# Patient Record
Sex: Male | Born: 1960 | Race: White | Hispanic: No | Smoking: Current every day smoker
Health system: Southern US, Community
[De-identification: ages and names within clinical notes are randomized; demographics above are authoritative.]

## PROBLEM LIST (undated history)

## (undated) DIAGNOSIS — T7840XA Allergy, unspecified, initial encounter: Secondary | ICD-10-CM

## (undated) DIAGNOSIS — I739 Peripheral vascular disease, unspecified: Secondary | ICD-10-CM

## (undated) DIAGNOSIS — J449 Chronic obstructive pulmonary disease, unspecified: Secondary | ICD-10-CM

## (undated) DIAGNOSIS — E785 Hyperlipidemia, unspecified: Secondary | ICD-10-CM

## (undated) DIAGNOSIS — K219 Gastro-esophageal reflux disease without esophagitis: Secondary | ICD-10-CM

## (undated) DIAGNOSIS — K649 Unspecified hemorrhoids: Secondary | ICD-10-CM

## (undated) DIAGNOSIS — F1990 Other psychoactive substance use, unspecified, uncomplicated: Secondary | ICD-10-CM

## (undated) DIAGNOSIS — I639 Cerebral infarction, unspecified: Secondary | ICD-10-CM

## (undated) DIAGNOSIS — IMO0001 Reserved for inherently not codable concepts without codable children: Secondary | ICD-10-CM

## (undated) DIAGNOSIS — J189 Pneumonia, unspecified organism: Secondary | ICD-10-CM

## (undated) DIAGNOSIS — M1990 Unspecified osteoarthritis, unspecified site: Secondary | ICD-10-CM

## (undated) DIAGNOSIS — T8859XA Other complications of anesthesia, initial encounter: Secondary | ICD-10-CM

## (undated) DIAGNOSIS — Z9049 Acquired absence of other specified parts of digestive tract: Secondary | ICD-10-CM

## (undated) DIAGNOSIS — IMO0002 Reserved for concepts with insufficient information to code with codable children: Secondary | ICD-10-CM

## (undated) DIAGNOSIS — M47816 Spondylosis without myelopathy or radiculopathy, lumbar region: Secondary | ICD-10-CM

## (undated) DIAGNOSIS — F1910 Other psychoactive substance abuse, uncomplicated: Secondary | ICD-10-CM

## (undated) DIAGNOSIS — C210 Malignant neoplasm of anus, unspecified: Secondary | ICD-10-CM

## (undated) DIAGNOSIS — I10 Essential (primary) hypertension: Secondary | ICD-10-CM

## (undated) DIAGNOSIS — F419 Anxiety disorder, unspecified: Secondary | ICD-10-CM

## (undated) HISTORY — DX: Spondylosis without myelopathy or radiculopathy, lumbar region: M47.816

## (undated) HISTORY — DX: Unspecified hemorrhoids: K64.9

## (undated) HISTORY — DX: Allergy, unspecified, initial encounter: T78.40XA

## (undated) HISTORY — DX: Malignant neoplasm of anus, unspecified: C21.0

## (undated) HISTORY — PX: APPENDECTOMY: SHX54

---

## 2000-01-22 ENCOUNTER — Emergency Department (HOSPITAL_COMMUNITY): Admission: EM | Admit: 2000-01-22 | Discharge: 2000-01-22 | Payer: Self-pay | Admitting: Emergency Medicine

## 2000-07-10 ENCOUNTER — Emergency Department (HOSPITAL_COMMUNITY): Admission: EM | Admit: 2000-07-10 | Discharge: 2000-07-10 | Payer: Self-pay | Admitting: Emergency Medicine

## 2000-07-13 ENCOUNTER — Ambulatory Visit (HOSPITAL_COMMUNITY): Admission: RE | Admit: 2000-07-13 | Discharge: 2000-07-13 | Payer: Self-pay | Admitting: Gynecology

## 2001-09-29 ENCOUNTER — Emergency Department (HOSPITAL_COMMUNITY): Admission: EM | Admit: 2001-09-29 | Discharge: 2001-09-29 | Payer: Self-pay

## 2002-07-15 ENCOUNTER — Encounter: Payer: Self-pay | Admitting: Emergency Medicine

## 2002-07-15 ENCOUNTER — Emergency Department (HOSPITAL_COMMUNITY): Admission: EM | Admit: 2002-07-15 | Discharge: 2002-07-15 | Payer: Self-pay | Admitting: Emergency Medicine

## 2002-11-25 ENCOUNTER — Emergency Department (HOSPITAL_COMMUNITY): Admission: EM | Admit: 2002-11-25 | Discharge: 2002-11-25 | Payer: Self-pay | Admitting: Emergency Medicine

## 2004-08-08 ENCOUNTER — Emergency Department (HOSPITAL_COMMUNITY): Admission: EM | Admit: 2004-08-08 | Discharge: 2004-08-08 | Payer: Self-pay | Admitting: Emergency Medicine

## 2004-08-19 ENCOUNTER — Emergency Department (HOSPITAL_COMMUNITY): Admission: EM | Admit: 2004-08-19 | Discharge: 2004-08-19 | Payer: Self-pay | Admitting: Family Medicine

## 2005-04-17 ENCOUNTER — Emergency Department (HOSPITAL_COMMUNITY): Admission: EM | Admit: 2005-04-17 | Discharge: 2005-04-17 | Payer: Self-pay | Admitting: Family Medicine

## 2005-09-12 ENCOUNTER — Emergency Department (HOSPITAL_COMMUNITY): Admission: EM | Admit: 2005-09-12 | Discharge: 2005-09-12 | Payer: Self-pay | Admitting: Family Medicine

## 2007-02-10 ENCOUNTER — Emergency Department (HOSPITAL_COMMUNITY): Admission: EM | Admit: 2007-02-10 | Discharge: 2007-02-10 | Payer: Self-pay | Admitting: Family Medicine

## 2007-05-05 ENCOUNTER — Ambulatory Visit: Payer: Self-pay | Admitting: Internal Medicine

## 2007-06-24 ENCOUNTER — Emergency Department (HOSPITAL_COMMUNITY): Admission: EM | Admit: 2007-06-24 | Discharge: 2007-06-24 | Payer: Self-pay | Admitting: Emergency Medicine

## 2007-07-04 ENCOUNTER — Emergency Department (HOSPITAL_COMMUNITY): Admission: EM | Admit: 2007-07-04 | Discharge: 2007-07-04 | Payer: Self-pay | Admitting: Family Medicine

## 2007-07-04 ENCOUNTER — Ambulatory Visit (HOSPITAL_COMMUNITY): Admission: RE | Admit: 2007-07-04 | Discharge: 2007-07-04 | Payer: Self-pay | Admitting: Family Medicine

## 2009-06-07 ENCOUNTER — Emergency Department (HOSPITAL_COMMUNITY): Admission: EM | Admit: 2009-06-07 | Discharge: 2009-06-07 | Payer: Self-pay | Admitting: Emergency Medicine

## 2010-05-05 ENCOUNTER — Emergency Department (HOSPITAL_COMMUNITY): Admission: EM | Admit: 2010-05-05 | Discharge: 2010-05-05 | Payer: Self-pay | Admitting: Family Medicine

## 2010-05-06 ENCOUNTER — Emergency Department (HOSPITAL_COMMUNITY): Admission: EM | Admit: 2010-05-06 | Discharge: 2010-05-06 | Payer: Self-pay | Admitting: Emergency Medicine

## 2010-10-05 ENCOUNTER — Emergency Department (HOSPITAL_COMMUNITY)
Admission: EM | Admit: 2010-10-05 | Discharge: 2010-10-05 | Payer: Self-pay | Source: Home / Self Care | Admitting: Emergency Medicine

## 2010-10-09 DIAGNOSIS — C210 Malignant neoplasm of anus, unspecified: Secondary | ICD-10-CM | POA: Insufficient documentation

## 2011-04-30 ENCOUNTER — Inpatient Hospital Stay (INDEPENDENT_AMBULATORY_CARE_PROVIDER_SITE_OTHER)
Admission: RE | Admit: 2011-04-30 | Discharge: 2011-04-30 | Disposition: A | Discharge status: Home / Self Care | Payer: Self-pay | Source: Ambulatory Visit | Attending: Family Medicine | Admitting: Family Medicine

## 2011-04-30 DIAGNOSIS — K4090 Unilateral inguinal hernia, without obstruction or gangrene, not specified as recurrent: Secondary | ICD-10-CM

## 2011-05-13 ENCOUNTER — Ambulatory Visit (INDEPENDENT_AMBULATORY_CARE_PROVIDER_SITE_OTHER): Payer: Self-pay | Admitting: General Surgery

## 2011-07-20 ENCOUNTER — Emergency Department (HOSPITAL_COMMUNITY)
Admission: EM | Admit: 2011-07-20 | Discharge: 2011-07-20 | Discharge status: Against Medical Advice | Payer: Self-pay | Attending: Emergency Medicine | Admitting: Emergency Medicine

## 2011-07-20 DIAGNOSIS — R109 Unspecified abdominal pain: Secondary | ICD-10-CM | POA: Insufficient documentation

## 2011-08-28 ENCOUNTER — Emergency Department (HOSPITAL_COMMUNITY): Payer: Self-pay

## 2011-08-28 ENCOUNTER — Encounter (HOSPITAL_COMMUNITY): Payer: Self-pay

## 2011-08-28 ENCOUNTER — Other Ambulatory Visit: Payer: Self-pay

## 2011-08-28 ENCOUNTER — Emergency Department (HOSPITAL_COMMUNITY)
Admission: EM | Admit: 2011-08-28 | Discharge: 2011-08-28 | Disposition: A | Discharge status: Home / Self Care | Payer: Self-pay | Attending: Emergency Medicine | Admitting: Emergency Medicine

## 2011-08-28 DIAGNOSIS — K649 Unspecified hemorrhoids: Secondary | ICD-10-CM | POA: Insufficient documentation

## 2011-08-28 DIAGNOSIS — K4090 Unilateral inguinal hernia, without obstruction or gangrene, not specified as recurrent: Secondary | ICD-10-CM | POA: Insufficient documentation

## 2011-08-28 DIAGNOSIS — R1013 Epigastric pain: Secondary | ICD-10-CM | POA: Insufficient documentation

## 2011-08-28 DIAGNOSIS — K6289 Other specified diseases of anus and rectum: Secondary | ICD-10-CM | POA: Insufficient documentation

## 2011-08-28 DIAGNOSIS — R079 Chest pain, unspecified: Secondary | ICD-10-CM | POA: Insufficient documentation

## 2011-08-28 LAB — URINALYSIS, ROUTINE W REFLEX MICROSCOPIC
Bilirubin Urine: NEGATIVE
Leukocytes, UA: NEGATIVE
Nitrite: NEGATIVE
Protein, ur: NEGATIVE mg/dL
Specific Gravity, Urine: 1.009 (ref 1.005–1.030)
Urobilinogen, UA: 0.2 mg/dL (ref 0.0–1.0)

## 2011-08-28 LAB — COMPREHENSIVE METABOLIC PANEL
AST: 14 U/L (ref 0–37)
BUN: 10 mg/dL (ref 6–23)
Calcium: 9.6 mg/dL (ref 8.4–10.5)
Creatinine, Ser: 0.89 mg/dL (ref 0.50–1.35)
Glucose, Bld: 92 mg/dL (ref 70–99)
Total Bilirubin: 0.3 mg/dL (ref 0.3–1.2)

## 2011-08-28 LAB — DIFFERENTIAL
Basophils Absolute: 0.1 10*3/uL (ref 0.0–0.1)
Eosinophils Absolute: 0.3 10*3/uL (ref 0.0–0.7)
Lymphs Abs: 3.4 10*3/uL (ref 0.7–4.0)
Monocytes Absolute: 0.9 10*3/uL (ref 0.1–1.0)
Neutro Abs: 11.4 10*3/uL — ABNORMAL HIGH (ref 1.7–7.7)
Neutrophils Relative %: 71 % (ref 43–77)

## 2011-08-28 LAB — CBC
Hemoglobin: 16.8 g/dL (ref 13.0–17.0)
MCH: 32.8 pg (ref 26.0–34.0)
MCHC: 36 g/dL (ref 30.0–36.0)
MCV: 91.2 fL (ref 78.0–100.0)
Platelets: 204 10*3/uL (ref 150–400)
RDW: 12.9 % (ref 11.5–15.5)

## 2011-08-28 LAB — POCT I-STAT TROPONIN I: Troponin i, poc: 0 ng/mL (ref 0.00–0.08)

## 2011-08-28 LAB — LIPASE, BLOOD: Lipase: 54 U/L (ref 11–59)

## 2011-08-28 MED ORDER — SODIUM CHLORIDE 0.9 % IV SOLN
Freq: Once | INTRAVENOUS | Status: AC
  Administered 2011-08-28: 12:00:00 via INTRAVENOUS

## 2011-08-28 MED ORDER — ONDANSETRON HCL 4 MG/2ML IJ SOLN
INTRAMUSCULAR | Status: AC
  Administered 2011-08-28: 4 mg via INTRAMUSCULAR
  Filled 2011-08-28: qty 2

## 2011-08-28 MED ORDER — FAMOTIDINE IN NACL 20-0.9 MG/50ML-% IV SOLN
INTRAVENOUS | Status: AC
  Filled 2011-08-28: qty 50

## 2011-08-28 MED ORDER — HYDROMORPHONE HCL PF 1 MG/ML IJ SOLN
INTRAMUSCULAR | Status: AC
  Filled 2011-08-28: qty 1

## 2011-08-28 MED ORDER — DIBUCAINE 1 % RE OINT
TOPICAL_OINTMENT | Freq: Four times a day (QID) | RECTAL | Status: DC | PRN
  Administered 2011-08-28: 14:00:00 via RECTAL
  Filled 2011-08-28: qty 28

## 2011-08-28 MED ORDER — ONDANSETRON HCL 4 MG/2ML IJ SOLN
4.0000 mg | Freq: Once | INTRAMUSCULAR | Status: AC
  Administered 2011-08-28: 4 mg via INTRAMUSCULAR

## 2011-08-28 MED ORDER — HYDROMORPHONE HCL PF 1 MG/ML IJ SOLN
1.0000 mg | Freq: Once | INTRAMUSCULAR | Status: AC
  Administered 2011-08-28: 1 mg via INTRAVENOUS

## 2011-08-28 MED ORDER — FAMOTIDINE IN NACL 20-0.9 MG/50ML-% IV SOLN
20.0000 mg | Freq: Once | INTRAVENOUS | Status: AC
  Administered 2011-08-28: 20 mg via INTRAVENOUS
  Filled 2011-08-28: qty 50

## 2011-08-28 MED ORDER — HYDROCODONE-ACETAMINOPHEN 5-325 MG PO TABS: 1.0000 | ORAL_TABLET | Freq: Four times a day (QID) | ORAL | Status: AC | PRN

## 2011-08-28 MED ORDER — STARCH 51 % RE SUPP: 1.0000 | Freq: Two times a day (BID) | RECTAL | Status: DC | PRN

## 2011-08-28 MED ORDER — DOXYCYCLINE HYCLATE 100 MG PO CAPS: 100.0000 mg | ORAL_CAPSULE | Freq: Two times a day (BID) | ORAL | Status: AC

## 2011-08-28 MED ORDER — IOHEXOL 300 MG/ML  SOLN: 100.0000 mL | Freq: Once | INTRAMUSCULAR | Status: DC | PRN

## 2011-08-28 NOTE — ED Notes (Signed)
Pt has finished his ct prep. Ct has been called.

## 2011-08-28 NOTE — ED Notes (Signed)
Pt here with epigastric pain for the past 2 days states that he has been straining while using the restroom and thinks that the pain has been coming from this.  Pt ambulatory around unit.

## 2011-08-28 NOTE — ED Provider Notes (Signed)
The patient was initially sent to the CDU while awaiting a CT scan of the abdomen and pelvis for further evaluation of a possible perirectal abscess. He has had rectal pain for the last month it has been increasing over the last couple of days. A CT scan was reviewed and shows no evidence of a perirectal abscess.   Ct Abdomen Pelvis W Contrast  08/28/2011  *RADIOLOGY REPORT*  Clinical Data: Difficulty with bowel movements, evaluate for possible perirectal abscess  CT ABDOMEN AND PELVIS WITH CONTRAST  Technique:  Multidetector CT imaging of the abdomen and pelvis was performed following the standard protocol during bolus administration of intravenous contrast.  Contrast:  100 ml Omnipaque-300  Comparison: None  Findings: There are vague opacities at the right lung base which may represent scarring.  Small foci of pneumonia cannot be excluded.  The left lung base is clear.  The liver enhances with no focal abnormality and no ductal dilatation is seen.  No calcified gallstones are noted.  The pancreas is normal in size and the pancreatic duct is not dilated.  The adrenal glands and spleen are unremarkable.  The stomach is moderately distended with fluid and is unremarkable.  The kidneys enhance with no calculus or mass and no hydronephrosis is noted.  The abdominal aorta is normal in caliber.  No adenopathy is seen.  The urinary bladder is moderately urine distended with no abnormality noted.  The prostate is normal in size.  No perirectal abscess is noted.  The colon is largely decompressed.  No colonic abnormality is seen.  The terminal ileum appears normal.  The appendix is not visualized.  No bony abnormality is seen.  There is degenerative disc disease at the L5 S1 level.  IMPRESSION:  1.  No perirectal abscess.  No abnormality of the colon is seen although the colon is diffusely decompressed. 2. Small patchy opacities within the right lower lobe may represent scarring, but foci of pneumonia cannot be  excluded.  Original Report Authenticated By: Joretta Bachelor, M.D.   Although the CT scan mentioned small patchy opacities that cannot be excluded as pneumonia, the patient has no fever, shortness of breath, or cough and I do not clinically suspect a pneumonia so he'll not be treated for this. Although there is no definitive perirectal abscess on CT scan, the patient's elevated white blood cell count with significant pain on rectal examination suggests that there could be a small abscess. He'll be treated with a course of doxycycline. The patient notes that he also has pain from a previously diagnosed inguinal hernia that he was seen for in August and is now able to see a general surgeon for this problem. The hernia only presents itself with maneuvers that increase his intra-abdominal pressure and there is no sign of incarceration. I will give him pain medication but advised that the surgeon is the appropriate provider for definitive treatment. He will be given Anusol suppositories and has been given instructions to use this as well as to became for his hemorrhoidal pain.  [place], [place] 08/28/11 1441

## 2011-08-28 NOTE — ED Notes (Signed)
Patient presents with tight, sharp, constant, epigastric/chest pain x 2 days.  Patient reports he's been straining during bowel movements and thinks this may have caused his pain.

## 2011-08-28 NOTE — ED Provider Notes (Signed)
Medical screening examination/treatment/procedure(s) were conducted as a shared visit with non-physician practitioner(s) and myself.  I personally evaluated the patient during the encounter.  Patient may have minimal perirectal abscess.  Rx doxycycline. no clinical evidence of pneumonia  Nat Christen, MD 08/28/11 1446

## 2011-09-05 ENCOUNTER — Encounter (HOSPITAL_COMMUNITY): Payer: Self-pay

## 2011-09-05 ENCOUNTER — Emergency Department (HOSPITAL_COMMUNITY)
Admission: EM | Admit: 2011-09-05 | Discharge: 2011-09-05 | Disposition: A | Discharge status: Home / Self Care | Payer: Self-pay | Attending: Emergency Medicine | Admitting: Emergency Medicine

## 2011-09-05 DIAGNOSIS — F17200 Nicotine dependence, unspecified, uncomplicated: Secondary | ICD-10-CM | POA: Insufficient documentation

## 2011-09-05 DIAGNOSIS — K648 Other hemorrhoids: Secondary | ICD-10-CM | POA: Insufficient documentation

## 2011-09-05 DIAGNOSIS — M549 Dorsalgia, unspecified: Secondary | ICD-10-CM | POA: Insufficient documentation

## 2011-09-05 DIAGNOSIS — R21 Rash and other nonspecific skin eruption: Secondary | ICD-10-CM | POA: Insufficient documentation

## 2011-09-05 DIAGNOSIS — K6289 Other specified diseases of anus and rectum: Secondary | ICD-10-CM | POA: Insufficient documentation

## 2011-09-05 DIAGNOSIS — Z79899 Other long term (current) drug therapy: Secondary | ICD-10-CM | POA: Insufficient documentation

## 2011-09-05 MED ORDER — DIPHENHYDRAMINE HCL 50 MG/ML IJ SOLN
25.0000 mg | Freq: Once | INTRAMUSCULAR | Status: AC
  Administered 2011-09-05: 25 mg via INTRAMUSCULAR
  Filled 2011-09-05: qty 1

## 2011-09-05 MED ORDER — HYDROCODONE-ACETAMINOPHEN 5-325 MG PO TABS: 1.0000 | ORAL_TABLET | Freq: Four times a day (QID) | ORAL | Status: DC | PRN

## 2011-09-05 MED ORDER — ANUSOL-HC 2.5 % RE CREA: TOPICAL_CREAM | RECTAL | Status: AC

## 2011-09-05 NOTE — ED Notes (Signed)
Pt here with burning in bottom, back pain, hernia pain, seen here Friday for same.

## 2011-09-05 NOTE — ED Provider Notes (Signed)
History     CSN: [id number]  Arrival date & time 09/05/11  77   First MD Initiated Contact with Patient 09/05/11 1541      Chief Complaint  Patient presents with  . Back Pain    (Consider location/radiation/quality/duration/timing/severity/associated sxs/prior treatment) Patient is a 50 y.o. male presenting with back pain. The history is provided by the patient (Patient complains of continued rectal pain. Patient has been diagnosed with internal hemorrhoids and referred to Gen. surgery. Patient has not been seen in general surgery. He had a CT scan of the abdomen and pelvis which was negative last week. Patient al).  Back Pain  This is a new problem. The current episode started more than 2 days ago. The problem occurs constantly. The problem has not changed since onset.The pain is associated with no known injury. Pain location: rectal pain. The pain is at a severity of 5/10. The pain is moderate. The pain is the same all the time. Pertinent negatives include no chest pain, no fever, no headaches and no abdominal pain. He has tried heat for the symptoms. The treatment provided mild relief.    Past Medical History  Diagnosis Date  . Inguinal hernia     Past Surgical History  Procedure Date  . Appendectomy     History reviewed. No pertinent family history.  History  Substance Use Topics  . Smoking status: Current Everyday Smoker  . Smokeless tobacco: Not on file  . Alcohol Use: Yes     occ      Review of Systems  Constitutional: Negative for fever and fatigue.  HENT: Negative for congestion, sinus pressure and ear discharge.   Eyes: Negative for discharge.  Respiratory: Negative for cough.   Cardiovascular: Negative for chest pain.  Gastrointestinal: Positive for rectal pain. Negative for abdominal pain and diarrhea.  Genitourinary: Negative for frequency and hematuria.  Musculoskeletal: Positive for back pain.  Skin: Positive for rash.  Neurological: Negative for  seizures and headaches.  Hematological: Negative.   Psychiatric/Behavioral: Negative for hallucinations.    Allergies  Aspirin and Morphine and related  Home Medications   Current Outpatient Rx  Name Route Sig Dispense Refill  . BC HEADACHE POWDER PO Oral Take 1 packet by mouth daily as needed. For headache    . DOXYCYCLINE HYCLATE 100 MG PO CAPS Oral Take 1 capsule (100 mg total) by mouth 2 (two) times daily. 14 capsule 0  . HYDROCODONE-ACETAMINOPHEN 5-325 MG PO TABS Oral Take 1-2 tablets by mouth every 6 (six) hours as needed for pain. 15 tablet 0  . HYDROCORTISONE 2.5 % RE CREA Rectal Place rectally 2 (two) times daily as needed. For hemorrhoids     . HYDROCORTISONE ACETATE 25 MG RE SUPP Rectal Place 25 mg rectally 2 (two) times daily. For pain     . ANUSOL-HC 2.5 % RE CREA  Apply rectally 2 times daily 1 Tube 1    Dispense as written.  Marland Kitchen HYDROCODONE-ACETAMINOPHEN 5-325 MG PO TABS Oral Take 1 tablet by mouth every 6 (six) hours as needed for pain. 30 tablet 0    BP 175/98  Pulse 95  Temp(Src) 98.1 F (36.7 C) (Oral)  Resp 20  SpO2 97%  Physical Exam  Constitutional: He is oriented to person, place, and time. He appears well-developed.  HENT:  Head: Normocephalic and atraumatic.  Eyes: Conjunctivae and EOM are normal. No scleral icterus.  Neck: Neck supple. No thyromegaly present.  Cardiovascular: Normal rate and regular rhythm.  Exam reveals  no gallop and no friction rub.   No murmur heard. Pulmonary/Chest: No stridor. He has no wheezes. He has no rales. He exhibits no tenderness.  Abdominal: He exhibits no distension. There is no tenderness. There is no rebound.  Genitourinary:       Rectal exam,  Tender internal hemm.  Musculoskeletal: Normal range of motion. He exhibits no edema.  Lymphadenopathy:    He has no cervical adenopathy.  Neurological: He is oriented to person, place, and time. Coordination normal.  Skin: Rash noted. There is erythema.       Allergic  dermatitis to chest.  Psychiatric: He has a normal mood and affect. His behavior is normal.    ED Course  Procedures (including critical care time)  Labs Reviewed - No data to display No results found.   1. Rectal pain       MDM  Internal hemm.  And allergic reaction to doxycyclne        Maudry Diego, MD 09/05/11 [phone]

## 2011-09-05 NOTE — ED Notes (Signed)
Pt complains of apin in bottom and body aches, sts recently diagnosed with hemmorrhoids. Also has a hernia per pt report.

## 2011-09-11 ENCOUNTER — Ambulatory Visit (INDEPENDENT_AMBULATORY_CARE_PROVIDER_SITE_OTHER): Payer: Self-pay | Admitting: General Surgery

## 2011-09-11 ENCOUNTER — Encounter (INDEPENDENT_AMBULATORY_CARE_PROVIDER_SITE_OTHER): Payer: Self-pay | Admitting: General Surgery

## 2011-09-11 VITALS — BP 162/118 | HR 84 | Temp 97.2°F | Resp 20 | Ht 71.0 in | Wt 167.5 lb

## 2011-09-11 DIAGNOSIS — K648 Other hemorrhoids: Secondary | ICD-10-CM | POA: Insufficient documentation

## 2011-09-11 MED ORDER — OXYCODONE-ACETAMINOPHEN 5-325 MG PO TABS: 1.0000 | ORAL_TABLET | Freq: Four times a day (QID) | ORAL | Status: DC | PRN

## 2011-09-11 NOTE — Progress Notes (Signed)
Subjective:     Patient ID: ADOLPHUS HANF, male   DOB: 06-21-1961, 51 y.o.   MRN: [id number]  HPI Patient believes one month hemorrhoid complaints. He has pain with bowel movements. He has prolapse of some tissue and some mild spotting of blood. He has been having some constipation lately.  Review of Systems     Objective:   Physical Exam External anal exam reveals no external hemorrhoids. There is no evidence of infection or abscess. There is no fissure. Digital rectal exam reveals a internal hemorrhoid over on the left side. Anoscopy was then done. This is somewhat painful to the patient. Hemorrhoid appeared to close to the anal verge for banding. I was able to do injection of sclerosing solution up above where it was sensate. He tolerated this well. There was no bleeding.    Assessment:     Inflamed internal hemorrhoid    Plan:     This was injected with sclerosing solution as described above. Advised him to drink any water to avoid constipation. I gave him a small prescription of Percocet. He tolerates this well but cannot take vicodin. I will see him back. This may require further office procedures. I strongly advised him to see a primary care MD about his BP. It is likely elevated now due to pain.

## 2011-09-15 ENCOUNTER — Ambulatory Visit (INDEPENDENT_AMBULATORY_CARE_PROVIDER_SITE_OTHER): Payer: Self-pay | Admitting: Surgery

## 2011-09-15 ENCOUNTER — Encounter (INDEPENDENT_AMBULATORY_CARE_PROVIDER_SITE_OTHER): Payer: Self-pay | Admitting: Surgery

## 2011-09-15 DIAGNOSIS — K6289 Other specified diseases of anus and rectum: Secondary | ICD-10-CM

## 2011-09-15 DIAGNOSIS — K648 Other hemorrhoids: Secondary | ICD-10-CM

## 2011-09-15 MED ORDER — HYDROCORTISONE ACETATE 25 MG RE SUPP: 25.0000 mg | Freq: Two times a day (BID) | RECTAL | Status: AC

## 2011-09-15 NOTE — Patient Instructions (Signed)
Try suppositories for 10 days.  If no relief, call office at [phone]. tmg

## 2011-09-15 NOTE — Progress Notes (Signed)
Visit Diagnoses: 1. Internal hemorrhoid   2. Rectal pain     HISTORY: The patient is a 51 year old male seen one week ago by my partner for rectal pain. He was diagnosed with internal hemorrhoids. He was treated with injection sclerotherapy. Patient returns today with persistent complaints of pain. He denies rectal bleeding. He did have some relief with suppositories. He has had no relief with topical creams. He denies any significant bleeding. Patient denies any prior rectal surgery.  PERTINENT REVIEW OF SYSTEMS: No significant bleeding. Persistent pain.  EXAM: HEENT: normocephalic; pupils equal and reactive; sclerae clear; dentition good; mucous membranes moist NECK:  symmetric on extension; no palpable anterior or posterior cervical lymphadenopathy; no supraclavicular masses; no tenderness GU:  External exam normal; no fissure, no fistula, no thrombosis; markedly tender right anterior anal canal; no mass; no blood EXT:  non-tender without edema; no deformity NEURO: no gross focal deficits; no sign of tremor   IMPRESSION: Anorectal pain, possible thrombosed hemorrhoidal vein, no evidence fissure or neoplasm  PLAN: Patient will begin Anusol-HC suppositories twice daily for 10 days. He did achieve a level of symptomatic relief from this treatment previously. He is so uncomfortable with the examination here in the office, then I think if he needs another examination and needs to be performed under anesthesia. Patient will contact us in 10-12 days and let us know about his progress. I have encouraged him to remain on stool softeners and drink plenty of fluids. I did renew a prescription for Percocet and gave him 20 tablets. I would not give him any further narcotics.  We will await the report from the patient about his progress and then make a decision regarding further therapy.  Earnstine Regal, MD, [facility]

## 2011-09-18 ENCOUNTER — Telehealth (INDEPENDENT_AMBULATORY_CARE_PROVIDER_SITE_OTHER): Payer: Self-pay

## 2011-09-18 NOTE — Telephone Encounter (Signed)
C/o rectal burning. Patient using baby wipes, creams and soaking his rectal area. He has been advised no more narcotics and is willing to be examined under anesthesia.   Please place the orders ASAP.

## 2011-09-19 ENCOUNTER — Telehealth (INDEPENDENT_AMBULATORY_CARE_PROVIDER_SITE_OTHER): Payer: Self-pay

## 2011-09-19 NOTE — Telephone Encounter (Signed)
Patient call back with rectal burning- asked for more pain medicine- follow up directions given to him same as yesterday- He was advised to go to the ER if he can not wait for our office to place orders.

## 2011-09-22 ENCOUNTER — Telehealth (INDEPENDENT_AMBULATORY_CARE_PROVIDER_SITE_OTHER): Payer: Self-pay

## 2011-09-22 ENCOUNTER — Other Ambulatory Visit (INDEPENDENT_AMBULATORY_CARE_PROVIDER_SITE_OTHER): Payer: Self-pay | Admitting: Surgery

## 2011-09-22 DIAGNOSIS — K6289 Other specified diseases of anus and rectum: Secondary | ICD-10-CM

## 2011-09-22 DIAGNOSIS — K648 Other hemorrhoids: Secondary | ICD-10-CM

## 2011-09-22 MED ORDER — OXYCODONE-ACETAMINOPHEN 5-325 MG PO TABS: 1.0000 | ORAL_TABLET | Freq: Four times a day (QID) | ORAL | Status: DC | PRN

## 2011-09-22 NOTE — Telephone Encounter (Signed)
Message left on voicemail for patient to return call for orders to be placed for examination under anesthesia.    On second phone call patient stated yes, please place orders as soon as possible.

## 2011-09-22 NOTE — Telephone Encounter (Signed)
Patient called wanting more pain medication. I told them there was note saying no more pain medication. Insisted I page Dr to see if some oxycodone could be prescribed.

## 2011-09-22 NOTE — Progress Notes (Signed)
Patient with persistent symptoms.  Desires to proceed with exam under anesthesia as discussed at his last office visit. tmg

## 2011-09-22 NOTE — Telephone Encounter (Signed)
Dr Harlow Asa oked the refill. Patient told it is at front desk to pick up. Also told patient that this will not be refilled again before surgery so he needed to stretch the medicine out as long as possible.

## 2011-10-07 ENCOUNTER — Telehealth (INDEPENDENT_AMBULATORY_CARE_PROVIDER_SITE_OTHER): Payer: Self-pay

## 2011-10-07 NOTE — Telephone Encounter (Signed)
Pt called requesting refill of narcotic pain med.. Per 09-22-11 note in system per Dr Harlow Asa no more narcotic rx refills until after surgery. Pt advised to try otc pain meds as directed on bottle. Pt to keep surgery date as planned.

## 2011-10-08 ENCOUNTER — Encounter (HOSPITAL_COMMUNITY): Payer: Self-pay | Admitting: Pharmacy Technician

## 2011-10-10 DIAGNOSIS — T7840XA Allergy, unspecified, initial encounter: Secondary | ICD-10-CM | POA: Insufficient documentation

## 2011-10-10 HISTORY — DX: Allergy, unspecified, initial encounter: T78.40XA

## 2011-10-13 ENCOUNTER — Ambulatory Visit (HOSPITAL_COMMUNITY)
Admission: RE | Admit: 2011-10-13 | Discharge: 2011-10-13 | Disposition: A | Discharge status: Home / Self Care | Payer: Self-pay | Source: Ambulatory Visit | Attending: Surgery | Admitting: Surgery

## 2011-10-13 ENCOUNTER — Encounter (HOSPITAL_COMMUNITY): Payer: Self-pay

## 2011-10-13 ENCOUNTER — Encounter (HOSPITAL_COMMUNITY)
Admission: RE | Admit: 2011-10-13 | Discharge: 2011-10-13 | Disposition: A | Discharge status: Home / Self Care | Payer: Self-pay | Source: Ambulatory Visit | Attending: Surgery | Admitting: Surgery

## 2011-10-13 DIAGNOSIS — F17200 Nicotine dependence, unspecified, uncomplicated: Secondary | ICD-10-CM | POA: Insufficient documentation

## 2011-10-13 DIAGNOSIS — R05 Cough: Secondary | ICD-10-CM | POA: Insufficient documentation

## 2011-10-13 DIAGNOSIS — Z01818 Encounter for other preprocedural examination: Secondary | ICD-10-CM | POA: Insufficient documentation

## 2011-10-13 DIAGNOSIS — Z01812 Encounter for preprocedural laboratory examination: Secondary | ICD-10-CM | POA: Insufficient documentation

## 2011-10-13 DIAGNOSIS — R059 Cough, unspecified: Secondary | ICD-10-CM | POA: Insufficient documentation

## 2011-10-13 DIAGNOSIS — I10 Essential (primary) hypertension: Secondary | ICD-10-CM | POA: Insufficient documentation

## 2011-10-13 HISTORY — DX: Pneumonia, unspecified organism: J18.9

## 2011-10-13 HISTORY — DX: Unspecified osteoarthritis, unspecified site: M19.90

## 2011-10-13 HISTORY — DX: Essential (primary) hypertension: I10

## 2011-10-13 HISTORY — DX: Gastro-esophageal reflux disease without esophagitis: K21.9

## 2011-10-13 LAB — BASIC METABOLIC PANEL
Chloride: 100 mEq/L (ref 96–112)
GFR calc Af Amer: >90 mL/min (ref >90)
Potassium: 4.2 mEq/L (ref 3.5–5.1)

## 2011-10-13 LAB — CBC
Platelets: 186 10*3/uL (ref 150–400)
RDW: 12.8 % (ref 11.5–15.5)
WBC: 11.7 10*3/uL — ABNORMAL HIGH (ref 4.0–10.5)

## 2011-10-13 LAB — SURGICAL PCR SCREEN: Staphylococcus aureus: NEGATIVE

## 2011-10-13 NOTE — Patient Instructions (Addendum)
[place]  10/13/2011   Your procedure is scheduled on: 10/14/11   Surgery [id number] Report to [place] at [phone] AM.  Call this number if you have problems the morning of surgery: [phone]        Remember:   Do not eat food:After Midnight.tonight  May have clear liquids:until Midnight .tonight  Clear liquids include soda, tea, black coffee, apple or grape juice, broth.  Take these medicines the morning of surgery with A SIP OF WATER:Percocet if needed with sip water  Do not wear jewelry, make-up or nail polish.  Do not wear lotions, powders, or perfumes. You may wear deodorant.  Do not shave 48 hours prior to surgery.  Do not bring valuables to the hospital.  Contacts, dentures or bridgework may not be worn into surgery.  Leave suitcase in the car. After surgery it may be brought to your room.  For patients admitted to the hospital, checkout time is 11:00 AM the day of discharge.              FLEETS ENEMA TONIGHT AND IN AM PER RECTUM  Patients discharged the day of surgery will not be allowed to drive home.  Name and phone number of your driver:Frank   friend Special Instructions: CHG Shower Use Special Wash: 1/2 bottle night before surgery and 1/2 bottle morning of surgery. Regular soap face and privates   Please read over the following fact sheets that you were given: MRSA Information

## 2011-10-13 NOTE — Pre-Procedure Instructions (Signed)
Instructed no more aspirin, antiinflammatories before surgery

## 2011-10-14 ENCOUNTER — Encounter (HOSPITAL_COMMUNITY): Payer: Self-pay | Admitting: *Deleted

## 2011-10-14 ENCOUNTER — Encounter (HOSPITAL_COMMUNITY)
Admission: RE | Disposition: A | Discharge status: Home / Self Care | Payer: Self-pay | Source: Ambulatory Visit | Attending: Surgery

## 2011-10-14 ENCOUNTER — Other Ambulatory Visit (INDEPENDENT_AMBULATORY_CARE_PROVIDER_SITE_OTHER): Payer: Self-pay | Admitting: Surgery

## 2011-10-14 ENCOUNTER — Ambulatory Visit (HOSPITAL_COMMUNITY): Payer: Self-pay | Admitting: *Deleted

## 2011-10-14 ENCOUNTER — Ambulatory Visit (HOSPITAL_BASED_OUTPATIENT_CLINIC_OR_DEPARTMENT_OTHER): Admission: RE | Admit: 2011-10-14 | Payer: Self-pay | Source: Ambulatory Visit | Admitting: Surgery

## 2011-10-14 ENCOUNTER — Encounter (HOSPITAL_COMMUNITY): Payer: Self-pay | Admitting: Surgery

## 2011-10-14 ENCOUNTER — Ambulatory Visit (HOSPITAL_COMMUNITY)
Admission: RE | Admit: 2011-10-14 | Discharge: 2011-10-14 | Disposition: A | Discharge status: Home / Self Care | Payer: Self-pay | Source: Ambulatory Visit | Attending: Surgery | Admitting: Surgery

## 2011-10-14 ENCOUNTER — Encounter (HOSPITAL_BASED_OUTPATIENT_CLINIC_OR_DEPARTMENT_OTHER): Admission: RE | Payer: Self-pay | Source: Ambulatory Visit

## 2011-10-14 DIAGNOSIS — K648 Other hemorrhoids: Secondary | ICD-10-CM | POA: Insufficient documentation

## 2011-10-14 DIAGNOSIS — C211 Malignant neoplasm of anal canal: Secondary | ICD-10-CM | POA: Insufficient documentation

## 2011-10-14 DIAGNOSIS — K6289 Other specified diseases of anus and rectum: Secondary | ICD-10-CM

## 2011-10-14 DIAGNOSIS — C210 Malignant neoplasm of anus, unspecified: Secondary | ICD-10-CM

## 2011-10-14 DIAGNOSIS — C801 Malignant (primary) neoplasm, unspecified: Secondary | ICD-10-CM | POA: Insufficient documentation

## 2011-10-14 HISTORY — PX: OTHER SURGICAL HISTORY: SHX169

## 2011-10-14 HISTORY — DX: Malignant neoplasm of anus, unspecified: C21.0

## 2011-10-14 HISTORY — PX: EXAMINATION UNDER ANESTHESIA: SHX1540

## 2011-10-14 SURGERY — EXAM UNDER ANESTHESIA
Anesthesia: General | Wound class: Clean Contaminated

## 2011-10-14 SURGERY — EXAM UNDER ANESTHESIA
Anesthesia: General

## 2011-10-14 MED ORDER — ONDANSETRON HCL 4 MG/2ML IJ SOLN
INTRAMUSCULAR | Status: DC | PRN
  Administered 2011-10-14: 4 mg via INTRAVENOUS

## 2011-10-14 MED ORDER — PROMETHAZINE HCL 25 MG/ML IJ SOLN: 6.2500 mg | INTRAMUSCULAR | Status: DC | PRN

## 2011-10-14 MED ORDER — FENTANYL CITRATE 0.05 MG/ML IJ SOLN
50.0000 ug | INTRAMUSCULAR | Status: DC | PRN
  Administered 2011-10-14 (×2): 50 ug via INTRAVENOUS

## 2011-10-14 MED ORDER — OXYCODONE-ACETAMINOPHEN 5-325 MG PO TABS: 1.0000 | ORAL_TABLET | ORAL | Status: DC | PRN

## 2011-10-14 MED ORDER — PROPOFOL 10 MG/ML IV EMUL
INTRAVENOUS | Status: DC | PRN
  Administered 2011-10-14: 150 mg via INTRAVENOUS

## 2011-10-14 MED ORDER — LACTATED RINGERS IV SOLN
INTRAVENOUS | Status: DC
  Administered 2011-10-14: 1000 mL via INTRAVENOUS

## 2011-10-14 MED ORDER — 0.9 % SODIUM CHLORIDE (POUR BTL) OPTIME
TOPICAL | Status: DC | PRN
  Administered 2011-10-14: 1000 mL

## 2011-10-14 MED ORDER — FENTANYL CITRATE 0.05 MG/ML IJ SOLN: 25.0000 ug | INTRAMUSCULAR | Status: DC | PRN

## 2011-10-14 MED ORDER — FENTANYL CITRATE 0.05 MG/ML IJ SOLN
INTRAMUSCULAR | Status: DC | PRN
  Administered 2011-10-14: 50 ug via INTRAVENOUS

## 2011-10-14 MED ORDER — BUPIVACAINE LIPOSOME 1.3 % IJ SUSP: 20.0000 mL | INTRAMUSCULAR | Status: DC

## 2011-10-14 MED ORDER — DEXAMETHASONE SODIUM PHOSPHATE 4 MG/ML IJ SOLN
INTRAMUSCULAR | Status: DC | PRN
  Administered 2011-10-14: 10 mg via INTRAVENOUS

## 2011-10-14 MED ORDER — OXYCODONE-ACETAMINOPHEN 5-325 MG PO TABS
ORAL_TABLET | ORAL | Status: AC
  Administered 2011-10-14: 1
  Filled 2011-10-14: qty 1

## 2011-10-14 MED ORDER — MIDAZOLAM HCL 5 MG/5ML IJ SOLN
INTRAMUSCULAR | Status: DC | PRN
  Administered 2011-10-14: 2 mg via INTRAVENOUS

## 2011-10-14 MED ORDER — LIDOCAINE HCL (CARDIAC) 20 MG/ML IV SOLN
INTRAVENOUS | Status: DC | PRN
  Administered 2011-10-14: 100 mg via INTRAVENOUS

## 2011-10-14 MED ORDER — ERTAPENEM SODIUM 1 G IJ SOLR
1.0000 g | INTRAMUSCULAR | Status: AC
  Administered 2011-10-14: 1 g via INTRAVENOUS
  Filled 2011-10-14: qty 1

## 2011-10-14 MED ORDER — BUPIVACAINE LIPOSOME 1.3 % IJ SUSP
20.0000 mL | Freq: Once | INTRAMUSCULAR | Status: AC
  Administered 2011-10-14: 20 mL
  Filled 2011-10-14: qty 20

## 2011-10-14 MED ORDER — FENTANYL CITRATE 0.05 MG/ML IJ SOLN
INTRAMUSCULAR | Status: AC
  Filled 2011-10-14: qty 2

## 2011-10-14 MED ORDER — ACETAMINOPHEN 10 MG/ML IV SOLN
INTRAVENOUS | Status: DC | PRN
  Administered 2011-10-14: 1000 mg via INTRAVENOUS

## 2011-10-14 MED ORDER — ACETAMINOPHEN 10 MG/ML IV SOLN
INTRAVENOUS | Status: AC
  Filled 2011-10-14: qty 100

## 2011-10-14 SURGICAL SUPPLY — 30 items
BLADE HEX COATED 2.75 (ELECTRODE) ×2 IMPLANT
BLADE SURG 15 STRL LF DISP TIS (BLADE) ×1 IMPLANT
BLADE SURG 15 STRL SS (BLADE) ×1
CLOTH BEACON ORANGE TIMEOUT ST (SAFETY) ×2 IMPLANT
DECANTER SPIKE VIAL GLASS SM (MISCELLANEOUS) ×2 IMPLANT
DRAIN PENROSE 18X1/2 LTX STRL (DRAIN) IMPLANT
DRSG PAD ABDOMINAL 8X10 ST (GAUZE/BANDAGES/DRESSINGS) ×2 IMPLANT
ELECT REM PT RETURN 9FT ADLT (ELECTROSURGICAL) ×2
ELECTRODE REM PT RTRN 9FT ADLT (ELECTROSURGICAL) ×1 IMPLANT
GAUZE SPONGE 4X4 16PLY XRAY LF (GAUZE/BANDAGES/DRESSINGS) ×2 IMPLANT
GLOVE BIOGEL PI IND STRL 7.0 (GLOVE) ×1 IMPLANT
GLOVE BIOGEL PI INDICATOR 7.0 (GLOVE) ×1
GOWN STRL NON-REIN LRG LVL3 (GOWN DISPOSABLE) ×2 IMPLANT
GOWN STRL REIN XL XLG (GOWN DISPOSABLE) ×4 IMPLANT
KIT BASIN OR (CUSTOM PROCEDURE TRAY) ×2 IMPLANT
LUBRICANT JELLY K Y 4OZ (MISCELLANEOUS) ×2 IMPLANT
NDL SAFETY ECLIPSE 18X1.5 (NEEDLE) ×1 IMPLANT
NEEDLE HYPO 18GX1.5 SHARP (NEEDLE) ×1
NEEDLE HYPO 25X1 1.5 SAFETY (NEEDLE) ×2 IMPLANT
NS IRRIG 1000ML POUR BTL (IV SOLUTION) ×2 IMPLANT
PACK LITHOTOMY IV (CUSTOM PROCEDURE TRAY) IMPLANT
PENCIL BUTTON HOLSTER BLD 10FT (ELECTRODE) ×2 IMPLANT
SPONGE GAUZE 4X4 12PLY (GAUZE/BANDAGES/DRESSINGS) ×2 IMPLANT
SPONGE SURGIFOAM ABS GEL 12-7 (HEMOSTASIS) IMPLANT
SUT CHROMIC 2 0 SH (SUTURE) IMPLANT
SUT CHROMIC 3 0 SH 27 (SUTURE) ×2 IMPLANT
SYR CONTROL 10ML LL (SYRINGE) ×2 IMPLANT
TOWEL OR 17X26 10 PK STRL BLUE (TOWEL DISPOSABLE) ×2 IMPLANT
UNDERPAD 30X30 INCONTINENT (UNDERPADS AND DIAPERS) ×2 IMPLANT
YANKAUER SUCT BULB TIP 10FT TU (MISCELLANEOUS) ×2 IMPLANT

## 2011-10-14 NOTE — Anesthesia Postprocedure Evaluation (Signed)
  Anesthesia Post-op Note  Patient: Martin [name]  Procedure(s) Performed:  EXAM UNDER ANESTHESIA - exam under anethesia, Excision of mass anal canal, 1.5cm  Patient Location: PACU  Anesthesia Type: General  Level of Consciousness: oriented and sedated  Airway and Oxygen Therapy: Patient Spontanous Breathing  Post-op Pain: none  Post-op Assessment: Post-op Vital signs reviewed, Patient's Cardiovascular Status Stable, Respiratory Function Stable and Patent Airway  Post-op Vital Signs: stable  Complications: No apparent anesthesia complications

## 2011-10-14 NOTE — Preoperative (Signed)
Beta Blockers   Reason not to administer Beta Blockers:Not Applicable 

## 2011-10-14 NOTE — Transfer of Care (Signed)
Immediate Anesthesia Transfer of Care Note  Patient: Martin [name]  Procedure(s) Performed:  EXAM UNDER ANESTHESIA - exam under anethesia, Excision of mass anal canal, 1.5cm  Patient Location: PACU  Anesthesia Type: General  Level of Consciousness: awake, alert  and oriented  Airway & Oxygen Therapy: Patient Spontanous Breathing and Patient connected to face mask oxygen  Post-op Assessment: Report given to PACU RN and Post -op Vital signs reviewed and stable  Post vital signs: Reviewed and stable  Complications: No apparent anesthesia complications

## 2011-10-14 NOTE — Interval H&P Note (Signed)
History and Physical Interval Note:  10/14/2011 8:58 AM  Martin [name]  has presented today for surgery, with the diagnosis of anal/rectal pain.  The various methods of treatment have been discussed with the patient and family. After consideration of risks, benefits and other options for treatment, the patient has consented to    Procedure(s): EXAM UNDER ANESTHESIA as a surgical intervention .    The patients' history has been reviewed, patient examined, no change in status, stable for surgery.  I have reviewed the patients' chart and labs.  Questions were answered to the patient's satisfaction.    Earnstine Regal, MD, [facility]   Martin [name]

## 2011-10-14 NOTE — Brief Op Note (Signed)
10/14/2011  12:13 PM  PATIENT:  Martin [name]  51 y.o. male  PRE-OPERATIVE DIAGNOSIS:  anal/rectal pain  POST-OPERATIVE DIAGNOSIS:  anal/rectal pain, 1.5 cm mass at right dentate line  PROCEDURE:  Procedure(s): 1. EXAM UNDER ANESTHESIA 2. TRANSANAL EXCISION OF ANORECTAL MASS, 1.5 cm  SURGEON:  Surgeon(s): Earnstine Regal, MD, FACS  ASSISTANTS: none   ANESTHESIA:   general  EBL:  Total I/O In: 800 [I.V.:800] Out: 25 [Blood:25]  BLOOD ADMINISTERED:none  DRAINS: none   LOCAL MEDICATIONS USED:  OTHER - Exparel 20 cc  SPECIMEN:  Excision  DISPOSITION OF SPECIMEN:  PATHOLOGY  COUNTS:  YES  DICTATION: .Other Dictation: Dictation Number [id number]  PLAN OF CARE: Discharge to home after PACU  PATIENT DISPOSITION:  PACU - hemodynamically stable.   Earnstine Regal, MD, [facility]

## 2011-10-14 NOTE — Op Note (Signed)
Martin [name], Martin [name]               ACCOUNT NO.:  [id number]  MEDICAL RECORD NO.:  [id number]  LOCATION:  [facility]                         FACILITY:  [facility]  PHYSICIAN:  Earnstine Regal, MD      DATE OF BIRTH:  1961-09-03  DATE OF PROCEDURE:  10/14/2011                               OPERATIVE REPORT   PREOPERATIVE DIAGNOSES:  Anorectal pain.  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  PROCEDURES: 1. Examination under anesthesia. 2. 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  SURGEON:  Earnstine Regal, MD, FACS  ANESTHESIA:  General.  ANESTHESIOLOGIST:  Karsten Ro, M.D.  ESTIMATED BLOOD LOSS:  Minimal.  PREPARATION:  Betadine.  COMPLICATIONS:  None.  INDICATIONS:  The patient is a 51 year old white male, seen at our surgical practice for rectal pain.  He initially underwent injection sclerotherapy.  The patient returned to the practice with persistent complaints of pain and denied bleeding.  Attempts at anoscopy in the office were unsuccessful due to the patient's discomfort.  The patient was treated empirically with steroid suppositories without symptomatic improvement.  The patient now comes to the operating room for examination under anesthesia.  BODY OF REPORT:  Procedure was done in OR #11 at the [facility].  The patient was brought to the operating room, placed in a supine position on the operating room table.  Following administration of general anesthesia, the patient was positioned in lithotomy with Yellofin stirrups, and prepped and draped in the usual strict aseptic fashion.  After ascertaining that an adequate level of anesthesia had been achieved, digital rectal examination was performed. Anus was inspected circumferentially.  There was no sign of fistula or fissure.  There was minimal prolapse.  However, there was a palpable mass at the dentate line on the right.  This is mobile.  It measured 1.5 cm  in greatest dimension.  Allis clamp was used to retract the anoderm. Using Exparel local anesthetic, a bleb was raised beneath the mass, separating it from the underlying sphincter musculature.  Using the electrocautery for hemostasis, the mass was excised.  It appeared to go into the anoderm and mucosa, but does not extend into the underlying muscular wall.  It was excised in its entirety and submitted to Pathology for review.  Defect was closed with interrupted 3-0 chromic gut, interrupted simple sutures.  Good hemostasis was noted.  Further local anesthetic was infiltrated around the operative site for a total of 20 cc.  Digital rectal exam was again performed.  No other masses are noted.  No other abnormalities were identified.  There were grade 1 internal hemorrhoids, but no sign of active bleeding.  Dry dressings were applied.  The patient was taken out lithotomy and awakened from anesthesia.  The patient was brought to the recovery room.  The patient tolerated the procedure well.   Earnstine Regal, MD, FACS   TMG/MEDQ  D:  10/14/2011  T:  10/14/2011  Job:  [id number]

## 2011-10-14 NOTE — H&P (View-Only) (Signed)
Visit Diagnoses: 1. Internal hemorrhoid   2. Rectal pain     HISTORY: The patient is a 51 year old male seen one week ago by my partner for rectal pain. He was diagnosed with internal hemorrhoids. He was treated with injection sclerotherapy. Patient returns today with persistent complaints of pain. He denies rectal bleeding. He did have some relief with suppositories. He has had no relief with topical creams. He denies any significant bleeding. Patient denies any prior rectal surgery.  PERTINENT REVIEW OF SYSTEMS: No significant bleeding. Persistent pain.  EXAM: HEENT: normocephalic; pupils equal and reactive; sclerae clear; dentition good; mucous membranes moist NECK:  symmetric on extension; no palpable anterior or posterior cervical lymphadenopathy; no supraclavicular masses; no tenderness GU:  External exam normal; no fissure, no fistula, no thrombosis; markedly tender right anterior anal canal; no mass; no blood EXT:  non-tender without edema; no deformity NEURO: no gross focal deficits; no sign of tremor   IMPRESSION: Anorectal pain, possible thrombosed hemorrhoidal vein, no evidence fissure or neoplasm  PLAN: Patient will begin Anusol-HC suppositories twice daily for 10 days. He did achieve a level of symptomatic relief from this treatment previously. He is so uncomfortable with the examination here in the office, then I think if he needs another examination and needs to be performed under anesthesia. Patient will contact us in 10-12 days and let us know about his progress. I have encouraged him to remain on stool softeners and drink plenty of fluids. I did renew a prescription for Percocet and gave him 20 tablets. I would not give him any further narcotics.  We will await the report from the patient about his progress and then make a decision regarding further therapy.  Earnstine Regal, MD, [facility]

## 2011-10-14 NOTE — Anesthesia Preprocedure Evaluation (Addendum)
Anesthesia Evaluation  Patient identified by MRN, date of birth, ID band Patient awake    Reviewed: Allergy & Precautions, H&P , NPO status , Patient's Chart, lab work & pertinent test results, reviewed documented beta blocker date and time   Airway Mallampati: II TM Distance: >3 FB Neck ROM: Full    Dental  (+) Dental Advisory Given   Pulmonary Current Smoker,  clear to auscultation        Cardiovascular hypertension, neg cardio ROS Regular Normal No Rx for BP   Neuro/Psych Negative Neurological ROS  Negative Psych ROS   GI/Hepatic negative GI ROS, Neg liver ROS,   Endo/Other  Negative Endocrine ROS  Renal/GU negative Renal ROS  Genitourinary negative   Musculoskeletal negative musculoskeletal ROS (+)   Abdominal   Peds negative pediatric ROS (+)  Hematology negative hematology ROS (+)   Anesthesia Other Findings Missing teeth  Reproductive/Obstetrics negative OB ROS                           Anesthesia Physical Anesthesia Plan  ASA: II  Anesthesia Plan: General   Post-op Pain Management:    Induction: Intravenous  Airway Management Planned: LMA  Additional Equipment:   Intra-op Plan:   Post-operative Plan: Extubation in OR  Informed Consent: I have reviewed the patients History and Physical, chart, labs and discussed the procedure including the risks, benefits and alternatives for the proposed anesthesia with the patient or authorized representative who has indicated his/her understanding and acceptance.     Plan Discussed with: CRNA and Surgeon  Anesthesia Plan Comments:         Anesthesia Quick Evaluation

## 2011-10-14 NOTE — Progress Notes (Signed)
Patient states compliant with bowel prep as directed by doctor

## 2011-10-20 ENCOUNTER — Other Ambulatory Visit (INDEPENDENT_AMBULATORY_CARE_PROVIDER_SITE_OTHER): Payer: Self-pay | Admitting: Surgery

## 2011-10-20 DIAGNOSIS — C20 Malignant neoplasm of rectum: Secondary | ICD-10-CM

## 2011-10-21 ENCOUNTER — Encounter: Payer: Self-pay | Admitting: *Deleted

## 2011-10-21 NOTE — Progress Notes (Signed)
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 Armandina Gemma  Widowed,,3 daughters,unemployed, no family hx cancer    Allergies:Asa=swelling,Codeine=nausea only, Morphine=itching

## 2011-10-22 ENCOUNTER — Ambulatory Visit: Payer: Self-pay

## 2011-10-22 ENCOUNTER — Ambulatory Visit: Payer: Self-pay | Admitting: Radiation Oncology

## 2011-10-22 ENCOUNTER — Encounter: Payer: Self-pay | Admitting: *Deleted

## 2011-10-22 ENCOUNTER — Ambulatory Visit
Admission: RE | Admit: 2011-10-22 | Discharge: 2011-10-22 | Disposition: A | Discharge status: Home / Self Care | Payer: Self-pay | Source: Ambulatory Visit | Attending: Radiation Oncology | Admitting: Radiation Oncology

## 2011-10-22 ENCOUNTER — Telehealth: Payer: Self-pay | Admitting: Radiation Oncology

## 2011-10-22 ENCOUNTER — Telehealth: Payer: Self-pay | Admitting: Oncology

## 2011-10-22 ENCOUNTER — Encounter: Payer: Self-pay | Admitting: Radiation Oncology

## 2011-10-22 VITALS — BP 155/97 | HR 92 | Temp 97.4°F | Resp 20 | Ht 71.0 in | Wt 165.1 lb

## 2011-10-22 DIAGNOSIS — C210 Malignant neoplasm of anus, unspecified: Secondary | ICD-10-CM | POA: Insufficient documentation

## 2011-10-22 DIAGNOSIS — Z510 Encounter for antineoplastic radiation therapy: Secondary | ICD-10-CM | POA: Insufficient documentation

## 2011-10-22 DIAGNOSIS — K219 Gastro-esophageal reflux disease without esophagitis: Secondary | ICD-10-CM | POA: Insufficient documentation

## 2011-10-22 DIAGNOSIS — C801 Malignant (primary) neoplasm, unspecified: Secondary | ICD-10-CM

## 2011-10-22 DIAGNOSIS — I10 Essential (primary) hypertension: Secondary | ICD-10-CM | POA: Insufficient documentation

## 2011-10-22 HISTORY — DX: Anxiety disorder, unspecified: F41.9

## 2011-10-22 MED ORDER — OXYCODONE-ACETAMINOPHEN 5-325 MG PO TABS: 1.0000 | ORAL_TABLET | ORAL | Status: DC | PRN

## 2011-10-22 MED ORDER — OXYCODONE-ACETAMINOPHEN 5-325 MG PO TABS: 1.0000 | ORAL_TABLET | Freq: Four times a day (QID) | ORAL | Status: DC | PRN

## 2011-10-22 NOTE — Progress Notes (Signed)
Please see the Nurse Progress Note in the MD Initial Consult Encounter for this patient. 

## 2011-10-22 NOTE — Progress Notes (Signed)
CC:   Earnstine Regal, MD Ladell Pier, M.D.  REFERRING PHYSICIAN:  Earnstine Regal, MD  DIAGNOSIS:  Squamous cell carcinoma of the anus.  HISTORY OF PRESENT ILLNESS:  Martin [name] is very pleasant 51 year old gentleman who is seen out the courtesy of Dr. Harlow Asa for an opinion concerning radiation therapy as part of the management of the patient's recently diagnosed squamous cell carcinoma of the anus.  Martin [name] presented with a 2 month history of anorectal discomfort, particularly with bowel movements.  He also noticed some problems with rectal bleeding.  The patient initially had some symptomatic relief with topical agents.  However, his pain persisted and the patient was subsequently seen by Dr. Harlow Asa.  In light of the patient's discomfort, a good exam in the clinic was not possible.  The patient was subsequently taken to the operating room on October 14, 2011 at which time the patient underwent exam under anesthesia and transanal excision of an anorectal mass which measured 1.5 cm.  This mass was palpable intraoperatively and was at the dentate line on the right.  It did appear to be mobile and as above, measured 1.5 cm in greatest dimension. The mass was removed intraoperatively.  Upon pathologic review, the patient was found have an invasive moderate to poorly differentiated squamous cell carcinoma.  The pathologic surgical margins were involved. Prior to the patient's surgery as above, he did have a chest x-ray which showed increase in perihilar markings with some peribronchial thickening, but no evidence of metastasis.  In addition, the patient did undergo a CT scan of the abdomen and pelvis on August 28, 2011, which showed no obvious mass in the pelvis or metastatic spread.  The patient is now seen in Radiation Oncology for consultation and consideration for treatment.  ALLERGIES:  The patient has an allergy to aspirin, codeine and morphine. The patient is able to take  oxycodone without difficulty.  PAST MEDICAL HISTORY:  Significant for history of gastroesophageal reflux disease, a history of hypertension.  PAST SURGICAL HISTORY:  Appendectomy at age 33 or 85.  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  SOCIAL HISTORY:  The patient is currently living with his mother.  He is unemployed at this time.  The patient smokes 1-1/2 pack cigarettes per day and has done so for the past 40 years.  The patient has alcohol intake, primarily on the weekends, approximately a 6-pack of beer per weekend.  The patient has occasional use of marijuana.  The patient is widowed.  His wife died of metastatic breast cancer several years ago. The patient has 3 daughters.  FAMILY HISTORY:  No immediate family history of malignancy.  REVIEW OF SYSTEMS:  As above, the patient presented with significant pain in the anorectal area.  Since the patient's surgery, this pain has improved somewhat.  The patient does have some mild rectal bleeding since his surgery.  The patient denies any new bony pain, headaches, dizziness or blurred vision.  The patient denies any urination difficulties.  He complains of a poor "sex drive" at this time. Complete review of systems is undertaken with the patient and other than the above mentioned issues is unremarkable.  PHYSICAL EXAMINATION:  General:  This is a pleasant middle-aged gentleman in no acute distress.  Vital signs:  Temperature 97.4, pulse 92, blood pressure is elevated at 155/97.  I have recommended that the patient obtain a primary care physician for  management of this issue. Patient's height is 5 feet 11 inches.  Weight is 165 pounds. Examination of the pupils reveals them to be equal, round and reactive to light.  The extraocular eye movements are intact.  The tongue is midline.  There is no secondary infection noted in the oral cavity or posterior pharynx.   Examination of the neck and supraclavicular region reveals no evidence of adenopathy.  The axillary areas are free of adenopathy.  Examination of the lungs reveals them to be clear.  The heart has a regular rhythm and rate.  The abdomen is soft and nontender with normal bowel sounds.  There is no obvious hepatosplenomegaly.  The inguinal areas are free of adenopathy.  The patient does admit to a possible hernia along the left side, but I am unable to identify this issue sitting or standing at this time.  The patient is a normal circumcised male.  The testicles are soft without masses.  On visual inspection of the anus, there are no external hemorrhoids noted.  A digital exam is not performed in light of the patient's recent surgery. Examination of the extremities reveals no appreciable edema.  Peripheral pulses are good.  On neurological examination,  motor strength is 5/5 in the proximal and distal muscle groups of the upper and lower extremities.  LABORATORY DATA:  From February 4th white count 11.7, hemoglobin 15.2, hematocrit 43.6, platelet count is 186,000.  Potassium 4.2, BUN 12, creatinine 0.88.  PATHOLOGY:  Anal resection, canal:  Invasive moderate to poorly- differentiated squamous cell carcinoma.  The cauterized deep and lateral margins were positive.  The depth of invasion was noted to be 0.7 cm. The tumor on pathologic evaluation measures 0.8 cm from the glass slide. There was no evidence of angiolymphatic invasion or perineural invasion.  X-RAY STUDIES:  As summarized in the HPI.  IMPRESSION/PLAN:  Squamous cell carcinoma of the anus.  I discussed the pathologic findings with Martin [name].  We discussed general recommendation for management of this tumor.  Given the usually good results with radiation and radiosensitizing chemotherapy, I would not recommend abdominoperineal resection at this time.  Prior to proceeding with treatment, I would like to complete the patient's  staging workup with a PET scan.  The patient will be set up to see Dr. Benay Spice in Medical Oncology in the near future for further evaluation.    ______________________________ Blair Promise, Ph.D., M.D. JDK/MEDQ  D:  10/22/2011  T:  10/22/2011  Job:  [id number]

## 2011-10-22 NOTE — Telephone Encounter (Signed)
Pt has no insurance and was sent a financial assistance application via mail to complete and returrn on next appt.  10/30/2011: Pt has a EPP and MCD pending with [facility], Med Onc

## 2011-10-22 NOTE — Progress Notes (Signed)
[place] Psychosocial Distress Screening Clinical Social Work  Clinical Social Work was referred by distress screening protocol.  The patient scored a 10 on the Psychosocial Distress Thermometer which indicates severe distress. Clinical Social Worker met with patient in radiation oncology exam room to assess for distress and other psychosocial needs. The patient states he is currently feeling overwhelmed.  The patient reports that his first wife passed away of breast cancer in 1997 and she underwent aggressive chemo and radiation treatment.  The patient fears his experience will be similar and discussing cancer brings back sad memories of his spouse.  The patient identifies his three daughters and his parents as his support system.  CSW and patient discussed radiation and chemotherapy process.  CSW encouraged patient to contact her after he has begun treatment if he continues to experience heightened anxiety and hopelessness.   Patient currently unemployed and uninsured.  CSW requested patient be seen by financial advocates at next appointment to apply for [facility] assistance.  At this time, it is unclear if patient would be eligible for SSDI, CSW requested patient contact her at a later time if he would be interested in applying for SSDI once treatment has began.   Clinical Social Worker follow up needed: no  If yes, follow up plan: Patient plans to follow up.  Polo Riley, MSW, [place] Worker [facility] [phone]

## 2011-10-22 NOTE — Telephone Encounter (Signed)
S/w pt today re appt for 2/21 @ 1:30 pm w/BS. Per 2/12 pof next wk by 2/20 w/LO or BS. LO's next availability is now the wk of 2/26 and BS has the 1st available @ 2/21. Message sent to renee/tiffany/amy re change in provider.

## 2011-10-24 ENCOUNTER — Telehealth: Payer: Self-pay | Admitting: *Deleted

## 2011-10-28 ENCOUNTER — Ambulatory Visit (INDEPENDENT_AMBULATORY_CARE_PROVIDER_SITE_OTHER): Payer: Self-pay | Admitting: Surgery

## 2011-10-28 ENCOUNTER — Encounter (INDEPENDENT_AMBULATORY_CARE_PROVIDER_SITE_OTHER): Payer: Self-pay | Admitting: Surgery

## 2011-10-28 DIAGNOSIS — C210 Malignant neoplasm of anus, unspecified: Secondary | ICD-10-CM

## 2011-10-28 DIAGNOSIS — C801 Malignant (primary) neoplasm, unspecified: Secondary | ICD-10-CM

## 2011-10-28 NOTE — Patient Instructions (Signed)
Continue on stool softeners.  May use Analpram 2.5% HC cream topically for burning or itching.  tmg

## 2011-10-28 NOTE — Progress Notes (Signed)
Visit Diagnoses: 1. Anal cancer   2. Cancer     HISTORY: Patient returns for her first postoperative visit having undergone excision of squamous cell carcinoma of the anus. Patient has been seen by radiation oncology and will see medical oncology later this week. He has a PET scan scheduled later this week.  EXAM: External examination shows a small sentinel tag on the posterior right anoderm. Eversion shows healing surgical wound. Digital exam shows normal anal without palpable mass. No sign of bleeding.  IMPRESSION: Squamous cell carcinoma of the anus  PLAN: Patient will see medical oncology later this week. He will have his PET scan performed. He will likely begin treatment in the coming weeks. He will return to see me for followup in 4 weeks.  Earnstine Regal, MD, [facility]

## 2011-10-29 ENCOUNTER — Encounter (INDEPENDENT_AMBULATORY_CARE_PROVIDER_SITE_OTHER): Payer: Self-pay | Admitting: General Surgery

## 2011-10-30 ENCOUNTER — Ambulatory Visit (HOSPITAL_BASED_OUTPATIENT_CLINIC_OR_DEPARTMENT_OTHER): Payer: Self-pay | Admitting: Oncology

## 2011-10-30 ENCOUNTER — Telehealth: Payer: Self-pay | Admitting: Oncology

## 2011-10-30 ENCOUNTER — Ambulatory Visit: Payer: Self-pay

## 2011-10-30 ENCOUNTER — Other Ambulatory Visit: Payer: Self-pay | Admitting: Lab

## 2011-10-30 ENCOUNTER — Telehealth: Payer: Self-pay | Admitting: *Deleted

## 2011-10-30 ENCOUNTER — Encounter: Payer: Self-pay | Admitting: Oncology

## 2011-10-30 VITALS — BP 148/93 | HR 77 | Temp 99.0°F | Ht 71.0 in | Wt 168.0 lb

## 2011-10-30 DIAGNOSIS — C210 Malignant neoplasm of anus, unspecified: Secondary | ICD-10-CM

## 2011-10-30 NOTE — Progress Notes (Signed)
Patient came in today as a new patient, and he stated that he has no insurance,so i gave him a medicaid application and an Epp application to fill out and return to me.

## 2011-10-30 NOTE — Progress Notes (Signed)
Referring MD: Georgianne Fick 51 y.o.  11-04-1960    Reason for Referral: New diagnosis of anal cancer     HPI: He reports a 2 month history of "straining "with bowel movements and burning at the anus. He was seen in the emergency room on several occasions. After the last emergency room visit he was referred to Dr. Harlow Asa. There was tenderness at the right anterior anal canal. No mass was palpated. The pain persisted and he was taken to examination under anesthesia 06/12/2012. A mass was palpated at the dentate line on the right. The mass was mobile. It measured 1.5 cm. The mass was excised. No other masses were seen.  The pathology ([id number]) confirmed an invasive moderate to poorly differentiated squamous cell carcinoma. The margins of resection were involved with tumor. He saw Dr. Harlow Asa for a followup visit on 10/28/2011. A digital exam showed no palpable anal mass.  He saw Dr. Sondra Come on 10/22/2011 and a staging PET scan was scheduled. This has not been completed.   Past Medical History  Diagnosis Date  . Inguinal hernia   . GERD (gastroesophageal reflux disease)   . Hypertension     EKG 12/12 EPIC   no  PCP- states increased lately but hasnt been diagnosed formally  . Pneumonia     as child, cough at present with no fever  . Hemorrhoid     internal  . Cancer 10/14/11    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   . Allergy   . Anxiety   . Arthritis     DDD lumbar, arthritis knees  . DJD (degenerative joint disease) of lumbar spine   . Anal cancer 10/2011    Past Surgical History  Procedure Date  . Transanal excision 10/14/2011    Dr.Todd Gerkin  . Surgical pathology  10/14/2011    squamous cell ca of anus  . Appendectomy     age 36 or 4    Family history: He has 2 brothers. No family history of cancer.  Current outpatient prescriptions:docusate sodium (COLACE) 100 MG capsule, Take 100 mg by mouth 2 (two) times daily as needed. For constipation, Disp:  , Rfl: ;  oxyCODONE-acetaminophen (ROXICET) 5-325 MG per tablet, Take 1-2 tablets by mouth every 6 (six) hours as needed for pain., Disp: 30 tablet, Rfl: 0;  psyllium (METAMUCIL) 0.52 G capsule, Take 0.52 g by mouth 2 (two) times daily., Disp: , Rfl:   Allergies:  Allergies  Allergen Reactions  . Aspirin Swelling  . Codeine Nausea Only  . Morphine And Related Itching    Social History: He lives with his girlfriend in [place]. His first wife died of breast cancer. He has 3 daughters. He works in [employer]. He smokes cigarettes. He drinks alcohol on weekends. He uses marijuana in the past. He does not use IV drugs. He denies risk factors for HIV and hepatitis. 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    History  Alcohol Use  . Yes    weekend    6pk weekend, beer     History  Smoking status  . Current Everyday Smoker -- 1.5 packs/day for 41 years  . Types: Cigarettes  Smokeless tobacco  . Never Used     ROS:   Positives include: Intermittent cough, urinary hesitancy, decreased visual acuity, pain at the anus prior to surgery  A complete ROS was otherwise negative.  Physical Exam:  Blood pressure 148/93, pulse  77, temperature 99 F (37.2 C), temperature source Oral, height 5' 11"  (1.803 m), weight 168 lb (76.204 kg).  HEENT: Multiple missing teeth. Oropharynx without mass. Neck without mass. Lungs: Clear bilaterally Cardiac: Regular rate and rhythm Abdomen: No hepatomegaly, no mass, no splenomegaly, 3-4 mm nodular area at the right superior anal verge. No discrete mass. Rectal examination was deferred due to recent examinations and surgery. GU: Circumcised male, testes without mass,? Left inguinal hernia palpated via the scrotum  Vascular: No leg edema Lymph nodes: No cervical, supraclavicular, axillary, or inguinal lymph nodes Neurologic: Alert and oriented. The motor examination appears grossly intact Skin: Multiple  tattoos   LAB:  CBC  Lab Results  Component Value Date   WBC 11.7* 10/13/2011   HGB 15.2 10/13/2011   HCT 43.6 10/13/2011   MCV 91.4 10/13/2011   PLT 186 10/13/2011     CMP      Component Value Date/Time   NA 137 10/13/2011 1410   K 4.2 10/13/2011 1410   CL 100 10/13/2011 1410   CO2 26 10/13/2011 1410   GLUCOSE 90 10/13/2011 1410   BUN 12 10/13/2011 1410   CREATININE 0.88 10/13/2011 1410   CALCIUM 9.8 10/13/2011 1410   PROT 7.6 08/28/2011 1045   ALBUMIN 4.1 08/28/2011 1045   AST 14 08/28/2011 1045   ALT 13 08/28/2011 1045   ALKPHOS 101 08/28/2011 1045   BILITOT 0.3 08/28/2011 1045   GFRNONAA >90 10/13/2011 1410   GFRAA >90 10/13/2011 1410     Radiology:  Chest x-ray febrile fourth 2013- minimal hyperinflation CT of the abdomen and pelvis on 08/28/2011 no focal abnormality of the liver no adenopathy, degenerative disc disease at L5-S1  Assessment/Plan:   1. Squamous cell carcinoma of the anal canal  2. Tobacco use  3. Degenerative disc disease   Disposition:   He has been diagnosed with squamous cell carcinoma of the anus. He underwent a transanal excision of the anal canal mass in the surgical margins are positive.  I discussed the treatment of anal cancer with Mr.Bryant. He has seen Dr.Kinard. He will complete a staging PET scan within the next one week.  I recommend treatment with concurrent chemotherapy and radiation. We discussed standard therapy with 5-fluorouracil/mitomycin-C and concurrent radiation.  I reviewed the potential toxicities associated with the chemotherapy regimen including the chance for nausea/vomiting, mucositis, diarrhea, alopecia, and hematologic toxicity. We discussed the hemorrhagic cystitis and hemolytic uremic syndrome associated with mitomycin-C. We discussed the skin rash, skin hyperpigmentation, and hand/foot syndrome associated with 5-fluorouracil.  He will attend chemotherapy teaching class.  The plan is to begin concurrent chemotherapy and  radiation on 11/17/2011. He will be referred for a PICC on 11/17/2011.  He will return for an office visit and nadir CBC on 11/28/2011.  We obtained a baseline CBC, chemistry panel, and HIV serology today.  [place] 10/30/2011, 3:51 PM

## 2011-10-30 NOTE — Telephone Encounter (Signed)
appts made and printed for  all appts in pof   aom

## 2011-10-30 NOTE — Telephone Encounter (Signed)
xxx

## 2011-10-30 NOTE — Progress Notes (Signed)
Met with pt to assess needs and offer assistance.  Pt given contact information and encouraged to call if needed.  Reviewed pt's schedule as pt is still waiting to have PET to complete staging workup.  Referrals made to dietician and SW. Will cont. to follow.

## 2011-10-31 ENCOUNTER — Encounter: Payer: Self-pay | Admitting: *Deleted

## 2011-10-31 NOTE — Telephone Encounter (Signed)
XXXX

## 2011-10-31 NOTE — Progress Notes (Signed)
[place] Brief Psychosocial Assessment Clinical Social Work  Clinical Social Work was referred by patient navigator for assessment of psychosocial needs.  Clinical Social Worker contacted patient at home to offer support and asses for needs.  Pt stated he was experiencing increased stress due to his diagnosis, financial situation, lack of insurance, and his girlfriend having back surgery next week.  CSW validated the pt's concerns and offered additional support.  Pt is currently uninsured, but has met with the financial counselor and is in the process of completing the Medicaid and EPP application.  CSW and pt discussed the importance of emotional support during treatment.  CSW informed pt of the supportive services at [facility], and encouraged him to call if he continued to feel overwhelmed or anxious.  Pt stated that he would feel less stress once the insurance and financial situation was addressed.  CSW provided pt with contact information and encouraged him to call if he or his family had and needs or concerns.            Patient identified barriers to care as : Insurance family concerns financial concerns   Johnnye Lana, MSW, [place] Worker [facility] [phone]

## 2011-11-03 ENCOUNTER — Other Ambulatory Visit: Payer: Self-pay | Admitting: *Deleted

## 2011-11-03 ENCOUNTER — Other Ambulatory Visit: Payer: Self-pay

## 2011-11-03 ENCOUNTER — Ambulatory Visit (HOSPITAL_BASED_OUTPATIENT_CLINIC_OR_DEPARTMENT_OTHER): Payer: Self-pay | Admitting: Lab

## 2011-11-03 DIAGNOSIS — C210 Malignant neoplasm of anus, unspecified: Secondary | ICD-10-CM

## 2011-11-03 LAB — HIV ANTIBODY (ROUTINE TESTING W REFLEX): HIV: NONREACTIVE

## 2011-11-03 LAB — CBC WITH DIFFERENTIAL/PLATELET
Eosinophils Absolute: 0.4 10*3/uL (ref 0.0–0.5)
LYMPH%: 30.8 % (ref 14.0–49.0)
MCH: 32.2 pg (ref 27.2–33.4)
MCHC: 34.4 g/dL (ref 32.0–36.0)
MCV: 93.5 fL (ref 79.3–98.0)
MONO#: 0.5 10*3/uL (ref 0.1–0.9)
NEUT%: 59.5 % (ref 39.0–75.0)

## 2011-11-03 LAB — COMPREHENSIVE METABOLIC PANEL
CO2: 26 mEq/L (ref 19–32)
Calcium: 9.1 mg/dL (ref 8.4–10.5)
Creatinine, Ser: 0.99 mg/dL (ref 0.50–1.35)
Glucose, Bld: 109 mg/dL — ABNORMAL HIGH (ref 70–99)
Total Bilirubin: 0.3 mg/dL (ref 0.3–1.2)
Total Protein: 6.3 g/dL (ref 6.0–8.3)

## 2011-11-03 MED ORDER — PROCHLORPERAZINE MALEATE 10 MG PO TABS: 10.0000 mg | ORAL_TABLET | Freq: Four times a day (QID) | ORAL | Status: DC | PRN

## 2011-11-03 MED ORDER — HYDROCODONE-ACETAMINOPHEN 5-500 MG PO CAPS: 1.0000 | ORAL_CAPSULE | ORAL | Status: DC | PRN

## 2011-11-03 NOTE — Telephone Encounter (Signed)
Patient requesting pain medication.

## 2011-11-04 ENCOUNTER — Other Ambulatory Visit: Payer: Self-pay | Admitting: *Deleted

## 2011-11-04 DIAGNOSIS — C210 Malignant neoplasm of anus, unspecified: Secondary | ICD-10-CM

## 2011-11-04 MED ORDER — OXYCODONE-ACETAMINOPHEN 5-325 MG PO TABS: 1.0000 | ORAL_TABLET | ORAL | Status: DC | PRN

## 2011-11-04 NOTE — Telephone Encounter (Signed)
Patient notified that Percocet script is ready for pick up. Confirmed he has met with financial counselor and has his paperwork completed and will bring with him to next appointment.

## 2011-11-04 NOTE — Telephone Encounter (Signed)
Reports the hydrocodone/apap generic cost him $32.00 and he can't afford this--has no insurance. He had been on Percocet 5/325 and this was much less expensive. Requests MD refill this script. Takes 4-6/day for pain control. OK per Dr. Benay Spice. Will follow up with financial counselor to see if he can get assistance with his meds and/or file for what he may qualify for.

## 2011-11-05 ENCOUNTER — Other Ambulatory Visit: Payer: Self-pay

## 2011-11-06 ENCOUNTER — Encounter (HOSPITAL_COMMUNITY)
Admission: RE | Admit: 2011-11-06 | Discharge: 2011-11-06 | Disposition: A | Discharge status: Home / Self Care | Payer: Self-pay | Source: Ambulatory Visit | Attending: Radiation Oncology | Admitting: Radiation Oncology

## 2011-11-06 ENCOUNTER — Encounter: Payer: Self-pay | Admitting: Oncology

## 2011-11-06 ENCOUNTER — Ambulatory Visit: Payer: Self-pay | Admitting: Nutrition

## 2011-11-06 ENCOUNTER — Encounter (HOSPITAL_COMMUNITY): Payer: Self-pay | Admitting: Surgery

## 2011-11-06 DIAGNOSIS — I2510 Atherosclerotic heart disease of native coronary artery without angina pectoris: Secondary | ICD-10-CM | POA: Insufficient documentation

## 2011-11-06 DIAGNOSIS — C801 Malignant (primary) neoplasm, unspecified: Secondary | ICD-10-CM

## 2011-11-06 DIAGNOSIS — C210 Malignant neoplasm of anus, unspecified: Secondary | ICD-10-CM | POA: Insufficient documentation

## 2011-11-06 DIAGNOSIS — K314 Gastric diverticulum: Secondary | ICD-10-CM | POA: Insufficient documentation

## 2011-11-06 LAB — GLUCOSE, CAPILLARY: Glucose-Capillary: 85 mg/dL (ref 70–99)

## 2011-11-06 MED ORDER — FLUDEOXYGLUCOSE F - 18 (FDG) INJECTION
18.8000 | Freq: Once | INTRAVENOUS | Status: AC | PRN
  Administered 2011-11-06: 18.8 via INTRAVENOUS

## 2011-11-06 NOTE — Assessment & Plan Note (Signed)
REASON FOR ASSESSMENT:  Martin [name] is a 51 year old patient of Dr. Benay Spice diagnosed with anal cancer to receive concurrent chemoradiation therapy.  MEDICAL HISTORY INCLUDES:  Hernia, GERD, hypertension, anxiety, arthritis, degenerative joint disease and tobacco.  MEDICATIONS INCLUDE:  Colace, roxicet and metamucil.  Patient will be receiving mitomycin C and 5-FU.  LABS:  Reviewed.  HEIGHT:  5 feet 11 inches. WEIGHT:  168 pounds. USUAL BODY WEIGHT:  about 162 pounds. BMI:  23.44.  PATIENT STATES:  He now lives with his mother.  He reports a poor appetite and recent constipation which he attributes to not taking his fiber and stool softener.  He also reports that his girlfriend is in the hospital and he has been on the go, trying to travel back and forth between his doctor appointments and her inpatient stay and so he has not been eating or drinking or taking medications as he needs to.  Patient reports intolerance to greasy, fried food.  NUTRITION DIAGNOSIS:  Food and nutrition related knowledge deficit related to new diagnosis of anal cancer and associated treatments as evidenced by no prior need for nutrition related information.  INTERVENTION:  I educated Martin [name] on the importance of consuming small frequent meals with high-protein foods at each meal or snack.  I have encouraged adequate calories to promote weight maintenance.  I have educated him on strategies for eating with constipation versus diarrhea. I have recommended more fruits, vegetables, and whole grains as tolerated.  I have encouraged him to begin nutritional supplements such as Ensure or Boost as needed between meals, especially if he is not eating a balanced diet.  I provided samples and fact sheets for the patient today along with my contact information.  MONITORING/EVALUATION (GOALS):  The patient will tolerate oral diet for minimal side effects and for weight maintenance.  NEXT VISIT:  Monday March  11th.    ______________________________ Ernestene Kiel, RD, LDN Clinical Nutrition Specialist BN/MEDQ  D:  11/06/2011  T:  11/06/2011  Job:  [id number]

## 2011-11-06 NOTE — Progress Notes (Signed)
Patient approve for 100% Discount  11/06/11 - 05/05/12.

## 2011-11-07 ENCOUNTER — Other Ambulatory Visit: Payer: Self-pay | Admitting: Radiation Oncology

## 2011-11-07 ENCOUNTER — Other Ambulatory Visit (HOSPITAL_COMMUNITY): Payer: Self-pay

## 2011-11-10 ENCOUNTER — Ambulatory Visit
Admission: RE | Admit: 2011-11-10 | Discharge: 2011-11-10 | Disposition: A | Discharge status: Home / Self Care | Payer: Self-pay | Source: Ambulatory Visit | Attending: Radiation Oncology | Admitting: Radiation Oncology

## 2011-11-10 ENCOUNTER — Encounter: Payer: Self-pay | Admitting: Radiation Oncology

## 2011-11-10 VITALS — BP 152/91 | HR 88 | Temp 99.1°F | Resp 20

## 2011-11-10 DIAGNOSIS — C210 Malignant neoplasm of anus, unspecified: Secondary | ICD-10-CM

## 2011-11-10 MED ORDER — SODIUM CHLORIDE 0.9 % IJ SOLN
10.0000 mL | Freq: Once | INTRAMUSCULAR | Status: AC
  Administered 2011-11-10: 10 mL via INTRAVENOUS

## 2011-11-10 NOTE — Progress Notes (Signed)
Removed patients Iv site, 4x4 Gause and taped over RAC , instructed patient to keep dressing on for 2-3 hours, if when removing  Blood starts,put pressure back on and redressing ,patient gave verbal understanding 8:45 AM

## 2011-11-10 NOTE — Progress Notes (Signed)
Met with patient to discuss RO billing. Pt has been apprvd for EPP 100% and has completed a MCD appl, that will be sent to [place] via mail.  Dx: 154.3 Anus, NOS (excludes skin of anus and perianal skin T-  Attending Rad: Dr. Donnamae Jude Tx:  [phone] x [phone] IMRT  11/11/2011:  Pt has concerns for transportation needs. He has No Insurance, but has been approved for EPP. I have referred pt to  Polo Riley, MSW, P-LCSW Clinical Social Worker, at this time.

## 2011-11-10 NOTE — Progress Notes (Signed)
Pt arrived ambulatory ,steady gait,alert and oriented x 3, identification name and dob, here for IV start for ct sim, not diabeteic stated pt, Iv site RAC, #22g 1 inch catheter needle started x 1,excellent blood return, op site over site and taped, vitals taken,  7:42 AM

## 2011-11-11 ENCOUNTER — Encounter: Payer: Self-pay | Admitting: *Deleted

## 2011-11-11 NOTE — Progress Notes (Signed)
DIAGNOSIS:  Anal carcinoma.  NARRATIVE:  Earlier today Mr. Cozby underwent treatment planning to begin radiation therapy directed at the pelvis and inguinal area.  The patient was placed on the CT simulator table.  A customized Alpha cradle was developed for positioning of the pelvis for treatment.  The patient then proceeded to have an IV placed with IV contrast administered to aid in nodal identification and primary tumor site.  The patient then proceeded to undergo CT scan in the treatment position.  Under virtual simulation, the anal area was outlined as well as the selective nodal areas.  The patient had development of a PTV 5040 cGy, which will encompass the primary site.  The patient then had development of a PTVn 4200 cGy, which will encompass the elective nodal areas.  The patient will be treated with helical IMRT on the tomotherapy unit.  6 MV photons will be used to deliver the treatment.  PLAN:  The patient is to receive daily radiation treatments at 180 cGy per day for 28 treatments for a cumulative dose to the target area of 5040 cGy.  The patient will undergo daily megavoltage CT scans for accurate setup and treatment.  In addition, the patient will receive radiosensitizing chemotherapy throughout his course of treatment.    ______________________________ Blair Promise, Ph.D., M.D. JDK/MEDQ  D:  11/10/2011  T:  11/11/2011  Job:  [id number]

## 2011-11-11 NOTE — Progress Notes (Signed)
Clinical Social Worker contacted pt to follow up regarding his questions about applying for disability and Medicaid.  Based on the previous note from [place], Estate manager/land agent the pt has completed his application, and has been submitted to DSS.  CSW discussed the disability application process with pt, and pt agreed that once his Medicaid application was processed he would move forward with applying for disability.  Pt stated he was doing well and experiencing less stress now that his financial matters were being addressed.  CSW encouraged pt to call when he receives information from [employer], and with any other questions or concerns.  Johnnye Lana, MSW, [place] Worker [facility] [phone]

## 2011-11-12 ENCOUNTER — Encounter: Payer: Self-pay | Admitting: *Deleted

## 2011-11-12 ENCOUNTER — Telehealth: Payer: Self-pay | Admitting: Oncology

## 2011-11-12 NOTE — Progress Notes (Signed)
Clinical Social Work received referral from Development worker, community for transportation assistance.  CSW contacted pt to assess for needs.  Pt stated he currently has transportation, but wanted to explore his options "incase he needed to used them".  CSW informed pt of the ACS- Road to Recovery program and one time gas card.  Pt agreed to [place] making a referral to ACS.  CSW and pt also discussed transportation benefits he could receive if he is approved for Medicaid.  CSW encouraged pt to call with any questions or concerns.    Johnnye Lana, MSW, [place] Worker [facility] [phone]

## 2011-11-12 NOTE — Telephone Encounter (Signed)
Patient called to report he is very "stressed out" and anxious and can't sleep at night. Requests MD call in something for him and wishes to use the "[facility] instead of [facility]". Called social worker, Johnnye Lana and requested she contact patient to talk about stress management.

## 2011-11-13 ENCOUNTER — Telehealth: Payer: Self-pay | Admitting: *Deleted

## 2011-11-13 NOTE — Telephone Encounter (Signed)
Patient called reporting he is stressed with anxiety and can't sleep.  Called Social Worker who received referral today and will try to call patient but it will be more prudent to talk with him in person.  Informed of appts on Monday, 11-17-11.   Per Johann Capers patient has been approved for [employer] with a $400.00 total at this time.   Called patient back (call was lost) and informed him of referral and approval of the fund.  This nurse informed him we have most patient's try benadryl OTC.  Instructed him to start with one pill but not to take more than two nightly.  Says he is snapping at people and is tired because he can't rest.  Suggested he try meditation, walking or running if able, what ever hobby or skill he enjoys to do this to get away from people to find some peace ... Even if going in a closet to get away from his stress is all he can do to try this.  Informed him he can get this OTC med using the fund per the financial counselor.

## 2011-11-14 ENCOUNTER — Other Ambulatory Visit: Payer: Self-pay | Admitting: *Deleted

## 2011-11-14 ENCOUNTER — Encounter: Payer: Self-pay | Admitting: *Deleted

## 2011-11-14 ENCOUNTER — Telehealth (HOSPITAL_COMMUNITY): Payer: Self-pay | Admitting: Oncology

## 2011-11-14 MED ORDER — ZOLPIDEM TARTRATE 5 MG PO TABS: 5.0000 mg | ORAL_TABLET | Freq: Every evening | ORAL | Status: DC | PRN

## 2011-11-14 NOTE — Progress Notes (Signed)
Clinical Social Worker attempted to contact pt to follow up regarding increased stress and anxiety.  CSW attempted to call pt multiple times, but was unable to reach pt.  CSW will continue attempting to contact pt.    Johnnye Lana, MSW, [place] Worker [facility] [phone]

## 2011-11-14 NOTE — Progress Notes (Signed)
Encounter addended by: Deirdre Evener, RN on: 11/14/2011  7:54 PM<BR>     Documentation filed: Visit Diagnoses

## 2011-11-14 NOTE — Telephone Encounter (Signed)
Dr. Benay Spice reviewed earlier note. OK to try Ambien 54m QHS PRN. Rx sent to pharmacy. Left message on voicemail for pt to call office.

## 2011-11-16 ENCOUNTER — Other Ambulatory Visit: Payer: Self-pay | Admitting: Oncology

## 2011-11-17 ENCOUNTER — Other Ambulatory Visit: Payer: Self-pay | Admitting: Oncology

## 2011-11-17 ENCOUNTER — Encounter: Payer: Self-pay | Admitting: *Deleted

## 2011-11-17 ENCOUNTER — Ambulatory Visit: Payer: Self-pay

## 2011-11-17 ENCOUNTER — Ambulatory Visit (HOSPITAL_COMMUNITY)
Admission: RE | Admit: 2011-11-17 | Discharge: 2011-11-17 | Disposition: A | Discharge status: Home / Self Care | Payer: Self-pay | Source: Ambulatory Visit | Attending: Oncology | Admitting: Oncology

## 2011-11-17 ENCOUNTER — Ambulatory Visit: Payer: Self-pay | Admitting: Nutrition

## 2011-11-17 ENCOUNTER — Ambulatory Visit (HOSPITAL_BASED_OUTPATIENT_CLINIC_OR_DEPARTMENT_OTHER): Payer: Self-pay

## 2011-11-17 VITALS — BP 139/86 | HR 74 | Temp 98.8°F

## 2011-11-17 DIAGNOSIS — C210 Malignant neoplasm of anus, unspecified: Secondary | ICD-10-CM

## 2011-11-17 DIAGNOSIS — Z5111 Encounter for antineoplastic chemotherapy: Secondary | ICD-10-CM

## 2011-11-17 MED ORDER — LIDOCAINE HCL 1 % IJ SOLN
INTRAMUSCULAR | Status: AC
  Filled 2011-11-17: qty 40

## 2011-11-17 MED ORDER — MITOMYCIN CHEMO IV INJECTION 20 MG
10.2500 mg/m2 | Freq: Once | INTRAVENOUS | Status: AC
  Administered 2011-11-17: 20 mg via INTRAVENOUS
  Filled 2011-11-17: qty 40

## 2011-11-17 MED ORDER — HEPARIN SOD (PORK) LOCK FLUSH 100 UNIT/ML IV SOLN
250.0000 [IU] | Freq: Once | INTRAVENOUS | Status: AC
  Administered 2011-11-17: 250 [IU] via INTRAVENOUS

## 2011-11-17 MED ORDER — SODIUM CHLORIDE 0.9 % IV SOLN
Freq: Once | INTRAVENOUS | Status: AC
  Administered 2011-11-17: 14:00:00 via INTRAVENOUS

## 2011-11-17 MED ORDER — HEPARIN SOD (PORK) LOCK FLUSH 100 UNIT/ML IV SOLN
250.0000 [IU] | Freq: Once | INTRAVENOUS | Status: DC | PRN
  Filled 2011-11-17: qty 5

## 2011-11-17 MED ORDER — SODIUM CHLORIDE 0.9 % IJ SOLN
10.0000 mL | INTRAMUSCULAR | Status: DC | PRN
  Filled 2011-11-17: qty 10

## 2011-11-17 MED ORDER — ONDANSETRON 8 MG/50ML IVPB (CHCC)
8.0000 mg | Freq: Once | INTRAVENOUS | Status: AC
  Administered 2011-11-17: 8 mg via INTRAVENOUS

## 2011-11-17 MED ORDER — DEXAMETHASONE SODIUM PHOSPHATE 10 MG/ML IJ SOLN
10.0000 mg | Freq: Once | INTRAMUSCULAR | Status: AC
  Administered 2011-11-17: 10 mg via INTRAVENOUS

## 2011-11-17 MED ORDER — SODIUM CHLORIDE 0.9 % IV SOLN
1000.0000 mg/m2/d | INTRAVENOUS | Status: DC
  Administered 2011-11-17: 7800 mg via INTRAVENOUS
  Filled 2011-11-17: qty 156

## 2011-11-17 NOTE — Procedures (Signed)
US/fluoroscopic guided right SL PICC placed. Length 45 cm. Tip SVC/RA junction. No immediate complications.

## 2011-11-17 NOTE — Progress Notes (Signed)
Clinical Social Work met with pt in the infusion room to follow up regarding increased stress and anxiety.  Pt stated on top of his diagnosis and treatment he had other family issues that were causing increased stress.  Pt reported having trouble sleeping and becoming easily agitated.  Pt stated he had been prescribed mediation to help him sleep; which he feels will help.  CSW discussed other support options; including mindfulness, deep breathing, and other stress reducing activities.  Pt stated he was open to other support options, but would like to try the medication 1st.  CSW provided the pt with a patient an family support calendar and [facility] support information, and encouraged him to call with any needs or concerns.  Pt was appreciative of contact and stated he would call with any needs.  Martin [name], MSW, [place] Worker [facility] [phone]

## 2011-11-17 NOTE — Progress Notes (Signed)
I spoke to Martin [name] in the chemotherapy area.  He denies any nutritional issues at this time.  His weight is stable at approximately 168 pounds.  He denies any issues with eating at this time. Constipation tends to be an ongoing issue with the patient, but mostly because he forgets to take his stool softener.  NUTRITION DIAGNOSIS:  Food and nutrition-related knowledge deficit has resolved.  INTERVENTION:  I have provided reinforcement and support for Martin [name] to continue small amounts of food more often.  I have encouraged him to continue to promote weight maintenance.  I have encouraged him call me with questions or concerns.  NEXT VISIT:  There is no followup scheduled with Martin [name] at this time.  However, he agrees to call me if he has any nutritional needs.    ______________________________ Ernestene Kiel, RD, LDN Clinical Nutrition Specialist BN/MEDQ  D:  11/17/2011  T:  11/17/2011  Job:  [id number]

## 2011-11-18 ENCOUNTER — Telehealth: Payer: Self-pay | Admitting: *Deleted

## 2011-11-18 ENCOUNTER — Ambulatory Visit: Payer: Self-pay

## 2011-11-18 ENCOUNTER — Encounter: Payer: Self-pay | Admitting: Oncology

## 2011-11-18 NOTE — Telephone Encounter (Signed)
Message copied by Cherylynn Ridges on Tue Nov 18, 2011  1:23 PM ------      Message from: Sharlynn Oliphant A      Created: Mon Nov 17, 2011  3:17 PM      Regarding: Chemo follow up call      Contact: [phone]       1st time mitomycin, 96 hour 5FU pump connected, had PICC placed prior to chemo on Monday.

## 2011-11-18 NOTE — Progress Notes (Signed)
Patient received one prescription from [place] on 11/17/11 $3.00,his remaning balance [facility] $397.00.

## 2011-11-18 NOTE — Telephone Encounter (Signed)
Patient is doing fairly well. Good appetite and using Boost supplements.  Denies n/v at this time.  Drinking fluids well.  Reports he has a burning sensation from his rectum already.  Reportedly surgeon recommended he use 2% Hydrocortisone cream but this burns.  This nurse instructed to try petroleum jelly, A& D ointment or Tucks and to ask Radiation dept. what they recommend.  No further questions.

## 2011-11-19 ENCOUNTER — Ambulatory Visit
Admission: RE | Admit: 2011-11-19 | Discharge: 2011-11-19 | Disposition: A | Discharge status: Home / Self Care | Payer: Self-pay | Source: Ambulatory Visit | Attending: Radiation Oncology | Admitting: Radiation Oncology

## 2011-11-19 ENCOUNTER — Ambulatory Visit: Payer: Self-pay

## 2011-11-19 ENCOUNTER — Other Ambulatory Visit: Payer: Self-pay | Admitting: *Deleted

## 2011-11-19 ENCOUNTER — Telehealth: Payer: Self-pay | Admitting: Oncology

## 2011-11-19 VITALS — Wt 167.1 lb

## 2011-11-19 DIAGNOSIS — C210 Malignant neoplasm of anus, unspecified: Secondary | ICD-10-CM

## 2011-11-19 MED ORDER — OXYCODONE-ACETAMINOPHEN 5-325 MG PO TABS: 1.0000 | ORAL_TABLET | ORAL | Status: DC | PRN

## 2011-11-19 NOTE — Progress Notes (Signed)
Received patient in the clinic today following treatment for post sim education with Sam, RN. Patient is alert and oriented to person, place, and time. No distress noted. Steady gait noted. Pleasant affect noted. Patient denies pain at this time. Oriented patient to staff and routine of the clinic. Provided patient with RADIATION THERAPY AND YOU handbook then, reviewed pertinent information. Reviewed potential side effects and management. Provided patient with this writers business card and encouraged to call with any needs. Provided patient with a sitz bath and instructed upon use. Patient verbalized understanding of all things reviewed.

## 2011-11-19 NOTE — Telephone Encounter (Signed)
Call from pt requesting refill on Oxycodone. He states he takes 6-8 tabs daily for pain. Reports adequate pain control with this regimen. Informed him he can pick up rx on 11/20/11.

## 2011-11-19 NOTE — Telephone Encounter (Signed)
Patient call this morning asking about the status of his medicaid,i told him is going to take up to 54month,because tara just mail the application two weeks ago,and also he needed the number to the billing dept.

## 2011-11-20 ENCOUNTER — Telehealth: Payer: Self-pay | Admitting: *Deleted

## 2011-11-20 ENCOUNTER — Ambulatory Visit
Admission: RE | Admit: 2011-11-20 | Discharge: 2011-11-20 | Disposition: A | Discharge status: Home / Self Care | Payer: Self-pay | Source: Ambulatory Visit | Attending: Radiation Oncology | Admitting: Radiation Oncology

## 2011-11-20 ENCOUNTER — Ambulatory Visit: Payer: Self-pay

## 2011-11-20 NOTE — Telephone Encounter (Signed)
Call from pt requesting refill on pain medications. Asking if chemotherapy will make teeth sensitive. Reinforced teaching re: oral care while on chemo. Stressed to pt that he should see a Pharmacist, community, he reports he is waiting for Medicaid to be approved as he has no insurance.

## 2011-11-21 ENCOUNTER — Ambulatory Visit: Payer: Self-pay

## 2011-11-21 ENCOUNTER — Ambulatory Visit
Admission: RE | Admit: 2011-11-21 | Discharge: 2011-11-21 | Disposition: A | Discharge status: Home / Self Care | Payer: Self-pay | Source: Ambulatory Visit | Attending: Radiation Oncology | Admitting: Radiation Oncology

## 2011-11-21 ENCOUNTER — Encounter: Payer: Self-pay | Admitting: Oncology

## 2011-11-21 ENCOUNTER — Ambulatory Visit (HOSPITAL_BASED_OUTPATIENT_CLINIC_OR_DEPARTMENT_OTHER): Payer: Self-pay

## 2011-11-21 VITALS — BP 128/79 | HR 100 | Temp 99.0°F

## 2011-11-21 DIAGNOSIS — Z452 Encounter for adjustment and management of vascular access device: Secondary | ICD-10-CM

## 2011-11-21 DIAGNOSIS — C210 Malignant neoplasm of anus, unspecified: Secondary | ICD-10-CM

## 2011-11-21 MED ORDER — SODIUM CHLORIDE 0.9 % IJ SOLN
10.0000 mL | INTRAMUSCULAR | Status: DC | PRN
  Administered 2011-11-21: 10 mL
  Filled 2011-11-21: qty 10

## 2011-11-21 MED ORDER — HEPARIN SOD (PORK) LOCK FLUSH 100 UNIT/ML IV SOLN
250.0000 [IU] | Freq: Once | INTRAVENOUS | Status: AC | PRN
  Administered 2011-11-21: 250 [IU]
  Filled 2011-11-21: qty 5

## 2011-11-21 NOTE — Progress Notes (Signed)
Patient received one prescription from [place] on 11/20/11 $5.52,his remaninig balance [facility] $391.48.

## 2011-11-23 ENCOUNTER — Encounter (HOSPITAL_COMMUNITY): Payer: Self-pay

## 2011-11-23 ENCOUNTER — Emergency Department (HOSPITAL_COMMUNITY)
Admission: EM | Admit: 2011-11-23 | Discharge: 2011-11-23 | Discharge status: Against Medical Advice | Payer: Self-pay | Attending: Emergency Medicine | Admitting: Emergency Medicine

## 2011-11-23 DIAGNOSIS — J3489 Other specified disorders of nose and nasal sinuses: Secondary | ICD-10-CM | POA: Insufficient documentation

## 2011-11-23 DIAGNOSIS — J029 Acute pharyngitis, unspecified: Secondary | ICD-10-CM | POA: Insufficient documentation

## 2011-11-23 DIAGNOSIS — C210 Malignant neoplasm of anus, unspecified: Secondary | ICD-10-CM | POA: Insufficient documentation

## 2011-11-23 NOTE — ED Notes (Signed)
Patient reports that he is currently receiving chemo and radiation for anal cancer. Finished week long infusion this past Friday. Has become increasingly congested and bloody nasal drainage with sorethroat

## 2011-11-24 ENCOUNTER — Ambulatory Visit
Admission: RE | Admit: 2011-11-24 | Discharge: 2011-11-24 | Disposition: A | Discharge status: Home / Self Care | Payer: Self-pay | Source: Ambulatory Visit | Attending: Radiation Oncology | Admitting: Radiation Oncology

## 2011-11-24 ENCOUNTER — Ambulatory Visit: Payer: Self-pay

## 2011-11-24 ENCOUNTER — Ambulatory Visit (INDEPENDENT_AMBULATORY_CARE_PROVIDER_SITE_OTHER): Payer: Self-pay | Admitting: Surgery

## 2011-11-24 ENCOUNTER — Telehealth: Payer: Self-pay | Admitting: *Deleted

## 2011-11-24 ENCOUNTER — Encounter (INDEPENDENT_AMBULATORY_CARE_PROVIDER_SITE_OTHER): Payer: Self-pay | Admitting: Surgery

## 2011-11-24 VITALS — BP 158/100 | HR 98 | Temp 98.5°F | Resp 18 | Ht 71.0 in | Wt 161.4 lb

## 2011-11-24 DIAGNOSIS — K4090 Unilateral inguinal hernia, without obstruction or gangrene, not specified as recurrent: Secondary | ICD-10-CM | POA: Insufficient documentation

## 2011-11-24 DIAGNOSIS — C210 Malignant neoplasm of anus, unspecified: Secondary | ICD-10-CM

## 2011-11-24 DIAGNOSIS — C801 Malignant (primary) neoplasm, unspecified: Secondary | ICD-10-CM

## 2011-11-24 MED ORDER — LOPERAMIDE HCL 2 MG PO CAPS: 2.0000 mg | ORAL_CAPSULE | Freq: Four times a day (QID) | ORAL | Status: DC | PRN

## 2011-11-24 NOTE — Telephone Encounter (Signed)
Patient presents to clinic reporting his throat is sore/raw and has had some bleeding from nose when he blows. Occasional chills with no fever. No energy and some stomach cramps as if diarrhea may start. Noted that he saw surgeon today and was afebrile. His girlfriend has had recent viral illness. Checked his oral mucosa and noted no redness or sores in mouth or back of throat (as was noted per surgeon office visit). Suggested he rinse mouth with salt and warm water rinses and take Tylenol prn. Will call in Imodium to [place] for him to take for diarrhea. Told him that some abdominal cramping and fatigue is expected with his treatment. Follow up with radiation oncologist this week as scheduled.

## 2011-11-24 NOTE — Progress Notes (Signed)
Visit Diagnoses: 1. Anal cancer   2. Cancer   3. Inguinal hernia unilateral, non-recurrent, left     HISTORY: The patient is a 51 year old white male. 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 He is now receiving adjuvant chemotherapy and radiation therapy at the [facility]. Patient continues to note some minor symptoms from his left inguinal hernia. He wishes to have it evaluated today.  PERTINENT REVIEW OF SYSTEMS: Irregular bowel movements, on laxatives; occasional pain left groin; hernia is always reducible  EXAM: HEENT: normocephalic; pupils equal and reactive; sclerae clear; dentition good; mucous membranes moist NECK:  symmetric on extension; no palpable anterior or posterior cervical lymphadenopathy; no supraclavicular masses; no tenderness CHEST: clear to auscultation bilaterally without rales, rhonchi, or wheezes CARDIAC: regular rate and rhythm without significant murmur; peripheral pulses are full GU:  Examination of the perineum shows normal anoderm. Digital rectal exam shows scarring at the site of tumor excision but no sign of recurrence. There is moderate discomfort. Penis and testicles are normal without mass or lesion. Palpation in the right inguinal canal with cough and Valsalva showed no sign of hernia. In the left groin there is an obvious branch. On palpation there is clearly an inguinal hernia. This is reducible. It augments with cough and Valsalva. 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 He will return to see me in 8 weeks. At that point we  will consider scheduling an elective repair of his left inguinal hernia.  Earnstine Regal, MD, [facility]

## 2011-11-24 NOTE — Patient Instructions (Signed)

## 2011-11-25 ENCOUNTER — Ambulatory Visit: Payer: Self-pay

## 2011-11-25 ENCOUNTER — Encounter: Payer: Self-pay | Admitting: Oncology

## 2011-11-25 NOTE — Progress Notes (Signed)
Patient received one prescription from [place] on 11/24/11 $3.44,his remaninig balance [facility] $388.04.

## 2011-11-26 ENCOUNTER — Ambulatory Visit
Admission: RE | Admit: 2011-11-26 | Discharge: 2011-11-26 | Disposition: A | Discharge status: Home / Self Care | Payer: Self-pay | Source: Ambulatory Visit | Attending: Radiation Oncology | Admitting: Radiation Oncology

## 2011-11-26 ENCOUNTER — Encounter: Payer: Self-pay | Admitting: Radiation Oncology

## 2011-11-26 ENCOUNTER — Ambulatory Visit: Payer: Self-pay

## 2011-11-26 VITALS — BP 149/81 | HR 86 | Resp 18 | Wt 157.9 lb

## 2011-11-26 DIAGNOSIS — C210 Malignant neoplasm of anus, unspecified: Secondary | ICD-10-CM

## 2011-11-26 NOTE — Progress Notes (Signed)
Patient presents to the clinic today for under treat visit with Dr. Sondra Come. Patient is alert and oriented to person, place, and time. No distress noted. Steady gait noted. Pleasant affect noted. Patient reports that he missed treatment yesterday because his truck broke down. Patient reports that his truck is fixed. Patient reports a sore throat and congestion in his nose for the last several days. Steady decline of weight noted. Patient reports a decreased appetite and deminished taste. Patient reports he is taking in two carnation instant breakfast shakes daily in addition to meals. Patient reports that he will increase his intake and try to put on more weight. Patient reports that his girlfriend has just returned to their home after back surgery and rehab. Patient is trying to care for himself and his girlfriend. Patient reports diarrhea has resolved. Patient denies nausea, vomiting, or dizziness. Patient reports a headache related to "bad tooth." Patient reports that he was going to get all his teeth pulled but 'ran out of funds." Reported all findings to Dr. Sondra Come.

## 2011-11-26 NOTE — Progress Notes (Signed)
CC:   Martin Regal, MD Ladell Pier, M.D.  DIAGNOSIS:  Squamous cell carcinoma of the anus.  NARRATIVE:  Martin [name] is seen today for weekly assessment.  He has completed 5/28 planned treatments directed at the pelvis and inguinal area (900 cGy of a planned 5040 cGy).  The patient is tolerating his treatments well at this time.  He denies any significant problems with nausea or diarrhea.  The patient does have Imodium and Compazine for these issues.  The patient denies any discomfort in the scrotum, penis area, or difficulty with urination.  The patient denies any rectal bleeding.  Martin [name] did see Dr. Harlow Asa, and the patient does have an inguinal hernia which will likely require surgery after the patient completes his radiation treatment.  EXAMINATION:  Vital signs: The patient's weight is 157.9 pounds.  The patient is afebrile with temperature 98.2, pulse is 86, and blood pressure is 149/81.  Examination of the lungs reveals them to be clear. The heart has a regular rhythm and rate.  The abdomen is soft and nontender with normal bowel sounds.  IMPRESSION AND PLAN:  The patient is tolerating his radiation and radiosensitizing chemotherapy reasonably well at this time.  The patient's radiation fields are setting up accurately. The patient's radiation chart was checked today.  Plan is to continue with radiation to a cumulative dose of 5040 cGy.    ______________________________ Blair Promise, Ph.D., M.D. JDK/MEDQ  D:  11/26/2011  T:  11/26/2011  Job:  [id number]

## 2011-11-27 ENCOUNTER — Ambulatory Visit: Payer: Self-pay

## 2011-11-27 ENCOUNTER — Ambulatory Visit
Admission: RE | Admit: 2011-11-27 | Discharge: 2011-11-27 | Disposition: A | Discharge status: Home / Self Care | Payer: Self-pay | Source: Ambulatory Visit | Attending: Radiation Oncology | Admitting: Radiation Oncology

## 2011-11-28 ENCOUNTER — Ambulatory Visit (HOSPITAL_BASED_OUTPATIENT_CLINIC_OR_DEPARTMENT_OTHER): Payer: Self-pay | Admitting: Lab

## 2011-11-28 ENCOUNTER — Telehealth: Payer: Self-pay | Admitting: Oncology

## 2011-11-28 ENCOUNTER — Ambulatory Visit: Payer: Self-pay

## 2011-11-28 ENCOUNTER — Ambulatory Visit (HOSPITAL_BASED_OUTPATIENT_CLINIC_OR_DEPARTMENT_OTHER): Payer: Self-pay | Admitting: Nurse Practitioner

## 2011-11-28 ENCOUNTER — Other Ambulatory Visit: Payer: Self-pay | Admitting: *Deleted

## 2011-11-28 ENCOUNTER — Ambulatory Visit
Admission: RE | Admit: 2011-11-28 | Discharge: 2011-11-28 | Disposition: A | Discharge status: Home / Self Care | Payer: Self-pay | Source: Ambulatory Visit | Attending: Radiation Oncology | Admitting: Radiation Oncology

## 2011-11-28 VITALS — BP 143/92 | HR 73 | Temp 97.0°F | Ht 71.0 in | Wt 164.0 lb

## 2011-11-28 DIAGNOSIS — C210 Malignant neoplasm of anus, unspecified: Secondary | ICD-10-CM

## 2011-11-28 DIAGNOSIS — G4700 Insomnia, unspecified: Secondary | ICD-10-CM

## 2011-11-28 DIAGNOSIS — C211 Malignant neoplasm of anal canal: Secondary | ICD-10-CM

## 2011-11-28 DIAGNOSIS — Z09 Encounter for follow-up examination after completed treatment for conditions other than malignant neoplasm: Secondary | ICD-10-CM

## 2011-11-28 LAB — CBC WITH DIFFERENTIAL/PLATELET
Basophils Absolute: 0 10*3/uL (ref 0.0–0.1)
Eosinophils Absolute: 0.1 10*3/uL (ref 0.0–0.5)
HCT: 39.6 % (ref 38.4–49.9)
HGB: 13.6 g/dL (ref 13.0–17.1)
MCV: 94.3 fL (ref 79.3–98.0)
NEUT#: 3.6 10*3/uL (ref 1.5–6.5)
RDW: 13.2 % (ref 11.0–14.6)
lymph#: 1.3 10*3/uL (ref 0.9–3.3)

## 2011-11-28 MED ORDER — DIAZEPAM 5 MG PO TABS: 5.0000 mg | ORAL_TABLET | Freq: Every evening | ORAL | Status: AC | PRN

## 2011-11-28 NOTE — Progress Notes (Signed)
OFFICE PROGRESS NOTE  Interval history:  Martin [name] returns as scheduled. He continues daily radiation. He completed cycle 1 5-fluorouracil/mitomycin beginning 11/17/2011. He had one episode of nausea and vomiting and one episode of diarrhea. He denies mouth sores. No fevers or sweats. He intermittently notes mild burning with urination. Rectal bleeding has resolved. His main complaint is insomnia. Ambien has not been effective. He would like to try Valium.   Objective: Blood pressure 143/92, pulse 73, temperature 97 F (36.1 C), temperature source Oral, height 5' 11"  (1.803 m), weight 164 lb (74.39 kg).  Oropharynx is without thrush or ulceration. Lungs are clear. Regular cardiac rhythm. Abdomen is soft and nontender. No organomegaly. Extremities are without edema. Perianal region/perineum hyperpigmented. No skin breakdown.  Lab Results: Lab Results  Component Value Date   WBC 9.5 11/03/2011   HGB 15.3 11/03/2011   HCT 44.5 11/03/2011   MCV 93.5 11/03/2011   PLT 159 11/03/2011    Chemistry:    Chemistry      Component Value Date/Time   NA 135 11/03/2011 0924   K 4.2 11/03/2011 0924   CL 102 11/03/2011 0924   CO2 26 11/03/2011 0924   BUN 11 11/03/2011 0924   CREATININE 0.99 11/03/2011 0924      Component Value Date/Time   CALCIUM 9.1 11/03/2011 0924   ALKPHOS 75 11/03/2011 0924   AST 19 11/03/2011 0924   ALT 16 11/03/2011 0924   BILITOT 0.3 11/03/2011 0924       Studies/Results: Nm Pet Image Initial (pi) Skull Base To Thigh  11/06/2011  *RADIOLOGY REPORT*  Clinical Data: Initial treatment strategy for new diagnosis of anal cancer.  Staging. Status post transanal excision on 10/24/2011.  NUCLEAR MEDICINE PET CT SKULL BASE TO THIGH  Technique:  18.8 mCi F-18 FDG was injected intravenously via the right AC.  Full-ring PET imaging was performed from the skull base through the mid-thighs 75  minutes after injection.  CT data was obtained and used for attenuation correction and anatomic  localization only.  (This was not acquired as a diagnostic CT examination.)  Fasting Blood Glucose:  85  Patient Weight:  166 pounds.  Comparison: Plain film chest of 10/13/2011.  Abdominal pelvic CTs of 08/28/2011.  Findings: PET images demonstrate no abnormal activity within the neck, chest, or abdomen.  2 foci of right pelvic hypermetabolism are due to ureteric excretion and are without CT correlate.  Within the 7 o'clock position of the rectum, an area of mild hypermetabolism corresponds to  probable retained stool.  This measures a S.U.V. max of 2.5, including on image 242.  Low anal hypermetabolism is symmetric and favored to be physiologic.  This measures a S.U.V. max of 3.5, including on image 268. No CT correlate.  CT images performed for attenuation correction demonstrate no significant findings within the neck.  LAD coronary artery atherosclerosis, including on images 101 - 102. Biapical pleural thickening.  A 3 mm posterior lingular nodule on image 101.  A posterior gastric diverticulum.  No perirectal or perianal adenopathy.  The ischio-rectal and ischio-anal fossae are within normal limits.  IMPRESSION:  1.  No hypermetabolic primary or metastatic disease identified. Likely physiologic anal and posterior rectal hypermetabolism, as detailed above. 2.  A tiny lingular nodule is nonspecific and warrants followup attention. 3. Age advanced coronary artery atherosclerosis.  Recommend assessment of coronary risk factors and consideration of medical therapy.  Original Report Authenticated By: Areta Haber, M.D.   Ir Fluoro Guide Cv Line Right  11/17/2011  *RADIOLOGY REPORT*  Clinical Data: Anal carcinoma; central venous access is requested for chemotherapy.  PICC LINE PLACEMENT WITH ULTRASOUND AND FLUOROSCOPIC  GUIDANCE  Fluoroscopy Time: 0.7 minutes.  The right arm was prepped with chlorhexidine, draped in the usual sterile fashion using maximum barrier technique (cap and mask, sterile gown, sterile  gloves, large sterile sheet, hand hygiene and cutaneous antisepsis) and infiltrated locally with 1% Lidocaine.  Ultrasound demonstrated patency of the right basilic vein, and this was documented with an image.  Under real-time ultrasound guidance, this vein was accessed with a 21 gauge micropuncture needle and image documentation was performed.  The needle was exchanged over a guidewire for a peel-away sheath through which a five [place] single lumen PICC trimmed to 45 cm was advanced, positioned with its tip at the lower SVC/right atrial junction.  Fluoroscopy during the procedure and fluoro spot radiograph confirms appropriate catheter position.  The catheter was flushed, secured to the skin with Prolene sutures, and covered with a sterile dressing.  Complications:  None  IMPRESSION: Successful right arm PICC line placement with ultrasound and fluoroscopic guidance.  The catheter is ready for use.  Read by: Rowe Robert, P.A.-C  Original Report Authenticated By: Rachel Moulds, M.D.   Ir US Guide Vasc Access Right  11/19/2011  *RADIOLOGY REPORT*  Clinical Data: Anal carcinoma; central venous access is requested for chemotherapy.  PICC LINE PLACEMENT WITH ULTRASOUND AND FLUOROSCOPIC  GUIDANCE  Fluoroscopy Time: 0.7 minutes.  The right arm was prepped with chlorhexidine, draped in the usual sterile fashion using maximum barrier technique (cap and mask, sterile gown, sterile gloves, large sterile sheet, hand hygiene and cutaneous antisepsis) and infiltrated locally with 1% Lidocaine.  Ultrasound demonstrated patency of the right basilic vein, and this was documented with an image.  Under real-time ultrasound guidance, this vein was accessed with a 21 gauge micropuncture needle and image documentation was performed.  The needle was exchanged over a guidewire for a peel-away sheath through which a five [place] single lumen PICC trimmed to 45 cm was advanced, positioned with its tip at the lower SVC/right atrial  junction.  Fluoroscopy during the procedure and fluoro spot radiograph confirms appropriate catheter position.  The catheter was flushed, secured to the skin with Prolene sutures, and covered with a sterile dressing.  Complications:  None  IMPRESSION: Successful right arm PICC line placement with ultrasound and fluoroscopic guidance.  The catheter is ready for use.  Read by: Rowe Robert, P.A.-C  Original Report Authenticated By: Rachel Moulds, M.D.    Medications: I have reviewed the patient's current medications.  Assessment/Plan:  1. 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 He began radiation 11/19/2011. He completed cycle #1 5-fluorouracil/mitomycin C beginning 11/17/2011. 2. Insomnia. Ambien has not been effective. He will try Valium 5 mg at bedtime as needed. 3. Tobacco use. 4. Degenerative disc disease.  Disposition-Mr. Peden appears stable. He continues daily radiation. We will obtain a nadir CBC today. We are scheduling him for PICC line placement 12/12/2011 with plans to proceed with cycle 2 5-FU/mitomycin beginning 12/15/2011. We will see him in followup prior to treatment on 12/15/2011. He will contact the office in the interim with any problems.  Plan reviewed with Dr. Benay Spice.  Ned Card ANP/GNP-BC

## 2011-11-28 NOTE — Telephone Encounter (Signed)
Appts made and printed for pt and pt to have picc line 4/5 and 7:30

## 2011-12-01 ENCOUNTER — Encounter: Payer: Self-pay | Admitting: Oncology

## 2011-12-01 ENCOUNTER — Ambulatory Visit: Payer: Self-pay

## 2011-12-01 ENCOUNTER — Ambulatory Visit
Admission: RE | Admit: 2011-12-01 | Discharge: 2011-12-01 | Disposition: A | Discharge status: Home / Self Care | Payer: Self-pay | Source: Ambulatory Visit | Attending: Radiation Oncology | Admitting: Radiation Oncology

## 2011-12-01 NOTE — Progress Notes (Signed)
Patient received one prescription from [place] on 12/01/11 $3.00,his remaninig balance [facility] $385.04.

## 2011-12-02 ENCOUNTER — Encounter: Payer: Self-pay | Admitting: Radiation Oncology

## 2011-12-02 ENCOUNTER — Ambulatory Visit: Payer: Self-pay

## 2011-12-02 ENCOUNTER — Ambulatory Visit
Admission: RE | Admit: 2011-12-02 | Discharge: 2011-12-02 | Disposition: A | Discharge status: Home / Self Care | Payer: Self-pay | Source: Ambulatory Visit | Attending: Radiation Oncology | Admitting: Radiation Oncology

## 2011-12-02 VITALS — BP 125/76 | HR 82 | Temp 97.4°F | Wt 161.4 lb

## 2011-12-02 DIAGNOSIS — C210 Malignant neoplasm of anus, unspecified: Secondary | ICD-10-CM

## 2011-12-02 NOTE — Progress Notes (Signed)
[place]     Rexene Edison, M.D. [place], [place] [address]               Blair Promise, M.D., Ph.D. Phone: [phone]      [place] A. Tammi Klippel, M.D. Fax: [phone]      Jodelle Gross, M.D., Ph.D.         Thea Silversmith, M.D.         Wyvonnia Lora, M.D Weekly Treatment Management Note  Name: Martin [name]     MRN: [id number]        CSN: [id number] Date: 12/02/2011      DOB: [birth year]  CC: Provider Not In System         [place]    Status: Outpatient  Diagnosis: The encounter diagnosis was Anal cancer.  Current Dose: 1620  Current Fraction: 9  Planned Dose: 5040  Narrative: Martin [name] was seen today for weekly treatment management. The chart was checked and MVCT  were reviewed. Martin [name] is having some fatigue at this time. He attributes some of this to poor sleeping at night. Patient is also noticed some itching in the scrotal region. This does have some mild nausea. The patient is having no more problems with diarrhea since he started Imodium. Patient denies any rectal bleeding  Aspirin; Codeine; and Morphine and related  Allergies   Current Outpatient Prescriptions  Medication Sig Dispense Refill  . diazepam (VALIUM) 5 MG tablet Take 1 tablet (5 mg total) by mouth at bedtime as needed for sleep.  30 tablet  0  . docusate sodium (COLACE) 100 MG capsule Take 100 mg by mouth 2 (two) times daily as needed. For constipation      . loperamide (IMODIUM) 2 MG capsule Take 1 capsule (2 mg total) by mouth 4 (four) times daily as needed for diarrhea or loose stools.  30 capsule  1  . oxyCODONE-acetaminophen (PERCOCET) 5-325 MG per tablet Take 1-2 tablets by mouth every 4 (four) hours as needed for pain.  90 tablet  0  . prochlorperazine (COMPAZINE) 10 MG tablet Take 1 tablet (10 mg total) by mouth every 6 (six) hours as needed.  60 tablet  1  . psyllium (METAMUCIL) 0.52 G capsule Take 0.52 g by mouth 2 (two) times  daily.      Marland Kitchen zolpidem (AMBIEN) 5 MG tablet Take 1 tablet (5 mg total) by mouth at bedtime as needed for sleep.  30 tablet  1   Labs:  Lab Results  Component Value Date   WBC 5.5 11/28/2011   HGB 13.6 11/28/2011   HCT 39.6 11/28/2011   MCV 94.3 11/28/2011   PLT 108* 11/28/2011   Lab Results  Component Value Date   CREATININE 0.99 11/03/2011   BUN 11 11/03/2011   NA 135 11/03/2011   K 4.2 11/03/2011   CL 102 11/03/2011   CO2 26 11/03/2011   Lab Results  Component Value Date   ALT 16 11/03/2011   AST 19 11/03/2011   BILITOT 0.3 11/03/2011    Physical Examination:  weight is 161 lb 6.4 oz (73.211 kg). His temperature is 97.4 F (36.3 C). His blood pressure is 125/76 and his pulse is 82.    Wt Readings from Last 3 Encounters:  12/02/11 161 lb 6.4 oz (73.211 kg)  11/28/11 164 lb (74.39 kg)  11/26/11 157 lb 14.4 oz (71.623 kg)     Lungs -  Normal respiratory effort, chest expands symmetrically. Lungs are clear to auscultation, no crackles or wheezes.  Heart has regular rhythm and rate  Abdomen is soft and non tender with normal bowel sounds Examination of the skin of the radiation portals reveals some mild erythema but no significant reaction.   Assessment:  Patient tolerating treatments well except for issues as above.  Plan: Continue treatment per original radiation prescription

## 2011-12-02 NOTE — Progress Notes (Signed)
Here for routine weekly assessment of radiation treatment to pelvis. Had 2 episodes of n/v in last 48 hours upon arising in am but was able to tolerate breakfast and keep down. Has compazine if needed. Feels very tired and weak. Vitals stable.

## 2011-12-03 ENCOUNTER — Ambulatory Visit: Payer: Self-pay

## 2011-12-04 ENCOUNTER — Encounter: Payer: Self-pay | Admitting: Radiation Oncology

## 2011-12-04 ENCOUNTER — Ambulatory Visit: Payer: Self-pay

## 2011-12-04 ENCOUNTER — Telehealth: Payer: Self-pay | Admitting: Radiation Oncology

## 2011-12-04 NOTE — Telephone Encounter (Signed)
Rad nurse (Val)  called and said that this patient has not shown up for the past two days for treatment and she thinks there may be transportation issues.  She tried calling him, but did not leave a message.  Levada Dy also called Abigail and let her know, (because she had notes in [facility] where she has talked with him before).  She said there are some other family issues going on as well.  I left vm with patient today to see if he needs addl asst or help with transportation.  He has been approved for EPP.

## 2011-12-04 NOTE — Progress Notes (Signed)
Val, RN, phoned stating patient has not shown for yesterday and today's radiation treatment and she is thinking there is a transportation issue.  Saw previous notes from [place], and phoned her to see if she could also follow up with patient.  Will get with FC, Baxter Flattery, to get her to also follow up with patient with a phone call and issuing a gas card to the patient, if necessary.

## 2011-12-05 ENCOUNTER — Ambulatory Visit
Admission: RE | Admit: 2011-12-05 | Discharge: 2011-12-05 | Disposition: A | Discharge status: Home / Self Care | Payer: Self-pay | Source: Ambulatory Visit | Attending: Radiation Oncology | Admitting: Radiation Oncology

## 2011-12-08 ENCOUNTER — Other Ambulatory Visit: Payer: Self-pay | Admitting: *Deleted

## 2011-12-08 ENCOUNTER — Ambulatory Visit
Admission: RE | Admit: 2011-12-08 | Discharge: 2011-12-08 | Disposition: A | Discharge status: Home / Self Care | Payer: Self-pay | Source: Ambulatory Visit | Attending: Radiation Oncology | Admitting: Radiation Oncology

## 2011-12-08 DIAGNOSIS — C210 Malignant neoplasm of anus, unspecified: Secondary | ICD-10-CM

## 2011-12-08 MED ORDER — OXYCODONE-ACETAMINOPHEN 5-325 MG PO TABS: 1.0000 | ORAL_TABLET | ORAL | Status: DC | PRN

## 2011-12-08 NOTE — Telephone Encounter (Signed)
Patient left VM requesting to pick up his pain med script at 1:30pm today.

## 2011-12-08 NOTE — Telephone Encounter (Signed)
Patient picked up his script for Percocet. Reminded him of [facility] policy of 24 hour notice for refill requests.

## 2011-12-09 ENCOUNTER — Ambulatory Visit
Admission: RE | Admit: 2011-12-09 | Discharge: 2011-12-09 | Disposition: A | Discharge status: Home / Self Care | Payer: Self-pay | Source: Ambulatory Visit | Attending: Radiation Oncology | Admitting: Radiation Oncology

## 2011-12-09 ENCOUNTER — Encounter: Payer: Self-pay | Admitting: Oncology

## 2011-12-09 ENCOUNTER — Encounter: Payer: Self-pay | Admitting: Radiation Oncology

## 2011-12-09 VITALS — BP 147/98 | HR 73 | Temp 98.3°F | Wt 158.2 lb

## 2011-12-09 DIAGNOSIS — C801 Malignant (primary) neoplasm, unspecified: Secondary | ICD-10-CM

## 2011-12-09 DIAGNOSIS — C210 Malignant neoplasm of anus, unspecified: Secondary | ICD-10-CM

## 2011-12-09 MED ORDER — BIAFINE EX EMUL
CUTANEOUS | Status: DC | PRN
  Administered 2011-12-09: 1 via TOPICAL

## 2011-12-09 NOTE — Progress Notes (Signed)
Encounter addended by: Arlyss Repress, RN on: 12/09/2011  2:45 PM<BR>     Documentation filed: Inpatient MAR

## 2011-12-09 NOTE — Progress Notes (Signed)
Here for routine weekly radiation treatment of pelvis for anal ca. Completed 12 of 28 treatments. Has some perineum discomfort.Has sitz bath to start when needed.Biafine  Given today to apply 2 to 3 times day prn.Buttock are hyperpigmented but no breaks in skin.

## 2011-12-09 NOTE — Progress Notes (Signed)
DIAGNOSIS:  Anal carcinoma.  NARRATIVE:  [name] is seen today for weekly assessment.  He has completed 2160 cGy of a planned 5040 cGy.  The patient is starting to notice some sensitivity in the perineum and buttocks region.  The patient was given Biafine for his skin today.  The patient denies any significant nausea at this time.  He denies any diarrhea since using Imodium.  The patient, however, has noticed some urinary urgency and frequency, particularly at night.  He has also noticed some mild dysuria.  I discussed with patient that we could consider medication for these issues if it becomes more of a problem.  The patient denies any hematuria or rectal bleeding.  PHYSICAL EXAMINATION:  The patient's weight is 158.2 pounds. Temperature 98.3, pulse 73, blood pressure is 147/98.  Examination of the lungs reveals them to be clear.  The heart has a regular rhythm and rate.  The abdomen is soft and nontender with normal bowel sounds.  IMPRESSION/PLAN:  The patient is tolerating his treatments reasonably well except for issues as above.  The patient's radiation fields are setting up accurately.  The patient's radiation chart was checked today. Plan is to continue to a cumulative dose of 5040 cGy along with radiosensitizing chemotherapy.    ______________________________ Blair Promise, Ph.D., M.D. JDK/MEDQ  D:  12/09/2011  T:  12/09/2011  Job:  [id number]

## 2011-12-09 NOTE — Progress Notes (Signed)
Patient received one prescription from [place] on 12/08/11 $5.52,his remaninig balance [facility] $394.48.

## 2011-12-10 ENCOUNTER — Ambulatory Visit
Admission: RE | Admit: 2011-12-10 | Discharge: 2011-12-10 | Disposition: A | Discharge status: Home / Self Care | Payer: Self-pay | Source: Ambulatory Visit | Attending: Radiation Oncology | Admitting: Radiation Oncology

## 2011-12-11 ENCOUNTER — Telehealth: Payer: Self-pay | Admitting: *Deleted

## 2011-12-11 ENCOUNTER — Ambulatory Visit
Admission: RE | Admit: 2011-12-11 | Discharge: 2011-12-11 | Disposition: A | Discharge status: Home / Self Care | Payer: Self-pay | Source: Ambulatory Visit | Attending: Radiation Oncology | Admitting: Radiation Oncology

## 2011-12-11 NOTE — Telephone Encounter (Signed)
Confirmed with Dr. Benay Spice that chemo is 1st week of radiation and then 28 days later. Wants him to get PICC on 4/5 and chemo on 4/8 as scheduled. Patient notified and he will report to radiology dept. At 0730 as scheduled.

## 2011-12-12 ENCOUNTER — Ambulatory Visit (HOSPITAL_COMMUNITY)
Admission: RE | Admit: 2011-12-12 | Discharge: 2011-12-12 | Disposition: A | Discharge status: Home / Self Care | Payer: Self-pay | Source: Ambulatory Visit | Attending: Nurse Practitioner | Admitting: Nurse Practitioner

## 2011-12-12 ENCOUNTER — Ambulatory Visit
Admission: RE | Admit: 2011-12-12 | Discharge: 2011-12-12 | Disposition: A | Discharge status: Home / Self Care | Payer: Self-pay | Source: Ambulatory Visit | Attending: Radiation Oncology | Admitting: Radiation Oncology

## 2011-12-12 ENCOUNTER — Other Ambulatory Visit: Payer: Self-pay | Admitting: Nurse Practitioner

## 2011-12-12 DIAGNOSIS — C210 Malignant neoplasm of anus, unspecified: Secondary | ICD-10-CM

## 2011-12-12 MED ORDER — LIDOCAINE HCL 1 % IJ SOLN
INTRAMUSCULAR | Status: AC
  Filled 2011-12-12: qty 20

## 2011-12-12 NOTE — Procedures (Signed)
Successful RUE POWER PICC TIP SVC/RA READY FOR USE NO COMP FULL REPORT IN PACS

## 2011-12-13 ENCOUNTER — Other Ambulatory Visit: Payer: Self-pay | Admitting: Oncology

## 2011-12-15 ENCOUNTER — Ambulatory Visit
Admission: RE | Admit: 2011-12-15 | Discharge: 2011-12-15 | Disposition: A | Discharge status: Home / Self Care | Payer: Self-pay | Source: Ambulatory Visit | Attending: Radiation Oncology | Admitting: Radiation Oncology

## 2011-12-15 ENCOUNTER — Ambulatory Visit (HOSPITAL_BASED_OUTPATIENT_CLINIC_OR_DEPARTMENT_OTHER): Payer: Self-pay

## 2011-12-15 ENCOUNTER — Other Ambulatory Visit (HOSPITAL_BASED_OUTPATIENT_CLINIC_OR_DEPARTMENT_OTHER): Payer: Self-pay | Admitting: Lab

## 2011-12-15 ENCOUNTER — Ambulatory Visit (HOSPITAL_BASED_OUTPATIENT_CLINIC_OR_DEPARTMENT_OTHER): Payer: Self-pay | Admitting: Nurse Practitioner

## 2011-12-15 VITALS — BP 157/95 | HR 83 | Temp 98.7°F | Ht 71.0 in | Wt 156.1 lb

## 2011-12-15 DIAGNOSIS — Z5111 Encounter for antineoplastic chemotherapy: Secondary | ICD-10-CM

## 2011-12-15 DIAGNOSIS — C210 Malignant neoplasm of anus, unspecified: Secondary | ICD-10-CM

## 2011-12-15 DIAGNOSIS — D696 Thrombocytopenia, unspecified: Secondary | ICD-10-CM

## 2011-12-15 LAB — CBC WITH DIFFERENTIAL/PLATELET
BASO%: 0.4 % (ref 0.0–2.0)
LYMPH%: 11.7 % — ABNORMAL LOW (ref 14.0–49.0)
MCHC: 35.3 g/dL (ref 32.0–36.0)
MCV: 91.6 fL (ref 79.3–98.0)
MONO#: 0.8 10*3/uL (ref 0.1–0.9)
MONO%: 6.9 % (ref 0.0–14.0)
Platelets: 114 10*3/uL — ABNORMAL LOW (ref 140–400)
RBC: 4.67 10*6/uL (ref 4.20–5.82)
WBC: 11.4 10*3/uL — ABNORMAL HIGH (ref 4.0–10.3)
nRBC: 0 % (ref 0–0)

## 2011-12-15 LAB — COMPREHENSIVE METABOLIC PANEL
ALT: 19 U/L (ref 0–53)
AST: 21 U/L (ref 0–37)
Alkaline Phosphatase: 80 U/L (ref 39–117)
Creatinine, Ser: 0.81 mg/dL (ref 0.50–1.35)
Total Bilirubin: 0.6 mg/dL (ref 0.3–1.2)

## 2011-12-15 MED ORDER — ONDANSETRON 8 MG/50ML IVPB (CHCC)
8.0000 mg | Freq: Once | INTRAVENOUS | Status: AC
  Administered 2011-12-15: 8 mg via INTRAVENOUS

## 2011-12-15 MED ORDER — HEPARIN SOD (PORK) LOCK FLUSH 100 UNIT/ML IV SOLN
500.0000 [IU] | Freq: Once | INTRAVENOUS | Status: DC | PRN
  Filled 2011-12-15: qty 5

## 2011-12-15 MED ORDER — DEXAMETHASONE SODIUM PHOSPHATE 10 MG/ML IJ SOLN
10.0000 mg | Freq: Once | INTRAMUSCULAR | Status: AC
  Administered 2011-12-15: 10 mg via INTRAVENOUS

## 2011-12-15 MED ORDER — SODIUM CHLORIDE 0.9 % IV SOLN
Freq: Once | INTRAVENOUS | Status: AC
  Administered 2011-12-15: 12:00:00 via INTRAVENOUS

## 2011-12-15 MED ORDER — SODIUM CHLORIDE 0.9 % IJ SOLN
10.0000 mL | INTRAMUSCULAR | Status: DC | PRN
  Filled 2011-12-15: qty 10

## 2011-12-15 MED ORDER — SODIUM CHLORIDE 0.9 % IV SOLN
1000.0000 mg/m2/d | INTRAVENOUS | Status: DC
  Administered 2011-12-15: 7800 mg via INTRAVENOUS
  Filled 2011-12-15: qty 156

## 2011-12-15 MED ORDER — MITOMYCIN CHEMO IV INJECTION 20 MG
10.2000 mg/m2 | Freq: Once | INTRAVENOUS | Status: AC
  Administered 2011-12-15: 20 mg via INTRAVENOUS
  Filled 2011-12-15: qty 40

## 2011-12-15 NOTE — Progress Notes (Signed)
OFFICE PROGRESS NOTE  Interval history:  [name] returns as scheduled. He overall feels well. He notes burning with bowel movements. He occasionally notes blood on the toilet tissue after a bowel movement. Stools are intermittently loose. He takes antidiarrheal medication as needed. He denies mouth sores. No nausea or vomiting   Objective: Blood pressure 157/95, pulse 83, temperature 98.7 F (37.1 C), temperature source Oral, height 5' 11"  (1.803 m), weight 156 lb 1.6 oz (70.806 kg).  Oropharynx is without thrush or ulceration. Lungs are clear. Regular cardiac rhythm. Abdomen soft and nontender. No hepatomegaly. Extremities are without edema. Right upper the PICC site is without erythema. Erythema at the perineum/perianal region/buttocks. No skin breakdown.  Lab Results: Lab Results  Component Value Date   WBC 11.4* 12/15/2011   HGB 15.1 12/15/2011   HCT 42.8 12/15/2011   MCV 91.6 12/15/2011   PLT 114* 12/15/2011    Chemistry:    Chemistry      Component Value Date/Time   NA 135 11/03/2011 0924   K 4.2 11/03/2011 0924   CL 102 11/03/2011 0924   CO2 26 11/03/2011 0924   BUN 11 11/03/2011 0924   CREATININE 0.99 11/03/2011 0924      Component Value Date/Time   CALCIUM 9.1 11/03/2011 0924   ALKPHOS 75 11/03/2011 0924   AST 19 11/03/2011 0924   ALT 16 11/03/2011 0924   BILITOT 0.3 11/03/2011 0924       Studies/Results: Ir Fluoro Guide Cv Line Right  12/12/2011  *RADIOLOGY REPORT*  Clinical Data: Anal cancer, access for chemotherapy  PICC LINE PLACEMENT WITH ULTRASOUND AND FLUOROSCOPIC  GUIDANCE  Fluoroscopy Time: 0.1 minutes.  The right arm was prepped with chlorhexidine, draped in the usual sterile fashion using maximum barrier technique (cap and mask, sterile gown, sterile gloves, large sterile sheet, hand hygiene and cutaneous antisepsis) and infiltrated locally with 1% Lidocaine.  Ultrasound demonstrated patency of the right basilic vein, and this was documented with an image.  Under  real-time ultrasound guidance, this vein was accessed with a 21 gauge micropuncture needle and image documentation was performed.  The needle was exchanged over a guidewire for a peel-away sheath through which a 5 [place] double lumen PICC trimmed to 42 cm was advanced, positioned with its tip at the lower SVC/right atrial junction.  Fluoroscopy during the procedure and fluoro spot radiograph confirms appropriate catheter position.  The catheter was flushed, secured to the skin with Prolene sutures, and covered with a sterile dressing.  Complications:  No immediate  IMPRESSION: Successful right arm PICC line placement with ultrasound and fluoroscopic guidance.  The catheter is ready for use.  Original Report Authenticated By: Jerilynn Mages. Daryll Brod, M.D.   Ir Fluoro Guide Cv Line Right  11/17/2011  *RADIOLOGY REPORT*  Clinical Data: Anal carcinoma; central venous access is requested for chemotherapy.  PICC LINE PLACEMENT WITH ULTRASOUND AND FLUOROSCOPIC  GUIDANCE  Fluoroscopy Time: 0.7 minutes.  The right arm was prepped with chlorhexidine, draped in the usual sterile fashion using maximum barrier technique (cap and mask, sterile gown, sterile gloves, large sterile sheet, hand hygiene and cutaneous antisepsis) and infiltrated locally with 1% Lidocaine.  Ultrasound demonstrated patency of the right basilic vein, and this was documented with an image.  Under real-time ultrasound guidance, this vein was accessed with a 21 gauge micropuncture needle and image documentation was performed.  The needle was exchanged over a guidewire for a peel-away sheath through which a five [place] single lumen PICC trimmed to 45 cm  was advanced, positioned with its tip at the lower SVC/right atrial junction.  Fluoroscopy during the procedure and fluoro spot radiograph confirms appropriate catheter position.  The catheter was flushed, secured to the skin with Prolene sutures, and covered with a sterile dressing.  Complications:  None   IMPRESSION: Successful right arm PICC line placement with ultrasound and fluoroscopic guidance.  The catheter is ready for use.  Read by: Rowe Robert, P.A.-C  Original Report Authenticated By: Rachel Moulds, M.D.   Ir US Guide Vasc Access Right  12/12/2011  *RADIOLOGY REPORT*  Clinical Data: Anal cancer, access for chemotherapy  PICC LINE PLACEMENT WITH ULTRASOUND AND FLUOROSCOPIC  GUIDANCE  Fluoroscopy Time: 0.1 minutes.  The right arm was prepped with chlorhexidine, draped in the usual sterile fashion using maximum barrier technique (cap and mask, sterile gown, sterile gloves, large sterile sheet, hand hygiene and cutaneous antisepsis) and infiltrated locally with 1% Lidocaine.  Ultrasound demonstrated patency of the right basilic vein, and this was documented with an image.  Under real-time ultrasound guidance, this vein was accessed with a 21 gauge micropuncture needle and image documentation was performed.  The needle was exchanged over a guidewire for a peel-away sheath through which a 5 [place] double lumen PICC trimmed to 42 cm was advanced, positioned with its tip at the lower SVC/right atrial junction.  Fluoroscopy during the procedure and fluoro spot radiograph confirms appropriate catheter position.  The catheter was flushed, secured to the skin with Prolene sutures, and covered with a sterile dressing.  Complications:  No immediate  IMPRESSION: Successful right arm PICC line placement with ultrasound and fluoroscopic guidance.  The catheter is ready for use.  Original Report Authenticated By: Jerilynn Mages. Daryll Brod, M.D.   Ir US Guide Vasc Access Right  11/19/2011  *RADIOLOGY REPORT*  Clinical Data: Anal carcinoma; central venous access is requested for chemotherapy.  PICC LINE PLACEMENT WITH ULTRASOUND AND FLUOROSCOPIC  GUIDANCE  Fluoroscopy Time: 0.7 minutes.  The right arm was prepped with chlorhexidine, draped in the usual sterile fashion using maximum barrier technique (cap and mask, sterile gown,  sterile gloves, large sterile sheet, hand hygiene and cutaneous antisepsis) and infiltrated locally with 1% Lidocaine.  Ultrasound demonstrated patency of the right basilic vein, and this was documented with an image.  Under real-time ultrasound guidance, this vein was accessed with a 21 gauge micropuncture needle and image documentation was performed.  The needle was exchanged over a guidewire for a peel-away sheath through which a five [place] single lumen PICC trimmed to 45 cm was advanced, positioned with its tip at the lower SVC/right atrial junction.  Fluoroscopy during the procedure and fluoro spot radiograph confirms appropriate catheter position.  The catheter was flushed, secured to the skin with Prolene sutures, and covered with a sterile dressing.  Complications:  None  IMPRESSION: Successful right arm PICC line placement with ultrasound and fluoroscopic guidance.  The catheter is ready for use.  Read by: Rowe Robert, P.A.-C  Original Report Authenticated By: Rachel Moulds, M.D.    Medications: I have reviewed the patient's current medications.  Assessment/Plan:  1. 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 He began radiation 11/19/2011. He completed cycle #1 5-fluorouracil/mitomycin C beginning 11/17/2011. 2. Insomnia. He takes Valium as needed. 3. Tobacco use. 4. Degenerative disc disease. 5. Mild thrombocytopenia.  Disposition-[name] appears stable. Plan to proceed with cycle 2 5-FU/mitomycin today  as scheduled. We will obtain a followup CBC on 12/24/2011. He will return for a followup visit with Dr. Benay Spice on 01/01/2012. He will contact the office in the interim with any problems. The PICC line will be removed at the completion of cycle 2.  Plan reviewed with Dr. Benay Spice.  Ned Card ANP/GNP-BC

## 2011-12-16 ENCOUNTER — Encounter: Payer: Self-pay | Admitting: Oncology

## 2011-12-16 ENCOUNTER — Ambulatory Visit: Payer: Self-pay

## 2011-12-16 NOTE — Progress Notes (Signed)
Patient received one prescription from [place] on 12/15/11 $3.00,his remaninig balance [facility] $376.52.

## 2011-12-17 ENCOUNTER — Ambulatory Visit
Admission: RE | Admit: 2011-12-17 | Discharge: 2011-12-17 | Disposition: A | Discharge status: Home / Self Care | Payer: Self-pay | Source: Ambulatory Visit | Attending: Radiation Oncology | Admitting: Radiation Oncology

## 2011-12-18 ENCOUNTER — Other Ambulatory Visit: Payer: Self-pay | Admitting: *Deleted

## 2011-12-18 ENCOUNTER — Ambulatory Visit
Admission: RE | Admit: 2011-12-18 | Discharge: 2011-12-18 | Disposition: A | Discharge status: Home / Self Care | Payer: Self-pay | Source: Ambulatory Visit | Attending: Radiation Oncology | Admitting: Radiation Oncology

## 2011-12-18 DIAGNOSIS — C210 Malignant neoplasm of anus, unspecified: Secondary | ICD-10-CM

## 2011-12-18 MED ORDER — OXYCODONE-ACETAMINOPHEN 5-325 MG PO TABS: 1.0000 | ORAL_TABLET | ORAL | Status: DC | PRN

## 2011-12-18 NOTE — Telephone Encounter (Signed)
Call from pt requesting refill on Oxycodone to pick up on 12/19/11.

## 2011-12-19 ENCOUNTER — Encounter: Payer: Self-pay | Admitting: Radiation Oncology

## 2011-12-19 ENCOUNTER — Encounter: Payer: Self-pay | Admitting: Oncology

## 2011-12-19 ENCOUNTER — Ambulatory Visit
Admission: RE | Admit: 2011-12-19 | Discharge: 2011-12-19 | Disposition: A | Discharge status: Home / Self Care | Payer: Self-pay | Source: Ambulatory Visit | Attending: Radiation Oncology | Admitting: Radiation Oncology

## 2011-12-19 ENCOUNTER — Ambulatory Visit (HOSPITAL_BASED_OUTPATIENT_CLINIC_OR_DEPARTMENT_OTHER): Payer: Self-pay

## 2011-12-19 VITALS — BP 122/75 | HR 83 | Temp 98.8°F | Resp 20 | Wt 159.2 lb

## 2011-12-19 VITALS — BP 132/76 | HR 90 | Temp 97.8°F

## 2011-12-19 DIAGNOSIS — C210 Malignant neoplasm of anus, unspecified: Secondary | ICD-10-CM

## 2011-12-19 MED ORDER — BIAFINE EX EMUL
CUTANEOUS | Status: DC | PRN
  Administered 2011-12-19: 12:00:00 via TOPICAL

## 2011-12-19 MED ORDER — SODIUM CHLORIDE 0.9 % IJ SOLN
10.0000 mL | INTRAMUSCULAR | Status: DC | PRN
  Administered 2011-12-19: 10 mL
  Filled 2011-12-19: qty 10

## 2011-12-19 MED ORDER — HEPARIN SOD (PORK) LOCK FLUSH 100 UNIT/ML IV SOLN
500.0000 [IU] | Freq: Once | INTRAVENOUS | Status: DC | PRN
  Filled 2011-12-19: qty 5

## 2011-12-19 MED ORDER — HEPARIN SOD (PORK) LOCK FLUSH 100 UNIT/ML IV SOLN
250.0000 [IU] | Freq: Once | INTRAVENOUS | Status: AC | PRN
  Administered 2011-12-19: 250 [IU]
  Filled 2011-12-19: qty 5

## 2011-12-19 NOTE — Progress Notes (Signed)
12/19/2011 Per Dr. Benay Spice PICC line right arm pulled with tip intact at 42cm (inserted @ 42 cm).  Pressure dressing with vaseline gauze applied and patient instructed to keep dressing on x 24 hours.

## 2011-12-19 NOTE — Progress Notes (Signed)
Pt alert and oriented x 3, in a lot of pain, stated"i'm burnt up", checked patients right groin/thigh area, excorioated and peeling, anso on buttocks erythema also, using baifine cream 2-3 x daily, takes percocet not helping, and valium at night for sleep not helping either much, not taking imodium, not using sitz bath that was given,encouraged to start using 3x day 11:28 AM

## 2011-12-19 NOTE — Progress Notes (Signed)
PICC discontinued

## 2011-12-19 NOTE — Progress Notes (Signed)
Patient received two prescription from [place] on 12/18/11 $11.36,his remaninig balance [facility] $365.16.

## 2011-12-19 NOTE — Progress Notes (Signed)
   Weekly Management Note, anal cancer Current Dose:  3420 cGy  Projected Dose:  5040 cGy   Narrative:  The patient presents for routine under treatment assessment.  CBCT/MVCT images/Port film x-rays were reviewed.  The chart was checked. Patient asked to be seen today. He is developing desquamation in the perineal region. It is quite painful. He is using Biafine. He wonders if there is anything else he can do. He takes baths but does not use the sitz bath.  Physical Findings: Weight: 159 lb 3.2 oz (72.213 kg). Skin exam was deferred. He appears uncomfortable while sitting. He is nontoxic appearing  CBC    Component Value Date/Time   WBC 11.4* 12/15/2011 0953   WBC 11.7* 10/13/2011 1410   RBC 4.67 12/15/2011 0953   RBC 4.77 10/13/2011 1410   HGB 15.1 12/15/2011 0953   HGB 15.2 10/13/2011 1410   HCT 42.8 12/15/2011 0953   HCT 43.6 10/13/2011 1410   PLT 114* 12/15/2011 0953   PLT 186 10/13/2011 1410   MCV 91.6 12/15/2011 0953   MCV 91.4 10/13/2011 1410   MCH 32.3 12/15/2011 0953   MCH 31.9 10/13/2011 1410   MCHC 35.3 12/15/2011 0953   MCHC 34.9 10/13/2011 1410   RDW 14.4 12/15/2011 0953   RDW 12.8 10/13/2011 1410   LYMPHSABS 1.3 12/15/2011 0953   LYMPHSABS 3.4 08/28/2011 1045   MONOABS 0.8 12/15/2011 0953   MONOABS 0.9 08/28/2011 1045   EOSABS 0.5 12/15/2011 0953   EOSABS 0.3 08/28/2011 1045   BASOSABS 0.0 12/15/2011 0953   BASOSABS 0.1 08/28/2011 1045    BMET    Component Value Date/Time   NA 137 12/15/2011 0953   K 4.1 12/15/2011 0953   CL 103 12/15/2011 0953   CO2 26 12/15/2011 0953   GLUCOSE 92 12/15/2011 0953   BUN 8 12/15/2011 0953   CREATININE 0.81 12/15/2011 0953   CALCIUM 9.3 12/15/2011 0953   GFRNONAA >90 10/13/2011 1410   GFRAA >90 10/13/2011 1410     Impression:  The patient is tolerating radiotherapy.  Plan:  Continue radiotherapy as planned. I recommended that he take a bath or sitz bath at least 3 times a day at minimum. I recommended using mild soap.  I recommended continuing Biafine. I told him that if he  uses an electric fan, he can spread his legs while laying in bed and allowed  cool air to blow onto his perineum.  Patient would like to see Dr. Sondra Come after the weekend to followup on how he is doing. This is a good idea. I will request a therapist to send the patient back for this after treatment.

## 2011-12-22 ENCOUNTER — Ambulatory Visit
Admission: RE | Admit: 2011-12-22 | Discharge: 2011-12-22 | Disposition: A | Discharge status: Home / Self Care | Payer: Self-pay | Source: Ambulatory Visit | Attending: Radiation Oncology | Admitting: Radiation Oncology

## 2011-12-22 ENCOUNTER — Encounter: Payer: Self-pay | Admitting: Oncology

## 2011-12-22 ENCOUNTER — Other Ambulatory Visit: Payer: Self-pay | Admitting: Certified Registered Nurse Anesthetist

## 2011-12-22 ENCOUNTER — Encounter: Payer: Self-pay | Admitting: Radiation Oncology

## 2011-12-22 VITALS — BP 139/84 | HR 87 | Temp 97.4°F | Resp 20 | Wt 157.1 lb

## 2011-12-22 DIAGNOSIS — C210 Malignant neoplasm of anus, unspecified: Secondary | ICD-10-CM

## 2011-12-22 MED ORDER — OXYCODONE HCL 20 MG PO TB12: 20.0000 mg | ORAL_TABLET | Freq: Two times a day (BID) | ORAL | Status: DC

## 2011-12-22 MED ORDER — SILVER SULFADIAZINE 1 % EX CREA
TOPICAL_CREAM | Freq: Two times a day (BID) | CUTANEOUS | Status: DC
  Administered 2011-12-22: 1 via TOPICAL

## 2011-12-22 NOTE — Progress Notes (Signed)
Patient received one prescription from [place] on 12/19/11 $5.52,his remaninig balance [facility] $359.64.

## 2011-12-22 NOTE — Progress Notes (Signed)
CC:   Nobie Putnam, M.D.  DIAGNOSIS:  Anal carcinoma.  NARRATIVE:  Mr. Creps is seen today for weekly assessment.  He has completed 20 of 28 planned treatments directed at the pelvis and inguinal area (3600 cGy of a planned 5040 cGy).  The patient is having a lot of pain in the skin of the treatment areas.  The patient has difficulty sitting down.  The patient denies any obvious bleeding from the treatment area.  The patient is able to have bowel movements and to urinate.  The patient does take Percocet 5/325 two every 4 hours with minimal relief.  EXAMINATION:  The patient is quite uncomfortable and is unable to sit in the chair for examination.  Temperature 97.4, pulse 87, respirations 20, blood pressure is 134/84.  Examination of the skin of the treatment area reveals hyperpigmentation and dry desquamation as well as some erythema. In the perineum area, the patient does have some moist desquamation. There is some swelling noted in the scrotal region as well as the penile area.  The patient does have a hernia, which seems to be reducible, in the left inguinal region.  IMPRESSION/PLAN:  The patient is having significant pain with his side effects related to his treatments  in light of this, the patient will have OxyContin added to his pain regimen.  He is to continue using sitz baths 2-3 times a day.  In addition, for the patient's skin reaction, he will be placed on Silvadene.  I did discuss with Mr. Dehoyos a short break in his treatment, but at this point he would like to keep pressing on with his therapy.  I should mention the patient finished his last planned cycle of chemotherapy last week.  Plan is to continue with treatments to 5040 cGy with image-guided IMRT.    ______________________________ Blair Promise, Ph.D., M.D. JDK/MEDQ  D:  12/22/2011  T:  12/22/2011  Job:  [id number]

## 2011-12-22 NOTE — Progress Notes (Signed)
Pt in nursing after radiation tx today to see dr re: skin in tx area. Pt taking Percocet 5/325 mg q 4hr regularly w/very little relief. Applying Biafine, bathing in warm water, sitz bath, baby wipes. He states skin very painful and not getting much relief w/any tx. Dr Sondra Come to see pt today.  Denies diarrhea, nausea.

## 2011-12-23 ENCOUNTER — Ambulatory Visit: Payer: Self-pay

## 2011-12-23 ENCOUNTER — Encounter: Payer: Self-pay | Admitting: Oncology

## 2011-12-23 NOTE — Progress Notes (Signed)
Patient received one prescription from [place] on 12/22/11 $81.30,his remaninig balance [facility] $278.34.

## 2011-12-24 ENCOUNTER — Ambulatory Visit
Admission: RE | Admit: 2011-12-24 | Discharge: 2011-12-24 | Disposition: A | Discharge status: Home / Self Care | Payer: Self-pay | Source: Ambulatory Visit | Attending: Radiation Oncology | Admitting: Radiation Oncology

## 2011-12-24 ENCOUNTER — Ambulatory Visit: Payer: Self-pay

## 2011-12-24 VITALS — BP 129/78 | HR 95 | Temp 97.1°F | Wt 157.9 lb

## 2011-12-24 DIAGNOSIS — C210 Malignant neoplasm of anus, unspecified: Secondary | ICD-10-CM

## 2011-12-24 NOTE — Progress Notes (Signed)
Patient has been on treatment break for 2 days.Has wet desquamation of perineum. Using sitz bath and application of silvadene.Pain has become unbearable. Oxycontin 20 mg ineffective.

## 2011-12-24 NOTE — Progress Notes (Signed)
DIAGNOSIS:  Anal carcinoma.  NARRATIVE:  Martin [name] presented today for further evaluation.  He was placed on OxyContin 20 mg earlier in the week for his severe pain.  This has been ineffective in terms of his pain management.  The patient is continuing to take Percocet for breakthrough pain in addition.  The patient is unable to lie on the treatment table in light of his pain. The patient continues to use Silvadene and uses sitz baths on a frequent basis.  PHYSICAL EXAMINATION:  Vital signs:  On evaluation, the patient's temperature is 97.1, pulse 95, blood pressure is 129/78.  Weight is 157.9 pounds.  The patient is unable to sit in a chair in light of his perineal and perianal pain.  In light of the patient's pain, I have given him a prescription for Dilaudid 4 mg to use every 3-4 hours.  The patient will stop using his Percocet in light of his Dilaudid.  Given the patient's pain severity, he will be on break from his treatments until April 22nd.    ______________________________ Blair Promise, Ph.D., M.D. JDK/MEDQ  D:  12/24/2011  T:  12/24/2011  Job:  [id number]

## 2011-12-25 ENCOUNTER — Ambulatory Visit: Payer: Self-pay

## 2011-12-26 ENCOUNTER — Ambulatory Visit (HOSPITAL_BASED_OUTPATIENT_CLINIC_OR_DEPARTMENT_OTHER): Payer: Self-pay | Admitting: Oncology

## 2011-12-26 ENCOUNTER — Ambulatory Visit (HOSPITAL_BASED_OUTPATIENT_CLINIC_OR_DEPARTMENT_OTHER): Payer: Self-pay

## 2011-12-26 ENCOUNTER — Other Ambulatory Visit: Payer: Self-pay | Admitting: *Deleted

## 2011-12-26 ENCOUNTER — Encounter: Payer: Self-pay | Admitting: Oncology

## 2011-12-26 ENCOUNTER — Ambulatory Visit: Payer: Self-pay

## 2011-12-26 VITALS — BP 129/87 | HR 97 | Temp 97.2°F | Ht 71.0 in | Wt 155.5 lb

## 2011-12-26 DIAGNOSIS — C210 Malignant neoplasm of anus, unspecified: Secondary | ICD-10-CM

## 2011-12-26 DIAGNOSIS — D6959 Other secondary thrombocytopenia: Secondary | ICD-10-CM

## 2011-12-26 DIAGNOSIS — L589 Radiodermatitis, unspecified: Secondary | ICD-10-CM

## 2011-12-26 LAB — CBC WITH DIFFERENTIAL/PLATELET
BASO%: 0.1 % (ref 0.0–2.0)
EOS%: 4.5 % (ref 0.0–7.0)
HCT: 36.7 % — ABNORMAL LOW (ref 38.4–49.9)
LYMPH%: 7.4 % — ABNORMAL LOW (ref 14.0–49.0)
MCH: 33.2 pg (ref 27.2–33.4)
MCHC: 34.4 g/dL (ref 32.0–36.0)
MONO#: 0.7 10*3/uL (ref 0.1–0.9)
NEUT%: 74.1 % (ref 39.0–75.0)
Platelets: 92 10*3/uL — ABNORMAL LOW (ref 140–400)

## 2011-12-26 MED ORDER — DIAZEPAM 5 MG PO TABS: 5.0000 mg | ORAL_TABLET | Freq: Every evening | ORAL | Status: DC | PRN

## 2011-12-26 NOTE — Progress Notes (Signed)
OFFICE PROGRESS NOTE   INTERVAL HISTORY:   He returns as scheduled. He completed a second cycle of 5-FU/mitomycin-C beginning on 12/15/2011. He reports tolerating the chemotherapy well. He denies nausea, mouth sores, and diarrhea following chemotherapy. He reports only a few loose stools. His chief complaint is pain related to skin breakdown at the growing and perineum. He also has difficulty with urinary hesitancy. Radiation was placed on hold this week.  Objective:  Vital signs in last 24 hours:  Blood pressure 129/87, pulse 97, temperature 97.2 F (36.2 C), temperature source Oral, height 5' 11"  (9.407 m), weight 155 lb 8 oz (70.534 kg).    HEENT: No thrush or ulcers Resp: Lungs with bilateral end inspiratory wheezes. No respiratory distress. Cardio: Regular rate and rhythm GI: The abdomen is Soft and Nontender, no hepatomegaly Vascular: No leg edema  Skin: There is moist desquamation at the inferior scrotum and upper medial thigh. Hyperpigmentation and dry desquamation at the groin and perineum.     Lab Results:  Lab Results  Component Value Date   WBC 4.7 12/26/2011   HGB 12.6* 12/26/2011   HCT 36.7* 12/26/2011   MCV 96.7 12/26/2011   PLT 92* 12/26/2011   ANC 3.5    Medications: I have reviewed the patient's current medications.  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 He began radiation 11/19/2011. He completed cycle #1 5-fluorouracil/mitomycin C beginning 11/17/2011.  2.Insomnia. He takes Valium as needed.  3.Tobacco use.   4.Degenerative disc disease.   5.Mild thrombocytopenia secondary to chemotherapy.  6. Skin breakdown at the groin and perineum secondary to radiation/chemotherapy    Disposition:  He completed a second cycle of 5-fluorouracil and mitomycin C. beginning on 12/15/2011. He appears to have tolerated the chemotherapy well. He has  developed significant skin breakdown at the growing and perineum. He will continue radiation and clinical followup with Dr. Sondra Come next week.  He will be scheduled for a CBC next week. He is scheduled for an office visit in medical oncology on 01/09/2012.  He continues narcotic analgesics for treatment of pain related to skin breakdown. The narcotics can be tapered per Dr.Kinard.     Betsy Coder, MD  12/26/2011  5:20 PM

## 2011-12-26 NOTE — Progress Notes (Signed)
Patient received one prescription from [place] on 12/25/11 $4.73,his remaninig balance [facility] $273.61.

## 2011-12-26 NOTE — Telephone Encounter (Signed)
Appts made and printed for pt  aom

## 2011-12-29 ENCOUNTER — Ambulatory Visit: Payer: Self-pay

## 2011-12-29 ENCOUNTER — Other Ambulatory Visit: Payer: Self-pay | Admitting: *Deleted

## 2011-12-29 DIAGNOSIS — C210 Malignant neoplasm of anus, unspecified: Secondary | ICD-10-CM

## 2011-12-30 ENCOUNTER — Ambulatory Visit
Admission: RE | Admit: 2011-12-30 | Discharge: 2011-12-30 | Disposition: A | Discharge status: Home / Self Care | Payer: Self-pay | Source: Ambulatory Visit | Attending: Radiation Oncology | Admitting: Radiation Oncology

## 2011-12-30 VITALS — BP 156/89 | HR 87 | Temp 98.9°F | Wt 156.8 lb

## 2011-12-30 DIAGNOSIS — C210 Malignant neoplasm of anus, unspecified: Secondary | ICD-10-CM

## 2011-12-30 MED ORDER — HYDROMORPHONE HCL 4 MG PO TABS: 4.0000 mg | ORAL_TABLET | ORAL | Status: DC | PRN

## 2011-12-30 NOTE — Progress Notes (Signed)
Here for routine weekly md visit for radiation treatment for anal cancer. Has completed 21 of 28 radiation treatments. Oxycontin causes nausea so no longer taking.Dilaudid effective .Patient able to sit and ambulate much better. Bowel normal and soft.wet desquamation now dry and peeling.decreased redness and drainage.

## 2011-12-30 NOTE — Progress Notes (Signed)
   Department of Radiation Oncology  Phone:  [phone] Fax:        [phone]   Weekly Management Note Current Dose:  3780 cGy  Projected Dose: 5040 cGy   Narrative:  The patient presents for routine under treatment assessment.  MVCT images were reviewed.  The chart was checked.  Physical Findings: Weight: 156 lb 12.8 oz (71.124 kg). Skin is better after break in  treatment and using Silvadene. There is no active areas of moist desquamation.  Impression:  The patient is tolerating radiation better  Plan:  Continue treatment as planned to 5040 cGy. Dilaudid rx refilled.

## 2011-12-31 ENCOUNTER — Telehealth: Payer: Self-pay | Admitting: Oncology

## 2011-12-31 ENCOUNTER — Ambulatory Visit
Admission: RE | Admit: 2011-12-31 | Discharge: 2011-12-31 | Disposition: A | Discharge status: Home / Self Care | Payer: Self-pay | Source: Ambulatory Visit | Attending: Radiation Oncology | Admitting: Radiation Oncology

## 2011-12-31 ENCOUNTER — Encounter: Payer: Self-pay | Admitting: Oncology

## 2011-12-31 ENCOUNTER — Other Ambulatory Visit: Payer: Self-pay | Admitting: *Deleted

## 2011-12-31 DIAGNOSIS — C210 Malignant neoplasm of anus, unspecified: Secondary | ICD-10-CM

## 2011-12-31 MED ORDER — OXYCODONE-ACETAMINOPHEN 5-325 MG PO TABS: 1.0000 | ORAL_TABLET | ORAL | Status: DC | PRN

## 2011-12-31 NOTE — Progress Notes (Signed)
Patient received one prescription from [place] on 12/26/11 $3.00,his remaninig balance [facility] $270.61.

## 2011-12-31 NOTE — Telephone Encounter (Addendum)
Voice mail request for Percocet refill. Called patient and he reports he stopped taking the Oxycontin due to nausea. Taking Dilaudid 4 mg tabs #2 every 4 hours per radiation oncology MD and likes to take #1 Percocet in between these doses to help his pain control. States that his pain is "under control" when he takes meds this way. Reports having #4 Percocet left-wants to pick up script tomorrow. Patient has # 6 more radiation treatments to complete. Per Dr. Lendon Ka skin breakdown from radiation is significant. OK to alternate Dilaudid and Percocet.

## 2011-12-31 NOTE — Telephone Encounter (Signed)
called pts home lmovm for alb appt on 4-25 and to rtn call to confirm appt

## 2012-01-01 ENCOUNTER — Ambulatory Visit: Payer: Self-pay | Admitting: Oncology

## 2012-01-01 ENCOUNTER — Other Ambulatory Visit: Payer: Self-pay | Admitting: Lab

## 2012-01-01 ENCOUNTER — Ambulatory Visit
Admission: RE | Admit: 2012-01-01 | Discharge: 2012-01-01 | Disposition: A | Discharge status: Home / Self Care | Payer: Self-pay | Source: Ambulatory Visit | Attending: Radiation Oncology | Admitting: Radiation Oncology

## 2012-01-01 ENCOUNTER — Other Ambulatory Visit (HOSPITAL_BASED_OUTPATIENT_CLINIC_OR_DEPARTMENT_OTHER): Payer: Self-pay | Admitting: Lab

## 2012-01-01 DIAGNOSIS — C210 Malignant neoplasm of anus, unspecified: Secondary | ICD-10-CM

## 2012-01-01 LAB — CBC WITH DIFFERENTIAL/PLATELET
BASO%: 0.3 % (ref 0.0–2.0)
Basophils Absolute: 0 10*3/uL (ref 0.0–0.1)
EOS%: 5.1 % (ref 0.0–7.0)
HGB: 13.6 g/dL (ref 13.0–17.1)
LYMPH%: 11.5 % — ABNORMAL LOW (ref 14.0–49.0)
MCH: 33.7 pg — ABNORMAL HIGH (ref 27.2–33.4)
MCV: 96.4 fL (ref 79.3–98.0)
MONO%: 13.1 % (ref 0.0–14.0)
RDW: 16.4 % — ABNORMAL HIGH (ref 11.0–14.6)
nRBC: 0 % (ref 0–0)

## 2012-01-02 ENCOUNTER — Ambulatory Visit
Admission: RE | Admit: 2012-01-02 | Discharge: 2012-01-02 | Disposition: A | Discharge status: Home / Self Care | Payer: Self-pay | Source: Ambulatory Visit | Attending: Radiation Oncology | Admitting: Radiation Oncology

## 2012-01-05 ENCOUNTER — Ambulatory Visit
Admission: RE | Admit: 2012-01-05 | Discharge: 2012-01-05 | Disposition: A | Discharge status: Home / Self Care | Payer: Self-pay | Source: Ambulatory Visit | Attending: Radiation Oncology | Admitting: Radiation Oncology

## 2012-01-06 ENCOUNTER — Ambulatory Visit
Admission: RE | Admit: 2012-01-06 | Discharge: 2012-01-06 | Disposition: A | Discharge status: Home / Self Care | Payer: Self-pay | Source: Ambulatory Visit | Attending: Radiation Oncology | Admitting: Radiation Oncology

## 2012-01-06 ENCOUNTER — Encounter: Payer: Self-pay | Admitting: Radiation Oncology

## 2012-01-06 VITALS — Wt 155.4 lb

## 2012-01-06 DIAGNOSIS — C210 Malignant neoplasm of anus, unspecified: Secondary | ICD-10-CM

## 2012-01-06 MED ORDER — HYDROMORPHONE HCL 4 MG PO TABS: 4.0000 mg | ORAL_TABLET | ORAL | Status: DC | PRN

## 2012-01-06 NOTE — Progress Notes (Signed)
   Department of Radiation Oncology  Phone:  [phone] Fax:        [phone]   Weekly Management Note Current Dose:  46.8 Gy  Projected Dose: 50.4 Gy   Narrative:  The patient presents for routine under treatment assessment.  MVCT images were reviewed.  The chart was checked.  He continues to do better after his break in treatment. The  patient is about to run out of his Dilaudid prescription refilled this today. Patient has noticed that his left inguinal hernia as become larger with some more discomfort in this area  Physical Findings: Weight: 155 lb 6.4 oz (70.489 kg). Dry desquamation throughout the treatment area but no moist desquamation at this time. This is left inguinal hernia does appear to be larger but is a easily reducible at this time.  Impression:  The patient is tolerating radiation.  Plan:  Continue treatment as planned. Patient will schedule an appointment with Dr. Harlow Asa  concerning his inguinal hernia hernia repair after he completes his radiation therapy.

## 2012-01-06 NOTE — Progress Notes (Signed)
Pt reports skin in rectal area improving, denies skin is peeling but states "there are little bumps" ; Silvadene 2 x daily. Sitz baths prn. Takes Dilaudid for pain, occass takes Percocet; does get 100% pain relief. Diarrhea 3 days ago; Imodium x 3 tabs w/full relief. Completes Thurs; gave FU card.

## 2012-01-07 ENCOUNTER — Ambulatory Visit
Admission: RE | Admit: 2012-01-07 | Discharge: 2012-01-07 | Disposition: A | Discharge status: Home / Self Care | Payer: Self-pay | Source: Ambulatory Visit | Attending: Radiation Oncology | Admitting: Radiation Oncology

## 2012-01-07 DIAGNOSIS — C210 Malignant neoplasm of anus, unspecified: Secondary | ICD-10-CM

## 2012-01-08 ENCOUNTER — Ambulatory Visit
Admission: RE | Admit: 2012-01-08 | Discharge: 2012-01-08 | Disposition: A | Discharge status: Home / Self Care | Payer: Self-pay | Source: Ambulatory Visit | Attending: Radiation Oncology | Admitting: Radiation Oncology

## 2012-01-08 ENCOUNTER — Encounter: Payer: Self-pay | Admitting: Oncology

## 2012-01-08 NOTE — Progress Notes (Signed)
   Department of Radiation Oncology  Phone:  [phone] Fax:        [phone]   IMRT PLANNING NOTE:   On 11/14/2011 [name] had completion of his IMR T. plaN directed at the pelvis and inguinal area. Im RT was chosen of OVER conventional or conformal radiation therapy to more accurately target area concerned to limit dose to normal surrounding critical structures i.e. the small bowel, femoral head and neck areas. DVH's of the target areas as well as clinical structures are reviewed accepted and placed in the patient's chart. Plan is for the patient to receive 5040 cGy in 28 fractions.  -----------------------------------  Blair Promise, PhD, MD

## 2012-01-08 NOTE — Progress Notes (Signed)
   Department of Radiation Oncology  Phone:  [phone] Fax:        [phone]  IMRT DEVICE NOTE  On 11/19/2011 Martin [name] began his  I MRT directed at the pelvis region. Patient had construction of his  I MRT device on that day and will be treated with 11.4 sinogram segments. This constitutes 1 I MRT device.

## 2012-01-08 NOTE — Progress Notes (Signed)
Patient received one prescription from [place] on 01/06/12 $4.73,his remaninig balance [facility] $265.88.

## 2012-01-08 NOTE — Progress Notes (Signed)
  Radiation Oncology         [phone] ________________________________  Name: Martin [name] MRN: [id number]  Date: 01/07/2012  DOB: 12-23-1960  End of Treatment Note  Diagnosis:   Squamous cell carcinoma of the anus     Indication for treatment:  Definitive treatment along with radiosensitizing chemotherapy       Radiation treatment dates:   11/19/2011 through 01/08/2012  Site/dose:  Pelvis and inguinal area 5040 cGy in 28 fractions  Beams/energy:   Helical IMRT with 6 MV photons  Narrative: The patient tolerated radiation treatment relatively well.  As expected towards the end of his therapy the patient did develop moist desquamation in the perineum and inguinal area. The patient did require a short break in treatment in light of this issue. Patient did have improvement with Silvadene.  Plan: The patient has completed radiation treatment. The patient will return to radiation oncology clinic for routine followup in one month. I advised them to call or return sooner if they have any questions or concerns related to their recovery or treatment.  -----------------------------------  Blair Promise, PhD, MD

## 2012-01-09 ENCOUNTER — Telehealth: Payer: Self-pay | Admitting: Oncology

## 2012-01-09 ENCOUNTER — Other Ambulatory Visit: Payer: Self-pay | Admitting: *Deleted

## 2012-01-09 ENCOUNTER — Ambulatory Visit: Payer: Self-pay

## 2012-01-09 ENCOUNTER — Ambulatory Visit (HOSPITAL_BASED_OUTPATIENT_CLINIC_OR_DEPARTMENT_OTHER): Payer: Self-pay | Admitting: Oncology

## 2012-01-09 ENCOUNTER — Ambulatory Visit: Payer: Self-pay | Admitting: Lab

## 2012-01-09 VITALS — BP 144/83 | HR 87 | Temp 98.0°F | Ht 71.0 in | Wt 152.8 lb

## 2012-01-09 DIAGNOSIS — C210 Malignant neoplasm of anus, unspecified: Secondary | ICD-10-CM

## 2012-01-09 LAB — CBC WITH DIFFERENTIAL/PLATELET
Basophils Absolute: 0 10*3/uL (ref 0.0–0.1)
Eosinophils Absolute: 0.1 10*3/uL (ref 0.0–0.5)
HCT: 38.4 % (ref 38.4–49.9)
HGB: 13.4 g/dL (ref 13.0–17.1)
NEUT#: 6.8 10*3/uL — ABNORMAL HIGH (ref 1.5–6.5)
NEUT%: 83.6 % — ABNORMAL HIGH (ref 39.0–75.0)
RDW: 18.6 % — ABNORMAL HIGH (ref 11.0–14.6)
lymph#: 0.5 10*3/uL — ABNORMAL LOW (ref 0.9–3.3)

## 2012-01-09 MED ORDER — OXYCODONE-ACETAMINOPHEN 5-325 MG PO TABS: 1.0000 | ORAL_TABLET | ORAL | Status: DC | PRN

## 2012-01-09 NOTE — Progress Notes (Signed)
OFFICE PROGRESS NOTE   INTERVAL HISTORY:   He returns as scheduled. He completed radiation yesterday. The pain and skin breakdown have improved. He continues to take oxycodone approximately 4 times per day. He has intermittent "sweats "at night. No fever or shortness of breath. The left inguinal hernia is more prominent. He plans to have the hernia repaired by Dr. Harlow Asa.  Objective:  Vital signs in last 24 hours:  Blood pressure 144/83, pulse 87, temperature 98 F (36.7 C), temperature source Oral, height 5' 11"  (1.803 m), weight 152 lb 12.8 oz (69.31 kg).    HEENT: No thrush or ulcers Resp: Scattered end inspiratory wheeze, good air movement bilaterally, no respiratory distress Cardio: Regular rate and rhythm GI: No hepatosplenomegaly, nontender, reducible left inguinal hernia Vascular: No leg edema  Skin: Hyperpigmentation at the groin and perineum. Minimal skin breakdown at the upper gluteal fold. The erythema and desquamation are much improved.    Lab Results:  Lab Results  Component Value Date   WBC 8.1 01/09/2012   HGB 13.4 01/09/2012   HCT 38.4 01/09/2012   MCV 97.4 01/09/2012   PLT 127* 01/09/2012   ANC 6.8    Medications: I have reviewed the patient's current medications.  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 He began radiation 11/19/2011. He completed cycle #1 5-fluorouracil/mitomycin C beginning 11/17/2011 and cycle #2 12/15/2011. 2.Insomnia. He takes Valium as needed.  3.Tobacco use. I recommended he discontinue tobacco use for 4.Degenerative disc disease.  5.Mild thrombocytopenia secondary to chemotherapy. Improved. 6. Skin breakdown at the groin and perineum secondary to radiation/chemotherapy -improved 7. Left inguinal hernia   Disposition:  He has completed treatment with chemotherapy and radiation for anal cancer. The skin breakdown  appears much improved. He will decrease the use of oxycodone as tolerated. He is scheduled for an office visit in medical oncology and radiation oncology on 02/04/2012.  He plans to schedule an appointment with Dr. Harlow Asa to discuss repair of the left inguinal hernia.   Betsy Coder, MD  01/09/2012  10:24 AM

## 2012-01-09 NOTE — Telephone Encounter (Signed)
Gv pt appt for [id number] sent pt to lab

## 2012-01-13 ENCOUNTER — Other Ambulatory Visit: Payer: Self-pay | Admitting: Radiation Oncology

## 2012-01-13 ENCOUNTER — Encounter: Payer: Self-pay | Admitting: Oncology

## 2012-01-13 MED ORDER — HYDROMORPHONE HCL 4 MG PO TABS: 4.0000 mg | ORAL_TABLET | ORAL | Status: DC | PRN

## 2012-01-13 NOTE — Progress Notes (Signed)
Patient received one prescription from [place] on 01/09/12 $5.52,his remaninig balance [facility] $260.36.

## 2012-01-14 ENCOUNTER — Ambulatory Visit (INDEPENDENT_AMBULATORY_CARE_PROVIDER_SITE_OTHER): Payer: Self-pay | Admitting: Surgery

## 2012-01-14 DIAGNOSIS — K4090 Unilateral inguinal hernia, without obstruction or gangrene, not specified as recurrent: Secondary | ICD-10-CM

## 2012-01-15 ENCOUNTER — Encounter: Payer: Self-pay | Admitting: Oncology

## 2012-01-15 NOTE — Progress Notes (Signed)
Patient received one prescription from [place] on 01/14/12 $4.54,his remaninig balance [facility] $255.82.

## 2012-01-19 ENCOUNTER — Other Ambulatory Visit: Payer: Self-pay | Admitting: *Deleted

## 2012-01-19 ENCOUNTER — Other Ambulatory Visit: Payer: Self-pay | Admitting: Radiation Oncology

## 2012-01-19 DIAGNOSIS — C210 Malignant neoplasm of anus, unspecified: Secondary | ICD-10-CM

## 2012-01-19 MED ORDER — OXYCODONE-ACETAMINOPHEN 5-325 MG PO TABS: 1.0000 | ORAL_TABLET | ORAL | Status: DC | PRN

## 2012-01-19 MED ORDER — HYDROMORPHONE HCL 4 MG PO TABS: 4.0000 mg | ORAL_TABLET | ORAL | Status: AC | PRN

## 2012-01-19 MED ORDER — HYDROMORPHONE HCL 4 MG PO TABS: 4.0000 mg | ORAL_TABLET | ORAL | Status: DC | PRN

## 2012-01-20 ENCOUNTER — Encounter: Payer: Self-pay | Admitting: Oncology

## 2012-01-20 ENCOUNTER — Encounter (INDEPENDENT_AMBULATORY_CARE_PROVIDER_SITE_OTHER): Payer: Self-pay | Admitting: Surgery

## 2012-01-20 ENCOUNTER — Ambulatory Visit (INDEPENDENT_AMBULATORY_CARE_PROVIDER_SITE_OTHER): Payer: Self-pay | Admitting: Surgery

## 2012-01-20 VITALS — BP 114/72 | HR 98 | Temp 98.4°F | Resp 16 | Ht 71.0 in | Wt 152.8 lb

## 2012-01-20 DIAGNOSIS — C210 Malignant neoplasm of anus, unspecified: Secondary | ICD-10-CM

## 2012-01-20 DIAGNOSIS — K4090 Unilateral inguinal hernia, without obstruction or gangrene, not specified as recurrent: Secondary | ICD-10-CM

## 2012-01-20 NOTE — Patient Instructions (Signed)
[facility]  UMBILICAL OR INGUINAL HERNIA REPAIR POST OP INSTRUCTIONS  Always review your discharge instruction sheet given to you by the facility where your surgery was performed.  1. A  prescription for pain medication may be given to you upon discharge.  Take your pain medication as prescribed, if needed.  If narcotic pain medicine is not needed, then you may take acetaminophen (Tylenol) or ibuprofen (Advil) as needed. 2. Take your usually prescribed medications unless otherwise directed. 3. If you need a refill on your pain medication, please contact your pharmacy.  They will contact our office to request authorization. Prescriptions will not be filled after 5 pm daily or on weekends. 4. You should follow a light diet the first 24 hours after arrival home, such as soup and crackers, etc.  Be sure to include lots of fluids daily.  Resume your normal diet the day after surgery. 5. Most patients will experience some swelling and bruising around the umbilicus or in the groin and scrotum.  Ice packs and reclining will help.  Swelling and bruising can take several days to resolve.  6. It is common to experience some constipation if taking pain medication after surgery.  Increasing fluid intake and taking a stool softener (such as Colace) will usually help or prevent this problem from occurring.  A mild laxative (Milk of Magnesia or Miralax) should be taken according to package directions if there are no bowel movements after 48 hours. 7. Unless discharge instructions indicate otherwise, you may remove your bandages 24-48 hours after surgery, and you may shower at that time.  You may have steri-strips (small skin tapes) in place directly over the incision.  These strips should be left on the skin for 7-10 days.  If your surgeon used skin glue on the incision, you may shower in 24 hours.  The glue will flake off over the next 2-3 weeks.  Any sutures or staples will be removed at the office  during your follow-up visit. 8. ACTIVITIES:  You may resume regular (light) daily activities beginning the next day--such as daily self-care, walking, climbing stairs--gradually increasing activities as tolerated.  You may have sexual intercourse when it is comfortable.  Refrain from any heavy lifting or straining until approved by your doctor.  You may drive when you are no longer taking prescription pain medication, you can comfortably wear a seatbelt, and you can safely maneuver your car and apply brakes. 9. You should see your doctor in the office for a follow-up appointment approximately 2-3 weeks after your surgery.  Make sure that you call for this appointment within a day or two after you arrive home to insure a convenient appointment time.  WHEN TO CALL YOUR DOCTOR: 1. Fever over 101.0 2. Inability to urinate 3. Nausea and/or vomiting 4. Extreme swelling or bruising 5. Continued bleeding from incision. 6. Increased pain, redness, or drainage from the incision The clinic staff is available to answer your questions during regular business hours.  Please don't hesitate to call and ask to speak to one of the nurses for clinical concerns.  If you have a medical emergency, go to the nearest emergency room or call 911.  A surgeon from [facility] is always on call at the hospital.   [address], [place], [place], [place]  [address]  [address], [place], [place]   [address] [phone] ? [phone] ? FAX [phone] Website: [email or web address]

## 2012-01-20 NOTE — Progress Notes (Signed)
Visit Diagnoses: 1. Anal cancer   2. Inguinal hernia unilateral, non-recurrent, left     HISTORY: The patient is a 51 year old white male who has been undergoing treatment for squamous cell carcinoma of the anus. He has completed radiation therapy and chemotherapy. Patient has a symptomatic left inguinal hernia. He now desires repair.  PERTINENT REVIEW OF SYSTEMS: No obstructive symptoms, mild urinary symptoms  EXAM: HEENT: normocephalic; pupils equal and reactive; sclerae clear; dentition good; mucous membranes moist NECK:  symmetric on extension; no palpable anterior or posterior cervical lymphadenopathy; no supraclavicular masses; no tenderness CHEST: clear to auscultation bilaterally without rales, rhonchi, or wheezes CARDIAC: regular rate and rhythm without significant murmur; peripheral pulses are full GU:  Perineum shows hyperpigmentation consistent with recent radiation therapy. Right inguinal canal shows slight laxity but no defect. Obvious bulge in the left inguinal canal. On palpation there is a moderate hernia which is reducible and mildly tender. EXT:  non-tender without edema; no deformity NEURO: no gross focal deficits; no sign of tremor   IMPRESSION: #1 history of squamous cell carcinoma of the anus, status post chemotherapy and radiation therapy and surgical resection #2 left inguinal hernia, reducible, symptomatic  PLAN: The patient and I discussed the indications for repair of his left inguinal hernia. We discussed the use of prosthetic mesh. We discussed the possibility of recurrence. We discussed restrictions on his activities following the procedure. He understands and wishes to proceed in the near future. We will make arrangements for surgery as an outpatient at a time convenient for him.  The risks and benefits of the procedure have been discussed at length with the patient.  The patient understands the proposed procedure, potential alternative treatments, and the  course of recovery to be expected.  All of the patient's questions have been answered at this time.  The patient wishes to proceed with surgery.   Earnstine Regal, MD, [facility]

## 2012-01-20 NOTE — Progress Notes (Signed)
Patient received one prescription from [place] on 01/20/12 $4.54,his remaninig balance [facility] $251.28.

## 2012-01-21 ENCOUNTER — Encounter: Payer: Self-pay | Admitting: Oncology

## 2012-01-21 NOTE — Progress Notes (Signed)
Patient received one prescription from [place] on 01/20/12 $5.52,his remaninig balance [facility] $245.76.

## 2012-01-26 ENCOUNTER — Other Ambulatory Visit: Payer: Self-pay | Admitting: Radiation Oncology

## 2012-01-26 MED ORDER — HYDROMORPHONE HCL 4 MG PO TABS: 4.0000 mg | ORAL_TABLET | ORAL | Status: DC | PRN

## 2012-01-26 NOTE — Progress Notes (Signed)
PATIENT CALLED WANTING A REFILL ON HIS DILAUDID 4MG , 4 TABLETS Q4HRS PRN PAIN.  DR. Sondra Come PRINTED RX FOR #30.  PATIENT TO COME TODAY TO PICK UP

## 2012-01-28 ENCOUNTER — Other Ambulatory Visit: Payer: Self-pay | Admitting: *Deleted

## 2012-01-28 NOTE — Telephone Encounter (Signed)
Pt called for Oxycodone refill. He reports he is not out of medication, just calling in advance. Informed him he can pick up rx on 5/24. He verbalized understanding.

## 2012-01-30 ENCOUNTER — Other Ambulatory Visit: Payer: Self-pay | Admitting: Oncology

## 2012-01-30 ENCOUNTER — Other Ambulatory Visit: Payer: Self-pay | Admitting: *Deleted

## 2012-01-30 DIAGNOSIS — C210 Malignant neoplasm of anus, unspecified: Secondary | ICD-10-CM

## 2012-01-30 MED ORDER — OXYCODONE-ACETAMINOPHEN 5-325 MG PO TABS: 1.0000 | ORAL_TABLET | ORAL | Status: DC | PRN

## 2012-01-30 NOTE — Telephone Encounter (Signed)
Pt requesting refill of Percocet. Reviewed with Dr. Benay Spice: Will decrease number of tabs. Pain should be improving. Pt has completed treatment.

## 2012-02-03 ENCOUNTER — Other Ambulatory Visit: Payer: Self-pay | Admitting: Oncology

## 2012-02-03 ENCOUNTER — Encounter: Payer: Self-pay | Admitting: Oncology

## 2012-02-03 NOTE — Progress Notes (Signed)
Patient received one prescription from [facility] on 01/30/12,his remaninig balance [facility] $242.42.

## 2012-02-04 ENCOUNTER — Encounter: Payer: Self-pay | Admitting: Radiation Oncology

## 2012-02-04 ENCOUNTER — Ambulatory Visit (HOSPITAL_BASED_OUTPATIENT_CLINIC_OR_DEPARTMENT_OTHER): Payer: Self-pay | Admitting: Oncology

## 2012-02-04 ENCOUNTER — Telehealth: Payer: Self-pay | Admitting: Oncology

## 2012-02-04 ENCOUNTER — Ambulatory Visit
Admission: RE | Admit: 2012-02-04 | Discharge: 2012-02-04 | Disposition: A | Discharge status: Home / Self Care | Payer: Self-pay | Source: Ambulatory Visit | Attending: Radiation Oncology | Admitting: Radiation Oncology

## 2012-02-04 VITALS — BP 140/83 | HR 90 | Resp 18 | Wt 148.4 lb

## 2012-02-04 VITALS — BP 148/92 | HR 91 | Temp 97.0°F | Ht 71.0 in | Wt 148.3 lb

## 2012-02-04 DIAGNOSIS — T451X5A Adverse effect of antineoplastic and immunosuppressive drugs, initial encounter: Secondary | ICD-10-CM

## 2012-02-04 DIAGNOSIS — C210 Malignant neoplasm of anus, unspecified: Secondary | ICD-10-CM

## 2012-02-04 DIAGNOSIS — D6959 Other secondary thrombocytopenia: Secondary | ICD-10-CM

## 2012-02-04 DIAGNOSIS — G4700 Insomnia, unspecified: Secondary | ICD-10-CM

## 2012-02-04 MED ORDER — HYDROMORPHONE HCL 4 MG PO TABS: 4.0000 mg | ORAL_TABLET | ORAL | Status: AC | PRN

## 2012-02-04 NOTE — Progress Notes (Signed)
   [facility]    OFFICE PROGRESS NOTE   INTERVAL HISTORY:   He returns as scheduled. There has been significant improvement in the skin breakdown at the perineum and groin. No constipation or difficulty with bowel movements. He continues to have pain related to the inguinal hernia. He is taking Dilaudid and oxycodone for the hernia pain and general abdominal pain.  Objective:  Vital signs in last 24 hours:  Blood pressure 148/92, pulse 91, temperature 97 F (36.1 C), temperature source Oral, height 5' 11"  (1.803 m), weight 148 lb 4.8 oz (67.268 kg).    HEENT: No thrush or ulcers Resp: Scattered and inspiratory rhonchi at the posterior chest bilaterally, good air movement bilaterally, no respiratory distress Cardio: Regular rate and rhythm GI: Nontender, no hepatomegaly, no mass, no apparent ascites, I cannot appreciate an inguinal hernia today Vascular: No leg edema  Skin: Radiation hyperpigmentation at the groin and perineum. No skin breakdown. 2 less than 1 cm pain slightly nodular areas at the left perineum. No fluctuance     Medications: I have reviewed the patient's current medications.  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 He began radiation 11/19/2011. He completed cycle #1 5-fluorouracil/mitomycin C beginning 11/17/2011 and cycle #2 12/15/2011.  2.Insomnia. He takes Valium as needed.  3.Tobacco use. I recommended he discontinue tobacco use for  4.Degenerative disc disease.  5.Mild thrombocytopenia secondary to chemotherapy. Improved.  6. Skin breakdown at the groin and perineum secondary to radiation/chemotherapy -resolved. There is residual hyperpigmentation and 2 or 3 "pink "nodular areas at the left side of the parametrium 7. Left inguinal hernia -he reports pain related to the hernia. He would like to have the hernia repaired, but  he cannot afford this procedure at present  Disposition:  He has recovered from the chemotherapy and radiation. I heard him to decrease the use of narcotics. He will return for an office visit in one month. Mr. Deyton will contact us in the interim for new symptoms. The slightly nodular areas at the left side of the perineum are likely related to healing ulcerations.   Betsy Coder, MD  02/04/2012  11:46 AM

## 2012-02-04 NOTE — Telephone Encounter (Signed)
Gv pt appt for [id number]

## 2012-02-04 NOTE — Progress Notes (Signed)
Patient presents to the clinic today for a follow up appointment with Dr. Sondra Come. Patient is alert and oriented to person, place, and time. No distress noted. Steady gait noted. Pleasant affect noted. Patient reports constant aching low abdominal pain and anal pain 8 on a scale of 0-10. Patient reports increased burning anal pain with bowel movement. Patient relates the majority of his pain to effects of abdominal hernia repair. Patient reports he has been unable to get his hernia repaired because he doesn't have $900 to pay the surgeon. Therefore, he has applied for Medicaid but, awaiting his approval. Also, patient is working on paperwork for disability. Patient states,"I don't want to be on disability I want to work but, I need this surgery." Intermittent nausea reported. Patient reports frequent diarrhea. Patient reports extreme fatigue. Approximately 5 pound weight loss noted since 01/20/2012. Patient reports he has not been supplementing with Ensure or Boost because he can't afford to buy it. Patient reports he needs another Valium and Dilaudid prescription. Reported all findings to Dr. Sondra Come.

## 2012-02-04 NOTE — Progress Notes (Signed)
  Radiation Oncology         [phone]) [phone] ________________________________  Name: Martin [name] MRN: [id number]  Date: 02/04/2012  DOB: 09/07/1961  Follow-Up Visit Note  CC: Provider Not In System  Gerkin, Merlinda Frederick, MD  Diagnosis:   Anal cancer  Interval Since Last Radiation:  4 weeks  Narrative:  The patient returns today for routine follow-up.  He continues to have pain in the perirectal area or he also is having discomfort from his left inguinal hernia. This denies any nausea or constipation. He did have some diarrhea over the weekend. He denies any rectal bleeding or urination difficulties.                              ALLERGIES:  is allergic to aspirin; codeine; and morphine and related.  Meds: Current Outpatient Prescriptions  Medication Sig Dispense Refill  . HYDROmorphone (DILAUDID) 4 MG tablet Take 1 tablet (4 mg total) by mouth every 4 (four) hours as needed for pain.  40 tablet  0  . loperamide (IMODIUM) 2 MG capsule TAKE 1 CAPSULE BY MOUTH FOUR TIMES DAILY AS NEEDED FOR DIARRHEA OR LOOSE STOOLS  30 capsule  1  . oxyCODONE-acetaminophen (PERCOCET) 5-325 MG per tablet Take 1 tablet by mouth every 4 (four) hours as needed for pain.  30 tablet  0  . prochlorperazine (COMPAZINE) 10 MG tablet TAKE 1 TABLET BY MOUTH EVERY 6 HOURS AS NEEDED  60 tablet  0  . diazepam (VALIUM) 5 MG tablet TAKE 1 TABLET BY MOUTH AT BEDTIME AS NEEDED  30 tablet  0  . docusate sodium (COLACE) 100 MG capsule Take 100 mg by mouth 2 (two) times daily as needed. For constipation      . emollient (BIAFINE) cream Apply 170 g topically 2 (two) times daily as needed.      . psyllium (METAMUCIL) 0.52 G capsule Take 0.52 g by mouth 2 (two) times daily.        Physical Findings: The patient is in no acute distress. Patient is alert and oriented.  weight is 148 lb 6.4 oz (67.314 kg). His blood pressure is 140/83 and his pulse is 90. His respiration is 18. .  The lungs are clear. The heart has regular rhythm and  rate. The abdomen is soft and nontender with normal bowel sounds. The inguinal areas are free of adenopathy. The patient's skin is healed well at this time except for some mild skin breakdown  superior to the perirectal region. There's no signs of infection noted. Rectal exam is not performed in light of  The patient's recent completion of treatment. The patient's left inguinal hernia is easily reducible.  Lab Findings: Lab Results  Component Value Date   WBC 8.1 01/09/2012   HGB 13.4 01/09/2012   HCT 38.4 01/09/2012   MCV 97.4 01/09/2012   PLT 127* 01/09/2012    @LASTCHEM @  Radiographic Findings: No results found.  Impression:  The patient is recovering from the effects of radiation.  I have refilled the patient's Dilaudid. I've also recommended he start slowly tapering on this medication. Patient is applying for disability/medicaid. Once this is approved he will hopefully proceed with his inguinal surgery.  Plan:  Followup in 3 months.  _____________________________________  -----------------------------------  Blair Promise, PhD, MD

## 2012-02-06 ENCOUNTER — Encounter: Payer: Self-pay | Admitting: Oncology

## 2012-02-06 NOTE — Progress Notes (Signed)
Patient received four prescriptions from [facility] on 02/04/12 $16.85,his remaninig balance [facility] $225.57.

## 2012-02-09 ENCOUNTER — Telehealth: Payer: Self-pay | Admitting: *Deleted

## 2012-02-09 ENCOUNTER — Other Ambulatory Visit: Payer: Self-pay | Admitting: *Deleted

## 2012-02-09 DIAGNOSIS — C210 Malignant neoplasm of anus, unspecified: Secondary | ICD-10-CM

## 2012-02-09 MED ORDER — OXYCODONE-ACETAMINOPHEN 5-325 MG PO TABS: 1.0000 | ORAL_TABLET | Freq: Two times a day (BID) | ORAL | Status: DC | PRN

## 2012-02-09 MED ORDER — OXYCODONE-ACETAMINOPHEN 5-325 MG PO TABS: 1.0000 | ORAL_TABLET | ORAL | Status: DC | PRN

## 2012-02-09 NOTE — Telephone Encounter (Signed)
Spoke with pt, he reports he is taking pain medicine for rectal pain. Oxycodone seems to help more than Dilaudid. Informed pt that he should only get pain meds from one MD. Dr. Benay Spice refilled Percocet to be taken up to twice daily, will not refill Dilaudid.

## 2012-02-09 NOTE — Telephone Encounter (Signed)
Message from pt requesting refill of Oxycodone. Stated he is taking Dilaudid daily with little relief of pain. Left message on voicemail for pt to call office.

## 2012-02-12 ENCOUNTER — Encounter: Payer: Self-pay | Admitting: Oncology

## 2012-02-12 NOTE — Progress Notes (Signed)
Patient received one prescription from [facility] on 02/09/12,his remaninig balance [facility] $222.23.

## 2012-02-20 ENCOUNTER — Other Ambulatory Visit: Payer: Self-pay | Admitting: *Deleted

## 2012-02-20 DIAGNOSIS — C210 Malignant neoplasm of anus, unspecified: Secondary | ICD-10-CM

## 2012-02-20 MED ORDER — OXYCODONE-ACETAMINOPHEN 5-325 MG PO TABS: 1.0000 | ORAL_TABLET | Freq: Two times a day (BID) | ORAL | Status: DC | PRN

## 2012-02-20 NOTE — Telephone Encounter (Signed)
Patient reports he is out of his Percocet and says "my stomach and my butt hurt real bad". Rates his pain "10". Insists he only takes his pain med twice daily, but admits after nurse questioned the quantity ordered at last fill that he may have taken 3/day on one or two occasions. Dr. Benay Spice agrees to order enough for #2/day to last until his visit on 6/21. Will then discuss further need for pain medication/management. Patient understands and agrees.

## 2012-02-27 ENCOUNTER — Ambulatory Visit: Payer: Self-pay | Admitting: Oncology

## 2012-02-27 ENCOUNTER — Other Ambulatory Visit: Payer: Self-pay | Admitting: *Deleted

## 2012-02-27 ENCOUNTER — Telehealth: Payer: Self-pay | Admitting: *Deleted

## 2012-02-27 ENCOUNTER — Telehealth: Payer: Self-pay | Admitting: Oncology

## 2012-02-27 NOTE — Telephone Encounter (Signed)
called pts and scheduled appt for 06/25

## 2012-02-27 NOTE — Telephone Encounter (Signed)
Phone call to patient regarding this morning's missed appointment.  Patient stated he forgot, but really wants to see the doctor.  Dr. Benay Spice informed and said to re-schedule patient with him or NP for next available.  Patient was informed and he understands he will be called with appointment from the scheduler.

## 2012-02-28 ENCOUNTER — Telehealth: Payer: Self-pay | Admitting: *Deleted

## 2012-02-28 NOTE — Telephone Encounter (Signed)
Pt called 02/27/12 stating he needed to see Dr. Benay Spice b/c he is having lots of problems & some stomach pain.  He reports that when he turns in bed it feels like his intestines are moving.  He can be reached at [phone] or [phone].  He did know about his appt for 02/27/12 but not clear why he didn't come.  Encouraged to keep scheduled appts.  He was told nothing available before 03/15/12 by scheduler.  Noted today 02/28/12 that he does have an appt for 03/02/12 with Ned Card NP.  Left message today to that effect.

## 2012-03-01 ENCOUNTER — Telehealth: Payer: Self-pay

## 2012-03-01 NOTE — Telephone Encounter (Signed)
Patient called requesting refill on xeloda.Informed patient this medication no longer on medication list but does have percocet on med list.Dr.Kinard out of office.Patient scheduled to see NP in med/onc tomorrow and will address pain needs at that time.

## 2012-03-02 ENCOUNTER — Other Ambulatory Visit: Payer: Self-pay | Admitting: Oncology

## 2012-03-02 ENCOUNTER — Ambulatory Visit (HOSPITAL_BASED_OUTPATIENT_CLINIC_OR_DEPARTMENT_OTHER): Payer: Self-pay | Admitting: Nurse Practitioner

## 2012-03-02 ENCOUNTER — Telehealth: Payer: Self-pay | Admitting: Oncology

## 2012-03-02 ENCOUNTER — Ambulatory Visit (HOSPITAL_BASED_OUTPATIENT_CLINIC_OR_DEPARTMENT_OTHER): Payer: Self-pay

## 2012-03-02 VITALS — BP 133/83 | HR 98 | Temp 98.0°F | Ht 71.0 in | Wt 145.0 lb

## 2012-03-02 DIAGNOSIS — C211 Malignant neoplasm of anal canal: Secondary | ICD-10-CM

## 2012-03-02 DIAGNOSIS — R350 Frequency of micturition: Secondary | ICD-10-CM

## 2012-03-02 DIAGNOSIS — G4700 Insomnia, unspecified: Secondary | ICD-10-CM

## 2012-03-02 DIAGNOSIS — R109 Unspecified abdominal pain: Secondary | ICD-10-CM

## 2012-03-02 DIAGNOSIS — C210 Malignant neoplasm of anus, unspecified: Secondary | ICD-10-CM

## 2012-03-02 DIAGNOSIS — R197 Diarrhea, unspecified: Secondary | ICD-10-CM

## 2012-03-02 DIAGNOSIS — D696 Thrombocytopenia, unspecified: Secondary | ICD-10-CM

## 2012-03-02 LAB — URINALYSIS, MICROSCOPIC - CHCC
Blood: NEGATIVE
Ketones: NEGATIVE mg/dL
Nitrite: NEGATIVE
Protein: NEGATIVE mg/dL
Specific Gravity, Urine: 1.015 (ref 1.003–1.035)
pH: 6 (ref 4.6–8.0)

## 2012-03-02 MED ORDER — OXYCODONE-ACETAMINOPHEN 5-325 MG PO TABS: 1.0000 | ORAL_TABLET | Freq: Two times a day (BID) | ORAL | Status: DC | PRN

## 2012-03-02 NOTE — Telephone Encounter (Signed)
Gv pt appt [id number]

## 2012-03-02 NOTE — Progress Notes (Signed)
OFFICE PROGRESS NOTE  Interval history:  Mr. Chirico returns as scheduled. He reports a one-month history of abdominal pain and diarrhea. The pain is mainly at the left lower abdomen. He intermittently notes pain at the right lower abdomen as well. He is taking Percocet as needed. He denies hematochezia. He intermittently notes black stools. He denies fever. No nausea or vomiting. He reports urinary frequency.   Objective: Blood pressure 133/83, pulse 98, temperature 98 F (36.7 C), temperature source Oral, height 5' 11"  (1.803 m), weight 145 lb (65.772 kg).  Oropharynx is without thrush or ulceration. Lungs are clear. Regular cardiac rhythm. Abdomen is soft with mild diffuse tenderness. Bowel sounds hyperactive. No hepatomegaly. Radiation hyperpigmentation at the groin and perineum. 2 small scabbed lesions at the left perianal region. Extremities without edema.  Lab Results: Lab Results  Component Value Date   WBC 8.1 01/09/2012   HGB 13.4 01/09/2012   HCT 38.4 01/09/2012   MCV 97.4 01/09/2012   PLT 127* 01/09/2012    Chemistry:    Chemistry      Component Value Date/Time   NA 137 12/15/2011 0953   K 4.1 12/15/2011 0953   CL 103 12/15/2011 0953   CO2 26 12/15/2011 0953   BUN 8 12/15/2011 0953   CREATININE 0.81 12/15/2011 0953      Component Value Date/Time   CALCIUM 9.3 12/15/2011 0953   ALKPHOS 80 12/15/2011 0953   AST 21 12/15/2011 0953   ALT 19 12/15/2011 0953   BILITOT 0.6 12/15/2011 0953       Studies/Results: No results found.  Medications: I have reviewed the patient's current medications.  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 He began radiation 11/19/2011. He completed cycle #1 5-fluorouracil/mitomycin C beginning 11/17/2011 and cycle #2 12/15/2011.  2.Insomnia. He takes Valium as needed.  3.Tobacco use. 4.Degenerative disc disease.  5.History of mild  thrombocytopenia secondary to chemotherapy.  6. Skin breakdown at the groin and perineum secondary to radiation/chemotherapy. Resolved.  7. Left inguinal hernia.  8. One month history of abdominal pain and diarrhea.  Disposition-the etiology of the abdominal pain and diarrhea is unclear. He will submit a stool sample for C. difficile testing. We are also obtaining a urinalysis. He was given a new Percocet prescription today. He will return for a followup visit in 3 weeks. He will contact the office prior to that visit with increased pain.  Plan reviewed with Dr. Benay Spice.   Ned Card ANP/GNP-BC

## 2012-03-15 ENCOUNTER — Other Ambulatory Visit: Payer: Self-pay | Admitting: Oncology

## 2012-03-15 ENCOUNTER — Telehealth: Payer: Self-pay | Admitting: *Deleted

## 2012-03-15 DIAGNOSIS — R109 Unspecified abdominal pain: Secondary | ICD-10-CM

## 2012-03-15 DIAGNOSIS — C210 Malignant neoplasm of anus, unspecified: Secondary | ICD-10-CM

## 2012-03-15 MED ORDER — OXYCODONE-ACETAMINOPHEN 5-325 MG PO TABS: 1.0000 | ORAL_TABLET | Freq: Four times a day (QID) | ORAL | Status: DC | PRN

## 2012-03-15 NOTE — Telephone Encounter (Signed)
Attached appointment to script with directions to increase Imodium to #2 capsules qid and to push po fluids and to eat bland diet-avoid spicy, greasy foods.

## 2012-03-15 NOTE — Telephone Encounter (Signed)
Patient reports abdominal cramping pain "all the time" with baseline at "8" and increases to "10+" after he eats anything. Takes Percocet 3-4 times/day to get pain down to level "3". Bowels sound hyperactive to him. Liquid stools after all po intake. Taking his Metamucil daily as instructed and Lomotil not effective. Has started taking Imodium, but only taking 2-4/day. Is losing weight according to how his clothes fit. Needs refill on pain med now and needs to be seen this week. Made him aware that his stool test last week was negative for c diff. Per Dr. Benay Spice: Will refill Percocet for #15 and have him seen on 7/10 by [facility] with labs.

## 2012-03-17 ENCOUNTER — Encounter: Payer: Self-pay | Admitting: Internal Medicine

## 2012-03-17 ENCOUNTER — Ambulatory Visit: Payer: Self-pay | Admitting: Nurse Practitioner

## 2012-03-17 ENCOUNTER — Other Ambulatory Visit (HOSPITAL_BASED_OUTPATIENT_CLINIC_OR_DEPARTMENT_OTHER): Payer: Self-pay | Admitting: Lab

## 2012-03-17 VITALS — BP 129/81 | HR 77 | Temp 98.2°F | Ht 71.0 in | Wt 145.6 lb

## 2012-03-17 DIAGNOSIS — R109 Unspecified abdominal pain: Secondary | ICD-10-CM

## 2012-03-17 DIAGNOSIS — C210 Malignant neoplasm of anus, unspecified: Secondary | ICD-10-CM

## 2012-03-17 DIAGNOSIS — R197 Diarrhea, unspecified: Secondary | ICD-10-CM

## 2012-03-17 LAB — CBC WITH DIFFERENTIAL/PLATELET
Basophils Absolute: 0 10*3/uL (ref 0.0–0.1)
EOS%: 4.3 % (ref 0.0–7.0)
Eosinophils Absolute: 0.3 10*3/uL (ref 0.0–0.5)
HCT: 40.7 % (ref 38.4–49.9)
HGB: 13.9 g/dL (ref 13.0–17.1)
MCH: 35.7 pg — ABNORMAL HIGH (ref 27.2–33.4)
MCV: 104.3 fL — ABNORMAL HIGH (ref 79.3–98.0)
MONO%: 6.9 % (ref 0.0–14.0)
NEUT#: 5.7 10*3/uL (ref 1.5–6.5)
NEUT%: 78.8 % — ABNORMAL HIGH (ref 39.0–75.0)
Platelets: 139 10*3/uL — ABNORMAL LOW (ref 140–400)

## 2012-03-17 LAB — BASIC METABOLIC PANEL
BUN: 8 mg/dL (ref 6–23)
Creatinine, Ser: 0.83 mg/dL (ref 0.50–1.35)
Glucose, Bld: 92 mg/dL (ref 70–99)
Potassium: 3.7 mEq/L (ref 3.5–5.3)

## 2012-03-17 MED ORDER — OXYCODONE-ACETAMINOPHEN 5-325 MG PO TABS: 2.0000 | ORAL_TABLET | Freq: Four times a day (QID) | ORAL | Status: DC | PRN

## 2012-03-17 MED ORDER — DIPHENOXYLATE-ATROPINE 2.5-0.025 MG PO TABS: 2.0000 | ORAL_TABLET | Freq: Four times a day (QID) | ORAL | Status: AC | PRN

## 2012-03-17 NOTE — Progress Notes (Signed)
OFFICE PROGRESS NOTE  Interval history:  Mr. Carrico returns as scheduled. He continues to have abdominal pain and diarrhea. He describes the pain as "sharp and crampy". He notes hyperactive bowel sounds. He is having 10-15 loose stools per day. He notes increased abdominal pain after eating. He denies bleeding. He is taking Percocet 4-5 times per day. He is taking Lomotil and Imodium for the diarrhea.   Objective: Blood pressure 129/81, pulse 77, temperature 98.2 F (36.8 C), temperature source Oral, height 5' 11"  (1.803 m), weight 145 lb 9.6 oz (66.044 kg).  Oropharynx is without thrush or ulceration. No palpable cervical or supraclavicular lymph nodes. Lungs are clear. Regular cardiac rhythm. Abdomen is soft. Bowel sounds hyperactive. Abdomen with diffuse mild tenderness. Soft reducible left inguinal hernia. Extremities without edema.  Lab Results: Lab Results  Component Value Date   WBC 7.3 03/17/2012   HGB 13.9 03/17/2012   HCT 40.7 03/17/2012   MCV 104.3* 03/17/2012   PLT 139* 03/17/2012    Chemistry:    Chemistry      Component Value Date/Time   NA 136 03/17/2012 0843   K 3.7 03/17/2012 0843   CL 102 03/17/2012 0843   CO2 26 03/17/2012 0843   BUN 8 03/17/2012 0843   CREATININE 0.83 03/17/2012 0843      Component Value Date/Time   CALCIUM 9.0 03/17/2012 0843   ALKPHOS 80 12/15/2011 0953   AST 21 12/15/2011 0953   ALT 19 12/15/2011 0953   BILITOT 0.6 12/15/2011 0953     Stool negative for C. difficile on 03/05/2012.  Studies/Results: No results found.  Medications: I have reviewed the patient's current medications.  Assessment/Plan:  1. 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 He began radiation 11/19/2011. He completed cycle 1 5-fluorouracil/mitomycin C. beginning 11/17/2011 and cycle 2 12/15/2011. He completed radiation on 01/08/2012. 2. Insomnia. He continues Valium at  bedtime. 3. Tobacco use. 4. Degenerative disc disease. 5. History of mild thrombocytopenia secondary to chemotherapy. 6. Skin breakdown at the groin and perineum secondary to radiation/chemotherapy. Resolved. 7. Left inguinal hernia. 8. Abdominal pain and diarrhea of unclear etiology. Stool negative for C. difficile 03/05/2012.  Disposition-Martin [name] continues to experience abdominal pain and diarrhea. He is requiring narcotic analgesics for control of the pain. Dr. Benay Spice recommends a referral to [facility] gastroenterology. He was given a new Percocet prescription today. He is scheduled to return for a followup visit on 03/26/2012. He will contact the office in the interim with any problems.  Patient seen with Dr. Benay Spice.  Ned Card ANP/GNP-BC

## 2012-03-18 ENCOUNTER — Encounter: Payer: Self-pay | Admitting: Internal Medicine

## 2012-03-19 ENCOUNTER — Telehealth: Payer: Self-pay | Admitting: *Deleted

## 2012-03-19 ENCOUNTER — Encounter: Payer: Self-pay | Admitting: Internal Medicine

## 2012-03-19 ENCOUNTER — Ambulatory Visit (INDEPENDENT_AMBULATORY_CARE_PROVIDER_SITE_OTHER): Payer: Self-pay | Admitting: Internal Medicine

## 2012-03-19 ENCOUNTER — Other Ambulatory Visit (INDEPENDENT_AMBULATORY_CARE_PROVIDER_SITE_OTHER): Payer: Self-pay

## 2012-03-19 VITALS — BP 100/58 | HR 76 | Ht 71.0 in | Wt 148.0 lb

## 2012-03-19 DIAGNOSIS — R1110 Vomiting, unspecified: Secondary | ICD-10-CM

## 2012-03-19 DIAGNOSIS — Z85048 Personal history of other malignant neoplasm of rectum, rectosigmoid junction, and anus: Secondary | ICD-10-CM

## 2012-03-19 DIAGNOSIS — R110 Nausea: Secondary | ICD-10-CM

## 2012-03-19 DIAGNOSIS — E538 Deficiency of other specified B group vitamins: Secondary | ICD-10-CM | POA: Insufficient documentation

## 2012-03-19 DIAGNOSIS — R197 Diarrhea, unspecified: Secondary | ICD-10-CM

## 2012-03-19 DIAGNOSIS — R1013 Epigastric pain: Secondary | ICD-10-CM

## 2012-03-19 DIAGNOSIS — R109 Unspecified abdominal pain: Secondary | ICD-10-CM

## 2012-03-19 LAB — URINALYSIS WITH CULTURE, IF INDICATED
Bilirubin Urine: NEGATIVE
Hgb urine dipstick: NEGATIVE
Nitrite: NEGATIVE
Total Protein, Urine: NEGATIVE
Urine Glucose: NEGATIVE
Urobilinogen, UA: 0.2 (ref 0.0–1.0)

## 2012-03-19 MED ORDER — PANTOPRAZOLE SODIUM 40 MG PO TBEC
40.0000 mg | DELAYED_RELEASE_TABLET | Freq: Every day | ORAL | Status: DC
Start: 2012-03-19 — End: 2013-01-03

## 2012-03-19 MED ORDER — PROMETHAZINE HCL 12.5 MG PO TABS: 12.5000 mg | ORAL_TABLET | Freq: Four times a day (QID) | ORAL | Status: DC | PRN

## 2012-03-19 MED ORDER — HYOSCYAMINE SULFATE 0.125 MG SL SUBL: 0.1250 mg | SUBLINGUAL_TABLET | SUBLINGUAL | Status: DC | PRN

## 2012-03-19 MED ORDER — PEG-KCL-NACL-NASULF-NA ASC-C 100 G PO SOLR: 1.0000 | Freq: Once | ORAL | Status: DC

## 2012-03-19 NOTE — Progress Notes (Signed)
Patient ID: Martin [name], male   DOB: Jun 30, 1961, 51 y.o.   MRN: [id number]  SUBJECTIVE: HPI Martin [name] is a 51 yo male with PMH of anal cancer status post transanal excision, chemotherapy with radiation, hypertension, GERD, degenerative joint disease who seen in consultation at the request of Dr. Benay Spice for evaluation of abdominal pain and diarrhea. The patient is alone today. He was diagnosed with anal cancer in January 2013 after developing severe anal pain and bleeding. He underwent transanal excision in February 2013 and began radiation March 2013. He completed chemotherapy in April 2013 and radiation may 2013. As of his last PET scan he has no active disease. He reports ongoing trouble with upper and mid abdominal pain. His abdominal pain is a daily occurrence, but is worse after eating. He describes this as a sharp and at times cramping type pain. He feels that this pain started after chemotherapy and radiation. It has seemed to worsen. He has an appetite he reports he "wants to eat", but he is hesitant given the pain that he knows he might experience. He does report nausea sometimes worse after eating, but other times occurring at random. This is occasionally associated with vomiting. He has tried Compazine for this nausea with little relief. He also tried Pepto-Bismol without much relief. He does report some heartburn but this is not severe. No dysphagia. He reports ongoing diarrhea over the last 6 weeks. This is occurring 10-20 times daily, and frequently wakes him from sleep. He reports his stools are often very urgent and voluminous. He denies melena. He has not seen rectal bleeding recently.  No fevers that he is aware of. He has used Lomotil and loperamide with some success, though nothing he is found completely eliminates his diarrhea.overall he has lost about 25 pounds since his surgery. He does report some urinary hesitancy and trouble starting his urinary stream. When he is able to start he  is often troubled with hesitancy.  Review of Systems  As per history of present illness, otherwise negative   Past Medical History  Diagnosis Date  . Inguinal hernia   . GERD (gastroesophageal reflux disease)   . Hypertension     EKG 12/12 EPIC   no  PCP- states increased lately but hasnt been diagnosed formally  . Pneumonia     as child, cough at present with no fever  . Hemorrhoid     internal  . 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 Allergy 2 2013  . Anxiety   . Arthritis     DDD lumbar, arthritis knees  . DJD (degenerative joint disease) of lumbar spine     Current Outpatient Prescriptions  Medication Sig Dispense Refill  . diazepam (VALIUM) 5 MG tablet TAKE 1 TABLET BY MOUTH AT BEDTIME AS NEEDED  30 tablet  0  . diphenoxylate-atropine (LOMOTIL) 2.5-0.025 MG per tablet Take 2 tablets by mouth 4 (four) times daily as needed for diarrhea or loose stools.  30 tablet  1  . emollient (BIAFINE) cream Apply 170 g topically 2 (two) times daily as needed.      . loperamide (IMODIUM) 2 MG capsule TAKE TWO CAPSULES FOUR TIMES DAILY AS NEEDED FOR DIARRHEA OR LOOSE STOOLS.  60 capsule  0  . oxyCODONE-acetaminophen (PERCOCET) 5-325 MG per tablet Take 2 tablets by mouth every 6 (six) hours as needed for pain.  60 tablet  0  . prochlorperazine (COMPAZINE) 10 MG tablet  TAKE 1 TABLET BY MOUTH EVERY 6 HOURS AS NEEDED  60 tablet  0  . psyllium (METAMUCIL) 0.52 G capsule Take 0.52 g by mouth 2 (two) times daily.        Allergies  Allergen Reactions  . Aspirin Swelling  . Codeine Nausea Only  . Morphine And Related Itching    Family History  Problem Relation Age of Onset  . Colon cancer Father     History  Substance Use Topics  . Smoking status: Current Everyday Smoker -- 0.5 packs/day for 41 years    Types: Cigarettes  . Smokeless tobacco: Never Used  . Alcohol Use: Yes     weekend    6pk weekend, beer , less at present time 3/17    OBJECTIVE: BP  100/58  Pulse 76  Ht 5' 11"  (1.803 m)  Wt 148 lb (67.132 kg)  BMI 20.64 kg/m2 Constitutional: Well-developed and well-nourished. No distress. HEENT: Normocephalic and atraumatic. Oropharynx is clear and moist. No oropharyngeal exudate. Conjunctivae are normal. No scleral icterus. Neck: Neck supple. Trachea midline. Cardiovascular: Normal rate, regular rhythm and intact distal pulses. No M/R/G Pulmonary/chest: Effort normal and breath sounds normal. No wheezing, rales or rhonchi. Abdominal: Soft,  mild epigastric tenderness without rebound or guarding, nondistended. Bowel sounds active throughout. No hepatosplenomegaly. Extremities: no clubbing, cyanosis, or edema Lymphadenopathy: No cervical adenopathy noted. Neurological: Alert and oriented to person place and time. Skin: Skin is warm and dry. No rashes noted.Multiple tattoos noted  Psychiatric: Normal mood and affect. Behavior is normal.  Labs and Imaging -- CBC    Component Value Date/Time   WBC 7.3 03/17/2012 0843   WBC 11.7* 10/13/2011 1410   RBC 3.90* 03/17/2012 0843   RBC 4.77 10/13/2011 1410   HGB 13.9 03/17/2012 0843   HGB 15.2 10/13/2011 1410   HCT 40.7 03/17/2012 0843   HCT 43.6 10/13/2011 1410   PLT 139* 03/17/2012 0843   PLT 186 10/13/2011 1410   MCV 104.3* 03/17/2012 0843   MCV 91.4 10/13/2011 1410   MCH 35.7* 03/17/2012 0843   MCH 31.9 10/13/2011 1410   MCHC 34.3 03/17/2012 0843   MCHC 34.9 10/13/2011 1410   RDW 13.4 03/17/2012 0843   RDW 12.8 10/13/2011 1410   LYMPHSABS 0.7* 03/17/2012 0843   LYMPHSABS 3.4 08/28/2011 1045   MONOABS 0.5 03/17/2012 0843   MONOABS 0.9 08/28/2011 1045   EOSABS 0.3 03/17/2012 0843   EOSABS 0.3 08/28/2011 1045   BASOSABS 0.0 03/17/2012 0843   BASOSABS 0.1 08/28/2011 1045    CMP     Component Value Date/Time   NA 136 03/17/2012 0843   K 3.7 03/17/2012 0843   CL 102 03/17/2012 0843   CO2 26 03/17/2012 0843   GLUCOSE 92 03/17/2012 0843   BUN 8 03/17/2012 0843   CREATININE 0.83 03/17/2012 0843   CALCIUM  9.0 03/17/2012 0843   PROT 7.0 12/15/2011 0953   ALBUMIN 4.0 12/15/2011 0953   AST 21 12/15/2011 0953   ALT 19 12/15/2011 0953   ALKPHOS 80 12/15/2011 0953   BILITOT 0.6 12/15/2011 0953   GFRNONAA >90 10/13/2011 1410   GFRAA >90 10/13/2011 1410   PET scan 10/22/2011 NUCLEAR MEDICINE PET CT SKULL BASE TO THIGH   Technique:  18.8 mCi F-18 FDG was injected intravenously via the right AC.  Full-ring PET imaging was performed from the skull base through the mid-thighs 75  minutes after injection.  CT data was obtained and used for attenuation correction and anatomic localization only.  (  This was not acquired as a diagnostic CT examination.)   Fasting Blood Glucose:  85   Patient Weight:  166 pounds.   Comparison: Plain film chest of 10/13/2011.  Abdominal pelvic CTs of 08/28/2011.   Findings: PET images demonstrate no abnormal activity within the neck, chest, or abdomen.  2 foci of right pelvic hypermetabolism are due to ureteric excretion and are without CT correlate.   Within the 7 o'clock position of the rectum, an area of mild hypermetabolism corresponds to  probable retained stool.  This measures a S.U.V. max of 2.5, including on image 242.   Low anal hypermetabolism is symmetric and favored to be physiologic.  This measures a S.U.V. max of 3.5, including on image 268. No CT correlate.   CT images performed for attenuation correction demonstrate no significant findings within the neck.   LAD coronary artery atherosclerosis, including on images 101 - 102. Biapical pleural thickening.  A 3 mm posterior lingular nodule on image 101.   A posterior gastric diverticulum.  No perirectal or perianal adenopathy.  The ischio-rectal and ischio-anal fossae are within normal limits.   IMPRESSION:   1.  No hypermetabolic primary or metastatic disease identified. Likely physiologic anal and posterior rectal hypermetabolism, as detailed above. 2.  A tiny lingular nodule is nonspecific and  warrants followup attention. 3. Age advanced coronary artery atherosclerosis.  Recommend assessment of coronary risk factors and consideration of medical therapy.   C. difficile negative 03/05/2012   ASSESSMENT AND PLAN: 51 yo male with PMH of anal cancer status post transanal excision, chemotherapy with radiation, hypertension, GERD, degenerative joint disease who seen in consultation at the request of Dr. Benay Spice for evaluation of abdominal pain and diarrhea.  1. Abd pain/diarrhea/nausea/vomiting -- the differential diagnosis of the patient's constellation is a GI symptoms is broad. I would like to perform further stool studies to include bacterial stool culture, ova and parasite exam, fecal leukocytes. He has already tested negative for C. difficile. His abdominal pain tends to be worse postprandially raising the possibility of gastritis or peptic ulcer disease. I will start him on pantoprazole 40 mg daily for acid suppression. I also recommended an upper endoscopy to visualize his upper GI tract. I will discontinue Compazine as it is not working, and start him on Phenergan 25 mg every 6 hours as needed for nausea. I also would like to repeat a CT scan of his abdomen and pelvis to better evaluate his pain. Radiation enteritis is in the differential given his recent radiation treatment, and could explain symptoms of abdominal pain and diarrhea. I also feel that if the workup is negative and imaging is unrevealing, that should proceed with colonoscopy to evaluate his colon. He has never had colonoscopy, and I'll plan random biopsies to rule out microscopic colitis. He can continue the loperamide and Lomotil as directed and as needed for diarrhea. I will also prescribe Levsin to be used as directed and as needed for abdominal pain/spasm. He's also using Percocet for abdominal pain.   2. Urinary complaints  -- I'll start by performing urinalysis with urine culture. This is unrevealing he may need  referral to urology for evaluation of his prostate.

## 2012-03-19 NOTE — Telephone Encounter (Signed)
Left VM this am requesting another antiemetic-current Compazine not effective. Reports "I've been throwing up pretty much". Noted that he has an appointment today at 1:30pm with Dr. Hilarie Fredrickson at [facility].

## 2012-03-19 NOTE — Patient Instructions (Addendum)
You have been given a separate informational sheet regarding your tobacco use, the importance of quitting and local resources to help you quit.  You have been scheduled for a colonoscopy with propofol. Please follow written instructions given to you at your visit today.  Please pick up your prep kit at the pharmacy within the next 1-3 days. If you use inhalers (even only as needed), please bring them with you on the day of your procedure.  Your physician has requested that you go to the basement for lab work before leaving today.  You have been scheduled for a CT scan of the abdomen and pelvis at [facility] ([address]---this is in the same building as Press [facility]).   You are scheduled on 03/23/2012 at 2:00pm. You should arrive 15 minutes prior to your appointment time for registration. Please follow the written instructions below on the day of your exam:  WARNING: IF YOU ARE ALLERGIC TO IODINE/X-RAY DYE, PLEASE NOTIFY RADIOLOGY IMMEDIATELY AT [phone]! YOU WILL BE GIVEN A 13 HOUR PREMEDICATION PREP.  1) Do not eat or drink anything after 10:00am (4 hours prior to your test) 2) You have been given 2 bottles of oral contrast to drink. The solution may taste better if refrigerated, but do NOT add ice or any other liquid to this solution. Shake well before drinking.    Drink 1 bottle of contrast @ 12:00 (2 hours prior to your exam)  Drink 1 bottle of contrast @ 1:00pm (1 hour prior to your exam)  You may take any medications as prescribed with a small amount of water except for the following: Metformin, Glucophage, Glucovance, Avandamet, Riomet, Fortamet, Actoplus Met, Janumet, Glumetza or Metaglip. The above medications must be held the day of the exam AND 48 hours after the exam.  The purpose of you drinking the oral contrast is to aid in the visualization of your intestinal tract. The contrast solution may cause some diarrhea. Before your exam is started, you will  be given a small amount of fluid to drink. Depending on your individual set of symptoms, you may also receive an intravenous injection of x-ray contrast/dye. Plan on being at [facility] for 30 minutes or long, depending on the type of exam you are having performed.  If you have any questions regarding your exam or if you need to reschedule, you may call the CT department at [phone] between the hours of 8:00 am and 5:00 pm, Monday-Friday.  ________________________________________________________________________

## 2012-03-23 ENCOUNTER — Inpatient Hospital Stay: Admission: RE | Admit: 2012-03-23 | Payer: Self-pay | Source: Ambulatory Visit

## 2012-03-23 ENCOUNTER — Other Ambulatory Visit (INDEPENDENT_AMBULATORY_CARE_PROVIDER_SITE_OTHER): Payer: Self-pay

## 2012-03-23 ENCOUNTER — Other Ambulatory Visit: Payer: Self-pay | Admitting: *Deleted

## 2012-03-23 ENCOUNTER — Telehealth: Payer: Self-pay | Admitting: *Deleted

## 2012-03-23 ENCOUNTER — Ambulatory Visit (INDEPENDENT_AMBULATORY_CARE_PROVIDER_SITE_OTHER)
Admission: RE | Admit: 2012-03-23 | Discharge: 2012-03-23 | Disposition: A | Discharge status: Home / Self Care | Payer: Self-pay | Source: Ambulatory Visit | Attending: Internal Medicine | Admitting: Internal Medicine

## 2012-03-23 DIAGNOSIS — R109 Unspecified abdominal pain: Secondary | ICD-10-CM

## 2012-03-23 DIAGNOSIS — R197 Diarrhea, unspecified: Secondary | ICD-10-CM

## 2012-03-23 DIAGNOSIS — R1110 Vomiting, unspecified: Secondary | ICD-10-CM

## 2012-03-23 DIAGNOSIS — Z85048 Personal history of other malignant neoplasm of rectum, rectosigmoid junction, and anus: Secondary | ICD-10-CM

## 2012-03-23 DIAGNOSIS — R110 Nausea: Secondary | ICD-10-CM

## 2012-03-23 LAB — AMYLASE: Amylase: 59 U/L (ref 27–131)

## 2012-03-23 LAB — LIPASE: Lipase: 29 U/L (ref 11.0–59.0)

## 2012-03-23 MED ORDER — OXYCODONE-ACETAMINOPHEN 5-325 MG PO TABS: ORAL_TABLET | ORAL | Status: DC

## 2012-03-23 MED ORDER — VITAMIN B-12 1000 MCG PO TABS: 1000.0000 ug | ORAL_TABLET | Freq: Every day | ORAL | Status: DC

## 2012-03-23 MED ORDER — FOLIC ACID 1 MG PO TABS: 1.0000 mg | ORAL_TABLET | Freq: Every day | ORAL | Status: DC

## 2012-03-23 MED ORDER — IOHEXOL 300 MG/ML  SOLN
100.0000 mL | Freq: Once | INTRAMUSCULAR | Status: AC | PRN
  Administered 2012-03-23: 100 mL via INTRAVENOUS

## 2012-03-23 NOTE — Telephone Encounter (Signed)
Dr Hilarie Fredrickson called back and Amylase and Lipase were not ordered- reordered. Also, he ok'd 60 tabs of Percocet; Tye Savoy, NP signed d/t Dr Hilarie Fredrickson at the hospital. lmom for pt to call back for script and lab appt.

## 2012-03-23 NOTE — Telephone Encounter (Signed)
Informed pt of need for more labs and he will have to pick up his script for percocet here; pt stated understanding.

## 2012-03-23 NOTE — Telephone Encounter (Signed)
Message copied by Lance Morin on Tue Mar 23, 2012  3:05 PM ------      Message from: Jerene Bears      Created: Fri Mar 19, 2012  3:52 PM       Urinalysis looks okay no indication of infection. He 12 and folate are both borderline low. I would like for him to take folate 1 g daily and B12 1000 mcg daily. He can be given prescriptions for both of these.      We will proceed with workup as discussed today, await stool studies, and proceed with procedures

## 2012-03-24 ENCOUNTER — Other Ambulatory Visit: Payer: Self-pay

## 2012-03-24 DIAGNOSIS — Z85048 Personal history of other malignant neoplasm of rectum, rectosigmoid junction, and anus: Secondary | ICD-10-CM

## 2012-03-24 DIAGNOSIS — R110 Nausea: Secondary | ICD-10-CM

## 2012-03-24 DIAGNOSIS — C210 Malignant neoplasm of anus, unspecified: Secondary | ICD-10-CM

## 2012-03-24 DIAGNOSIS — R1110 Vomiting, unspecified: Secondary | ICD-10-CM

## 2012-03-24 DIAGNOSIS — R197 Diarrhea, unspecified: Secondary | ICD-10-CM

## 2012-03-25 ENCOUNTER — Encounter: Payer: Self-pay | Admitting: Family Medicine

## 2012-03-25 ENCOUNTER — Ambulatory Visit (INDEPENDENT_AMBULATORY_CARE_PROVIDER_SITE_OTHER): Payer: Self-pay | Admitting: Family Medicine

## 2012-03-25 VITALS — BP 123/81 | HR 76 | Ht 71.0 in | Wt 145.4 lb

## 2012-03-25 DIAGNOSIS — K4090 Unilateral inguinal hernia, without obstruction or gangrene, not specified as recurrent: Secondary | ICD-10-CM

## 2012-03-25 NOTE — Patient Instructions (Addendum)
It was nice to meet you today.  I think you need to see the surgeon to discuss a surgery for your hernia. When you get the [place], call the surgeons to schedule your surgery. In the mean time, continue to see your oncologist to follow up on your cancer.  If you have any concerns or other medical problems, you can always make an appointment to be seen here.

## 2012-03-25 NOTE — Progress Notes (Signed)
Patient ID: Martin [name], male   DOB: Mar 29, 1961, 51 y.o.   MRN: [id number]  HPI: 51 yo male with hx of inguinal hernia, anal cancer, and hypertension who is here to establish care in an effort to get an [employer] for financial assistance with his hernia repair surgery. He previously saw general surgery for the hernia, and was going to schedule surgery for it, but financial problems kept this from happening.   The hernia is painful and more prominent when he coughs. He is able to reduce the hernia with some effort. It goes away when he lays down. He has had recent diarrhea, n/v, and weight loss and has just completed chemotherapy and radiation for anal cancer. Is being followed by oncology and GI for these symptoms. Has a colonoscopy scheduled for August 20, and a f/u oncology appt tomorrow.  Has reported hx of hypertension, never on meds.  PMH reviewed with pt and documented in chart.  Exam: Gen: NAD, pleasant, cooperative Heart: RRR, no murmurs Lungs: CTAB Abd: mildly tender to palpation in mid-abdomen, no masses or guarding GU: inguinal bulge that is nontender to palpation, no signs of incarceration    Assessment & Plan:  Hypertension - pt reports hx of this, but has never been on meds. BP today 123/81. Will continue to follow. No indication for treatment at this time.

## 2012-03-25 NOTE — Assessment & Plan Note (Signed)
Pt seen in family medicine clinic today as part of effort to obtain [employer] [place] card. Hernia is present on exam, but shows no signs of incarceration or strangulation. Pt previously saw Dr. Harlow Asa of general surgery who was prepared to schedule surgery, but pt had financial concerns that prevented it from being scheduled. Hopefully he will be able to obtain his [employer] and will be able to get financial assistance with the surgery. I have instructed him to call and schedule the surgery as soon as he obtains financial assistance. In the meantime, I will be happy to see him back in the family medicine clinic for any other concerns and for his general primary care.

## 2012-03-26 ENCOUNTER — Ambulatory Visit: Payer: Self-pay | Admitting: Oncology

## 2012-03-26 ENCOUNTER — Telehealth: Payer: Self-pay | Admitting: Oncology

## 2012-03-26 LAB — OVA AND PARASITE SCREEN

## 2012-03-26 NOTE — Telephone Encounter (Signed)
s/w pts wife and moved his appt from 8/8 to 8/13   aom

## 2012-03-28 LAB — STOOL CULTURE

## 2012-03-30 ENCOUNTER — Other Ambulatory Visit: Payer: Self-pay | Admitting: Internal Medicine

## 2012-03-30 ENCOUNTER — Other Ambulatory Visit: Payer: Self-pay | Admitting: *Deleted

## 2012-03-30 ENCOUNTER — Telehealth: Payer: Self-pay | Admitting: Internal Medicine

## 2012-03-30 DIAGNOSIS — R109 Unspecified abdominal pain: Secondary | ICD-10-CM

## 2012-03-30 MED ORDER — OXYCODONE-ACETAMINOPHEN 5-325 MG PO TABS: ORAL_TABLET | ORAL | Status: DC

## 2012-03-30 NOTE — Telephone Encounter (Signed)
Informed pt of normal labs so far and that I left him a script for percocet at the front desk.

## 2012-03-30 NOTE — Telephone Encounter (Signed)
Message from pt requesting refill on Percocet. Returned call to pt, he reports Dr. Hilarie Fredrickson refilled this already.

## 2012-04-01 ENCOUNTER — Ambulatory Visit: Payer: Self-pay | Admitting: Oncology

## 2012-04-01 ENCOUNTER — Other Ambulatory Visit: Payer: Self-pay | Admitting: Oncology

## 2012-04-01 DIAGNOSIS — C210 Malignant neoplasm of anus, unspecified: Secondary | ICD-10-CM

## 2012-04-02 ENCOUNTER — Ambulatory Visit: Payer: Self-pay | Admitting: Oncology

## 2012-04-02 ENCOUNTER — Telehealth: Payer: Self-pay | Admitting: Oncology

## 2012-04-02 NOTE — Telephone Encounter (Signed)
PT came today , [facility] this morning appt was r/s to 8/5, pt aware of appt

## 2012-04-12 ENCOUNTER — Telehealth: Payer: Self-pay | Admitting: Internal Medicine

## 2012-04-12 ENCOUNTER — Other Ambulatory Visit: Payer: Self-pay | Admitting: Gastroenterology

## 2012-04-12 ENCOUNTER — Ambulatory Visit: Payer: Self-pay | Admitting: Oncology

## 2012-04-12 DIAGNOSIS — C210 Malignant neoplasm of anus, unspecified: Secondary | ICD-10-CM

## 2012-04-13 MED ORDER — LOPERAMIDE HCL 2 MG PO CAPS: ORAL_CAPSULE | ORAL | Status: DC

## 2012-04-13 MED ORDER — HYOSCYAMINE SULFATE 0.125 MG SL SUBL: 0.1250 mg | SUBLINGUAL_TABLET | SUBLINGUAL | Status: DC | PRN

## 2012-04-13 NOTE — Telephone Encounter (Signed)
Sent refills for imodium and Levsin to pt's pharmacy. Pt is asking for his pain med. Dr. Hilarie Fredrickson is out of the office all week, I told him I will see if the doc of the day will refill it for him, but it wont be today. I will call him in the morning to let him know. Pt stated understanding.

## 2012-04-14 ENCOUNTER — Other Ambulatory Visit: Payer: Self-pay | Admitting: Gastroenterology

## 2012-04-14 ENCOUNTER — Telehealth: Payer: Self-pay | Admitting: Internal Medicine

## 2012-04-14 DIAGNOSIS — R109 Unspecified abdominal pain: Secondary | ICD-10-CM

## 2012-04-14 MED ORDER — OXYCODONE-ACETAMINOPHEN 5-325 MG PO TABS: ORAL_TABLET | ORAL | Status: DC

## 2012-04-14 NOTE — Telephone Encounter (Signed)
Waiting to see if Dr. Deatra Ina will authorize a refill on pt's Percocet 5-325, since Dr. Hilarie Fredrickson is out of the office this week.

## 2012-04-14 NOTE — Telephone Encounter (Signed)
Patient is following up to see if you got an ok from another doctor for his pain medication

## 2012-04-15 ENCOUNTER — Telehealth: Payer: Self-pay | Admitting: Gastroenterology

## 2012-04-15 ENCOUNTER — Other Ambulatory Visit: Payer: Self-pay | Admitting: Gastroenterology

## 2012-04-15 NOTE — Telephone Encounter (Signed)
Walked Rx for [employer] over to the pharmacy for pt. Pharmacy to call pt to let him know when ready.

## 2012-04-15 NOTE — Telephone Encounter (Signed)
Called to let him know Dr. Deatra Ina signed his Rx for Percocet. He asked what time do we close and I told him now, he said he wont be able to come pick up his Rx until tomorrow.

## 2012-04-16 ENCOUNTER — Other Ambulatory Visit: Payer: Self-pay | Admitting: *Deleted

## 2012-04-19 ENCOUNTER — Telehealth: Payer: Self-pay | Admitting: Oncology

## 2012-04-19 NOTE — Telephone Encounter (Signed)
lmonvm adviisng the pt of his sept appt. Pt missed his last appt with [place]

## 2012-04-21 ENCOUNTER — Emergency Department (HOSPITAL_COMMUNITY)
Admission: EM | Admit: 2012-04-21 | Discharge: 2012-04-21 | Disposition: A | Discharge status: Home / Self Care | Payer: Self-pay | Attending: Emergency Medicine | Admitting: Emergency Medicine

## 2012-04-21 ENCOUNTER — Encounter (HOSPITAL_COMMUNITY): Payer: Self-pay

## 2012-04-21 ENCOUNTER — Emergency Department (HOSPITAL_COMMUNITY): Payer: Self-pay

## 2012-04-21 DIAGNOSIS — K4090 Unilateral inguinal hernia, without obstruction or gangrene, not specified as recurrent: Secondary | ICD-10-CM

## 2012-04-21 DIAGNOSIS — R109 Unspecified abdominal pain: Secondary | ICD-10-CM

## 2012-04-21 DIAGNOSIS — R197 Diarrhea, unspecified: Secondary | ICD-10-CM

## 2012-04-21 LAB — CBC WITH DIFFERENTIAL/PLATELET
Basophils Relative: 1 % (ref 0–1)
HCT: 40.1 % (ref 39.0–52.0)
Hemoglobin: 14.1 g/dL (ref 13.0–17.0)
Lymphs Abs: 1.2 10*3/uL (ref 0.7–4.0)
MCHC: 35.2 g/dL (ref 30.0–36.0)
Monocytes Absolute: 0.5 10*3/uL (ref 0.1–1.0)
Monocytes Relative: 6 % (ref 3–12)
Neutro Abs: 6.4 10*3/uL (ref 1.7–7.7)
RBC: 4.1 MIL/uL — ABNORMAL LOW (ref 4.22–5.81)

## 2012-04-21 LAB — URINALYSIS, ROUTINE W REFLEX MICROSCOPIC
Glucose, UA: NEGATIVE mg/dL
Ketones, ur: NEGATIVE mg/dL
Leukocytes, UA: NEGATIVE
Nitrite: NEGATIVE
Specific Gravity, Urine: 1.013 (ref 1.005–1.030)
pH: 6 (ref 5.0–8.0)

## 2012-04-21 LAB — COMPREHENSIVE METABOLIC PANEL
Albumin: 3.6 g/dL (ref 3.5–5.2)
Alkaline Phosphatase: 67 U/L (ref 39–117)
BUN: 12 mg/dL (ref 6–23)
CO2: 23 mEq/L (ref 19–32)
Chloride: 106 mEq/L (ref 96–112)
Creatinine, Ser: 0.69 mg/dL (ref 0.50–1.35)
GFR calc Af Amer: >90 mL/min (ref >90)
GFR calc non Af Amer: >90 mL/min (ref >90)
Glucose, Bld: 91 mg/dL (ref 70–99)
Potassium: 4.4 mEq/L (ref 3.5–5.1)
Total Bilirubin: 0.2 mg/dL — ABNORMAL LOW (ref 0.3–1.2)

## 2012-04-21 MED ORDER — IOHEXOL 300 MG/ML  SOLN
20.0000 mL | INTRAMUSCULAR | Status: AC
  Administered 2012-04-21: 20 mL via ORAL

## 2012-04-21 MED ORDER — OXYCODONE-ACETAMINOPHEN 5-325 MG PO TABS: 1.0000 | ORAL_TABLET | ORAL | Status: DC | PRN

## 2012-04-21 MED ORDER — ONDANSETRON HCL 4 MG/2ML IJ SOLN
4.0000 mg | Freq: Once | INTRAMUSCULAR | Status: AC
  Administered 2012-04-21: 4 mg via INTRAVENOUS
  Filled 2012-04-21: qty 2

## 2012-04-21 MED ORDER — IOHEXOL 300 MG/ML  SOLN
100.0000 mL | Freq: Once | INTRAMUSCULAR | Status: AC | PRN
  Administered 2012-04-21: 100 mL via INTRAVENOUS

## 2012-04-21 MED ORDER — HYOSCYAMINE SULFATE 0.125 MG SL SUBL: 0.1250 mg | SUBLINGUAL_TABLET | SUBLINGUAL | Status: DC | PRN

## 2012-04-21 MED ORDER — HYDROMORPHONE HCL PF 1 MG/ML IJ SOLN
1.0000 mg | Freq: Once | INTRAMUSCULAR | Status: AC
  Administered 2012-04-21: 1 mg via INTRAVENOUS
  Filled 2012-04-21: qty 1

## 2012-04-21 MED ORDER — HYDROMORPHONE HCL PF 1 MG/ML IJ SOLN
INTRAMUSCULAR | Status: AC
  Administered 2012-04-21: 1 mg
  Filled 2012-04-21: qty 1

## 2012-04-21 NOTE — ED Notes (Signed)
Iv team paged.  Two failed attempts by RN Kandis Henry and on [place].

## 2012-04-21 NOTE — ED Provider Notes (Signed)
History     CSN: [id number]  Arrival date & time 04/21/12  1113   First MD Initiated Contact with Patient 04/21/12 1351      Chief Complaint  Patient presents with  . Inguinal Hernia    (Consider location/radiation/quality/duration/timing/severity/associated sxs/prior treatment) HPI Patient presenting with complaint of swollen inguinal hernia on the left. He has a history of inguinal hernia that has been present for some time but he states that over the past 3 days the swelling has been more persistent and painful than usual. He does report that with lying flat the swelling goes completely away. He states that with standing up or walking he does have swelling in his inguinal region. He's had no vomiting and no fevers. Bowel movements are normal but he does have to strain at times which causes increased swelling of his hernia.  He states he has seen surgery for this in the past and surgical repair has been recommended but not scheduled yet due to financial issues.  There are no other associated systemic symptoms, there are no other alleviating or modifying factors.   Past Medical History  Diagnosis Date  . Inguinal hernia   . GERD (gastroesophageal reflux disease)   . Hypertension     EKG 12/12 EPIC   no  PCP- states increased lately but hasnt been diagnosed formally  . Pneumonia     as child, cough at present with no fever  . Hemorrhoid     internal  . 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 Allergy 2 2013  . Anxiety   . Arthritis     DDD lumbar, arthritis knees  . DJD (degenerative joint disease) of lumbar spine     Past Surgical History  Procedure Date  . Transanal excision 10/14/2011    Dr.Todd Gerkin  . Surgical pathology  10/14/2011    squamous cell ca of anus  . Appendectomy     age 47 or 31  . Examination under anesthesia 10/14/2011    Procedure: EXAM UNDER ANESTHESIA;  Surgeon: Earnstine Regal, MD;  Location: [facility];  Service: General;   Laterality: N/A;  exam under anethesia, Excision of mass anal canal, 1.5cm    Family History  Problem Relation Age of Onset  . Colon cancer Father   . Asthma Father   . Cancer Father     prostate  . Hypertension Father   . Asthma Mother   . Hypertension Mother     History  Substance Use Topics  . Smoking status: Current Everyday Smoker -- 0.5 packs/day for 41 years    Types: Cigarettes  . Smokeless tobacco: Never Used  . Alcohol Use: Yes     weekend    6pk weekend, beer , less at present time 3/17      Review of Systems ROS reviewed and all otherwise negative except for mentioned in HPI  Allergies  Aspirin; Codeine; and Morphine and related  Home Medications   Current Outpatient Rx  Name Route Sig Dispense Refill  . DIAZEPAM 5 MG PO TABS  TAKE 1 TABLET BY MOUTH EVERY NIGHT AT BEDTIME AS NEEDED 30 tablet 0    CALLED TO PHARMACY  . EMOLLIENT BASE EX CREA Topical Apply 170 g topically 2 (two) times daily as needed. Radiation burns    . FOLIC ACID 1 MG PO TABS Oral Take 1 tablet (1 mg total) by mouth daily. 30 tablet 6  . HYOSCYAMINE  SULFATE 0.125 MG SL SUBL Oral Take 1 tablet (0.125 mg total) by mouth every 4 (four) hours as needed for cramping. 30 tablet 0  . LOPERAMIDE HCL 2 MG PO CAPS Oral Take 4 mg by mouth 4 (four) times daily as needed. For diarrhea or loose stools    . OXYCODONE-ACETAMINOPHEN 5-325 MG PO TABS  Take 1-2 tablets by mouth every 4-6 hours as needed for pain 60 tablet 0  . PANTOPRAZOLE SODIUM 40 MG PO TBEC Oral Take 1 tablet (40 mg total) by mouth daily. 90 tablet 3  . PSYLLIUM 0.52 G PO CAPS Oral Take 0.52 g by mouth 2 (two) times daily.    Marland Kitchen VITAMIN B-12 1000 MCG PO TABS Oral Take 1 tablet (1,000 mcg total) by mouth daily. 30 tablet 6  . HYOSCYAMINE SULFATE 0.125 MG SL SUBL Sublingual Place 1 tablet (0.125 mg total) under the tongue every 4 (four) hours as needed for cramping. 30 tablet 0  . OXYCODONE-ACETAMINOPHEN 5-325 MG PO TABS Oral Take 1 tablet by  mouth every 4 (four) hours as needed for pain. 20 tablet 0  . PROMETHAZINE HCL 12.5 MG PO TABS Oral Take 1 tablet (12.5 mg total) by mouth every 6 (six) hours as needed for nausea. 30 tablet 0    BP 130/75  Pulse 66  Temp 97.8 F (36.6 C) (Oral)  Resp 20  SpO2 99% Vitals reviewed Physical Exam Physical Examination: General appearance - alert, well appearing, and in no distress Mental status - alert, oriented to person, place, and time Eyes - no scleral icterus, no conjunctival injection Mouth - mucous membranes moist, pharynx normal without lesions Chest - clear to auscultation, no wheezes, rales or rhonchi, symmetric air entry Heart - normal rate, regular rhythm, normal S1, S2, no murmurs, rubs, clicks or gallops Abdomen - soft, nontender, nondistended, no masses or organomegaly, nabs GU Male - no penile lesions or discharge, no testicular masses or tenderness, no hernias palpable, no scrotal swelling, some ttp in left inguinal region Musculoskeletal - no joint tenderness, deformity or swelling Extremities - peripheral pulses normal, no pedal edema, no clubbing or cyanosis Skin - normal coloration and turgor, no rashes, brisk cap refill ED Course  Procedures (including critical care time)  3:16 PM  D/w Lahoma Rocker PA about patient moving to the CDU- he is awaiting lab results and CT scan.  Pt knows to stay NPO.  Labs Reviewed  CBC WITH DIFFERENTIAL - Abnormal; Notable for the following:    RBC 4.10 (*)     MCH 34.4 (*)     All other components within normal limits  COMPREHENSIVE METABOLIC PANEL - Abnormal; Notable for the following:    Total Bilirubin 0.2 (*)     All other components within normal limits  URINALYSIS, ROUTINE W REFLEX MICROSCOPIC  LAB REPORT - SCANNED   Ct Abdomen Pelvis W Contrast  04/21/2012  *RADIOLOGY REPORT*  Clinical Data: 51 year old male with lower abdominal pain. Inguinal hernia. Anal cancer status post chemotherapy and radiation.  CT ABDOMEN AND PELVIS  WITH CONTRAST  Technique:  Multidetector CT imaging of the abdomen and pelvis was performed following the standard protocol during bolus administration of intravenous contrast.  Contrast: 152m OMNIPAQUE IOHEXOL 300 MG/ML  SOLN  Comparison: 03/23/2012 and earlier.  Findings: Areas of gas trapping, emphysema, and scarring in the lower lobes are stable.  No pericardial or pleural effusion. Degenerative changes in the spine.  No suspicious osseous lesion identified.  Stable or mildly decreased presacral stranding.  No pelvic free fluid.  Stable appearance of the distal colon.  Stable stranding in the perineum.  Negative bladder.  No inguinal hernia identified.  Descending and more proximal colon segments appear stable and within normal limits.  Oral contrast has not yet reached the distal small bowel.  No appendix identified.  No pericecal inflammation. No dilated small bowel loops.  Stomach and duodenum are within normal limits.  Liver enhancement is within normal limits. Gallbladder, spleen, pancreas, adrenal glands, kidneys, portal venous system, and major arterial structures are within normal limits.  There is bilateral femoral artery atherosclerosis re- identified.  No abdominal free fluid.  No lymphadenopathy identified.  IMPRESSION: No acute or metastatic findings in the abdomen or pelvis. Stable probable therapy related to mild pelvic and perineal stranding. Chronic scarring and gas trapping at the lung bases.  Original Report Authenticated By: Joni Fears III, M.D.     1. Abdominal  pain, other specified site   2. Diarrhea   3. Inguinal hernia       MDM  Patient presenting with known left inguinal hernia with increased swelling and pain over the past several days. Labs and CT scan will be obtained today. Patient was moved to CDU for holding while awaiting these results. I have discussed with patient and family at the bedside that we are checking for incarcerated hernia today. Depending on CT scan  results he may or may not need surgical consultation in the ED today. Patient was treated with IV pain and nausea medications and he was instructed to remain n.p.o.        Threasa Beards, MD 04/23/12 [phone]

## 2012-04-21 NOTE — ED Notes (Signed)
Pt brought to room from triage; pt undressed and in gown; 3 blankets given to pt and 2 visitors

## 2012-04-21 NOTE — ED Provider Notes (Signed)
5:57 PM Pt seen and examined by me. Pt with left sided inguinal hernia for "months." has seen an surgeon and was told that he needs surgery but unable to pay. Pt reports increased pain. Pt also reports diarrhea on and off for a year. Pt in CDU pending CT abdomen pelvis  CT back negative for acute abnormality. Pt in NAD. VS normal. Abdomen soft, non tender. No hernia on exam in laying position. Pt stable for d/c with outpatient follow up for his hernia which appears to be currently reduced. Pt not vomiting, afebrile, no acute abdomen at present   Filed Vitals:   04/21/12 1536  BP: 130/75  Pulse: 66  Temp:   Resp: [place], PA 04/21/12 1759

## 2012-04-21 NOTE — ED Notes (Signed)
Pt reports (L) inguinal hernia pain x3 days, pt reports a hx of the same

## 2012-04-23 NOTE — ED Provider Notes (Signed)
Medical screening examination/treatment/procedure(s) were conducted as a shared visit with non-physician practitioner(s) and myself.  I personally evaluated the patient during the encounter  Pt seen primarily by me  Threasa Beards, MD 04/23/12 [phone]

## 2012-04-26 ENCOUNTER — Telehealth: Payer: Self-pay | Admitting: Gastroenterology

## 2012-04-26 ENCOUNTER — Encounter: Payer: Self-pay | Admitting: Family Medicine

## 2012-04-26 ENCOUNTER — Other Ambulatory Visit: Payer: Self-pay | Admitting: Internal Medicine

## 2012-04-26 ENCOUNTER — Other Ambulatory Visit: Payer: Self-pay | Admitting: Gastroenterology

## 2012-04-26 ENCOUNTER — Ambulatory Visit (INDEPENDENT_AMBULATORY_CARE_PROVIDER_SITE_OTHER): Payer: Self-pay | Admitting: Family Medicine

## 2012-04-26 VITALS — BP 138/81 | HR 88 | Temp 98.1°F | Ht 71.0 in | Wt 143.0 lb

## 2012-04-26 DIAGNOSIS — R109 Unspecified abdominal pain: Secondary | ICD-10-CM

## 2012-04-26 DIAGNOSIS — K4090 Unilateral inguinal hernia, without obstruction or gangrene, not specified as recurrent: Secondary | ICD-10-CM

## 2012-04-26 MED ORDER — OXYCODONE-ACETAMINOPHEN 5-325 MG PO TABS: ORAL_TABLET | ORAL | Status: DC

## 2012-04-26 MED ORDER — FOLIC ACID 1 MG PO TABS: 1.0000 mg | ORAL_TABLET | Freq: Every day | ORAL | Status: DC

## 2012-04-26 NOTE — Telephone Encounter (Signed)
Talked to pt. Told him folic acid was sent to his pharmacy. And he can come and pick up his Rx for Percocet. He said he will be in later today to get it.

## 2012-04-26 NOTE — Patient Instructions (Signed)
You have a hernia.  Currently it can be reduced, or pushed back in.  If you cannot push the hernia back in and it becomes extremely painful or you are unable to have bowel movements than you need emergency surgery Please go see the general surgery team on August 29th at 1:30 Have a great day.   Inguinal Hernia, Adult Muscles help keep everything in the body in its proper place. But if a weak spot in the muscles develops, something can poke through. That is called a hernia. When this happens in the lower part of the belly (abdomen), it is called an inguinal hernia. (It takes its name from a part of the body in this region called the inguinal canal.) A weak spot in the wall of muscles lets some fat or part of the small intestine bulge through. An inguinal hernia can develop at any age. Men get them more often than women. CAUSES  In adults, an inguinal hernia develops over time.  It can be triggered by:   Suddenly straining the muscles of the lower abdomen.   Lifting heavy objects.   Straining to have a bowel movement. Difficult bowel movements (constipation) can lead to this.   Constant coughing. This may be caused by smoking or lung disease.   Being overweight.   Being pregnant.   Working at a job that requires long periods of standing or heavy lifting.   Having had an inguinal hernia before.  One type can be an emergency situation. It is called a strangulated inguinal hernia. It develops if part of the small intestine slips through the weak spot and cannot get back into the abdomen. The blood supply can be cut off. If that happens, part of the intestine may die. This situation requires emergency surgery. SYMPTOMS  Often, a small inguinal hernia has no symptoms. It is found when a healthcare provider does a physical exam. Larger hernias usually have symptoms.   In adults, symptoms may include:   A lump in the groin. This is easier to see when the person is standing. It might  disappear when lying down.   In men, a lump in the scrotum.   Pain or burning in the groin. This occurs especially when lifting, straining or coughing.   A dull ache or feeling of pressure in the groin.   Signs of a strangulated hernia can include:   A bulge in the groin that becomes very painful and tender to the touch.   A bulge that turns red or purple.   Fever, nausea and vomiting.   Inability to have a bowel movement or to pass gas.  DIAGNOSIS  To decide if you have an inguinal hernia, a healthcare provider will probably do a physical examination.  This will include asking questions about any symptoms you have noticed.   The healthcare provider might feel the groin area and ask you to cough. If an inguinal hernia is felt, the healthcare provider may try to slide it back into the abdomen.   Usually no other tests are needed.  TREATMENT  Treatments can vary. The size of the hernia makes a difference. Options include:  Watchful waiting. This is often suggested if the hernia is small and you have had no symptoms.   No medical procedure will be done unless symptoms develop.   You will need to watch closely for symptoms. If any occur, contact your healthcare provider right away.   Surgery. This is used if the hernia is larger  or you have symptoms.   Open surgery. This is usually an outpatient procedure (you will not stay overnight in a hospital). An cut (incision) is made through the skin in the groin. The hernia is put back inside the abdomen. The weak area in the muscles is then repaired by herniorrhaphy or hernioplasty. Herniorrhaphy: in this type of surgery, the weak muscles are sewn back together. Hernioplasty: a patch or mesh is used to close the weak area in the abdominal wall.   Laparoscopy. In this procedure, a surgeon makes small incisions. A thin tube with a tiny video camera (called a laparoscope) is put into the abdomen. The surgeon repairs the hernia with mesh by  looking with the video camera and using two long instruments.  HOME CARE INSTRUCTIONS   After surgery to repair an inguinal hernia:   You will need to take pain medicine prescribed by your healthcare provider. Follow all directions carefully.   You will need to take care of the wound from the incision.   Your activity will be restricted for awhile. This will probably include no heavy lifting for several weeks. You also should not do anything too active for a few weeks. When you can return to work will depend on the type of job that you have.   During "watchful waiting" periods, you should:   Maintain a healthy weight.   Eat a diet high in fiber (fruits, vegetables and whole grains).   Drink plenty of fluids to avoid constipation. This means drinking enough water and other liquids to keep your urine clear or pale yellow.   Do not lift heavy objects.   Do not stand for long periods of time.   Quit smoking. This should keep you from developing a frequent cough.  SEEK MEDICAL CARE IF:   A bulge develops in your groin area.   You feel pain, a burning sensation or pressure in the groin. This might be worse if you are lifting or straining.   You develop a fever of more than 100.5 F (38.1 C).  SEEK IMMEDIATE MEDICAL CARE IF:   Pain in the groin increases suddenly.   A bulge in the groin gets bigger suddenly and does not go down.   For men, there is sudden pain in the scrotum. Or, the size of the scrotum increases.   A bulge in the groin area becomes red or purple and is painful to touch.   You have nausea or vomiting that does not go away.   You feel your heart beating much faster than normal.   You cannot have a bowel movement or pass gas.   You develop a fever of more than 102.0 F (38.9 C).  Document Released: 01/11/2009 Document Revised: 08/14/2011 Document Reviewed: 01/11/2009 [facility] Patient Information 2012 [place].

## 2012-04-26 NOTE — Progress Notes (Signed)
  Subjective:    Patient ID: Martin [name], male    DOB: 08-15-1961, 51 y.o.   MRN: [id number]  HPI CC: Hernia  Hernia: Started 1 yr ago. Evaluated previously by surgery byt unable to pay so was not repaired. Pain has increased over the past 2 wks. Ripping sensation  It is fairly constant but comes and goes. Alleviated with rest and pressure. Aggravated with bowel movements coughing standing urinating. Patient with daily soft bowel movements. Seen in the emergency department last Thursday for the hernia but nothing was done per patient as hernia is reducible. Denies hematochezia, constipation, emesis, severe abdominal pain.   Review of Systems  per history of present illness     Objective:   Physical Exam  general: No distress, thin Abdominal: Normal active bowel sounds, small mass in left lower quadrant medially towards the inguinal canal. Reducible with mild pain but immediately returns upon release of pressure. No masses scrotum or in inguinal canal.       Assessment & Plan:

## 2012-04-26 NOTE — Telephone Encounter (Signed)
Sent in refill for pt's folic acid to his pharmacy. Printed out percocet for Dr. Hilarie Fredrickson to sign and will call pt. To let him know when he can come and pick that up.

## 2012-04-26 NOTE — Assessment & Plan Note (Signed)
Direct versus indirect. Likely directed do to position. Refer to general surgery. Appointment made and patient referred prior to leaving clinic. Warning signs of incarcerated bowel discussed with patient and patient aware of reasons to obtain immediate surgical consultation. Patient to continue to focus on having loose daily bowel movements and to apply pressure as needed to keep hernia reduced.

## 2012-04-27 ENCOUNTER — Encounter: Payer: Self-pay | Admitting: Internal Medicine

## 2012-04-27 ENCOUNTER — Ambulatory Visit (AMBULATORY_SURGERY_CENTER): Payer: Self-pay | Admitting: Internal Medicine

## 2012-04-27 VITALS — BP 131/74 | HR 80 | Temp 97.8°F | Resp 18 | Ht 71.0 in | Wt 148.0 lb

## 2012-04-27 DIAGNOSIS — Z85048 Personal history of other malignant neoplasm of rectum, rectosigmoid junction, and anus: Secondary | ICD-10-CM

## 2012-04-27 DIAGNOSIS — R112 Nausea with vomiting, unspecified: Secondary | ICD-10-CM

## 2012-04-27 DIAGNOSIS — R197 Diarrhea, unspecified: Secondary | ICD-10-CM

## 2012-04-27 DIAGNOSIS — Z800 Family history of malignant neoplasm of digestive organs: Secondary | ICD-10-CM

## 2012-04-27 DIAGNOSIS — R110 Nausea: Secondary | ICD-10-CM

## 2012-04-27 DIAGNOSIS — R1110 Vomiting, unspecified: Secondary | ICD-10-CM

## 2012-04-27 DIAGNOSIS — K635 Polyp of colon: Secondary | ICD-10-CM

## 2012-04-27 DIAGNOSIS — K2990 Gastroduodenitis, unspecified, without bleeding: Secondary | ICD-10-CM

## 2012-04-27 DIAGNOSIS — D126 Benign neoplasm of colon, unspecified: Secondary | ICD-10-CM

## 2012-04-27 DIAGNOSIS — K2970 Gastritis, unspecified, without bleeding: Secondary | ICD-10-CM

## 2012-04-27 MED ORDER — SODIUM CHLORIDE 0.9 % IV SOLN: 500.0000 mL | INTRAVENOUS | Status: DC

## 2012-04-27 NOTE — Patient Instructions (Addendum)
Discharge instructions given with verbal understanding. Handouts on polyps,diverticulosis,hemorrhoids,esophagitis and gastritis given. Resume previous medications. Avoid NSAIDS. YOU HAD AN ENDOSCOPIC PROCEDURE TODAY AT [facility]: Refer to the procedure report that was given to you for any specific questions about what was found during the examination.  If the procedure report does not answer your questions, please call your gastroenterologist to clarify.  If you requested that your care partner not be given the details of your procedure findings, then the procedure report has been included in a sealed envelope for you to review at your convenience later.  YOU SHOULD EXPECT: Some feelings of bloating in the abdomen. Passage of more gas than usual.  Walking can help get rid of the air that was put into your GI tract during the procedure and reduce the bloating. If you had a lower endoscopy (such as a colonoscopy or flexible sigmoidoscopy) you may notice spotting of blood in your stool or on the toilet paper. If you underwent a bowel prep for your procedure, then you may not have a normal bowel movement for a few days.  DIET: Your first meal following the procedure should be a light meal and then it is ok to progress to your normal diet.  A half-sandwich or bowl of soup is an example of a good first meal.  Heavy or fried foods are harder to digest and may make you feel nauseous or bloated.  Likewise meals heavy in dairy and vegetables can cause extra gas to form and this can also increase the bloating.  Drink plenty of fluids but you should avoid alcoholic beverages for 24 hours.  ACTIVITY: Your care partner should take you home directly after the procedure.  You should plan to take it easy, moving slowly for the rest of the day.  You can resume normal activity the day after the procedure however you should NOT DRIVE or use heavy machinery for 24 hours (because of the sedation medicines  used during the test).    SYMPTOMS TO REPORT IMMEDIATELY: A gastroenterologist can be reached at any hour.  During normal business hours, 8:30 AM to 5:00 PM Monday through Friday, call [phone].  After hours and on weekends, please call the GI answering service at [phone] who will take a message and have the physician on call contact you.   Following lower endoscopy (colonoscopy or flexible sigmoidoscopy):  Excessive amounts of blood in the stool  Significant tenderness or worsening of abdominal pains  Swelling of the abdomen that is new, acute  Fever of 100F or higher  Following upper endoscopy (EGD)  Vomiting of blood or coffee ground material  New chest pain or pain under the shoulder blades  Painful or persistently difficult swallowing  New shortness of breath  Fever of 100F or higher  Black, tarry-looking stools  FOLLOW UP: If any biopsies were taken you will be contacted by phone or by letter within the next 1-3 weeks.  Call your gastroenterologist if you have not heard about the biopsies in 3 weeks.  Our staff will call the home number listed on your records the next business day following your procedure to check on you and address any questions or concerns that you may have at that time regarding the information given to you following your procedure. This is a courtesy call and so if there is no answer at the home number and we have not heard from you through the emergency physician on call, we will assume  that you have returned to your regular daily activities without incident.  SIGNATURES/CONFIDENTIALITY: You and/or your care partner have signed paperwork which will be entered into your electronic medical record.  These signatures attest to the fact that that the information above on your After Visit Summary has been reviewed and is understood.  Full responsibility of the confidentiality of this discharge information lies with you and/or your care-partner.

## 2012-04-27 NOTE — Op Note (Signed)
[employer]  [address]. [place], [address]   COLONOSCOPY PROCEDURE REPORT  PATIENT: Martin, [name].  MR#: [id number] BIRTHDATE: 08/16/1961 , 51  yrs. old GENDER: Male ENDOSCOPIST: Jerene Bears, MD REFERRED UY:QIHK Edrick Kins, M.D. PROCEDURE DATE:  04/27/2012 PROCEDURE:   Colonoscopy with biopsy and Colonoscopy with snare polypectomy ASA CLASS:   Class III INDICATIONS:chronic diarrhea. MEDICATIONS: propofol (Diprivan) 284m IV  DESCRIPTION OF PROCEDURE:   After the risks benefits and alternatives of the procedure were thoroughly explained, informed consent was obtained.  A digital rectal exam revealed no rectal mass.   The LB CF-H180AL [id number] endoscope was introduced through the anus and advanced to the cecum, which was identified by both the appendix and ileocecal valve. No adverse events experienced. The quality of the prep was good, using MoviPrep  The instrument was then slowly withdrawn as the colon was fully examined.   COLON FINDINGS: A sessile polyp measuring 3 mm in size was found in the rectosigmoid colon.  A polypectomy was performed with a cold snare.  The resection was complete and the polyp tissue was completely retrieved.   Mild diverticulosis was noted in the sigmoid colon.   Mild erythematous  mucosa was found in the left colon.  Multiple biopsies were performed using cold forceps. Random biopsies performed from right and left colon given history of diarrhea.          The scope was withdrawn and the procedure completed.  COMPLICATIONS: There were no complications.  ENDOSCOPIC IMPRESSION: 1.   Sessile polyp measuring 3 mm in size was found in the rectosigmoid colon; polypectomy was performed with a cold snare 2.   Mild diverticulosis was noted in the sigmoid colon 3.   Mild erythema was found in the left colon; multiple biopsies were performed using cold forceps.  Biopsies obtained from right and left colon given  diarrhea.  RECOMMENDATIONS: 1.  Await pathology results 2.  High fiber diet. 3.  Avoid NSAIDS 4.  Timing of repeat colonoscopy will be determined by pathology findings. 5.  You will receive a letter within 1-2 weeks with the results of your biopsy as well as final recommendations.  Please call my office if you have not received a letter after 3 weeks. 6. Office follow-up in 2-4 weeks.  eSigned:  JJerene Bears MD 04/27/2012 2:44 PM   cc: BJanne Napoleon MD BKavin Leech MD The Patient   PATIENT NAME:  [name], [name] MR#: [id number]

## 2012-04-27 NOTE — Op Note (Signed)
[place]  [address]. [place], [address]   ENDOSCOPY PROCEDURE REPORT  PATIENT: [name], Martin.  MR#: [id number] BIRTHDATE: 07/03/1961 , 51  yrs. old GENDER: Male ENDOSCOPIST: Jerene Bears, MD REFERRED BY:  Arturo Morton, M.D. PROCEDURE DATE:  04/27/2012 PROCEDURE:  EGD w/ biopsy ASA CLASS:     Class III INDICATIONS:  nausea.   vomiting.   abdominal pain.   unexplained diarrhea. MEDICATIONS: propofol (Diprivan) 31m IV TOPICAL ANESTHETIC: none  DESCRIPTION OF PROCEDURE: After the risks benefits and alternatives of the procedure were thoroughly explained, informed consent was obtained.  The [id number] was introduced through the mouth and advanced to the second portion of the duodenum. limited by Without limitations. The instrument was slowly withdrawn as the mucosa was fully examined.   ESOPHAGUS: Reflux esophagitis was found at the gastroesophageal junction.  Esophagitis was LA Grade A: breaks across 1-2 folds, <5 mm.   The esophagus was otherwise normal.  STOMACH: Moderate gastritis (inflammation) was found in the cardia and gastric fundus characterized by erythema.  Multiple biopsies were performed.   A small diverticulum was found in the gastric fundus.   Otherwise normal esophagus.  DUODENUM: The duodenal mucosa showed no abnormalities in the bulb and second portion of the duodenum.  Retroflexed views revealed See above.     The scope was then withdrawn from the patient and the procedure completed.  COMPLICATIONS: There were no complications.  ENDOSCOPIC IMPRESSION: 1.   Esophagitis consistent with reflux esophagitis at the gastroesophageal junction 2.   The esophagus was otherwise normal. 3.   Gastritis (inflammation) was found in the cardia and gastric fundus; multiple biopsies 4.   Diverticulum in the gastric fundus 5.   The duodenal mucosa showed no abnormalities in the bulb and second portion of the  duodenum  RECOMMENDATIONS: 1.  Await pathology results 2.  avoid NSAIDS 3.  follow-up of helicobacter pylori status, treat if indicated 4.  continue current meds, including pantoprazole  REPEAT EXAM:  eSigned:  JJerene Bears MD 04/27/2012 2:30 PM   CKJ:IZXYOFVSBenay Spice MD, BChrisandra Netters The Patient  PATIENT NAME:  [name], [name] MR#: [id number]

## 2012-04-28 ENCOUNTER — Telehealth: Payer: Self-pay | Admitting: *Deleted

## 2012-04-28 ENCOUNTER — Telehealth: Payer: Self-pay | Admitting: Internal Medicine

## 2012-04-28 NOTE — Telephone Encounter (Signed)
No answer at # given.  Left message on voicemail.

## 2012-04-28 NOTE — Telephone Encounter (Signed)
Called pt told him Work note will be at our front desk.

## 2012-05-03 ENCOUNTER — Encounter: Payer: Self-pay | Admitting: Internal Medicine

## 2012-05-03 ENCOUNTER — Ambulatory Visit: Admission: RE | Admit: 2012-05-03 | Payer: Self-pay | Source: Ambulatory Visit | Admitting: Radiation Oncology

## 2012-05-03 DIAGNOSIS — K52832 Lymphocytic colitis: Secondary | ICD-10-CM | POA: Insufficient documentation

## 2012-05-04 ENCOUNTER — Telehealth: Payer: Self-pay | Admitting: Internal Medicine

## 2012-05-04 MED ORDER — BUDESONIDE 3 MG PO CP24: ORAL_CAPSULE | ORAL | Status: DC

## 2012-05-04 NOTE — Telephone Encounter (Signed)
Informed pt that he has Lymphocytic colitis and need budesonide for 8 week and that I changed his appt with Dr Hilarie Fredrickson to October; pt stated understanding and asked about his pain med refill. Pt states he has enough to last until tomorrow. He states his pain doubles him over w/o the meds. The pain is located above his waist or at his navel . Informed  pt I will ask Dr Hilarie Fredrickson.  Please advise. Thanks.

## 2012-05-04 NOTE — Telephone Encounter (Signed)
Comments: repeat colon 10 yrs   Caren Griffins, please start budesonide 9 mg x8 weeks and have him followup in the office in 6. Diagnosis is lymphocytic colitis      Left message for pt to call back. I have ordered entocort for the pt at [facility]. Mailed path letter to pt.

## 2012-05-05 ENCOUNTER — Other Ambulatory Visit: Payer: Self-pay | Admitting: Gastroenterology

## 2012-05-05 ENCOUNTER — Telehealth: Payer: Self-pay | Admitting: Gastroenterology

## 2012-05-05 DIAGNOSIS — R109 Unspecified abdominal pain: Secondary | ICD-10-CM

## 2012-05-05 MED ORDER — OXYCODONE-ACETAMINOPHEN 5-325 MG PO TABS: ORAL_TABLET | ORAL | Status: DC

## 2012-05-05 NOTE — Telephone Encounter (Signed)
Yes we can provide a gap prescription for pain indication. He also has an upcoming hernia evaluation, which might be the cause of his pain

## 2012-05-05 NOTE — Telephone Encounter (Signed)
Pt came up to the office, he was given Rx for his pain to get him through until his upcoming appointment with Dr. Benay Spice. He was also given samples of Entocort, advised to take 9 mg daily for 8 weeks. He is to call us after 2 weeks to let us know how it is helping his Diarrhea. Pt stated understanding.

## 2012-05-05 NOTE — Telephone Encounter (Signed)
Spoke with pt and let him know he needed to get his pain medication from Dr. Benay Spice. Pt states he has not seen him in a while. Checked Epic and pt had an appt in August and did not go. Pt has appt scheduled with Dr. Benay Spice for 05/11/12. Pt asked if Dr. Hilarie Fredrickson would give him a script for pain medication to get him through until his appt with Dr. Benay Spice on 05/11/12. Dr. Hilarie Fredrickson please advise.

## 2012-05-05 NOTE — Telephone Encounter (Signed)
Microscopic colitis is usually not a painful process. Spasm associated with diarrhea, can cause abdominal pain though. I would like him to seek narcotic pain medication renewals from his oncologist. Thank you

## 2012-05-05 NOTE — Telephone Encounter (Signed)
Spoke to pt. About Entocort that Dr. Hilarie Fredrickson prescribed him. Let him know the pharmacy has been calling me about the cost ($2000+) I told him I will ask Dr. Hilarie Fredrickson of another kind of medication he can possibly take and call him back today. Pt. Thanked me and asked about his pain meds. I told him nothing has been brought to my attention, but I will ask Dr. Hilarie Fredrickson about that as well.

## 2012-05-06 ENCOUNTER — Encounter (INDEPENDENT_AMBULATORY_CARE_PROVIDER_SITE_OTHER): Payer: Self-pay | Admitting: General Surgery

## 2012-05-06 ENCOUNTER — Ambulatory Visit (INDEPENDENT_AMBULATORY_CARE_PROVIDER_SITE_OTHER): Payer: PRIVATE HEALTH INSURANCE | Admitting: Surgery

## 2012-05-06 VITALS — BP 134/78 | HR 78 | Temp 97.6°F | Resp 18 | Ht 71.0 in | Wt 142.0 lb

## 2012-05-06 DIAGNOSIS — K4090 Unilateral inguinal hernia, without obstruction or gangrene, not specified as recurrent: Secondary | ICD-10-CM

## 2012-05-06 NOTE — Progress Notes (Signed)
General Surgery [facility]  Chief Complaint  Patient presents with  . Inguinal Hernia    HISTORY: Patient is a 51 year old white male well known to my practice from his history of squamous cell carcinoma of the anus. Patient has had a long-standing left inguinal hernia. We have waited until he completed adjuvant chemotherapy and radiation therapy before proceeding with hernia repair. Patient has recently undergone a gastrointestinal workup at [place].  He is having problems with chronic abdominal pain and chronic diarrhea. Patient presents today to discuss left inguinal hernia repair.  Past Medical History  Diagnosis Date  . Inguinal hernia   . GERD (gastroesophageal reflux disease)   . Hypertension     EKG 12/12 EPIC   no  PCP- states increased lately but hasnt been diagnosed formally  . Pneumonia     as child, cough at present with no fever  . Hemorrhoid     internal  . 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 Allergy 2 2013  . Anxiety   . Arthritis     DDD lumbar, arthritis knees  . DJD (degenerative joint disease) of lumbar spine      Current Outpatient Prescriptions  Medication Sig Dispense Refill  . budesonide (ENTOCORT EC) 3 MG 24 hr capsule Take 3 capsules by mouth each morning x 8 weeks.  168 capsule  0  . diazepam (VALIUM) 5 MG tablet TAKE 1 TABLET BY MOUTH EVERY NIGHT AT BEDTIME AS NEEDED  30 tablet  0  . emollient (BIAFINE) cream Apply 170 g topically 2 (two) times daily as needed. Radiation burns      . folic acid (FOLVITE) 1 MG tablet Take 1 tablet (1 mg total) by mouth daily.  30 tablet  6  . loperamide (IMODIUM) 2 MG capsule Take 4 mg by mouth 4 (four) times daily as needed. For diarrhea or loose stools      . oxyCODONE-acetaminophen (PERCOCET/ROXICET) 5-325 MG per tablet Take 1-2 tablets by mouth every 4-6 hours as needed for pain  20 tablet  0  . pantoprazole (PROTONIX) 40 MG tablet Take 1 tablet (40 mg  total) by mouth daily.  90 tablet  3  . psyllium (METAMUCIL) 0.52 G capsule Take 0.52 g by mouth 2 (two) times daily.      . vitamin B-12 (CYANOCOBALAMIN) 1000 MCG tablet Take 1 tablet (1,000 mcg total) by mouth daily.  30 tablet  6  . hyoscyamine (LEVSIN/SL) 0.125 MG SL tablet Place 1 tablet (0.125 mg total) under the tongue every 4 (four) hours as needed for cramping.  30 tablet  0  . promethazine (PHENERGAN) 12.5 MG tablet Take 1 tablet (12.5 mg total) by mouth every 6 (six) hours as needed for nausea.  30 tablet  0     Allergies  Allergen Reactions  . Aspirin Swelling  . Codeine Nausea Only  . Morphine And Related Itching     Family History  Problem Relation Age of Onset  . Colon cancer Father   . Asthma Father   . Cancer Father     prostate  . Hypertension Father   . Asthma Mother   . Hypertension Mother   . Stomach cancer Neg Hx   . Esophageal cancer Neg Hx      History   Social History  . Marital Status: Single    Spouse Name: N/A    Number of Children: 3  .  Years of Education: N/A   Occupational History  . unemployed    Social History Main Topics  . Smoking status: Current Everyday Smoker -- 1.0 packs/day for 41 years    Types: Cigarettes  . Smokeless tobacco: Never Used  . Alcohol Use: 3.6 oz/week    6 Cans of beer per week     weekend    6pk weekend, beer , less at present time 3/17  . Drug Use: Yes    Special: Marijuana     marijuana in past, occasionally now-last use 3-4 weeks ago  . Sexually Active: None   Other Topics Concern  . None   Social History Narrative  . None     REVIEW OF SYSTEMS - PERTINENT POSITIVES ONLY: Persistent pain left groin, pain with lifting. Denies signs or symptoms of intestinal obstruction. No prior hernia repair.  EXAM: Filed Vitals:   05/06/12 1334  BP: 134/78  Pulse: 78  Temp: 97.6 F (36.4 C)  Resp: 18    HEENT: normocephalic; pupils equal and reactive; sclerae clear; dentition good; mucous membranes  moist NECK:  symmetric on extension; no palpable anterior or posterior cervical lymphadenopathy; no supraclavicular masses; no tenderness CHEST: clear to auscultation bilaterally without rales, rhonchi, or wheezes CARDIAC: regular rate and rhythm without significant murmur; peripheral pulses are full ABDOMEN: soft without distension; bowel sounds present; no mass; no hepatosplenomegaly; obvious left inguinal hernia, soft, reducible, minimally tender; no evidence of right inguinal hernia. EXT:  non-tender without edema; no deformity NEURO: no gross focal deficits; no sign of tremor   LABORATORY RESULTS: See [facility] ([facility]Epic) for most recent results   RADIOLOGY RESULTS: See [facility] (CHL-Epic) for most recent results   IMPRESSION: Left inguinal hernia, reducible, symptomatic  PLAN: The patient and I and his wife again discussed the indications for repair. We discussed the use of prosthetic mesh. We discussed performing this as an outpatient surgery. We discussed restrictions on his activities after the procedure and his return to work. They understand and wish to proceed.  Patient continues to request narcotic analgesics for inguinal hernia pain. I have given him a prescription for Percocet #22 use until the time of his upcoming surgical procedure.  The risks and benefits of the procedure have been discussed at length with the patient.  The patient understands the proposed procedure, potential alternative treatments, and the course of recovery to be expected.  All of the patient's questions have been answered at this time.  The patient wishes to proceed with surgery.  Earnstine Regal, MD, [facility]   Visit Diagnoses: 1. Inguinal hernia     Primary Care Physician: Chrisandra Netters, MD

## 2012-05-06 NOTE — Patient Instructions (Signed)
[facility]  HERNIA REPAIR POST OP INSTRUCTIONS  Always review your discharge instruction sheet given to you by the facility where your surgery was performed.  1. A  prescription for pain medication may be given to you upon discharge.  Take your pain medication as prescribed.  If narcotic pain medicine is not needed, then you may take acetaminophen (Tylenol) or ibuprofen (Advil) as needed. 2. Take your usually prescribed medications unless otherwise directed. 3. If you need a refill on your pain medication, please contact your pharmacy.  They will contact our office to request authorization. Prescriptions will not be filled after 5 pm daily or on weekends. 4. You should follow a light diet the first 24 hours after arrival home, such as soup and crackers or toast.  Be sure to include plenty of fluids daily.  Resume your normal diet the day after surgery. 5. Most patients will experience some swelling and bruising around the surgical site.  Ice packs and reclining will help.  Swelling and bruising can take several days to resolve.  6. It is common to experience some constipation if taking pain medication after surgery.  Increasing fluid intake and taking a stool softener (such as Colace) will usually help or prevent this problem from occurring.  A mild laxative (Milk of Magnesia or Miralax) should be taken according to package directions if there are no bowel movements after 48 hours. 7. Unless discharge instructions indicate otherwise, you may remove your bandages 24-48 hours after surgery, and you may shower at that time.  You may have steri-strips (small skin tapes) in place directly over the incision.  These strips should be left on the skin for 7-10 days.  If your surgeon used skin glue on the incision, you may shower in 24 hours.  The glue will flake off over the next 2-3 weeks.  Any sutures or staples will be removed at the office during your follow-up visit. 8. ACTIVITIES:  You  may resume regular (light) daily activities beginning the next day-such as daily self-care, walking, climbing stairs-gradually increasing activities as tolerated.  You may have sexual intercourse when it is comfortable.  Refrain from any heavy lifting or straining until approved by your doctor.  You may drive when you are no longer taking prescription pain medication, you can comfortably wear a seatbelt, and you can safely maneuver your car and apply brakes. 9. You should see your doctor in the office for a follow-up appointment approximately 2-3 weeks after your surgery.  Make sure that you call for this appointment within a day or two after you arrive home to insure a convenient appointment time.  WHEN TO CALL YOUR DOCTOR: 1. Fever greater than 101.0 2. Inability to urinate 3. Persistent nausea and/or vomiting 4. Extreme swelling or bruising 5. Continued bleeding from incision 6. Increased pain, redness, or drainage from the incision  The clinic staff is available to answer your questions during regular business hours.  Please don't hesitate to call and ask to speak to one of the nurses for clinical concerns.  If you have a medical emergency, go to the nearest emergency room or call 911.  A surgeon from [facility] is always on call for the hospital.   [facility] [address], [address], [place], [place]  [address]  [phone] ? [phone] ? FAX [phone] [email or web address]   

## 2012-05-07 NOTE — Progress Notes (Signed)
No labs needed-had some 04/21/12

## 2012-05-11 ENCOUNTER — Encounter (HOSPITAL_BASED_OUTPATIENT_CLINIC_OR_DEPARTMENT_OTHER): Payer: Self-pay | Admitting: Anesthesiology

## 2012-05-11 ENCOUNTER — Ambulatory Visit: Payer: Self-pay | Admitting: Oncology

## 2012-05-11 ENCOUNTER — Encounter (HOSPITAL_BASED_OUTPATIENT_CLINIC_OR_DEPARTMENT_OTHER)
Admission: RE | Disposition: A | Discharge status: Home / Self Care | Payer: Self-pay | Source: Ambulatory Visit | Attending: Surgery

## 2012-05-11 ENCOUNTER — Telehealth (INDEPENDENT_AMBULATORY_CARE_PROVIDER_SITE_OTHER): Payer: Self-pay

## 2012-05-11 ENCOUNTER — Encounter (HOSPITAL_BASED_OUTPATIENT_CLINIC_OR_DEPARTMENT_OTHER): Payer: Self-pay | Admitting: *Deleted

## 2012-05-11 ENCOUNTER — Ambulatory Visit (HOSPITAL_BASED_OUTPATIENT_CLINIC_OR_DEPARTMENT_OTHER): Payer: Self-pay | Admitting: Anesthesiology

## 2012-05-11 ENCOUNTER — Ambulatory Visit (HOSPITAL_BASED_OUTPATIENT_CLINIC_OR_DEPARTMENT_OTHER)
Admission: RE | Admit: 2012-05-11 | Discharge: 2012-05-11 | Disposition: A | Discharge status: Home / Self Care | Payer: Self-pay | Source: Ambulatory Visit | Attending: Surgery | Admitting: Surgery

## 2012-05-11 DIAGNOSIS — M47817 Spondylosis without myelopathy or radiculopathy, lumbosacral region: Secondary | ICD-10-CM | POA: Insufficient documentation

## 2012-05-11 DIAGNOSIS — F411 Generalized anxiety disorder: Secondary | ICD-10-CM | POA: Insufficient documentation

## 2012-05-11 DIAGNOSIS — K4090 Unilateral inguinal hernia, without obstruction or gangrene, not specified as recurrent: Secondary | ICD-10-CM

## 2012-05-11 DIAGNOSIS — R110 Nausea: Secondary | ICD-10-CM

## 2012-05-11 DIAGNOSIS — R1110 Vomiting, unspecified: Secondary | ICD-10-CM

## 2012-05-11 DIAGNOSIS — K219 Gastro-esophageal reflux disease without esophagitis: Secondary | ICD-10-CM | POA: Insufficient documentation

## 2012-05-11 DIAGNOSIS — R197 Diarrhea, unspecified: Secondary | ICD-10-CM

## 2012-05-11 DIAGNOSIS — I10 Essential (primary) hypertension: Secondary | ICD-10-CM | POA: Insufficient documentation

## 2012-05-11 DIAGNOSIS — Z85048 Personal history of other malignant neoplasm of rectum, rectosigmoid junction, and anus: Secondary | ICD-10-CM

## 2012-05-11 DIAGNOSIS — M1710 Unilateral primary osteoarthritis, unspecified knee: Secondary | ICD-10-CM | POA: Insufficient documentation

## 2012-05-11 HISTORY — PX: INGUINAL HERNIA REPAIR: SHX194

## 2012-05-11 SURGERY — REPAIR, HERNIA, INGUINAL, ADULT
Anesthesia: General | Site: Groin | Laterality: Left | Wound class: Clean

## 2012-05-11 MED ORDER — KETOROLAC TROMETHAMINE 30 MG/ML IJ SOLN
INTRAMUSCULAR | Status: DC | PRN
  Administered 2012-05-11: 30 mg via INTRAVENOUS

## 2012-05-11 MED ORDER — BUPIVACAINE HCL (PF) 0.5 % IJ SOLN
INTRAMUSCULAR | Status: DC | PRN
  Administered 2012-05-11: 20 mL

## 2012-05-11 MED ORDER — CEFAZOLIN SODIUM-DEXTROSE 2-3 GM-% IV SOLR
2.0000 g | INTRAVENOUS | Status: AC
  Administered 2012-05-11: 2 g via INTRAVENOUS

## 2012-05-11 MED ORDER — ONDANSETRON HCL 4 MG/2ML IJ SOLN
INTRAMUSCULAR | Status: DC | PRN
  Administered 2012-05-11: 4 mg via INTRAVENOUS

## 2012-05-11 MED ORDER — HYDROMORPHONE HCL PF 1 MG/ML IJ SOLN
0.2500 mg | INTRAMUSCULAR | Status: DC | PRN
  Administered 2012-05-11 (×2): 0.5 mg via INTRAVENOUS

## 2012-05-11 MED ORDER — ACETAMINOPHEN 10 MG/ML IV SOLN
1000.0000 mg | Freq: Once | INTRAVENOUS | Status: AC
  Administered 2012-05-11: 1000 mg via INTRAVENOUS

## 2012-05-11 MED ORDER — MIDAZOLAM HCL 2 MG/2ML IJ SOLN
1.0000 mg | INTRAMUSCULAR | Status: DC | PRN
  Administered 2012-05-11: 2 mg via INTRAVENOUS

## 2012-05-11 MED ORDER — OXYCODONE-ACETAMINOPHEN 5-325 MG PO TABS: 1.0000 | ORAL_TABLET | ORAL | Status: DC | PRN

## 2012-05-11 MED ORDER — LACTATED RINGERS IV SOLN
INTRAVENOUS | Status: DC
  Administered 2012-05-11 (×2): via INTRAVENOUS

## 2012-05-11 MED ORDER — OXYCODONE HCL 5 MG/5ML PO SOLN: 5.0000 mg | Freq: Once | ORAL | Status: AC | PRN

## 2012-05-11 MED ORDER — PROPOFOL 10 MG/ML IV BOLUS
INTRAVENOUS | Status: DC | PRN
  Administered 2012-05-11: 200 mg via INTRAVENOUS

## 2012-05-11 MED ORDER — FENTANYL CITRATE 0.05 MG/ML IJ SOLN
50.0000 ug | INTRAMUSCULAR | Status: DC | PRN
  Administered 2012-05-11: 100 ug via INTRAVENOUS

## 2012-05-11 MED ORDER — OXYCODONE HCL 5 MG PO TABS
5.0000 mg | ORAL_TABLET | Freq: Once | ORAL | Status: AC | PRN
  Administered 2012-05-11: 5 mg via ORAL

## 2012-05-11 MED ORDER — SCOPOLAMINE 1 MG/3DAYS TD PT72
1.0000 | MEDICATED_PATCH | Freq: Once | TRANSDERMAL | Status: DC
  Administered 2012-05-11: 1.5 mg via TRANSDERMAL

## 2012-05-11 MED ORDER — DEXAMETHASONE SODIUM PHOSPHATE 4 MG/ML IJ SOLN
INTRAMUSCULAR | Status: DC | PRN
  Administered 2012-05-11: 10 mg via INTRAVENOUS

## 2012-05-11 MED ORDER — PROMETHAZINE HCL 12.5 MG PO TABS: 12.5000 mg | ORAL_TABLET | Freq: Four times a day (QID) | ORAL | Status: DC | PRN

## 2012-05-11 MED ORDER — METOCLOPRAMIDE HCL 5 MG/ML IJ SOLN: 10.0000 mg | Freq: Once | INTRAMUSCULAR | Status: DC | PRN

## 2012-05-11 MED ORDER — LIDOCAINE HCL (CARDIAC) 20 MG/ML IV SOLN
INTRAVENOUS | Status: DC | PRN
  Administered 2012-05-11: 40 mg via INTRAVENOUS

## 2012-05-11 SURGICAL SUPPLY — 41 items
BENZOIN TINCTURE PRP APPL 2/3 (GAUZE/BANDAGES/DRESSINGS) ×2 IMPLANT
BLADE SURG 15 STRL LF DISP TIS (BLADE) ×1 IMPLANT
BLADE SURG 15 STRL SS (BLADE) ×1
BLADE SURG ROTATE 9660 (MISCELLANEOUS) ×2 IMPLANT
CANISTER SUCTION 1200CC (MISCELLANEOUS) IMPLANT
CHLORAPREP W/TINT 26ML (MISCELLANEOUS) ×2 IMPLANT
CLEANER CAUTERY TIP 5X5 PAD (MISCELLANEOUS) ×1 IMPLANT
CLOTH BEACON ORANGE TIMEOUT ST (SAFETY) ×2 IMPLANT
COVER MAYO STAND STRL (DRAPES) ×2 IMPLANT
COVER TABLE BACK 60X90 (DRAPES) ×2 IMPLANT
DECANTER SPIKE VIAL GLASS SM (MISCELLANEOUS) IMPLANT
DRAIN PENROSE 1/2X12 LTX STRL (WOUND CARE) ×2 IMPLANT
DRAPE PED LAPAROTOMY (DRAPES) ×2 IMPLANT
DRAPE UTILITY XL STRL (DRAPES) ×2 IMPLANT
ELECT REM PT RETURN 9FT ADLT (ELECTROSURGICAL) ×2
ELECTRODE REM PT RTRN 9FT ADLT (ELECTROSURGICAL) ×1 IMPLANT
GAUZE SPONGE 4X4 12PLY STRL LF (GAUZE/BANDAGES/DRESSINGS) IMPLANT
GLOVE ECLIPSE 6.5 STRL STRAW (GLOVE) ×2 IMPLANT
GLOVE SURG ORTHO 8.0 STRL STRW (GLOVE) ×2 IMPLANT
GOWN PREVENTION PLUS XLARGE (GOWN DISPOSABLE) ×2 IMPLANT
GOWN PREVENTION PLUS XXLARGE (GOWN DISPOSABLE) ×2 IMPLANT
MESH ULTRAPRO 3X6 7.6X15CM (Mesh General) ×2 IMPLANT
NEEDLE HYPO 25X1 1.5 SAFETY (NEEDLE) ×2 IMPLANT
NS IRRIG 1000ML POUR BTL (IV SOLUTION) ×2 IMPLANT
PACK BASIN DAY SURGERY FS (CUSTOM PROCEDURE TRAY) ×2 IMPLANT
PAD CLEANER CAUTERY TIP 5X5 (MISCELLANEOUS) ×1
PENCIL BUTTON HOLSTER BLD 10FT (ELECTRODE) ×2 IMPLANT
SLEEVE SCD COMPRESS KNEE MED (MISCELLANEOUS) ×2 IMPLANT
STRIP CLOSURE SKIN 1/2X4 (GAUZE/BANDAGES/DRESSINGS) ×2 IMPLANT
SUT MNCRL AB 4-0 PS2 18 (SUTURE) ×2 IMPLANT
SUT NOVA NAB GS-22 2 0 T19 (SUTURE) ×4 IMPLANT
SUT SILK 2 0 SH (SUTURE) ×2 IMPLANT
SUT SILK 2 0 TIES 17X18 (SUTURE)
SUT SILK 2-0 18XBRD TIE BLK (SUTURE) IMPLANT
SUT VICRYL 3-0 CR8 SH (SUTURE) ×2 IMPLANT
SYR CONTROL 10ML LL (SYRINGE) ×2 IMPLANT
TOWEL OR 17X24 6PK STRL BLUE (TOWEL DISPOSABLE) ×2 IMPLANT
TOWEL OR NON WOVEN STRL DISP B (DISPOSABLE) ×2 IMPLANT
TUBE CONNECTING 20X1/4 (TUBING) IMPLANT
WATER STERILE IRR 1000ML POUR (IV SOLUTION) IMPLANT
YANKAUER SUCT BULB TIP NO VENT (SUCTIONS) IMPLANT

## 2012-05-11 NOTE — Interval H&P Note (Signed)
History and Physical Interval Note:  05/11/2012 7:22 AM  Martin [name]  has presented today for surgery, with the diagnosis of left inguinal hernia.  The various methods of treatment have been discussed with the patient and family. After consideration of risks, benefits and other options for treatment, the patient has consented to    Procedure(s) (LRB): HERNIA REPAIR INGUINAL ADULT (Left) as a surgical intervention .    The patient's history has been reviewed, patient examined, no change in status, stable for surgery.  I have reviewed the patient's chart and labs.  Questions were answered to the patient's satisfaction.    Earnstine Regal, MD, [facility] Office: [place]

## 2012-05-11 NOTE — Op Note (Signed)
Inguinal Hernia, Open, Procedure Note  Pre-operative Diagnosis:  Left inguinal hernia, reducible  Post-operative Diagnosis: same  Surgeon:  Earnstine Regal, MD, FACS  Anesthesia:  General  Indications: The patient presented with a left, reducible hernia.    Procedure Details  The patient was seen again in the Holding Room. The risks, benefits, complications, treatment options, and expected outcomes were discussed with the patient.  There was concurrence with the proposed plan, and informed consent was obtained. The site of surgery was properly noted/marked. The patient was taken to the Operating Room, identified by name, and the procedure verified as hernia repair. A Time Out was held and the above information confirmed.  The patient was placed in the supine position and underwent induction of anesthesia.  The lower abdomen and groin was prepped and draped in the usual strict aseptic fashion.  After ascertaining that an adequate level of anesthesia had been obtained, and incision is made in the groin with a #10 blade.  Dissection is carried through the subcutaneous tissues and hemostasis obtained with the electrocautery.  A Gelpi retractor is placed for exposure.  The external oblique fascia is incised in line with it's fibers and extended through the external inguinal ring.  The cord structures are dissected out of the inguinal canal and encircled with a Penrose drain.  The floor of the inguinal canal is dissected out.  The cord is explored and a moderate hernia sac is dissected out up to the level of the internal inguinal ring.  A high ligation of the sac is performed with a 2-0 silk suture, and the sac is excised and discarded.  The floor of the inguinal canal is reconstructed with a sheet of Ethicon Ultrapro mesh cut to the appropriate dimensions.  It is secured to the pubic tubercle with a 2-0 Novafil suture and along the inguinal ligament with a running 2-0 Novafil suture.  Mesh is split to  accommodate the cord structures.  The superior edge of the mesh is secured to the transversalis and internal oblique muscles with interrupted 2-0 Novafil sutures.  The tails of the mesh are overlapped lateral to the cord structures and secured to the inguinal ligament with interrupted 2-0 Novafil sutures to recreate the internal inguinal ring.  Cord structures are returned to the inguinal canal.  Local anesthetic is infiltrated throughout the field.  External oblique fascia is closed with interrupted 3-0 Vicryl sutures.  Subcutaneous tissues are closed with interrupted 3-0 Vicryl sutures.  Skin is anesthetized with local anesthetic, and the skin edges re-approximated with a running 4-0 Monocryl suture.  Wound is washed and dried and benzoin and steristrips are applied.  A gauze dressing is then applied.  Instrument, sponge, and needle counts were correct prior to closure and at the conclusion of the case.  Earnstine Regal, MD, [facility]   Findings: Hernia as above  Estimated Blood Loss: Minimal         Specimens: None  Complications: None; patient tolerated the procedure well.         Disposition: PACU - hemodynamically stable.         Condition: stable

## 2012-05-11 NOTE — Telephone Encounter (Signed)
Unable to reach pt via phone. LMOM for pt to call to confirm appt in system.

## 2012-05-11 NOTE — Anesthesia Procedure Notes (Addendum)
Anesthesia Regional Block:  TAP block  Pre-Anesthetic Checklist: ,, timeout performed, Correct Patient, Correct Site, Correct Laterality, Correct Procedure, Correct Position, site marked, Risks and benefits discussed,  Surgical consent,  Pre-op evaluation,  At surgeon's request and post-op pain management  Laterality: Left  Prep: chloraprep       Needles:  Injection technique: Single-shot  Needle Type: Echogenic Needle     Needle Length: 9cm  Needle Gauge: 21    Additional Needles:  Procedures: ultrasound guided TAP block Narrative:  Start time: 05/11/2012 7:07 AM End time: 05/11/2012 7:14 AM Injection made incrementally with aspirations every 5 mL.  Performed by: Personally  Anesthesiologist: Nada Libman, MD  Additional Notes: Ultrasound guidance used to: id relevant anatomy, confirm needle position, local anesthetic spread, avoidance of vascular puncture. Picture saved. No complications. Block performed personally by Jessy Oto. Albertina Parr, MD    TAP block Procedure Name: LMA Insertion Date/Time: 05/11/2012 7:39 AM Performed by: Lyndee Leo Pre-anesthesia Checklist: Patient identified, Emergency Drugs available, Suction available and Patient being monitored Patient Re-evaluated:Patient Re-evaluated prior to inductionOxygen Delivery Method: Circle System Utilized Preoxygenation: Pre-oxygenation with 100% oxygen Intubation Type: IV induction Ventilation: Mask ventilation without difficulty LMA: LMA inserted LMA Size: 4.0 Number of attempts: 1 Airway Equipment and Method: bite block Placement Confirmation: positive ETCO2 Tube secured with: Tape Dental Injury: Teeth and Oropharynx as per pre-operative assessment  Comments: Severe gum disease noted with several teeth with exposed roots.  No change in patient's usual state post placement of LMA.

## 2012-05-11 NOTE — Anesthesia Postprocedure Evaluation (Signed)
Anesthesia Post Note  Patient: Martin [name]  Procedure(s) Performed: Procedure(s) (LRB): HERNIA REPAIR INGUINAL ADULT (Left)  Anesthesia type: General  Patient location: PACU  Post pain: Pain level controlled  Post assessment: Patient's Cardiovascular Status Stable  Last Vitals:  Filed Vitals:   05/11/12 0945  BP: 152/87  Pulse: 71  Temp:   Resp: 16    Post vital signs: Reviewed and stable  Level of consciousness: alert  Complications: No apparent anesthesia complications

## 2012-05-11 NOTE — H&P (View-Only) (Signed)
General Surgery [facility]  Chief Complaint  Patient presents with  . Inguinal Hernia    HISTORY: Patient is a 51 year old white male well known to my practice from his history of squamous cell carcinoma of the anus. Patient has had a long-standing left inguinal hernia. We have waited until he completed adjuvant chemotherapy and radiation therapy before proceeding with hernia repair. Patient has recently undergone a gastrointestinal workup at [facility].  He is having problems with chronic abdominal pain and chronic diarrhea. Patient presents today to discuss left inguinal hernia repair.  Past Medical History  Diagnosis Date  . Inguinal hernia   . GERD (gastroesophageal reflux disease)   . Hypertension     EKG 12/12 EPIC   no  PCP- states increased lately but hasnt been diagnosed formally  . Pneumonia     as child, cough at present with no fever  . Hemorrhoid     internal  . 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 Allergy 2 2013  . Anxiety   . Arthritis     DDD lumbar, arthritis knees  . DJD (degenerative joint disease) of lumbar spine      Current Outpatient Prescriptions  Medication Sig Dispense Refill  . budesonide (ENTOCORT EC) 3 MG 24 hr capsule Take 3 capsules by mouth each morning x 8 weeks.  168 capsule  0  . diazepam (VALIUM) 5 MG tablet TAKE 1 TABLET BY MOUTH EVERY NIGHT AT BEDTIME AS NEEDED  30 tablet  0  . emollient (BIAFINE) cream Apply 170 g topically 2 (two) times daily as needed. Radiation burns      . folic acid (FOLVITE) 1 MG tablet Take 1 tablet (1 mg total) by mouth daily.  30 tablet  6  . loperamide (IMODIUM) 2 MG capsule Take 4 mg by mouth 4 (four) times daily as needed. For diarrhea or loose stools      . oxyCODONE-acetaminophen (PERCOCET/ROXICET) 5-325 MG per tablet Take 1-2 tablets by mouth every 4-6 hours as needed for pain  20 tablet  0  . pantoprazole (PROTONIX) 40 MG tablet Take 1 tablet (40 mg  total) by mouth daily.  90 tablet  3  . psyllium (METAMUCIL) 0.52 G capsule Take 0.52 g by mouth 2 (two) times daily.      . vitamin B-12 (CYANOCOBALAMIN) 1000 MCG tablet Take 1 tablet (1,000 mcg total) by mouth daily.  30 tablet  6  . hyoscyamine (LEVSIN/SL) 0.125 MG SL tablet Place 1 tablet (0.125 mg total) under the tongue every 4 (four) hours as needed for cramping.  30 tablet  0  . promethazine (PHENERGAN) 12.5 MG tablet Take 1 tablet (12.5 mg total) by mouth every 6 (six) hours as needed for nausea.  30 tablet  0     Allergies  Allergen Reactions  . Aspirin Swelling  . Codeine Nausea Only  . Morphine And Related Itching     Family History  Problem Relation Age of Onset  . Colon cancer Father   . Asthma Father   . Cancer Father     prostate  . Hypertension Father   . Asthma Mother   . Hypertension Mother   . Stomach cancer Neg Hx   . Esophageal cancer Neg Hx      History   Social History  . Marital Status: Single    Spouse Name: N/A    Number of Children: 3  .  Years of Education: N/A   Occupational History  . unemployed    Social History Main Topics  . Smoking status: Current Everyday Smoker -- 1.0 packs/day for 41 years    Types: Cigarettes  . Smokeless tobacco: Never Used  . Alcohol Use: 3.6 oz/week    6 Cans of beer per week     weekend    6pk weekend, beer , less at present time 3/17  . Drug Use: Yes    Special: Marijuana     marijuana in past, occasionally now-last use 3-4 weeks ago  . Sexually Active: None   Other Topics Concern  . None   Social History Narrative  . None     REVIEW OF SYSTEMS - PERTINENT POSITIVES ONLY: Persistent pain left groin, pain with lifting. Denies signs or symptoms of intestinal obstruction. No prior hernia repair.  EXAM: Filed Vitals:   05/06/12 1334  BP: 134/78  Pulse: 78  Temp: 97.6 F (36.4 C)  Resp: 18    HEENT: normocephalic; pupils equal and reactive; sclerae clear; dentition good; mucous membranes  moist NECK:  symmetric on extension; no palpable anterior or posterior cervical lymphadenopathy; no supraclavicular masses; no tenderness CHEST: clear to auscultation bilaterally without rales, rhonchi, or wheezes CARDIAC: regular rate and rhythm without significant murmur; peripheral pulses are full ABDOMEN: soft without distension; bowel sounds present; no mass; no hepatosplenomegaly; obvious left inguinal hernia, soft, reducible, minimally tender; no evidence of right inguinal hernia. EXT:  non-tender without edema; no deformity NEURO: no gross focal deficits; no sign of tremor   LABORATORY RESULTS: See [facility] (CHL-Epic) for most recent results   RADIOLOGY RESULTS: See [facility] (CHL-Epic) for most recent results   IMPRESSION: Left inguinal hernia, reducible, symptomatic  PLAN: The patient and I and his wife again discussed the indications for repair. We discussed the use of prosthetic mesh. We discussed performing this as an outpatient surgery. We discussed restrictions on his activities after the procedure and his return to work. They understand and wish to proceed.  Patient continues to request narcotic analgesics for inguinal hernia pain. I have given him a prescription for Percocet #22 use until the time of his upcoming surgical procedure.  The risks and benefits of the procedure have been discussed at length with the patient.  The patient understands the proposed procedure, potential alternative treatments, and the course of recovery to be expected.  All of the patient's questions have been answered at this time.  The patient wishes to proceed with surgery.  Earnstine Regal, MD, [facility]   Visit Diagnoses: 1. Inguinal hernia     Primary Care Physician: Chrisandra Netters, MD

## 2012-05-11 NOTE — Transfer of Care (Signed)
Immediate Anesthesia Transfer of Care Note  Patient: Martin [name]  Procedure(s) Performed: Procedure(s) (LRB): HERNIA REPAIR INGUINAL ADULT (Left)  Patient Location: PACU  Anesthesia Type: GA combined with regional for post-op pain  Level of Consciousness: sedated and patient cooperative  Airway & Oxygen Therapy: Patient Spontanous Breathing and Patient connected to face mask oxygen  Post-op Assessment: Report given to PACU RN  Post vital signs: Reviewed and stable  Complications: No apparent anesthesia complications

## 2012-05-11 NOTE — Anesthesia Preprocedure Evaluation (Addendum)
Anesthesia Evaluation  Patient identified by MRN, date of birth, ID band Patient awake    Reviewed: Allergy & Precautions, H&P , NPO status , Patient's Chart, lab work & pertinent test results, reviewed documented beta blocker date and time   Airway Mallampati: II TM Distance: >3 FB Neck ROM: full    Dental   Pulmonary pneumonia -, resolved,  breath sounds clear to auscultation        Cardiovascular hypertension, On Medications negative cardio ROS  Rhythm:regular     Neuro/Psych PSYCHIATRIC DISORDERS Anxiety negative neurological ROS     GI/Hepatic Neg liver ROS, GERD-  Medicated and Controlled,  Endo/Other  negative endocrine ROS  Renal/GU negative Renal ROS  negative genitourinary   Musculoskeletal   Abdominal   Peds  Hematology negative hematology ROS (+)   Anesthesia Other Findings See surgeon's H&P   Reproductive/Obstetrics negative OB ROS                           Anesthesia Physical Anesthesia Plan  ASA: II  Anesthesia Plan: General   Post-op Pain Management:    Induction: Intravenous  Airway Management Planned: LMA  Additional Equipment:   Intra-op Plan:   Post-operative Plan: Extubation in OR  Informed Consent: I have reviewed the patients History and Physical, chart, labs and discussed the procedure including the risks, benefits and alternatives for the proposed anesthesia with the patient or authorized representative who has indicated his/her understanding and acceptance.   Dental Advisory Given  Plan Discussed with: CRNA and Surgeon  Anesthesia Plan Comments:         Anesthesia Quick Evaluation

## 2012-05-11 NOTE — Progress Notes (Signed)
Assisted Dr. Albertina Parr with left, ultrasound guided, transabdominal plane block. Side rails up, monitors on throughout procedure. See vital signs in flow sheet. Tolerated Procedure well.

## 2012-05-12 ENCOUNTER — Other Ambulatory Visit: Payer: Self-pay | Admitting: Oncology

## 2012-05-12 ENCOUNTER — Encounter (HOSPITAL_BASED_OUTPATIENT_CLINIC_OR_DEPARTMENT_OTHER): Payer: Self-pay | Admitting: Surgery

## 2012-05-12 DIAGNOSIS — C180 Malignant neoplasm of cecum: Secondary | ICD-10-CM

## 2012-05-12 DIAGNOSIS — C801 Malignant (primary) neoplasm, unspecified: Secondary | ICD-10-CM

## 2012-05-17 ENCOUNTER — Telehealth (INDEPENDENT_AMBULATORY_CARE_PROVIDER_SITE_OTHER): Payer: Self-pay | Admitting: General Surgery

## 2012-05-17 ENCOUNTER — Ambulatory Visit: Payer: Self-pay | Admitting: Radiation Oncology

## 2012-05-17 NOTE — Telephone Encounter (Signed)
Pt calling for refill of pain meds; he is allergic to codeine and cannot take Vicodin.  Dr. Barry Dienes signed for additional Percocet 5/325 mg, # 30, 1-2 Q 4-6 H prn pain, no refill.  Pt instructed this is his only refill.  Also gave him his postop appt.  Pt will pick up script at front desk.  He understands all.

## 2012-05-21 ENCOUNTER — Telehealth: Payer: Self-pay | Admitting: Oncology

## 2012-05-21 ENCOUNTER — Ambulatory Visit (HOSPITAL_BASED_OUTPATIENT_CLINIC_OR_DEPARTMENT_OTHER): Payer: Self-pay | Admitting: Oncology

## 2012-05-21 ENCOUNTER — Other Ambulatory Visit: Payer: Self-pay | Admitting: *Deleted

## 2012-05-21 VITALS — BP 143/81 | HR 76 | Temp 97.4°F | Resp 20 | Ht 71.0 in | Wt 143.1 lb

## 2012-05-21 DIAGNOSIS — N529 Male erectile dysfunction, unspecified: Secondary | ICD-10-CM

## 2012-05-21 DIAGNOSIS — R197 Diarrhea, unspecified: Secondary | ICD-10-CM

## 2012-05-21 DIAGNOSIS — C210 Malignant neoplasm of anus, unspecified: Secondary | ICD-10-CM

## 2012-05-21 DIAGNOSIS — R109 Unspecified abdominal pain: Secondary | ICD-10-CM

## 2012-05-21 MED ORDER — OXYCODONE-ACETAMINOPHEN 5-325 MG PO TABS: 1.0000 | ORAL_TABLET | ORAL | Status: DC | PRN

## 2012-05-21 NOTE — Telephone Encounter (Signed)
gve the pt his dec 2013 appt calendar. Was unable to schedule the pt's appt with the urologist due to his insurance which was the orange card which their practice did not accept. Pt understood and he will try his primary care office.

## 2012-05-21 NOTE — Progress Notes (Signed)
   [facility]    OFFICE PROGRESS NOTE   INTERVAL HISTORY:   He underwent an evaluation by Dr. Hilarie Fredrickson for abdominal pain and diarrhea. He was diagnosed with lymphocytic colitis and is taking budesonide. He reports the symptoms are much improved. He underwent a left inguinal hernia repair by Dr. Harlow Asa on 05/11/2012.  Mr. Hazeline Junker continues to have pain at the surgical site. He also reports intermittent pain with bowel movements. He complains of inability to get an erection. Objective:  Vital signs in last 24 hours:  Blood pressure 143/81, pulse 76, temperature 97.4 F (36.3 C), resp. rate 20, height 5' 11"  (1.803 m), weight 143 lb 1.6 oz (64.91 kg).    HEENT: Neck without mass Resp: Lungs clear bilaterally Cardio: Regular rate and rhythm GI: The abdomen is nontender, no hepatomegaly, healing incision with Steri-Strips in place at the left inguinal region. Mild surrounding swelling. No evidence of infection Rectal: Radiation hyperpigmentation at the groin and perineum with almost complete healing of the areas of skin breakdown. No evidence of tumor at the perineum or anal verge     Medications: I have reviewed the patient's current medications.  Assessment/Plan: 1. 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 He began radiation 11/19/2011. He completed cycle 1 5-fluorouracil/mitomycin C. beginning 11/17/2011 and cycle 2 12/15/2011. He completed radiation on 01/08/2012. 2. Insomnia. He continues Valium at bedtime. 3. Tobacco use. 4. Degenerative disc disease. 5. History of mild thrombocytopenia secondary to chemotherapy. 6. Skin breakdown at the groin and perineum secondary to radiation/chemotherapy. Resolved. 7. Left inguinal hernia,, status post surgical repair 05/11/2012 8. Abdominal pain and diarrhea of unclear etiology. He was referred to Dr. Hilarie Fredrickson and it diagnosed  with lymphocytic colitis. He is completing a course of budesonide and reports significant clinical improvement. 9. Erectile dysfunction-we made a urology referral today   Disposition:  He remains in clinical remission from anal cancer. He will return for an office visit in 3 months. He was given a refill for 30 Percocet tablets. I encouraged him to wean off of the Percocet. We do not plan to refill this again.   Betsy Coder, MD  05/21/2012  9:58 AM

## 2012-05-21 NOTE — Patient Instructions (Signed)
[facility] Discharge Instructions  Your exam findings, labs and results were discussed with your MD today.   Please visit scheduling to obtain calendar for future appointments.  Please call the [facility] at [phone] during business hours should you have any further questions or need assistance in obtaining follow-up care. If you have a medical emergency, please dial 911.  Special Instructions:

## 2012-05-25 ENCOUNTER — Encounter: Payer: Self-pay | Admitting: Family Medicine

## 2012-05-25 ENCOUNTER — Ambulatory Visit (INDEPENDENT_AMBULATORY_CARE_PROVIDER_SITE_OTHER): Payer: Self-pay | Admitting: Family Medicine

## 2012-05-25 VITALS — BP 153/90 | HR 91 | Temp 97.8°F | Ht 71.0 in | Wt 139.0 lb

## 2012-05-25 DIAGNOSIS — F17200 Nicotine dependence, unspecified, uncomplicated: Secondary | ICD-10-CM

## 2012-05-25 DIAGNOSIS — J069 Acute upper respiratory infection, unspecified: Secondary | ICD-10-CM | POA: Insufficient documentation

## 2012-05-25 DIAGNOSIS — Z720 Tobacco use: Secondary | ICD-10-CM

## 2012-05-25 MED ORDER — BENZONATATE 200 MG PO CAPS: 200.0000 mg | ORAL_CAPSULE | Freq: Two times a day (BID) | ORAL | Status: DC | PRN

## 2012-05-25 NOTE — Patient Instructions (Signed)
I am sorry you are not feeling well today. I believe you have an upper respiratory infection/viral illness. The Percocet you are taking has tylenol in it and that may help with body aches. I would avoid medicines like ibuprofen since you have been instructed not to use these by your other doctors. I would come back in on Friday if you are not continuing to feel better. Get a thermometer so you can check your temperature at home and let us know if you have any fevers above 101. I have sent in some Tessalon pearls which can help with your cough. Id pick up some cough drops at the store as well.

## 2012-05-26 ENCOUNTER — Ambulatory Visit: Payer: Self-pay | Admitting: Internal Medicine

## 2012-05-26 DIAGNOSIS — Z716 Tobacco abuse counseling: Secondary | ICD-10-CM | POA: Insufficient documentation

## 2012-05-26 NOTE — Assessment & Plan Note (Signed)
Symptomatic treatment per discussion and AVS. Patient to return if not improving. FOr body aches-cannot offer much as patient already on percocet and has been told not to take NSAIDs. DId prescribe tessalon pearls as cough is agitating his surgical site due to pain.

## 2012-05-26 NOTE — Progress Notes (Signed)
  Subjective:    Patient ID: Martin [name], male    DOB: January 02, 1961, 51 y.o.   MRN: [id number]  HPI Same day visit for cold symptoms  URI symptoms-Cough and congestion and runny nose for 4 days now. Patient reports overall he has been somewhat weak and fatigued since his hernia surgery but that his fatigue became more severe over the weekend. On Saturday and Sunday he had friends over and he could barely sit up to spend time with them. Night sweats 2 nights ago but patient does not know if he had a fever. On ROS patient also complained of having loose stools 2 days ago as well but not recurrent. Main complaints are his body aches and cough.   Tobacco abuse-not ready to quit. Has cut down to 5-6 cigarettes from 2 PPD.  Review of Systems -See HPI  Past Medical History-smoking status noted: current smoker 5-6 cigarettes per day. Recent inguinal hernia repair on left side on 05/11/12. History of anal cancer treated with surgery, radiation and chemo. Patient not immunocompromised based off of [facility].   Reviewed problem list.  Medications- reviewed and updated Chief complaint-noted    Objective:   Physical Exam  Constitutional: He is oriented to person, place, and time. No distress.       Thin male appears older than stated age.   HENT:  Mouth/Throat: Oropharynx is clear and moist.       Erythematous turbinates. Normal tympanic membranes.No lymphadenopathy. Erythematous pharynx without exudate on tonsils.    Eyes: Conjunctivae normal and EOM are normal. Right eye exhibits no discharge. Left eye exhibits no discharge.  Neck: Normal range of motion. Neck supple.  Cardiovascular: Normal rate and regular rhythm.  Exam reveals no gallop.   No murmur heard. Pulmonary/Chest: Effort normal and breath sounds normal. He has no wheezes. He has no rales.  Abdominal: Soft. Bowel sounds are normal. There is no tenderness. There is no rebound.       Inguinal hernia site healing well without signs of infection.     Musculoskeletal: Normal range of motion. He exhibits no edema.  Lymphadenopathy:    He has no cervical adenopathy.  Neurological: He is alert and oriented to person, place, and time.  Skin: He is not diaphoretic.        Assessment & Plan:

## 2012-05-26 NOTE — Assessment & Plan Note (Signed)
Encouraged cessation, patient not ready to quit. Praised efforts to cut down as patient now at 5 cigarettes per day down from 2 PPD>

## 2012-06-02 ENCOUNTER — Encounter (INDEPENDENT_AMBULATORY_CARE_PROVIDER_SITE_OTHER): Payer: Self-pay | Admitting: Surgery

## 2012-06-02 ENCOUNTER — Ambulatory Visit (INDEPENDENT_AMBULATORY_CARE_PROVIDER_SITE_OTHER): Payer: PRIVATE HEALTH INSURANCE | Admitting: Surgery

## 2012-06-02 VITALS — BP 118/94 | HR 68 | Temp 98.3°F | Resp 16 | Ht 71.0 in | Wt 143.0 lb

## 2012-06-02 DIAGNOSIS — K4090 Unilateral inguinal hernia, without obstruction or gangrene, not specified as recurrent: Secondary | ICD-10-CM

## 2012-06-02 NOTE — Progress Notes (Signed)
General Surgery [facility]  Visit Diagnoses: 1. Inguinal hernia     HISTORY: The patient returns for his first postoperative visit 3 weeks following left inguinal hernia repair with mesh. Postoperative course has been largely uneventful. He continues to complain of pain in the left groin.  EXAM: Examination shows a healing surgical incision with mild soft tissue swelling. No sign of infection. With Valsalva and cough there is no sign of recurrence.  IMPRESSION: Status post left inguinal hernia repair with mesh, no complications  PLAN: The patient's prescription for oxycodone is renewed with 30 tablets only. Patient will begin applying topical creams to his incisions. He will followup with gastroenterology and with medical oncology.  Patient will return for followup in this office as needed.  Earnstine Regal, MD, [facility]

## 2012-06-02 NOTE — Patient Instructions (Signed)
  COCOA BUTTER & VITAMIN E CREAM  ([employer] or other brand)  Apply cocoa butter/vitamin E cream to your incision 2 - 3 times daily.  Massage cream into incision for one minute with each application.  Use sunscreen (50 SPF or higher) for first 6 months after surgery if area is exposed to sun.  You may substitute Mederma or other scar reducing creams as desired.   

## 2012-06-03 ENCOUNTER — Other Ambulatory Visit: Payer: Self-pay | Admitting: *Deleted

## 2012-06-03 ENCOUNTER — Other Ambulatory Visit: Payer: Self-pay | Admitting: Internal Medicine

## 2012-06-03 ENCOUNTER — Other Ambulatory Visit (INDEPENDENT_AMBULATORY_CARE_PROVIDER_SITE_OTHER): Payer: Self-pay | Admitting: Surgery

## 2012-06-03 DIAGNOSIS — R110 Nausea: Secondary | ICD-10-CM

## 2012-06-03 DIAGNOSIS — Z85048 Personal history of other malignant neoplasm of rectum, rectosigmoid junction, and anus: Secondary | ICD-10-CM

## 2012-06-03 DIAGNOSIS — R197 Diarrhea, unspecified: Secondary | ICD-10-CM

## 2012-06-03 DIAGNOSIS — R1110 Vomiting, unspecified: Secondary | ICD-10-CM

## 2012-06-03 MED ORDER — PROMETHAZINE HCL 12.5 MG PO TABS: 12.5000 mg | ORAL_TABLET | Freq: Four times a day (QID) | ORAL | Status: DC | PRN

## 2012-06-03 NOTE — Telephone Encounter (Signed)
Spoke to pt told him that I will leave some samples of Entecort for him at our front desk, he said he will be by today to pick them up.

## 2012-06-07 ENCOUNTER — Encounter: Payer: Self-pay | Admitting: Radiation Oncology

## 2012-06-07 ENCOUNTER — Ambulatory Visit
Admission: RE | Admit: 2012-06-07 | Discharge: 2012-06-07 | Disposition: A | Discharge status: Home / Self Care | Payer: No Typology Code available for payment source | Source: Ambulatory Visit | Attending: Radiation Oncology | Admitting: Radiation Oncology

## 2012-06-07 VITALS — BP 141/90 | HR 82 | Temp 98.2°F | Wt 146.7 lb

## 2012-06-07 DIAGNOSIS — C210 Malignant neoplasm of anus, unspecified: Secondary | ICD-10-CM

## 2012-06-07 HISTORY — DX: Reserved for inherently not codable concepts without codable children: IMO0001

## 2012-06-07 HISTORY — DX: Reserved for concepts with insufficient information to code with codable children: IMO0002

## 2012-06-07 MED ORDER — OXYCODONE-ACETAMINOPHEN 5-325 MG PO TABS: 1.0000 | ORAL_TABLET | ORAL | Status: DC | PRN

## 2012-06-07 NOTE — Progress Notes (Signed)
Radiation Oncology         [phone] ________________________________  Name: Martin [name] MRN: [id number]  Date: 06/07/2012  DOB: [birth year]  Follow-Up Visit Note  CC: Martin Netters, MD  Martin Regal, MD  Diagnosis:   Anal carcinoma  Interval Since Last Radiation:  5 months  Narrative:  The patient returns today for routine follow-up.  He continues to have pain in the pelvis region. he did undergo a left inguinal hernia repair recently.  Patient was also diagnosed with lymphocytic colitis. He was having diarrhea related to this issue.  With Entocort this issue is much better. He denies any rectal bleeding.                              ALLERGIES:  is allergic to aspirin; codeine; and morphine and related.  Meds: Current Outpatient Prescriptions  Medication Sig Dispense Refill  . benzonatate (TESSALON) 200 MG capsule Take 1 capsule (200 mg total) by mouth 2 (two) times daily as needed for cough.  20 capsule  0  . budesonide (ENTOCORT EC) 3 MG 24 hr capsule Take 3 capsules by mouth each morning x 8 weeks.  168 capsule  0  . diazepam (VALIUM) 5 MG tablet TAKE 1 TABLET BY MOUTH AT BEDTIME AS NEEDED  30 tablet  0  . emollient (BIAFINE) cream Apply 170 g topically 2 (two) times daily as needed. Radiation burns      . folic acid (FOLVITE) 1 MG tablet Take 1 tablet (1 mg total) by mouth daily.  30 tablet  6  . loperamide (IMODIUM) 2 MG capsule Take 4 mg by mouth 4 (four) times daily as needed. For diarrhea or loose stools      . oxyCODONE-acetaminophen (PERCOCET/ROXICET) 5-325 MG per tablet Take 1 tablet by mouth every 4 (four) hours as needed.  30 tablet  0  . pantoprazole (PROTONIX) 40 MG tablet Take 1 tablet (40 mg total) by mouth daily.  90 tablet  3  . promethazine (PHENERGAN) 12.5 MG tablet Take 1 tablet (12.5 mg total) by mouth every 6 (six) hours as needed for nausea.  20 tablet  0  . vitamin B-12 (CYANOCOBALAMIN) 1000 MCG tablet Take 1 tablet (1,000 mcg total) by mouth  daily.  30 tablet  6  . DISCONTD: oxyCODONE-acetaminophen (PERCOCET/ROXICET) 5-325 MG per tablet Take 1 tablet by mouth every 4 (four) hours as needed.      . hyoscyamine (LEVSIN/SL) 0.125 MG SL tablet Place 1 tablet (0.125 mg total) under the tongue every 4 (four) hours as needed for cramping.  30 tablet  0  . oxyCODONE-acetaminophen (PERCOCET) 5-325 MG per tablet Take 1 tablet by mouth every 4 (four) hours as needed for pain.  20 tablet  0  . oxyCODONE-acetaminophen (ROXICET) 5-325 MG per tablet Take 1 tablet by mouth every 4 (four) hours as needed for pain.  30 tablet  0  . promethazine (PHENERGAN) 12.5 MG tablet Take 1 tablet (12.5 mg total) by mouth every 6 (six) hours as needed for nausea.  30 tablet  0  . psyllium (METAMUCIL) 0.52 G capsule Take 0.52 g by mouth 2 (two) times daily.      Marland Kitchen DISCONTD: oxyCODONE-acetaminophen (ROXICET) 5-325 MG per tablet Take 1 tablet by mouth every 4 (four) hours as needed for pain.  30 tablet  0    Physical Findings: The patient is in no acute distress. Patient is alert and oriented.  weight is 146 lb 11.2 oz (66.543 kg). His temperature is 98.2 F (36.8 C). His blood pressure is 141/90 and his pulse is 82. Marland Kitchen  No palpable cervical supraclavicular or axillary adenopathy. The lungs are clear to auscultation. The heart has regular rhythm and rate. The abdomen is soft and nontender with normal bowel sounds.  The inguinal areas are free of adenopathy. patient has hyperpigmentation changes in the pelvis region.  On rectal examination there is no signs of residual or recurrent tumor.  The exam continues to be quite uncomfortable for the patient.  Lab Findings: Lab Results  Component Value Date   WBC 8.6 04/21/2012   HGB 15.3 05/11/2012   HCT 40.1 04/21/2012   MCV 97.8 04/21/2012   PLT 163 04/21/2012    @LASTCHEM @  Radiographic Findings: No results found.  Impression:  The patient is recovering from the effects of radiation.  No evidence of tumor or recurrence  on exam today  Plan:  Routine followup in 3 months. Patient was given a limited prescription for Percocet and understands that he will need to start weaning off this medication.  _____________________________________    Blair Promise, PhD, MD

## 2012-06-07 NOTE — Progress Notes (Signed)
Patient here for routine follow up completion of anal cancer radiation on 01/08/2012.Continues to have abdominal pain ,intestinal inflammation and right pelvic pain.Takes entocort and percocet.Bouts of intermittent diarrhea.Weight improving from 139 to 146 lbs in last 2 weeks.

## 2012-06-14 ENCOUNTER — Other Ambulatory Visit: Payer: Self-pay | Admitting: Oncology

## 2012-06-14 ENCOUNTER — Telehealth (INDEPENDENT_AMBULATORY_CARE_PROVIDER_SITE_OTHER): Payer: Self-pay | Admitting: General Surgery

## 2012-06-14 DIAGNOSIS — C210 Malignant neoplasm of anus, unspecified: Secondary | ICD-10-CM

## 2012-06-14 NOTE — Telephone Encounter (Signed)
Dr. Benay Spice agrees to refill Valium for 2 mg, but will no longer refill this after today.

## 2012-06-14 NOTE — Telephone Encounter (Signed)
Patient called to request refill on Percocet. Per [facility] he received #30 on 9/25 by Dr. Harlow Asa, then #30 on 9/30 by Dr. Sondra Come. Dr. Benay Spice declines to refill pain medication any further. No need from oncology standpoint to continue pain med. Needs to follow up with his surgeon if pain continues. Called and left this message on patient's voice mail.

## 2012-06-14 NOTE — Telephone Encounter (Signed)
Martin [name] called wanting to call to inform us that patient called them for a refill for percocet after getting #30 from Dr Harlow Asa on 06/02/12 and #30 from Dr Sondra Come on 06/07/12. Wanted to make [place] aware.

## 2012-06-15 ENCOUNTER — Encounter (INDEPENDENT_AMBULATORY_CARE_PROVIDER_SITE_OTHER): Payer: Self-pay

## 2012-06-15 ENCOUNTER — Telehealth (INDEPENDENT_AMBULATORY_CARE_PROVIDER_SITE_OTHER): Payer: Self-pay | Admitting: General Surgery

## 2012-06-15 NOTE — Telephone Encounter (Addendum)
Per Dr Harlow Asa he can return to work 06/21/12 with no restrictions. No more pain medicine will be prescribed. I called patient back but he was not home. Left message with family member to have him call back and we would take care of the note then.

## 2012-06-15 NOTE — Telephone Encounter (Signed)
Patient called re: refill of his pain medicine. He is requesting a refill of his percocet 5/325 he states he is still having pain at hernia site, but it is getting better. He is also requesting to go back to work on 06/21/2012. He lifts up to 30-40 lbs at work as needed. He will be 6 weeks out from hernia repair on 06/22/2012. Please advise if okay to return to work at that time and patient will pick up return to work note in office. You can reach patient back at [phone].

## 2012-06-21 ENCOUNTER — Encounter: Payer: Self-pay | Admitting: Internal Medicine

## 2012-06-22 ENCOUNTER — Encounter: Payer: Self-pay | Admitting: Internal Medicine

## 2012-06-22 ENCOUNTER — Ambulatory Visit (INDEPENDENT_AMBULATORY_CARE_PROVIDER_SITE_OTHER): Payer: Self-pay | Admitting: Internal Medicine

## 2012-06-22 VITALS — BP 120/70 | HR 72 | Ht 68.5 in | Wt 148.0 lb

## 2012-06-22 DIAGNOSIS — K5289 Other specified noninfective gastroenteritis and colitis: Secondary | ICD-10-CM

## 2012-06-22 DIAGNOSIS — K52832 Lymphocytic colitis: Secondary | ICD-10-CM

## 2012-06-22 DIAGNOSIS — K219 Gastro-esophageal reflux disease without esophagitis: Secondary | ICD-10-CM

## 2012-06-22 MED ORDER — BISMUTH SUBSALICYLATE 262 MG PO CHEW: 524.0000 mg | CHEWABLE_TABLET | Freq: Three times a day (TID) | ORAL | Status: DC

## 2012-06-22 MED ORDER — OXYCODONE-ACETAMINOPHEN 5-325 MG PO TABS: 1.0000 | ORAL_TABLET | ORAL | Status: DC | PRN

## 2012-06-22 MED ORDER — BUDESONIDE 3 MG PO CP24: 6.0000 mg | ORAL_CAPSULE | ORAL | Status: DC

## 2012-06-22 NOTE — Progress Notes (Signed)
Subjective:    Patient ID: Martin [name], male    DOB: Nov 05, 1960, 51 y.o.   MRN: [id number]  HPI Martin [name] is a 51 year old male with PMH of anal cancer status post transanal excision, chemotherapy with radiation, hypertension, GERD, degenerative joint disease, and new diagnosis of lymphocytic colitis who seen in followup. He is alone today. The patient underwent upper and lower endoscopy on 04/27/2012. The EGD revealed mild reflux esophagitis, mild gastritis, small gastric fundus diverticulum, and a normal duodenum. Biopsies were negative for H. pylori, dysplasia or metaplasia. Colonoscopy revealed a 3 mm hyperplastic polyp in the rectosigmoid colon, mild sigmoid diverticulosis and mild erythema with random biopsies diagnostic for lymphocytic colitis. He was initially treated with budesonide and was given samples at 9 mg a day. He took this medication but when it came time to have it filled at the pharmacy it was prohibitively expensive. He reports a good response both from his diarrhea and lower abdominal cramping to the budesonide. Once it ran out, he continues to report diarrhea and lower abdominal cramping. His lower abdominal cramping is relieved by bowel movement, but is one of his most troublesome complaints. He is now having random bouts of diarrhea which do awaken him from sleep. Some days he has less diarrhea than others. He is using the loperamide 4 mg up to 4 times daily which seems to help some but does not completely relieve his symptoms or his lower abdominal cramping. He's had no benefit from the Levsin. He has continued on Protonix and is without dysphagia, heartburn, nausea or vomiting.  He did undergo an inguinal hernia repair recently and has recovered well from this.  Review of Systems As per history of present illness, otherwise negative  Current Medications, Allergies, Past Medical History, Past Surgical History, Family History and Social History were reviewed in [employer] record.     Objective:   Physical Exam BP 120/70  Pulse 72  Ht 5' 8.5" (1.74 m)  Wt 148 lb (67.132 kg)  BMI 22.18 kg/m2 Constitutional: Well-developed and well-nourished. No distress. HEENT: Normocephalic and atraumatic. Oropharynx is clear and moist. No oropharyngeal exudate. Poor dentition. Conjunctivae are normal.  No scleral icterus.. Cardiovascular: Normal rate, regular rhythm and intact distal pulses. No M/R/G Pulmonary/chest: Effort normal and breath sounds normal. No wheezing, rales or rhonchi. Abdominal: Soft, nontender, nondistended. Bowel sounds active throughout. There are no masses palpable.  Extremities: no clubbing, cyanosis, or edema Neurological: Alert and oriented to person place and time. Skin: Skin is warm and dry. No rashes noted. Psychiatric: Normal mood and affect. Behavior is normal.     Assessment & Plan:  51 year old male with PMH of anal cancer status post transanal excision, chemotherapy with radiation, hypertension, GERD, degenerative joint disease, and new diagnosis of lymphocytic colitis who seen in followup.   1.  Lymphocytic colitis -- his diarrhea and lower abdominal cramping are felt all secondary to lymphocytic colitis. Unfortunately, due to the cost of budesonide, this has been inadequately treated to this point. He has been using oxycodone for his lower, cramping and we have discussed this at length today. I do not want him to use oxycodone on ongoing or chronic basis for GI related complaints including lymphocytic colitis and diarrhea.  He is aware that we'll not continue to refill this medication for this purpose. Unfortunately, there are no assistance programs to help him with budesonide and he is unable to afford this medication. He is given 18 days of samples today,  and I would like for him to take budesonide 6 mg daily for 18 days and then switched to bismuth subsalicylate.  Bismuth subsalicylate has been proven to be  effective in microscopic colitis, though likely not as effective as budesonide.  I would like him to start bismuth subsalicylate 825 mg 2 tablets 3 times a day for 8 weeks. I will give him a very small supply of oxycodone to help with his lower abdominal pain for the next several days until the budesonide can start to be effective. He is aware that I will not provide additional oxycodone for this issue in the future. I'll see him back in 8 weeks to judge his response to this therapy. He can continue loperamide as needed.  2.  GERD/mild esophagitis -- symptomatically he is improved on pantoprazole 40 mg a day. He will continue this dose for now.

## 2012-06-22 NOTE — Patient Instructions (Addendum)
Take your entocort until gone.  Take two tabs daily

## 2012-07-02 ENCOUNTER — Other Ambulatory Visit: Payer: Self-pay | Admitting: Oncology

## 2012-07-08 ENCOUNTER — Other Ambulatory Visit: Payer: Self-pay | Admitting: Oncology

## 2012-07-08 DIAGNOSIS — C210 Malignant neoplasm of anus, unspecified: Secondary | ICD-10-CM

## 2012-07-09 ENCOUNTER — Other Ambulatory Visit: Payer: Self-pay | Admitting: Oncology

## 2012-07-12 ENCOUNTER — Other Ambulatory Visit: Payer: Self-pay | Admitting: Internal Medicine

## 2012-08-10 ENCOUNTER — Encounter: Payer: Self-pay | Admitting: Family Medicine

## 2012-08-10 ENCOUNTER — Ambulatory Visit (INDEPENDENT_AMBULATORY_CARE_PROVIDER_SITE_OTHER): Payer: Self-pay | Admitting: Family Medicine

## 2012-08-10 ENCOUNTER — Encounter: Payer: Self-pay | Admitting: Home Health Services

## 2012-08-10 VITALS — BP 150/84 | HR 94 | Temp 98.2°F | Ht 68.5 in | Wt 145.0 lb

## 2012-08-10 DIAGNOSIS — J069 Acute upper respiratory infection, unspecified: Secondary | ICD-10-CM

## 2012-08-10 DIAGNOSIS — Z7189 Other specified counseling: Secondary | ICD-10-CM

## 2012-08-10 DIAGNOSIS — Z716 Tobacco abuse counseling: Secondary | ICD-10-CM

## 2012-08-10 DIAGNOSIS — F17200 Nicotine dependence, unspecified, uncomplicated: Secondary | ICD-10-CM

## 2012-08-10 NOTE — Assessment & Plan Note (Addendum)
Viral illness/URI/flu like illness. Symptomatic treatment per AVS. Patient should take tylenol only (possibly advised no NSAIDs given lymphocytic colitis but unclear). Patient requested Percocet for body aches but expressed to patient this is not appropriate and would need him to follow up with PCP for chronic pain (we have never prescribed chronic narcotics this has always been through GI or oncology but it is unclear if patient has continued need for medication-his reasoning is low back pain). Patient should get flu shot after symptoms resolve. Given no fever, unlikely the flu. No flu POCT available in clinic. Patient to follow up if symptoms persist, suspect they will improve as they did at last visit in September. If night sweats continue, consider TB skin test.

## 2012-08-10 NOTE — Patient Instructions (Addendum)
Please get plenty of rest to get over your flu-like symptoms.  -Take tylenol for any pain and fever.  -Stay well hydrated with water.   -Follow up in 1 week to receive the flu vaccine. -follow up on Thursday or Friday of this week if your symptoms worsen or persist  Please make an appointment with Dr. Ardelia Mems to evaluate your low back and leg pain.

## 2012-08-10 NOTE — Assessment & Plan Note (Signed)
Encouraged cessation. Patient not ready to quit but remains at reduced # of cigarettes. Plans to f/u with PCP.

## 2012-08-10 NOTE — Progress Notes (Signed)
Subjective:     Patient ID: Martin [name], male   DOB: 02-Apr-1961, 51 y.o.   MRN: [id number]  HPI  1. Body aches and fatigue: He has a 2 day history of body aches and fatigue. He was around his daughter that had similar symptoms. He wasn't able to sleep last night due to discomfort.  He kept having alternating bouts of sweating and then feeling chilly. He did not take his temperature due to not having a thermometer. He does not have an appetite. He's been taking Aleve to help with the pain. He has not had any nausea or vomiting. He has had diarrhea but this seems to be a chronic condition.   2. Refill of percocet for pain relief: He has chronic pain that stems from his cancer and cancer treatment.  Review of Systems Within normal limits other than in HPI      Objective:   Physical Exam  General: no acute distress,  Neuro: oriented x3, aware HEENT: PERRLA, EOMI, NCAT, Tympanic membranes translucent and intact. No lymphadenopathy, no sore throat, mucus membranes moist  Cardio: S1S2, RRR, no murmurs,  Respiratory: CTA, no rhonchi, rales, wheezes;  Ab: +BS, no rebound, guarding or rigidity, no organomegaly  Extremites: +2 pulses in upper and lower extremities.     Assessment:    Viral infection     Plan:     1. Patient was advised to stay well hydrated with water. Pain relief with tylenol since he has been previously advised to avoid NSAIDS and was taking Aleve for pain relief.  A f/u should be scheduled if symptoms don't get better by Friday or worsen.  2. Patient was told to schedule an appointment with his PCP, Dr. Ardelia Mems, to discuss a refill of his chronic pain medication, Percocet.      PGY2 addendum Subjective:   1. Chills, sweats, body aches, fatigue for 2 days. Associated with cough and runny nose. No recorded fevers but has felt warm. No thermometer at home. Fatigue and body aches are worst symptoms for patient. Reported previously could not take NSAIDs but has been taking  aleve. States the Percocet would be more helpful for his body pain (had been on previously when treated for anal cancer). Has not gotten flu shot this year  2. Smoker-smoking about 5-6 cigarettes per day but impressed that he isnt at 2 ppd anymore. Patient not interested in quitting.   ROS--See HPI  Past Medical History-smoking status noted: active smoker.  Reviewed problem list.  Medications- reviewed and updated Chief complaint-noted  Objective: BP 150/84  Pulse 94  Temp 98.2 F (36.8 C) (Oral)  Ht 5' 8.5" (1.74 m)  Wt 145 lb (65.772 kg)  BMI 21.73 kg/m2 Gen: NAD HEENT: PERRLA, normal TM, no lymphadenopathy, no erythema of pharynx. MMM CV: RRR no mrg Lungs: CTAB, no increased respiratory effort  Assessment/Plan: See problem oriented charted

## 2012-08-12 ENCOUNTER — Ambulatory Visit (INDEPENDENT_AMBULATORY_CARE_PROVIDER_SITE_OTHER): Payer: No Typology Code available for payment source | Admitting: Family Medicine

## 2012-08-12 ENCOUNTER — Encounter: Payer: Self-pay | Admitting: Family Medicine

## 2012-08-12 VITALS — BP 143/85 | HR 81 | Temp 98.0°F | Ht 68.5 in | Wt 147.8 lb

## 2012-08-12 DIAGNOSIS — R61 Generalized hyperhidrosis: Secondary | ICD-10-CM | POA: Insufficient documentation

## 2012-08-12 DIAGNOSIS — J069 Acute upper respiratory infection, unspecified: Secondary | ICD-10-CM

## 2012-08-12 DIAGNOSIS — M549 Dorsalgia, unspecified: Secondary | ICD-10-CM

## 2012-08-12 LAB — CBC WITH DIFFERENTIAL/PLATELET
HCT: 40.5 % (ref 39.0–52.0)
Hemoglobin: 14.2 g/dL (ref 13.0–17.0)
Lymphs Abs: 1.5 10*3/uL (ref 0.7–4.0)
MCH: 33.7 pg (ref 26.0–34.0)
Monocytes Relative: 6 % (ref 3–12)
Neutro Abs: 5.8 10*3/uL (ref 1.7–7.7)
Neutrophils Relative %: 73 % (ref 43–77)
RBC: 4.21 MIL/uL — ABNORMAL LOW (ref 4.22–5.81)

## 2012-08-12 LAB — POCT URINALYSIS DIPSTICK
Bilirubin, UA: NEGATIVE
Ketones, UA: NEGATIVE
Leukocytes, UA: NEGATIVE

## 2012-08-12 NOTE — Patient Instructions (Addendum)
It was great to see you again today. I'm sorry you are not feeling well.  For your sweats and chills, I recommend that you get a thermometer to measure your temperature.  If you have fevers >100.4, let us know. You can use tylenol for fevers and pain, but be sure not to take more than 3557m total from all sources in 24 hours, as more than this can damage your liver. (Your percocet pills have acetaminophen/tylenol in them).  For your back pain, I am referring you to physical therapy. Let's see if this helps with your pain.  If you begin to feel worse, are unable to eat and drink, or have any other concerns, come back for another clinic appointment.

## 2012-08-15 ENCOUNTER — Encounter: Payer: Self-pay | Admitting: Family Medicine

## 2012-08-16 NOTE — Progress Notes (Signed)
Patient ID: Martin [name], male   DOB: 1960/11/15, 51 y.o.   MRN: [id number]  HPI: Martin [name] is a 51 y.o. male here with two chief complaints: sweats/chills and back pain.  Pt reports sweats and chills for 4 days. He has been coughing and sneezing. The sweats/chills occur at night. He has blown green mucous out of his nose. The cough has not produced blood. He reports that he is able to take PO and has not vomited. No rashes that he's noticed before today. Daughter has similar symptoms, and he visited her the day this all began. He has diarrhea, but this is chronically an issue for him. He has had a strange color in his urine (notes it to be green). No dysuria. Reports weight loss of 7 pounds. Has not checked his temp because he does not have a thermometer. He was seen in clinic on 12/3 for similar symptoms and told to f/u today or tomorrow if his symptoms were still present.  Pt was seen the other day in clinic by Dr. Yong Channel, who advised him to see me for management of pain. He has back and knee pain. The back pain shoots down his legs. He feels like the muscles on his side tense up. The pain hs been present for a few years but has worsened with the coughing he's had recently. He just started a new job that involves a lot of lifting. His legs began to hurt a few days ago. No problems with bowel control. For the pain he's tried tylenol, aleve, advil, BC powder, heating pads, ice, and putting a pillow between his legs to sleep. The pillow has helped.  He requests percocet today. I told him that I have reviewed his medical records and that there are multiple recent notes from different providers saying that he has called requesting percocet. He has a hx of anal cancer, but his cancer doctors do not feel that he should need percocet. He is accepting of me saying this, and says that each provider instructed him to get in touch with another provider, which he did, and that the trail eventually led him to  me. He does not get defensive during this discussion.  ROS: See HPI  PHYSICAL EXAM: BP 143/85  Pulse 81  Temp 98 F (36.7 C) (Oral)  Ht 5' 8.5" (1.74 m)  Wt 147 lb 12.8 oz (67.042 kg)  BMI 22.15 kg/m2 Gen: NAD HEENT: NCAT, R TM with some scarring, L TM clear, neither show erythema or bulging. No oral exudates. Heart: RRR Lungs: CTAB, normal respiratory effort Abd: nontender to palpation Back: mildly tender to palpation over spinous processes of lumbar spine. Ext: negative straight leg raise bilaterally. Normal strength in bilateral lower extremities. Neuro: nonfocal, speech intact. Equal sensation to light touch over bilateral lower extremities

## 2012-08-16 NOTE — Assessment & Plan Note (Signed)
Most likely musculoskeletal. No signs on exam to suggest need for urgent intervention (negative straight leg raise, able to control bowel/bladder, normal strength in lower extremities). Will refer to physical therapy, which he is willing to try. Recommended tylenol prn for pain. F/u prn.  I reviewed the  controlled substance database, and pt received a 30 day supply of percocet which he filled less than 2 weeks ago, and is now requesting more. Has received percocet from multiple providers in past and had these prescriptions filled at multiple pharmacies. Explained to him that I will not be giving him percocet. He seems accepting of this decision, and did not give much push back.

## 2012-08-16 NOTE — Assessment & Plan Note (Addendum)
Unclear etiology - possibly viral illness. Reports sweats/chills. No documented objective fevers here or at home. Instructed pt to get a thermometer. No signs of exam to suggest need for antibiotic therapy at this time. From reviewing our records, his weight has actually been stable. No hemoptysis to suggest need for TB testing, and this has gone on for less than 1 week. Would consider PPD if sweats persist.  Will check CBC and UA to r/o UTI as he reports "green urine".

## 2012-08-16 NOTE — Assessment & Plan Note (Signed)
No signs on exam to suggest need for antibiotics at this time. Likely viral. Advised tylenol for pain. See assessment & plan under "night sweats" for further details.

## 2012-08-18 ENCOUNTER — Other Ambulatory Visit: Payer: Self-pay | Admitting: Oncology

## 2012-08-20 ENCOUNTER — Ambulatory Visit: Payer: Self-pay | Admitting: Family Medicine

## 2012-08-23 ENCOUNTER — Ambulatory Visit (HOSPITAL_BASED_OUTPATIENT_CLINIC_OR_DEPARTMENT_OTHER): Payer: No Typology Code available for payment source | Admitting: Nurse Practitioner

## 2012-08-23 ENCOUNTER — Telehealth: Payer: Self-pay | Admitting: Internal Medicine

## 2012-08-23 VITALS — BP 148/98 | HR 100 | Temp 97.5°F | Resp 20 | Ht 68.5 in | Wt 151.9 lb

## 2012-08-23 DIAGNOSIS — R197 Diarrhea, unspecified: Secondary | ICD-10-CM

## 2012-08-23 DIAGNOSIS — K52832 Lymphocytic colitis: Secondary | ICD-10-CM

## 2012-08-23 DIAGNOSIS — C210 Malignant neoplasm of anus, unspecified: Secondary | ICD-10-CM

## 2012-08-23 DIAGNOSIS — R109 Unspecified abdominal pain: Secondary | ICD-10-CM

## 2012-08-23 NOTE — Progress Notes (Signed)
OFFICE PROGRESS NOTE  Interval history:  [name] returns as scheduled. He reports recurrent lower abdominal pain and is intermittently noting blood with bowel movements. He is taking BC powder and Aleve. He is having intermittent diarrhea. He was prescribed bismuth subsalicylate by Dr. Hilarie [name] for lymphocytic colitis. He is unable to tolerate this due to nausea. He continues to have pain at the left inguinal hernia repair site. He mainly notes the pain when he urinates.   Objective: Blood pressure 148/98, pulse 100, temperature 97.5 F (36.4 C), temperature source Oral, resp. rate 20, height 5' 8.5" (1.74 m), weight 151 lb 14.4 oz (68.901 kg).  Oropharynx is without thrush or ulceration. No palpable cervical, supraclavicular, axillary or inguinal lymph nodes. Lungs are clear. Regular cardiac rhythm. Abdomen is soft with generalized mild tenderness. Bowel sounds active. No hepatomegaly. Extremities are without edema. Radiation hyperpigmentation at the perineum. No nodularity/evidence of tumor at the perineum or anal verge.  Lab Results: Lab Results  Component Value Date   WBC 7.9 08/12/2012   HGB 14.2 08/12/2012   HCT 40.5 08/12/2012   MCV 96.2 08/12/2012   PLT 154 08/12/2012    Chemistry:    Chemistry      Component Value Date/Time   NA 140 04/21/2012 1139   K 4.4 04/21/2012 1139   CL 106 04/21/2012 1139   CO2 23 04/21/2012 1139   BUN 12 04/21/2012 1139   CREATININE 0.69 04/21/2012 1139      Component Value Date/Time   CALCIUM 9.5 04/21/2012 1139   ALKPHOS 67 04/21/2012 1139   AST 18 04/21/2012 1139   ALT 18 04/21/2012 1139   BILITOT 0.2* 04/21/2012 1139       Studies/Results: No results found.  Medications: I have reviewed the patient's current medications.  Assessment/Plan:  1. 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 He began radiation 11/19/2011. He completed cycle 1  5-fluorouracil/mitomycin C. beginning 11/17/2011 and cycle 2 12/15/2011. He completed radiation on 01/08/2012. 2. Insomnia. He has previously taken Valium. 3. Tobacco use. 4. Degenerative disc disease. 5. History of mild thrombocytopenia secondary to chemotherapy. 6. Skin breakdown at the groin and perineum secondary to radiation/chemotherapy. Resolved. 7. Left inguinal hernia. Status post surgical repair 05/11/2012. 8. Abdominal pain and diarrhea of unclear etiology. He was referred to Dr. Hilarie [name] and diagnosed with lymphocytic colitis. He initially took budesonide with clinical improvement. He was unable to afford the budesonide and was subsequently prescribed bismuth subsalicylate which he has been unable to tolerate. 9. Erectile dysfunction. A referral was made to urology following office visit 05/21/2012.  Disposition-[name] remains in clinical remission from anal cancer. He will return for a followup visit in 4 months.  We instructed him to contact Dr. Hilarie [name] regarding the abdominal pain and bleeding. He requested a prescription for pain medication until he can be seen by Dr. Hilarie [name]. We declined to give him a prescription citing the non-cancer etiology of his pain.  Plan reviewed with Dr. Benay Spice.  Ned Card ANP/GNP-BC

## 2012-08-23 NOTE — Telephone Encounter (Signed)
Gave pt appt calendar for Dr. Hilarie Fredrickson GI on 09/03/12, pt aware of appt then see Dr. Benay Spice on April 2014 , MD visit only

## 2012-08-30 ENCOUNTER — Ambulatory Visit
Admission: RE | Admit: 2012-08-30 | Payer: No Typology Code available for payment source | Source: Ambulatory Visit | Admitting: Radiation Oncology

## 2012-08-31 DIAGNOSIS — J189 Pneumonia, unspecified organism: Secondary | ICD-10-CM | POA: Insufficient documentation

## 2012-08-31 DIAGNOSIS — M1990 Unspecified osteoarthritis, unspecified site: Secondary | ICD-10-CM | POA: Insufficient documentation

## 2012-08-31 DIAGNOSIS — M47816 Spondylosis without myelopathy or radiculopathy, lumbar region: Secondary | ICD-10-CM | POA: Insufficient documentation

## 2012-08-31 DIAGNOSIS — F419 Anxiety disorder, unspecified: Secondary | ICD-10-CM | POA: Insufficient documentation

## 2012-08-31 DIAGNOSIS — K219 Gastro-esophageal reflux disease without esophagitis: Secondary | ICD-10-CM | POA: Insufficient documentation

## 2012-08-31 DIAGNOSIS — I10 Essential (primary) hypertension: Secondary | ICD-10-CM | POA: Insufficient documentation

## 2012-08-31 DIAGNOSIS — IMO0002 Reserved for concepts with insufficient information to code with codable children: Secondary | ICD-10-CM

## 2012-09-02 ENCOUNTER — Ambulatory Visit
Admission: RE | Admit: 2012-09-02 | Payer: No Typology Code available for payment source | Source: Ambulatory Visit | Admitting: Radiation Oncology

## 2012-09-02 ENCOUNTER — Encounter: Payer: Self-pay | Admitting: Internal Medicine

## 2012-09-03 ENCOUNTER — Ambulatory Visit: Payer: No Typology Code available for payment source | Admitting: Internal Medicine

## 2012-09-06 ENCOUNTER — Ambulatory Visit: Payer: No Typology Code available for payment source | Attending: Family Medicine

## 2012-09-30 ENCOUNTER — Ambulatory Visit
Admission: RE | Admit: 2012-09-30 | Discharge: 2012-09-30 | Disposition: A | Discharge status: Home / Self Care | Payer: No Typology Code available for payment source | Source: Ambulatory Visit | Attending: Radiation Oncology | Admitting: Radiation Oncology

## 2012-09-30 ENCOUNTER — Encounter: Payer: Self-pay | Admitting: Radiation Oncology

## 2012-09-30 VITALS — BP 151/82 | HR 87 | Temp 98.9°F | Resp 16 | Wt 152.7 lb

## 2012-09-30 DIAGNOSIS — C210 Malignant neoplasm of anus, unspecified: Secondary | ICD-10-CM

## 2012-09-30 NOTE — Progress Notes (Signed)
Radiation Oncology         [phone] ________________________________  Name: Martin [name] MRN: [id number]  Date: 09/30/2012  DOB: [birth year]  Follow-Up Visit Note  CC: Chrisandra Netters, MD  Earnstine Regal, MD  Diagnosis:   Anal carcinoma  Interval Since Last Radiation:  9 months  Narrative:  The patient returns today for routine follow-up.  He continues to have pain in the perirectal area particularly with bowel movements. The patient continues to have occasional episodes of diarrhea which seem to be less significant. Patient takes Imodium for this issue.  The patient was unable to complete a course of Entocort due to financial issues.  He occasionally will have episodes of bright red blood per rectum associated with diarrhea but this seems to be less frequent.  Patient is questioning narcotics for his pain and help with controlling his bowels. Per Dr. Vena Rua note the patient should not be on long term  narcotics to control his issues with lymphocytic colitis.  Patient continues to have erectile dysfunction but was unable to afford appointment with urology.                        ALLERGIES:  is allergic to aspirin; codeine; and morphine and related.  Meds: Current Outpatient Prescriptions  Medication Sig Dispense Refill  . loperamide (IMODIUM) 2 MG capsule Take 4 mg by mouth 4 (four) times daily as needed. For diarrhea or loose stools      . loperamide (IMODIUM) 2 MG capsule TAKE 2 CAPSULES BY MOUTH FOUR TIMES DAILY AS NEEDED FOR DIARRHEA OR LOOSE STOOLS.  60 capsule  0  . vitamin B-12 (CYANOCOBALAMIN) 1000 MCG tablet Take 1 tablet (1,000 mcg total) by mouth daily.  30 tablet  6  . bismuth subsalicylate (PEPTO BISMOL) 262 MG chewable tablet Chew 2 tablets (524 mg total) by mouth 3 (three) times daily.  30 tablet  0  . budesonide (ENTOCORT EC) 3 MG 24 hr capsule Take 2 capsules (6 mg total) by mouth every morning.  90 capsule  0  . diazepam (VALIUM) 2 MG tablet Take 1 tablet (2 mg  total) by mouth at bedtime as needed for sleep. NO FURTHER REFILLS BY DR. SHERRILL  30 tablet  0  . emollient (BIAFINE) cream Apply 170 g topically 2 (two) times daily as needed. Radiation burns      . folic acid (FOLVITE) 1 MG tablet Take 1 tablet (1 mg total) by mouth daily.  30 tablet  6  . hyoscyamine (LEVSIN/SL) 0.125 MG SL tablet Place 1 tablet (0.125 mg total) under the tongue every 4 (four) hours as needed for cramping.  30 tablet  0  . OVER THE COUNTER MEDICATION Vit E one every day , mg's unknown      . pantoprazole (PROTONIX) 40 MG tablet Take 1 tablet (40 mg total) by mouth daily.  90 tablet  3  . promethazine (PHENERGAN) 12.5 MG tablet Take 12.5 mg by mouth every 6 (six) hours as needed.        Physical Findings: The patient is in no acute distress. Patient is alert and oriented.  weight is 152 lb 11.2 oz (69.264 kg). His oral temperature is 98.9 F (37.2 C). His blood pressure is 151/82 and his pulse is 87. His respiration is 16. Marland Kitchen  No palpable supraclavicular or axillary adenopathy. The lungs are clear to auscultation. The heart has regular rhythm and rate. The abdomen is soft and nontender  with normal bowel sounds. There is no inguinal adenopathy. Patient has hyperpigmentation changes in the pelvis region. Patient is a normal uncircumcised male. There are no testicular masses noted. Examination of the perineum reveals hyperpigmentation changes. There are no visible lesions noted in the perirectal area. Digital exam is performed which is quite uncomfortable for the patient. There are no masses noted in the anus or lower rectum. Sphincter tone is good.  Lab Findings: Lab Results  Component Value Date   WBC 7.9 08/12/2012   HGB 14.2 08/12/2012   HCT 40.5 08/12/2012   MCV 96.2 08/12/2012   PLT 154 08/12/2012      Radiographic Findings: No results found.  Impression:  The patient is recovering from the effects of radiation.  No evidence for recurrence on clinical exam  today.  Plan:  Routine followup in 3 months.  If The patient is seen by Dr. Benay Spice this appointment can be pushed out to 6 months.  I've encouraged patient to continue close followup with Dr. Hilarie Fredrickson.  _____________________________________  -----------------------------------  Blair Promise, PhD, MD

## 2012-09-30 NOTE — Progress Notes (Signed)
Patient presents to the clinic today unaccompanied for follow up with Dr. Sondra Come. Patient alert and oriented to person, place, and time. No distress noted. Steady gait noted. Pleasant affect noted. Patient denies pain at this time. Patient reports low abdomen and pelvic pain associated with bowel movements.  However, patient reports intermittent episodes of diarrhea for which he take imodium. Patient reports the only other medication he is taking at this time is vitamin b12. Patient reports he can't afford the entocort. Also, patient reports that he is not taking protonix because he is out. Patient reports intermittent episodes of bright red blood in the stool. Patient denies nausea, vomiting, headache or dizziness. Patient has gained seven pounds since last being seen on 06/07/12. Reported all findings to Dr. Sondra Come.

## 2012-10-17 ENCOUNTER — Encounter (HOSPITAL_COMMUNITY): Payer: Self-pay | Admitting: Emergency Medicine

## 2012-10-17 ENCOUNTER — Emergency Department (INDEPENDENT_AMBULATORY_CARE_PROVIDER_SITE_OTHER)
Admission: EM | Admit: 2012-10-17 | Discharge: 2012-10-17 | Disposition: A | Discharge status: Home / Self Care | Payer: No Typology Code available for payment source | Source: Home / Self Care

## 2012-10-17 DIAGNOSIS — K029 Dental caries, unspecified: Secondary | ICD-10-CM

## 2012-10-17 DIAGNOSIS — K047 Periapical abscess without sinus: Secondary | ICD-10-CM

## 2012-10-17 MED ORDER — HYDROCODONE-ACETAMINOPHEN 7.5-325 MG PO TABS: 1.0000 | ORAL_TABLET | Freq: Four times a day (QID) | ORAL | Status: DC | PRN

## 2012-10-17 MED ORDER — AMOXICILLIN 500 MG PO CAPS: 500.0000 mg | ORAL_CAPSULE | Freq: Three times a day (TID) | ORAL | Status: DC

## 2012-10-17 NOTE — ED Notes (Signed)
Pt c/o tooth pain upper left with facial swelling. Pain and swelling started late Friday evening. Pt is having some nausea from the pain. Pt has taken tylenol and ibuprofen with no relief of pain.

## 2012-10-17 NOTE — ED Provider Notes (Signed)
History     CSN: [id number]  Arrival date & time 10/17/12  1140   First MD Initiated Contact with Patient 10/17/12 1143      Chief Complaint  Patient presents with  . Dental Pain    left upper tooth pain. and facial swelling.     (Consider location/radiation/quality/duration/timing/severity/associated sxs/prior treatment) HPI Comments: 52 year old man presents with left upper toothache. It is associated with left facial swelling for 2 days. He has very poor dentition several missing teeth and the remaining teeth are filled with multiple caries and denuded. Denies fever, chills or sore throat or earache.   Past Medical History  Diagnosis Date  . Inguinal hernia   . GERD (gastroesophageal reflux disease)   . Hypertension     EKG 12/12 EPIC   no  PCP- states increased lately but hasnt been diagnosed formally  . Pneumonia     as child, cough at present with no fever  . Hemorrhoid     internal  . 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 Allergy 2 2013  . Anxiety   . Arthritis     DDD lumbar, arthritis knees  . DJD (degenerative joint disease) of lumbar spine   . Radiation 11/19/11-01/08/12    5040 cGy 28 fx Pelvis and inguinal area    Past Surgical History  Procedure Laterality Date  . Transanal excision  10/14/2011    Dr.Todd Gerkin  . Surgical pathology   10/14/2011    squamous cell ca of anus  . Appendectomy      age 12 or 60  . Examination under anesthesia  10/14/2011    Procedure: EXAM UNDER ANESTHESIA;  Surgeon: Earnstine Regal, MD;  Location: [facility];  Service: General;  Laterality: N/A;  exam under anethesia, Excision of mass anal canal, 1.5cm  . Inguinal hernia repair  05/11/2012    Procedure: HERNIA REPAIR INGUINAL ADULT;  Surgeon: Earnstine Regal, MD;  Location: [place];  Service: General;  Laterality: Left;  left inguinal hernia repair with mesh    Family History  Problem Relation Age of Onset  . Colon cancer Father   .  Asthma Father   . Cancer Father     prostate  . Hypertension Father   . Asthma Mother   . Hypertension Mother   . Stomach cancer Neg Hx   . Esophageal cancer Neg Hx     History  Substance Use Topics  . Smoking status: Current Every Day Smoker -- 0.30 packs/day for 41 years    Types: Cigarettes  . Smokeless tobacco: Never Used     Comment: Cutting back  . Alcohol Use: 3.6 oz/week    6 Cans of beer per week     Comment: weekend    6pk weekend, beer       Review of Systems  All other systems reviewed and are negative.    Allergies  Aspirin; Codeine; and Morphine and related  Home Medications   Current Outpatient Rx  Name  Route  Sig  Dispense  Refill  . amoxicillin (AMOXIL) 500 MG capsule   Oral   Take 1 capsule (500 mg total) by mouth 3 (three) times daily. 1 capsule qid for 7 days   28 capsule   0   . bismuth subsalicylate (PEPTO BISMOL) 262 MG chewable tablet   Oral   Chew 2 tablets (524 mg total) by mouth 3 (three) times daily.  30 tablet   0   . budesonide (ENTOCORT EC) 3 MG 24 hr capsule   Oral   Take 2 capsules (6 mg total) by mouth every morning.   90 capsule   0     Take 2 tabs daily until samples are gone  Sample ...   . diazepam (VALIUM) 2 MG tablet   Oral   Take 1 tablet (2 mg total) by mouth at bedtime as needed for sleep. NO FURTHER REFILLS BY DR. SHERRILL   30 tablet   0   . emollient (BIAFINE) cream   Topical   Apply 170 g topically 2 (two) times daily as needed. Radiation burns         . folic acid (FOLVITE) 1 MG tablet   Oral   Take 1 tablet (1 mg total) by mouth daily.   30 tablet   6   . HYDROcodone-acetaminophen (NORCO) 7.5-325 MG per tablet   Oral   Take 1 tablet by mouth every 6 (six) hours as needed for pain.   20 tablet   0   . EXPIRED: hyoscyamine (LEVSIN/SL) 0.125 MG SL tablet   Sublingual   Place 1 tablet (0.125 mg total) under the tongue every 4 (four) hours as needed for cramping.   30 tablet   0   .  loperamide (IMODIUM) 2 MG capsule   Oral   Take 4 mg by mouth 4 (four) times daily as needed. For diarrhea or loose stools         . loperamide (IMODIUM) 2 MG capsule      TAKE 2 CAPSULES BY MOUTH FOUR TIMES DAILY AS NEEDED FOR DIARRHEA OR LOOSE STOOLS.   60 capsule   0   . OVER THE COUNTER MEDICATION      Vit E one every day , mg's unknown         . pantoprazole (PROTONIX) 40 MG tablet   Oral   Take 1 tablet (40 mg total) by mouth daily.   90 tablet   3   . promethazine (PHENERGAN) 12.5 MG tablet   Oral   Take 12.5 mg by mouth every 6 (six) hours as needed.         . vitamin B-12 (CYANOCOBALAMIN) 1000 MCG tablet   Oral   Take 1 tablet (1,000 mcg total) by mouth daily.   30 tablet   6     BP 145/85  Pulse 94  Temp(Src) 98.5 F (36.9 C) (Oral)  Resp 20  SpO2 100%  Physical Exam  Nursing note and vitals reviewed. Constitutional: He is oriented to person, place, and time. He appears well-developed and well-nourished. No distress.  HENT:  Bilateral TMs are normal Oropharynx without erythema, PND or exudates. Poor dentition with multiple caries and in animal erosions. Mild swelling of the upper left gingiva. Is associated with left facial swelling without erythema or tension.  Eyes: EOM are normal.  Neck: Normal range of motion. Neck supple.  Cardiovascular: Normal rate.   Pulmonary/Chest: Effort normal.  Lymphadenopathy:    He has no cervical adenopathy.  Neurological: He is alert and oriented to person, place, and time. He exhibits normal muscle tone.  Skin: Skin is warm.  Psychiatric: He has a normal mood and affect.    ED Course  Procedures (including critical care time)  Labs Reviewed - No data to display No results found.   1. Pain due to dental caries   2. Dental abscess  MDM   patient is discharged in stable condition no evidence of facial cellulitis or systemic infection. Amoxicillin 500 mg 4 times a day for 7 days Norco 7.5 mg  one by mouth every 6 hours when necessary #20 Strongly recommended that he call a dentist as soon as possible for additional management. Stop smoking        Janne Napoleon, NP 10/17/12 1254

## 2012-10-19 NOTE — ED Provider Notes (Signed)
Medical screening examination/treatment/procedure(s) were performed by resident physician or non-physician practitioner and as supervising physician I was immediately available for consultation/collaboration.   Pauline Good MD.   Billy Fischer, MD 10/19/12 [phone]

## 2012-11-25 ENCOUNTER — Other Ambulatory Visit (HOSPITAL_COMMUNITY): Payer: Self-pay | Admitting: Internal Medicine

## 2012-11-25 ENCOUNTER — Emergency Department (INDEPENDENT_AMBULATORY_CARE_PROVIDER_SITE_OTHER): Payer: Self-pay

## 2012-11-25 ENCOUNTER — Ambulatory Visit (HOSPITAL_COMMUNITY)
Admit: 2012-11-25 | Discharge: 2012-11-25 | Disposition: A | Discharge status: Home / Self Care | Payer: Self-pay | Attending: Internal Medicine | Admitting: Internal Medicine

## 2012-11-25 ENCOUNTER — Emergency Department (HOSPITAL_COMMUNITY)
Admission: EM | Admit: 2012-11-25 | Discharge: 2012-11-25 | Disposition: A | Discharge status: Nursing Home (Includes ICF) | Payer: Self-pay | Source: Home / Self Care | Attending: Emergency Medicine | Admitting: Emergency Medicine

## 2012-11-25 ENCOUNTER — Encounter (HOSPITAL_COMMUNITY): Payer: Self-pay

## 2012-11-25 ENCOUNTER — Emergency Department (HOSPITAL_COMMUNITY)
Admission: EM | Admit: 2012-11-25 | Discharge: 2012-11-25 | Disposition: A | Discharge status: Home / Self Care | Payer: Self-pay

## 2012-11-25 ENCOUNTER — Emergency Department (HOSPITAL_COMMUNITY): Payer: Self-pay

## 2012-11-25 DIAGNOSIS — M25569 Pain in unspecified knee: Secondary | ICD-10-CM

## 2012-11-25 DIAGNOSIS — M25561 Pain in right knee: Secondary | ICD-10-CM

## 2012-11-25 DIAGNOSIS — M79609 Pain in unspecified limb: Secondary | ICD-10-CM

## 2012-11-25 MED ORDER — KETOROLAC TROMETHAMINE 60 MG/2ML IM SOLN
30.0000 mg | Freq: Once | INTRAMUSCULAR | Status: AC
  Administered 2012-11-25: 30 mg via INTRAMUSCULAR

## 2012-11-25 MED ORDER — IBUPROFEN 600 MG PO TABS: 600.0000 mg | ORAL_TABLET | Freq: Three times a day (TID) | ORAL | Status: DC | PRN

## 2012-11-25 MED ORDER — KETOROLAC TROMETHAMINE 30 MG/ML IJ SOLN
INTRAMUSCULAR | Status: AC
  Filled 2012-11-25: qty 1

## 2012-11-25 NOTE — ED Notes (Signed)
Patient Demographics  Martin [name], is a 52 y.o. male  LFY:[id number]  ENI:[id number]  DOB - 12/04/1960  Chief Complaint  Patient presents with  . Knee Pain        Subjective:   Martin [name] who fell a few days ago and twisted his right knee, since then he's been having pain and discomfort in the right knee, hardly able to flex it or bear weight on it. Denies any fever chills denies any puncture wounds.  Objective:    Filed Vitals:   11/25/12 1225  BP: 124/91  Pulse: 89  Temp: 98.5 F (36.9 C)  TempSrc: Oral  Resp: 18  SpO2: 98%     Exam  Awake Alert, Oriented X 3, No new F.N deficits, Normal affect [place].AT,PERRAL Supple Neck,No JVD, No cervical lymphadenopathy appriciated.  Symmetrical Chest wall movement, Good air movement bilaterally, CTAB RRR,No Gallops,Rubs or new Murmurs, No Parasternal Heave +ve B.Sounds, Abd Soft, Non tender, No organomegaly appriciated, No rebound - guarding or rigidity. No Cyanosis, Clubbing or edema, No new Rash or bruise  Right knee has effusion as compared to left, tender active and passive range of motion    Data Review   CBC No results found for this basename: WBC, HGB, HCT, PLT, MCV, MCH, MCHC, RDW, NEUTRABS, LYMPHSABS, MONOABS, EOSABS, BASOSABS, BANDABS, BANDSABD,  in the last 168 hours  Chemistries   No results found for this basename: NA, K, CL, CO2, GLUCOSE, BUN, CREATININE, GFRCGP, CALCIUM, MG, AST, ALT, ALKPHOS, BILITOT,  in the last 168 hours ------------------------------------------------------------------------------------------------------------------ No results found for this basename: HGBA1C,  in the last 72 hours ------------------------------------------------------------------------------------------------------------------ No results found for this basename: CHOL, HDL, LDLCALC, TRIG, CHOLHDL, LDLDIRECT,  in the last 72  hours ------------------------------------------------------------------------------------------------------------------ No results found for this basename: TSH, T4TOTAL, FREET3, T3FREE, THYROIDAB,  in the last 72 hours ------------------------------------------------------------------------------------------------------------------ No results found for this basename: VITAMINB12, FOLATE, FERRITIN, TIBC, IRON, RETICCTPCT,  in the last 72 hours  Coagulation profile  No results found for this basename: INR, PROTIME,  in the last 168 hours     Prior to Admission medications   Medication Sig Start Date End Date Taking? Authorizing Provider  amoxicillin (AMOXIL) 500 MG capsule Take 1 capsule (500 mg total) by mouth 3 (three) times daily. 1 capsule qid for 7 days 10/17/12   Janne Napoleon, NP  bismuth subsalicylate (PEPTO BISMOL) 262 MG chewable tablet Chew 2 tablets (524 mg total) by mouth 3 (three) times daily. 06/22/12   Jerene Bears, MD  budesonide (ENTOCORT EC) 3 MG 24 hr capsule Take 2 capsules (6 mg total) by mouth every morning. 06/22/12   Jerene Bears, MD  diazepam (VALIUM) 2 MG tablet Take 1 tablet (2 mg total) by mouth at bedtime as needed for sleep. NO FURTHER REFILLS BY DR. SHERRILL 06/14/12   Ladell Pier, MD  emollient (BIAFINE) cream Apply 170 g topically 2 (two) times daily as needed. Radiation burns    Historical Provider, MD  folic acid (FOLVITE) 1 MG tablet Take 1 tablet (1 mg total) by mouth daily. 04/26/12 04/26/13  Jerene Bears, MD  HYDROcodone-acetaminophen ([place]) 7.5-325 MG per tablet Take 1 tablet by mouth every 6 (six) hours as needed for pain. 10/17/12   Janne Napoleon, NP  hyoscyamine (LEVSIN/SL) 0.125 MG SL tablet Place 1 tablet (0.125 mg total) under the tongue every 4 (four) hours as needed for cramping. 04/21/12 05/01/12  Tatyana A Kirichenko, PA-C  ibuprofen (ADVIL,MOTRIN) 600 MG tablet Take 1 tablet (600  mg total) by mouth every 8 (eight) hours as needed for pain. 11/25/12    Thurnell Lose, MD  loperamide (IMODIUM) 2 MG capsule TAKE 2 CAPSULES BY MOUTH FOUR TIMES DAILY AS NEEDED FOR DIARRHEA OR LOOSE STOOLS. 07/12/12   Jerene Bears, MD  OVER THE COUNTER MEDICATION Vit E one every day , mg's unknown    Historical Provider, MD  pantoprazole (PROTONIX) 40 MG tablet Take 1 tablet (40 mg total) by mouth daily. 03/19/12 03/19/13  Jerene Bears, MD  promethazine (PHENERGAN) 12.5 MG tablet Take 12.5 mg by mouth every 6 (six) hours as needed.    Historical Provider, MD  vitamin B-12 (CYANOCOBALAMIN) 1000 MCG tablet Take 1 tablet (1,000 mcg total) by mouth daily. 03/23/12 03/23/13  Jerene Bears, MD     Assessment & Plan   Right knee injury with large effusion, tenderness, pain full passive and active range of motion.- Ultrasound of the right leg is stable no Baker's cyst no DVT SVT, right knee x-ray shows a large knee effusion, I doubt this is septic arthritis this could be soft tissue meniscal injury with resultant inflammatory response, whoever his right knee needs to be tapped and he would need to see her orthopedic physician. Discussed the case with on-call orthopedic physician Dr. Marcelino Scot who recommends the patient be immediately sent to [facility] [place]. Patient has been given Toradol IM shot here.    Follow-up Information   Follow up with HANDY,MICHAEL H, MD. Schedule an appointment as soon as possible for a visit in 1 week.   Contact information:   [address] [place] [address] [place] [address] [phone]        Thurnell Lose M.D on 11/25/2012 at 2:58 PM   Thurnell Lose, MD 11/25/12 1500

## 2012-11-25 NOTE — ED Notes (Signed)
Patient states fell on the concrete about 4 days ago. Has pain In both legs-right knee and leg is the worse

## 2012-11-25 NOTE — ED Notes (Signed)
Called x's 3 without response

## 2012-11-25 NOTE — Progress Notes (Signed)
VASCULAR LAB PRELIMINARY  PRELIMINARY  PRELIMINARY  PRELIMINARY  Right lower extremity venous Doppler completed.    Preliminary report:  There is no DVT, SVT or Baker's Cyst noted in the right lower extremity.  Martin [name], RVT 11/25/2012, 1:56 PM

## 2012-12-21 ENCOUNTER — Ambulatory Visit: Payer: Self-pay | Admitting: Oncology

## 2013-01-03 ENCOUNTER — Encounter (HOSPITAL_COMMUNITY): Payer: Self-pay | Admitting: Emergency Medicine

## 2013-01-03 ENCOUNTER — Emergency Department (HOSPITAL_COMMUNITY): Payer: Self-pay

## 2013-01-03 ENCOUNTER — Emergency Department (HOSPITAL_COMMUNITY)
Admission: EM | Admit: 2013-01-03 | Discharge: 2013-01-03 | Disposition: A | Discharge status: Home / Self Care | Payer: Self-pay | Attending: Emergency Medicine | Admitting: Emergency Medicine

## 2013-01-03 DIAGNOSIS — R296 Repeated falls: Secondary | ICD-10-CM | POA: Insufficient documentation

## 2013-01-03 DIAGNOSIS — R52 Pain, unspecified: Secondary | ICD-10-CM | POA: Insufficient documentation

## 2013-01-03 DIAGNOSIS — Z8739 Personal history of other diseases of the musculoskeletal system and connective tissue: Secondary | ICD-10-CM | POA: Insufficient documentation

## 2013-01-03 DIAGNOSIS — F17200 Nicotine dependence, unspecified, uncomplicated: Secondary | ICD-10-CM | POA: Insufficient documentation

## 2013-01-03 DIAGNOSIS — S8990XA Unspecified injury of unspecified lower leg, initial encounter: Secondary | ICD-10-CM | POA: Insufficient documentation

## 2013-01-03 DIAGNOSIS — Z923 Personal history of irradiation: Secondary | ICD-10-CM | POA: Insufficient documentation

## 2013-01-03 DIAGNOSIS — M129 Arthropathy, unspecified: Secondary | ICD-10-CM | POA: Insufficient documentation

## 2013-01-03 DIAGNOSIS — Z8701 Personal history of pneumonia (recurrent): Secondary | ICD-10-CM | POA: Insufficient documentation

## 2013-01-03 DIAGNOSIS — Z79899 Other long term (current) drug therapy: Secondary | ICD-10-CM | POA: Insufficient documentation

## 2013-01-03 DIAGNOSIS — Y9289 Other specified places as the place of occurrence of the external cause: Secondary | ICD-10-CM | POA: Insufficient documentation

## 2013-01-03 DIAGNOSIS — Z85048 Personal history of other malignant neoplasm of rectum, rectosigmoid junction, and anus: Secondary | ICD-10-CM | POA: Insufficient documentation

## 2013-01-03 DIAGNOSIS — Z8719 Personal history of other diseases of the digestive system: Secondary | ICD-10-CM | POA: Insufficient documentation

## 2013-01-03 DIAGNOSIS — I10 Essential (primary) hypertension: Secondary | ICD-10-CM | POA: Insufficient documentation

## 2013-01-03 DIAGNOSIS — M1990 Unspecified osteoarthritis, unspecified site: Secondary | ICD-10-CM

## 2013-01-03 DIAGNOSIS — Z8679 Personal history of other diseases of the circulatory system: Secondary | ICD-10-CM | POA: Insufficient documentation

## 2013-01-03 DIAGNOSIS — Y9389 Activity, other specified: Secondary | ICD-10-CM | POA: Insufficient documentation

## 2013-01-03 DIAGNOSIS — M25561 Pain in right knee: Secondary | ICD-10-CM

## 2013-01-03 MED ORDER — HYDROCODONE-ACETAMINOPHEN 5-325 MG PO TABS: 1.0000 | ORAL_TABLET | Freq: Four times a day (QID) | ORAL | Status: DC | PRN

## 2013-01-03 MED ORDER — NAPROXEN 500 MG PO TABS: 500.0000 mg | ORAL_TABLET | Freq: Two times a day (BID) | ORAL | Status: DC

## 2013-01-03 MED ORDER — TRAMADOL HCL 50 MG PO TABS
50.0000 mg | ORAL_TABLET | Freq: Once | ORAL | Status: AC
  Administered 2013-01-03: 50 mg via ORAL
  Filled 2013-01-03: qty 1

## 2013-01-03 NOTE — ED Notes (Signed)
Pt complains of right leg pain after "falling 3 weeks ago"

## 2013-01-03 NOTE — ED Provider Notes (Signed)
History     CSN: [id number]  Arrival date & time 01/03/13  48   First MD Initiated Contact with Patient 01/03/13 1320      Chief Complaint  Patient presents with  . Leg Pain    (Consider location/radiation/quality/duration/timing/severity/associated sxs/prior treatment) HPI Comments: H. is a 52 year old male with a history of arthritis who presents for right knee pain for 3 weeks. Patient states the pain worsened last night which is what brought him into the emergency department today. Patient describes the pain as an intermittent sharp pain sensation behind his knee that is nonradiating. Pain is worse with movement and ambulation and alleviated with rest. Patient states that he fell 3 weeks ago, twisting his knee, but denies any new or recent injury in the last 48-72 hours. Patient states he has taken Aleve, Advil, and Tylenol without relief. Patient denies fever, inability to ambulate, pallor, weakness, and numbness or tingling in his lower extremities. Patient denies a hx of IVDU.  Patient is a 52 y.o. male presenting with leg pain. The history is provided by the patient. No language interpreter was used.  Leg Pain Associated symptoms: no back pain and no fever     Past Medical History  Diagnosis Date  . Inguinal hernia   . GERD (gastroesophageal reflux disease)   . Hypertension     EKG 12/12 EPIC   no  PCP- states increased lately but hasnt been diagnosed formally  . Pneumonia     as child, cough at present with no fever  . Hemorrhoid     internal  . 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 Allergy 2 2013  . Anxiety   . Arthritis     DDD lumbar, arthritis knees  . DJD (degenerative joint disease) of lumbar spine   . Radiation 11/19/11-01/08/12    5040 cGy 28 fx Pelvis and inguinal area    Past Surgical History  Procedure Laterality Date  . Transanal excision  10/14/2011    Dr.Todd Gerkin  . Surgical pathology   10/14/2011    squamous cell ca  of anus  . Appendectomy      age 64 or 54  . Examination under anesthesia  10/14/2011    Procedure: EXAM UNDER ANESTHESIA;  Surgeon: Earnstine Regal, MD;  Location: [facility];  Service: General;  Laterality: N/A;  exam under anethesia, Excision of mass anal canal, 1.5cm  . Inguinal hernia repair  05/11/2012    Procedure: HERNIA REPAIR INGUINAL ADULT;  Surgeon: Earnstine Regal, MD;  Location: [facility];  Service: General;  Laterality: Left;  left inguinal hernia repair with mesh    Family History  Problem Relation Age of Onset  . Colon cancer Father   . Asthma Father   . Cancer Father     prostate  . Hypertension Father   . Asthma Mother   . Hypertension Mother   . Stomach cancer Neg Hx   . Esophageal cancer Neg Hx     History  Substance Use Topics  . Smoking status: Current Every Day Smoker -- 0.30 packs/day for 41 years    Types: Cigarettes  . Smokeless tobacco: Never Used     Comment: Cutting back  . Alcohol Use: 3.6 oz/week    6 Cans of beer per week     Comment: weekend    6pk weekend, beer       Review of Systems  Constitutional: Negative  for fever.  Musculoskeletal: Positive for joint swelling and arthralgias. Negative for back pain and gait problem.  Skin: Negative for pallor.  Neurological: Negative for numbness.  All other systems reviewed and are negative.    Allergies  Aspirin; Codeine; and Morphine and related  Home Medications   Current Outpatient Rx  Name  Route  Sig  Dispense  Refill  . acetaminophen (TYLENOL) 500 MG tablet   Oral   Take 2,000 mg by mouth every 6 (six) hours as needed for pain.         . Aspirin-Salicylamide-Caffeine (BC FAST PAIN RELIEF) 650-195-33.3 MG PACK   Oral   Take 1 packet by mouth every 6 (six) hours as needed (for pain).         Marland Kitchen docusate sodium (COLACE) 100 MG capsule   Oral   Take 100-200 mg by mouth 2 (two) times daily as needed for constipation.         Marland Kitchen emollient (BIAFINE) cream   Topical    Apply 170 g topically 2 (two) times daily as needed. Radiation burns         . ibuprofen (ADVIL,MOTRIN) 200 MG tablet   Oral   Take 200 mg by mouth every 6 (six) hours as needed for pain.         . naproxen sodium (ANAPROX) 220 MG tablet   Oral   Take 440 mg by mouth 2 (two) times daily as needed. For pain.         . traMADol (ULTRAM) 50 MG tablet   Oral   Take 50 mg by mouth every 6 (six) hours as needed for pain.         Marland Kitchen HYDROcodone-acetaminophen (NORCO/VICODIN) 5-325 MG per tablet   Oral   Take 1-2 tablets by mouth every 6 (six) hours as needed for pain.   15 tablet   0   . naproxen (NAPROSYN) 500 MG tablet   Oral   Take 1 tablet (500 mg total) by mouth 2 (two) times daily with a meal.   30 tablet   0     BP 146/100  Pulse 77  Temp(Src) 98.1 F (36.7 C) (Oral)  Resp 16  SpO2 99%  Physical Exam  Nursing note and vitals reviewed. Constitutional: He is oriented to person, place, and time. He appears well-developed and well-nourished. No distress.  HENT:  Head: Normocephalic and atraumatic.  Eyes: Conjunctivae are normal. No scleral icterus.  Neck: Normal range of motion.  Cardiovascular: Normal rate, regular rhythm and intact distal pulses.   DP and PT pulses 2+ bilaterally  Pulmonary/Chest: Effort normal. No respiratory distress.  Abdominal: Soft. He exhibits no distension. There is no tenderness.  Musculoskeletal:       Right hip: Normal.       Right knee: He exhibits normal range of motion, no swelling, no effusion, no ecchymosis, no deformity, no erythema, normal alignment, no LCL laxity, no bony tenderness and no MCL laxity. Tenderness found.       Right ankle: Normal.  TTP of popliteal fossa  Neurological: He is alert and oriented to person, place, and time.  No sensory or motor deficits appreciated. DTRs normal and symmetric. Patient is ambulatory with normal gait.  Skin: Skin is warm and dry. No rash noted. He is not diaphoretic. No erythema. No  pallor.  Psychiatric: He has a normal mood and affect. His behavior is normal.    ED Course  Procedures (including critical care time)  Labs  Reviewed - No data to display Dg Knee Complete 4 Views Right  01/03/2013  *RADIOLOGY REPORT*  Clinical Data: Pain in the right knee.  Twisting today.  RIGHT KNEE - COMPLETE 4+ VIEW  Comparison: 11/25/2012.  Findings: No fractures or bone lesions.  No soft tissue abnormalities.  Small effusion.  Mild, tricompartmental degenerative changes.  IMPRESSION: No acute findings.  Degenerative changes and joint effusion.   Original Report Authenticated By: Vallery Ridge, M.D.      1. Knee pain, acute, right   2. Arthritis      MDM  Patient is a 52 year old male who presents for right knee pain x3 weeks, worsening over the last 48 hours. Right knee x-ray negative for fracture or dislocation, though degenerative changes appreciated. On physical exam patient is neurovascularly intact with TTP of the popliteal fossa. No bony tenderness, effusion, or laxity appreciated. No pain with valgus or varus stressing. Patient is ambulatory with normal gait. No erythema or heat to touch to suspect infection. Patient is stable for discharge with orthopedic followup for further evaluation of symptoms. Will apply a knee sleeve and discharged with Naprosyn for inflammation and Norco for pain. RICE instruction provided and indications for ED return discussed. Patient states comfort and understanding with this discharge plan with no unaddressed concerns.        Antonietta Breach, PA-C 01/03/13 1451

## 2013-01-04 NOTE — ED Provider Notes (Signed)
Medical screening examination/treatment/procedure(s) were performed by non-physician practitioner and as supervising physician I was immediately available for consultation/collaboration.    Johnna Acosta, MD 01/04/13 [phone]

## 2013-02-25 NOTE — Telephone Encounter (Signed)
error 

## 2013-03-17 ENCOUNTER — Ambulatory Visit
Admission: RE | Admit: 2013-03-17 | Discharge: 2013-03-17 | Disposition: A | Discharge status: Home / Self Care | Payer: Self-pay | Source: Ambulatory Visit | Attending: Radiation Oncology | Admitting: Radiation Oncology

## 2013-03-17 ENCOUNTER — Encounter: Payer: Self-pay | Admitting: Radiation Oncology

## 2013-03-17 VITALS — BP 162/88 | HR 78 | Temp 97.7°F | Resp 20 | Wt 159.0 lb

## 2013-03-17 DIAGNOSIS — C210 Malignant neoplasm of anus, unspecified: Secondary | ICD-10-CM

## 2013-03-17 MED ORDER — OXYCODONE-ACETAMINOPHEN 5-325 MG PO TABS: 1.0000 | ORAL_TABLET | ORAL | Status: DC | PRN

## 2013-03-17 NOTE — Progress Notes (Signed)
Pt c/o moderate intermittent rectal and groin pain "ever since radiation". He states he is out of all pain meds, takes BC powder prn. He states he has daily BMs that range from normal to black to "pure blood". He states this has been occuring "off and on" ever since he completed radiation. Pt states he "still has cream in a jar he was given" and applies it to his rectal area for pain. This cream is most likely Silvadene although pt does not recall. Pt states he has "lots of gas". Pt states he has urinary urgency, frequency q 2-3 hrs depending on his fluid intake. He denies dysuria. Pt states he saw a dr at [facility] during radiation treatment and was told he had intestine infection. Pt was given samples of medication and script but states he could not afford the medication.  Pt denies fatigue, loss of appetite. He did not see Dr Benay Spice in April.

## 2013-03-17 NOTE — Progress Notes (Signed)
Radiation Oncology         [phone] ________________________________  Name: Martin [name] MRN: [id number]  Date: 03/17/2013  DOB: Sep 27, 1960  Follow-Up Visit Note  CC: No primary provider on file.  Earnstine Regal, MD  Diagnosis:   Anal Cancer  Interval Since Last Radiation:  2 years and 2 months   Narrative:  The patient returns today for routine follow-up.  The patient continues to complain of pain in the rectum and anal area. He is on out of his prescription pain medication. He does take Advil, Aleve Goodie powders, with limited success.  He denies any fecal incontinence. He does occasionally notice bright red blood with bowel movements.  He does have well-formed movements at this time. He denies any cough or breathing problems.  Patient says he did not followup with gastroenterology concerning his rectal bleeding in light of insurance issues                              ALLERGIES:  is allergic to aspirin; codeine; and morphine and related.  Meds: Current Outpatient Prescriptions  Medication Sig Dispense Refill  . acetaminophen (TYLENOL) 500 MG tablet Take 2,000 mg by mouth every 6 (six) hours as needed for pain.      . Aspirin-Salicylamide-Caffeine (BC FAST PAIN RELIEF) 650-195-33.3 MG PACK Take 1 packet by mouth every 6 (six) hours as needed (for pain).      Marland Kitchen docusate sodium (COLACE) 100 MG capsule Take 100-200 mg by mouth 2 (two) times daily as needed for constipation.      Marland Kitchen emollient (BIAFINE) cream Apply 170 g topically 2 (two) times daily as needed. Radiation burns      . HYDROcodone-acetaminophen (NORCO/VICODIN) 5-325 MG per tablet Take 1-2 tablets by mouth every 6 (six) hours as needed for pain.  15 tablet  0  . ibuprofen (ADVIL,MOTRIN) 200 MG tablet Take 200 mg by mouth every 6 (six) hours as needed for pain.      . naproxen (NAPROSYN) 500 MG tablet Take 1 tablet (500 mg total) by mouth 2 (two) times daily with a meal.  30 tablet  0  . naproxen sodium (ANAPROX) 220 MG  tablet Take 440 mg by mouth 2 (two) times daily as needed. For pain.      Marland Kitchen oxyCODONE-acetaminophen (PERCOCET/ROXICET) 5-325 MG per tablet Take 1 tablet by mouth every 4 (four) hours as needed for pain.  100 tablet  0  . traMADol (ULTRAM) 50 MG tablet Take 50 mg by mouth every 6 (six) hours as needed for pain.       No current facility-administered medications for this encounter.    Physical Findings: The patient is in no acute distress. Patient is alert and oriented.  weight is 159 lb (72.122 kg). His oral temperature is 97.7 F (36.5 C). His blood pressure is 162/88 and his pulse is 78. His respiration is 20. Marland Kitchen  No palpable supraclavicular or axillary adenopathy. The lungs are clear to auscultation. The heart has a regular rhythm and rate. The abdomen is soft and nontender with normal bowel sounds. There is no inguinal adenopathy appreciated. The patient's inguinal hernia scar is well-healed. The testicles are soft without masses. Patient is a normal uncircumcised male. Examination of the peri Rectal skin reveals no suspicious lesions. A digital exam is performed. Sphincter tone is good. There no palpable masses within the anus or lower rectum. The exam is quite uncomfortable for  the patient.  Lab Findings: Lab Results  Component Value Date   WBC 7.9 08/12/2012   HGB 14.2 08/12/2012   HCT 40.5 08/12/2012   MCV 96.2 08/12/2012   PLT 154 08/12/2012      Radiographic Findings: No results found.  Impression:  The patient is recovering from the effects of radiation. No evidence of recurrence on clinical exam today.  He continues to have chronic pain in the rectum region. Patient is been given a limited prescription for Percocet which has been helpful in the past. I advised them to avoid significant nonsteroidal anti-inflammatory agents in light of potential for bleeding. Also advised him not to use significant amounts of aspirin as this may be contributing to his bleeding. The patient is  intolerant of codeine and Vicodin with nausea  Plan:  Routine followup in 4 months. I have requested social work consult concerning the patient's health insurance issues  _____________________________________  -----------------------------------  Blair Promise, PhD, MD

## 2013-03-25 ENCOUNTER — Encounter: Payer: Self-pay | Admitting: Radiation Oncology

## 2013-03-25 NOTE — Progress Notes (Signed)
I spoke with patient today, he stated he has applied for Medicaid before and has a denial letter from when he was denied for disability.  He had previously received financial assistance and it expired.  He received a new application and I directed him to go ahead and fill it out and take it or mail it in.

## 2013-04-18 ENCOUNTER — Telehealth: Payer: Self-pay | Admitting: *Deleted

## 2013-04-18 ENCOUNTER — Other Ambulatory Visit: Payer: Self-pay | Admitting: Radiation Oncology

## 2013-04-18 MED ORDER — OXYCODONE-ACETAMINOPHEN 5-325 MG PO TABS: 1.0000 | ORAL_TABLET | ORAL | Status: DC | PRN

## 2013-04-18 NOTE — Telephone Encounter (Signed)
Received vm from pt requesting refill on Oxycodone 5-325 mg. Discussed w/Dr Sondra Come. Per Dr Sondra Come, script will be refilled. Called pt and informed him script will be ready for pick up today at 5 pm. Advised pt to bring photo ID. Pt verbalized understanding.

## 2013-05-07 ENCOUNTER — Encounter (HOSPITAL_COMMUNITY): Payer: Self-pay | Admitting: Emergency Medicine

## 2013-05-07 ENCOUNTER — Emergency Department (INDEPENDENT_AMBULATORY_CARE_PROVIDER_SITE_OTHER)
Admission: EM | Admit: 2013-05-07 | Discharge: 2013-05-07 | Disposition: A | Discharge status: Home / Self Care | Payer: Self-pay | Source: Home / Self Care

## 2013-05-07 DIAGNOSIS — L738 Other specified follicular disorders: Secondary | ICD-10-CM

## 2013-05-07 MED ORDER — DOXYCYCLINE HYCLATE 100 MG PO CAPS: 100.0000 mg | ORAL_CAPSULE | Freq: Two times a day (BID) | ORAL | Status: DC

## 2013-05-07 MED ORDER — CEPHALEXIN 500 MG PO CAPS: 500.0000 mg | ORAL_CAPSULE | Freq: Four times a day (QID) | ORAL | Status: DC

## 2013-05-07 NOTE — ED Notes (Signed)
Pt c/o rash on bilateral arms and axilla onset 3 days Reports his co-worker has similar sxs... Works as a [employer] conservation officer Sxs include: itchiness and pain Denies: fevers, drainage.... Alert w/no signs of acute distress.

## 2013-05-07 NOTE — ED Provider Notes (Signed)
CSN: [id number]     Arrival date & time 05/07/13  0901 History   None    Chief Complaint  Patient presents with  . Rash   (Consider location/radiation/quality/duration/timing/severity/associated sxs/prior Treatment) Patient is a 52 y.o. male presenting with rash. The history is provided by the patient.  Rash Pain severity:  Mild Onset quality:  Gradual Timing:  Constant Progression:  Worsening Chronicity:  New   Past Medical History  Diagnosis Date  . Inguinal hernia   . GERD (gastroesophageal reflux disease)   . Hypertension     EKG 12/12 EPIC   no  PCP- states increased lately but hasnt been diagnosed formally  . Pneumonia     as child, cough at present with no fever  . Hemorrhoid     internal  . 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 Allergy 2 2013  . Anxiety   . Arthritis     DDD lumbar, arthritis knees  . DJD (degenerative joint disease) of lumbar spine   . Radiation 11/19/11-01/08/12    5040 cGy 28 fx Pelvis and inguinal area   Past Surgical History  Procedure Laterality Date  . Transanal excision  10/14/2011    Dr.Todd Gerkin  . Surgical pathology   10/14/2011    squamous cell ca of anus  . Appendectomy      age 43 or 32  . Examination under anesthesia  10/14/2011    Procedure: EXAM UNDER ANESTHESIA;  Surgeon: Earnstine Regal, MD;  Location: [facility];  Service: General;  Laterality: N/A;  exam under anethesia, Excision of mass anal canal, 1.5cm  . Inguinal hernia repair  05/11/2012    Procedure: HERNIA REPAIR INGUINAL ADULT;  Surgeon: Earnstine Regal, MD;  Location: [facility];  Service: General;  Laterality: Left;  left inguinal hernia repair with mesh   Family History  Problem Relation Age of Onset  . Colon cancer Father   . Asthma Father   . Cancer Father     prostate  . Hypertension Father   . Asthma Mother   . Hypertension Mother   . Stomach cancer Neg Hx   . Esophageal cancer Neg Hx    History  Substance Use  Topics  . Smoking status: Current Every Day Smoker -- 0.30 packs/day for 41 years    Types: Cigarettes  . Smokeless tobacco: Never Used     Comment: Cutting back  . Alcohol Use: 3.6 oz/week    6 Cans of beer per week     Comment: weekend    6pk weekend, beer     Review of Systems  Constitutional: Negative.   Skin: Positive for rash.    Allergies  Aspirin; Codeine; and Morphine and related  Home Medications   Current Outpatient Rx  Name  Route  Sig  Dispense  Refill  . acetaminophen (TYLENOL) 500 MG tablet   Oral   Take 2,000 mg by mouth every 6 (six) hours as needed for pain.         . Aspirin-Salicylamide-Caffeine (BC FAST PAIN RELIEF) 650-195-33.3 MG PACK   Oral   Take 1 packet by mouth every 6 (six) hours as needed (for pain).         . cephALEXin (KEFLEX) 500 MG capsule   Oral   Take 1 capsule (500 mg total) by mouth 4 (four) times daily. Take all of medicine and drink lots of fluids   20 capsule  0   . docusate sodium (COLACE) 100 MG capsule   Oral   Take 100-200 mg by mouth 2 (two) times daily as needed for constipation.         Marland Kitchen doxycycline (VIBRAMYCIN) 100 MG capsule   Oral   Take 1 capsule (100 mg total) by mouth 2 (two) times daily.   20 capsule   0   . emollient (BIAFINE) cream   Topical   Apply 170 g topically 2 (two) times daily as needed. Radiation burns         . HYDROcodone-acetaminophen (NORCO/VICODIN) 5-325 MG per tablet   Oral   Take 1-2 tablets by mouth every 6 (six) hours as needed for pain.   15 tablet   0   . ibuprofen (ADVIL,MOTRIN) 200 MG tablet   Oral   Take 200 mg by mouth every 6 (six) hours as needed for pain.         . naproxen (NAPROSYN) 500 MG tablet   Oral   Take 1 tablet (500 mg total) by mouth 2 (two) times daily with a meal.   30 tablet   0   . naproxen sodium (ANAPROX) 220 MG tablet   Oral   Take 440 mg by mouth 2 (two) times daily as needed. For pain.         Marland Kitchen oxyCODONE-acetaminophen  (PERCOCET/ROXICET) 5-325 MG per tablet   Oral   Take 1 tablet by mouth every 4 (four) hours as needed for pain.   100 tablet   0   . traMADol (ULTRAM) 50 MG tablet   Oral   Take 50 mg by mouth every 6 (six) hours as needed for pain.          BP 151/78  Pulse 79  Temp(Src) 99 F (37.2 C) (Oral)  Resp 16  SpO2 96% Physical Exam  Nursing note and vitals reviewed. Constitutional: He is oriented to person, place, and time. He appears well-developed and well-nourished.  Neurological: He is alert and oriented to person, place, and time.  Skin: Skin is warm and dry. Rash noted.  Tender papulopustular lesions on bilat upper ext and axilla      ED Course  Procedures (including critical care time) Labs Review Labs Reviewed - No data to display Imaging Review No results found.  MDM   1. Bacterial folliculitis       Billy Fischer, MD 05/07/13 [phone]

## 2013-05-23 ENCOUNTER — Other Ambulatory Visit: Payer: Self-pay | Admitting: Radiation Oncology

## 2013-05-23 MED ORDER — OXYCODONE-ACETAMINOPHEN 5-325 MG PO TABS: 1.0000 | ORAL_TABLET | ORAL | Status: DC | PRN

## 2013-05-31 ENCOUNTER — Other Ambulatory Visit: Payer: Self-pay | Admitting: Radiation Oncology

## 2013-05-31 ENCOUNTER — Telehealth: Payer: Self-pay | Admitting: Oncology

## 2013-05-31 MED ORDER — OXYCODONE-ACETAMINOPHEN 5-325 MG PO TABS: 1.0000 | ORAL_TABLET | ORAL | Status: DC | PRN

## 2013-05-31 NOTE — Telephone Encounter (Addendum)
Martin [name] called and said he would like a refill on his oxyCODONE-acetaminophen (PERCOCET/ROXICET) 5-325 MG. He says he has been taking a tablet every 4 hours for pain in his stomach. He also said he has been seeing blood in his stools.  Notified Dr. Sondra Come.  Per Dr. Sondra Come he should be seen in the next 2-3 weeks.  Called patient back and a follow up appointment has been made for 06/16/2013.  Called Martin [name] back to let him know his refill for percocet is ready for him to pick up.

## 2013-06-16 ENCOUNTER — Ambulatory Visit
Admission: RE | Admit: 2013-06-16 | Discharge: 2013-06-16 | Disposition: A | Discharge status: Home / Self Care | Payer: No Typology Code available for payment source | Source: Ambulatory Visit | Attending: Radiation Oncology | Admitting: Radiation Oncology

## 2013-06-16 ENCOUNTER — Encounter: Payer: Self-pay | Admitting: Radiation Oncology

## 2013-06-16 ENCOUNTER — Ambulatory Visit
Admission: RE | Admit: 2013-06-16 | Discharge: 2013-06-16 | Disposition: A | Discharge status: Home / Self Care | Payer: Self-pay | Source: Ambulatory Visit | Attending: Radiation Oncology | Admitting: Radiation Oncology

## 2013-06-16 ENCOUNTER — Telehealth: Payer: Self-pay | Admitting: Nutrition

## 2013-06-16 VITALS — BP 134/76 | HR 71 | Temp 97.8°F | Ht 68.5 in | Wt 147.8 lb

## 2013-06-16 DIAGNOSIS — C210 Malignant neoplasm of anus, unspecified: Secondary | ICD-10-CM

## 2013-06-16 DIAGNOSIS — Z79899 Other long term (current) drug therapy: Secondary | ICD-10-CM | POA: Insufficient documentation

## 2013-06-16 DIAGNOSIS — Z7982 Long term (current) use of aspirin: Secondary | ICD-10-CM | POA: Insufficient documentation

## 2013-06-16 LAB — COMPREHENSIVE METABOLIC PANEL (CC13)
Anion Gap: 7 mEq/L (ref 3–11)
BUN: 11.5 mg/dL (ref 7.0–26.0)
CO2: 26 mEq/L (ref 22–29)
Calcium: 9.1 mg/dL (ref 8.4–10.4)
Chloride: 103 mEq/L (ref 98–109)
Creatinine: 0.8 mg/dL (ref 0.7–1.3)

## 2013-06-16 LAB — CBC WITH DIFFERENTIAL/PLATELET
Basophils Absolute: 0 10*3/uL (ref 0.0–0.1)
EOS%: 1.4 % (ref 0.0–7.0)
HCT: 39.3 % (ref 38.4–49.9)
HGB: 13.5 g/dL (ref 13.0–17.1)
LYMPH%: 20.1 % (ref 14.0–49.0)
MCH: 32.7 pg (ref 27.2–33.4)
NEUT%: 72.6 % (ref 39.0–75.0)
Platelets: 146 10*3/uL (ref 140–400)
lymph#: 1.6 10*3/uL (ref 0.9–3.3)

## 2013-06-16 MED ORDER — OXYCODONE-ACETAMINOPHEN 5-325 MG PO TABS: 1.0000 | ORAL_TABLET | ORAL | Status: DC | PRN

## 2013-06-16 NOTE — Progress Notes (Signed)
Radiation Oncology         [phone] ________________________________  Name: Martin [name] MRN: [id number]  Date: 06/16/2013  DOB: [birth year]  Follow-Up Visit Note  CC: No PCP Per Patient  Gerkin, Merlinda Frederick, MD  Diagnosis:   Anal carcinoma  Interval Since Last Radiation:  2 years and 5 months   Narrative:  The patient returns today for routine follow-up.  He continues to have pain in the low rectum and anus area. Controlled well with taking approximately 3-4 Percocet per day. Patient stopped taking aspirin products for this pain. He seems to be having less rectal bleeding. He occasionally will have bright red blood or rectum which she says happens approximately every 2 weeks. He is unsure what causes this to happen.  Patient has lost some weight but has started back working part-time and has stopped taking his nutritional supplements. Patient admits that he cannot afford the nutritional supplements.                              ALLERGIES:  is allergic to aspirin; codeine; and morphine and related.  Meds: Current Outpatient Prescriptions  Medication Sig Dispense Refill  . emollient (BIAFINE) cream Apply 170 g topically 2 (two) times daily as needed. Radiation burns      . oxyCODONE-acetaminophen (PERCOCET/ROXICET) 5-325 MG per tablet Take 1 tablet by mouth every 4 (four) hours as needed for pain.  100 tablet  0  . acetaminophen (TYLENOL) 500 MG tablet Take 2,000 mg by mouth every 6 (six) hours as needed for pain.      . Aspirin-Salicylamide-Caffeine (BC FAST PAIN RELIEF) 650-195-33.3 MG PACK Take 1 packet by mouth every 6 (six) hours as needed (for pain).      . cephALEXin (KEFLEX) 500 MG capsule Take 1 capsule (500 mg total) by mouth 4 (four) times daily. Take all of medicine and drink lots of fluids  20 capsule  0  . docusate sodium (COLACE) 100 MG capsule Take 100-200 mg by mouth 2 (two) times daily as needed for constipation.      Marland Kitchen doxycycline (VIBRAMYCIN) 100 MG capsule Take 1  capsule (100 mg total) by mouth 2 (two) times daily.  20 capsule  0  . HYDROcodone-acetaminophen (NORCO/VICODIN) 5-325 MG per tablet Take 1-2 tablets by mouth every 6 (six) hours as needed for pain.  15 tablet  0  . ibuprofen (ADVIL,MOTRIN) 200 MG tablet Take 200 mg by mouth every 6 (six) hours as needed for pain.      . naproxen (NAPROSYN) 500 MG tablet Take 1 tablet (500 mg total) by mouth 2 (two) times daily with a meal.  30 tablet  0  . naproxen sodium (ANAPROX) 220 MG tablet Take 440 mg by mouth 2 (two) times daily as needed. For pain.      . traMADol (ULTRAM) 50 MG tablet Take 50 mg by mouth every 6 (six) hours as needed for pain.       No current facility-administered medications for this encounter.    Physical Findings: The patient is in no acute distress. Patient is alert and oriented.  height is 5' 8.5" (1.74 m) and weight is 147 lb 12.8 oz (67.042 kg). His temperature is 97.8 F (36.6 C). His blood pressure is 134/76 and his pulse is 71. His oxygen saturation is 100%. Marland Kitchen  He lungs are clear. The heart has a regular rhythm and rate. Rectal exam is not performed  at the patient's request  Lab Findings: Lab Results  Component Value Date   WBC 7.9 08/12/2012   HGB 14.2 08/12/2012   HCT 40.5 08/12/2012   MCV 96.2 08/12/2012   PLT 154 08/12/2012      Radiographic Findings: No results found.  Impression:  Clinically stable.   Plan:  The patient will be sent to the lab for routine blood work and to rule out anemia in light of his rectal bleeding.  I did refill his Percocet today. Patient will return in approximately 6 months for followup or sooner if his rectal bleeding worsens  _____________________________________  -----------------------------------  Blair Promise, PhD, MD

## 2013-06-16 NOTE — Telephone Encounter (Signed)
Called patient who reported weight loss and difficulty affording Ensure.  He requested coupons.  I am able to provide one complimentary case of Ensure Plus along with coupons.  I will leave them at the front desk for him to pick up on Friday., Oct 10.  Patient appreciative.

## 2013-06-16 NOTE — Progress Notes (Signed)
Martin [name] here for follow up after treatment for anal cancer.  He denies pain today and says his percocet is controlling the pain he has in his rectal area.  He does have rectal bleeding occasionally, usually once every 1-2 weeks.  He says the blood fills the toilet bowl and is bright red.  He did have diarrhea yesterday but thinks it is due to what he ate.  He usually does not have diarrhea.  He has lost 11 lbs since 03/17/13.  He says he has stopped drinking ensure due to cost and has been working.  Will contact the dietician to see about getting samples and coupons for ensure.  He denies nausea and dizziness.  He has a recent staph skin infection and was on antibiotics.  He does have a red pimple on his right arm that he said had gone away and has come back.

## 2013-07-10 ENCOUNTER — Emergency Department (HOSPITAL_COMMUNITY)
Admission: EM | Admit: 2013-07-10 | Discharge: 2013-07-10 | Disposition: A | Discharge status: Home / Self Care | Payer: No Typology Code available for payment source | Attending: Emergency Medicine | Admitting: Emergency Medicine

## 2013-07-10 ENCOUNTER — Encounter (HOSPITAL_COMMUNITY): Payer: Self-pay | Admitting: Emergency Medicine

## 2013-07-10 DIAGNOSIS — Z791 Long term (current) use of non-steroidal anti-inflammatories (NSAID): Secondary | ICD-10-CM | POA: Insufficient documentation

## 2013-07-10 DIAGNOSIS — Z8701 Personal history of pneumonia (recurrent): Secondary | ICD-10-CM | POA: Insufficient documentation

## 2013-07-10 DIAGNOSIS — Z792 Long term (current) use of antibiotics: Secondary | ICD-10-CM | POA: Insufficient documentation

## 2013-07-10 DIAGNOSIS — F411 Generalized anxiety disorder: Secondary | ICD-10-CM | POA: Insufficient documentation

## 2013-07-10 DIAGNOSIS — Z85048 Personal history of other malignant neoplasm of rectum, rectosigmoid junction, and anus: Secondary | ICD-10-CM | POA: Insufficient documentation

## 2013-07-10 DIAGNOSIS — L0291 Cutaneous abscess, unspecified: Secondary | ICD-10-CM

## 2013-07-10 DIAGNOSIS — R110 Nausea: Secondary | ICD-10-CM | POA: Insufficient documentation

## 2013-07-10 DIAGNOSIS — M47817 Spondylosis without myelopathy or radiculopathy, lumbosacral region: Secondary | ICD-10-CM | POA: Insufficient documentation

## 2013-07-10 DIAGNOSIS — IMO0002 Reserved for concepts with insufficient information to code with codable children: Secondary | ICD-10-CM | POA: Insufficient documentation

## 2013-07-10 DIAGNOSIS — I10 Essential (primary) hypertension: Secondary | ICD-10-CM | POA: Insufficient documentation

## 2013-07-10 DIAGNOSIS — F17200 Nicotine dependence, unspecified, uncomplicated: Secondary | ICD-10-CM | POA: Insufficient documentation

## 2013-07-10 DIAGNOSIS — Z923 Personal history of irradiation: Secondary | ICD-10-CM | POA: Insufficient documentation

## 2013-07-10 DIAGNOSIS — M1710 Unilateral primary osteoarthritis, unspecified knee: Secondary | ICD-10-CM | POA: Insufficient documentation

## 2013-07-10 DIAGNOSIS — K219 Gastro-esophageal reflux disease without esophagitis: Secondary | ICD-10-CM | POA: Insufficient documentation

## 2013-07-10 MED ORDER — SULFAMETHOXAZOLE-TRIMETHOPRIM 800-160 MG PO TABS: 1.0000 | ORAL_TABLET | Freq: Two times a day (BID) | ORAL | Status: DC

## 2013-07-10 MED ORDER — CEPHALEXIN 500 MG PO CAPS: 500.0000 mg | ORAL_CAPSULE | Freq: Four times a day (QID) | ORAL | Status: DC

## 2013-07-10 NOTE — ED Provider Notes (Signed)
CSN: [id number]     Arrival date & time 07/10/13  1418 History  This chart was scribed for non-physician practitioner, Glendell Docker, NP working with Orlie Dakin, MD by Frederich Balding, ED scribe. This patient was seen in room WTR8/WTR8 and the patient's care was started at 2:57 PM.   Chief Complaint  Patient presents with  . Abscess   The history is provided by the patient. No language interpreter was used.   HPI Comments: Martin [name] is a 52 y.o. male who presents to the Emergency Department complaining of a recurrent abscess under his right arm that he first noticed one month ago. He states it is starting to come back again. Pt states there has been yellow pus draining. He was seen at [facility] a few weeks ago when it started. Pt states he has been nauseous due to the pain.   Past Medical History  Diagnosis Date  . Inguinal hernia   . GERD (gastroesophageal reflux disease)   . Hypertension     EKG 12/12 EPIC   no  PCP- states increased lately but hasnt been diagnosed formally  . Pneumonia     as child, cough at present with no fever  . Hemorrhoid     internal  . 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 Allergy 2 2013  . Anxiety   . Arthritis     DDD lumbar, arthritis knees  . DJD (degenerative joint disease) of lumbar spine   . Radiation 11/19/11-01/08/12    5040 cGy 28 fx Pelvis and inguinal area   Past Surgical History  Procedure Laterality Date  . Transanal excision  10/14/2011    Dr.Todd Gerkin  . Surgical pathology   10/14/2011    squamous cell ca of anus  . Appendectomy      age 25 or 77  . Examination under anesthesia  10/14/2011    Procedure: EXAM UNDER ANESTHESIA;  Surgeon: Earnstine Regal, MD;  Location: [facility];  Service: General;  Laterality: N/A;  exam under anethesia, Excision of mass anal canal, 1.5cm  . Inguinal hernia repair  05/11/2012    Procedure: HERNIA REPAIR INGUINAL ADULT;  Surgeon: Earnstine Regal, MD;  Location: [facility];  Service: General;  Laterality: Left;  left inguinal hernia repair with mesh   Family History  Problem Relation Age of Onset  . Colon cancer Father   . Asthma Father   . Cancer Father     prostate  . Hypertension Father   . Asthma Mother   . Hypertension Mother   . Stomach cancer Neg Hx   . Esophageal cancer Neg Hx    History  Substance Use Topics  . Smoking status: Current Every Day Smoker -- 0.30 packs/day for 41 years    Types: Cigarettes  . Smokeless tobacco: Never Used     Comment: Cutting back  . Alcohol Use: 3.6 oz/week    6 Cans of beer per week     Comment: weekend    6pk weekend, beer     Review of Systems  Gastrointestinal: Positive for nausea.  Skin:       Positive for abscess.   All other systems reviewed and are negative.    Allergies  Aspirin; Codeine; and Morphine and related  Home Medications   Current Outpatient Rx  Name  Route  Sig  Dispense  Refill  . acetaminophen (TYLENOL) 500 MG tablet  Oral   Take 2,000 mg by mouth every 6 (six) hours as needed for pain.         . Aspirin-Salicylamide-Caffeine (BC FAST PAIN RELIEF) 650-195-33.3 MG PACK   Oral   Take 1 packet by mouth every 6 (six) hours as needed (for pain).         . cephALEXin (KEFLEX) 500 MG capsule   Oral   Take 1 capsule (500 mg total) by mouth 4 (four) times daily. Take all of medicine and drink lots of fluids   20 capsule   0   . docusate sodium (COLACE) 100 MG capsule   Oral   Take 100-200 mg by mouth 2 (two) times daily as needed for constipation.         Marland Kitchen doxycycline (VIBRAMYCIN) 100 MG capsule   Oral   Take 1 capsule (100 mg total) by mouth 2 (two) times daily.   20 capsule   0   . emollient (BIAFINE) cream   Topical   Apply 170 g topically 2 (two) times daily as needed. Radiation burns         . HYDROcodone-acetaminophen (NORCO/VICODIN) 5-325 MG per tablet   Oral   Take 1-2 tablets by mouth every 6 (six) hours as needed for pain.    15 tablet   0   . ibuprofen (ADVIL,MOTRIN) 200 MG tablet   Oral   Take 200 mg by mouth every 6 (six) hours as needed for pain.         . naproxen (NAPROSYN) 500 MG tablet   Oral   Take 1 tablet (500 mg total) by mouth 2 (two) times daily with a meal.   30 tablet   0   . naproxen sodium (ANAPROX) 220 MG tablet   Oral   Take 440 mg by mouth 2 (two) times daily as needed. For pain.         Marland Kitchen oxyCODONE-acetaminophen (PERCOCET/ROXICET) 5-325 MG per tablet   Oral   Take 1 tablet by mouth every 4 (four) hours as needed for pain.   100 tablet   0   . traMADol (ULTRAM) 50 MG tablet   Oral   Take 50 mg by mouth every 6 (six) hours as needed for pain.          BP 143/86  Pulse 74  Temp(Src) 98.3 F (36.8 C) (Oral)  Resp 16  SpO2 100%  Physical Exam  Nursing note and vitals reviewed. Constitutional: He is oriented to person, place, and time. He appears well-developed and well-nourished. No distress.  HENT:  Head: Normocephalic and atraumatic.  Eyes: EOM are normal.  Neck: Neck supple. No tracheal deviation present.  Cardiovascular: Normal rate, regular rhythm and normal heart sounds.   Pulmonary/Chest: Effort normal and breath sounds normal. No respiratory distress. He has no wheezes. He has no rales.  Musculoskeletal: Normal range of motion.  Neurological: He is alert and oriented to person, place, and time.  Skin:  Pt has 3 small red raised fluctuant areas to the right axilla:no drainage noted at this time  Psychiatric: He has a normal mood and affect. His behavior is normal.    ED Course  Procedures (including critical care time)  DIAGNOSTIC STUDIES: Oxygen Saturation is 100% on RA, normal by my interpretation.    COORDINATION OF CARE: 2:59 PM-Discussed treatment plan which includes an antibiotic with pt at bedside and pt agreed to plan.   Labs Review Labs Reviewed - No data to display Imaging Review  No results found.  EKG Interpretation   None        MDM   1. Abscess    Will treat with antibiotics;area not large enough to I&D    I personally performed the services described in this documentation, which was scribed in my presence. The recorded information has been reviewed and is accurate.   Glendell Docker, NP 07/10/13 1526

## 2013-07-10 NOTE — ED Notes (Signed)
Pt states he had an abscess under his arm about a month ago but states that it is coming back now.  Under rt arm.

## 2013-07-10 NOTE — ED Provider Notes (Signed)
Medical screening examination/treatment/procedure(s) were performed by non-physician practitioner and as supervising physician I was immediately available for consultation/collaboration.  EKG Interpretation   None        Orlie Dakin, MD 07/10/13 1600

## 2013-07-14 ENCOUNTER — Other Ambulatory Visit: Payer: Self-pay | Admitting: Radiation Oncology

## 2013-07-14 MED ORDER — OXYCODONE-ACETAMINOPHEN 5-325 MG PO TABS: 1.0000 | ORAL_TABLET | ORAL | Status: DC | PRN

## 2013-07-15 ENCOUNTER — Telehealth: Payer: Self-pay | Admitting: *Deleted

## 2013-07-15 NOTE — Telephone Encounter (Signed)
Called pt and informed him Oxycodone refill prescription is ready to be picked up in rad onc nursing area. Advised he or whoever picks up script bring a photo ID. Pt verbalized understanding.

## 2013-07-18 ENCOUNTER — Ambulatory Visit: Payer: Self-pay | Admitting: Radiation Oncology

## 2013-08-11 ENCOUNTER — Other Ambulatory Visit: Payer: Self-pay | Admitting: Radiation Oncology

## 2013-08-11 ENCOUNTER — Telehealth: Payer: Self-pay

## 2013-08-11 MED ORDER — OXYCODONE-ACETAMINOPHEN 5-325 MG PO TABS: 1.0000 | ORAL_TABLET | ORAL | Status: DC | PRN

## 2013-08-11 NOTE — Telephone Encounter (Signed)
Patient called for refill prescription (percocet 5/325)in-basket sent to Dr.Kinard.Will call patient when ready for pick up.

## 2013-09-02 IMAGING — PT NM PET TUM IMG INITIAL (PI) SKULL BASE T - THIGH
1 of 6 series · 1 of 25 positions shown · non-contrast
Comparison: Plain film chest of 10/13/2011.  Abdominal pelvic CTs
of 08/28/2011.

CLINICAL DATA: Initial treatment strategy for new diagnosis of anal
cancer.  Staging. Status post transanal excision on 10/24/2011.

NUCLEAR MEDICINE PET CT SKULL BASE TO THIGH
TECHNIQUE: 18.8 mCi F-18 FDG was injected intravenously via the
right AC.  Full-ring PET imaging was performed from the skull base
through the mid-thighs 75  minutes after injection.  CT data was
obtained and used for attenuation correction and anatomic
localization only.  (This was not acquired as a diagnostic CT
examination.)
Fasting Blood Glucose:  85

[Series 2: ct images · axial · 3.8mm · 0.98mm/px · 1 of 267 slices shown]
[im 267/267  brain]
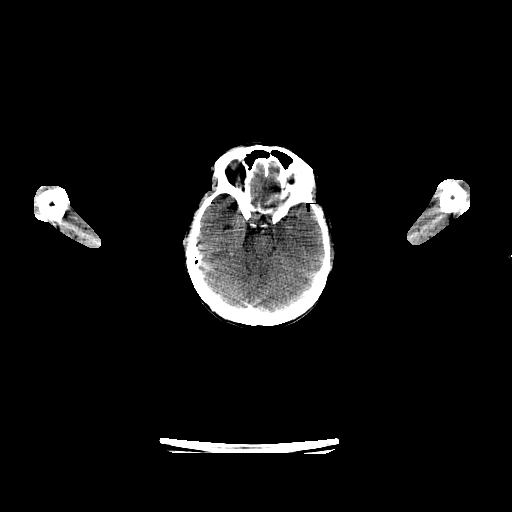

[1 of 25 positions shown; findings below may reference images not displayed]

FINDINGS: PET images demonstrate no abnormal activity within the
neck, chest, or abdomen.  2 foci of right pelvic hypermetabolism
are due to ureteric excretion and are without CT correlate.

Within the 7 o'clock position of the rectum, an area of mild
hypermetabolism corresponds to  probable retained stool.  This
measures a S.U.V. max of 2.5, including on image 242.

Low anal hypermetabolism is symmetric and favored to be
physiologic.  This measures a S.U.V. max of 3.5, including on image
268. No CT correlate.

CT images performed for attenuation correction demonstrate no
significant findings within the neck.

LAD coronary artery atherosclerosis, including on images 101 - 102.
Biapical pleural thickening.  A 3 mm posterior lingular nodule on
image 101.

A posterior gastric diverticulum.  No perirectal or perianal
adenopathy.  The ischio-rectal and ischio-anal fossae are within
normal limits.
IMPRESSION: 1.  No hypermetabolic primary or metastatic disease identified.
Likely physiologic anal and posterior rectal hypermetabolism, as
detailed above.
2.  A tiny lingular nodule is nonspecific and warrants followup
attention.
3. Age advanced coronary artery atherosclerosis.  Recommend
assessment of coronary risk factors and consideration of medical
therapy.

## 2013-09-14 ENCOUNTER — Telehealth: Payer: Self-pay

## 2013-09-14 NOTE — Telephone Encounter (Signed)
Patient called for refill.Has enough percocet to last until 09/15/2013.I informed I would call when script ready.

## 2013-09-15 ENCOUNTER — Other Ambulatory Visit: Payer: Self-pay | Admitting: Radiation Oncology

## 2013-09-15 ENCOUNTER — Telehealth: Payer: Self-pay

## 2013-09-15 MED ORDER — OXYCODONE-ACETAMINOPHEN 5-325 MG PO TABS: 1.0000 | ORAL_TABLET | ORAL | Status: DC | PRN

## 2013-09-15 NOTE — Telephone Encounter (Signed)
Electronic prescription for percocet ready for patient to pick up.He will pickup in the morning 09/16/2013.

## 2013-10-06 ENCOUNTER — Ambulatory Visit
Admission: RE | Admit: 2013-10-06 | Discharge: 2013-10-06 | Disposition: A | Discharge status: Home / Self Care | Payer: No Typology Code available for payment source | Source: Ambulatory Visit | Attending: Radiation Oncology | Admitting: Radiation Oncology

## 2013-10-06 ENCOUNTER — Encounter: Payer: Self-pay | Admitting: Radiation Oncology

## 2013-10-06 VITALS — BP 134/76 | HR 76 | Temp 98.0°F | Ht 68.5 in | Wt 146.9 lb

## 2013-10-06 DIAGNOSIS — C210 Malignant neoplasm of anus, unspecified: Secondary | ICD-10-CM

## 2013-10-06 DIAGNOSIS — R109 Unspecified abdominal pain: Secondary | ICD-10-CM | POA: Insufficient documentation

## 2013-10-06 DIAGNOSIS — G8929 Other chronic pain: Secondary | ICD-10-CM | POA: Insufficient documentation

## 2013-10-06 DIAGNOSIS — Z886 Allergy status to analgesic agent status: Secondary | ICD-10-CM | POA: Insufficient documentation

## 2013-10-06 DIAGNOSIS — F17200 Nicotine dependence, unspecified, uncomplicated: Secondary | ICD-10-CM | POA: Insufficient documentation

## 2013-10-06 DIAGNOSIS — R5381 Other malaise: Secondary | ICD-10-CM | POA: Insufficient documentation

## 2013-10-06 DIAGNOSIS — R5383 Other fatigue: Secondary | ICD-10-CM

## 2013-10-06 DIAGNOSIS — K625 Hemorrhage of anus and rectum: Secondary | ICD-10-CM | POA: Insufficient documentation

## 2013-10-06 DIAGNOSIS — Z79899 Other long term (current) drug therapy: Secondary | ICD-10-CM | POA: Insufficient documentation

## 2013-10-06 LAB — CBC WITH DIFFERENTIAL/PLATELET
BASO%: 0.6 % (ref 0.0–2.0)
Basophils Absolute: 0.1 10*3/uL (ref 0.0–0.1)
EOS%: 1.2 % (ref 0.0–7.0)
Eosinophils Absolute: 0.1 10*3/uL (ref 0.0–0.5)
HEMATOCRIT: 42.1 % (ref 38.4–49.9)
HGB: 14.5 g/dL (ref 13.0–17.1)
LYMPH%: 15.2 % (ref 14.0–49.0)
MCH: 33.4 pg (ref 27.2–33.4)
MCHC: 34.5 g/dL (ref 32.0–36.0)
MCV: 96.8 fL (ref 79.3–98.0)
MONO#: 0.6 10*3/uL (ref 0.1–0.9)
MONO%: 5.3 % (ref 0.0–14.0)
NEUT#: 8.6 10*3/uL — ABNORMAL HIGH (ref 1.5–6.5)
NEUT%: 77.7 % — AB (ref 39.0–75.0)
PLATELETS: 157 10*3/uL (ref 140–400)
RBC: 4.35 10*6/uL (ref 4.20–5.82)
RDW: 12.9 % (ref 11.0–14.6)
WBC: 11.1 10*3/uL — AB (ref 4.0–10.3)
lymph#: 1.7 10*3/uL (ref 0.9–3.3)

## 2013-10-06 MED ORDER — TEMAZEPAM 30 MG PO CAPS: 30.0000 mg | ORAL_CAPSULE | Freq: Every evening | ORAL | Status: DC | PRN

## 2013-10-06 NOTE — Progress Notes (Signed)
Martin [name] here for follow up after treatment to his pelvis.  Martin [name] denies pain today but reports occasional pain in his lower pelvis.  Martin [name] is also having severe pain in both of his legs at night.  Martin [name] is currently taking Percocet 3 times per day.  Martin [name] is fatigued.  Martin [name] had rectal bleeding yesterday.  Martin [name] denies diarrhea and nausea.  Martin [name] has lost 1 lbs since his last visit.  Martin [name] reports drinking ensure and trying to eat more.  Martin [name] reports having urinary frequency.

## 2013-10-06 NOTE — Progress Notes (Signed)
Radiation Oncology         [phone] ________________________________  Name: Martin [name] MRN: [id number]  Date: 10/06/2013  DOB: [birth year]  Follow-Up Visit Note  CC: No PCP Per Patient  Gerkin, Merlinda Frederick, MD  Diagnosis:   Anal carcinoma  Interval Since Last Radiation:  2 years and 8 months  Narrative:  The patient returns today for routine follow-up.  Patient requested be seen earlier than his usual followup appointment. He did have some rectal bleeding but this has stopped at this time. He continues to have chronic pain in the pelvis region.  He has noticed more discomfort in the region of his inguinal scar and inferior to this area.  He is also fatigued.  He is working part-time.  He has not obtained medical insurance and I stressed the importance of this issue.                              ALLERGIES:  is allergic to aspirin; codeine; and morphine and related.  Meds: Current Outpatient Prescriptions  Medication Sig Dispense Refill  . oxyCODONE-acetaminophen (PERCOCET/ROXICET) 5-325 MG per tablet Take 1 tablet by mouth every 4 (four) hours as needed.  100 tablet  0  . acetaminophen (TYLENOL) 500 MG tablet Take 2,000 mg by mouth every 6 (six) hours as needed for pain.      . Aspirin-Salicylamide-Caffeine (BC FAST PAIN RELIEF) 650-195-33.3 MG PACK Take 1 packet by mouth every 6 (six) hours as needed (for pain).      Marland Kitchen docusate sodium (COLACE) 100 MG capsule Take 100-200 mg by mouth 2 (two) times daily as needed for constipation.      Marland Kitchen emollient (BIAFINE) cream Apply 170 g topically 2 (two) times daily as needed. Radiation burns      . HYDROcodone-acetaminophen (NORCO/VICODIN) 5-325 MG per tablet Take 1-2 tablets by mouth every 6 (six) hours as needed for pain.  15 tablet  0  . ibuprofen (ADVIL,MOTRIN) 200 MG tablet Take 200 mg by mouth every 6 (six) hours as needed for pain.      . naproxen (NAPROSYN) 500 MG tablet Take 1 tablet (500 mg total) by mouth 2 (two) times daily with a  meal.  30 tablet  0  . naproxen sodium (ANAPROX) 220 MG tablet Take 440 mg by mouth 2 (two) times daily as needed. For pain.      Marland Kitchen sulfamethoxazole-trimethoprim (SEPTRA DS) 800-160 MG per tablet Take 1 tablet by mouth every 12 (twelve) hours.  14 tablet  0  . temazepam (RESTORIL) 30 MG capsule Take 1 capsule (30 mg total) by mouth at bedtime as needed for sleep.  30 capsule  0  . traMADol (ULTRAM) 50 MG tablet Take 50 mg by mouth every 6 (six) hours as needed for pain.       No current facility-administered medications for this encounter.    Physical Findings: The patient is in no acute distress. Patient is alert and oriented.  height is 5' 8.5" (1.74 m) and weight is 146 lb 14.4 oz (66.633 kg). His temperature is 98 F (36.7 C). His blood pressure is 134/76 and his pulse is 76. His oxygen saturation is 100%. .  No palpable supraclavicular or axillary adenopathy. The lungs are clear to auscultation. The heart has regular rhythm and rate. The abdomen is soft and nontender with normal bowel sounds. The inguinal areas are free of adenopathy. The patient's inguinal scar is well-healed.  I do not detect any hernias on exam today. The patient is a normal circumcised male. The testicles are soft without masses. On rectal examination sphincter tone is noted to be normal. I do not palpate any masses within the anus or lower rectal region.  Lab Findings: Lab Results  Component Value Date   WBC 11.1* 10/06/2013   HGB 14.5 10/06/2013   HCT 42.1 10/06/2013   MCV 96.8 10/06/2013   PLT 157 10/06/2013      Radiographic Findings: No results found.  Impression:  No evidence of recurrence on clinical exam today. I have again stressed the importance of him stopping smoking and offered  smoking cessation classes.  I also stressed the importance of obtaining a primary care physician and recommending proceeding with colonoscopy.  We have placed referral to the family practice teaching program for primary care  issues.  Plan:  Routine followup in 6 months.  CBC today to rule out anemia in light of his recent bleeding.  ____________________________________ Blair Promise, MD

## 2013-10-11 ENCOUNTER — Telehealth: Payer: Self-pay | Admitting: Oncology

## 2013-10-11 ENCOUNTER — Other Ambulatory Visit: Payer: Self-pay | Admitting: Radiation Oncology

## 2013-10-11 ENCOUNTER — Telehealth: Payer: Self-pay | Admitting: Dietician

## 2013-10-11 MED ORDER — OXYCODONE-ACETAMINOPHEN 5-325 MG PO TABS: 1.0000 | ORAL_TABLET | ORAL | Status: DC | PRN

## 2013-10-11 NOTE — Telephone Encounter (Signed)
Called and let Quadir know that his prescription for Percocet is available for him to pick up at the [facility].

## 2013-10-11 NOTE — Telephone Encounter (Signed)
Brief Outpatient Oncology Nutrition Note  Patient has been identified to be at risk on malnutrition screen.  Wt Readings from Last 10 Encounters:  10/06/13 146 lb 14.4 oz (66.633 kg)  06/16/13 147 lb 12.8 oz (67.042 kg)  03/17/13 159 lb (72.122 kg)  09/30/12 152 lb 11.2 oz (69.264 kg)  08/23/12 151 lb 14.4 oz (68.901 kg)  08/12/12 147 lb 12.8 oz (67.042 kg)  08/10/12 145 lb (65.772 kg)  06/22/12 148 lb (67.132 kg)  06/07/12 146 lb 11.2 oz (66.543 kg)  06/02/12 143 lb (64.864 kg)   Dx:  Anal Carcinoma  Called patient due to weight loss.  Patient states that he is not eating as much as he should.  Is drinking Ensure once daily.  Encouraged intake and gave tips to increase calories and protein.  Patient has seen the [facility] RD in the past.  States he tried an AMP drink to get more energy and it "doubled him over".  Encouraged use of nutritional drinks instead.  Will mail patient tips on increasing calories and protein along with coupons for Ensure and [employer].  Instructed patient to increase supplement use to 2-3 daily as needed to maintain weight. Patient to call the [place] [facility] for any needs and provided her card.  Antonieta Iba, RD, LDN

## 2013-10-11 NOTE — Telephone Encounter (Signed)
Martin [name] called and requested a refill of oxyCODONE-acetaminophen (PERCOCET/ROXICET) 5-325 MG.  He said he has "a few" tablets left.  It was last filled on 09/15/13.

## 2013-11-08 ENCOUNTER — Other Ambulatory Visit: Payer: Self-pay | Admitting: Radiation Oncology

## 2013-11-08 ENCOUNTER — Telehealth: Payer: Self-pay | Admitting: Oncology

## 2013-11-08 MED ORDER — OXYCODONE-ACETAMINOPHEN 5-325 MG PO TABS: 1.0000 | ORAL_TABLET | ORAL | Status: DC | PRN

## 2013-11-08 NOTE — Telephone Encounter (Signed)
Martin [name] called and requested a refill on oxyCODONE-acetaminophen (PERCOCET/ROXICET) 5-325 MG per tablet Route: Take 1 tablet by mouth every 4 (four) hours as needed.  He said has a few left.  He last had it filled 10/11/13.

## 2013-11-09 ENCOUNTER — Telehealth: Payer: Self-pay | Admitting: Oncology

## 2013-11-09 NOTE — Telephone Encounter (Signed)
Called to let Martin [name] know that his pain medication prescription is ready to be picked up at the nursing station.  He verbalized agreement.

## 2013-11-30 ENCOUNTER — Ambulatory Visit (INDEPENDENT_AMBULATORY_CARE_PROVIDER_SITE_OTHER): Payer: Self-pay | Admitting: Family Medicine

## 2013-11-30 ENCOUNTER — Encounter: Payer: Self-pay | Admitting: Family Medicine

## 2013-11-30 VITALS — BP 130/79 | HR 75 | Temp 98.0°F | Ht 69.0 in | Wt 147.0 lb

## 2013-11-30 DIAGNOSIS — N529 Male erectile dysfunction, unspecified: Secondary | ICD-10-CM | POA: Insufficient documentation

## 2013-11-30 DIAGNOSIS — F3289 Other specified depressive episodes: Secondary | ICD-10-CM

## 2013-11-30 DIAGNOSIS — F329 Major depressive disorder, single episode, unspecified: Secondary | ICD-10-CM

## 2013-11-30 DIAGNOSIS — F32A Depression, unspecified: Secondary | ICD-10-CM | POA: Insufficient documentation

## 2013-11-30 DIAGNOSIS — G894 Chronic pain syndrome: Secondary | ICD-10-CM

## 2013-11-30 MED ORDER — SILDENAFIL CITRATE 100 MG PO TABS: 50.0000 mg | ORAL_TABLET | Freq: Every day | ORAL | Status: DC | PRN

## 2013-11-30 MED ORDER — CITALOPRAM HYDROBROMIDE 20 MG PO TABS: 20.0000 mg | ORAL_TABLET | Freq: Every day | ORAL | Status: DC

## 2013-11-30 NOTE — Assessment & Plan Note (Signed)
Possibly radiation related, necessary for cancer treatment Trial of viagra but this will be very expensive.

## 2013-11-30 NOTE — Patient Instructions (Addendum)
It was great to meet you today!  Lets get you to see Martin [name] Friendly to see if you qualify for the orange card  Follow up in 2 weeks for depression

## 2013-11-30 NOTE — Assessment & Plan Note (Signed)
Mild to mod Start celexa, f/u 2 weeks Would prefer SNRI as this would help pain as well, but financially prohibitive

## 2013-11-30 NOTE — Assessment & Plan Note (Signed)
Seems to have chronic pain associated with cancer now s/p treatment Currently recieveing oxycodone from rad onc Has tried dilaudid which didn't work. Consider SNRI, gabapentin Refer to pain management which will only be possible if he gets orange card, asked to follow up with barabara in our office to arrange and start the process.

## 2013-11-30 NOTE — Progress Notes (Signed)
Patient ID: Martin [name], male   DOB: [birth year], 53 y.o.   MRN: [id number]  Kenn File, MD Phone: [phone]  Subjective:  Chief complaint-noted  # Itching here to establish care  He states that he was found to have rectal cancer about 2 years ago. Since then he's undergone a resection of the tumor, chemotherapy, and radiation. He states that the chemotherapy and radiation lasting effects on him turning his buttock and groin into "hamburger". He states that from these changes he's had chronic nonspecific pelvic pain and nominal pain that worsens with greasy food.  Currently his cancer doctor is refilling his oxycodone, he gets 100 per month.  Depression He states that he feels very depressed, he is having trouble sleeping, and he has anhedonia His pH to none score is 9  He has problems getting an erection and would like try Viagra. States that since this ordeal started he has not been able to get an erection and have sex. He feels this is contributing to his depressed mood.  He denies suicidal ideation  ROS-  Past Medical History Patient Active Problem List   Diagnosis Date Noted  . Erectile dysfunction 11/30/2013  . Chronic pain syndrome 11/30/2013  . Depression 11/30/2013  . GERD (gastroesophageal reflux disease)   . Hypertension   . Pneumonia   . Anxiety   . Arthritis   . DJD (degenerative joint disease) of lumbar spine   . Radiation   . Back pain 08/12/2012  . Night sweats 08/12/2012  . Tobacco abuse counseling 05/26/2012  . Upper respiratory infection 05/25/2012  . 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 B12 nutritional deficiency 03/19/2012  . Folate deficiency 03/19/2012  . Inguinal hernia 11/24/2011  . Cancer 10/14/2011  . Allergy 10/10/2011  . Internal hemorrhoid 09/11/2011  . Anal cancer 10/09/2010    Medications- reviewed and updated Current Outpatient Prescriptions  Medication Sig Dispense Refill  . acetaminophen (TYLENOL) 500 MG tablet Take 2,000  mg by mouth every 6 (six) hours as needed for pain.      . Aspirin-Salicylamide-Caffeine (BC FAST PAIN RELIEF) 650-195-33.3 MG PACK Take 1 packet by mouth every 6 (six) hours as needed (for pain).      . citalopram (CELEXA) 20 MG tablet Take 1 tablet (20 mg total) by mouth daily.  30 tablet  0  . docusate sodium (COLACE) 100 MG capsule Take 100-200 mg by mouth 2 (two) times daily as needed for constipation.      Marland Kitchen emollient (BIAFINE) cream Apply 170 g topically 2 (two) times daily as needed. Radiation burns      . HYDROcodone-acetaminophen (NORCO/VICODIN) 5-325 MG per tablet Take 1-2 tablets by mouth every 6 (six) hours as needed for pain.  15 tablet  0  . ibuprofen (ADVIL,MOTRIN) 200 MG tablet Take 200 mg by mouth every 6 (six) hours as needed for pain.      . naproxen (NAPROSYN) 500 MG tablet Take 1 tablet (500 mg total) by mouth 2 (two) times daily with a meal.  30 tablet  0  . naproxen sodium (ANAPROX) 220 MG tablet Take 440 mg by mouth 2 (two) times daily as needed. For pain.      Marland Kitchen oxyCODONE-acetaminophen (PERCOCET/ROXICET) 5-325 MG per tablet Take 1 tablet by mouth every 4 (four) hours as needed.  100 tablet  0  . sildenafil (VIAGRA) 100 MG tablet Take 0.5-1 tablets (50-100 mg total) by mouth daily as needed for erectile dysfunction.  5 tablet  11  . sulfamethoxazole-trimethoprim (SEPTRA  DS) 800-160 MG per tablet Take 1 tablet by mouth every 12 (twelve) hours.  14 tablet  0  . temazepam (RESTORIL) 30 MG capsule Take 1 capsule (30 mg total) by mouth at bedtime as needed for sleep.  30 capsule  0  . traMADol (ULTRAM) 50 MG tablet Take 50 mg by mouth every 6 (six) hours as needed for pain.       No current facility-administered medications for this visit.    Objective: BP 130/79  Pulse 75  Temp(Src) 98 F (36.7 C) (Oral)  Ht 5' 9"  (1.753 m)  Wt 147 lb (66.679 kg)  BMI 21.70 kg/m2 Gen: NAD, alert, cooperative with exam, thin HEENT: NCAT, EOMI, PERRL CV: RRR, good S1/S2, no  murmur Resp: CTABL, no wheezes, non-labored Abd: SNTND, BS present, no guarding or organomegaly Ext: No edema, warm Neuro: Alert and oriented, No gross deficits, normal gait   Assessment/Plan:  Chronic pain syndrome Seems to have chronic pain associated with cancer now s/p treatment Currently recieveing oxycodone from rad onc Has tried dilaudid which didn't work. Consider SNRI, gabapentin Refer to pain management which will only be possible if he gets orange card, asked to follow up with barabara in our office to arrange and start the process.   Depression Mild to mod Start celexa, f/u 2 weeks Would prefer SNRI as this would help pain as well, but financially prohibitive   Erectile dysfunction Possibly radiation related, necessary for cancer treatment Trial of viagra but this will be very expensive.     Orders Placed This Encounter  Procedures  . Pain Management Screening Profile (10S)    Meds ordered this encounter  Medications  . DISCONTD: sildenafil (VIAGRA) 100 MG tablet    Sig: Take 0.5-1 tablets (50-100 mg total) by mouth daily as needed for erectile dysfunction.    Dispense:  5 tablet    Refill:  11  . citalopram (CELEXA) 20 MG tablet    Sig: Take 1 tablet (20 mg total) by mouth daily.    Dispense:  30 tablet    Refill:  0  . sildenafil (VIAGRA) 100 MG tablet    Sig: Take 0.5-1 tablets (50-100 mg total) by mouth daily as needed for erectile dysfunction.    Dispense:  5 tablet    Refill:  11

## 2013-12-08 ENCOUNTER — Telehealth: Payer: Self-pay | Admitting: Oncology

## 2013-12-08 ENCOUNTER — Other Ambulatory Visit: Payer: Self-pay | Admitting: Radiation Oncology

## 2013-12-08 MED ORDER — OXYCODONE-ACETAMINOPHEN 5-325 MG PO TABS: 1.0000 | ORAL_TABLET | ORAL | Status: DC | PRN

## 2013-12-08 MED ORDER — TEMAZEPAM 30 MG PO CAPS: 30.0000 mg | ORAL_CAPSULE | Freq: Every evening | ORAL | Status: DC | PRN

## 2013-12-08 NOTE — Telephone Encounter (Addendum)
Patient requested refill on temazepam (RESTORIL) 30 MG capsule and oxyCODONE-acetaminophen (PERCOCET/ROXICET) 5-325 MG.  He last had his pain medication filled on 11/08/13.  He has started seeing Dr. Wendi Snipes as his pcp.  He has an apt with Dr. Wendi Snipes on 12/21/13.  Called his office to make sure percocet was not filled.  It was not filled due to him being a new patient.  Notified Dr. Sondra Come and medication was refilled.  Called Mr. Hopfensperger back to let him know that the scripts are available for pick up at the nursing station.

## 2013-12-21 ENCOUNTER — Ambulatory Visit: Payer: Self-pay | Admitting: Family Medicine

## 2014-01-09 ENCOUNTER — Other Ambulatory Visit: Payer: Self-pay | Admitting: Radiation Oncology

## 2014-01-09 ENCOUNTER — Telehealth: Payer: Self-pay | Admitting: Oncology

## 2014-01-09 MED ORDER — OXYCODONE-ACETAMINOPHEN 5-325 MG PO TABS: 1.0000 | ORAL_TABLET | ORAL | Status: DC | PRN

## 2014-01-09 NOTE — Telephone Encounter (Signed)
Martin [name] called and requested a refill on oxyCODONE-acetaminophen (PERCOCET/ROXICET) 5-325 MG per tablets.  He said he ran out on Saturday.  He said that he has not been approved for the orange card through his primary care doctor and can't afford the $140 charge for an office visit.  He reports that he is still having pain in his lower abdomen.  He also is having bleeding with bowel movements that "turns the toilet water red."  Informed him that I would call him back after discussing with Dr. Sondra Come after 3:00 today.

## 2014-01-12 ENCOUNTER — Ambulatory Visit
Admission: RE | Admit: 2014-01-12 | Discharge: 2014-01-12 | Disposition: A | Discharge status: Home / Self Care | Payer: No Typology Code available for payment source | Source: Ambulatory Visit | Attending: Radiation Oncology | Admitting: Radiation Oncology

## 2014-01-12 ENCOUNTER — Telehealth: Payer: Self-pay | Admitting: Oncology

## 2014-01-12 ENCOUNTER — Encounter: Payer: Self-pay | Admitting: Radiation Oncology

## 2014-01-12 VITALS — BP 127/75 | HR 81 | Temp 97.5°F | Resp 20 | Wt 147.4 lb

## 2014-01-12 DIAGNOSIS — C210 Malignant neoplasm of anus, unspecified: Secondary | ICD-10-CM

## 2014-01-12 NOTE — Progress Notes (Signed)
Follow up s/p rad tx anal over 3 years, only meds taking is percocet and restoril  , pain mild at present, had blood in stools last week has resolved, bowel movements, sofr formed regular now, increased frequency/urgency voiding, no dysuria, Viagra mad him drunk, stopped tqking also too expensive, drinks ensure between meals, 11:36 AM

## 2014-01-12 NOTE — Telephone Encounter (Signed)
Called Martin [name] to see if he is coming to his follow up appointment.  He said his mother in law had to have emergency surgery and he will not be able to come today.  He said he would call to reschedule.

## 2014-01-12 NOTE — Progress Notes (Signed)
Radiation Oncology         [phone] ________________________________  Name: Martin [name] MRN: [id number]  Date: 01/12/2014  DOB: [birth year]  Follow-Up Visit Note  CC: Kenn File, MD  Earnstine Regal, MD  Diagnosis:   Anal carcinoma  Interval Since Last Radiation:  2 years   Narrative:  The patient returns today for routine follow-up.  He continues to have pain deep within the pelvis area. He takes between 2 and 3 Percocet per day for this issue. His only other medication at this time is Restoril. Several days ago he had an episode of rectal bleeding but none since.  He denies any pain with bowel movements or decrease in stool caliber.  He continues to have problems with medical insurance. Patient was given Viagra for his erectile dysfunction by primary care. This made the patient feel  "woozy"  And he does not wish to continue using this medication.                           ALLERGIES:  is allergic to aspirin; codeine; and morphine and related.  Meds: Current Outpatient Prescriptions  Medication Sig Dispense Refill  . oxyCODONE-acetaminophen (PERCOCET/ROXICET) 5-325 MG per tablet Take 1 tablet by mouth every 4 (four) hours as needed.  100 tablet  0  . temazepam (RESTORIL) 30 MG capsule Take 1 capsule (30 mg total) by mouth at bedtime as needed for sleep.  30 capsule  0  . acetaminophen (TYLENOL) 500 MG tablet Take 2,000 mg by mouth every 6 (six) hours as needed for pain.      . Aspirin-Salicylamide-Caffeine (BC FAST PAIN RELIEF) 650-195-33.3 MG PACK Take 1 packet by mouth every 6 (six) hours as needed (for pain).      . citalopram (CELEXA) 20 MG tablet Take 1 tablet (20 mg total) by mouth daily.  30 tablet  0  . docusate sodium (COLACE) 100 MG capsule Take 100-200 mg by mouth 2 (two) times daily as needed for constipation.      Marland Kitchen emollient (BIAFINE) cream Apply 170 g topically 2 (two) times daily as needed. Radiation burns      . HYDROcodone-acetaminophen (NORCO/VICODIN)  5-325 MG per tablet Take 1-2 tablets by mouth every 6 (six) hours as needed for pain.  15 tablet  0  . ibuprofen (ADVIL,MOTRIN) 200 MG tablet Take 200 mg by mouth every 6 (six) hours as needed for pain.      . naproxen (NAPROSYN) 500 MG tablet Take 1 tablet (500 mg total) by mouth 2 (two) times daily with a meal.  30 tablet  0  . naproxen sodium (ANAPROX) 220 MG tablet Take 440 mg by mouth 2 (two) times daily as needed. For pain.      . sildenafil (VIAGRA) 100 MG tablet Take 0.5-1 tablets (50-100 mg total) by mouth daily as needed for erectile dysfunction.  5 tablet  11  . sulfamethoxazole-trimethoprim (SEPTRA DS) 800-160 MG per tablet Take 1 tablet by mouth every 12 (twelve) hours.  14 tablet  0  . traMADol (ULTRAM) 50 MG tablet Take 50 mg by mouth every 6 (six) hours as needed for pain.       No current facility-administered medications for this encounter.    Physical Findings: The patient is in no acute distress. Patient is alert and oriented.  weight is 147 lb 6.4 oz (66.86 kg). His oral temperature is 97.5 F (36.4 C). His blood pressure is 127/75  and his pulse is 81. His respiration is 20. .  The palpable supraclavicular or axillary adenopathy. The lungs are clear to auscultation. The heart has a regular rhythm and rate. The abdomen is soft and nontender with normal bowel sounds. There is no inguinal adenopathy. No signs of hernia. The testicles are soft without masses. On rectal examination sphincter tone is noted to be normal. There no masses palpated in the anal canal or lower rectal region. No skin lesions noted in the perianal area  Lab Findings: Lab Results  Component Value Date   WBC 11.1* 10/06/2013   HGB 14.5 10/06/2013   HCT 42.1 10/06/2013   MCV 96.8 10/06/2013   PLT 157 10/06/2013     Radiographic Findings: No results found.  Impression:  No evidence of recurrence on clinical exam today. Chronic pain deep within the pelvis requiring narcotics.  Plan:  Routine followup in  6 months. Financial counseling and social work to have been requested for followup today if possible.  ____________________________________ Blair Promise, MD

## 2014-01-12 NOTE — Telephone Encounter (Signed)
Called Eberardo to tell him that he can call Pamala Hurry with family practice at [phone] to make an appointment to set up his orange discount card.  [place] verbalized agreement.

## 2014-02-06 ENCOUNTER — Telehealth: Payer: Self-pay | Admitting: Oncology

## 2014-02-06 NOTE — Telephone Encounter (Signed)
Martin [name] left a message requesting a refill on percocet.  Called him back and he said he has 4 tablets left.  He is taking 3-4 tablets a day for lower abdominal pain.  He said he is still noticing blood in his stools occasionally.  He said he has tried to get approved for the "orange" card from family practice.  He said he has called multiple times and has not heard from them.

## 2014-02-07 ENCOUNTER — Other Ambulatory Visit: Payer: Self-pay | Admitting: Radiation Oncology

## 2014-02-07 MED ORDER — OXYCODONE-ACETAMINOPHEN 5-325 MG PO TABS: 1.0000 | ORAL_TABLET | ORAL | Status: DC | PRN

## 2014-03-06 ENCOUNTER — Telehealth: Payer: Self-pay | Admitting: Oncology

## 2014-03-06 ENCOUNTER — Other Ambulatory Visit: Payer: Self-pay | Admitting: Radiation Oncology

## 2014-03-06 MED ORDER — OXYCODONE-ACETAMINOPHEN 5-325 MG PO TABS: 1.0000 | ORAL_TABLET | ORAL | Status: DC | PRN

## 2014-03-06 NOTE — Telephone Encounter (Signed)
Martin [name] called and requested a refill on Percocet.  He said he has a few left and is going out of town Wednesday.

## 2014-03-07 ENCOUNTER — Telehealth: Payer: Self-pay | Admitting: Oncology

## 2014-03-07 NOTE — Telephone Encounter (Signed)
Called Martin [name] to let him know his percocet script is ready for pick up in nursing.  He verbalized agreement.

## 2014-04-03 ENCOUNTER — Telehealth: Payer: Self-pay | Admitting: Oncology

## 2014-04-03 NOTE — Telephone Encounter (Signed)
Meade called and requested a refill on oxyCODONE-acetaminophen (PERCOCET/ROXICET) 5-325 MG tablets.  He last had them filled on 03/06/14.

## 2014-04-04 ENCOUNTER — Other Ambulatory Visit: Payer: Self-pay | Admitting: Radiation Oncology

## 2014-04-04 ENCOUNTER — Telehealth: Payer: Self-pay | Admitting: Oncology

## 2014-04-04 DIAGNOSIS — C210 Malignant neoplasm of anus, unspecified: Secondary | ICD-10-CM

## 2014-04-04 MED ORDER — OXYCODONE-ACETAMINOPHEN 5-325 MG PO TABS: 1.0000 | ORAL_TABLET | ORAL | Status: DC | PRN

## 2014-04-04 NOTE — Telephone Encounter (Signed)
Called Martin [name] to let him know that his percocet refill is available for pick up.  Martin [name] verbalized agreement.

## 2014-04-04 NOTE — Telephone Encounter (Signed)
Dempsy called about the refill for percocet.  He said he has been having more pain in his groin.  He is taking 3-4 tablets of percocet per day.  He has 6 tablets left.

## 2014-05-02 ENCOUNTER — Telehealth: Payer: Self-pay | Admitting: Oncology

## 2014-05-02 NOTE — Telephone Encounter (Signed)
Martin [name] left a message asking for a refill on oxyCODONE-acetaminophen (PERCOCET/ROXICET) 5-325 MG per tablet 100 tablet 0 04/04/2014 Sig - Route: Take 1 tablet by mouth every 4 (four) hours as needed.  He last had it filled on 04/04/14.

## 2014-05-03 ENCOUNTER — Other Ambulatory Visit: Payer: Self-pay | Admitting: Radiation Oncology

## 2014-05-03 MED ORDER — OXYCODONE-ACETAMINOPHEN 5-325 MG PO TABS: 1.0000 | ORAL_TABLET | ORAL | Status: DC | PRN

## 2014-05-08 ENCOUNTER — Encounter (HOSPITAL_COMMUNITY): Payer: Self-pay | Admitting: Emergency Medicine

## 2014-05-08 ENCOUNTER — Emergency Department (HOSPITAL_COMMUNITY)
Admission: EM | Admit: 2014-05-08 | Discharge: 2014-05-08 | Disposition: A | Discharge status: Home / Self Care | Payer: No Typology Code available for payment source | Attending: Emergency Medicine | Admitting: Emergency Medicine

## 2014-05-08 DIAGNOSIS — Z8673 Personal history of transient ischemic attack (TIA), and cerebral infarction without residual deficits: Secondary | ICD-10-CM | POA: Insufficient documentation

## 2014-05-08 DIAGNOSIS — K089 Disorder of teeth and supporting structures, unspecified: Secondary | ICD-10-CM | POA: Insufficient documentation

## 2014-05-08 DIAGNOSIS — I10 Essential (primary) hypertension: Secondary | ICD-10-CM | POA: Insufficient documentation

## 2014-05-08 DIAGNOSIS — Z7982 Long term (current) use of aspirin: Secondary | ICD-10-CM | POA: Insufficient documentation

## 2014-05-08 DIAGNOSIS — Z8739 Personal history of other diseases of the musculoskeletal system and connective tissue: Secondary | ICD-10-CM | POA: Insufficient documentation

## 2014-05-08 DIAGNOSIS — Z792 Long term (current) use of antibiotics: Secondary | ICD-10-CM | POA: Insufficient documentation

## 2014-05-08 DIAGNOSIS — Z85048 Personal history of other malignant neoplasm of rectum, rectosigmoid junction, and anus: Secondary | ICD-10-CM | POA: Insufficient documentation

## 2014-05-08 DIAGNOSIS — M129 Arthropathy, unspecified: Secondary | ICD-10-CM | POA: Insufficient documentation

## 2014-05-08 DIAGNOSIS — K0889 Other specified disorders of teeth and supporting structures: Secondary | ICD-10-CM

## 2014-05-08 DIAGNOSIS — F17200 Nicotine dependence, unspecified, uncomplicated: Secondary | ICD-10-CM | POA: Insufficient documentation

## 2014-05-08 DIAGNOSIS — K029 Dental caries, unspecified: Secondary | ICD-10-CM | POA: Insufficient documentation

## 2014-05-08 DIAGNOSIS — Z8719 Personal history of other diseases of the digestive system: Secondary | ICD-10-CM | POA: Insufficient documentation

## 2014-05-08 DIAGNOSIS — F411 Generalized anxiety disorder: Secondary | ICD-10-CM | POA: Insufficient documentation

## 2014-05-08 MED ORDER — PENICILLIN V POTASSIUM 500 MG PO TABS: 500.0000 mg | ORAL_TABLET | Freq: Four times a day (QID) | ORAL | Status: AC

## 2014-05-08 NOTE — ED Notes (Signed)
Pt states he is having tooth pain on the right side but it goes around to the other side as well. Has bad teeth and no insurance. Has been taking some amoxicillin his daughter had.

## 2014-05-08 NOTE — ED Notes (Signed)
Care management in to speak with pt.

## 2014-05-08 NOTE — Discharge Instructions (Signed)
Read the information below.  Use the prescribed medication as directed.  Please discuss all new medications with your pharmacist.  You may return to the Emergency Department at any time for worsening condition or any new symptoms that concern you.  Please call the dentist listed above within 48 hours to schedule a close follow up appointment.  If you develop fevers, swelling in your face, difficulty swallowing or breathing, return to the ER immediately for a recheck.    Dental Pain Toothache is pain in or around a tooth. It may get worse with chewing or with cold or heat.  HOME CARE  Your dentist may use a numbing medicine during treatment. If so, you may need to avoid eating until the medicine wears off. Ask your dentist about this.  Only take medicine as told by your dentist or doctor.  Avoid chewing food near the painful tooth until after all treatment is done. Ask your dentist about this. GET HELP RIGHT AWAY IF:   The problem gets worse or new problems appear.  You have a fever.  There is redness and puffiness (swelling) of the face, jaw, or neck.  You cannot open your mouth.  There is pain in the jaw.  There is very bad pain that is not helped by medicine. MAKE SURE YOU:   Understand these instructions.  Will watch your condition.  Will get help right away if you are not doing well or get worse. Document Released: 02/11/2008 Document Revised: 11/17/2011 Document Reviewed: 02/11/2008 [facility] Patient Information 2015 [place], [place]. This information is not intended to replace advice given to you by your health care provider. Make sure you discuss any questions you have with your health care provider.   Emergency Department Resource Guide 1) Find a Doctor and Pay Out of Pocket Although you won't have to find out who is covered by your insurance plan, it is a good idea to ask around and get recommendations. You will then need to call the office and see if the doctor you have  chosen will accept you as a new patient and what types of options they offer for patients who are self-pay. Some doctors offer discounts or will set up payment plans for their patients who do not have insurance, but you will need to ask so you aren't surprised when you get to your appointment.  2) Contact Your Local Health Department Not all health departments have doctors that can see patients for sick visits, but many do, so it is worth a call to see if yours does. If you don't know where your local health department is, you can check in your phone book. The CDC also has a tool to help you locate your state's health department, and many state websites also have listings of all of their local health departments.  3) Find a [facility] If your illness is not likely to be very severe or complicated, you may want to try a walk in clinic. These are popping up all over the country in pharmacies, drugstores, and shopping centers. They're usually staffed by nurse practitioners or physician assistants that have been trained to treat common illnesses and complaints. They're usually fairly quick and inexpensive. However, if you have serious medical issues or chronic medical problems, these are probably not your best option.  No Primary Care Doctor: - Call Health Connect at  [phone] - they can help you locate a primary care doctor that  accepts your insurance, provides certain services, etc. - Physician  Referral Service- [phone]  Chronic Pain Problems: Organization         Address  Phone   Notes  [facility]  [phone] Patients need to be referred by their primary care doctor.   Medication Assistance: Organization         Address  Phone   Notes  [facility] Medication [facility] [address] [place], [place] [address] [phone] --Must be a resident of [facility] -- Must have NO insurance coverage whatsoever (no Medicaid/ Medicare,  etc.) -- The pt. MUST have a primary care doctor that directs their care regularly and follows them in the community   [facility]ist  [phone]   [employer]  [phone]    Agencies that provide inexpensive medical care: Organization         Address  Phone   Notes  [place]  [phone]   [facility]    [phone]   [facility] [place], [place] [address] [phone]   [place] [address], [place] [phone]   Planned Parenthood    [phone]   [facility]    [phone]   [place] and [place] [address], [place] Phone:  [phone], Fax:  [phone] Hours of Operation:  9 am - 6 pm, M-F.  Also accepts Medicaid/Medicare and self-pay.  [facility] for [place], [address], [place] Phone: [phone], Fax: [phone]. Hours of Operation:  8:30 am - 5:30 pm, M-F.  Also accepts Medicaid and self-pay.  [facility] [place] [address] [place] Phone: [phone]   [place], [place], [place] [phone], Ext. [phone] Mondays & Thursdays: 7-9 AM.  First 15 patients are seen on a first come, first serve basis.    [place] Providers:  Organization         Address  Phone   Notes  [facility] [address], [place] [phone] Also accepts self-pay patients.  [facility] [address], [place]  [phone]   [place], [address], [place] [phone]   [facility] [address], [place] [phone]   Lucianne Lei [address], [place]   [phone] Only accepts [place] Access [place] patients after they have their name applied to their card.   Self-Pay (no  insurance) in [facility]:  Organization         Address  Phone   Notes  Sickle Cell Patients, [facility] [place] [phone]   [facility] Urgent Care [place] [phone]   [facility] [place]  [place], [place], [place] [phone]   [facility]/Dr. Osei-Bonsu  [address], [place] or [address], [place] [phone] Phone number for both [place] and [place] locations is the same.  [facility]gent [facility] and [facility] [address] [place] [phone]   [facility] [address], [place] or [address] [phone]   [facility] [place], [place] [phone], phone; [phone], fax Sees patients 1st and 3rd Saturday of every month.  Must not qualify for public or private insurance (i.e. Medicaid, Medicare, [place] Health Choice, Veterans' Benefits)  Household income should be no more than 200% of the poverty level The clinic cannot treat you if you are pregnant or think you are pregnant  Sexually transmitted diseases are not treated at the clinic.    Dental Care: Organization         Address  Phone  Notes  [facility] Department of [facility] [phone] Accepts children up to age 50 who are enrolled in [place] or [place]; pregnant women with a Medicaid card; and children who have applied for Medicaid or [place] Health Choice, but were declined, whose parents can pay a reduced fee at time of service.  [facility] Department of [facility]  [address], [place] [phone] Accepts children up to age 98 who are enrolled in [place] or [place]; pregnant women with a Medicaid card; and children who have applied for Medicaid or [place] Health Choice, but were  declined, whose parents can pay a reduced fee at time of service.  [place] Adult Dental Access PROGRAM  [place] [phone] Patients are seen by appointment only. Walk-ins are not accepted. [place] will see patients 65 years of age and older. Monday - Tuesday (8am-5pm) Most Wednesdays (8:30-5pm) $30 per visit, cash only  [facility] Adult Dental Access PROGRAM  [address], [facility] [phone] Patients are seen by appointment only. Walk-ins are not accepted. [place] will see patients 53 years of age and older. One Wednesday Evening (Monthly: Volunteer Based).  $30 per visit, cash only  [place]  [phone] for adults; Children under age 51, call Graduate Pediatric Dentistry at [phone]. Children aged 7-14, please call [phone] to request a pediatric application.  Dental services are provided in all areas of dental care including fillings, crowns and bridges, complete and partial dentures, implants, gum treatment, root canals, and extractions. Preventive care is also provided. Treatment is provided to both adults and children. Patients are selected via a lottery and there is often a waiting list.   [facility] [address], [place]  [phone] [email or web address]   [facility] [address] [place], [place] [phone], Ext. [phone] Second and Fourth Thursday of each month, opens at 6:30 AM; Clinic ends at 9 AM.  Patients are seen on a first-come first-served basis, and a limited number are seen during each clinic.   [facility]  [address] Hillard Danker [place], [place] [phone]   Eligibility Requirements You must have lived in [place], [place], or [place] counties for at least the last three months.   You cannot be eligible for state or federal sponsored [employer], including [employer], [place], or [employer].   You generally cannot be  eligible for healthcare insurance through your employer.    How to apply: Eligibility screenings are held every Tuesday and Wednesday afternoon from 1:00 pm until 4:00 pm. You do not need an appointment for the interview!  [facility] [address] [place], [place]   [place]  [place] Department  [place]  [phone]    Behavioral Health Resources in the  Community: Intensive Outpatient Dispensing optician         Address  Phone  Notes  [place]. [address] [place], [place] [phone]   [facility] Outpatient [address] [place], [place]   ADS: Alcohol & Drug Svcs [address] [place], [place]   [place] [address]  [place], [place] or [phone]   Substance Abuse Resources Organization         Address  Phone  Notes  Alcohol and Drug Services  [phone]   [place]  [phone]   The [place]   Chinita Pester  [phone]   Residential & Outpatient Substance Abuse Program  [phone]   Psychological Services Organization         Address  Phone  Notes  [facility]  [place]  [phone]   [place] [address], [place] or [phone]    Mobile Crisis Teams Organization         Address  Phone  Notes  [facility], Mobile Crisis Care Unit  [phone]   [facility]  [address] [place],  [place]   [place] [address] [place] [phone]    Self-Help/Support Groups Organization         Address  Phone             Notes  [place]. of [place] - variety of support groups  [place] Call for more information    Narcotics Anonymous (NA), [facility] [address], [employer] [place]  2 meetings at this location   Special educational needs teacher         Address  Phone  Notes  ASAP Residential Treatment [place],    [place]  [phone]   [facility]  [address], [place] [address], [place], [place]   [place], [place] [phone] Admissions: 8am-3pm M-F  Incentives Substance [facility] [address],    [place], [place] [phone]   [facility] [address] [place], [place], [place]   The  [facility] [address],  [place], [place]   [facility] Programs - Intensive Outpatient [address] Kristeen [address], [place], [place]   [facility] [address]  [place], [place] [phone] or [phone]   [facility] Services (RTS) [address] [place], [place] Accepts Medicaid  [facility] [place] [address],  [place] [phone] Substance Abuse/Addiction Treatment   [facility] Organization         Address  Phone  Notes  [facility]  [phone]   Domenic Schwab, PhD [address] [place]lis [place], [place]   [phone] or [phone]   [facility] [place], [place] [phone]   [place] [address] [place], [place] [phone] Insurance/Medicaid/sponsorship through [employer] and Families [address] [place]                                    [place], [place] [phone] [place] [address], [place] [phone]  Dr. Adele Schilder  [phone]   [facility] Dept. 1) [address] [place] 2) [place] 3)  [place] [address], [place] [phone]  [phone]   [place] [phone] or [phone] (After Hours)

## 2014-05-08 NOTE — ED Provider Notes (Signed)
  Medical screening examination/treatment/procedure(s) were performed by non-physician practitioner and as supervising physician I was immediately available for consultation/collaboration.   EKG Interpretation None       Varney Biles, MD 05/08/14 1100

## 2014-05-08 NOTE — ED Provider Notes (Signed)
CSN: [id number]     Arrival date & time 05/08/14  0909 History  This chart was scribed for non-physician practitioner, Clayton Bibles, PA-C,working with Varney Biles, MD, by Marlowe Kays, ED Scribe. This patient was seen in room TR06C/TR06C and the patient's care was started at 9:40 AM.  Chief Complaint  Patient presents with  . Dental Pain   Patient is a 53 y.o. male presenting with tooth pain. The history is provided by the patient. No language interpreter was used.  Dental Pain Associated symptoms: no facial swelling and no fever    HPI Comments:  Martin [name] is a 53 y.o. male who presents to the Emergency Department complaining of severe soreness of his right dentition and jaw that started 3-4 days ago. He states he feels as if he may be getting an abscess. Pt reports associated swelling of the face and gums of the lower left side. He does not specify one tooth in particular stating he has dental decay throughout. He reports he was part of a dental program but was kicked out because he missed an appt. He denies SOB, sore throat, fever or chills, CP, abdominal pain, nausea or vomiting.   Past Medical History  Diagnosis Date  . Inguinal hernia   . GERD (gastroesophageal reflux disease)   . Hypertension     EKG 12/12 EPIC   no  PCP- states increased lately but hasnt been diagnosed formally  . Pneumonia     as child, cough at present with no fever  . Hemorrhoid     internal  . 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 Allergy 2 2013  . Anxiety   . Arthritis     DDD lumbar, arthritis knees  . DJD (degenerative joint disease) of lumbar spine   . Radiation 11/19/11-01/08/12    5040 cGy 28 fx Pelvis and inguinal area   Past Surgical History  Procedure Laterality Date  . Transanal excision  10/14/2011    Dr.Todd Gerkin  . Surgical pathology   10/14/2011    squamous cell ca of anus  . Appendectomy      age 60 or 79  . Examination under anesthesia  10/14/2011     Procedure: EXAM UNDER ANESTHESIA;  Surgeon: Earnstine Regal, MD;  Location: [facility];  Service: General;  Laterality: N/A;  exam under anethesia, Excision of mass anal canal, 1.5cm  . Inguinal hernia repair  05/11/2012    Procedure: HERNIA REPAIR INGUINAL ADULT;  Surgeon: Earnstine Regal, MD;  Location: [place];  Service: General;  Laterality: Left;  left inguinal hernia repair with mesh   Family History  Problem Relation Age of Onset  . Colon cancer Father   . Asthma Father   . Cancer Father     prostate  . Hypertension Father   . Asthma Mother   . Hypertension Mother   . Stomach cancer Neg Hx   . Esophageal cancer Neg Hx    History  Substance Use Topics  . Smoking status: Current Every Day Smoker -- 0.30 packs/day for 41 years    Types: Cigarettes  . Smokeless tobacco: Never Used     Comment: Cutting back  . Alcohol Use: 3.6 oz/week    6 Cans of beer per week     Comment: weekend    6pk weekend, beer     Review of Systems  Constitutional: Negative for fever and chills.  HENT:  Positive for dental problem. Negative for facial swelling and sore throat.   Respiratory: Negative for shortness of breath.   Cardiovascular: Negative for chest pain.  Gastrointestinal: Negative for nausea, vomiting and abdominal pain.    Allergies  Aspirin; Codeine; and Morphine and related  Home Medications   Prior to Admission medications   Medication Sig Start Date End Date Taking? Authorizing Provider  acetaminophen (TYLENOL) 500 MG tablet Take 2,000 mg by mouth every 6 (six) hours as needed for pain.    Historical Provider, MD  Aspirin-Salicylamide-Caffeine (BC FAST PAIN RELIEF) 650-195-33.3 MG PACK Take 1 packet by mouth every 6 (six) hours as needed (for pain).    Historical Provider, MD  citalopram (CELEXA) 20 MG tablet Take 1 tablet (20 mg total) by mouth daily. 11/30/13   Timmothy Euler, MD  docusate sodium (COLACE) 100 MG capsule Take 100-200 mg by mouth 2 (two) times  daily as needed for constipation.    Historical Provider, MD  emollient (BIAFINE) cream Apply 170 g topically 2 (two) times daily as needed. Radiation burns    Historical Provider, MD  HYDROcodone-acetaminophen (NORCO/VICODIN) 5-325 MG per tablet Take 1-2 tablets by mouth every 6 (six) hours as needed for pain. 01/03/13   Antonietta Breach, PA-C  ibuprofen (ADVIL,MOTRIN) 200 MG tablet Take 200 mg by mouth every 6 (six) hours as needed for pain.    Historical Provider, MD  naproxen (NAPROSYN) 500 MG tablet Take 1 tablet (500 mg total) by mouth 2 (two) times daily with a meal. 01/03/13   Antonietta Breach, PA-C  naproxen sodium (ANAPROX) 220 MG tablet Take 440 mg by mouth 2 (two) times daily as needed. For pain.    Historical Provider, MD  oxyCODONE-acetaminophen (PERCOCET/ROXICET) 5-325 MG per tablet Take 1 tablet by mouth every 4 (four) hours as needed. 05/03/14   Blair Promise, MD  sildenafil (VIAGRA) 100 MG tablet Take 0.5-1 tablets (50-100 mg total) by mouth daily as needed for erectile dysfunction. 11/30/13   Timmothy Euler, MD  sulfamethoxazole-trimethoprim (SEPTRA DS) 800-160 MG per tablet Take 1 tablet by mouth every 12 (twelve) hours. 07/10/13   Glendell Docker, NP  temazepam (RESTORIL) 30 MG capsule Take 1 capsule (30 mg total) by mouth at bedtime as needed for sleep. 12/08/13   Blair Promise, MD  traMADol (ULTRAM) 50 MG tablet Take 50 mg by mouth every 6 (six) hours as needed for pain.    Historical Provider, MD   Triage Vitals: BP 173/85  Pulse 74  Temp(Src) 97.7 F (36.5 C) (Oral)  Resp 20  Ht 5' 10"  (1.778 m)  Wt 142 lb (64.411 kg)  BMI 20.37 kg/m2  SpO2 100% Physical Exam  Nursing note and vitals reviewed. Constitutional: He appears well-developed and well-nourished. No distress.  HENT:  Head: Normocephalic and atraumatic.  Mouth/Throat: Oropharynx is clear and moist and mucous membranes are normal.  Many molars have been pulled. Severe dental decay of remaining teeth.  No paratracheal  tenderness. No facial swelling.  Neck: Neck supple.  Pulmonary/Chest: Effort normal.  Lymphadenopathy:    He has no cervical adenopathy.  Neurological: He is alert.  Skin: He is not diaphoretic.    ED Course  Procedures (including critical care time) DIAGNOSTIC STUDIES: Oxygen Saturation is 100% on RA, normal by my interpretation.   COORDINATION OF CARE: 9:44 AM- Will prescribe Amoxicillin. Advised pt to take Percocet he is already prescribed as directed. Informed him to take OTC Ibuprofen in addition to the Percocet for breakthrough  pain. Will have case worker speak with him about setting up care with a dentist and PCP. Pt verbalizes understanding and agrees to plan.  Medications - No data to display  Labs Review Labs Reviewed - No data to display  Imaging Review No results found.   EKG Interpretation None      Patient gets #100 Percocet 5-325 monthly from Dr Gery Pray, last filled on 05/03/14.  MDM   Final diagnoses:  Pain, dental    Afebrile, nontoxic patient with new dental pain.  No obvious abscess.  No concerning findings on exam.  Doubt deep space head or neck infection.  Doubt Ludwig's angina.  D/C home with antibiotic and dental follow up.  Pt gets #100 percocet monthly.  Have advised he may add NSAID if he is able to take these. Discussed findings, treatment, and follow up  with patient.  Pt given return precautions.  Pt verbalizes understanding and agrees with plan.       I personally performed the services described in this documentation, which was scribed in my presence. The recorded information has been reviewed and is accurate.    Clayton Bibles, PA-C 05/08/14 1030

## 2014-05-08 NOTE — ED Notes (Signed)
Pt here for dental pain. States was in a program to have his teeth pulled but missed an appointment "so was kicked out".

## 2014-06-01 ENCOUNTER — Other Ambulatory Visit: Payer: Self-pay | Admitting: Radiation Oncology

## 2014-06-01 MED ORDER — OXYCODONE-ACETAMINOPHEN 5-325 MG PO TABS: 1.0000 | ORAL_TABLET | ORAL | Status: DC | PRN

## 2014-06-08 ENCOUNTER — Ambulatory Visit: Payer: Self-pay

## 2014-06-12 ENCOUNTER — Ambulatory Visit: Payer: No Typology Code available for payment source

## 2014-06-13 ENCOUNTER — Ambulatory Visit
Admission: RE | Admit: 2014-06-13 | Discharge: 2014-06-13 | Disposition: A | Discharge status: Home / Self Care | Payer: No Typology Code available for payment source | Source: Ambulatory Visit | Attending: Family Medicine | Admitting: Family Medicine

## 2014-06-13 ENCOUNTER — Encounter: Payer: Self-pay | Admitting: Family Medicine

## 2014-06-13 ENCOUNTER — Ambulatory Visit (INDEPENDENT_AMBULATORY_CARE_PROVIDER_SITE_OTHER): Payer: No Typology Code available for payment source | Admitting: Family Medicine

## 2014-06-13 ENCOUNTER — Ambulatory Visit: Payer: Self-pay | Admitting: Family Medicine

## 2014-06-13 VITALS — BP 124/70 | HR 74 | Temp 98.3°F | Wt 143.0 lb

## 2014-06-13 DIAGNOSIS — M25561 Pain in right knee: Secondary | ICD-10-CM

## 2014-06-13 DIAGNOSIS — M545 Low back pain: Secondary | ICD-10-CM

## 2014-06-13 DIAGNOSIS — M25562 Pain in left knee: Secondary | ICD-10-CM

## 2014-06-13 DIAGNOSIS — G894 Chronic pain syndrome: Secondary | ICD-10-CM

## 2014-06-13 LAB — COMPREHENSIVE METABOLIC PANEL
ALBUMIN: 4.2 g/dL (ref 3.5–5.2)
ALT: 12 U/L (ref 0–53)
AST: 14 U/L (ref 0–37)
Alkaline Phosphatase: 75 U/L (ref 39–117)
BUN: 20 mg/dL (ref 6–23)
CALCIUM: 9.1 mg/dL (ref 8.4–10.5)
CHLORIDE: 104 meq/L (ref 96–112)
CO2: 23 meq/L (ref 19–32)
Creat: 1.06 mg/dL (ref 0.50–1.35)
Glucose, Bld: 94 mg/dL (ref 70–99)
POTASSIUM: 4.6 meq/L (ref 3.5–5.3)
Sodium: 137 mEq/L (ref 135–145)
Total Bilirubin: 0.3 mg/dL (ref 0.2–1.2)
Total Protein: 6.8 g/dL (ref 6.0–8.3)

## 2014-06-13 NOTE — Patient Instructions (Signed)
Nice to meet you. Please go get the x-rays of your back and knees. Once you get these we can discuss treatment.   Back Pain, Adult Back pain is very common. The pain often gets better over time. The cause of back pain is usually not dangerous. Most people can learn to manage their back pain on their own.  HOME CARE   Stay active. Start with short walks on flat ground if you can. Try to walk farther each day.  Do not sit, drive, or stand in one place for more than 30 minutes. Do not stay in bed.  Do not avoid exercise or work. Activity can help your back heal faster.  Be careful when you bend or lift an object. Bend at your knees, keep the object close to you, and do not twist.  Sleep on a firm mattress. Lie on your side, and bend your knees. If you lie on your back, put a pillow under your knees.  Only take medicines as told by your doctor.  Put ice on the injured area.  Put ice in a plastic bag.  Place a towel between your skin and the bag.  Leave the ice on for 15-20 minutes, 03-04 times a day for the first 2 to 3 days. After that, you can switch between ice and heat packs.  Ask your doctor about back exercises or massage.  Avoid feeling anxious or stressed. Find good ways to deal with stress, such as exercise. GET HELP RIGHT AWAY IF:   Your pain does not go away with rest or medicine.  Your pain does not go away in 1 week.  You have new problems.  You do not feel well.  The pain spreads into your legs.  You cannot control when you poop (bowel movement) or pee (urinate).  Your arms or legs feel weak or lose feeling (numbness).  You feel sick to your stomach (nauseous) or throw up (vomit).  You have belly (abdominal) pain.  You feel like you may pass out (faint). MAKE SURE YOU:   Understand these instructions.  Will watch your condition.  Will get help right away if you are not doing well or get worse. Document Released: 02/11/2008 Document Revised:  11/17/2011 Document Reviewed: 12/27/2013 [facility] Patient Information 2015 [place], [place]. This information is not intended to replace advice given to you by your health care provider. Make sure you discuss any questions you have with your health care provider.

## 2014-06-15 ENCOUNTER — Encounter: Payer: Self-pay | Admitting: Family Medicine

## 2014-06-15 NOTE — Progress Notes (Signed)
Patient ID: Martin [name], male   DOB: 05-15-1961, 53 y.o.   MRN: [id number]  Martin Rumps, MD Phone: [phone]  Martin [name] is a 53 y.o. male who presents today for same day appointment.  Joint pain: patient states he has joint pain all over his body. States he feels as though they lock up. Specifically his back and knees bother him. Has been going on for 3-4 months. He has tried tylenol and BC powders with no benefit. His oncologist has him on percocet which helps some. He denies joint swelling. Notes it hurts the more he moves and is worse in the am and does not improve throughout the day. Denies fevers, urine or bowel issues, weakness, and saddle anesthesia. He has a history of rectal cancer.   Patient is a smoker.   ROS: Per HPI   Physical Exam Filed Vitals:   06/13/14 0928  BP: 124/70  Pulse: 74  Temp: 98.3 F (36.8 C)    Gen: Well NAD HEENT: PERRL,  MMM Lungs: CTABL Nl WOB Heart: RRR no MRG MSK: no mid line back tenderness, mild left low back paraspinous muscle tenderness, no swelling or erythema of bilateral knees, no joint line tenderness bilateral knees, no ligamentous laxity, negative mcmurray bilateral knees, no apparent joint swelling Skin: no finger nail changes Exts: Non edematous BL  LE, warm and well perfused.    Assessment/Plan: Please see individual problem list.   Martin Rumps, MD [place] PGY-3

## 2014-06-15 NOTE — Assessment & Plan Note (Addendum)
Appears to have chronic joint pain at this time. Given history of cancer and now with back pain a lumbar spine film was obtained that revealed degenerative disc disease L5-S1 and no other noted abnormalities. Knee films revealed mild DJD. Will need to start on SNRI and refer to pain management as patient now has the orange card. F/u in one month with PCP.

## 2014-06-16 ENCOUNTER — Telehealth: Payer: Self-pay | Admitting: Family Medicine

## 2014-06-16 MED ORDER — VENLAFAXINE HCL ER 37.5 MG PO CP24: 37.5000 mg | ORAL_CAPSULE | Freq: Every day | ORAL | Status: DC

## 2014-06-16 NOTE — Addendum Note (Signed)
Addended by: Caryl Bis ERIC G on: 06/16/2014 05:57 PM   Modules accepted: Orders, Medications

## 2014-06-16 NOTE — Telephone Encounter (Signed)
Pt requesting xray results from 06/13/14, says he is still in a lot of pain.

## 2014-06-16 NOTE — Telephone Encounter (Signed)
Will forward this to provider who saw the patient

## 2014-06-16 NOTE — Telephone Encounter (Signed)
Called patient to discuss treatment options. After discussion patient decided on trying effexor. Will send in prescription for this medication. If this is too expensive will try gabapentin. Patient call back if this is not beneficial to schedule an appointment for f/u.

## 2014-06-16 NOTE — Telephone Encounter (Signed)
Please inform the patient that his x-rays revealed a mild arthritis in his back and in his knees. There are a couple of options. We could try him on an antiinflammatory medication called diclofenac to see if that is beneficial or we could start him on a medication for nerve pain. I would favor the medication for nerve pain given his history of reflux. I will send be happy to send in the nerve pain medication for the patient if he desires. Please advise the patient of this. Thanks.

## 2014-06-16 NOTE — Telephone Encounter (Signed)
Pt states that he is fine with starting a medication.  He would like it sent to [facility] on [address].  If it is too expensive than he might need a print out to shop around since he has the orange card. Charly Holcomb,CMA

## 2014-06-28 ENCOUNTER — Telehealth: Payer: Self-pay | Admitting: Oncology

## 2014-06-28 NOTE — Telephone Encounter (Signed)
Aryeh left a message requesting a refill on oxycodone/acetaminophen.

## 2014-06-29 ENCOUNTER — Other Ambulatory Visit: Payer: Self-pay | Admitting: Radiation Oncology

## 2014-06-29 ENCOUNTER — Telehealth: Payer: Self-pay | Admitting: Oncology

## 2014-06-29 MED ORDER — OXYCODONE-ACETAMINOPHEN 5-325 MG PO TABS: 1.0000 | ORAL_TABLET | ORAL | Status: DC | PRN

## 2014-06-29 NOTE — Telephone Encounter (Signed)
Martin [name] called about his pain medication refill.  He said he is using 3-4 tablets per day for the lower abdominal pain.  Advised him that Dr. Sondra Come would be notified and that he will be called back.  Martin [name] verbalized agreement.

## 2014-06-30 ENCOUNTER — Telehealth: Payer: Self-pay | Admitting: Oncology

## 2014-06-30 NOTE — Telephone Encounter (Signed)
Called Martin [name] and left him a message letting him know that his pain medication script is ready for pick up in the nursing area.

## 2014-07-13 ENCOUNTER — Encounter: Payer: Self-pay | Admitting: Radiation Oncology

## 2014-07-13 ENCOUNTER — Ambulatory Visit: Payer: No Typology Code available for payment source | Admitting: Nutrition

## 2014-07-13 ENCOUNTER — Ambulatory Visit
Admission: RE | Admit: 2014-07-13 | Discharge: 2014-07-13 | Disposition: A | Discharge status: Home / Self Care | Payer: No Typology Code available for payment source | Source: Ambulatory Visit | Attending: Radiation Oncology | Admitting: Radiation Oncology

## 2014-07-13 ENCOUNTER — Ambulatory Visit: Payer: Self-pay | Admitting: Radiation Oncology

## 2014-07-13 VITALS — BP 127/83 | HR 80 | Temp 98.0°F | Resp 16 | Ht 70.0 in | Wt 144.5 lb

## 2014-07-13 DIAGNOSIS — C210 Malignant neoplasm of anus, unspecified: Secondary | ICD-10-CM

## 2014-07-13 NOTE — Progress Notes (Signed)
Radiation Oncology         [phone] ________________________________  Name: Martin [name] MRN: [id number]  Date: 07/13/2014  DOB: 1961-05-25  Follow-Up Visit Note  CC: Kenn File, MD  Armandina Gemma, MD    ICD-9-CM ICD-10-CM   1. Anal cancer 154.3 C21.0     Diagnosis:   Anal carcinoma  Interval Since Last Radiation:  2-1/2 years   Narrative:  The patient returns today for routine follow-up.  He continues to have pain deep within the pelvis area for which he takes Percocet He had one episode of bleeding straining but no consistent rectal bleeding. Patient is also developed stiffness and pain in his joints. He is having management of this issue  through his primary care physician..                              ALLERGIES:  is allergic to aspirin; codeine; and morphine and related.  Meds: Current Outpatient Prescriptions  Medication Sig Dispense Refill  . acetaminophen (TYLENOL) 500 MG tablet Take 2,000 mg by mouth every 6 (six) hours as needed for pain.    Marland Kitchen docusate sodium (COLACE) 100 MG capsule Take 100-200 mg by mouth 2 (two) times daily as needed for constipation.    Marland Kitchen emollient (BIAFINE) cream Apply 170 g topically 2 (two) times daily as needed. Radiation burns    . oxyCODONE-acetaminophen (PERCOCET/ROXICET) 5-325 MG per tablet Take 1 tablet by mouth every 4 (four) hours as needed. 100 tablet 0  . Aspirin-Salicylamide-Caffeine (BC FAST PAIN RELIEF) 650-195-33.3 MG PACK Take 1 packet by mouth every 6 (six) hours as needed (for pain).    Marland Kitchen venlafaxine XR (EFFEXOR-XR) 37.5 MG 24 hr capsule Take 1 capsule (37.5 mg total) by mouth daily with breakfast. 30 capsule 3   No current facility-administered medications for this encounter.    Physical Findings: The patient is in no acute distress. Patient is alert and oriented.  height is 5' 10"  (1.778 m) and weight is 144 lb 8 oz (65.545 kg). His oral temperature is 98 F (36.7 C). His blood pressure is 127/83 and his pulse is  80. His respiration is 16. Marland Kitchen  No palpable supraclavicular or axillary adenopathy. Lungs are clear to auscultation. The heart has a regular rhythm and rate. The abdomen is soft and nontender with normal bowel sounds. No inguinal adenopathy. Patient has a scar in the left inguinal area from prior herniorrhaphy. The testicles are soft without masses. On rectal examination sphincter 10 is noted to be normal. The prostate is flat without nodularity. No palpable and no rectal masses are appreciated. Sphincter tone is good.  Some radiation changes noted in the perianal skin  Lab Findings: Lab Results  Component Value Date   WBC 11.1* 10/06/2013   HGB 14.5 10/06/2013   HCT 42.1 10/06/2013   MCV 96.8 10/06/2013   PLT 157 10/06/2013    Radiographic Findings: Dg Lumbar Spine Complete  06/13/2014   CLINICAL DATA:  Left-sided low back and left hip pain, no injury  EXAM: LUMBAR SPINE - COMPLETE 4+ VIEW  COMPARISON:  CT abdomen pelvis of 04/21/2012  FINDINGS: There is degenerative disc disease at L5-S1 where there is loss of disc space and sclerosis with mild spurring. The remainder of the intervertebral disc spaces appear normal. No compression deformity is seen. The SI joints are corticated.  IMPRESSION: Degenerative disc disease at L5-S1.  Normal alignment.   Electronically Signed  By: Ivar Drape M.D.   On: 06/13/2014 15:10   Dg Knee Bilateral Standing Ap  06/13/2014   CLINICAL DATA:  Knee pain and stiffness  EXAM: BILATERAL KNEES STANDING - 1 VIEW  COMPARISON:  None.  FINDINGS: The knee joint spaces appear relatively well preserved in the standing position with only slight loss of medial joint space. No fracture is seen. A knee joint effusion cannot be assessed without a lateral view.  IMPRESSION: Very minimal degenerative change with only slight loss of medial joint space in the standing position.   Electronically Signed   By: Ivar Drape M.D.   On: 06/13/2014 15:09    Impression:  No evidence recurrence  on clinical exam today  Plan:  Routine followup in 3 months.  ____________________________________ Blair Promise, MD

## 2014-07-13 NOTE — Progress Notes (Signed)
Martin [name] here for follow up after treatment for anal caner.  He reports pain at a 3/10 in his lower abdominal area.  He is taking 4-5 percocet per day.  He reports trying to get in to a pain clinic but they will not accept his insurance until May of next year.  He denies having diarrhea.  He has occasional constipation and takes colace as needed.  He reports having a small amount of rectal bleeding a week ago after straining.  He has lost 3 lbs since May and says food does not taste good to him.  He reports his energy level is good when he is not in pain.  He continues to smoke 1/2 a pack of cigarettes per day.  He reports irritation (small sores) on his rectal area that come and go.  He continues to use biafine on them.

## 2014-07-13 NOTE — Progress Notes (Signed)
Patient requesting samples of oral nutrition supplements.  Patient has received Ensure Plus in the past.  Briefly reviewed patient's chart and noted.  Patient has had continuous weight loss.  Provided patient with his second complementary case of Ensure Plus.   Also provided patient with coupons encouraged him to contact me for further questions or concerns.

## 2014-07-28 ENCOUNTER — Telehealth: Payer: Self-pay | Admitting: Oncology

## 2014-07-28 ENCOUNTER — Other Ambulatory Visit: Payer: Self-pay | Admitting: Radiation Oncology

## 2014-07-28 MED ORDER — OXYCODONE-ACETAMINOPHEN 5-325 MG PO TABS: 1.0000 | ORAL_TABLET | ORAL | Status: DC | PRN

## 2014-07-28 NOTE — Telephone Encounter (Signed)
Called [name] and let him know that his prescription is ready for pick up in the radiation nursing area.  Martin [name] verbalized agreement.

## 2014-07-28 NOTE — Telephone Encounter (Signed)
Martin [name] left a message requesting a refill on Percocet.  He last had it filled on 06/29/14.

## 2014-08-10 ENCOUNTER — Ambulatory Visit: Payer: No Typology Code available for payment source | Admitting: Family Medicine

## 2014-08-17 ENCOUNTER — Ambulatory Visit: Payer: No Typology Code available for payment source | Admitting: Family Medicine

## 2014-08-28 ENCOUNTER — Other Ambulatory Visit: Payer: Self-pay | Admitting: Radiation Oncology

## 2014-08-28 ENCOUNTER — Telehealth: Payer: Self-pay | Admitting: Oncology

## 2014-08-28 ENCOUNTER — Ambulatory Visit: Payer: No Typology Code available for payment source | Admitting: Family Medicine

## 2014-08-28 ENCOUNTER — Encounter: Payer: Self-pay | Admitting: Family Medicine

## 2014-08-28 MED ORDER — OXYCODONE-ACETAMINOPHEN 5-325 MG PO TABS: 1.0000 | ORAL_TABLET | ORAL | Status: DC | PRN

## 2014-08-28 NOTE — Telephone Encounter (Signed)
Martin [name] called and requested a refill on oxycodone/acetaminphen.  He said he is out of medication and he is in pain.  He reports taking 3-4 tablets daily for pain in his lower abdominal area.  Advised him that Dr. Sondra Come is out of the office today and that I will try to have another doctor fill the prescription.  Martin [name] verbalized agreement.

## 2014-08-28 NOTE — Telephone Encounter (Signed)
Called Martin [name] to let him know that his prescription for oxycodone/acetaminophen is ready for pick up in the Radiation nursing area.  Martin [name] verbalized agreement.

## 2014-09-13 ENCOUNTER — Ambulatory Visit (INDEPENDENT_AMBULATORY_CARE_PROVIDER_SITE_OTHER): Payer: No Typology Code available for payment source | Admitting: Family Medicine

## 2014-09-13 ENCOUNTER — Encounter: Payer: Self-pay | Admitting: Family Medicine

## 2014-09-13 VITALS — BP 144/89 | HR 83 | Temp 98.4°F | Ht 70.0 in | Wt 148.5 lb

## 2014-09-13 DIAGNOSIS — G894 Chronic pain syndrome: Secondary | ICD-10-CM

## 2014-09-13 DIAGNOSIS — G4700 Insomnia, unspecified: Secondary | ICD-10-CM

## 2014-09-13 DIAGNOSIS — M79606 Pain in leg, unspecified: Secondary | ICD-10-CM

## 2014-09-13 MED ORDER — TRAZODONE HCL 50 MG PO TABS: 50.0000 mg | ORAL_TABLET | Freq: Every evening | ORAL | Status: DC | PRN

## 2014-09-13 MED ORDER — GABAPENTIN 100 MG PO CAPS: 100.0000 mg | ORAL_CAPSULE | Freq: Three times a day (TID) | ORAL | Status: DC

## 2014-09-13 NOTE — Progress Notes (Signed)
Patient ID: Martin [name], male   DOB: Mar 05, 1961, 54 y.o.   MRN: [id number]   HPI  Patient presents today for  Leg pain and difficulty sleeping  Difficulty sleeping Patient states that he's had issues with this over the last year or so. Last 3 days has become very intense and he hasn't slept more than 2 hours in the last 3 days.  He notes acute life stressors with his girlfriend having a broken hip and her mother being in the hospital.  He also notes that his girlfriend uses antidepressants and he does not like how they affect her  So he is unwilling to try these at this time.  previously felt that he did not have any problems with his mood, now he feels that he may be anxious and easily irritable.  has tried Benadryl without help  Ascribes his sleep pattern is going to sleep around 9 2:10 PM, falling asleep in one hour but only going to sleep for 1 hour at a time within our back to bed each time, awakes about 5:30 in the morning with a total of 4-5 hours asleep intermittently throughout the night.    leg pain Notes intermittent leg pain worse after he's been walking. States that he feels that is exacerbated after a broken leg when he removed the cast too early. He feels that the bone never healed well.  He describes the pain as bilateral throbbing/aching type pain starting around his bilateral groin and  Radiating all the way down to his feet.  He also notes a burning type pain on his bilateral dorsal surfaces of his feet   he has intermittent low back pain which is not regularly associated with this leg pain.   Chronic narcotics-  These are filled by his oncologist and he feels that he does not have access to a pain clinic. I explained that we do not take on new chronic pain patients that he would have to  Pursue treatment at the pain clinic  Smoking status noted ROS: Per HPI  Objective: BP 144/89 mmHg  Pulse 83  Temp(Src) 98.4 F (36.9 C) (Oral)  Ht 5' 10"  (1.778 m)  Wt 148 lb 8  oz (67.359 kg)  BMI 21.31 kg/m2 Gen: NAD, alert, cooperative with exam HEENT: NCAT Ext: No edema, warm Neuro: Alert and oriented,   Strength 5/5 in bilateral lower extremities, 2+ patellar reflex,  Full strength with dorsi and plantar flexion of the great toe,  sensation intact bilateral lower extremities  MSK: No bony or paraspinal tenderness of his thoracic or lumbar spine  Assessment and plan:  Insomnia Chronic with an acute exacerbation due to life stressors.  Difficult to get a good Hx from him but it seems he has issues with multiople awakenings and staying asleep.  Trial of trazadone, f/u 3- 4 weeks, and investigate mood more deeply then as possible etiology.   Leg pain Unclear etiology but with burning feet and association with old injury likely neuropathic pain Trial of gabapentin 100-300 mg up to TID F/u 3-4 weeks.   Chronic pain syndrome  Several pain complaints including cancer related pain, intermittent low back pain, and bilateral leg pain  currently on narcotics filled by his oncologist Has an orange card and he is unsure of when he will be eligible to get coverage for pain clinic Will refer to pain clinic and ask my team to investigate     Meds ordered this encounter  Medications  . gabapentin (NEURONTIN)  100 MG capsule    Sig: Take 1 capsule (100 mg total) by mouth 3 (three) times daily.    Dispense:  90 capsule    Refill:  0  . traZODone (DESYREL) 50 MG tablet    Sig: Take 1-2 tablets (50-100 mg total) by mouth at bedtime as needed for sleep.    Dispense:  60 tablet    Refill:  1

## 2014-09-13 NOTE — Assessment & Plan Note (Signed)
Unclear etiology but with burning feet and association with old injury likely neuropathic pain Trial of gabapentin 100-300 mg up to TID F/u 3-4 weeks.

## 2014-09-13 NOTE — Assessment & Plan Note (Signed)
Chronic with an acute exacerbation due to life stressors.  Difficult to get a good Hx from him but it seems he has issues with multiople awakenings and staying asleep.  Trial of trazadone, f/u 3- 4 weeks, and investigate mood more deeply then as possible etiology.

## 2014-09-13 NOTE — Assessment & Plan Note (Signed)
Several pain complaints including cancer related pain, intermittent low back pain, and bilateral leg pain  currently on narcotics filled by his oncologist Has an orange card and he is unsure of when he will be eligible to get coverage for pain clinic Will refer to pain clinic and ask my team to investigate

## 2014-09-13 NOTE — Patient Instructions (Signed)
Great to see you!  Please come back in 3-4 weeks  Go to the [facility] program at [address] [place], [place], [place]

## 2014-09-25 ENCOUNTER — Telehealth: Payer: Self-pay | Admitting: Oncology

## 2014-09-25 NOTE — Telephone Encounter (Signed)
Martin [name] left a message requesting a refill on oxycodone/acetaminophen 5/325 mg.

## 2014-09-26 ENCOUNTER — Telehealth: Payer: Self-pay | Admitting: Oncology

## 2014-09-26 ENCOUNTER — Other Ambulatory Visit: Payer: Self-pay | Admitting: Radiation Oncology

## 2014-09-26 MED ORDER — OXYCODONE-ACETAMINOPHEN 5-325 MG PO TABS: 1.0000 | ORAL_TABLET | ORAL | Status: DC | PRN

## 2014-09-26 NOTE — Telephone Encounter (Signed)
Called Martin [name] and advising him his refill will be ready for pick up tomorrow morning in the nursing area.   [name] verbalized agreement.

## 2014-10-05 ENCOUNTER — Ambulatory Visit (INDEPENDENT_AMBULATORY_CARE_PROVIDER_SITE_OTHER): Payer: No Typology Code available for payment source | Admitting: Family Medicine

## 2014-10-05 ENCOUNTER — Encounter: Payer: Self-pay | Admitting: Family Medicine

## 2014-10-05 VITALS — BP 141/75 | HR 82 | Temp 98.3°F | Ht 70.0 in | Wt 152.0 lb

## 2014-10-05 DIAGNOSIS — M79606 Pain in leg, unspecified: Secondary | ICD-10-CM

## 2014-10-05 DIAGNOSIS — R059 Cough, unspecified: Secondary | ICD-10-CM

## 2014-10-05 DIAGNOSIS — R05 Cough: Secondary | ICD-10-CM

## 2014-10-05 DIAGNOSIS — N529 Male erectile dysfunction, unspecified: Secondary | ICD-10-CM

## 2014-10-05 DIAGNOSIS — K219 Gastro-esophageal reflux disease without esophagitis: Secondary | ICD-10-CM

## 2014-10-05 DIAGNOSIS — Z716 Tobacco abuse counseling: Secondary | ICD-10-CM

## 2014-10-05 MED ORDER — GABAPENTIN 300 MG PO CAPS: 300.0000 mg | ORAL_CAPSULE | Freq: Three times a day (TID) | ORAL | Status: DC

## 2014-10-05 MED ORDER — SILDENAFIL CITRATE 100 MG PO TABS: 50.0000 mg | ORAL_TABLET | Freq: Every day | ORAL | Status: DC | PRN

## 2014-10-05 MED ORDER — PANTOPRAZOLE SODIUM 20 MG PO TBEC: 20.0000 mg | DELAYED_RELEASE_TABLET | Freq: Every day | ORAL | Status: DC

## 2014-10-05 NOTE — Assessment & Plan Note (Signed)
Continued, not helped by gabapentin After discussion he would like to go up on the dosage of gabapentin Increased to 600 mg, discussion up to 900 mg 3 times daily

## 2014-10-05 NOTE — Progress Notes (Signed)
Patient ID: Martin [name], male   DOB: [birth year], 54 y.o.   MRN: [id number]   HPI  Patient presents today for follow-up  Leg pain Continued, not really helped by 300 mg of gabapentin Denies any somnolence or gabapentin After long discussion would like to go up on dose of gabapentin.  Insomnia Helped very much by trazodone, also these began working again which may be helping him sleep as well.  Tobacco, cough, GERD He smoked approximately 1 pack per day for the last 40 years or so Has had a cough productive of green sputum now for the last 3-4 weeks. He denies fever, malaise, decreased appetite, dyspnea, or feeling sick   States that he has GERD symptoms 3-4 times per week and previously did well on Pepcid or similar medicine. States that cough, throat clearing is worse in the morning.  Smoking status noted ROS: Per HPI  Objective: BP 141/75 mmHg  Pulse 82  Temp(Src) 98.3 F (36.8 C) (Oral)  Ht 5' 10"  (1.778 m)  Wt 152 lb (68.947 kg)  BMI 21.81 kg/m2 Gen: NAD, alert, cooperative with exam HEENT: NCAT, TMs WNL bilaterally, nares patent bilaterally with turbinates swollen slightly on the right, oropharynx clear MMM CV: RRR, good S1/S2, no murmur Resp: CTABL, no wheezes, non-labored  Assessment and plan:  Tobacco abuse counseling Continued to encourage  1 ppd for 40 + years   Leg pain Continued, not helped by gabapentin After discussion he would like to go up on the dosage of gabapentin Increased to 600 mg, discussion up to 900 mg 3 times daily   GERD (gastroesophageal reflux disease) Start PPI, also with cough Discussed 2+ months to resolve cough Start protonix, flexible with brand due to MAP program   Erectile dysfunction Helped by viagra, cost is prohibitive Refill   Cough Likely multifactorial, after exam and history today no discrete etiology Contributing factors likely GERD, nasal drip, possibly developing COPD  No dyspnea or wheezing to indicate  COPD exacerbation Oropharynx and nose not very impressive for postnasal drip With history of heartburn start treatment for GERD and possible GERD related cough Protonix, follow-up 4-6 weeks      Meds ordered this encounter  Medications  . gabapentin (NEURONTIN) 300 MG capsule    Sig: Take 1-3 capsules (300-900 mg total) by mouth 3 (three) times daily.    Dispense:  90 capsule    Refill:  3  . pantoprazole (PROTONIX) 20 MG tablet    Sig: Take 1 tablet (20 mg total) by mouth daily.    Dispense:  30 tablet    Refill:  5  . sildenafil (VIAGRA) 100 MG tablet    Sig: Take 0.5-1 tablets (50-100 mg total) by mouth daily as needed for erectile dysfunction.    Dispense:  5 tablet    Refill:  11

## 2014-10-05 NOTE — Assessment & Plan Note (Signed)
Start PPI, also with cough Discussed 2+ months to resolve cough Start protonix, flexible with brand due to MAP program

## 2014-10-05 NOTE — Assessment & Plan Note (Signed)
Helped by viagra, cost is prohibitive Refill

## 2014-10-05 NOTE — Patient Instructions (Signed)
Great to see you!  Lets follow up in 4-6 weeks  Your Rx for protonix (acid medicine) and gabapentin are at the [facility] program at the [facility] dept

## 2014-10-05 NOTE — Assessment & Plan Note (Signed)
Continued to encourage  1 ppd for 40 + years

## 2014-10-05 NOTE — Assessment & Plan Note (Signed)
Likely multifactorial, after exam and history today no discrete etiology Contributing factors likely GERD, nasal drip, possibly developing COPD  No dyspnea or wheezing to indicate COPD exacerbation Oropharynx and nose not very impressive for postnasal drip With history of heartburn start treatment for GERD and possible GERD related cough Protonix, follow-up 4-6 weeks

## 2014-10-06 ENCOUNTER — Telehealth: Payer: Self-pay | Admitting: *Deleted

## 2014-10-06 DIAGNOSIS — M79606 Pain in leg, unspecified: Secondary | ICD-10-CM

## 2014-10-06 MED ORDER — GABAPENTIN 300 MG PO CAPS: 300.0000 mg | ORAL_CAPSULE | Freq: Three times a day (TID) | ORAL | Status: DC

## 2014-10-06 MED ORDER — OMEPRAZOLE 20 MG PO CPDR: 20.0000 mg | DELAYED_RELEASE_CAPSULE | Freq: Every day | ORAL | Status: DC

## 2014-10-06 NOTE — Telephone Encounter (Signed)
Received a fax from [facility] stating that pt is not in the MAP.  They do not carry Protonix only omeprazole 20 mg.  Please advise?  If changing Rx to omeprazole please send to [facility], not [facility].  Derl Barrow, RN

## 2014-10-06 NOTE — Telephone Encounter (Signed)
[facility] also questioning the quantity of gabapentin 300 mg.  Pt taking 1-3 capsules TID, #90.  Please give them a call at [phone]. Derl Barrow, RN

## 2014-10-06 NOTE — Telephone Encounter (Signed)
Called and discussed the problems with the pharmacy Will use omeprazole instead of Protonix  Also change quantity of gabapentin to 1 pill 3 times a day, I discussed with the patient how to titrate the dose and he can get a refill if he increases the dose and has benefit.  Laroy Apple, MD [place] Resident, PGY-3 10/06/2014, 9:37 AM

## 2014-10-13 ENCOUNTER — Encounter (HOSPITAL_COMMUNITY): Payer: Self-pay

## 2014-10-13 ENCOUNTER — Emergency Department (HOSPITAL_COMMUNITY)
Admission: EM | Admit: 2014-10-13 | Discharge: 2014-10-13 | Disposition: A | Discharge status: Home / Self Care | Payer: No Typology Code available for payment source | Attending: Emergency Medicine | Admitting: Emergency Medicine

## 2014-10-13 DIAGNOSIS — Z720 Tobacco use: Secondary | ICD-10-CM | POA: Insufficient documentation

## 2014-10-13 DIAGNOSIS — K219 Gastro-esophageal reflux disease without esophagitis: Secondary | ICD-10-CM | POA: Insufficient documentation

## 2014-10-13 DIAGNOSIS — F419 Anxiety disorder, unspecified: Secondary | ICD-10-CM | POA: Insufficient documentation

## 2014-10-13 DIAGNOSIS — Z79899 Other long term (current) drug therapy: Secondary | ICD-10-CM | POA: Insufficient documentation

## 2014-10-13 DIAGNOSIS — Z85048 Personal history of other malignant neoplasm of rectum, rectosigmoid junction, and anus: Secondary | ICD-10-CM | POA: Insufficient documentation

## 2014-10-13 DIAGNOSIS — I10 Essential (primary) hypertension: Secondary | ICD-10-CM | POA: Insufficient documentation

## 2014-10-13 DIAGNOSIS — Z8701 Personal history of pneumonia (recurrent): Secondary | ICD-10-CM | POA: Insufficient documentation

## 2014-10-13 DIAGNOSIS — M1389 Other specified arthritis, multiple sites: Secondary | ICD-10-CM | POA: Insufficient documentation

## 2014-10-13 DIAGNOSIS — Z923 Personal history of irradiation: Secondary | ICD-10-CM | POA: Insufficient documentation

## 2014-10-13 DIAGNOSIS — Y9289 Other specified places as the place of occurrence of the external cause: Secondary | ICD-10-CM | POA: Insufficient documentation

## 2014-10-13 DIAGNOSIS — Y998 Other external cause status: Secondary | ICD-10-CM | POA: Insufficient documentation

## 2014-10-13 DIAGNOSIS — Y9389 Activity, other specified: Secondary | ICD-10-CM | POA: Insufficient documentation

## 2014-10-13 DIAGNOSIS — L02511 Cutaneous abscess of right hand: Secondary | ICD-10-CM | POA: Insufficient documentation

## 2014-10-13 DIAGNOSIS — W458XXA Other foreign body or object entering through skin, initial encounter: Secondary | ICD-10-CM | POA: Insufficient documentation

## 2014-10-13 MED ORDER — CEFAZOLIN SODIUM 1 G IJ SOLR: 1.0000 g | Freq: Once | INTRAMUSCULAR | Status: DC

## 2014-10-13 MED ORDER — BUPIVACAINE HCL (PF) 0.5 % IJ SOLN
20.0000 mL | Freq: Once | INTRAMUSCULAR | Status: AC
  Administered 2014-10-13: 20 mL
  Filled 2014-10-13: qty 30

## 2014-10-13 MED ORDER — DOXYCYCLINE HYCLATE 100 MG PO CAPS: 100.0000 mg | ORAL_CAPSULE | Freq: Two times a day (BID) | ORAL | Status: DC

## 2014-10-13 MED ORDER — HYDROMORPHONE HCL 1 MG/ML IJ SOLN
0.5000 mg | Freq: Once | INTRAMUSCULAR | Status: AC
  Administered 2014-10-13: 0.5 mg via INTRAVENOUS
  Filled 2014-10-13: qty 1

## 2014-10-13 MED ORDER — OXYCODONE HCL 5 MG PO TABS: 5.0000 mg | ORAL_TABLET | ORAL | Status: DC | PRN

## 2014-10-13 MED ORDER — CEFAZOLIN SODIUM 1-5 GM-% IV SOLN
1.0000 g | Freq: Once | INTRAVENOUS | Status: AC
  Administered 2014-10-13: 1 g via INTRAVENOUS
  Filled 2014-10-13: qty 50

## 2014-10-13 NOTE — ED Notes (Signed)
Pt had a splinter in his right middle finger for two days, he's tried to get the splinter out with a razor blade and his finger is now throbbing and swollen.

## 2014-10-13 NOTE — ED Provider Notes (Signed)
CSN: [id number]     Arrival date & time 10/13/14  4097 History   First MD Initiated Contact with Patient 10/13/14 0602     Chief Complaint  Patient presents with  . Finger Injury     (Consider location/radiation/quality/duration/timing/severity/associated sxs/prior Treatment) HPI   Right handed patient presents with painful, swollen right middle finger.  States he got a wooden splinter in his finger while laying floor boards, dug it out with his carpet knife, and then began having swelling and pain throughout the finger.  Decreased movement secondary to swelling.  Pain is throbbing, improved with elevation worse with general use.  He came to the ED because the pain became severe overnight.  Denies numbness/tingling, fevers, chills, myalgias.  Tetanus vx is up to date.   Past Medical History  Diagnosis Date  . Inguinal hernia   . GERD (gastroesophageal reflux disease)   . Hypertension     EKG 12/12 EPIC   no  PCP- states increased lately but hasnt been diagnosed formally  . Pneumonia     as child, cough at present with no fever  . Hemorrhoid     internal  . 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 Allergy 2 2013  . Anxiety   . Arthritis     DDD lumbar, arthritis knees  . DJD (degenerative joint disease) of lumbar spine   . Radiation 11/19/11-01/08/12    5040 cGy 28 fx Pelvis and inguinal area  . Pneumonia     as child, cough at present with no fever    Past Surgical History  Procedure Laterality Date  . Transanal excision  10/14/2011    Dr.Todd Gerkin  . Surgical pathology   10/14/2011    squamous cell ca of anus  . Appendectomy      age 1 or 42  . Examination under anesthesia  10/14/2011    Procedure: EXAM UNDER ANESTHESIA;  Surgeon: Earnstine Regal, MD;  Location: [facility];  Service: General;  Laterality: N/A;  exam under anethesia, Excision of mass anal canal, 1.5cm  . Inguinal hernia repair  05/11/2012    Procedure: HERNIA REPAIR INGUINAL ADULT;   Surgeon: Earnstine Regal, MD;  Location: [place];  Service: General;  Laterality: Left;  left inguinal hernia repair with mesh   Family History  Problem Relation Age of Onset  . Colon cancer Father   . Asthma Father   . Cancer Father     prostate  . Hypertension Father   . Asthma Mother   . Hypertension Mother   . Stomach cancer Neg Hx   . Esophageal cancer Neg Hx    History  Substance Use Topics  . Smoking status: Current Every Day Smoker -- 0.30 packs/day for 41 years    Types: Cigarettes  . Smokeless tobacco: Never Used     Comment: Cutting back  . Alcohol Use: 3.6 oz/week    6 Cans of beer per week     Comment: weekend    6pk weekend, beer     Review of Systems  Constitutional: Negative for fever and chills.  Gastrointestinal: Negative for nausea and vomiting.  Musculoskeletal: Negative for myalgias.  Skin: Positive for color change and wound.  Allergic/Immunologic: Negative for immunocompromised state.  Neurological: Negative for weakness and numbness.  Hematological: Does not bruise/bleed easily.  Psychiatric/Behavioral: Positive for self-injury.      Allergies  Aspirin; Codeine; and Morphine and related  Home Medications   Prior to Admission medications   Medication Sig Start Date End Date Taking? Authorizing Provider  acetaminophen (TYLENOL) 500 MG tablet Take 2,000 mg by mouth every 6 (six) hours as needed for pain.   Yes Historical Provider, MD  Aspirin-Salicylamide-Caffeine (BC FAST PAIN RELIEF) 650-195-33.3 MG PACK Take 1 packet by mouth every 6 (six) hours as needed (for pain).   Yes Historical Provider, MD  docusate sodium (COLACE) 100 MG capsule Take 100 mg by mouth 2 (two) times daily as needed for mild constipation.   Yes Historical Provider, MD  gabapentin (NEURONTIN) 300 MG capsule Take 1 capsule (300 mg total) by mouth 3 (three) times daily. 10/06/14  Yes Timmothy Euler, MD  naphazoline-pheniramine (NAPHCON-A) 0.025-0.3 %  ophthalmic solution Place 2 drops into both eyes 4 (four) times daily as needed for irritation.   Yes Historical Provider, MD  omeprazole (PRILOSEC) 20 MG capsule Take 1 capsule (20 mg total) by mouth daily. 10/06/14  Yes Timmothy Euler, MD  oxyCODONE-acetaminophen (PERCOCET/ROXICET) 5-325 MG per tablet Take 1 tablet by mouth every 4 (four) hours as needed. 09/26/14  Yes Blair Promise, MD  traZODone (DESYREL) 50 MG tablet Take 1-2 tablets (50-100 mg total) by mouth at bedtime as needed for sleep. 09/13/14  Yes Timmothy Euler, MD  sildenafil (VIAGRA) 100 MG tablet Take 0.5-1 tablets (50-100 mg total) by mouth daily as needed for erectile dysfunction. 10/05/14   Timmothy Euler, MD  venlafaxine XR (EFFEXOR-XR) 37.5 MG 24 hr capsule Take 1 capsule (37.5 mg total) by mouth daily with breakfast. Patient not taking: Reported on 10/13/2014 06/16/14   Leone Haven, MD   BP 150/82 mmHg  Pulse 94  Temp(Src) 97.9 F (36.6 C) (Oral)  Resp 20  Ht 5' 10"  (1.778 m)  Wt 150 lb (68.04 kg)  BMI 21.52 kg/m2  SpO2 99% Physical Exam  Constitutional: He appears well-developed and well-nourished. No distress.  HENT:  Head: Normocephalic and atraumatic.  Neck: Neck supple.  Pulmonary/Chest: Effort normal.  Musculoskeletal:       Hands: Neurological: He is alert.  Skin: He is not diaphoretic.  Nursing note and vitals reviewed.   ED Course  Procedures (including critical care time) Labs Review Labs Reviewed - No data to display  Imaging Review No results found.   EKG Interpretation None       Patient also seen and examined by Dr Sharol Given who agrees with hand surgery consultation for suspected abscess.    6:52 AM Discussed patient with Dr Lenon Curt who will see the patient in the ED.    MDM   Final diagnoses:  Abscess of finger, right    Afebrile nontoxic patient with infection to right middle finger after attempting to remove splinter on his own at home.  Tetanus up to date.  Seen by Dr  Lenon Curt in ED.  Please see his note for further detail.  D/C pt home per Dr Brennan Bailey instructions to nurse.  Discussed findings, treatment, and follow up  with patient.  Pt given return precautions.  Pt verbalizes understanding and agrees with plan.          Clayton Bibles, PA-C 10/13/14 1615  Kalman Drape, MD 10/14/14 [phone]

## 2014-10-13 NOTE — Discharge Instructions (Signed)
Read the information below.  Use the prescribed medication as directed.  Please discuss all new medications with your pharmacist.  You may return to the Emergency Department at any time for worsening condition or any new symptoms that concern you.  If you develop increased redness, swelling, are unable to bend your finger, uncontrolled pain, or fevers greater than 100.4, return to the ER immediately for a recheck.  Please follow up with Dr Lenon Curt as he directed.  Call his office immediately if you have questions or encounter any problems.     Abscess An abscess (boil or furuncle) is an infected area on or under the skin. This area is filled with yellowish-white fluid (pus) and other material (debris). HOME CARE   Only take medicines as told by your doctor.  If you were given antibiotic medicine, take it as directed. Finish the medicine even if you start to feel better.  If gauze is used, follow your doctor's directions for changing the gauze.  To avoid spreading the infection:  Keep your abscess covered with a bandage.  Wash your hands well.  Do not share personal care items, towels, or whirlpools with others.  Avoid skin contact with others.  Keep your skin and clothes clean around the abscess.  Keep all doctor visits as told. GET HELP RIGHT AWAY IF:   You have more pain, puffiness (swelling), or redness in the wound site.  You have more fluid or blood coming from the wound site.  You have muscle aches, chills, or you feel sick.  You have a fever. MAKE SURE YOU:   Understand these instructions.  Will watch your condition.  Will get help right away if you are not doing well or get worse. Document Released: 02/11/2008 Document Revised: 02/24/2012 Document Reviewed: 11/07/2011 [facility] Patient Information 2015 [place], [place]. This information is not intended to replace advice given to you by your health care provider. Make sure you discuss any questions you have with your  health care provider.  Abscess Care After An abscess (also called a boil or furuncle) is an infected area that contains a collection of pus. Signs and symptoms of an abscess include pain, tenderness, redness, or hardness, or you may feel a moveable soft area under your skin. An abscess can occur anywhere in the body. The infection may spread to surrounding tissues causing cellulitis. A cut (incision) by the surgeon was made over your abscess and the pus was drained out. Gauze may have been packed into the space to provide a drain that will allow the cavity to heal from the inside outwards. The boil may be painful for 5 to 7 days. Most people with a boil do not have high fevers. Your abscess, if seen early, may not have localized, and may not have been lanced. If not, another appointment may be required for this if it does not get better on its own or with medications. HOME CARE INSTRUCTIONS   Only take over-the-counter or prescription medicines for pain, discomfort, or fever as directed by your caregiver.  When you bathe, soak and then remove gauze or iodoform packs at least daily or as directed by your caregiver. You may then wash the wound gently with mild soapy water. Repack with gauze or do as your caregiver directs. SEEK IMMEDIATE MEDICAL CARE IF:   You develop increased pain, swelling, redness, drainage, or bleeding in the wound site.  You develop signs of generalized infection including muscle aches, chills, fever, or a general ill feeling.  An oral temperature above 102 F (38.9 C) develops, not controlled by medication. See your caregiver for a recheck if you develop any of the symptoms described above. If medications (antibiotics) were prescribed, take them as directed. Document Released: 03/13/2005 Document Revised: 11/17/2011 Document Reviewed: 11/08/2007 [facility] Patient Information 2015 [place], [place]. This information is not intended to replace advice given to you by your health  care provider. Make sure you discuss any questions you have with your health care provider.

## 2014-10-13 NOTE — Consult Note (Signed)
Reason for Consult:infection Referring Physician: PA [facility]  CC:I got a splinter in my finger  HPI:  Martin [name] is an 54 y.o. right handed male who presents with   Pain, swelling of RLF.  Pt got a splinter in finger ~2d ago, removed it and since then finger has become swollen and painful.  Able to flx finger.  Is carpenter and frequently gets splinters, cuts on fingers     .   Pain is rated at   6 /10 and is described as sharp/dull.  Pain is constant.  Pain is made better by rest/immobilization, worse with motion.   Associated signs/symptoms:denies fever, chills Previous treatment:    Past Medical History  Diagnosis Date  . Inguinal hernia   . GERD (gastroesophageal reflux disease)   . Hypertension     EKG 12/12 EPIC   no  PCP- states increased lately but hasnt been diagnosed formally  . Pneumonia     as child, cough at present with no fever  . Hemorrhoid     internal  . 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 Allergy 2 2013  . Anxiety   . Arthritis     DDD lumbar, arthritis knees  . DJD (degenerative joint disease) of lumbar spine   . Radiation 11/19/11-01/08/12    5040 cGy 28 fx Pelvis and inguinal area  . Pneumonia     as child, cough at present with no fever     Past Surgical History  Procedure Laterality Date  . Transanal excision  10/14/2011    Dr.Todd Gerkin  . Surgical pathology   10/14/2011    squamous cell ca of anus  . Appendectomy      age 41 or 29  . Examination under anesthesia  10/14/2011    Procedure: EXAM UNDER ANESTHESIA;  Surgeon: Earnstine Regal, MD;  Location: [facility];  Service: General;  Laterality: N/A;  exam under anethesia, Excision of mass anal canal, 1.5cm  . Inguinal hernia repair  05/11/2012    Procedure: HERNIA REPAIR INGUINAL ADULT;  Surgeon: Earnstine Regal, MD;  Location: [place];  Service: General;  Laterality: Left;  left inguinal hernia repair with mesh    Family History  Problem Relation Age of  Onset  . Colon cancer Father   . Asthma Father   . Cancer Father     prostate  . Hypertension Father   . Asthma Mother   . Hypertension Mother   . Stomach cancer Neg Hx   . Esophageal cancer Neg Hx     Social History:  reports that he has been smoking Cigarettes.  He has a 12.3 pack-year smoking history. He has never used smokeless tobacco. He reports that he drinks about 3.6 oz of alcohol per week. He reports that he uses illicit drugs (Marijuana).  Allergies:  Allergies  Allergen Reactions  . Aspirin Swelling  . Codeine Nausea Only  . Morphine And Related Itching    Medications: I have reviewed the patient's current medications.  No results found for this or any previous visit (from the past 48 hour(s)).  No results found.  Pertinent items are noted in HPI. Temp:  [97.9 F (36.6 C)] 97.9 F (36.6 C) (02/05 0543) Pulse Rate:  [65-94] 65 (02/05 0751) Resp:  [18-20] 18 (02/05 0751) BP: (118-150)/(67-82) 118/67 mmHg (02/05 0751) SpO2:  [97 %-99 %] 97 % (02/05 0751) Weight:  [68.04 kg (150  lb)] 68.04 kg (150 lb) (02/05 0543) General appearance: alert and cooperative Resp: clear to auscultation bilaterally Cardio: regular rate and rhythm GI: soft, non-tender; bowel sounds normal; no masses,  no organomegaly Extremities: RLF with subcutaneous small abscess over middle phalanx area, pt able to flex finger, not tender over prox tendon sheath   Assessment: Superficial abscess RLF Plan:  I&D, wash out, pack with small piece of iodoform gauze - pt to remove this evening and irrigate wound tid with saline, oral abx, return if finger worse, no better as may develop tenosynovitis. I have discussed this treatment plan in detail with patient  including the risks of the recommended treatment , the benefits and the alternatives.  The patient understands that additional treatment may be necessary.  I&D performed under local anesthetic, no obvious deep abscess, wound irrigated with  saline, packed with small piece of iodoform gauze and dressed.  Pt tolerated procedure well. Zandon Talton CHRISTOPHER 10/13/2014, 8:47 AM

## 2014-10-13 NOTE — ED Notes (Signed)
Bed: WA06 Expected date:  Expected time:  Means of arrival:  Comments: 

## 2014-10-13 NOTE — ED Notes (Signed)
Hand surgery at bedside.

## 2014-10-19 ENCOUNTER — Ambulatory Visit
Admission: RE | Admit: 2014-10-19 | Discharge: 2014-10-19 | Disposition: A | Discharge status: Home / Self Care | Payer: No Typology Code available for payment source | Source: Ambulatory Visit | Attending: Radiation Oncology | Admitting: Radiation Oncology

## 2014-10-19 ENCOUNTER — Telehealth: Payer: Self-pay | Admitting: *Deleted

## 2014-10-19 ENCOUNTER — Encounter: Payer: Self-pay | Admitting: Radiation Oncology

## 2014-10-19 VITALS — BP 167/102 | HR 96 | Temp 98.0°F | Resp 16 | Ht 70.0 in | Wt 151.9 lb

## 2014-10-19 DIAGNOSIS — C210 Malignant neoplasm of anus, unspecified: Secondary | ICD-10-CM

## 2014-10-19 DIAGNOSIS — M79605 Pain in left leg: Secondary | ICD-10-CM | POA: Insufficient documentation

## 2014-10-19 DIAGNOSIS — Z08 Encounter for follow-up examination after completed treatment for malignant neoplasm: Secondary | ICD-10-CM | POA: Insufficient documentation

## 2014-10-19 DIAGNOSIS — R102 Pelvic and perineal pain: Secondary | ICD-10-CM | POA: Insufficient documentation

## 2014-10-19 DIAGNOSIS — Z79899 Other long term (current) drug therapy: Secondary | ICD-10-CM | POA: Insufficient documentation

## 2014-10-19 DIAGNOSIS — N529 Male erectile dysfunction, unspecified: Secondary | ICD-10-CM | POA: Insufficient documentation

## 2014-10-19 DIAGNOSIS — Z85048 Personal history of other malignant neoplasm of rectum, rectosigmoid junction, and anus: Secondary | ICD-10-CM | POA: Insufficient documentation

## 2014-10-19 DIAGNOSIS — Z923 Personal history of irradiation: Secondary | ICD-10-CM | POA: Insufficient documentation

## 2014-10-19 LAB — CBC WITH DIFFERENTIAL/PLATELET
BASO%: 1.1 % (ref 0.0–2.0)
BASOS ABS: 0.1 10*3/uL (ref 0.0–0.1)
EOS%: 1.6 % (ref 0.0–7.0)
Eosinophils Absolute: 0.1 10*3/uL (ref 0.0–0.5)
HCT: 44.3 % (ref 38.4–49.9)
HEMOGLOBIN: 14.4 g/dL (ref 13.0–17.1)
LYMPH#: 1.7 10*3/uL (ref 0.9–3.3)
LYMPH%: 19.5 % (ref 14.0–49.0)
MCH: 31 pg (ref 27.2–33.4)
MCHC: 32.6 g/dL (ref 32.0–36.0)
MCV: 95.2 fL (ref 79.3–98.0)
MONO#: 0.4 10*3/uL (ref 0.1–0.9)
MONO%: 5 % (ref 0.0–14.0)
NEUT#: 6.5 10*3/uL (ref 1.5–6.5)
NEUT%: 72.8 % (ref 39.0–75.0)
Platelets: 191 10*3/uL (ref 140–400)
RBC: 4.66 10*6/uL (ref 4.20–5.82)
RDW: 13.4 % (ref 11.0–14.6)
WBC: 8.9 10*3/uL (ref 4.0–10.3)

## 2014-10-19 LAB — COMPREHENSIVE METABOLIC PANEL (CC13)
ALT: 13 U/L (ref 0–55)
ANION GAP: 11 meq/L (ref 3–11)
AST: 15 U/L (ref 5–34)
Albumin: 4 g/dL (ref 3.5–5.0)
Alkaline Phosphatase: 83 U/L (ref 40–150)
BUN: 10.8 mg/dL (ref 7.0–26.0)
CALCIUM: 9.6 mg/dL (ref 8.4–10.4)
CHLORIDE: 104 meq/L (ref 98–109)
CO2: 26 mEq/L (ref 22–29)
Creatinine: 0.9 mg/dL (ref 0.7–1.3)
EGFR: >90 mL/min/{1.73_m2} (ref >90)
Glucose: 91 mg/dl (ref 70–140)
Potassium: 4.6 mEq/L (ref 3.5–5.1)
Sodium: 141 mEq/L (ref 136–145)
Total Bilirubin: 0.22 mg/dL (ref 0.20–1.20)
Total Protein: 7.3 g/dL (ref 6.4–8.3)

## 2014-10-19 MED ORDER — OXYCODONE-ACETAMINOPHEN 10-325 MG PO TABS: 1.0000 | ORAL_TABLET | ORAL | Status: DC | PRN

## 2014-10-19 NOTE — Telephone Encounter (Signed)
Called patient to inform of Ct for 10-25-14 - arrival time - 1:45 pm @ [facility], lvm for a return call
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
## 2014-10-19 NOTE — Progress Notes (Signed)
Radiation Oncology         [phone] ________________________________  Name: Martin [name] MRN: [id number]  Date: 10/19/2014  DOB: 1960/09/30  Follow-Up Visit Note  CC: Kenn File, MD  Armandina Gemma, MD    ICD-9-CM ICD-10-CM   1. Anal cancer 154.3 C21.0 CBC with Differential     Comprehensive metabolic panel     CT Abdomen Pelvis W Wo Contrast    Diagnosis:   Anal carcinoma  Interval Since Last Radiation:  2 years and 9 months  Narrative:  The patient returns today for routine follow-up.  He continues to have pain in the deep Pelvis area. He admits this is worsened over the past 3 months. He has had to take Aleve/Tylenol in between his Percocet. Patient is also had pain in his left leg and has been on Neurontin for this issue. The patient may be enrolling with the pain clinic in may pending approval. He does occasionally have some rectal bleeding. He admits to having to strain but has soft stools. He has a bowel movement approximately every day. He denies any hematuria. He has erectile dysfunction which is helped with Viagra.                              ALLERGIES:  is allergic to aspirin; codeine; and morphine and related.  Meds: Current Outpatient Prescriptions  Medication Sig Dispense Refill  . acetaminophen (TYLENOL) 500 MG tablet Take 2,000 mg by mouth every 6 (six) hours as needed for pain.    . Aspirin-Salicylamide-Caffeine (BC FAST PAIN RELIEF) 650-195-33.3 MG PACK Take 1 packet by mouth every 6 (six) hours as needed (for pain).    Marland Kitchen docusate sodium (COLACE) 100 MG capsule Take 100 mg by mouth 2 (two) times daily as needed for mild constipation.    Marland Kitchen doxycycline (VIBRAMYCIN) 100 MG capsule Take 1 capsule (100 mg total) by mouth 2 (two) times daily. One po bid x 7 days 14 capsule 0  . gabapentin (NEURONTIN) 300 MG capsule Take 1 capsule (300 mg total) by mouth 3 (three) times daily. 90 capsule 3  . omeprazole (PRILOSEC) 20 MG capsule Take 1 capsule (20 mg total) by  mouth daily. 30 capsule 3  . sildenafil (VIAGRA) 100 MG tablet Take 0.5-1 tablets (50-100 mg total) by mouth daily as needed for erectile dysfunction. 5 tablet 11  . traZODone (DESYREL) 50 MG tablet Take 1-2 tablets (50-100 mg total) by mouth at bedtime as needed for sleep. 60 tablet 1  . oxyCODONE (ROXICODONE) 5 MG immediate release tablet Take 1 tablet (5 mg total) by mouth every 4 (four) hours as needed for moderate pain or severe pain. (Patient not taking: Reported on 10/19/2014) 15 tablet 0  . oxyCODONE-acetaminophen (PERCOCET) 10-325 MG per tablet Take 1 tablet by mouth every 4 (four) hours as needed for pain. 100 tablet 0   No current facility-administered medications for this encounter.    Physical Findings: The patient is in no acute distress. Patient is alert and oriented.  height is 5' 10"  (1.778 m) and weight is 151 lb 14.4 oz (68.901 kg). His oral temperature is 98 F (36.7 C). His blood pressure is 167/102 and his pulse is 96. His respiration is 16. Marland Kitchen No palpable subclavicular or axillary adenopathy. The lungs are clear to auscultation. The heart has a regular rhythm and rate. The abdomen is soft and nontender with normal bowel sounds. No inguinal adenopathy appreciated. No  obvious inguinal hernias. The testicles are soft without masses. The patient is a normal uncircumcised male. On rectal examination radiation changes are noted throughout the rectum and perirectal rectal area. Sphincter tone is normal. No palpable masses within the anal canal or distal rectum.  Lab Findings: Lab Results  Component Value Date   WBC 8.9 10/19/2014   HGB 14.4 10/19/2014   HCT 44.3 10/19/2014   MCV 95.2 10/19/2014   PLT 191 10/19/2014    Radiographic Findings: No results found.  Impression:  No evidence of recurrence on clinical exam today however,  worsening pain is worrisome for possible recurrence.  Plan:  Routine blood work today. The patient will be set up for a CT scan of the abdomen and  pelvis to rule out recurrence. He will be switched to 10 mg of oxycodone as part of Percocet to reduce the patient's Tylenol intake.  He has been taking 2 Percocet, 5 mg every dosage. Routine follow-up in 3 months.  ____________________________________ Blair Promise, MD

## 2014-10-25 ENCOUNTER — Other Ambulatory Visit: Payer: Self-pay | Admitting: Radiation Oncology

## 2014-10-25 ENCOUNTER — Ambulatory Visit (HOSPITAL_COMMUNITY)
Admission: RE | Admit: 2014-10-25 | Discharge: 2014-10-25 | Disposition: A | Discharge status: Home / Self Care | Payer: Self-pay | Source: Ambulatory Visit | Attending: Radiation Oncology | Admitting: Radiation Oncology

## 2014-10-25 ENCOUNTER — Encounter (HOSPITAL_COMMUNITY): Payer: Self-pay

## 2014-10-25 DIAGNOSIS — C210 Malignant neoplasm of anus, unspecified: Secondary | ICD-10-CM

## 2014-10-25 MED ORDER — IOHEXOL 300 MG/ML  SOLN
100.0000 mL | Freq: Once | INTRAMUSCULAR | Status: AC | PRN
  Administered 2014-10-25: 100 mL via INTRAVENOUS

## 2014-10-26 ENCOUNTER — Telehealth: Payer: Self-pay | Admitting: Oncology

## 2014-10-26 NOTE — Telephone Encounter (Signed)
Called Martin [name] and let him know that his CT abdomen/pelvis was good per Dr. Sondra Come.  Vyron did ask if it showed why he is still having rectal bleeding.

## 2014-11-02 ENCOUNTER — Encounter: Payer: Self-pay | Admitting: Family Medicine

## 2014-11-02 ENCOUNTER — Ambulatory Visit
Admission: RE | Admit: 2014-11-02 | Discharge: 2014-11-02 | Disposition: A | Discharge status: Home / Self Care | Payer: No Typology Code available for payment source | Source: Ambulatory Visit | Attending: Family Medicine | Admitting: Family Medicine

## 2014-11-02 ENCOUNTER — Ambulatory Visit: Payer: No Typology Code available for payment source | Admitting: Family Medicine

## 2014-11-02 ENCOUNTER — Ambulatory Visit (INDEPENDENT_AMBULATORY_CARE_PROVIDER_SITE_OTHER): Payer: No Typology Code available for payment source | Admitting: Family Medicine

## 2014-11-02 VITALS — BP 134/97 | HR 84 | Temp 98.8°F | Wt 144.0 lb

## 2014-11-02 DIAGNOSIS — R61 Generalized hyperhidrosis: Secondary | ICD-10-CM

## 2014-11-02 LAB — CBC WITH DIFFERENTIAL/PLATELET
Basophils Absolute: 0.1 10*3/uL (ref 0.0–0.1)
Basophils Relative: 1 % (ref 0–1)
EOS PCT: 2 % (ref 0–5)
Eosinophils Absolute: 0.2 10*3/uL (ref 0.0–0.7)
HCT: 45.5 % (ref 39.0–52.0)
Hemoglobin: 15.4 g/dL (ref 13.0–17.0)
LYMPHS ABS: 1.6 10*3/uL (ref 0.7–4.0)
LYMPHS PCT: 17 % (ref 12–46)
MCH: 31.5 pg (ref 26.0–34.0)
MCHC: 33.8 g/dL (ref 30.0–36.0)
MCV: 93 fL (ref 78.0–100.0)
MPV: 10.6 fL (ref 8.6–12.4)
Monocytes Absolute: 0.4 10*3/uL (ref 0.1–1.0)
Monocytes Relative: 4 % (ref 3–12)
Neutro Abs: 7.1 10*3/uL (ref 1.7–7.7)
Neutrophils Relative %: 76 % (ref 43–77)
Platelets: 212 10*3/uL (ref 150–400)
RBC: 4.89 MIL/uL (ref 4.22–5.81)
RDW: 13.8 % (ref 11.5–15.5)
WBC: 9.3 10*3/uL (ref 4.0–10.5)

## 2014-11-02 MED ORDER — LEVOFLOXACIN 750 MG PO TABS: 750.0000 mg | ORAL_TABLET | Freq: Every day | ORAL | Status: DC

## 2014-11-02 NOTE — Patient Instructions (Signed)
Nice to meet you. You may have a pneumonia. I would like you to go get a chest x-ray. We will treat you with an antibiotic called levaquin for this issue.  If you get short of breath, chest pain, fever, worsening chills, do not improve, or your symptoms worsen please seek medical attention.

## 2014-11-03 ENCOUNTER — Encounter: Payer: Self-pay | Admitting: Family Medicine

## 2014-11-03 ENCOUNTER — Telehealth: Payer: Self-pay | Admitting: *Deleted

## 2014-11-03 NOTE — Progress Notes (Signed)
Patient ID: Martin [name], male   DOB: 1961/01/25, 54 y.o.   MRN: [id number]  [name], MD Phone: [phone]  Martin [name] is a 54 y.o. male who presents today for same day appointment.   Night sweats: patient notes 4-5 days of night sweats and chills. Denies fevers, sneezing, chest pain, hemoptysis, urinary complaints, weight loss. Does note some cough and mild dyspnea and fatigue. No sweating during the day. No chest pain. No TB exposure. No diarrhea or vomiting. No orthopnea. Has history of anal cancer, though just had CT abd/pelvis with no signs of recurrence. No change in his chronic abdominal pain. Notes mom was sick with cough last week and he encountered a person with flu like symptoms last week.   Patient is a smoker.   ROS: Per HPI   Physical Exam Filed Vitals:   11/02/14 1056  BP: 134/97  Pulse: 84  Temp: 98.8 F (37.1 C)    Gen: NAD HEENT: PERRL,  MMM, normal OP, no exudates, no cervical or supraclavicular LAD Lungs: right lower lung field crackles, no wheezes, Nl WOB Heart: RRR  Abd: soft, NT, ND Exts: Non edematous BL  LE, warm and well perfused.    Assessment/Plan: Please see individual problem list.  [name], MD [place] PGY-3

## 2014-11-03 NOTE — Assessment & Plan Note (Signed)
Patient with recurrence of night sweats. Potentially related to a PNA with RLL crackles and clinical picture of fatigue, sweats, and chills. Will treat with levaquin for this given his history of cancer and recent doxy use for finger issue. Weight stable and recent CT abd/pelvis with no recurrence makes cancer less likely cause. Will obtain quantiferon gold to evaluate for TB, though no previous exposures. If continues to be an issue would consider HIV testing.

## 2014-11-03 NOTE — Telephone Encounter (Signed)
Received a call from Pierron at the [facility] needing clarification in pt's Rx for levofloxacin.  Crystal stated that they do not have the 750 mg dosage in stock only the 500 mg.  Also, pt has an allergy to Aspirin; when she went to enter the medication in the system; it came up as a cross interaction.  Dr. Caryl Bis precept with Dr. Valentina Lucks regarding the allergy; ok to continue to dispense levofloxacin; per Dr. Valentina Lucks unknown risk associated between levofloxacin and aspirin.  Ok to change the dosage to the 500 mg per Dr. Caryl Bis.  Pt takes a BC power; last dosage was yesterday per Crystal.  Pt did not have any problems from that.   Derl Barrow, RN

## 2014-11-04 LAB — QUANTIFERON TB GOLD ASSAY (BLOOD)
INTERFERON GAMMA RELEASE ASSAY: NEGATIVE
Mitogen value: 0.78 IU/mL
Quantiferon Nil Value: 0.01 IU/mL
Quantiferon Tb Ag Minus Nil Value: 0 IU/mL
TB Ag value: 0.01 IU/mL

## 2014-11-06 ENCOUNTER — Encounter: Payer: Self-pay | Admitting: Family Medicine

## 2014-11-06 ENCOUNTER — Ambulatory Visit (INDEPENDENT_AMBULATORY_CARE_PROVIDER_SITE_OTHER): Payer: No Typology Code available for payment source | Admitting: Family Medicine

## 2014-11-06 VITALS — BP 154/80 | HR 80 | Temp 98.4°F | Resp 18 | Wt 148.0 lb

## 2014-11-06 DIAGNOSIS — Z23 Encounter for immunization: Secondary | ICD-10-CM

## 2014-11-06 DIAGNOSIS — R61 Generalized hyperhidrosis: Secondary | ICD-10-CM

## 2014-11-06 DIAGNOSIS — M79606 Pain in leg, unspecified: Secondary | ICD-10-CM

## 2014-11-06 DIAGNOSIS — Z0000 Encounter for general adult medical examination without abnormal findings: Secondary | ICD-10-CM | POA: Insufficient documentation

## 2014-11-06 DIAGNOSIS — G4700 Insomnia, unspecified: Secondary | ICD-10-CM

## 2014-11-06 DIAGNOSIS — G894 Chronic pain syndrome: Secondary | ICD-10-CM

## 2014-11-06 MED ORDER — GABAPENTIN 300 MG PO CAPS: 900.0000 mg | ORAL_CAPSULE | Freq: Three times a day (TID) | ORAL | Status: DC

## 2014-11-06 MED ORDER — TRAZODONE HCL 50 MG PO TABS: 100.0000 mg | ORAL_TABLET | Freq: Every evening | ORAL | Status: DC | PRN

## 2014-11-06 NOTE — Assessment & Plan Note (Signed)
Colonoscopy up-to-date, tetanus shot today

## 2014-11-06 NOTE — Assessment & Plan Note (Signed)
Resolved with Levaquin,  Quanteferon gold negative Follow up as needed

## 2014-11-06 NOTE — Assessment & Plan Note (Signed)
Improved with trazodone, refilled

## 2014-11-06 NOTE — Assessment & Plan Note (Addendum)
Follow up referral to pain clinic Discussed referral with staff, PM&R not seeing orange card patients until Nov 2016

## 2014-11-06 NOTE — Patient Instructions (Signed)
Great to see you!  I'm glad the pain is going better!  Lets follow up in 3 months

## 2014-11-06 NOTE — Progress Notes (Signed)
Pt has an appt today with PCP and results discussed at visit. Jazmin Hartsell,CMA

## 2014-11-06 NOTE — Assessment & Plan Note (Signed)
Titrating gabapentin 900 X 3 times a day and is feeling much better continue current dose, sent larger Script

## 2014-11-06 NOTE — Progress Notes (Signed)
Patient ID: PHU RECORD, male   DOB: [birth year], 54 y.o.   MRN: [id number]   HPI  Patient presents today for follow-up  Patient was seen 3 days ago and diagnosed with pneumonia started on Levaquin. He started Levaquin and is on day 4 of his treatment. He feels much better and his night sweats have completely stopped. His cough is improved as well.  He also states that he's now on 900 mg 3 times a day of gabapentin and his stiffness and leg pain is much improved. He would still like a referral to pain clinic, he has not heard anything from them.  He states that he is breathing well, denies chest pain, leg pain is improved.  He is out of refills for trazodone, he would like a refill. He states that 100 mg helps him very much.  Smoking status noted ROS: Per HPI  Objective: BP 154/80 mmHg  Pulse 80  Temp(Src) 98.4 F (36.9 C) (Oral)  Resp 18  Wt 148 lb (67.132 kg)  SpO2 99% Gen: NAD, alert, cooperative with exam HEENT: NCAT CV: RRR, good S1/S2, no murmur Resp: CTABL, no wheezes, non-labored Neuro: Alert and oriented, No gross deficits  Assessment and plan:  Leg pain Titrating gabapentin 900 X 3 times a day and is feeling much better continue current dose, sent larger Script   Night sweats Resolved with Levaquin,  Quanteferon gold negative Follow up as needed    Insomnia Improved with trazodone, refilled   Chronic pain syndrome Follow up referral to pain clinic Discussed referral with staff, PM&R not seeing orange card patients until Nov 2016   Healthcare maintenance Colonoscopy up-to-date, tetanus shot today     Orders Placed This Encounter  Procedures  . Tdap vaccine greater than or equal to 7yo IM    Meds ordered this encounter  Medications  . gabapentin (NEURONTIN) 300 MG capsule    Sig: Take 3 capsules (900 mg total) by mouth 3 (three) times daily.    Dispense:  270 capsule    Refill:  5  . traZODone (DESYREL) 50 MG tablet    Sig: Take 2  tablets (100 mg total) by mouth at bedtime as needed for sleep.    Dispense:  60 tablet    Refill:  5

## 2014-11-14 ENCOUNTER — Telehealth: Payer: Self-pay | Admitting: Oncology

## 2014-11-14 ENCOUNTER — Other Ambulatory Visit: Payer: Self-pay | Admitting: Radiation Oncology

## 2014-11-14 MED ORDER — OXYCODONE-ACETAMINOPHEN 10-325 MG PO TABS: 1.0000 | ORAL_TABLET | ORAL | Status: DC | PRN

## 2014-11-14 NOTE — Telephone Encounter (Signed)
Martin [name] left a message requesting a refill on pain medication.  He last had oxyCODONE-acetaminophen (PERCOCET) 10-325 MG per tablet filled on 10/19/14.

## 2014-11-15 ENCOUNTER — Telehealth: Payer: Self-pay | Admitting: *Deleted

## 2014-11-15 ENCOUNTER — Telehealth: Payer: Self-pay | Admitting: Oncology

## 2014-11-15 ENCOUNTER — Other Ambulatory Visit: Payer: Self-pay | Admitting: Oncology

## 2014-11-15 DIAGNOSIS — C210 Malignant neoplasm of anus, unspecified: Secondary | ICD-10-CM

## 2014-11-15 NOTE — Telephone Encounter (Signed)
Called Terrick and let him know his pain medication prescription is ready to pick up in the Radiation nursing area.  Also asked if he would be willing to get a colonoscopy per Dr. Sondra Come.  Nayquan said he would but would need a new referral to Dr. Hilarie Fredrickson.

## 2014-11-15 NOTE — Telephone Encounter (Signed)
CALLED PATIENT TO INFORM OF APPT. @ DR. PRYTLE'S OFFICE ON 11-16-14- ARRIVAL TIME - 9:15 AM, LVM FOR A RETURN CALL

## 2014-11-16 ENCOUNTER — Encounter: Payer: Self-pay | Admitting: Physician Assistant

## 2014-11-16 ENCOUNTER — Ambulatory Visit (INDEPENDENT_AMBULATORY_CARE_PROVIDER_SITE_OTHER): Payer: No Typology Code available for payment source | Admitting: Physician Assistant

## 2014-11-16 VITALS — BP 110/70 | HR 80 | Ht 70.0 in | Wt 150.4 lb

## 2014-11-16 DIAGNOSIS — Z85048 Personal history of other malignant neoplasm of rectum, rectosigmoid junction, and anus: Secondary | ICD-10-CM

## 2014-11-16 DIAGNOSIS — M25559 Pain in unspecified hip: Secondary | ICD-10-CM

## 2014-11-16 DIAGNOSIS — K625 Hemorrhage of anus and rectum: Secondary | ICD-10-CM

## 2014-11-16 DIAGNOSIS — R194 Change in bowel habit: Secondary | ICD-10-CM

## 2014-11-16 MED ORDER — MOVIPREP 100 G PO SOLR: 1.0000 | ORAL | Status: DC

## 2014-11-16 NOTE — Progress Notes (Addendum)
Patient ID: Martin [name], male   DOB: 1960/12/19, 54 y.o.   MRN: [id number]   Subjective:    Patient ID: Martin [name], male    DOB: 06-21-1961, 54 y.o.   MRN: [id number]  HPI Tonny is a pleasant 54 year old white male known to Dr.Pyrtle. He is referred today by Dr.Kinard for evaluation of rectal bleeding. 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 He had a transanal excision per Dr. Harlow Asa. He had positive margins. Surgery was followed by radiation and chemotherapy. He had colonoscopy with Dr. Hilarie Fredrickson done in August 2013 at which time he was complaining of diarrhea. He was found to have one sessile polyp which was hyperplastic mild diverticulosis and had some mild erythema in the left colon. Biopsies were taken and this was consistent with lymphocytic colitis. He had been given a course of Entocort however could not afford it and did not wind up taking anything specific. He says the diarrhea has subsided. He says he is been having intermittent rectal bleeding over the past several months. He says he may go at least a couple of weeks without any bleeding and then have blood with his bowel movements for couple of days. He will see bright red blood in the commode and sometimes streaks on his stool. He has had some chronic pelvic pain since radiation for which she is on pain medication. He also says that his stools are soft but he has a lot of difficulty evacuating his bowels and has to strain very hard etc. he feels as if his rectum is narrowed. No complaints of specific rectal pain.  Recent hgb 15.5  Review of Systems  Pertinent positive and negative review of systems were noted in the above HPI section.  All other review of systems was otherwise negative.  Outpatient Encounter Prescriptions as of 11/16/2014  Medication Sig  . Aspirin-Salicylamide-Caffeine (BC FAST PAIN RELIEF) 650-195-33.3 MG PACK Take 1 packet by mouth every 6 (six) hours as  needed (for pain).  Marland Kitchen docusate sodium (COLACE) 100 MG capsule Take 100 mg by mouth 2 (two) times daily as needed for mild constipation.  . gabapentin (NEURONTIN) 300 MG capsule Take 3 capsules (900 mg total) by mouth 3 (three) times daily.  Marland Kitchen levofloxacin (LEVAQUIN) 750 MG tablet Take 1 tablet (750 mg total) by mouth daily.  Marland Kitchen omeprazole (PRILOSEC) 20 MG capsule Take 1 capsule (20 mg total) by mouth daily.  Marland Kitchen oxyCODONE-acetaminophen (PERCOCET) 10-325 MG per tablet Take 1 tablet by mouth every 4 (four) hours as needed for pain.  . sildenafil (VIAGRA) 100 MG tablet Take 0.5-1 tablets (50-100 mg total) by mouth daily as needed for erectile dysfunction.  . traZODone (DESYREL) 50 MG tablet Take 2 tablets (100 mg total) by mouth at bedtime as needed for sleep.  Marland Kitchen MOVIPREP 100 G SOLR Take 1 kit (200 g total) by mouth as directed.  . [DISCONTINUED] acetaminophen (TYLENOL) 500 MG tablet Take 2,000 mg by mouth every 6 (six) hours as needed for pain.  . [DISCONTINUED] oxyCODONE (ROXICODONE) 5 MG immediate release tablet Take 1 tablet (5 mg total) by mouth every 4 (four) hours as needed for moderate pain or severe pain. (Patient not taking: Reported on 10/19/2014)   Allergies  Allergen Reactions  . Aspirin Swelling  . Codeine Nausea Only  . Morphine And Related Itching   Patient Active Problem List   Diagnosis Date Noted  . Healthcare maintenance 11/06/2014  .  Cough 10/05/2014  . Insomnia 09/13/2014  . Leg pain 09/13/2014  . Erectile dysfunction 11/30/2013  . Chronic pain syndrome 11/30/2013  . Depression 11/30/2013  . GERD (gastroesophageal reflux disease)   . Hypertension   . Anxiety   . Arthritis   . DJD (degenerative joint disease) of lumbar spine   . Radiation   . Back pain 08/12/2012  . Night sweats 08/12/2012  . Tobacco abuse counseling 05/26/2012  . 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 B12 nutritional deficiency 03/19/2012  . Folate deficiency 03/19/2012  . Inguinal hernia 11/24/2011    . Cancer 10/14/2011  . Internal hemorrhoid 09/11/2011  . Anal cancer 10/09/2010   History   Social History  . Marital Status: Single    Spouse Name: N/A  . Number of Children: 3  . Years of Education: N/A   Occupational History  . unemployed    Social History Main Topics  . Smoking status: Current Every Day Smoker -- 0.30 packs/day for 41 years    Types: Cigarettes  . Smokeless tobacco: Never Used     Comment: Cutting back  . Alcohol Use: 3.6 oz/week    6 Cans of beer per week     Comment: weekend    6pk weekend, beer   . Drug Use: Yes    Special: Marijuana     Comment: marijuana in past, occasionally now-last use 3-4 weeks ago  . Sexual Activity: Yes    Birth Control/ Protection: Condom   Other Topics Concern  . Not on file   Social History Narrative    Mr. Ramroop family history includes Asthma in his father and mother; Cancer in his father; Colon cancer in his father; Hypertension in his father and mother. There is no history of Stomach cancer or Esophageal cancer.      Objective:    Filed Vitals:   11/16/14 0919  BP: 110/70  Pulse: 80    Physical Exam  well-developed white male in no acute distress, pleasant blood pressure 110/70 pulse 80 height 5 foot 10 weight 150. 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 #2 Lymphocytic colitis - currently asymptomatic #3 chronic pain syndrome #4 coccyx pain- post fall last night-contusion  Plan; Schedule for colonoscopy with Dr. Hilarie Fredrickson . Procedure discussed in detail and pt agreeable to proceed   Alfredia Ferguson PA-C 11/16/2014   Cc: Timmothy Euler, MD  Addendum: Reviewed and agree with management. Jerene Bears, MD

## 2014-11-16 NOTE — Patient Instructions (Addendum)
We sent a prescription for Moviprep sent to [facility] program. You have been scheduled for a colonoscopy. Please follow written instructions given to you at your visit today.  Please pick up your prep supplies at the pharmacy within the next 1-3 days. If you use inhalers (even only as needed), please bring them with you on the day of your procedure.

## 2014-11-24 ENCOUNTER — Telehealth: Payer: Self-pay

## 2014-11-24 NOTE — Telephone Encounter (Signed)
Received call from pharmacy where moviprep rx had been sent to tell us that patient had come to pick it up but they did not carry it.  Confirmed with Dottie that patient could have the split dose miralax prep and told the pharmacist what he would need to purchase.   Patient will come by the office to get re-instructed with the new instructions I have printed.

## 2014-11-27 ENCOUNTER — Ambulatory Visit (AMBULATORY_SURGERY_CENTER): Payer: Self-pay | Admitting: Internal Medicine

## 2014-11-27 ENCOUNTER — Encounter: Payer: Self-pay | Admitting: Internal Medicine

## 2014-11-27 VITALS — BP 135/78 | HR 69 | Temp 98.0°F | Resp 22 | Ht 70.0 in | Wt 150.0 lb

## 2014-11-27 DIAGNOSIS — Z85048 Personal history of other malignant neoplasm of rectum, rectosigmoid junction, and anus: Secondary | ICD-10-CM

## 2014-11-27 DIAGNOSIS — K625 Hemorrhage of anus and rectum: Secondary | ICD-10-CM

## 2014-11-27 DIAGNOSIS — C210 Malignant neoplasm of anus, unspecified: Secondary | ICD-10-CM

## 2014-11-27 MED ORDER — SODIUM CHLORIDE 0.9 % IV SOLN: 500.0000 mL | INTRAVENOUS | Status: DC

## 2014-11-27 NOTE — Op Note (Signed)
[place]  [address]. [place], [id number]   COLONOSCOPY PROCEDURE REPORT  PATIENT: Martin [name], Martin [name]  MR#: [id number] BIRTHDATE: [birth year] , 53  yrs. old GENDER: male ENDOSCOPIST: Jerene Bears, MD REFERRED UT:MLYYT Kinard, M.D. PROCEDURE DATE:  11/27/2014 PROCEDURE:   Colonoscopy, diagnostic First Screening Colonoscopy - Avg.  risk and is 50 yrs.  old or older - No.  Prior Negative Screening - Now for repeat screening. N/A  History of Adenoma - Now for follow-up colonoscopy & has been > or = to 3 yrs.  N/A ASA CLASS:   Class III INDICATIONS:rectal bleeding, rectal pain, history of anal cancer s/p transanal excision in 2013, s/p chemotherapy and radiation. MEDICATIONS: Monitored anesthesia care and Propofol 400 mg IV  DESCRIPTION OF PROCEDURE:   After the risks benefits and alternatives of the procedure were thoroughly explained, informed consent was obtained.  The digital rectal exam revealed skin changes consistent with radiation exposure, but no anal stenosis and no masses.   The LB [id number]  endoscope was introduced through the anus and advanced to the cecum, which was identified by both the appendix and ileocecal valve. No adverse events experienced.   The quality of the prep was (MoviPrep was used) good.  The instrument was then slowly withdrawn as the colon was fully examined.   COLON FINDINGS: There was mild diverticulosis noted in the descending colon and sigmoid colon with associated petechiae.   The examination was otherwise normal. In the rectum immediately proximal to the dentate line there is very mild radiation proctitis.  Internal hemorrhoids were seen.  There is a very small (1-2 mm) discrete area of squamous appearing mucosa near the dentate line, query small polyp.  Not biopsied due to proximity to radiation change and the dentate line.   Retroflexion not performed due to narrowed vault. The time to cecum = 3.0 Withdrawal time  = 10.3   The scope was withdrawn and the procedure completed.  COMPLICATIONS: There were no immediate complications.  ENDOSCOPIC IMPRESSION: 1.   There was mild diverticulosis noted in the descending colon and sigmoid colon 2.   The examination was otherwise normal of the colon 3.   Mild radiation proctitis (nonbleeding), internal hemorrhoids, discrete possible polyp at dentate line. No anal stenosis.  RECOMMENDATIONS: Follow-up with Dr.  Harlow Asa is recommended for anoscopic examination and possible biopsy. Once this occurs can discuss hemorrhoidal treatment if bleeding continues.  eSigned:  Jerene Bears, MD 11/27/2014 10:28 AM   cc: Gery Pray, MD, Armandina Gemma, MD, and The Patient

## 2014-11-27 NOTE — Patient Instructions (Signed)
Discharge instructions given. Handouts on diverticulosis and hemorrhoids. Resume previous medications. YOU HAD AN ENDOSCOPIC PROCEDURE TODAY AT [facility]:   Refer to the procedure report that was given to you for any specific questions about what was found during the examination.  If the procedure report does not answer your questions, please call your gastroenterologist to clarify.  If you requested that your care partner not be given the details of your procedure findings, then the procedure report has been included in a sealed envelope for you to review at your convenience later.  YOU SHOULD EXPECT: Some feelings of bloating in the abdomen. Passage of more gas than usual.  Walking can help get rid of the air that was put into your GI tract during the procedure and reduce the bloating. If you had a lower endoscopy (such as a colonoscopy or flexible sigmoidoscopy) you may notice spotting of blood in your stool or on the toilet paper. If you underwent a bowel prep for your procedure, you may not have a normal bowel movement for a few days.  Please Note:  You might notice some irritation and congestion in your nose or some drainage.  This is from the oxygen used during your procedure.  There is no need for concern and it should clear up in a day or so.  SYMPTOMS TO REPORT IMMEDIATELY:   Following lower endoscopy (colonoscopy or flexible sigmoidoscopy):  Excessive amounts of blood in the stool  Significant tenderness or worsening of abdominal pains  Swelling of the abdomen that is new, acute  Fever of 100F or higher   For urgent or emergent issues, a gastroenterologist can be reached at any hour by calling [phone].   DIET: Your first meal following the procedure should be a small meal and then it is ok to progress to your normal diet. Heavy or fried foods are harder to digest and may make you feel nauseous or bloated.  Likewise, meals heavy in dairy and vegetables can  increase bloating.  Drink plenty of fluids but you should avoid alcoholic beverages for 24 hours.  ACTIVITY:  You should plan to take it easy for the rest of today and you should NOT DRIVE or use heavy machinery until tomorrow (because of the sedation medicines used during the test).    FOLLOW UP: Our staff will call the number listed on your records the next business day following your procedure to check on you and address any questions or concerns that you may have regarding the information given to you following your procedure. If we do not reach you, we will leave a message.  However, if you are feeling well and you are not experiencing any problems, there is no need to return our call.  We will assume that you have returned to your regular daily activities without incident.  If any biopsies were taken you will be contacted by phone or by letter within the next 1-3 weeks.  Please call us at [phone] if you have not heard about the biopsies in 3 weeks.    SIGNATURES/CONFIDENTIALITY: You and/or your care partner have signed paperwork which will be entered into your electronic medical record.  These signatures attest to the fact that that the information above on your After Visit Summary has been reviewed and is understood.  Full responsibility of the confidentiality of this discharge information lies with you and/or your care-partner.

## 2014-11-27 NOTE — Progress Notes (Signed)
A/ox3, pleased with MAC, report to RN 

## 2014-11-28 ENCOUNTER — Telehealth: Payer: Self-pay | Admitting: *Deleted

## 2014-11-28 NOTE — Telephone Encounter (Signed)
  Follow up Call-  Call back number 11/27/2014 04/27/2012  Post procedure Call Back phone  # [phone]  Permission to leave phone message Yes Yes     Patient questions:  Do you have a fever, pain , or abdominal swelling? No. Pain Score  0 *  Have you tolerated food without any problems? Yes.    Have you been able to return to your normal activities? Yes.    Do you have any questions about your discharge instructions: Diet   No. Medications  No. Follow up visit  No.  Do you have questions or concerns about your Care? No.  Actions: * If pain score is 4 or above: No action needed, pain <4.

## 2014-11-28 NOTE — Telephone Encounter (Signed)
Patient came by the office and I instructed him on the split dose/ Miralax prep.  Patient acknowledged and understood.

## 2014-11-29 ENCOUNTER — Encounter: Payer: Self-pay | Admitting: Family Medicine

## 2014-11-29 DIAGNOSIS — K5790 Diverticulosis of intestine, part unspecified, without perforation or abscess without bleeding: Secondary | ICD-10-CM | POA: Insufficient documentation

## 2014-12-12 ENCOUNTER — Encounter: Payer: Self-pay | Admitting: Family Medicine

## 2014-12-12 NOTE — Progress Notes (Unsigned)
Pt was last seen in February, and he says that Dr. Wendi Snipes was going to refer him to see someone for pain management however he has never heard from anyone regarding the matter could someone please contact the pt about this? [phone]  / Martin [name], Martin [name]

## 2014-12-13 ENCOUNTER — Other Ambulatory Visit: Payer: Self-pay | Admitting: Radiation Oncology

## 2014-12-13 ENCOUNTER — Telehealth: Payer: Self-pay | Admitting: Oncology

## 2014-12-13 ENCOUNTER — Ambulatory Visit: Payer: Self-pay

## 2014-12-13 MED ORDER — OXYCODONE-ACETAMINOPHEN 10-325 MG PO TABS: 1.0000 | ORAL_TABLET | ORAL | Status: DC | PRN

## 2014-12-13 NOTE — Telephone Encounter (Signed)
Martin [name] called and requested a refill on oxyCODONE-acetaminophen (PERCOCET) 10-325 MG per tablet.  He last had it filled on 11/14/14.  He has a few tablets left but wants to make sure he has a refill before this weekend.  He reports that he saw Dr. Hilarie Fredrickson and had a colonoscopy on 11/27/14.  He is going to have a biopsy done on 12/26/14.

## 2014-12-22 ENCOUNTER — Telehealth: Payer: Self-pay | Admitting: Family Medicine

## 2014-12-22 DIAGNOSIS — C210 Malignant neoplasm of anus, unspecified: Secondary | ICD-10-CM

## 2014-12-22 NOTE — Telephone Encounter (Signed)
Patient requests Referral for surgery on April 19 to be send to [facility] (At.: [place] at [phone]). Please, follow up with Patient.

## 2014-12-22 NOTE — Telephone Encounter (Signed)
I have written the referral to General surgery.   Will ask nursing to inform patient and arrange appt if needed.   Laroy Apple, MD [place] Resident, PGY-3 12/22/2014, 4:02 PM

## 2014-12-22 NOTE — Telephone Encounter (Signed)
Patient had colonoscopy on 3/21 with the following recommendation:  RECOMMENDATIONS: Follow-up with Dr. Harlow Asa is recommended for anoscopic examination and possible biopsy. Once this occurs can discuss hemorrhoidal treatment if bleeding continues.  Patient states since he has orange card, the referral needs to come from PCP's office, will forward to MD.

## 2015-01-09 ENCOUNTER — Ambulatory Visit: Payer: Self-pay | Admitting: Family Medicine

## 2015-01-12 ENCOUNTER — Other Ambulatory Visit: Payer: Self-pay | Admitting: Radiation Oncology

## 2015-01-12 ENCOUNTER — Telehealth: Payer: Self-pay | Admitting: *Deleted

## 2015-01-12 ENCOUNTER — Telehealth: Payer: Self-pay | Admitting: Oncology

## 2015-01-12 MED ORDER — OXYCODONE-ACETAMINOPHEN 10-325 MG PO TABS
1.0000 | ORAL_TABLET | ORAL | Status: DC | PRN
Start: 2015-01-12 — End: 2015-01-30

## 2015-01-12 NOTE — Telephone Encounter (Signed)
Patient called requesting pain refill on oxycodone/apap 10/326m, he will run out before the weekend, Dr. KSondra Comeis off on Fridays and will route this to Dr. KClabe Sealnurse KSantiago GladHess,RN, she will call you if the on call MD will refill this, if he will call the pharmacy  3 days before running out they will fax a refil request or call this aoffice before the weekend if he knbows he is going to run out ,patient will wait for KSantiago GladRN call 8:16 AM

## 2015-01-12 NOTE — Telephone Encounter (Signed)
Martin [name] called about his pain medication refill.  Advised him the Dr. Lisbeth Renshaw has filled it and it is ready for pick up in the Radiation Nursing area.  Martin [name] said he has a procedure on Tuesday but is not sure if he needs to do anything before hand.  Advised him to call Dr. Harlow Asa or Dr. Hilarie Fredrickson.  Martin [name] verbalized agreement.

## 2015-01-18 ENCOUNTER — Telehealth: Payer: Self-pay | Admitting: Oncology

## 2015-01-18 ENCOUNTER — Ambulatory Visit
Admission: RE | Admit: 2015-01-18 | Discharge: 2015-01-18 | Disposition: A | Discharge status: Home / Self Care | Payer: Self-pay | Source: Ambulatory Visit | Attending: Radiation Oncology | Admitting: Radiation Oncology

## 2015-01-18 NOTE — Telephone Encounter (Signed)
Martin [name] called and said he had a death in his family and is not in town.  He needs to reschedule his follow up with Dr. Sondra Come.  Transferred him to Santiago Glad, Art therapist to reschedule.

## 2015-01-30 ENCOUNTER — Encounter: Payer: Self-pay | Admitting: Radiation Oncology

## 2015-01-30 ENCOUNTER — Ambulatory Visit
Admission: RE | Admit: 2015-01-30 | Discharge: 2015-01-30 | Disposition: A | Discharge status: Home / Self Care | Payer: Self-pay | Source: Ambulatory Visit | Attending: Radiation Oncology | Admitting: Radiation Oncology

## 2015-01-30 VITALS — BP 139/84 | HR 80 | Temp 98.5°F | Resp 16 | Ht 70.0 in | Wt 146.6 lb

## 2015-01-30 DIAGNOSIS — C210 Malignant neoplasm of anus, unspecified: Secondary | ICD-10-CM

## 2015-01-30 MED ORDER — OXYCODONE-ACETAMINOPHEN 10-325 MG PO TABS: 1.0000 | ORAL_TABLET | ORAL | Status: DC | PRN

## 2015-01-30 NOTE — Progress Notes (Signed)
Martin [name] here for follow up.  He denies pain now but reports having pain inside his lower abdomen/pelvic area and is worse after eating.  He is taking 3-4 percocet per day.  He reports seeing rectal bleeding last week.  He reports his stool caliber is small.  He denies having constipation.  He reports a poor appetite and has lost 4 lbs since March.  He reports getting tired easily.  He is going to see a doctor at [facility] on 02/07/15 for a consult for a biopsy.  He had a colonoscopy on 11/27/14.  BP 139/84 mmHg  Pulse 80  Temp(Src) 98.5 F (36.9 C) (Oral)  Resp 16  Ht 5' 10"  (1.778 m)  Wt 146 lb 9.6 oz (66.497 kg)  BMI 21.03 kg/m2  SpO2 100%   Wt Readings from Last 3 Encounters:  11/27/14 150 lb (68.04 kg)  11/16/14 150 lb 6.4 oz (68.221 kg)  11/06/14 148 lb (67.132 kg)

## 2015-01-30 NOTE — Progress Notes (Signed)
Radiation Oncology         [phone] ________________________________  Name: Martin [name] MRN: [id number]  Date: 01/30/2015  DOB: 04/11/1961  Follow-Up Visit Note  CC: Kenn File, MD  Armandina Gemma, MD    ICD-9-CM ICD-10-CM   1. Anal cancer 154.3 C21.0     Diagnosis:   Anal cancer  Interval Since Last Radiation:  3 years    Narrative:  The patient returns today for routine follow-up.  He continues to have pain deep in the low pelvis area. This is more bothersome in the morning with eating. He denies any nausea or emesis or problems with diarrhea. Patient did undergo colonoscopy March 21 of this year was noted to have possible polyp at the dentate line. The patient has been referred to Gen. surgery for further evaluation of this issue.  According to the patient,  his consult with the pain clinic has been pushed back to November in light of their busy schedule.                              ALLERGIES:  is allergic to aspirin; codeine; and morphine and related.  Meds: Current Outpatient Prescriptions  Medication Sig Dispense Refill  . docusate sodium (COLACE) 100 MG capsule Take 100 mg by mouth 2 (two) times daily as needed for mild constipation.    . gabapentin (NEURONTIN) 300 MG capsule Take 3 capsules (900 mg total) by mouth 3 (three) times daily. 270 capsule 5  . omeprazole (PRILOSEC) 20 MG capsule Take 1 capsule (20 mg total) by mouth daily. 30 capsule 3  . oxyCODONE-acetaminophen (PERCOCET) 10-325 MG per tablet Take 1 tablet by mouth every 4 (four) hours as needed for pain. 100 tablet 0  . traZODone (DESYREL) 50 MG tablet Take 2 tablets (100 mg total) by mouth at bedtime as needed for sleep. 60 tablet 5  . Aspirin-Salicylamide-Caffeine (BC FAST PAIN RELIEF) 650-195-33.3 MG PACK Take 1 packet by mouth every 6 (six) hours as needed (for pain).    . sildenafil (VIAGRA) 100 MG tablet Take 0.5-1 tablets (50-100 mg total) by mouth daily as needed for erectile dysfunction.  (Patient not taking: Reported on 01/30/2015) 5 tablet 11   No current facility-administered medications for this encounter.    Physical Findings: The patient is in no acute distress. Patient is alert and oriented.  height is 5' 10"  (1.778 m) and weight is 146 lb 9.6 oz (66.497 kg). His oral temperature is 98.5 F (36.9 C). His blood pressure is 139/84 and his pulse is 80. His respiration is 16 and oxygen saturation is 100%. .  No palpable subclavicular or axillary adenopathy. The lungs are clear to auscultation. The heart has a regular rhythm and rate. The abdomen is soft and nontender with normal bowel sounds. No inguinal adenopathy is appreciated. No recurrent hernias. Rectal examination sphincter tone is noted to be normal. There is some mild scar tissue along the anterior rectum but no palpable mass in the anal canal or lower rectum. Patient does have significant telangiectasias along the perianal region  Lab Findings: Lab Results  Component Value Date   WBC 9.3 11/02/2014   HGB 15.4 11/02/2014   HCT 45.5 11/02/2014   MCV 93.0 11/02/2014   PLT 212 11/02/2014    Radiographic Findings: No results found.  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 surgery  as above consideration for biopsy. Routine follow-up in radiation oncology in 6 months. Refill on the patient's pain medication since he is unable to be seen in the pain clinic in the near future. Should mention the patient is also on gabapentin thru his primary care physician  ____________________________________ Blair Promise, MD

## 2015-01-31 ENCOUNTER — Telehealth: Payer: Self-pay | Admitting: Family Medicine

## 2015-01-31 ENCOUNTER — Encounter: Payer: Self-pay | Admitting: Nutrition

## 2015-01-31 NOTE — Telephone Encounter (Signed)
Has a bad abcess tooth Would like a doctor to call in [place] Dept

## 2015-01-31 NOTE — Telephone Encounter (Signed)
Will ask nursing to ask him to be seen.   Laroy Apple, MD [place] Resident, PGY-3 01/31/2015, 11:14 AM

## 2015-01-31 NOTE — Telephone Encounter (Signed)
Pt informed. We didn't have any openings today so i told him to go to urgent care. Deseree Kennon Holter, CMA

## 2015-01-31 NOTE — Progress Notes (Signed)
Provided third and final case of Ensure Plus.

## 2015-02-08 ENCOUNTER — Ambulatory Visit: Payer: Self-pay | Admitting: Family Medicine

## 2015-02-28 ENCOUNTER — Telehealth: Payer: Self-pay | Admitting: Oncology

## 2015-02-28 NOTE — Telephone Encounter (Signed)
Thos left a message requesting a refill on Percocet.  He said he has some left but wanted to give more notice.

## 2015-03-01 ENCOUNTER — Telehealth: Payer: Self-pay | Admitting: *Deleted

## 2015-03-01 ENCOUNTER — Other Ambulatory Visit: Payer: Self-pay | Admitting: Radiation Oncology

## 2015-03-01 ENCOUNTER — Telehealth: Payer: Self-pay | Admitting: Oncology

## 2015-03-01 MED ORDER — OXYCODONE-ACETAMINOPHEN 10-325 MG PO TABS: 1.0000 | ORAL_TABLET | ORAL | Status: DC | PRN

## 2015-03-01 NOTE — Telephone Encounter (Signed)
Patient called requesting refill on pain medication percocet 10/380m, asked for Dr. KClabe Sealnurse,informed him that Dr. KSondra Comewas out of the office rest of the week and KVarney Baas was at lunch, he asked if another MD could write this RX, informed him I would give this request to KElmo Putt RN  When she gets back from lunch 11:34 AM

## 2015-03-01 NOTE — Telephone Encounter (Signed)
Called Martin [name] and let him know that his prescription is ready for pick up.  Martin [name] verbalized agreement.

## 2015-03-20 ENCOUNTER — Ambulatory Visit: Payer: No Typology Code available for payment source | Attending: General Surgery | Admitting: Physical Therapy

## 2015-03-20 ENCOUNTER — Encounter: Payer: Self-pay | Admitting: Physical Therapy

## 2015-03-20 DIAGNOSIS — N8189 Other female genital prolapse: Secondary | ICD-10-CM | POA: Insufficient documentation

## 2015-03-20 DIAGNOSIS — M6289 Other specified disorders of muscle: Secondary | ICD-10-CM

## 2015-03-20 DIAGNOSIS — N8184 Pelvic muscle wasting: Secondary | ICD-10-CM | POA: Insufficient documentation

## 2015-03-20 NOTE — Therapy (Signed)
[facility] [address] [place], [place], [address] Phone: [phone]   Fax:  [phone]  Physical Therapy Evaluation  Patient Details  Name: Martin [name] MRN: [id number] Date of Birth: [birth year] Referring Provider:  Leighton Ruff, MD  Encounter Date: 03/20/2015      PT End of Session - 03/20/15 1641    Visit Number 1   Date for PT Re-Evaluation 07/21/15   PT Start Time 4917   PT Stop Time 1640   PT Time Calculation (min) 70 min   Activity Tolerance Patient tolerated treatment well   Behavior During Therapy [facility] for tasks assessed/performed      Past Medical History  Diagnosis Date  . Inguinal hernia   . GERD (gastroesophageal reflux disease)   . Hypertension     EKG 12/12 EPIC   no  PCP- states increased lately but hasnt been diagnosed formally  . Pneumonia     as child, cough at present with no fever  . Hemorrhoid     internal  . 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 Allergy 2 2013  . Anxiety   . Arthritis     DDD lumbar, arthritis knees  . DJD (degenerative joint disease) of lumbar spine   . Radiation 11/19/11-01/08/12    5040 cGy 28 fx Pelvis and inguinal area  . Pneumonia     as child, cough at present with no fever     Past Surgical History  Procedure Laterality Date  . Transanal excision  10/14/2011    Dr.Todd Gerkin  . Surgical pathology   10/14/2011    squamous cell ca of anus  . Appendectomy      age 53 or 5  . Examination under anesthesia  10/14/2011    Procedure: EXAM UNDER ANESTHESIA;  Surgeon: Earnstine Regal, MD;  Location: [facility];  Service: General;  Laterality: N/A;  exam under anethesia, Excision of mass anal canal, 1.5cm  . Inguinal hernia repair  05/11/2012    Procedure: HERNIA REPAIR INGUINAL ADULT;  Surgeon: Earnstine Regal, MD;  Location: [facility];  Service: General;  Laterality: Left;  left inguinal hernia repair with mesh     There were no vitals filed for this visit.  Visit Diagnosis:  Pelvic floor weakness - Plan: PT plan of care cert/re-cert  Pelvic floor dysfunction - Plan: PT plan of care cert/re-cert      Subjective Assessment - 03/20/15 1540    Subjective Patient reports when he uses the commode he has to strain for a tiny and soft bowel movement.  I have pain all the time in the pelvic area. I am still getting a rash since the radiation. Patient reports back pain that started 5 days ago with sudden onset.  Frequent urination.    Limitations Sitting;Standing   How long can you sit comfortably? several minutes   How long can you stand comfortably? 30 minutes   How long can you walk comfortably? uncomfortable   Patient Stated Goals reduce back pain and improve bowel movements   Currently in Pain? Yes   Pain Score 4    Pain Location Pelvis   Pain Orientation Anterior;Lower   Pain Descriptors / Indicators Stabbing;Aching   Pain Type Chronic pain   Pain Onset More than a month ago   Pain Frequency Constant   Aggravating Factors  sitting, movement   Pain Relieving Factors medication  Effect of Pain on Daily Activities unable to have an erection, pain with all activities   Multiple Pain Sites Yes   Pain Score 7   Pain Location Back   Pain Orientation Lower   Pain Descriptors / Indicators Stabbing;Sharp   Pain Type Chronic pain   Pain Onset In the past 7 days   Pain Frequency Constant   Aggravating Factors  standing, bending   Pain Relieving Factors sit in a chair and press back against the chair            [facility] PT Assessment - 03/20/15 0001    Assessment   Medical Diagnosis Anal Cancer V10.06/S85.048   Onset Date/Surgical Date 09/08/14   Prior Therapy None   Precautions   Precautions Other (comment)  No ultrasound/ no heat in pelvic area   Balance Screen   Has the patient fallen in the past 6 months No   Has the patient had a decrease in activity level because of a fear of  falling?  No   Is the patient reluctant to leave their home because of a fear of falling?  No   Prior Function   Level of Independence Independent   Posture/Postural Control   Posture/Postural Control Postural limitations   Postural Limitations Forward head;Rounded Shoulders;Decreased lumbar lordosis   ROM / Strength   AROM / PROM / Strength AROM;PROM;Strength   AROM   AROM Assessment Site Lumbar   Lumbar Flexion decreased by 50%   Lumbar Extension decreased by 50%   Lumbar - Right Side Bend decreased 25%   Lumbar - Left Side Bend decreased by 25%   Lumbar - Right Rotation decreased by 25%   Lumbar - Left Rotation decreased by 25%   Flexibility   Soft Tissue Assessment /Muscle Length yes   Obturator Internus decreased length   Palpation   Spinal mobility decreased mobility at L4-L5 PA movement   Palpation comment tendernes with tightness in right diaphgram area; tender in bil. psoas,                  Pelvic Floor Special Questions - 03/20/15 0001    Urinary urgency No   Urinary frequency yes   Skin Integrity Irritation Present at perineal area, below testicles, anal area   External Palpation tenderness located in the anal area, sides of the testicles, ischiocavernosus, bulbocavernosus   Pelvic Floor Internal Exam Patient confimed identification and approved physical therapist to assess anal sphinter strength and integrity   Exam Type Rectal   Sensation intact   Palpation puborectalis pulls slightly with pelvic floor contraction, difficulty pulling up the anal canal with muscle fatique   Strength fair squeeze, definite lift   Strength # of reps 5   Strength # of seconds 1   Tone low tone                  PT Education - 03/20/15 1640    Education provided Yes   Education Details toileting techique, pelvic floor contraction, flexibility exercises   Person(s) Educated Patient   Methods Explanation;Demonstration;Tactile cues;Verbal cues;Handout    Comprehension Returned demonstration;Verbalized understanding          PT Short Term Goals - 03/20/15 1651    PT SHORT TERM GOAL #1   Title understand correct way to toilet to decreased straining   Time 4   Period Weeks   Status New   PT SHORT TERM GOAL #2   Title independent with flexibilty exercises  Time 4   Period Weeks   Status New   PT SHORT TERM GOAL #3   Title understand how to perform self soft tissue work in perineal area to improve blood flow and reduce pain   Time 4   Period Weeks   Status New   PT SHORT TERM GOAL #4   Title lower abdominal pain decreased >/= 25% with activity   Time 4   Period Weeks   Status New           PT Long Term Goals - 03/20/15 1653    PT LONG TERM GOAL #1   Title independent with initial HEP and how to progress himself   Time 4   Period Months   Status New   PT LONG TERM GOAL #2   Title pelvic floor strength 4/5 holding for 10 seconds without fatigue    Time 4   Period Months   Status New   PT LONG TERM GOAL #3   Title ability  to evacuate bowels with >/= 75% greater ease   Time 4   Period Months   Status New   PT LONG TERM GOAL #4   Title lower abdominal pain decreased >/= 75% due to increased tissue mobiity and increased pelvic floor strength   Time 4   Period Months   Status New   PT LONG TERM GOAL #5   Title lumbar pain decreased >/= 75% due to full lumbar ROM   Time 4   Period Months   Status New               Plan - 03/20/15 1642    Clinical Impression Statement Patient is a 54 year old male with diagnosis of anal cancer.  Patient has had chemotherapy, radiation and surgery to remove the cancer 3 years ago.  Patient is experiencing lower abdominal and lumbar pain, unable to have an erection, having to strain to have a bowel movement. Pelvic floor strength is 3/5 with slight lift but not able to hold longer than 1 second.  Slight pull of the puborectalis muscle. Lumbar ROM is limited by 25% with pain.   L4-5 decreased P-A mobility with pain. Patient reports 4/10 pain in lower abdominal area that is a ache and stabing  for 3 years.  Patient reports constant lumbar pain is 7/10  that started suddenly 5 days ago.  Patient sits and stands with reduced lumbar lordosis.  Patient skin in the perineal area is irritated.  Palpable tenderness located in right diaphragm, bil. psoas, and perineal area. The anal sphinter is low tone withou signs of stenosis.  Patient wil benefit from physical therapy to improve pelvic floor strength, decrease pain, improve evacuation of the bowels and improve bowel frequency.    Pt will benefit from skilled therapeutic intervention in order to improve on the following deficits Decreased activity tolerance;Decreased mobility;Decreased strength;Impaired flexibility;Decreased scar mobility;Pain;Increased muscle spasms;Decreased endurance;Decreased range of motion;Increased fascial restricitons;Postural dysfunction   Rehab Potential Good   Clinical Impairments Affecting Rehab Potential None   PT Frequency 1x / week  may come every other week   PT Duration Other (comment)  4 months   PT Treatment/Interventions ADLs/Self Care Home Management;Biofeedback;Functional mobility training;Therapeutic activities;Therapeutic exercise;Neuromuscular re-education;Manual techniques;Patient/family education;Passive range of motion;Other (comment)  may benefit from penile pump to improve erection and promote blood flow   PT Next Visit Plan abdominal massage, butterfly stretch, abdominal exercises, pelvic floor strength, bowel health, how to decrease frequency of bowel movements  PT Home Exercise Plan butterfly stretch, abdominal exercises, abdominal massage   Recommended Other Services None   Consulted and Agree with Plan of Care Patient         Problem List Patient Active Problem List   Diagnosis Date Noted  . Diverticulosis 11/29/2014  . Healthcare maintenance 11/06/2014  . Cough  10/05/2014  . Insomnia 09/13/2014  . Leg pain 09/13/2014  . Erectile dysfunction 11/30/2013  . Chronic pain syndrome 11/30/2013  . Depression 11/30/2013  . GERD (gastroesophageal reflux disease)   . Hypertension   . Anxiety   . Arthritis   . DJD (degenerative joint disease) of lumbar spine   . Radiation   . Back pain 08/12/2012  . Night sweats 08/12/2012  . Tobacco abuse counseling 05/26/2012  . 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 B12 nutritional deficiency 03/19/2012  . Folate deficiency 03/19/2012  . Inguinal hernia 11/24/2011  . Cancer 10/14/2011  . Internal hemorrhoid 09/11/2011  . Anal cancer 10/09/2010    Tasheka Houseman,PT 03/20/2015, 4:58 PM  [facility] [address], [place], [place], [address] Phone: [phone]   Fax:  [phone]

## 2015-03-20 NOTE — Patient Instructions (Signed)
Extension   Lie face down, hands close to chest. Press trunk up, arching back. Keep neck long, shoulders down. Hold _1___ seconds. Repeat _10___ times. Do __3__ sessions per day.  Copyright  VHI. All rights reserved.  Toileting Techniques for Bowel Movements (Defecation) Using your belly (abdomen) and pelvic floor muscles to have a bowel movement is usually instinctive.  Sometimes people can have problems with these muscles and have to relearn proper defecation (emptying) techniques.  If you have weakness in your muscles, organs that are falling out, decreased sensation in your pelvis, or ignore your urge to go, you may find yourself straining to have a bowel movement.  You are straining if you are: . holding your breath or taking in a huge gulp of air and holding it  . keeping your lips and jaw tensed and closed tightly . turning red in the face because of excessive pushing or forcing . developing or worsening your  hemorrhoids . getting faint while pushing . not emptying completely and have to defecate many times a day  If you are straining, you are actually making it harder for yourself to have a bowel movement.  Many people find they are pulling up with the pelvic floor muscles and closing off instead of opening the anus. Due to lack pelvic floor relaxation and coordination the abdominal muscles, one has to work harder to push the feces out.  Many people have never been taught how to defecate efficiently and effectively.  Notice what happens to your body when you are having a bowel movement.  While you are sitting on the toilet pay attention to the following areas: . Jaw and mouth position . Angle of your hips   . Whether your feet touch the ground or not . Arm placement  . Spine position . Waist . Belly tension . Anus (opening of the anal canal)  An Evacuation/Defecation Plan   Here are the 4 basic points:  1. Lean forward enough for your elbows to rest on your knees 2. Support  your feet on the floor or use a low stool if your feet don't touch the floor  3. Push out your belly as if you have swallowed a beach ball-you should feel a widening of your waist 4. Open and relax your pelvic floor muscles, rather than tightening around the anus      The following conditions my require modifications to your toileting posture:  . If you have had surgery in the past that limits your back, hip, pelvic, knee or ankle flexibility . Constipation   Your healthcare practitioner may make the following additional suggestions and adjustments:  1) Sit on the toilet  a) Make sure your feet are supported. b) Notice your hip angle and spine position-most people find it effective to lean forward or raise their knees, which can help the muscles around the anus to relax  c) When you lean forward, place your forearms on your thighs for support  2) Relax suggestions a) Breath deeply in through your nose and out slowly through your mouth as if you are smelling the flowers and blowing out the candles. b) To become aware of how to relax your muscles, contracting and releasing muscles can be helpful.  Pull your pelvic floor muscles in tightly by using the image of holding back gas, or closing around the anus (visualize making a circle smaller) and lifting the anus up and in.  Then release the muscles and your anus should drop down and  feel open. Repeat 5 times ending with the feeling of relaxation. c) Keep your pelvic floor muscles relaxed; let your belly bulge out. d) The digestive tract starts at the mouth and ends at the anal opening, so be sure to relax both ends of the tube.  Place your tongue on the roof of your mouth with your teeth separated.  This helps relax your mouth and will help to relax the anus at the same time.  3) Empty (defecation) a) Keep your pelvic floor and sphincter relaxed, then bulge your anal muscles.  Make the anal opening wide.  b) Stick your belly out as if you have  swallowed a beach ball. c) Make your belly wall hard using your belly muscles while continuing to breathe. Doing this makes it easier to open your anus. d) Breath out and give a grunt (or try using other sounds such as ahhhh, shhhhh, ohhhh or grrrrrrr).  4) Finish a) As you finish your bowel movement, pull the pelvic floor muscles up and in.  This will leave your anus in the proper place rather than remaining pushed out and down. If you leave your anus pushed out and down, it will start to feel as though that is normal and give you incorrect signals about needing to have a bowel movement.    Martin [name], PT [facility] [address] [place], [place] [address]  Quick Contraction: Gravity Eliminated (Hook-Lying)   Lie with hips and knees bent. Quickly squeeze then fully relax pelvic floor. Perform _5__ sets of _1__. Rest for __1_ seconds between sets. Do __3_ times a day.   Copyright  VHI. All rights reserved.  Slow Contraction: Gravity Eliminated (Hook-Lying)   Lie with hips and knees bent. Slowly squeeze pelvic floor for _5__ seconds. Rest for _5__ seconds. Repeat _10__ times. Do __3_ times a day.   Copyright  VHI. All rights reserved.  Knee-to-Chest Stretch: Unilateral   With hand behind right knee, pull knee in to chest until a comfortable stretch is felt in lower back and buttocks. Keep back relaxed. Hold __15__ seconds. Repeat __2__ times per set. Do _1___ sets per session. Do _2___ sessions per day.  http://orth.exer.us/126   Copyright  VHI. All rights reserved.  Knee-to-Chest Stretch: Bilateral   With hands behind knees, pull both knees in to chest until a comfortable stretch is felt in lower back and buttocks. Keep back relaxed. Hold __15__ seconds. Repeat ___2_ times per set. Do _1___ sets per session. Do ____ sessions per day. 1 http://orth.exer.us/128   Copyright  VHI. All rights reserved.  Lumbar Rotation Stretch   Lie on  back with left knee drawn toward chest. Slowly bring bent leg across body until stretch is felt in lower back/hip area. Hold _15___ seconds. Repeat __2__ times per set. Do __1__ sets per session. Do __1__ sessions per day.  http://orth.exer.us/198   Copyright  VHI. All rights reserved.  Backward Bend (Standing)   Arch backward to make hollow of back deeper. Hold 1____ seconds. Repeat _5___ times per set. Do __1__ sets per session. Do __2__ sessions per day.  http://orth.exer.us/178   Copyright  VHI. All rights reserved.  Angry Cat Stretch   Tuck chin and tighten stomach, arching back. Repeat __10__ times per set. Do __1__ sets per session. Do _2___ sessions per day.  http://orth.exer.us/118   Copyright  VHI. All rights reserved.  Piriformis Stretch, Sitting   Sit, one ankle on opposite knee, same-side hand on crossed knee. Push  down on knee, keeping spine straight. Lean torso forward, with flat back, until tension is felt in hamstrings and gluteals of crossed-leg side. Hold 30___ seconds.  Repeat _1__ times per session. Do _2__ sessions per day.  Copyright  VHI. All rights reserved.  [facility] [address] [place], [place] [address] Phone # [phone] Fax [phone]

## 2015-03-28 ENCOUNTER — Telehealth: Payer: Self-pay | Admitting: Oncology

## 2015-03-28 ENCOUNTER — Other Ambulatory Visit: Payer: Self-pay | Admitting: Radiation Oncology

## 2015-03-28 MED ORDER — OXYCODONE-ACETAMINOPHEN 10-325 MG PO TABS: 1.0000 | ORAL_TABLET | ORAL | Status: DC | PRN

## 2015-03-28 NOTE — Telephone Encounter (Addendum)
Martin [name] called and requested a refill on percocet.  He said he will need it tomorrow as he is going out of town on Friday.

## 2015-04-05 ENCOUNTER — Encounter: Payer: No Typology Code available for payment source | Admitting: Physical Therapy

## 2015-04-11 ENCOUNTER — Ambulatory Visit: Payer: No Typology Code available for payment source | Attending: General Surgery | Admitting: Physical Therapy

## 2015-04-11 DIAGNOSIS — N8189 Other female genital prolapse: Secondary | ICD-10-CM

## 2015-04-11 DIAGNOSIS — N8184 Pelvic muscle wasting: Secondary | ICD-10-CM | POA: Insufficient documentation

## 2015-04-11 DIAGNOSIS — M6289 Other specified disorders of muscle: Secondary | ICD-10-CM

## 2015-04-11 NOTE — Patient Instructions (Addendum)
About Abdominal Massage  Abdominal massage, also called external colon massage, is a self-treatment circular massage technique that can reduce and eliminate gas and ease constipation. The colon naturally contracts in waves in a clockwise direction starting from inside the right hip, moving up toward the ribs, across the belly, and down inside the left hip.  When you perform circular abdominal massage, you help stimulate your colon's normal wave pattern of movement called peristalsis.  It is most beneficial when done after eating.  Positioning You can practice abdominal massage with oil while lying down, or in the shower with soap.  Some people find that it is just as effective to do the massage through clothing while sitting or standing.  How to Massage Start by placing your finger tips or knuckles on your right side, just inside your hip bone.  . Make small circular movements while you move upward toward your rib cage.   . Once you reach the bottom right side of your rib cage, take your circular movements across to the left side of the bottom of your rib cage.  . Next, move downward until you reach the inside of your left hip bone.  This is the path your feces travel in your colon. . Continue to perform your abdominal massage in this pattern for 10 minutes each day.     You can apply as much pressure as is comfortable in your massage.  Start gently and build pressure as you continue to practice.  Notice any areas of pain as you massage; areas of slight pain may be relieved as you massage, but if you have areas of significant or intense pain, consult with your healthcare provider.  Other Considerations . General physical activity including bending and stretching can have a beneficial massage-like effect on the colon.  Deep breathing can also stimulate the colon because breathing deeply activates the same nervous system that supplies the colon.   . Abdominal massage should always be used in  combination with a bowel-conscious diet that is high in the proper type of fiber for you, fluids (primarily water), and a regular exercise program.   Sit on the foam roll and rub side to side for 2 min 2 times per day. Quadriceps Stretch   Pull left heel in toward buttocks until a comfortable stretch is felt in front of thigh. Hold __30 sec__ seconds. Repeat __2__ times per set. Do _1___ sets per session. Do _1___ sessions per day. Both sides http://orth.exer.us/154   Copyright  VHI. All rights reserved.  Adductors, Sitting With Hip Flexion   Sit with legs open in a wide V, toes pointing up, hands on knees. Keep spine straight supporting trunk with arms. Slide arms down leg as trunk tips forward. Press knees apart. Hold __30_ seconds. Repeat _2__ times per session. Do _1__ sessions per day.  Copyright  VHI. All rights reserved.  [facility] [address] [place], [place] [address] Phone # [phone] Fax [phone]

## 2015-04-11 NOTE — Therapy (Addendum)
[facility] [address], [place], [place], [address] Phone: [phone]   Fax:  [phone]  Physical Therapy Treatment  Patient Details  Name: Martin [name] MRN: [id number] Date of Birth: 1961-01-23 Referring Provider:  Leighton Ruff, MD  Encounter Date: 04/11/2015      PT End of Session - 04/11/15 1529    Visit Number 2   Date for PT Re-Evaluation 07/21/15   PT Start Time 1529   PT Stop Time 1610   PT Time Calculation (min) 41 min   Activity Tolerance Patient tolerated treatment well   Behavior During Therapy [facility] for tasks assessed/performed      Past Medical History  Diagnosis Date  . Inguinal hernia   . GERD (gastroesophageal reflux disease)   . Hypertension     EKG 12/12 EPIC   no  PCP- states increased lately but hasnt been diagnosed formally  . Pneumonia     as child, cough at present with no fever  . Hemorrhoid     internal  . 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 Allergy 2 2013  . Anxiety   . Arthritis     DDD lumbar, arthritis knees  . DJD (degenerative joint disease) of lumbar spine   . Radiation 11/19/11-01/08/12    5040 cGy 28 fx Pelvis and inguinal area  . Pneumonia     as child, cough at present with no fever     Past Surgical History  Procedure Laterality Date  . Transanal excision  10/14/2011    Dr.Todd Gerkin  . Surgical pathology   10/14/2011    squamous cell ca of anus  . Appendectomy      age 54 or 56  . Examination under anesthesia  10/14/2011    Procedure: EXAM UNDER ANESTHESIA;  Surgeon: Earnstine Regal, MD;  Location: [facility];  Service: General;  Laterality: N/A;  exam under anethesia, Excision of mass anal canal, 1.5cm  . Inguinal hernia repair  05/11/2012    Procedure: HERNIA REPAIR INGUINAL ADULT;  Surgeon: Earnstine Regal, MD;  Location: [facility];  Service: General;  Laterality: Left;  left inguinal hernia repair with mesh     There were no vitals filed for this visit.  Visit Diagnosis:  Pelvic floor weakness  Pelvic floor dysfunction      Subjective Assessment - 04/11/15 1529    Subjective I have been doing the exercise.  My hip has been bothering and back before therapy.  I feel the exercises may aggravate my hip.    Limitations Sitting;Standing   How long can you sit comfortably? several minutes   How long can you stand comfortably? 30 minutes   How long can you walk comfortably? uncomfortable   Patient Stated Goals reduce back pain and improve bowel movements   Currently in Pain? Yes   Pain Score 4    Pain Location Pelvis   Pain Orientation Anterior;Lower   Pain Descriptors / Indicators Aching;Stabbing   Pain Type Chronic pain   Pain Onset More than a month ago   Pain Frequency Constant   Aggravating Factors  sitting, movement   Pain Relieving Factors medication   Effect of Pain on Daily Activities unable to have an erection, pain with activities                         [facility]  Treatment/Exercise - 04/11/15 0001    Manual Therapy   Manual Therapy Myofascial release;Soft tissue mobilization;Joint mobilization   Joint Mobilization P-A and rotational movement to L1-5 in prone   Soft tissue mobilization abdominal, right diaphragm; left obturator and levator ani in prone with hip movements   Myofascial Release lower abdominal area with one hand on abdomen and sacral area                PT Education - 04/11/15 1647    Education provided Yes   Education Details abdominal massage, flexibility exercises; Reviewed toileting technique   Person(s) Educated Patient   Methods Explanation;Demonstration;Tactile cues;Verbal cues;Handout   Comprehension Returned demonstration;Verbalized understanding          PT Short Term Goals - 04/11/15 1653    PT SHORT TERM GOAL #1   Title understand correct way to toilet to decreased straining   Time 4   Period Weeks    Status Achieved   PT SHORT TERM GOAL #2   Title independent with flexibilty exercises    Time 4   Period Weeks   Status On-going  still learning   PT SHORT TERM GOAL #3   Title understand how to perform self soft tissue work in perineal area to improve blood flow and reduce pain   Time 4   Period Weeks   Status On-going   PT SHORT TERM GOAL #4   Title lower abdominal pain decreased >/= 25% with activity   Time 4   Period Weeks   Status On-going  still has pain           PT Long Term Goals - 03/20/15 1653    PT LONG TERM GOAL #1   Title independent with initial HEP and how to progress himself   Time 4   Period Months   Status New   PT LONG TERM GOAL #2   Title pelvic floor strength 4/5 holding for 10 seconds without fatigue    Time 4   Period Months   Status New   PT LONG TERM GOAL #3   Title ability  to evacuate bowels with >/= 75% greater ease   Time 4   Period Months   Status New   PT LONG TERM GOAL #4   Title lower abdominal pain decreased >/= 75% due to increased tissue mobiity and increased pelvic floor strength   Time 4   Period Months   Status New   PT LONG TERM GOAL #5   Title lumbar pain decreased >/= 75% due to full lumbar ROM   Time 4   Period Months   Status New               Plan - 04/11/15 1648    Clinical Impression Statement Patient has tightness in right abdominal wall, left pelvic floor muscles, and lower abdominal.  Patient has difficulty with relaxing her pelvic floor muscles during toileting. Patient has not met goals due to just starting therapy. Patient has a rash around the perineum making it difficult to soft tissue work. Patient would benefit from physical therapy to improve pelvic floor muscle coordination.     Pt will benefit from skilled therapeutic intervention in order to improve on the following deficits Decreased activity tolerance;Decreased mobility;Decreased strength;Impaired flexibility;Decreased scar  mobility;Pain;Increased muscle spasms;Decreased endurance;Decreased range of motion;Increased fascial restricitons;Postural dysfunction   Clinical Impairments Affecting Rehab Potential None   PT Frequency 1x / week   PT Duration Other (comment)  4 months  PT Treatment/Interventions ADLs/Self Care Home Management;Biofeedback;Functional mobility training;Therapeutic activities;Therapeutic exercise;Neuromuscular re-education;Manual techniques;Patient/family education;Passive range of motion;Other (comment)  use of a penile pump   PT Next Visit Plan abdominal exercises, pelvic floor strength, bowel health, how to decrease frequency of bowel movements; relaxation exercises   PT Home Exercise Plan progress as needed   Consulted and Agree with Plan of Care Patient        Problem List Patient Active Problem List   Diagnosis Date Noted  . Diverticulosis 11/29/2014  . Healthcare maintenance 11/06/2014  . Cough 10/05/2014  . Insomnia 09/13/2014  . Leg pain 09/13/2014  . Erectile dysfunction 11/30/2013  . Chronic pain syndrome 11/30/2013  . Depression 11/30/2013  . GERD (gastroesophageal reflux disease)   . Hypertension   . Anxiety   . Arthritis   . DJD (degenerative joint disease) of lumbar spine   . Radiation   . Back pain 08/12/2012  . Night sweats 08/12/2012  . Tobacco abuse counseling 05/26/2012  . 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 B12 nutritional deficiency 03/19/2012  . Folate deficiency 03/19/2012  . Inguinal hernia 11/24/2011  . Cancer 10/14/2011  . Internal hemorrhoid 09/11/2011  . Anal cancer 10/09/2010    GRAY,CHERYL,PT 04/11/2015, 4:55 PM  [facility] [address], [place], [place], [address] Phone: [phone]   Fax:  [phone]     PHYSICAL THERAPY DISCHARGE SUMMARY  Visits from Start of Care: 2  Current functional level related to goals / functional outcomes: See above.   Remaining  deficits: Unable to assess patient due to not returning to physical therapy.  Patient no showed on 05/02/2015 and cancelled appointment on 05/09/2015.    Education / Equipment: HEP  Plan:                                                    Patient goals were not met. Patient is being discharged due to not returning since the last visit. Thank you for the referral. Earlie Counts, PT 05/31/2015 12:55 PM   ?????

## 2015-04-18 ENCOUNTER — Ambulatory Visit: Payer: No Typology Code available for payment source | Admitting: Physical Therapy

## 2015-04-25 ENCOUNTER — Encounter: Payer: No Typology Code available for payment source | Admitting: Physical Therapy

## 2015-05-02 ENCOUNTER — Ambulatory Visit: Payer: No Typology Code available for payment source | Admitting: Physical Therapy

## 2015-05-02 ENCOUNTER — Telehealth: Payer: Self-pay | Admitting: Oncology

## 2015-05-02 NOTE — Telephone Encounter (Addendum)
Called [facility] regarding his pain medication refill request.  Per [place], he last filled the Percocet on 03/31/15.  He said he is taking 3-4 tablets per day and took his last tablet this morning.  He also mentioned that he is "broken out" in his private area.  He has been using radiaplex let over from radiation on the area.  Advised him that Dr. Sondra Come will be notified and we will call him back.

## 2015-05-03 ENCOUNTER — Telehealth: Payer: Self-pay | Admitting: Oncology

## 2015-05-03 ENCOUNTER — Other Ambulatory Visit: Payer: Self-pay | Admitting: Radiation Oncology

## 2015-05-03 MED ORDER — OXYCODONE-ACETAMINOPHEN 10-325 MG PO TABS: 1.0000 | ORAL_TABLET | ORAL | Status: DC | PRN

## 2015-05-03 NOTE — Telephone Encounter (Signed)
Called Martin [name] and advised him that his pain medication prescription is ready for pick up in the Radiation nursing area.  Also asked if he would like to make an appointment with Dr. Sondra Come about his rash.  [name] said he would make an appointment when he comes in to pick up his prescription today.

## 2015-05-09 ENCOUNTER — Encounter: Payer: No Typology Code available for payment source | Admitting: Physical Therapy

## 2015-05-17 ENCOUNTER — Ambulatory Visit
Admission: RE | Admit: 2015-05-17 | Payer: No Typology Code available for payment source | Source: Ambulatory Visit | Admitting: Radiation Oncology

## 2015-05-17 ENCOUNTER — Telehealth: Payer: Self-pay | Admitting: Oncology

## 2015-05-17 NOTE — Telephone Encounter (Signed)
Left a message for Martin [name] regarding his follow up appointment today.  Requested a return call.

## 2015-05-24 ENCOUNTER — Ambulatory Visit
Admission: RE | Admit: 2015-05-24 | Payer: No Typology Code available for payment source | Source: Ambulatory Visit | Admitting: Radiation Oncology

## 2015-06-01 ENCOUNTER — Encounter: Payer: Self-pay | Admitting: *Deleted

## 2015-06-01 ENCOUNTER — Telehealth: Payer: Self-pay | Admitting: Oncology

## 2015-06-01 ENCOUNTER — Other Ambulatory Visit: Payer: Self-pay | Admitting: Radiation Oncology

## 2015-06-01 DIAGNOSIS — C210 Malignant neoplasm of anus, unspecified: Secondary | ICD-10-CM

## 2015-06-01 MED ORDER — OXYCODONE-ACETAMINOPHEN 10-325 MG PO TABS: 1.0000 | ORAL_TABLET | ORAL | Status: DC | PRN

## 2015-06-01 NOTE — Telephone Encounter (Signed)
Martin [name] called and requested a refill on percocet.  He last had it filled on 05/03/15.

## 2015-06-04 ENCOUNTER — Encounter: Payer: Self-pay | Admitting: Radiation Oncology

## 2015-06-04 ENCOUNTER — Ambulatory Visit
Admission: RE | Admit: 2015-06-04 | Discharge: 2015-06-04 | Disposition: A | Discharge status: Home / Self Care | Payer: No Typology Code available for payment source | Source: Ambulatory Visit | Attending: Radiation Oncology | Admitting: Radiation Oncology

## 2015-06-04 VITALS — BP 139/88 | HR 78 | Temp 98.1°F | Resp 20 | Ht 70.0 in | Wt 143.9 lb

## 2015-06-04 DIAGNOSIS — C210 Malignant neoplasm of anus, unspecified: Secondary | ICD-10-CM | POA: Insufficient documentation

## 2015-06-04 MED ORDER — BIAFINE EX EMUL
Freq: Once | CUTANEOUS | Status: AC
  Administered 2015-06-04: 17:00:00 via TOPICAL

## 2015-06-04 NOTE — Progress Notes (Signed)
Radiation Oncology         [phone] ________________________________  Name: Martin [name] MRN: [id number]  Date: 06/04/2015  DOB: 12/08/1960  Follow-Up Visit Note  CC: Georges Lynch, MD  McKeag, Marylynn Pearson, MD    ICD-9-CM ICD-10-CM   1. Anal cancer 154.3 C21.0 topical emolient (BIAFINE) emulsion    Diagnosis:   Anal cancer  Interval Since Last Radiation:  3 years and 4 months   Narrative:  The patient returns today for routine follow-up.  He reports pain at a 2/10 due to a rash "between his legs." He reports it comes and goes and itches. He has tried using neosporin which also makes him break out in a rash. He has also tried athlete's powder, baby powder with no relief. He has been using a cream that he was given during radiation. He does not know the name of it. He also reports pain during and after eating especially breakfast. He has lost 3 lbs since his last visit. He denies diarrhea, constipation and rectal bleeding. He is currently taking 3-4 percocet per day. He has pain clinic appointment in November. He reports his energy level is good and he is working part time.  The patient was seen in physical therapy by Austin Miles but unfortunately the patient only completed 2 out of 5 sessions. He would like to continue with this therapy as he feels this is helpful and will reorder additional sessions.                               ALLERGIES:  is allergic to aspirin; codeine; and morphine and related.  Meds: Current Outpatient Prescriptions  Medication Sig Dispense Refill  . oxyCODONE-acetaminophen (PERCOCET) 10-325 MG per tablet Take 1 tablet by mouth every 4 (four) hours as needed for pain. 100 tablet 0  . Aspirin-Salicylamide-Caffeine (BC FAST PAIN RELIEF) 650-195-33.3 MG PACK Take 1 packet by mouth every 6 (six) hours as needed (for pain).    Marland Kitchen docusate sodium (COLACE) 100 MG capsule Take 100 mg by mouth 2 (two) times daily as needed for mild constipation.    . gabapentin  (NEURONTIN) 300 MG capsule Take 3 capsules (900 mg total) by mouth 3 (three) times daily. (Patient not taking: Reported on 06/04/2015) 270 capsule 5  . omeprazole (PRILOSEC) 20 MG capsule Take 1 capsule (20 mg total) by mouth daily. (Patient not taking: Reported on 06/04/2015) 30 capsule 3  . sildenafil (VIAGRA) 100 MG tablet Take 0.5-1 tablets (50-100 mg total) by mouth daily as needed for erectile dysfunction. (Patient not taking: Reported on 01/30/2015) 5 tablet 11  . traZODone (DESYREL) 50 MG tablet Take 2 tablets (100 mg total) by mouth at bedtime as needed for sleep. (Patient not taking: Reported on 06/04/2015) 60 tablet 5   No current facility-administered medications for this encounter.    Physical Findings: The patient is in no acute distress. Patient is alert and oriented.  height is 5' 10"  (1.778 m) and weight is 143 lb 14.4 oz (65.273 kg). His oral temperature is 98.1 F (36.7 C). His blood pressure is 139/88 and his pulse is 78. His respiration is 20 and oxygen saturation is 99%. .  No palpable subclavicular or axillary adenopathy. Lungs are clear to auscultation. The heart has a regular rhythm and rate. The abdomen is soft and nontender with normal bowel sounds. The inguinal areas are free of adenopathy. The patient is a normal uncircumcised male.  In the perineum area the patient has significant telangiectasias,  the area where he complains of itching. A digital exam is performed. Sphincter tone noted to be normal. There is some mild scar tissue noted along the anterior anal canal but no palpable or visible signs of recurrence within the anal canal or lower rectum or perianal area.  Lab Findings: Lab Results  Component Value Date   WBC 9.3 11/02/2014   HGB 15.4 11/02/2014   HCT 45.5 11/02/2014   MCV 93.0 11/02/2014   PLT 212 11/02/2014    Radiographic Findings: No results found.  Impression:  Clinically NED.  Plan:  Patient will proceed with physical therapy as above. He has  been given Biafine for his skin irritation. I should mention that his use Cortaid which has not been helpful for his pruritus. Patient had refill on his Percocet late last week. He continues on gabapentin through his primary care physician. Patient will return for routine follow-up in 6 months. He will also be seen in the pain clinic in November  ____________________________________ Gery Pray, MD

## 2015-06-04 NOTE — Progress Notes (Signed)
Martin [name] here for follow up.  Martin [name] reports pain at a 2/10 due to a rash "between his legs."  Martin [name] reports it comes and goes and itches.  Martin [name] has tried using neosporin which also makes him break out in a rash.  Martin [name] has also tried athlete's powder, baby powder with no relief.  Martin [name] has been using a cream that Martin [name] was given during radiation.  Martin [name] does not know the name of it.  Martin [name] also reports pain during and after eating especially breakfast.  Martin [name] has lost 3 lbs since his last visit.  Martin [name] denies diarrhea, constipation and rectal bleeding.  Martin [name] is currently taking 3-4 percocet per day.  Martin [name] has pain clinic appointment in November.  Martin [name] reports his energy level is good and Martin [name] is working part time.  BP 139/88 mmHg  Pulse 78  Temp(Src) 98.1 F (36.7 C) (Oral)  Resp 20  Ht 5' 10"  (1.778 m)  Wt 143 lb 14.4 oz (65.273 kg)  BMI 20.65 kg/m2  SpO2 99%   Wt Readings from Last 3 Encounters:  06/04/15 143 lb 14.4 oz (65.273 kg)  06/01/15 142 lb 3.2 oz (64.501 kg)  01/30/15 146 lb 9.6 oz (66.497 kg)

## 2015-06-21 ENCOUNTER — Ambulatory Visit: Payer: No Typology Code available for payment source | Admitting: Physical Therapy

## 2015-06-25 ENCOUNTER — Ambulatory Visit: Payer: No Typology Code available for payment source | Attending: Radiation Oncology | Admitting: Physical Therapy

## 2015-06-29 ENCOUNTER — Telehealth: Payer: Self-pay | Admitting: Oncology

## 2015-06-29 NOTE — Telephone Encounter (Signed)
Loi called requested a refill on his pain medication - Percocet 10/325 mg.  He said he has enough to last until Monday morning.  He last had it filled on 06/01/15.

## 2015-07-02 ENCOUNTER — Other Ambulatory Visit: Payer: Self-pay | Admitting: Radiation Oncology

## 2015-07-02 ENCOUNTER — Telehealth: Payer: Self-pay | Admitting: Oncology

## 2015-07-02 MED ORDER — OXYCODONE-ACETAMINOPHEN 10-325 MG PO TABS: 1.0000 | ORAL_TABLET | ORAL | Status: DC | PRN

## 2015-07-02 NOTE — Telephone Encounter (Addendum)
Called Martin [name] and let him know that Dr. Lisbeth Renshaw will fill his prescription since Dr. Sondra Come is out of the office and it will be available for pick up in the nursing area.  [name] verbalized agreement.

## 2015-07-05 ENCOUNTER — Encounter: Payer: Self-pay | Admitting: Family Medicine

## 2015-07-05 ENCOUNTER — Ambulatory Visit (INDEPENDENT_AMBULATORY_CARE_PROVIDER_SITE_OTHER): Payer: No Typology Code available for payment source | Admitting: Family Medicine

## 2015-07-05 ENCOUNTER — Telehealth: Payer: Self-pay

## 2015-07-05 VITALS — BP 140/88 | HR 75 | Temp 98.4°F | Wt 146.6 lb

## 2015-07-05 DIAGNOSIS — R1013 Epigastric pain: Secondary | ICD-10-CM

## 2015-07-05 LAB — COMPLETE METABOLIC PANEL WITH GFR
ALT: 12 U/L (ref 9–46)
AST: 16 U/L (ref 10–35)
Albumin: 4 g/dL (ref 3.6–5.1)
Alkaline Phosphatase: 87 U/L (ref 40–115)
BILIRUBIN TOTAL: 0.3 mg/dL (ref 0.2–1.2)
BUN: 10 mg/dL (ref 7–25)
CO2: 27 mmol/L (ref 20–31)
CREATININE: 0.83 mg/dL (ref 0.70–1.33)
Calcium: 9 mg/dL (ref 8.6–10.3)
Chloride: 102 mmol/L (ref 98–110)
GFR, Est African American: >89 mL/min (ref >=60)
GFR, Est Non African American: >89 mL/min (ref >=60)
Glucose, Bld: 96 mg/dL (ref 65–99)
Potassium: 4.3 mmol/L (ref 3.5–5.3)
SODIUM: 136 mmol/L (ref 135–146)
TOTAL PROTEIN: 6.7 g/dL (ref 6.1–8.1)

## 2015-07-05 LAB — LIPASE: LIPASE: 99 U/L — AB (ref 7–60)

## 2015-07-05 MED ORDER — OMEPRAZOLE 20 MG PO CPDR: 20.0000 mg | DELAYED_RELEASE_CAPSULE | Freq: Every day | ORAL | Status: DC

## 2015-07-05 MED ORDER — ALUMINUM & MAGNESIUM HYDROXIDE 200-200 MG/5ML PO SUSP: 10.0000 mL | Freq: Four times a day (QID) | ORAL | Status: DC | PRN

## 2015-07-05 NOTE — Telephone Encounter (Signed)
Patient came in as a SDA for stomach problems. Asked about referral for [facility] Clinic. Pt was last seen on Feb 28,2016. Note states [facility] not seeing orange card patients until Nov. Will you please place another referral so pt can be seen at the Pain Management Clinic.  I will advise pt to schedule an appointment to meet you. Ottis Stain, CMA

## 2015-07-05 NOTE — Progress Notes (Signed)
    Subjective   Martin [name] is a 54 y.o. male that presents for a same day visit  1. Abdominal pain: Symptoms started about one week ago. Patient states that symptoms started about 1 week ago. Patient states pain is worse when he eats with associated bloating. Pain is described as stabbing and epigastric. No associated nausea or vomiting. His stools are soft. Patient has tried Tums which has not helped. This morning, he reports drinking some coffee and having some pain. He has a history of chemoradiation for the last three days and has intermittent rectal bleeding. He states he drinks about one beer per week.   ROS Per HPI  Social History  Substance Use Topics  . Smoking status: Current Every Day Smoker -- 0.30 packs/day for 41 years    Types: Cigarettes  . Smokeless tobacco: Never Used     Comment: Cutting back  . Alcohol Use: 3.6 oz/week    6 Cans of beer per week     Comment: weekend    6pk weekend, beer     Allergies  Allergen Reactions  . Aspirin Swelling  . Codeine Nausea Only  . Morphine And Related Itching    Objective   BP 140/88 mmHg  Pulse 75  Temp(Src) 98.4 F (36.9 C) (Oral)  Wt 146 lb 9.6 oz (66.497 kg)  SpO2 98%  General: Well appearing, no distress Gastrointestinal: Epigastric tenderness. Soft, non-distended abdomen with normal bowel sounds  Assessment and Plan   Meds ordered this encounter  Medications  . omeprazole (PRILOSEC) 20 MG capsule    Sig: Take 1 capsule (20 mg total) by mouth daily.    Dispense:  30 capsule    Refill:  1  . aluminum-magnesium hydroxide 200-200 MG/5ML suspension    Sig: Take 10 mLs by mouth every 6 (six) hours as needed for indigestion.    Dispense:  355 mL    Refill:  0   Orders Placed This Encounter  Procedures  . Lipase  . COMPLETE METABOLIC PANEL WITH GFR    Abdominal pain: possible gastritis. Concern for pancreatitis as well. Less likely cholecystitis - refill prilosec - start maalox - labs: CBC;  lipase - return precautions

## 2015-07-05 NOTE — Patient Instructions (Signed)
Thank you for coming to see me today. It was a pleasure. Today we talked about:   Abdominal pain: I will reorder your Prilosec. I am also ordering Magnesium hydroxide (Maalox) for you. In addition, I am getting some lab work to check your pancreas and liver/galbladder. If your symptoms worsen, please return promptly for follow-up. You should expect a call for your results.  Please make an appointment to see Dr. Alease Frame in 1-2 weeks for follow-up.  If you have any questions or concerns, please do not hesitate to call the office at [phone].  Sincerely,  Cordelia Poche, MD

## 2015-07-06 ENCOUNTER — Telehealth: Payer: Self-pay | Admitting: Family Medicine

## 2015-07-06 ENCOUNTER — Other Ambulatory Visit: Payer: Self-pay | Admitting: Family Medicine

## 2015-07-06 DIAGNOSIS — G894 Chronic pain syndrome: Secondary | ICD-10-CM

## 2015-07-06 NOTE — Telephone Encounter (Signed)
Discussed results of elevated lipase and diagnosis of acute pancreatitis. Patient advised to seek immediate medical attention in the Emergency Department for evaluation and management of pancreatitis. Patient currently out of town and states he will not be able to obtain medical attention until Sunday. Discussed risks of not being treated for pancreatitis, including, but not limited to worsening injury to pancreas, hypotension, sepsis, and death. Patient understands and states he will try to get back to [facility] and head to the emergency department when he can.

## 2015-07-06 NOTE — Telephone Encounter (Signed)
Referral made to pain clinic.

## 2015-07-06 NOTE — Telephone Encounter (Signed)
Called pt to inform him of the referral being placed at the pain clinic and to make sure he has an appt with Dr. Alease Frame. Ottis Stain, CMA

## 2015-07-07 ENCOUNTER — Emergency Department (HOSPITAL_COMMUNITY)
Admission: EM | Admit: 2015-07-07 | Discharge: 2015-07-08 | Disposition: A | Discharge status: Home / Self Care | Payer: No Typology Code available for payment source | Attending: Emergency Medicine | Admitting: Emergency Medicine

## 2015-07-07 ENCOUNTER — Emergency Department (HOSPITAL_COMMUNITY): Payer: No Typology Code available for payment source

## 2015-07-07 ENCOUNTER — Encounter (HOSPITAL_COMMUNITY): Payer: Self-pay | Admitting: Emergency Medicine

## 2015-07-07 DIAGNOSIS — F419 Anxiety disorder, unspecified: Secondary | ICD-10-CM | POA: Insufficient documentation

## 2015-07-07 DIAGNOSIS — I10 Essential (primary) hypertension: Secondary | ICD-10-CM | POA: Insufficient documentation

## 2015-07-07 DIAGNOSIS — Z79899 Other long term (current) drug therapy: Secondary | ICD-10-CM | POA: Insufficient documentation

## 2015-07-07 DIAGNOSIS — Z720 Tobacco use: Secondary | ICD-10-CM | POA: Insufficient documentation

## 2015-07-07 DIAGNOSIS — K219 Gastro-esophageal reflux disease without esophagitis: Secondary | ICD-10-CM | POA: Insufficient documentation

## 2015-07-07 DIAGNOSIS — Z8701 Personal history of pneumonia (recurrent): Secondary | ICD-10-CM | POA: Insufficient documentation

## 2015-07-07 DIAGNOSIS — R1013 Epigastric pain: Secondary | ICD-10-CM

## 2015-07-07 DIAGNOSIS — Z8739 Personal history of other diseases of the musculoskeletal system and connective tissue: Secondary | ICD-10-CM | POA: Insufficient documentation

## 2015-07-07 DIAGNOSIS — Z85048 Personal history of other malignant neoplasm of rectum, rectosigmoid junction, and anus: Secondary | ICD-10-CM | POA: Insufficient documentation

## 2015-07-07 LAB — URINALYSIS, ROUTINE W REFLEX MICROSCOPIC
BILIRUBIN URINE: NEGATIVE
GLUCOSE, UA: NEGATIVE mg/dL
Ketones, ur: NEGATIVE mg/dL
Leukocytes, UA: NEGATIVE
NITRITE: NEGATIVE
PH: 6.5 (ref 5.0–8.0)
Protein, ur: NEGATIVE mg/dL
SPECIFIC GRAVITY, URINE: 1.006 (ref 1.005–1.030)
Urobilinogen, UA: 0.2 mg/dL (ref 0.0–1.0)

## 2015-07-07 LAB — CBC WITH DIFFERENTIAL/PLATELET
BASOS ABS: 0.1 10*3/uL (ref 0.0–0.1)
Basophils Relative: 1 %
EOS PCT: 4 %
Eosinophils Absolute: 0.4 10*3/uL (ref 0.0–0.7)
HCT: 40.9 % (ref 39.0–52.0)
Hemoglobin: 14.3 g/dL (ref 13.0–17.0)
LYMPHS PCT: 21 %
Lymphs Abs: 2.4 10*3/uL (ref 0.7–4.0)
MCH: 32.9 pg (ref 26.0–34.0)
MCHC: 35 g/dL (ref 30.0–36.0)
MCV: 94.2 fL (ref 78.0–100.0)
MONO ABS: 0.7 10*3/uL (ref 0.1–1.0)
MONOS PCT: 6 %
Neutro Abs: 7.5 10*3/uL (ref 1.7–7.7)
Neutrophils Relative %: 68 %
Platelets: 208 10*3/uL (ref 150–400)
RBC: 4.34 MIL/uL (ref 4.22–5.81)
RDW: 12.7 % (ref 11.5–15.5)
WBC: 11 10*3/uL — ABNORMAL HIGH (ref 4.0–10.5)

## 2015-07-07 LAB — COMPREHENSIVE METABOLIC PANEL
ALBUMIN: 4.2 g/dL (ref 3.5–5.0)
ALK PHOS: 85 U/L (ref 38–126)
ALT: 13 U/L — AB (ref 17–63)
AST: 16 U/L (ref 15–41)
Anion gap: 8 (ref 5–15)
BILIRUBIN TOTAL: 0.3 mg/dL (ref 0.3–1.2)
BUN: 11 mg/dL (ref 6–20)
CALCIUM: 9.6 mg/dL (ref 8.9–10.3)
CO2: 27 mmol/L (ref 22–32)
CREATININE: 0.9 mg/dL (ref 0.61–1.24)
Chloride: 104 mmol/L (ref 101–111)
GFR calc Af Amer: >60 mL/min (ref >60)
GLUCOSE: 75 mg/dL (ref 65–99)
Potassium: 3.6 mmol/L (ref 3.5–5.1)
Sodium: 139 mmol/L (ref 135–145)
TOTAL PROTEIN: 7.7 g/dL (ref 6.5–8.1)

## 2015-07-07 LAB — URINE MICROSCOPIC-ADD ON

## 2015-07-07 LAB — LIPASE, BLOOD: Lipase: 27 U/L (ref 11–51)

## 2015-07-07 MED ORDER — HYDROMORPHONE HCL 1 MG/ML IJ SOLN
1.0000 mg | Freq: Once | INTRAMUSCULAR | Status: AC
  Administered 2015-07-07: 1 mg via INTRAVENOUS
  Filled 2015-07-07: qty 1

## 2015-07-07 MED ORDER — SODIUM CHLORIDE 0.9 % IV BOLUS (SEPSIS)
1000.0000 mL | Freq: Once | INTRAVENOUS | Status: AC
  Administered 2015-07-07: 1000 mL via INTRAVENOUS

## 2015-07-07 MED ORDER — ONDANSETRON HCL 4 MG/2ML IJ SOLN
4.0000 mg | Freq: Once | INTRAMUSCULAR | Status: AC
  Administered 2015-07-07: 4 mg via INTRAVENOUS
  Filled 2015-07-07: qty 2

## 2015-07-07 MED ORDER — OXYCODONE-ACETAMINOPHEN 5-325 MG PO TABS: 1.0000 | ORAL_TABLET | ORAL | Status: DC | PRN

## 2015-07-07 MED ORDER — ONDANSETRON 4 MG PO TBDP: 4.0000 mg | ORAL_TABLET | Freq: Three times a day (TID) | ORAL | Status: DC | PRN

## 2015-07-07 NOTE — ED Provider Notes (Signed)
CSN: [id number]     Arrival date & time 07/07/15  1939 History   First MD Initiated Contact with Patient 07/07/15 2013     Chief Complaint  Patient presents with  . MD Referred here   . Pancreatitis     (Consider location/radiation/quality/duration/timing/severity/associated sxs/prior Treatment) The history is provided by the patient and medical records.    This is a 54 year old male with history of GERD, hypertension, previous anal cancer status post resection and radiation, anxiety, arthritis, presenting to the ED for abdominal pain. Patient states he saw his PCP on Friday and had lab work done which revealed elevated lipase.  He states he was contacted and encouraged to go to the ED, however he was out of town. He just returned to [facility] this evening and came straight here. He reports he has had intermittent abdominal pain for the past month, substantially worse after eating. He states pain is sharp with radiation to his back. He denies any chest pain or shortness of breath. No nausea or vomiting. No fever or chills. Patient has no history of pancreatitis. He states he used to be a heavy drinker, now only drinks sporadically. He has no known history of gallstones. He states he's been taking Maalox at home as he felt his symptoms were due to acid reflux but is not had any relief. No prior abdominal surgeries. Vital signs stable.  Past Medical History  Diagnosis Date  . Inguinal hernia   . GERD (gastroesophageal reflux disease)   . Hypertension     EKG 12/12 EPIC   no  PCP- states increased lately but hasnt been diagnosed formally  . Pneumonia     as child, cough at present with no fever  . Hemorrhoid     internal  . 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 Allergy 2 2013  . Anxiety   . Arthritis     DDD lumbar, arthritis knees  . DJD (degenerative joint disease) of lumbar spine   . Radiation 11/19/11-01/08/12    5040 cGy 28 fx Pelvis and inguinal  area  . Pneumonia     as child, cough at present with no fever    Past Surgical History  Procedure Laterality Date  . Transanal excision  10/14/2011    Dr.Todd Gerkin  . Surgical pathology   10/14/2011    squamous cell ca of anus  . Appendectomy      age 1 or 68  . Examination under anesthesia  10/14/2011    Procedure: EXAM UNDER ANESTHESIA;  Surgeon: Earnstine Regal, MD;  Location: [facility];  Service: General;  Laterality: N/A;  exam under anethesia, Excision of mass anal canal, 1.5cm  . Inguinal hernia repair  05/11/2012    Procedure: HERNIA REPAIR INGUINAL ADULT;  Surgeon: Earnstine Regal, MD;  Location: [facility];  Service: General;  Laterality: Left;  left inguinal hernia repair with mesh   Family History  Problem Relation Age of Onset  . Colon cancer Father   . Asthma Father   . Cancer Father     prostate  . Hypertension Father   . Asthma Mother   . Hypertension Mother   . Stomach cancer Neg Hx   . Esophageal cancer Neg Hx    Social History  Substance Use Topics  . Smoking status: Current Every Day Smoker -- 0.30 packs/day for 41 years    Types: Cigarettes  . Smokeless tobacco:  Never Used     Comment: Cutting back  . Alcohol Use: 3.6 oz/week    6 Cans of beer per week     Comment: weekend    6pk weekend, beer     Review of Systems  Gastrointestinal: Positive for abdominal pain.  All other systems reviewed and are negative.     Allergies  Aspirin; Codeine; and Morphine and related  Home Medications   Prior to Admission medications   Medication Sig Start Date End Date Taking? Authorizing Provider  aluminum-magnesium hydroxide 200-200 MG/5ML suspension Take 10 mLs by mouth every 6 (six) hours as needed for indigestion. 07/05/15   Mariel Aloe, MD  Aspirin-Salicylamide-Caffeine (BC FAST PAIN RELIEF) [phone] MG PACK Take 1 packet by mouth every 6 (six) hours as needed (for pain).    Historical Provider, MD  docusate sodium (COLACE) 100 MG capsule  Take 100 mg by mouth 2 (two) times daily as needed for mild constipation.    Historical Provider, MD  gabapentin (NEURONTIN) 300 MG capsule Take 3 capsules (900 mg total) by mouth 3 (three) times daily. Patient not taking: Reported on 06/04/2015 11/06/14   Timmothy Euler, MD  omeprazole (PRILOSEC) 20 MG capsule Take 1 capsule (20 mg total) by mouth daily. 07/05/15   Mariel Aloe, MD  oxyCODONE-acetaminophen (PERCOCET) 10-325 MG tablet Take 1 tablet by mouth every 4 (four) hours as needed for pain. 07/02/15   Kyung Rudd, MD  sildenafil (VIAGRA) 100 MG tablet Take 0.5-1 tablets (50-100 mg total) by mouth daily as needed for erectile dysfunction. Patient not taking: Reported on 01/30/2015 10/05/14   Timmothy Euler, MD  traZODone (DESYREL) 50 MG tablet Take 2 tablets (100 mg total) by mouth at bedtime as needed for sleep. Patient not taking: Reported on 06/04/2015 11/06/14   Timmothy Euler, MD   BP 148/78 mmHg  Pulse 84  Temp(Src) 98.5 F (36.9 C) (Oral)  Resp 16  SpO2 100%   Physical Exam  Constitutional: He is oriented to person, place, and time. He appears well-developed and well-nourished.  HENT:  Head: Normocephalic and atraumatic.  Mouth/Throat: Oropharynx is clear and moist.  Eyes: Conjunctivae and EOM are normal. Pupils are equal, round, and reactive to light.  Neck: Normal range of motion.  Cardiovascular: Normal rate, regular rhythm and normal heart sounds.   Pulmonary/Chest: Effort normal and breath sounds normal. No respiratory distress. He has no wheezes.  Abdominal: Soft. Bowel sounds are normal. There is tenderness in the epigastric area. There is no guarding.  Epigastrium TTP without rebound or guarding; no peritonitis  Musculoskeletal: Normal range of motion.  Neurological: He is alert and oriented to person, place, and time.  Skin: Skin is warm and dry.  Psychiatric: He has a normal mood and affect.  Nursing note and vitals reviewed.   ED Course  Procedures  (including critical care time) Labs Review Labs Reviewed  CBC WITH DIFFERENTIAL/PLATELET - Abnormal; Notable for the following:    WBC 11.0 (*)    All other components within normal limits  COMPREHENSIVE METABOLIC PANEL - Abnormal; Notable for the following:    ALT 13 (*)    All other components within normal limits  URINALYSIS, ROUTINE W REFLEX MICROSCOPIC (NOT AT [facility]) - Abnormal; Notable for the following:    APPearance TURBID (*)    Hgb urine dipstick TRACE (*)    All other components within normal limits  LIPASE, BLOOD  URINE MICROSCOPIC-ADD ON    Imaging Review US Abdomen  Complete  07/07/2015  CLINICAL DATA:  One week history of abdominal pain, primarily in epigastric region. History of anal carcinoma EXAM: ULTRASOUND ABDOMEN COMPLETE COMPARISON:  CT abdomen and pelvis October 25, 2014 FINDINGS: Gallbladder: No gallstones or wall thickening visualized. There is no pericholecystic fluid. No sonographic Murphy sign noted. Common bile duct: Diameter: 5 mm. No intrahepatic, common hepatic, or common bile duct dilatation. Liver: No focal lesion identified. Within normal limits in parenchymal echogenicity. IVC: No abnormality visualized. Pancreas: No mass or inflammatory focus. Spleen: Size and appearance within normal limits. Right Kidney: Length: 12.0 cm. Echogenicity within normal limits. No mass or hydronephrosis visualized. Left Kidney: Length: 11.4 cm. Echogenicity within normal limits. No mass or hydronephrosis visualized. Abdominal aorta: No aneurysm visualized. Other findings: No demonstrable ascites. IMPRESSION: Study within normal limits. Electronically Signed   By: Lowella Grip III M.D.   On: 07/07/2015 22:18   I have personally reviewed and evaluated these images and lab results as part of my medical decision-making.   EKG Interpretation None      MDM   Final diagnoses:  Abdominal pain, epigastric   54 year old male here for epigastric abdominal pain. Seen by PCP  on Friday, told had elevated lipase. He reports continued pain. Patient is afebrile, nontoxic. Tenderness in epigastrium without peritonitis.  Remainder of exam is benign. Prior labs from Friday do reveal elevated lipase at 99, repeat lab work today with normal lipase, remainder of labs are also reassuring. Ultrasound was obtained, no abnormalities noted about the pancreas nor any evidence of gallstones.  Patient does admit he was formerly a heavy drinker, occasionally drinks now but none recently.  Patient was treated with IV fluids and Dilaudid with resolution of pain. He states he is feeling well and would like to go home. I feel this is appropriate. Will discharge home with Percocet and Zofran. Encouraged gentle diet over the next 24-48 hrs. Follow-up with PCP on Monday.  Discussed plan with patient, he/she acknowledged understanding and agreed with plan of care.  Return precautions given for new or worsening symptoms.   Larene Pickett, PA-C 07/08/15 0016  Tanna Furry, MD 07/25/15 [phone]

## 2015-07-07 NOTE — ED Notes (Signed)
Pt reports that his doctor Martin [name]) called him Friday and told him to come to the ED and that he needs to be admitted for acute pancreatitis. Was out of town and was just now able to come in. Hx Cancer (finished treatment approx 3 years ago). Pt uses tobacco but denies ETOH use. Denies N/V. Endorses abdominal pain. No other c/c. Has been trying Maalox for relief at home.

## 2015-07-07 NOTE — Discharge Instructions (Signed)
As we discussed your pancreatic enzymes have returned to normal limits. See attached document for more information about pancreatitis and dietary modifications which she should follow for the next few days until symptoms fully resolved. Take the prescribed medication as directed.  Use caution when taking Percocet, he can make you drowsy. Follow-up with your primary care physician next week. Return to the ED for new or worsening symptoms.

## 2015-07-24 ENCOUNTER — Ambulatory Visit: Payer: No Typology Code available for payment source | Admitting: Family Medicine

## 2015-07-26 ENCOUNTER — Encounter: Payer: Self-pay | Admitting: Radiation Oncology

## 2015-07-26 ENCOUNTER — Ambulatory Visit
Admission: RE | Admit: 2015-07-26 | Discharge: 2015-07-26 | Disposition: A | Discharge status: Home / Self Care | Payer: No Typology Code available for payment source | Source: Ambulatory Visit | Attending: Radiation Oncology | Admitting: Radiation Oncology

## 2015-07-26 ENCOUNTER — Telehealth: Payer: Self-pay | Admitting: Oncology

## 2015-07-26 VITALS — BP 153/107 | HR 79 | Temp 97.9°F | Resp 16 | Ht 70.0 in | Wt 147.5 lb

## 2015-07-26 DIAGNOSIS — C210 Malignant neoplasm of anus, unspecified: Secondary | ICD-10-CM

## 2015-07-26 MED ORDER — OXYCODONE-ACETAMINOPHEN 10-325 MG PO TABS: 1.0000 | ORAL_TABLET | ORAL | Status: DC | PRN

## 2015-07-26 NOTE — Telephone Encounter (Signed)
Called Kaoru and notified him that his Percocet script is available for pick up.  He verbalized agreement.

## 2015-07-26 NOTE — Progress Notes (Signed)
Radiation Oncology         [phone] ________________________________  Name: Martin [name] MRN: [id number]  Date: 07/26/2015  DOB: 1961/08/10  Follow-Up Visit Note  CC: Georges Lynch, MD  Armandina Gemma, MD    ICD-9-CM ICD-10-CM   1. Anal cancer ([place]) 154.3 C21.0      Diagnosis:   Anal cancer  Interval Since Last Radiation: 3 years and 6 months. May 2013  Narrative:  The patient returns today for routine follow-up. He reports that he was in the ER on 07/07/15 for pancreatitis. He said this was due to his PCP sending him to the ER for elevated lipase. Labs from his PCP revealed elevated lipase at 99 and repeat lab work at the ER showed normal lipase, remainder of labs were also reassuring. Ultrasound was obtained, no abnormalities noted about the pancreas nor any evidence of gallstones. The patient was treated with IV fluids and Dilaudid with resolution of pain. He was discharged home with Percocet and Zofran.He reports having pain in the center of his chest which is gone now. He is wondering if he needs a referral to GI again. He reports having a good appetite but has pain in his lower abdomen after eating. He is taking 3-4 of the percocet per day and has 12 tablets left. He denies bowel issues and his last bowel movement was last night. He denies having any rectal bleeding. He reports fatigue after working a small amount. He denies hematuria. He reports his "orange card" has expired and he is still trying to be seen by the Pain Clinic.  ALLERGIES:  is allergic to aspirin; codeine; and morphine and related.  Meds: Current Outpatient Prescriptions  Medication Sig Dispense Refill  . aluminum-magnesium hydroxide 200-200 MG/5ML suspension Take 10 mLs by mouth every 6 (six) hours as needed for indigestion. 355 mL 0  . omeprazole (PRILOSEC) 20 MG capsule Take 1 capsule (20 mg total) by mouth daily. 30 capsule 1  . ondansetron (ZOFRAN ODT) 4 MG disintegrating tablet Take 1 tablet (4  mg total) by mouth every 8 (eight) hours as needed for nausea. 10 tablet 0  . oxyCODONE-acetaminophen (PERCOCET) 10-325 MG tablet Take 1 tablet by mouth 3 (three) times daily as needed for pain.   0  . traZODone (DESYREL) 50 MG tablet Take 2 tablets (100 mg total) by mouth at bedtime as needed for sleep. 60 tablet 5  . oxyCODONE-acetaminophen (PERCOCET/ROXICET) 5-325 MG tablet Take 1 tablet by mouth every 4 (four) hours as needed. (Patient not taking: Reported on 07/26/2015) 20 tablet 0  . sildenafil (VIAGRA) 100 MG tablet Take 0.5-1 tablets (50-100 mg total) by mouth daily as needed for erectile dysfunction. (Patient not taking: Reported on 01/30/2015) 5 tablet 11   No current facility-administered medications for this encounter.    Physical Findings: The patient is in no acute distress. Patient is alert and oriented.  height is 5' 10"  (1.778 m) and weight is 147 lb 8 oz (66.906 kg). His oral temperature is 97.9 F (36.6 C). His blood pressure is 153/107 and his pulse is 79. His respiration is 16 and oxygen saturation is 100%. .  No palpable subclavicular or axillary adenopathy. Lungs are clear to auscultation. The heart has a regular rhythm and rate. The abdomen is soft and nontender with normal bowel sounds. The inguinal areas are free of adenopathy. The patient is a normal uncircumcised male. In the perineum area the patient has significant telangiectasias. A digital exam is performed. Sphincter tone  noted to be normal. There is some mild scar tissue noted along the anterior anal canal but no palpable or visible signs of recurrence within the anal canal or lower rectum or perianal area.  Lab Findings: Lab Results  Component Value Date   WBC 11.0* 07/07/2015   HGB 14.3 07/07/2015   HCT 40.9 07/07/2015   MCV 94.2 07/07/2015   PLT 208 07/07/2015    Radiographic Findings: US Abdomen Complete  07/07/2015  CLINICAL DATA:  One week history of abdominal pain, primarily in epigastric region.  History of anal carcinoma EXAM: ULTRASOUND ABDOMEN COMPLETE COMPARISON:  CT abdomen and pelvis October 25, 2014 FINDINGS: Gallbladder: No gallstones or wall thickening visualized. There is no pericholecystic fluid. No sonographic Murphy sign noted. Common bile duct: Diameter: 5 mm. No intrahepatic, common hepatic, or common bile duct dilatation. Liver: No focal lesion identified. Within normal limits in parenchymal echogenicity. IVC: No abnormality visualized. Pancreas: No mass or inflammatory focus. Spleen: Size and appearance within normal limits. Right Kidney: Length: 12.0 cm. Echogenicity within normal limits. No mass or hydronephrosis visualized. Left Kidney: Length: 11.4 cm. Echogenicity within normal limits. No mass or hydronephrosis visualized. Abdominal aorta: No aneurysm visualized. Other findings: No demonstrable ascites. IMPRESSION: Study within normal limits. Electronically Signed   By: Lowella Grip III M.D.   On: 07/07/2015 22:18    Impression:  Clinically NED.  Plan: Routine f/u in 6 months. Pt had a refill of his pain medication. At this time, he still has not been given an appointment at the pain clinic. Approximately been a year since they have been working on it through his family practice physicians.  ____________________________________ Blair Promise, Ph.D., M.D.  This document serves as a record of services personally performed by Gery Pray, MD. It was created on his behalf by Darcus Austin, a trained medical scribe. The creation of this record is based on the scribe's personal observations and the provider's statements to them. This document has been checked and approved by the attending provider.

## 2015-07-26 NOTE — Progress Notes (Signed)
Martin [name] here for follow up.  He reports that he was in the hospital on 07/07/15 for pancreatitis.  He said his PCP sent him to the ER for elevated lipase.  He reports having pain in the center of his chest which is gone now.  He is wondering if he needs a referral to GI again.  He reports having a good appetite but has pain in his lower abdomen after eating.  He is taking 3-4 of the percocet per day and has 12 tablets left.  He denies bowel issues and his last bowel movement was last night.  He denies having any rectal bleeding.  He reports having fatigue after working a small amount.  He reports having a stabbing pain in his left hip with going up steps.  He reports that started last month.  BP 153/107 mmHg  Pulse 79  Temp(Src) 97.9 F (36.6 C) (Oral)  Resp 16  Ht 5' 10"  (1.778 m)  Wt 147 lb 8 oz (66.906 kg)  BMI 21.16 kg/m2  SpO2 100%

## 2015-08-27 ENCOUNTER — Telehealth: Payer: Self-pay | Admitting: Oncology

## 2015-08-27 NOTE — Telephone Encounter (Signed)
[facility] left a message requesting a refill on Percocet 10/325 mg.

## 2015-08-28 ENCOUNTER — Telehealth: Payer: Self-pay | Admitting: Oncology

## 2015-08-28 ENCOUNTER — Other Ambulatory Visit: Payer: Self-pay | Admitting: Radiation Oncology

## 2015-08-28 MED ORDER — OXYCODONE-ACETAMINOPHEN 10-325 MG PO TABS
1.0000 | ORAL_TABLET | ORAL | Status: DC | PRN
Start: 2015-08-28 — End: 2015-09-26

## 2015-08-28 NOTE — Telephone Encounter (Signed)
Called Martin [name] and let him know to pick up his pain prescription tomorrow morning.  Martin [name] verbalized agreement.

## 2015-09-26 ENCOUNTER — Other Ambulatory Visit: Payer: Self-pay | Admitting: Radiation Oncology

## 2015-09-26 ENCOUNTER — Telehealth: Payer: Self-pay | Admitting: Oncology

## 2015-09-26 MED ORDER — OXYCODONE-ACETAMINOPHEN 10-325 MG PO TABS: 1.0000 | ORAL_TABLET | ORAL | Status: DC | PRN

## 2015-09-26 NOTE — Telephone Encounter (Signed)
Martin [name] called and requested a refill of oxycoDONE-acetaminophen (PERCOCET) 10-325 MG tablet.  He last had it filled on 08/28/15.  He reports he still has a few left.

## 2015-09-27 MED FILL — OXYCODONE-APAP 10-325 TAB: 10-325 | 16 days supply | Qty: 100 | Fill #0

## 2015-10-26 ENCOUNTER — Telehealth: Payer: Self-pay | Admitting: Oncology

## 2015-10-26 NOTE — Telephone Encounter (Signed)
Martin [name] called and requested a refill on Percocet.  He said he has enough left to last through Monday.  He last had them filled on 09/26/15.

## 2015-10-30 ENCOUNTER — Other Ambulatory Visit: Payer: Self-pay | Admitting: Radiation Oncology

## 2015-10-30 DIAGNOSIS — C210 Malignant neoplasm of anus, unspecified: Secondary | ICD-10-CM

## 2015-10-30 MED ORDER — OXYCODONE-ACETAMINOPHEN 10-325 MG PO TABS: 1.0000 | ORAL_TABLET | ORAL | Status: DC | PRN

## 2015-10-30 MED FILL — OXYCODONE-APAP 10-325 TAB: 10-325 | 16 days supply | Qty: 100 | Fill #0

## 2015-11-04 ENCOUNTER — Emergency Department (HOSPITAL_COMMUNITY): Payer: No Typology Code available for payment source

## 2015-11-04 ENCOUNTER — Emergency Department (HOSPITAL_COMMUNITY)
Admission: EM | Admit: 2015-11-04 | Discharge: 2015-11-04 | Disposition: A | Discharge status: Home / Self Care | Payer: No Typology Code available for payment source | Attending: Emergency Medicine | Admitting: Emergency Medicine

## 2015-11-04 ENCOUNTER — Encounter (HOSPITAL_COMMUNITY): Payer: Self-pay

## 2015-11-04 DIAGNOSIS — K219 Gastro-esophageal reflux disease without esophagitis: Secondary | ICD-10-CM | POA: Insufficient documentation

## 2015-11-04 DIAGNOSIS — J209 Acute bronchitis, unspecified: Secondary | ICD-10-CM | POA: Insufficient documentation

## 2015-11-04 DIAGNOSIS — Z85048 Personal history of other malignant neoplasm of rectum, rectosigmoid junction, and anus: Secondary | ICD-10-CM | POA: Insufficient documentation

## 2015-11-04 DIAGNOSIS — Z8701 Personal history of pneumonia (recurrent): Secondary | ICD-10-CM | POA: Insufficient documentation

## 2015-11-04 DIAGNOSIS — M1990 Unspecified osteoarthritis, unspecified site: Secondary | ICD-10-CM | POA: Insufficient documentation

## 2015-11-04 DIAGNOSIS — F419 Anxiety disorder, unspecified: Secondary | ICD-10-CM | POA: Insufficient documentation

## 2015-11-04 DIAGNOSIS — Z923 Personal history of irradiation: Secondary | ICD-10-CM | POA: Insufficient documentation

## 2015-11-04 DIAGNOSIS — Z79899 Other long term (current) drug therapy: Secondary | ICD-10-CM | POA: Insufficient documentation

## 2015-11-04 DIAGNOSIS — F17210 Nicotine dependence, cigarettes, uncomplicated: Secondary | ICD-10-CM | POA: Insufficient documentation

## 2015-11-04 DIAGNOSIS — I10 Essential (primary) hypertension: Secondary | ICD-10-CM | POA: Insufficient documentation

## 2015-11-04 MED ORDER — AZITHROMYCIN 250 MG PO TABS: ORAL_TABLET | ORAL | Status: DC

## 2015-11-04 NOTE — Discharge Instructions (Signed)
Zithromax as prescribed.  Continue over-the-counter cough and cold medications as needed for symptomatic relief.  Return to the emergency department if he developed difficulty breathing, severe chest pain, or other new and concerning symptoms.   Acute Bronchitis Bronchitis is inflammation of the airways that extend from the windpipe into the lungs (bronchi). The inflammation often causes mucus to develop. This leads to a cough, which is the most common symptom of bronchitis.  In acute bronchitis, the condition usually develops suddenly and goes away over time, usually in a couple weeks. Smoking, allergies, and asthma can make bronchitis worse. Repeated episodes of bronchitis may cause further lung problems.  CAUSES Acute bronchitis is most often caused by the same virus that causes a cold. The virus can spread from person to person (contagious) through coughing, sneezing, and touching contaminated objects. SIGNS AND SYMPTOMS   Cough.   Fever.   Coughing up mucus.   Body aches.   Chest congestion.   Chills.   Shortness of breath.   Sore throat.  DIAGNOSIS  Acute bronchitis is usually diagnosed through a physical exam. Your health care provider will also ask you questions about your medical history. Tests, such as chest X-rays, are sometimes done to rule out other conditions.  TREATMENT  Acute bronchitis usually goes away in a couple weeks. Oftentimes, no medical treatment is necessary. Medicines are sometimes given for relief of fever or cough. Antibiotic medicines are usually not needed but may be prescribed in certain situations. In some cases, an inhaler may be recommended to help reduce shortness of breath and control the cough. A cool mist vaporizer may also be used to help thin bronchial secretions and make it easier to clear the chest.  HOME CARE INSTRUCTIONS  Get plenty of rest.   Drink enough fluids to keep your urine clear or pale yellow (unless you have a  medical condition that requires fluid restriction). Increasing fluids may help thin your respiratory secretions (sputum) and reduce chest congestion, and it will prevent dehydration.   Take medicines only as directed by your health care provider.  If you were prescribed an antibiotic medicine, finish it all even if you start to feel better.  Avoid smoking and secondhand smoke. Exposure to cigarette smoke or irritating chemicals will make bronchitis worse. If you are a smoker, consider using nicotine gum or skin patches to help control withdrawal symptoms. Quitting smoking will help your lungs heal faster.   Reduce the chances of another bout of acute bronchitis by washing your hands frequently, avoiding people with cold symptoms, and trying not to touch your hands to your mouth, nose, or eyes.   Keep all follow-up visits as directed by your health care provider.  SEEK MEDICAL CARE IF: Your symptoms do not improve after 1 week of treatment.  SEEK IMMEDIATE MEDICAL CARE IF:  You develop an increased fever or chills.   You have chest pain.   You have severe shortness of breath.  You have bloody sputum.   You develop dehydration.  You faint or repeatedly feel like you are going to pass out.  You develop repeated vomiting.  You develop a severe headache. MAKE SURE YOU:   Understand these instructions.  Will watch your condition.  Will get help right away if you are not doing well or get worse.   This information is not intended to replace advice given to you by your health care provider. Make sure you discuss any questions you have with your health care provider.  Document Released: 10/02/2004 Document Revised: 09/15/2014 Document Reviewed: 02/15/2013 Elsevier Interactive Patient Education [employer].

## 2015-11-04 NOTE — ED Notes (Signed)
Pt with cough/night sweats and fatigue x 1 week.  OTC cough meds not working.

## 2015-11-04 NOTE — ED Provider Notes (Signed)
CSN: [id number]     Arrival date & time 11/04/15  1038 History   First MD Initiated Contact with Patient 11/04/15 1103     Chief Complaint  Patient presents with  . Cough  . Night Sweats     (Consider location/radiation/quality/duration/timing/severity/associated sxs/prior Treatment) HPI Comments: Patient is a 55 year old male with history of hypertension. He presents for evaluation of chest congestion and productive cough for one week. He is a smoker.  Patient is a 55 y.o. male presenting with cough. The history is provided by the patient.  Cough Cough characteristics:  Productive Severity:  Moderate Onset quality:  Gradual Duration:  1 week Timing:  Constant Progression:  Worsening Chronicity:  New Smoker: yes   Relieved by:  Nothing Worsened by:  Nothing tried Ineffective treatments:  None tried Associated symptoms: fever   Associated symptoms: no chest pain and no chills     Past Medical History  Diagnosis Date  . Inguinal hernia   . GERD (gastroesophageal reflux disease)   . Hypertension     EKG 12/12 EPIC   no  PCP- states increased lately but hasnt been diagnosed formally  . Pneumonia     as child, cough at present with no fever  . Hemorrhoid     internal  . 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 Allergy 2 2013  . Anxiety   . Arthritis     DDD lumbar, arthritis knees  . DJD (degenerative joint disease) of lumbar spine   . Radiation 11/19/11-01/08/12    5040 cGy 28 fx Pelvis and inguinal area  . Pneumonia     as child, cough at present with no fever    Past Surgical History  Procedure Laterality Date  . Transanal excision  10/14/2011    Dr.Todd Gerkin  . Surgical pathology   10/14/2011    squamous cell ca of anus  . Appendectomy      age 53 or 75  . Examination under anesthesia  10/14/2011    Procedure: EXAM UNDER ANESTHESIA;  Surgeon: Earnstine Regal, MD;  Location: [facility];  Service: General;  Laterality: N/A;  exam under  anethesia, Excision of mass anal canal, 1.5cm  . Inguinal hernia repair  05/11/2012    Procedure: HERNIA REPAIR INGUINAL ADULT;  Surgeon: Earnstine Regal, MD;  Location: [facility];  Service: General;  Laterality: Left;  left inguinal hernia repair with mesh   Family History  Problem Relation Age of Onset  . Colon cancer Father   . Asthma Father   . Cancer Father     prostate  . Hypertension Father   . Asthma Mother   . Hypertension Mother   . Stomach cancer Neg Hx   . Esophageal cancer Neg Hx    Social History  Substance Use Topics  . Smoking status: Current Every Day Smoker -- 0.30 packs/day for 41 years    Types: Cigarettes  . Smokeless tobacco: Never Used     Comment: Cutting back  . Alcohol Use: 3.6 oz/week    6 Cans of beer per week     Comment: weekend    6pk weekend, beer     Review of Systems  Constitutional: Positive for fever. Negative for chills.  Respiratory: Positive for cough.   Cardiovascular: Negative for chest pain.  All other systems reviewed and are negative.     Allergies  Aspirin; Codeine; and Morphine and related  Home Medications   Prior to Admission medications   Medication Sig Start Date End Date Taking? Authorizing Provider  aluminum-magnesium hydroxide 200-200 MG/5ML suspension Take 10 mLs by mouth every 6 (six) hours as needed for indigestion. 07/05/15  Yes Mariel Aloe, MD  DM-Phenylephrine-Acetaminophen [facility]) 10-5-325 MG/15ML LIQD Take 30 mLs by mouth every 6 (six) hours as needed (cold symptoms a).   Yes Historical Provider, MD  omeprazole (PRILOSEC) 20 MG capsule Take 1 capsule (20 mg total) by mouth daily. 07/05/15  Yes Mariel Aloe, MD  ondansetron (ZOFRAN ODT) 4 MG disintegrating tablet Take 1 tablet (4 mg total) by mouth every 8 (eight) hours as needed for nausea. 07/07/15  Yes Larene Pickett, PA-C  oxyCODONE-acetaminophen (PERCOCET) 10-325 MG tablet Take 1 tablet by mouth every 4 (four) hours as needed  for pain. 10/30/15  Yes Gery Pray, MD  Phenyleph-Doxylamine-DM-APAP (NYQUIL SEVERE COLD/FLU) 5-6.25-10-325 MG/15ML LIQD Take 30 mLs by mouth at bedtime as needed (cold symptoms).   Yes Historical Provider, MD  sildenafil (VIAGRA) 100 MG tablet Take 0.5-1 tablets (50-100 mg total) by mouth daily as needed for erectile dysfunction. Patient not taking: Reported on 01/30/2015 10/05/14   Timmothy Euler, MD  traZODone (DESYREL) 50 MG tablet Take 2 tablets (100 mg total) by mouth at bedtime as needed for sleep. 11/06/14   Timmothy Euler, MD   BP 147/89 mmHg  Pulse 91  Temp(Src) 98.5 F (36.9 C) (Oral)  Resp 18  SpO2 98% Physical Exam  Constitutional: He is oriented to person, place, and time. He appears well-developed and well-nourished. No distress.  HENT:  Head: Normocephalic and atraumatic.  Mouth/Throat: Oropharynx is clear and moist.  Neck: Normal range of motion. Neck supple.  Cardiovascular: Normal rate, regular rhythm and normal heart sounds.   Pulmonary/Chest: Effort normal and breath sounds normal. No respiratory distress. He has no wheezes. He has no rales.  Abdominal: Soft. Bowel sounds are normal. He exhibits no distension. There is no tenderness.  Musculoskeletal: Normal range of motion. He exhibits no edema.  Lymphadenopathy:    He has no cervical adenopathy.  Neurological: He is alert and oriented to person, place, and time.  Skin: Skin is warm and dry. He is not diaphoretic.  Nursing note and vitals reviewed.   ED Course  Procedures (including critical care time) Labs Review Labs Reviewed - No data to display  Imaging Review No results found. I have personally reviewed and evaluated these images and lab results as part of my medical decision-making.    MDM   Final diagnoses:  None    Chest x-ray is negative for pneumonia. This patient has had a worsening productive cough for greater than one week. He will be treated with Zithromax and continued  over-the-counter medications. Return as needed for any problems.    Veryl Speak, MD 11/04/15 1224

## 2015-11-26 ENCOUNTER — Telehealth: Payer: Self-pay | Admitting: Oncology

## 2015-11-26 NOTE — Telephone Encounter (Signed)
Martin [name] left a message requesting a refill on Percocet.  He last had it filled on 10/30/15 and said he is out of medication today.

## 2015-11-27 ENCOUNTER — Other Ambulatory Visit: Payer: Self-pay | Admitting: Radiation Oncology

## 2015-11-27 ENCOUNTER — Telehealth: Payer: Self-pay | Admitting: *Deleted

## 2015-11-27 MED ORDER — OXYCODONE-ACETAMINOPHEN 10-325 MG PO TABS: 1.0000 | ORAL_TABLET | ORAL | Status: DC | PRN

## 2015-11-27 MED FILL — OXYCODONE-APAP 10-325 TAB: 10-325 | 16 days supply | Qty: 100 | Fill #0

## 2015-11-27 NOTE — Telephone Encounter (Signed)
Called patient to inform that script is ready for pick-up, spoke with patient and he is aware of this

## 2015-12-26 ENCOUNTER — Telehealth: Payer: Self-pay | Admitting: Oncology

## 2015-12-26 NOTE — Telephone Encounter (Signed)
Martin [name] left a message requesting a refill on his pain medication.  He last had it filled on 11/27/15.

## 2015-12-27 ENCOUNTER — Telehealth: Payer: Self-pay | Admitting: Oncology

## 2015-12-27 ENCOUNTER — Other Ambulatory Visit: Payer: Self-pay | Admitting: Radiation Oncology

## 2015-12-27 MED ORDER — OXYCODONE-ACETAMINOPHEN 10-325 MG PO TABS: 1.0000 | ORAL_TABLET | ORAL | Status: DC | PRN

## 2015-12-27 MED FILL — OXYCODONE-APAP 10-325 TAB: 10-325 | 16 days supply | Qty: 100 | Fill #0

## 2015-12-27 NOTE — Telephone Encounter (Signed)
Called Martin [name] and let him know that his pain medication script is ready.  Also asked if he has been accepted into the pain clinic.  He said he was not able to go because he does not have insurance.  He also said he did not qualify for the orange card.

## 2016-01-24 ENCOUNTER — Encounter: Payer: Self-pay | Admitting: Radiation Oncology

## 2016-01-24 ENCOUNTER — Ambulatory Visit
Admission: RE | Admit: 2016-01-24 | Discharge: 2016-01-24 | Disposition: A | Discharge status: Home / Self Care | Payer: No Typology Code available for payment source | Source: Ambulatory Visit | Attending: Radiation Oncology | Admitting: Radiation Oncology

## 2016-01-24 VITALS — BP 145/85 | HR 75 | Temp 97.9°F | Resp 20 | Ht 70.0 in | Wt 147.4 lb

## 2016-01-24 DIAGNOSIS — C210 Malignant neoplasm of anus, unspecified: Secondary | ICD-10-CM

## 2016-01-24 MED ORDER — ONDANSETRON 4 MG PO TBDP: 4.0000 mg | ORAL_TABLET | Freq: Three times a day (TID) | ORAL | Status: DC | PRN

## 2016-01-24 MED ORDER — OXYCODONE-ACETAMINOPHEN 10-325 MG PO TABS: 1.0000 | ORAL_TABLET | ORAL | Status: DC | PRN

## 2016-01-24 MED FILL — OXYCODONE-APAP 10-325 TAB: 10-325 | 16 days supply | Qty: 100 | Fill #0

## 2016-01-24 NOTE — Progress Notes (Signed)
[name]   Radiation Oncology         [phone]) [phone] ________________________________  Name: Martin [name] MRN: [id number]  Date: 01/24/2016  DOB: 09-Jan-1961  Follow-Up Visit Note  CC: Georges Lynch, MD  Armandina Gemma, MD    ICD-9-CM ICD-10-CM   1. Anal cancer ([place]) 154.3 C21.0     Diagnosis:  Anal cancer  Interval Since Last Radiation: 4 years 2 weeks; 11/19/2011 through 01/08/2012  Narrative:  The patient returns today for routine follow-up. He reports a pain level at a 2/10 in the rectal area. He states that he took Percocet this morning before coming. He reports that he has stomach pains and nausea after eating breakfast. He has bowel movements daily. He states that he has a rash that he believes may be due to radiation. He reports having pain when sitting down and when having bowel movements. Today, he is asking for a refill on Percocet as well as a prescription for nausea medication. He states that he is not taking anything other than Percocet now. He reports feeling very weak and fatigued. He is still smoking 1/2ppd cigarettes and drinking coffee. He states that he is out of Prilosec for heartburn. He also reports chronic leg pain. He stopped taking Neurontin as he felt this was not helpful for his pain or  restless legs.   ALLERGIES:  is allergic to aspirin; codeine; and morphine and related.  Meds: Current Outpatient Prescriptions  Medication Sig Dispense Refill  . oxyCODONE-acetaminophen (PERCOCET) 10-325 MG tablet Take 1 tablet by mouth every 4 (four) hours as needed for pain. 100 tablet 0  . aluminum-magnesium hydroxide 200-200 MG/5ML suspension Take 10 mLs by mouth every 6 (six) hours as needed for indigestion. (Patient not taking: Reported on 01/24/2016) 355 mL 0  . azithromycin (ZITHROMAX Z-PAK) 250 MG tablet 2 po day one, then 1 daily x 4 days (Patient not taking: Reported on 01/24/2016) 6 tablet 0  . DM-Phenylephrine-Acetaminophen (THERAFLU EXPRESSMAX) 10-5-325 MG/15ML LIQD Take  30 mLs by mouth every 6 (six) hours as needed (cold symptoms a). Reported on 01/24/2016    . omeprazole (PRILOSEC) 20 MG capsule Take 1 capsule (20 mg total) by mouth daily. (Patient not taking: Reported on 01/24/2016) 30 capsule 1  . ondansetron (ZOFRAN ODT) 4 MG disintegrating tablet Take 1 tablet (4 mg total) by mouth every 8 (eight) hours as needed for nausea. 10 tablet 0  . Phenyleph-Doxylamine-DM-APAP (NYQUIL SEVERE COLD/FLU) 5-6.25-10-325 MG/15ML LIQD Take 30 mLs by mouth at bedtime as needed (cold symptoms). Reported on 01/24/2016    . sildenafil (VIAGRA) 100 MG tablet Take 0.5-1 tablets (50-100 mg total) by mouth daily as needed for erectile dysfunction. (Patient not taking: Reported on 01/30/2015) 5 tablet 11  . traZODone (DESYREL) 50 MG tablet Take 2 tablets (100 mg total) by mouth at bedtime as needed for sleep. (Patient not taking: Reported on 01/24/2016) 60 tablet 5   No current facility-administered medications for this encounter.    Physical Findings: The patient is in no acute distress. Patient is alert and oriented.  height is 5' 10"  (1.778 m) and weight is 147 lb 6.4 oz (66.86 kg). His oral temperature is 97.9 F (36.6 C). His blood pressure is 145/85 and his pulse is 75. His respiration is 20.    No palpable subclavicular or axillary adenopathy. Lungs are clear to auscultation. The heart has a regular rhythm and rate. The abdomen is soft and nontender with normal bowel sounds. The inguinal areas are free of  adenopathy. The patient is a normal uncircumcised male. In the perineum area the patient has significant telangiectasias. A digital exam is performed. Sphincter tone noted to be normal. There is some mild scar tissue noted along the anterior anal canal but no palpable or visible signs of recurrence within the anal canal or lower rectum or perianal area.  Lab Findings: Lab Results  Component Value Date   WBC 11.0* 07/07/2015   HGB 14.3 07/07/2015   HCT 40.9 07/07/2015   MCV  94.2 07/07/2015   PLT 208 07/07/2015    Radiographic Findings: No results found.  Impression:  Clinically NED.  Plan: Routine f/u in 6 months. Refilled Percocet for chronic pain issues. Also prescribed Zofran for nausea. Reccommended he follow up with his PCP if nausea continues.   -----------------------------------  Blair Promise, PhD, MD  This document serves as a record of services personally performed by Gery Pray, MD. It was created on his behalf by Jenell Milliner, a trained medical scribe. The creation of this record is based on the scribe's personal observations and the provider's statements to them. This document has been checked and approved by the attending provider.

## 2016-01-24 NOTE — Progress Notes (Signed)
Follow up s/p radiation  To anal cancer 11/19/11-01/08/12 , pain level 2/10 scale rectal area ,took percocet this am before coming, has stomach pains and nausea after eating breakfast, las bowel movements daily hurts when sitting down and having bowel movements, asking for refill on percocet and nausea medication, not taking anything other than percocet now, very weak and fatigued stated, still smoking 1/2ppd cigarettes and drinking coffee, out of prilosec for heartburn 10:20 AM BP 145/85 mmHg  Pulse 75  Temp(Src) 97.9 F (36.6 C) (Oral)  Resp 20  Ht 5' 10"  (1.778 m)  Wt 147 lb 6.4 oz (66.86 kg)  BMI 21.15 kg/m2  Wt Readings from Last 3 Encounters:  01/24/16 147 lb 6.4 oz (66.86 kg)  07/26/15 147 lb 8 oz (66.906 kg)  07/05/15 146 lb 9.6 oz (66.497 kg)

## 2016-02-25 ENCOUNTER — Other Ambulatory Visit: Payer: Self-pay | Admitting: Radiation Oncology

## 2016-02-25 ENCOUNTER — Telehealth: Payer: Self-pay | Admitting: Oncology

## 2016-02-25 DIAGNOSIS — C210 Malignant neoplasm of anus, unspecified: Secondary | ICD-10-CM

## 2016-02-25 MED ORDER — OXYCODONE-ACETAMINOPHEN 10-325 MG PO TABS: 1.0000 | ORAL_TABLET | ORAL | Status: DC | PRN

## 2016-02-25 NOTE — Telephone Encounter (Signed)
Martin [name] left a message requesting a refill on his pain medication (oxyCODONE-acetaminophen (PERCOCET) 10-325 MG tablet).  He last had it filled on 01/24/16.  Called [name] back and he said he is taking 3 tablets per day.  He said he does not have insurance and has not been able to get in to a pain management clinic.

## 2016-02-26 MED FILL — OXYCODONE-APAP 10-325 TAB: 10-325 | 16 days supply | Qty: 100 | Fill #0

## 2016-02-26 NOTE — Telephone Encounter (Signed)
Called Martin [name] and advised him that the prescription for his pain medication is available for pick up in the Radiation nursing station.  Martin [name] verbalized agreement and understanding.

## 2016-03-25 ENCOUNTER — Telehealth: Payer: Self-pay | Admitting: Oncology

## 2016-03-25 NOTE — Telephone Encounter (Signed)
Martin [name] left a message requesting a refill on his pain medication.  He said he will be out on 03/27/16.  He last had it filled on 02/25/16.

## 2016-03-27 ENCOUNTER — Other Ambulatory Visit: Payer: Self-pay | Admitting: Radiation Oncology

## 2016-03-27 ENCOUNTER — Telehealth: Payer: Self-pay | Admitting: Oncology

## 2016-03-27 DIAGNOSIS — C210 Malignant neoplasm of anus, unspecified: Secondary | ICD-10-CM

## 2016-03-27 MED ORDER — OXYCODONE-ACETAMINOPHEN 10-325 MG PO TABS: 1.0000 | ORAL_TABLET | ORAL | Status: DC | PRN

## 2016-03-27 MED FILL — OXYCODONE-APAP 10-325 TAB: 10-325 | 16 days supply | Qty: 100 | Fill #0

## 2016-03-27 NOTE — Telephone Encounter (Signed)
Called [facility] and notified him that his pain medication prescription is ready for pick up in the Radiation nursing area.  Garmon verbalized agreement.

## 2016-04-06 ENCOUNTER — Emergency Department (HOSPITAL_COMMUNITY)
Admission: EM | Admit: 2016-04-06 | Discharge: 2016-04-06 | Disposition: A | Discharge status: Home / Self Care | Payer: No Typology Code available for payment source | Attending: Emergency Medicine | Admitting: Emergency Medicine

## 2016-04-06 ENCOUNTER — Encounter (HOSPITAL_COMMUNITY): Payer: Self-pay | Admitting: Emergency Medicine

## 2016-04-06 DIAGNOSIS — F17210 Nicotine dependence, cigarettes, uncomplicated: Secondary | ICD-10-CM | POA: Insufficient documentation

## 2016-04-06 DIAGNOSIS — Z85048 Personal history of other malignant neoplasm of rectum, rectosigmoid junction, and anus: Secondary | ICD-10-CM | POA: Insufficient documentation

## 2016-04-06 DIAGNOSIS — I10 Essential (primary) hypertension: Secondary | ICD-10-CM | POA: Insufficient documentation

## 2016-04-06 DIAGNOSIS — K029 Dental caries, unspecified: Secondary | ICD-10-CM | POA: Insufficient documentation

## 2016-04-06 MED ORDER — AMOXICILLIN 500 MG PO CAPS: 500.0000 mg | ORAL_CAPSULE | Freq: Two times a day (BID) | ORAL | 0 refills | Status: DC

## 2016-04-06 MED ORDER — LIDOCAINE VISCOUS 2 % MT SOLN: 15.0000 mL | OROMUCOSAL | 0 refills | Status: DC | PRN

## 2016-04-06 NOTE — ED Provider Notes (Signed)
[facility] DEPT Provider Note   CSN: [id number] Arrival date & time: 04/06/16  1237  First Provider Contact:   First MD Initiated Contact with Patient 04/06/16 1410         By signing my name below, I, Martin [name], attest that this documentation has been prepared under the direction and in the presence of Martin Joy, PA-C. Electronically Signed: Hansel [name], ED Scribe. 04/06/16. 2:26 PM.    History   Chief Complaint Chief Complaint  Patient presents with  . Dental Pain    HPI Martin [name] is a 55 y.o. male who presents to the Emergency Department complaining of moderate right-sided lower dental pain onset a week ago. He reports his pain is worsened with swallowing and palpation. Pt states he has tried warm compress and rinsed with saltwater with no relief of symptoms. No alleviating factors noted. He reports frequent h/o similar symptoms, for which he has previously been treated with antibiotics. Pt is not currently followed by dentistry. He denies fever, chills, difficulty swallowing or tolerating secretions, or any other complaints.    The history is provided by the patient. No language interpreter was used.    Past Medical History:  Diagnosis Date  . Allergy 2 2013  . 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 Anxiety   . Arthritis    DDD lumbar, arthritis knees  . DJD (degenerative joint disease) of lumbar spine   . GERD (gastroesophageal reflux disease)   . Hemorrhoid    internal  . Hypertension    EKG 12/12 EPIC   no  PCP- states increased lately but hasnt been diagnosed formally  . Inguinal hernia   . Pneumonia    as child, cough at present with no fever  . Pneumonia    as child, cough at present with no fever   . Radiation 11/19/11-01/08/12   5040 cGy 28 fx Pelvis and inguinal area    Patient Active Problem List   Diagnosis Date Noted  . Diverticulosis 11/29/2014  . Healthcare maintenance 11/06/2014  . Cough 10/05/2014  .  Insomnia 09/13/2014  . Leg pain 09/13/2014  . Erectile dysfunction 11/30/2013  . Chronic pain syndrome 11/30/2013  . Depression 11/30/2013  . GERD (gastroesophageal reflux disease)   . Hypertension   . Anxiety   . Arthritis   . DJD (degenerative joint disease) of lumbar spine   . Radiation   . Back pain 08/12/2012  . Night sweats 08/12/2012  . Tobacco abuse counseling 05/26/2012  . 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 B12 nutritional deficiency 03/19/2012  . Folate deficiency 03/19/2012  . Inguinal hernia 11/24/2011  . Cancer ([place]) 10/14/2011  . Internal hemorrhoid 09/11/2011  . Anal cancer ([facility]) 10/09/2010    Past Surgical History:  Procedure Laterality Date  . APPENDECTOMY     age 57 or 18  . EXAMINATION UNDER ANESTHESIA  10/14/2011   Procedure: EXAM UNDER ANESTHESIA;  Surgeon: Martin Regal, MD;  Location: [facility];  Service: General;  Laterality: N/A;  exam under anethesia, Excision of mass anal canal, 1.5cm  . INGUINAL HERNIA REPAIR  05/11/2012   Procedure: HERNIA REPAIR INGUINAL ADULT;  Surgeon: Martin Regal, MD;  Location: [facility];  Service: General;  Laterality: Left;  left inguinal hernia repair with mesh  . surgical pathology   10/14/2011   squamous cell ca of anus  . transanal excision  10/14/2011   Dr.Todd Harlow [name]  Home Medications    Prior to Admission medications   Medication Sig Start Date End Date Taking? Authorizing Provider  aluminum-magnesium hydroxide 200-200 MG/5ML suspension Take 10 mLs by mouth every 6 (six) hours as needed for indigestion. Patient not taking: Reported on 01/24/2016 07/05/15   Martin Aloe, MD  amoxicillin (AMOXIL) 500 MG capsule Take 1 capsule (500 mg total) by mouth 2 (two) times daily. 04/06/16   Martin C Joy, PA-C  azithromycin (ZITHROMAX Z-PAK) 250 MG tablet 2 po day one, then 1 daily x 4 days Patient not taking: Reported on 01/24/2016 11/04/15   Martin Speak, MD  DM-Phenylephrine-Acetaminophen [facility])  10-5-325 MG/15ML LIQD Take 30 mLs by mouth every 6 (six) hours as needed (cold symptoms a). Reported on 01/24/2016    Historical Provider, MD  lidocaine (XYLOCAINE) 2 % solution Use as directed 15 mLs in the mouth or throat as needed for mouth pain. 04/06/16   Martin C Joy, PA-C  omeprazole (PRILOSEC) 20 MG capsule Take 1 capsule (20 mg total) by mouth daily. Patient not taking: Reported on 01/24/2016 07/05/15   Martin Aloe, MD  ondansetron (ZOFRAN ODT) 4 MG disintegrating tablet Take 1 tablet (4 mg total) by mouth every 8 (eight) hours as needed for nausea. 01/24/16   Martin Pray, MD  oxyCODONE-acetaminophen (PERCOCET) 10-325 MG tablet Take 1 tablet by mouth every 4 (four) hours as needed for pain. 03/27/16   Martin Pray, MD  Phenyleph-Doxylamine-DM-APAP (NYQUIL SEVERE COLD/FLU) 5-6.25-10-325 MG/15ML LIQD Take 30 mLs by mouth at bedtime as needed (cold symptoms). Reported on 01/24/2016    Historical Provider, MD  sildenafil (VIAGRA) 100 MG tablet Take 0.5-1 tablets (50-100 mg total) by mouth daily as needed for erectile dysfunction. Patient not taking: Reported on 01/30/2015 10/05/14   Martin Euler, MD  traZODone (DESYREL) 50 MG tablet Take 2 tablets (100 mg total) by mouth at bedtime as needed for sleep. Patient not taking: Reported on 01/24/2016 11/06/14   Martin Euler, MD    Family History Family History  Problem Relation Age of Onset  . Colon cancer Father   . Asthma Father   . Cancer Father     prostate  . Hypertension Father   . Asthma Mother   . Hypertension Mother   . Stomach cancer Neg Hx   . Esophageal cancer Neg Hx     Social History Social History  Substance Use Topics  . Smoking status: Current Every Day Smoker    Packs/day: 0.30    Years: 41.00    Types: Cigarettes  . Smokeless tobacco: Never Used     Comment: Cutting back  . Alcohol use 3.6 oz/week    6 Cans of beer per week     Comment: weekend    6pk weekend, beer      Allergies   Aspirin; Codeine; and  Morphine and related   Review of Systems Review of Systems  Constitutional: Negative for chills and fever.  HENT: Positive for dental problem. Negative for facial swelling, trouble swallowing and voice change.      Physical Exam Updated Vital Signs BP (!) 170/103 (BP Location: Left Arm)   Pulse 86   Temp 98.2 F (36.8 C)   Resp 20   Ht 5' 11"  (1.803 m)   Wt 68 kg   SpO2 97%   BMI 20.92 kg/m   Physical Exam  Constitutional: He appears well-developed and well-nourished.  HENT:  Head: Normocephalic.  Mouth/Throat: Dental caries present. No dental abscesses.  Extensive dental caries throughout the entire mouth with multiple teeth missing. Tenderness to the right mandibular 2nd molar and 1st premolar. No area of swelling or fluctuance to suggest an abscess. No discernable facial swelling.   Eyes: Conjunctivae are normal.  Neck:  No cervical lymphadenopathy.   Cardiovascular: Normal rate.   Pulmonary/Chest: Effort normal. No respiratory distress.  Abdominal: He exhibits no distension.  Musculoskeletal: Normal range of motion.  Lymphadenopathy:    He has no cervical adenopathy.  Neurological: He is alert.  Skin: Skin is warm and dry.  Psychiatric: He has a normal mood and affect. His behavior is normal.  Nursing note and vitals reviewed.    ED Treatments / Results  Labs (all labs ordered are listed, but only abnormal results are displayed) Labs Reviewed - No data to display  EKG  EKG Interpretation None       Radiology No results found.  Procedures Procedures (including critical care time)  DIAGNOSTIC STUDIES: Oxygen Saturation is 97% on RA, normal by my interpretation.    COORDINATION OF CARE: 2:14 PM Discussed treatment plan with pt at bedside which includes dental f/u, antibiotics and pt agreed to plan. Pt was offered a dental block and declined.     Medications Ordered in ED Medications - No data to display   Initial Impression / Assessment and  Plan / ED Course  I have reviewed the triage vital signs and the nursing notes.  Pertinent labs & imaging results that were available during my care of the patient were reviewed by me and considered in my medical decision making (see chart for details).  Clinical Course    Martin [name] presents with dental pain for the last week.  Dental pain without signs of systemic symptoms. Exam is not concerning for Ludwig's angioedema or sepsis. Patient encouraged to follow-up with a dentist as soon as possible. Resources given. Patient was offered a dental block with explanation of the benefits, patient declined. Hypertension noted. Patient has no signs of hypertensive emergency. The patient was given instructions for home care as well as return precautions. Patient voices understanding of these instructions, accepts the plan, and is comfortable with discharge.   Final Clinical Impressions(s) / ED Diagnoses   Final diagnoses:  Pain due to dental caries    New Prescriptions New Prescriptions   AMOXICILLIN (AMOXIL) 500 MG CAPSULE    Take 1 capsule (500 mg total) by mouth 2 (two) times daily.   LIDOCAINE (XYLOCAINE) 2 % SOLUTION    Use as directed 15 mLs in the mouth or throat as needed for mouth pain.    I personally performed the services described in this documentation, which was scribed in my presence. The recorded information has been reviewed and is accurate.    Lorayne Bender, PA-C 04/06/16 [place], MD 04/07/16 [phone]

## 2016-04-06 NOTE — Discharge Instructions (Signed)
You have been seen today for dental pain. Follow-up with the dentist as soon as possible. Please take all of your antibiotics until finished!   You may develop abdominal discomfort or diarrhea from the antibiotic.  You may help offset this with probiotics which you can buy or get in yogurt. Do not eat or take the probiotics until 2 hours after your antibiotic. Swish with the viscous lidocaine as needed for oral pain. Spit the lidocaine out and do not swallow it.   [place], [address] [place], [place] [address] [phone]  [place], [place] [address] [phone]  [place] [address] [place], [place] [address] [phone] ext. [place] [address], [address] [place], [place] [address] [phone]  [facility] [address] [place], [place] [address] [phone]  [facility] [employer] [email or web address]  [place] [address] [place], [place] [address] [phone]  Website for free, low-income, or sliding scale dental services in [place]: www.freedental.us  To find a dentist in [place] and surrounding areas: CardCollectible.com.ee  [facility] MusicClient.gl  [facility] Medicaid Dentist [email or web address]

## 2016-04-06 NOTE — ED Triage Notes (Addendum)
Patient c/o lower dental pain from front to posterior on right side over week. Patient thinks he has an abscess and broken tooth.  Patient states that he has bad teeth. Patient states that he swished his mouth with his girlfriend's Brandi but didn't help and made sure to stress "I didn't swallow it".

## 2016-04-24 ENCOUNTER — Other Ambulatory Visit (HOSPITAL_BASED_OUTPATIENT_CLINIC_OR_DEPARTMENT_OTHER): Payer: Self-pay | Admitting: Radiation Oncology

## 2016-04-24 DIAGNOSIS — C210 Malignant neoplasm of anus, unspecified: Secondary | ICD-10-CM

## 2016-04-24 MED ORDER — OXYCODONE-ACETAMINOPHEN 10-325 MG PO TABS: 1.0000 | ORAL_TABLET | ORAL | 0 refills | Status: DC | PRN

## 2016-04-25 ENCOUNTER — Telehealth: Payer: Self-pay | Admitting: *Deleted

## 2016-04-25 MED FILL — OXYCODONE-APAP 10-325: 10-325 | 16 days supply | Qty: 100 | Fill #0

## 2016-04-25 NOTE — Telephone Encounter (Signed)
Called pateint, he can pick up his pain medication rx today any time before 5pm, patient gave verbal understanding 8:39 AM

## 2016-05-26 ENCOUNTER — Telehealth: Payer: Self-pay | Admitting: Oncology

## 2016-05-26 NOTE — Telephone Encounter (Signed)
Martin [name] called and requested a refill on Percocet.  He last had it filled on 04/24/16 and said he has a few tablets left.

## 2016-05-28 ENCOUNTER — Telehealth: Payer: Self-pay | Admitting: Oncology

## 2016-05-28 NOTE — Telephone Encounter (Signed)
Martin [name] left a message asking about his pain medication refill.  He requested a return call.

## 2016-05-29 ENCOUNTER — Other Ambulatory Visit: Payer: Self-pay | Admitting: Radiation Oncology

## 2016-05-29 MED ORDER — OXYCODONE-ACETAMINOPHEN 10-325 MG PO TABS: 1.0000 | ORAL_TABLET | ORAL | 0 refills | Status: DC | PRN

## 2016-05-29 MED FILL — OXYCODONE-APAP 10-325: 10-325 | 16 days supply | Qty: 100 | Fill #0

## 2016-06-25 ENCOUNTER — Telehealth: Payer: Self-pay | Admitting: Oncology

## 2016-06-25 NOTE — Telephone Encounter (Signed)
Martin [name] left a message requesting a refill on his pain medication.  He last had it filled on 05/29/16.

## 2016-06-26 ENCOUNTER — Other Ambulatory Visit: Payer: Self-pay | Admitting: Oncology

## 2016-06-26 ENCOUNTER — Telehealth: Payer: Self-pay | Admitting: Oncology

## 2016-06-26 DIAGNOSIS — C210 Malignant neoplasm of anus, unspecified: Secondary | ICD-10-CM

## 2016-06-26 NOTE — Telephone Encounter (Signed)
Called Refujio back and let him know that Dr. Sondra Come will be in the office tomorrow and will refill his medication then.  Nikai verbalized understanding.

## 2016-06-26 NOTE — Telephone Encounter (Signed)
[name] called and requested a refill on his pain medication again.  He will be out of medication on 10/21.  Asked if he would like to be referred to a pain clinic again and he said yes.  Advised him that Dr. Sondra Come will be contacted and we will call him back when the prescription is received.

## 2016-06-27 ENCOUNTER — Other Ambulatory Visit: Payer: Self-pay | Admitting: Radiation Oncology

## 2016-06-27 DIAGNOSIS — C210 Malignant neoplasm of anus, unspecified: Secondary | ICD-10-CM

## 2016-06-27 MED ORDER — OXYCODONE-ACETAMINOPHEN 10-325 MG PO TABS: 1.0000 | ORAL_TABLET | ORAL | 0 refills | Status: DC | PRN

## 2016-06-27 MED FILL — OXYCODONE-APAP 10-325: 10-325 | 16 days supply | Qty: 100 | Fill #0

## 2016-07-24 ENCOUNTER — Ambulatory Visit
Admission: RE | Admit: 2016-07-24 | Discharge: 2016-07-24 | Disposition: A | Discharge status: Home / Self Care | Payer: No Typology Code available for payment source | Source: Ambulatory Visit | Attending: Radiation Oncology | Admitting: Radiation Oncology

## 2016-07-25 ENCOUNTER — Telehealth: Payer: Self-pay | Admitting: Oncology

## 2016-07-25 NOTE — Telephone Encounter (Signed)
Martin [name] called requesting a pain medication refill.  Advised him that he had missed his follow up appointment yesterday.  Rescheduled his appointment for Monday at 3:30 pm.  Martin [name] verbalized understanding and agreement of the appointment.

## 2016-07-28 ENCOUNTER — Ambulatory Visit
Admission: RE | Admit: 2016-07-28 | Discharge: 2016-07-28 | Disposition: A | Discharge status: Home / Self Care | Payer: No Typology Code available for payment source | Source: Ambulatory Visit | Attending: Radiation Oncology | Admitting: Radiation Oncology

## 2016-07-28 ENCOUNTER — Encounter: Payer: Self-pay | Admitting: Radiation Oncology

## 2016-07-28 VITALS — BP 162/96 | HR 78 | Temp 97.9°F | Ht 71.0 in | Wt 143.0 lb

## 2016-07-28 DIAGNOSIS — Z79899 Other long term (current) drug therapy: Secondary | ICD-10-CM | POA: Insufficient documentation

## 2016-07-28 DIAGNOSIS — Z08 Encounter for follow-up examination after completed treatment for malignant neoplasm: Secondary | ICD-10-CM | POA: Insufficient documentation

## 2016-07-28 DIAGNOSIS — G8929 Other chronic pain: Secondary | ICD-10-CM | POA: Insufficient documentation

## 2016-07-28 DIAGNOSIS — C210 Malignant neoplasm of anus, unspecified: Secondary | ICD-10-CM

## 2016-07-28 DIAGNOSIS — Z923 Personal history of irradiation: Secondary | ICD-10-CM | POA: Insufficient documentation

## 2016-07-28 DIAGNOSIS — Z885 Allergy status to narcotic agent status: Secondary | ICD-10-CM | POA: Insufficient documentation

## 2016-07-28 DIAGNOSIS — Z85048 Personal history of other malignant neoplasm of rectum, rectosigmoid junction, and anus: Secondary | ICD-10-CM | POA: Insufficient documentation

## 2016-07-28 DIAGNOSIS — Z79891 Long term (current) use of opiate analgesic: Secondary | ICD-10-CM | POA: Insufficient documentation

## 2016-07-28 DIAGNOSIS — Z886 Allergy status to analgesic agent status: Secondary | ICD-10-CM | POA: Insufficient documentation

## 2016-07-28 MED ORDER — OXYCODONE-ACETAMINOPHEN 10-325 MG PO TABS: 1.0000 | ORAL_TABLET | ORAL | 0 refills | Status: DC | PRN

## 2016-07-28 MED ORDER — SILVER SULFADIAZINE 1 % EX CREA
TOPICAL_CREAM | Freq: Once | CUTANEOUS | Status: AC
  Administered 2016-07-28: 16:00:00 via TOPICAL

## 2016-07-28 MED ORDER — OXYCODONE-ACETAMINOPHEN 10-325 MG PO TABS
1.0000 | ORAL_TABLET | ORAL | 0 refills | Status: DC | PRN
Start: 2016-07-28 — End: 2016-08-26

## 2016-07-28 MED FILL — OXYCODONE-APAP 10-325: 10-325 | 16 days supply | Qty: 100 | Fill #0

## 2016-07-28 NOTE — Progress Notes (Addendum)
Martin [name] here for follow up. He continues to have pain in his groin and lower back.  He is rating it at a 5/10 today.  He is taking 3-4 tablets of percocet per day and has requested a refill.  He said he has a rash in his groin.  He put triple antibiotic ointment on it and made it worse.  He said he had a cream left over from radiation that helped but he is almost out of it.  He does not remember the name of it.  He reports having to strain with bowel movements.  He denies having any rectal bleeding.  He reports having a poor energy level and said he feels like he is getting a cold.  BP (!) 162/96 (BP Location: Right Arm, Patient Position: Sitting)   Pulse 78   Temp 97.9 F (36.6 C) (Oral)   Ht 5' 11"  (1.803 m)   Wt 143 lb (64.9 kg)   SpO2 100%   BMI 19.94 kg/m    Wt Readings from Last 3 Encounters:  07/28/16 143 lb (64.9 kg)  04/06/16 150 lb (68 kg)  01/24/16 147 lb 6.4 oz (66.9 kg)

## 2016-07-28 NOTE — Progress Notes (Signed)
[name]   Radiation Oncology         [phone]) [phone] ________________________________  Name: Martin [name] MRN: [id number]  Date: 07/28/2016  DOB: 1960-11-13  Follow-Up Visit Note  CC: Georges Lynch, MD  Armandina Gemma, MD    ICD-9-CM ICD-10-CM   1. Anal cancer ([facility]) 154.3 C21.0 silver sulfADIAZINE (SILVADENE) 1 % cream    Diagnosis:  Anal cancer  Interval Since Last Radiation: 4 years 6 months 11/19/2011 through 01/08/2012  Narrative:  The patient returns today for routine follow-up.  The patient reports continued pain in his groin and lower back which he rates 5/10 in severity. He is taking 3-4 tablets of percocet daily and requests a refill. He reports he has a rash around his groin. He states he has put triple antibiotic ointment on it and made it worse.  He reports that he then tried applying some of the cream left over from his radiation treatments, which did help, but now he is almost out. The patient reports having to strain with bowel movements, though he denies any rectal bleeding. He reports poor energy levels and feels like he is getting a cold.   ALLERGIES:  is allergic to aspirin; codeine; and morphine and related.  Meds: Current Outpatient Prescriptions  Medication Sig Dispense Refill  . aluminum-magnesium hydroxide-simethicone (MAALOX) [phone] MG/5ML SUSP Take 30 mLs by mouth 4 (four) times daily -  before meals and at bedtime.    . calcium carbonate (TUMS - DOSED IN MG ELEMENTAL CALCIUM) 500 MG chewable tablet Chew 1 tablet by mouth daily.    . ondansetron (ZOFRAN ODT) 4 MG disintegrating tablet Take 1 tablet (4 mg total) by mouth every 8 (eight) hours as needed for nausea. 10 tablet 0  . oxyCODONE-acetaminophen (PERCOCET) 10-325 MG tablet Take 1 tablet by mouth every 4 (four) hours as needed for pain. 100 tablet 0  . Phenyleph-Doxylamine-DM-APAP (NYQUIL SEVERE COLD/FLU) 5-6.25-10-325 MG/15ML LIQD Take 30 mLs by mouth at bedtime as needed (cold symptoms). Reported on  01/24/2016    . silver sulfADIAZINE (SILVADENE) 1 % cream Apply 1 application topically daily.    Marland Kitchen omeprazole (PRILOSEC) 20 MG capsule Take 1 capsule (20 mg total) by mouth daily. (Patient not taking: Reported on 07/28/2016) 30 capsule 1  . sildenafil (VIAGRA) 100 MG tablet Take 0.5-1 tablets (50-100 mg total) by mouth daily as needed for erectile dysfunction. (Patient not taking: Reported on 07/28/2016) 5 tablet 11   No current facility-administered medications for this encounter.     Physical Findings: The patient is in no acute distress. Patient is alert and oriented.  height is 5' 11"  (1.803 m) and weight is 143 lb (64.9 kg). His oral temperature is 97.9 F (36.6 C). His blood pressure is 162/96 (abnormal) and his pulse is 78. His oxygen saturation is 100%.   No palpable subclavicular or axillary adenopathy. Lungs are clear to auscultation. The heart has a regular rhythm and rate. The abdomen is soft and nontender with normal bowel sounds. The inguinal areas are free of adenopathy. The patient is a normal uncircumcised male. In the perineum area the patient has significant telangiectasias. A digital exam is performed. Sphincter tone noted to be normal. There is some mild scar tissue noted along the anterior anal canal but no palpable or visible signs of recurrence within the anal canal or lower rectum or perianal area.  Lab Findings: Lab Results  Component Value Date   WBC 11.0 (H) 07/07/2015   HGB 14.3 07/07/2015   HCT  40.9 07/07/2015   MCV 94.2 07/07/2015   PLT 208 07/07/2015    Radiographic Findings: No results found.  Impression: This is a well appearing 55 year old male, approximately 4 years and 6 months out from his last radiation treatment. There is no evidence of recurrence. Chronic pain requiring narcotics.  Due to insurance issues we are unable to set him up for pain clinic(no insurance)  Plan: Routine follow up in 6 months. I will refill the patient's Percocet  prescription. The patient knows to contact me with any questions or concerns he has in the interim.  -----------------------------------  Blair Promise, PhD, MD  This document serves as a record of services personally performed by Gery Pray, MD. It was created on his behalf by Maryla Morrow, a trained medical scribe. The creation of this record is based on the scribe's personal observations and the provider's statements to them. This document has been checked and approved by the attending provider.

## 2016-08-25 ENCOUNTER — Telehealth: Payer: Self-pay | Admitting: Oncology

## 2016-08-25 NOTE — Telephone Encounter (Signed)
Jenesis left a message saying that he needs a refill on his pain medication.  He last had it filled on 07/28/16.

## 2016-08-26 ENCOUNTER — Telehealth: Payer: Self-pay | Admitting: Oncology

## 2016-08-26 ENCOUNTER — Other Ambulatory Visit: Payer: Self-pay | Admitting: Radiation Oncology

## 2016-08-26 DIAGNOSIS — C210 Malignant neoplasm of anus, unspecified: Secondary | ICD-10-CM

## 2016-08-26 MED ORDER — OXYCODONE-ACETAMINOPHEN 10-325 MG PO TABS: 1.0000 | ORAL_TABLET | ORAL | 0 refills | Status: DC | PRN

## 2016-08-26 NOTE — Telephone Encounter (Signed)
Martin [name] left a message asking for a refill on his pain medication.

## 2016-08-27 MED FILL — OXYCODONE-APAP 10-325: 10-325 | 16 days supply | Qty: 100 | Fill #0

## 2016-08-27 NOTE — Telephone Encounter (Signed)
Called Mr. Koranda and advised him that his prescription is ready for pick up.

## 2016-09-12 ENCOUNTER — Other Ambulatory Visit: Payer: Self-pay | Admitting: Nurse Practitioner

## 2016-09-25 ENCOUNTER — Telehealth: Payer: Self-pay | Admitting: Oncology

## 2016-09-25 ENCOUNTER — Other Ambulatory Visit: Payer: Self-pay | Admitting: Radiation Oncology

## 2016-09-25 DIAGNOSIS — C210 Malignant neoplasm of anus, unspecified: Secondary | ICD-10-CM

## 2016-09-25 MED ORDER — OXYCODONE-ACETAMINOPHEN 10-325 MG PO TABS: 1.0000 | ORAL_TABLET | ORAL | 0 refills | Status: DC | PRN

## 2016-09-25 MED FILL — OXYCODONE-APAP 10-325: 10-325 | 17 days supply | Qty: 100 | Fill #0

## 2016-09-25 NOTE — Telephone Encounter (Signed)
Called Martin [name] and advised him that his prescription is available to pick up in the radiation nursing area.

## 2016-09-25 NOTE — Telephone Encounter (Signed)
Martin [name] called and requested a refill on his pain medication.  He last had it filled on 08/26/16 and said he has enough until Friday.

## 2016-10-24 ENCOUNTER — Other Ambulatory Visit: Payer: Self-pay | Admitting: Oncology

## 2016-10-24 NOTE — Telephone Encounter (Signed)
Martin [name] called and requested a refill for his pain medication (oxycodone/acetaminophen 10-325 mg).  He last had it filled on 09/25/16.

## 2016-10-29 ENCOUNTER — Other Ambulatory Visit: Payer: Self-pay | Admitting: Internal Medicine

## 2016-10-29 ENCOUNTER — Other Ambulatory Visit: Payer: Self-pay | Admitting: Radiation Oncology

## 2016-10-29 DIAGNOSIS — C210 Malignant neoplasm of anus, unspecified: Secondary | ICD-10-CM

## 2016-10-29 MED ORDER — OXYCODONE-ACETAMINOPHEN 10-325 MG PO TABS: 1.0000 | ORAL_TABLET | ORAL | 0 refills | Status: DC | PRN

## 2016-10-29 MED FILL — OXYCODONE-APAP 10-325 TAB: 10-325 | 16 days supply | Qty: 100 | Fill #0

## 2016-10-29 NOTE — Telephone Encounter (Signed)
Called Martin [name] and advised him that the pain medication script is ready for pick up in the Radiation Nursing area.

## 2016-11-07 ENCOUNTER — Telehealth: Payer: Self-pay | Admitting: Family Medicine

## 2016-11-07 NOTE — Telephone Encounter (Signed)
Patient has standing appointments with Radiology department. Prefers to be seen for related issues. No appointment scheduled.

## 2016-11-24 ENCOUNTER — Telehealth: Payer: Self-pay | Admitting: Oncology

## 2016-11-24 NOTE — Telephone Encounter (Signed)
Patient called and requested a refill on his pain medication.  He last had it filled on 10/29/16.

## 2016-11-25 ENCOUNTER — Other Ambulatory Visit: Payer: Self-pay | Admitting: Radiation Oncology

## 2016-11-25 MED ORDER — OXYCODONE-ACETAMINOPHEN 10-325 MG PO TABS: 1.0000 | ORAL_TABLET | ORAL | 0 refills | Status: DC | PRN

## 2016-11-26 MED FILL — OXYCODONE-ACETAMINOPHEN 10-: 10-325 | 17 days supply | Qty: 100 | Fill #0

## 2016-12-24 ENCOUNTER — Telehealth: Payer: Self-pay | Admitting: Oncology

## 2016-12-24 NOTE — Telephone Encounter (Signed)
Patient called and requested a refill on pain medication.  He said he will be out tomorrow evening.  He last had it filled on 11/25/16.

## 2016-12-25 ENCOUNTER — Other Ambulatory Visit: Payer: Self-pay | Admitting: Radiation Oncology

## 2016-12-25 DIAGNOSIS — C210 Malignant neoplasm of anus, unspecified: Secondary | ICD-10-CM

## 2016-12-25 MED ORDER — OXYCODONE-ACETAMINOPHEN 10-325 MG PO TABS: 1.0000 | ORAL_TABLET | ORAL | 0 refills | Status: DC | PRN

## 2016-12-25 MED FILL — OXYCODONE-ACETAMINOPHEN 10-: 10-325 | 16 days supply | Qty: 100 | Fill #0

## 2017-01-15 ENCOUNTER — Ambulatory Visit
Admission: RE | Admit: 2017-01-15 | Discharge: 2017-01-15 | Disposition: A | Discharge status: Home / Self Care | Payer: No Typology Code available for payment source | Source: Ambulatory Visit | Attending: Radiation Oncology | Admitting: Radiation Oncology

## 2017-01-15 ENCOUNTER — Encounter: Payer: Self-pay | Admitting: Radiation Oncology

## 2017-01-15 DIAGNOSIS — G8929 Other chronic pain: Secondary | ICD-10-CM | POA: Insufficient documentation

## 2017-01-15 DIAGNOSIS — C210 Malignant neoplasm of anus, unspecified: Secondary | ICD-10-CM | POA: Insufficient documentation

## 2017-01-15 DIAGNOSIS — R21 Rash and other nonspecific skin eruption: Secondary | ICD-10-CM | POA: Insufficient documentation

## 2017-01-15 DIAGNOSIS — Z79899 Other long term (current) drug therapy: Secondary | ICD-10-CM | POA: Insufficient documentation

## 2017-01-15 DIAGNOSIS — Z885 Allergy status to narcotic agent status: Secondary | ICD-10-CM | POA: Insufficient documentation

## 2017-01-15 DIAGNOSIS — Z888 Allergy status to other drugs, medicaments and biological substances status: Secondary | ICD-10-CM | POA: Insufficient documentation

## 2017-01-15 DIAGNOSIS — Z923 Personal history of irradiation: Secondary | ICD-10-CM | POA: Insufficient documentation

## 2017-01-15 MED ORDER — SILVER SULFADIAZINE 1 % EX CREA
TOPICAL_CREAM | Freq: Two times a day (BID) | CUTANEOUS | Status: DC
  Administered 2017-01-15: 13:00:00 via TOPICAL

## 2017-01-15 MED ORDER — OXYCODONE-ACETAMINOPHEN 10-325 MG PO TABS: 1.0000 | ORAL_TABLET | ORAL | 0 refills | Status: DC | PRN

## 2017-01-15 MED FILL — OXYCODONE-ACETAMINOPHEN 10-: 10-325 | 17 days supply | Qty: 100 | Fill #0

## 2017-01-15 NOTE — Progress Notes (Signed)
Mr. Sabo presents for follow up of radiation completed 01/08/2012 for his anal cancer. He reports continued pain to his lower abdomen, groin area. He is taking percocet every 4 hours for pain relief, and rates his pain a 4/10 today after taking his pain medicine. He also continues with a rash to his perineal area which he is using silvadene cream. He tells me that his wife died within the past month, and his father passed away yesterday. He is obviously upset over this today. He has lost 10 lbs since 07/28/16. He feels like this is due to a decreased appetite related to his recent losses.   BP (!) 163/89   Pulse 90   Temp 98.6 F (37 C)   Ht 5' 11"  (1.803 m)   Wt 133 lb (60.3 kg)   SpO2 100% Comment: room air  BMI 18.55 kg/m    Wt Readings from Last 3 Encounters:  01/15/17 133 lb (60.3 kg)  07/28/16 143 lb (64.9 kg)  04/06/16 150 lb (68 kg)

## 2017-01-15 NOTE — Progress Notes (Signed)
[name]   Radiation Oncology         [phone]) [phone] ________________________________  Name: Martin [name] MRN: [id number]  Date: 01/15/2017  DOB: 03-15-1961  Follow-Up Visit Note  CC: McKeag, Marylynn Pearson, MD  Armandina Gemma, MD    ICD-9-CM ICD-10-CM   1. Anal cancer ([place]) 154.3 C21.0     Diagnosis:  Anal cancer  Interval Since Last Radiation: 5 years 11/19/2011 through 01/08/2012  Narrative:  The patient returns today for routine follow-up. The patient reports continued 4/10 pain to his lower abdomen and groin area. He is taking percocet every 4 hours for relief. He reports a continued rash to his perineal area that he is applying silvadene to. Patient reports his wife died this past month and his father passed away yesterday. The patient has lost 10 pounds that he associates with decreased appetite due to recent losses.   ALLERGIES:  is allergic to aspirin; codeine; and morphine and related.  Meds: Current Outpatient Prescriptions  Medication Sig Dispense Refill  . aluminum-magnesium hydroxide-simethicone (MAALOX) [phone] MG/5ML SUSP Take 30 mLs by mouth 4 (four) times daily -  before meals and at bedtime.    . calcium carbonate (TUMS - DOSED IN MG ELEMENTAL CALCIUM) 500 MG chewable tablet Chew 1 tablet by mouth daily.    Marland Kitchen Fexofenadine HCl (MUCINEX ALLERGY PO) Take by mouth.    . oxyCODONE-acetaminophen (PERCOCET) 10-325 MG tablet Take 1 tablet by mouth every 4 (four) hours as needed for pain. 100 tablet 0  . silver sulfADIAZINE (SILVADENE) 1 % cream Apply 1 application topically daily.    Marland Kitchen omeprazole (PRILOSEC) 20 MG capsule Take 1 capsule (20 mg total) by mouth daily. (Patient not taking: Reported on 07/28/2016) 30 capsule 1  . ondansetron (ZOFRAN ODT) 4 MG disintegrating tablet Take 1 tablet (4 mg total) by mouth every 8 (eight) hours as needed for nausea. (Patient not taking: Reported on 01/15/2017) 10 tablet 0  . Phenyleph-Doxylamine-DM-APAP (NYQUIL SEVERE COLD/FLU) 5-6.25-10-325  MG/15ML LIQD Take 30 mLs by mouth at bedtime as needed (cold symptoms). Reported on 01/24/2016    . sildenafil (VIAGRA) 100 MG tablet Take 0.5-1 tablets (50-100 mg total) by mouth daily as needed for erectile dysfunction. (Patient not taking: Reported on 07/28/2016) 5 tablet 11   No current facility-administered medications for this encounter.     Physical Findings: The patient is in no acute distress. Patient is alert and oriented.  height is 5' 11"  (1.803 m) and weight is 133 lb (60.3 kg). His temperature is 98.6 F (37 C). His blood pressure is 163/89 (abnormal) and his pulse is 90. His oxygen saturation is 100%.   No palpable subclavicular or axillary adenopathy. Lungs are clear to auscultation. The heart has a regular rhythm and rate. The abdomen is soft and nontender with normal bowel sounds. The inguinal areas are free of adenopathy. The patient is a normal uncircumcised male. In the perineum area the patient has significant telangiectasias. A digital exam is performed. Sphincter tone noted to be normal. There is some mild scar tissue noted along the anterior anal canal but no palpable or visible signs of recurrence within the anal canal or lower rectum or perianal area.  Lab Findings: Lab Results  Component Value Date   WBC 11.0 (H) 07/07/2015   HGB 14.3 07/07/2015   HCT 40.9 07/07/2015   MCV 94.2 07/07/2015   PLT 208 07/07/2015    Radiographic Findings: No results found.  Impression: There is no evidence of recurrence. Chronic pain  requiring narcotics.  Due to insurance issues we are unable to set him up for pain clinic(no insurance).  Plan: Routine follow up in 6 months. I will refill the patient's Percocet prescription. The patient knows to contact me with any questions or concerns he has in the interim.  -----------------------------------  Blair Promise, PhD, MD  This document serves as a record of services personally performed by Gery Pray, MD. It was created on his  behalf by Bethann Humble, a trained medical scribe. The creation of this record is based on the scribe's personal observations and the provider's statements to them. This document has been checked and approved by the attending provider.

## 2017-01-19 MED FILL — predniSONE 20 MG TABS: 20 | 5 days supply | Qty: 10 | Fill #0

## 2017-01-19 MED FILL — HYDROCODONE-HOMATROPINE SYR: 5-1.5 | 6 days supply | Qty: 120 | Fill #0

## 2017-01-19 MED FILL — DOXYCYCLINE MONO 100 MG CAP: 100 | 10 days supply | Qty: 20 | Fill #0

## 2017-02-16 ENCOUNTER — Telehealth: Payer: Self-pay | Admitting: Oncology

## 2017-02-16 ENCOUNTER — Other Ambulatory Visit: Payer: Self-pay | Admitting: Radiation Oncology

## 2017-02-16 MED ORDER — OXYCODONE-ACETAMINOPHEN 10-325 MG PO TABS: 1.0000 | ORAL_TABLET | ORAL | 0 refills | Status: DC | PRN

## 2017-02-16 NOTE — Telephone Encounter (Signed)
Patient called for a refill on his pain medication (oxycodone-acetaminophen).  He last had it filled on 01/15/17.

## 2017-02-17 MED FILL — OXYCODONE-ACETAMINOPHEN 10-: 10-325 | 17 days supply | Qty: 100 | Fill #0

## 2017-03-16 ENCOUNTER — Telehealth: Payer: Self-pay | Admitting: Oncology

## 2017-03-16 ENCOUNTER — Other Ambulatory Visit: Payer: Self-pay | Admitting: Radiation Oncology

## 2017-03-16 DIAGNOSIS — C210 Malignant neoplasm of anus, unspecified: Secondary | ICD-10-CM

## 2017-03-16 MED ORDER — OXYCODONE-ACETAMINOPHEN 10-325 MG PO TABS: 1.0000 | ORAL_TABLET | ORAL | 0 refills | Status: DC | PRN

## 2017-03-16 MED FILL — OXYCODONE-ACETAMINOPHEN 10-: 10-325 | 17 days supply | Qty: 100 | Fill #0

## 2017-03-16 NOTE — Telephone Encounter (Signed)
Patient left a message requesting a refill of his pain medicaiton.  He last had it filled on 02/16/17.

## 2017-04-02 ENCOUNTER — Encounter (HOSPITAL_COMMUNITY): Payer: Self-pay | Admitting: Emergency Medicine

## 2017-04-02 ENCOUNTER — Emergency Department (HOSPITAL_COMMUNITY): Payer: No Typology Code available for payment source

## 2017-04-02 ENCOUNTER — Emergency Department (HOSPITAL_COMMUNITY)
Admission: EM | Admit: 2017-04-02 | Discharge: 2017-04-02 | Disposition: A | Discharge status: Home / Self Care | Payer: No Typology Code available for payment source | Attending: Emergency Medicine | Admitting: Emergency Medicine

## 2017-04-02 DIAGNOSIS — T63441A Toxic effect of venom of bees, accidental (unintentional), initial encounter: Secondary | ICD-10-CM | POA: Insufficient documentation

## 2017-04-02 DIAGNOSIS — F17210 Nicotine dependence, cigarettes, uncomplicated: Secondary | ICD-10-CM | POA: Insufficient documentation

## 2017-04-02 DIAGNOSIS — I10 Essential (primary) hypertension: Secondary | ICD-10-CM | POA: Insufficient documentation

## 2017-04-02 DIAGNOSIS — T782XXA Anaphylactic shock, unspecified, initial encounter: Secondary | ICD-10-CM | POA: Insufficient documentation

## 2017-04-02 LAB — CBC WITH DIFFERENTIAL/PLATELET
Basophils Absolute: 0 10*3/uL (ref 0.0–0.1)
Basophils Relative: 0 %
Eosinophils Absolute: 0.2 10*3/uL (ref 0.0–0.7)
Eosinophils Relative: 2 %
HCT: 46.3 % (ref 39.0–52.0)
Hemoglobin: 16.8 g/dL (ref 13.0–17.0)
Lymphocytes Relative: 35 %
Lymphs Abs: 4.4 10*3/uL — ABNORMAL HIGH (ref 0.7–4.0)
MCH: 33.8 pg (ref 26.0–34.0)
MCHC: 36.3 g/dL — ABNORMAL HIGH (ref 30.0–36.0)
MCV: 93.2 fL (ref 78.0–100.0)
Monocytes Absolute: 0.6 10*3/uL (ref 0.1–1.0)
Monocytes Relative: 5 %
Neutro Abs: 7.2 10*3/uL (ref 1.7–7.7)
Neutrophils Relative %: 58 %
Platelets: 216 10*3/uL (ref 150–400)
RBC: 4.97 MIL/uL (ref 4.22–5.81)
RDW: 13.2 % (ref 11.5–15.5)
WBC: 12.4 10*3/uL — ABNORMAL HIGH (ref 4.0–10.5)

## 2017-04-02 LAB — BASIC METABOLIC PANEL
Anion gap: 12 (ref 5–15)
BUN: 12 mg/dL (ref 6–20)
CO2: 23 mmol/L (ref 22–32)
Calcium: 9.7 mg/dL (ref 8.9–10.3)
Chloride: 104 mmol/L (ref 101–111)
Creatinine, Ser: 1.26 mg/dL — ABNORMAL HIGH (ref 0.61–1.24)
GFR calc Af Amer: >60 mL/min (ref >60)
GFR calc non Af Amer: >60 mL/min (ref >60)
Glucose, Bld: 138 mg/dL — ABNORMAL HIGH (ref 65–99)
Potassium: 3.6 mmol/L (ref 3.5–5.1)
Sodium: 139 mmol/L (ref 135–145)

## 2017-04-02 MED ORDER — DIPHENHYDRAMINE HCL 50 MG/ML IJ SOLN
25.0000 mg | Freq: Once | INTRAMUSCULAR | Status: AC
  Administered 2017-04-02: 25 mg via INTRAVENOUS
  Filled 2017-04-02: qty 1

## 2017-04-02 MED ORDER — METHYLPREDNISOLONE SODIUM SUCC 125 MG IJ SOLR
125.0000 mg | Freq: Once | INTRAMUSCULAR | Status: AC
  Administered 2017-04-02: 125 mg via INTRAVENOUS
  Filled 2017-04-02: qty 2

## 2017-04-02 MED ORDER — SODIUM CHLORIDE 0.9 % IV BOLUS (SEPSIS)
1000.0000 mL | Freq: Once | INTRAVENOUS | Status: AC
  Administered 2017-04-02: 1000 mL via INTRAVENOUS

## 2017-04-02 MED ORDER — ONDANSETRON HCL 4 MG/2ML IJ SOLN
4.0000 mg | Freq: Once | INTRAMUSCULAR | Status: AC
  Administered 2017-04-02: 4 mg via INTRAVENOUS
  Filled 2017-04-02: qty 2

## 2017-04-02 MED ORDER — GI COCKTAIL ~~LOC~~
30.0000 mL | Freq: Once | ORAL | Status: AC
  Administered 2017-04-02: 30 mL via ORAL
  Filled 2017-04-02: qty 30

## 2017-04-02 MED ORDER — EPINEPHRINE 0.3 MG/0.3ML IJ SOAJ
0.3000 mg | Freq: Once | INTRAMUSCULAR | Status: AC
  Administered 2017-04-02: 0.3 mg via INTRAMUSCULAR
  Filled 2017-04-02: qty 0.3

## 2017-04-02 MED ORDER — FAMOTIDINE IN NACL 20-0.9 MG/50ML-% IV SOLN
20.0000 mg | Freq: Once | INTRAVENOUS | Status: AC
  Administered 2017-04-02: 20 mg via INTRAVENOUS
  Filled 2017-04-02: qty 50

## 2017-04-02 NOTE — ED Provider Notes (Signed)
[facility] DEPT Provider Note   CSN: [id number] Arrival date & time: 04/02/17  1540     History   Chief Complaint Chief Complaint  Patient presents with  . Insect Bite    HPI Martin [name] is a 56 y.o. male with PMHx anal cancer in remission, chronic pain syndrome, HTN and tobacco abuse who presents today with complaint of acute onset, progressively worsening swelling of the right hand and rash secondary to insect bite which occurred 30 minutes PTA to the ED. Patient states he was outside applying chimney caps when he felt an insect sting him. He swatted at the insect and did not visualize it but noted several bees swarming around him. He states he has been stung by bees in the past, but this is the first time he developed severe, crampy abdominal pain, nausea, multiple episodes of vomiting, and generalized urticarial rash. States his abdominal pain has significantly improved and his nausea has resolved. At the time he endorsed generalized feeling of pins and needles to his entire body. Denies shortness of breath, syncope, facial swelling, or throat tightness. Tetanus is UTD.   The history is provided by the patient.    Past Medical History:  Diagnosis Date  . Allergy 2 2013  . 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 Anxiety   . Arthritis    DDD lumbar, arthritis knees  . DJD (degenerative joint disease) of lumbar spine   . GERD (gastroesophageal reflux disease)   . Hemorrhoid    internal  . Hypertension    EKG 12/12 EPIC   no  PCP- states increased lately but hasnt been diagnosed formally  . Inguinal hernia   . Pneumonia    as child, cough at present with no fever  . Pneumonia    as child, cough at present with no fever   . Radiation 11/19/11-01/08/12   5040 cGy 28 fx Pelvis and inguinal area    Patient Active Problem List   Diagnosis Date Noted  . Diverticulosis 11/29/2014  . Healthcare maintenance 11/06/2014  . Cough 10/05/2014    . Insomnia 09/13/2014  . Leg pain 09/13/2014  . Erectile dysfunction 11/30/2013  . Chronic pain syndrome 11/30/2013  . Depression 11/30/2013  . GERD (gastroesophageal reflux disease)   . Hypertension   . Anxiety   . Arthritis   . DJD (degenerative joint disease) of lumbar spine   . Radiation   . Back pain 08/12/2012  . Night sweats 08/12/2012  . Tobacco abuse counseling 05/26/2012  . 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 B12 nutritional deficiency 03/19/2012  . Folate deficiency 03/19/2012  . Inguinal hernia 11/24/2011  . Cancer ([place]) 10/14/2011  . Internal hemorrhoid 09/11/2011  . Anal cancer ([place]) 10/09/2010    Past Surgical History:  Procedure Laterality Date  . APPENDECTOMY     age 52 or 33  . EXAMINATION UNDER ANESTHESIA  10/14/2011   Procedure: EXAM UNDER ANESTHESIA;  Surgeon: Earnstine Regal, MD;  Location: [facility];  Service: General;  Laterality: N/A;  exam under anethesia, Excision of mass anal canal, 1.5cm  . INGUINAL HERNIA REPAIR  05/11/2012   Procedure: HERNIA REPAIR INGUINAL ADULT;  Surgeon: Earnstine Regal, MD;  Location: [facility];  Service: General;  Laterality: Left;  left inguinal hernia repair with mesh  . surgical pathology   10/14/2011   squamous cell ca of anus  . transanal excision  10/14/2011   Dr.[name]  Home Medications    Prior to Admission medications   Medication Sig Start Date End Date Taking? Authorizing Provider  Aspirin-Salicylamide-Caffeine (BC HEADACHE PO) Take 1 packet by mouth daily as needed (headache).   Yes [provider]  oxyCODONE-acetaminophen (PERCOCET) 10-325 MG tablet Take 1 tablet by mouth every 4 (four) hours as needed for pain. 03/16/17  Yes Gery Pray, MD  omeprazole (PRILOSEC) 20 MG capsule Take 1 capsule (20 mg total) by mouth daily. Patient not taking: Reported on 07/28/2016 07/05/15   Mariel Aloe, MD  ondansetron (ZOFRAN ODT) 4 MG disintegrating tablet Take 1 tablet (4 mg total) by mouth  every 8 (eight) hours as needed for nausea. Patient not taking: Reported on 01/15/2017 01/24/16   Gery Pray, MD  sildenafil (VIAGRA) 100 MG tablet Take 0.5-1 tablets (50-100 mg total) by mouth daily as needed for erectile dysfunction. Patient not taking: Reported on 07/28/2016 10/05/14   Timmothy Euler, MD    Family History Family History  Problem Relation Age of Onset  . Colon cancer Father   . Asthma Father   . Cancer Father        prostate  . Hypertension Father   . Asthma Mother   . Hypertension Mother   . Stomach cancer Neg Hx   . Esophageal cancer Neg Hx     Social History Social History  Substance Use Topics  . Smoking status: Current Every Day Smoker    Packs/day: 0.30    Years: 41.00    Types: Cigarettes  . Smokeless tobacco: Never Used     Comment: Cutting back  . Alcohol use 3.6 oz/week    6 Cans of beer per week     Comment: weekend    6pk weekend, beer      Allergies   Yellow jacket venom [bee venom]; Aspirin; Codeine; and Morphine and related   Review of Systems Review of Systems  HENT: Negative for facial swelling and trouble swallowing.   Respiratory: Negative for shortness of breath.   Cardiovascular: Negative for chest pain.  Gastrointestinal: Positive for abdominal pain, nausea and vomiting.  Skin: Positive for rash and wound (insect bite).  Neurological: Negative for syncope.  All other systems reviewed and are negative.    Physical Exam Updated Vital Signs BP (!) 191/91 (BP Location: Right Arm)   Pulse 74   Temp 97.6 F (36.4 C) (Oral)   Resp 15   Ht 5' 11"  (1.803 m)   Wt 68 kg (150 lb)   SpO2 98%   BMI 20.92 kg/m   Physical Exam  Constitutional: He appears well-developed and well-nourished. No distress.  Chronically ill-appearing  HENT:  Head: Normocephalic and atraumatic.  Right Ear: External ear normal.  No trismus, no drooling, no facial swelling noted. Posterior oropharynx is clear.  Eyes: Pupils are equal, round,  and reactive to light. Conjunctivae are normal. Right eye exhibits no discharge. Left eye exhibits no discharge.  Neck: No JVD present. No tracheal deviation present.  Cardiovascular: Normal rate, regular rhythm, normal heart sounds and intact distal pulses.   Distant heart sounds, 2+ radial and DP/PT pulses bl, negative Homan's bl   Pulmonary/Chest: Effort normal and breath sounds normal.  Abdominal: Soft. Bowel sounds are normal. He exhibits no distension. There is no tenderness.  Musculoskeletal: He exhibits edema and tenderness.  Pinpoint area of erythema to the dorsum of the right third digit near the MCP joint where the patient states he was stung. No stinger noted. Generalized swelling  to the dorsum of the right hand, mildly tender to palpation. Good grip strength. 5/5 strength of wrist and digits with flexion and extension against resistance. No deformity or crepitus noted.  Neurological: He is alert.  Fluent speech, no facial droop, sensation intact to soft touch of extremities, normal gait, cranial nerves III-XII tested and intact  Skin: Skin is warm and dry. Rash noted. No erythema.  Generalized urticarial rash noted to the abdomen, back, and extremities  Psychiatric: He has a normal mood and affect. His behavior is normal.  Nursing note and vitals reviewed.    ED Treatments / Results  Labs (all labs ordered are listed, but only abnormal results are displayed) Labs Reviewed  CBC WITH DIFFERENTIAL/PLATELET - Abnormal; Notable for the following:       Result Value   WBC 12.4 (*)    MCHC 36.3 (*)    Lymphs Abs 4.4 (*)    All other components within normal limits  BASIC METABOLIC PANEL - Abnormal; Notable for the following:    Glucose, Bld 138 (*)    Creatinine, Ser 1.26 (*)    All other components within normal limits    EKG  EKG Interpretation None       Radiology Dg Hand Complete Right  Result Date: 04/02/2017 CLINICAL DATA:  Recent being stain several hours ago,  localized swelling, initial encounter EXAM: RIGHT HAND - COMPLETE 3+ VIEW COMPARISON:  None. FINDINGS: Mild soft tissue swelling is noted consistent with the recent injury. No bony abnormality is seen. No definitive radiopaque foreign body is noted. IMPRESSION: Soft tissue swelling without acute bony abnormality. Electronically Signed   By: Inez Catalina M.D.   On: 04/02/2017 16:11    Procedures Procedures (including critical care time)  Medications Ordered in ED Medications  diphenhydrAMINE (BENADRYL) injection 25 mg (25 mg Intravenous Given 04/02/17 1607)  EPINEPHrine (EPI-PEN) injection 0.3 mg (0.3 mg Intramuscular Given 04/02/17 1628)  sodium chloride 0.9 % bolus 1,000 mL (0 mLs Intravenous Stopped 04/02/17 1700)  famotidine (PEPCID) IVPB 20 mg premix (0 mg Intravenous Stopped 04/02/17 1654)  methylPREDNISolone sodium succinate (SOLU-MEDROL) 125 mg/2 mL injection 125 mg (125 mg Intravenous Given 04/02/17 1620)  ondansetron (ZOFRAN) injection 4 mg (4 mg Intravenous Given 04/02/17 2003)  diphenhydrAMINE (BENADRYL) injection 25 mg (25 mg Intravenous Given 04/02/17 2003)  gi cocktail (Maalox,Lidocaine,Donnatal) (30 mLs Oral Given 04/02/17 2003)     Initial Impression / Assessment and Plan / ED Course  I have reviewed the triage vital signs and the nursing notes.  Pertinent labs & imaging results that were available during my care of the patient were reviewed by me and considered in my medical decision making (see chart for details).     Patient presents with urticarial rash and abdominal pain with nausea and vomiting secondary to insect bite, likely from the hymenoptera family. Afebrile, vital signs are at patient's baseline. Airway is patent and SPO2 95% on room air. Given the involvement of 2 body systems, patient presenting with anaphylaxis. IM epinephrine administered as well as Benadryl, Pepcid, and Solu-Medrol and fluids. On reevaluation, hives have significantly improved if not entirely  resolved, however patient is complaining of persistent nausea and abdominal pain which is primarily localized to the epigastric region. After administration of Zofran and GI cocktail, patient states his symptoms have significantly improved and almost entirely resolved. He was observed for over 6 hours and is stable for discharge home with follow-up with primary care in the next 24-48 hours. No evidence of  overlying cellulitis to the area where the insect bite or sting occurred. No evidence of residual foreign body and x-ray of hand reviewed by me shows soft tissue swelling but no acute bony abnormality. His tetanus is up-to-date. Discussed indications for return to the ED. Patient has no questions or concerns prior to discharge. Pt verbalized understanding of and agreement with plan and is safe for discharge home at this time. Critical care was provided.  CRITICAL CARE Performed by: Renita Papa   Total critical care time: >30 minutes  Critical care time was exclusive of separately billable procedures and treating other patients.  Critical care was necessary to treat or prevent imminent or life-threatening deterioration.  Critical care was time spent personally by me on the following activities: development of treatment plan with patient and/or surrogate as well as nursing, discussions with consultants, evaluation of patient's response to treatment, examination of patient, obtaining history from patient or surrogate, ordering and performing treatments and interventions, ordering and review of laboratory studies, ordering and review of radiographic studies, pulse oximetry and re-evaluation of patient's condition.  Final Clinical Impressions(s) / ED Diagnoses   Final diagnoses:  Anaphylaxis, initial encounter  Bee sting, accidental or unintentional, initial encounter    New Prescriptions Discharge Medication List as of 04/02/2017  8:57 PM       Renita Papa, PA-C 04/03/17 1259    Malvin Johns, MD 04/03/17 1623

## 2017-04-02 NOTE — Discharge Instructions (Signed)
Follow-up with your primary care physician for reevaluation. Apply ice to the affected hand to reduce swelling and for comfort.Alternate 600 mg of ibuprofen and [phone] mg of Tylenol every 3 hours as needed for pain. Do not exceed 4000 mg of Tylenol daily. Return to the ED immediately if any concerning signs or symptoms develop.

## 2017-04-02 NOTE — ED Triage Notes (Signed)
Pt stung by yellow jacket to right hand 30 minutes PTA; denies hx of same. Generalized hives noted; no facial swelling or SOB.

## 2017-04-03 ENCOUNTER — Encounter (HOSPITAL_COMMUNITY): Payer: Self-pay | Admitting: Emergency Medicine

## 2017-04-03 ENCOUNTER — Emergency Department (HOSPITAL_COMMUNITY)
Admission: EM | Admit: 2017-04-03 | Discharge: 2017-04-03 | Disposition: A | Discharge status: Home / Self Care | Payer: No Typology Code available for payment source | Attending: Emergency Medicine | Admitting: Emergency Medicine

## 2017-04-03 DIAGNOSIS — R109 Unspecified abdominal pain: Secondary | ICD-10-CM | POA: Insufficient documentation

## 2017-04-03 DIAGNOSIS — R112 Nausea with vomiting, unspecified: Secondary | ICD-10-CM | POA: Insufficient documentation

## 2017-04-03 DIAGNOSIS — Z5321 Procedure and treatment not carried out due to patient leaving prior to being seen by health care provider: Secondary | ICD-10-CM | POA: Insufficient documentation

## 2017-04-03 DIAGNOSIS — R531 Weakness: Secondary | ICD-10-CM | POA: Insufficient documentation

## 2017-04-03 DIAGNOSIS — R42 Dizziness and giddiness: Secondary | ICD-10-CM | POA: Insufficient documentation

## 2017-04-03 LAB — CBC
HEMATOCRIT: 42.3 % (ref 39.0–52.0)
HEMOGLOBIN: 14.7 g/dL (ref 13.0–17.0)
MCH: 32.6 pg (ref 26.0–34.0)
MCHC: 34.8 g/dL (ref 30.0–36.0)
MCV: 93.8 fL (ref 78.0–100.0)
Platelets: 180 10*3/uL (ref 150–400)
RBC: 4.51 MIL/uL (ref 4.22–5.81)
RDW: 13.1 % (ref 11.5–15.5)
WBC: 20 10*3/uL — AB (ref 4.0–10.5)

## 2017-04-03 LAB — COMPREHENSIVE METABOLIC PANEL
ALT: 17 U/L (ref 17–63)
ANION GAP: 10 (ref 5–15)
AST: 25 U/L (ref 15–41)
Albumin: 4.1 g/dL (ref 3.5–5.0)
Alkaline Phosphatase: 67 U/L (ref 38–126)
BILIRUBIN TOTAL: 0.8 mg/dL (ref 0.3–1.2)
BUN: 9 mg/dL (ref 6–20)
CO2: 23 mmol/L (ref 22–32)
Calcium: 9.7 mg/dL (ref 8.9–10.3)
Chloride: 101 mmol/L (ref 101–111)
Creatinine, Ser: 1.08 mg/dL (ref 0.61–1.24)
Glucose, Bld: 139 mg/dL — ABNORMAL HIGH (ref 65–99)
POTASSIUM: 3.5 mmol/L (ref 3.5–5.1)
Sodium: 134 mmol/L — ABNORMAL LOW (ref 135–145)
TOTAL PROTEIN: 6.9 g/dL (ref 6.5–8.1)

## 2017-04-03 LAB — LIPASE, BLOOD: LIPASE: 39 U/L (ref 11–51)

## 2017-04-03 MED ORDER — IBUPROFEN 400 MG PO TABS
ORAL_TABLET | ORAL | Status: AC
  Administered 2017-04-03: 400 mg
  Filled 2017-04-03: qty 1

## 2017-04-03 MED ORDER — ONDANSETRON 4 MG PO TBDP
ORAL_TABLET | ORAL | Status: AC
  Administered 2017-04-03: 4 mg
  Filled 2017-04-03: qty 1

## 2017-04-03 NOTE — ED Notes (Signed)
Pt has been sitting in small triage waiting area.  Visitor arrived and pt told her he was leaving.  Visitor came to nurse first and states pt has already walked outside and she tried to get him to stay and he wouldn't.

## 2017-04-03 NOTE — ED Triage Notes (Signed)
Pt to ER for recurrent nausea and vomiting with "severe" abdominal pain since allergic reaction to yellow jacket sting yesterday, states was seen at [facility]. Pt states dizziness and weakness associated as well.

## 2017-04-05 DIAGNOSIS — T63441A Toxic effect of venom of bees, accidental (unintentional), initial encounter: Secondary | ICD-10-CM | POA: Insufficient documentation

## 2017-04-11 DIAGNOSIS — K5900 Constipation, unspecified: Secondary | ICD-10-CM | POA: Insufficient documentation

## 2017-04-16 ENCOUNTER — Telehealth: Payer: Self-pay | Admitting: Oncology

## 2017-04-16 ENCOUNTER — Other Ambulatory Visit: Payer: Self-pay | Admitting: Radiation Oncology

## 2017-04-16 DIAGNOSIS — C210 Malignant neoplasm of anus, unspecified: Secondary | ICD-10-CM

## 2017-04-16 MED ORDER — OXYCODONE-ACETAMINOPHEN 10-325 MG PO TABS: 1.0000 | ORAL_TABLET | ORAL | 0 refills | Status: DC | PRN

## 2017-04-16 MED FILL — OXYCODONE-APAP 10-325 MG TA: 10-325 | 16 days supply | Qty: 100 | Fill #0

## 2017-04-16 NOTE — Telephone Encounter (Signed)
Patient called and requested a refill on his pain medication.  He last had it filled on 03/16/17.

## 2017-04-19 DIAGNOSIS — Z9049 Acquired absence of other specified parts of digestive tract: Secondary | ICD-10-CM

## 2017-04-19 DIAGNOSIS — K520 Gastroenteritis and colitis due to radiation: Secondary | ICD-10-CM | POA: Insufficient documentation

## 2017-04-19 HISTORY — PX: SMALL INTESTINE SURGERY: SHX150

## 2017-04-19 HISTORY — DX: Acquired absence of other specified parts of digestive tract: Z90.49

## 2017-05-14 ENCOUNTER — Other Ambulatory Visit: Payer: Self-pay | Admitting: Radiation Oncology

## 2017-05-14 ENCOUNTER — Telehealth: Payer: Self-pay | Admitting: Oncology

## 2017-05-14 DIAGNOSIS — C210 Malignant neoplasm of anus, unspecified: Secondary | ICD-10-CM

## 2017-05-14 MED ORDER — OXYCODONE-ACETAMINOPHEN 10-325 MG PO TABS: 1.0000 | ORAL_TABLET | ORAL | 0 refills | Status: DC | PRN

## 2017-05-14 NOTE — Telephone Encounter (Signed)
Patient left a message asking for a refill on pain medication.  He said he has enough to last until Monday.  It was last filled on 04/16/17.

## 2017-05-15 MED FILL — OXYCODONE-APAP 10-325 MG TA: 10-325 | 17 days supply | Qty: 100 | Fill #0

## 2017-05-18 DIAGNOSIS — F411 Generalized anxiety disorder: Secondary | ICD-10-CM | POA: Insufficient documentation

## 2017-06-15 ENCOUNTER — Telehealth: Payer: Self-pay | Admitting: Oncology

## 2017-06-15 NOTE — Telephone Encounter (Signed)
Patient left a message requesting a refill on pain medication (oxyCODONE-acetaminophen (PERCOCET) 10-325 MG tablet) which he last had filled on 05/14/17.

## 2017-06-16 ENCOUNTER — Other Ambulatory Visit: Payer: Self-pay | Admitting: Radiation Oncology

## 2017-06-16 DIAGNOSIS — C210 Malignant neoplasm of anus, unspecified: Secondary | ICD-10-CM

## 2017-06-16 MED ORDER — OXYCODONE-ACETAMINOPHEN 10-325 MG PO TABS: 1.0000 | ORAL_TABLET | ORAL | 0 refills | Status: DC | PRN

## 2017-06-16 MED FILL — OXYCODONE-APAP 10-325 MG TA: 10-325 | 17 days supply | Qty: 100 | Fill #0

## 2017-07-14 ENCOUNTER — Other Ambulatory Visit: Payer: Self-pay | Admitting: Radiation Oncology

## 2017-07-14 ENCOUNTER — Telehealth: Payer: Self-pay | Admitting: Oncology

## 2017-07-14 MED ORDER — OXYCODONE-ACETAMINOPHEN 10-325 MG PO TABS: 1.0000 | ORAL_TABLET | ORAL | 0 refills | Status: DC | PRN

## 2017-07-14 MED FILL — OXYCODONE-APAP 10-325 MG TA: 10-325 | 16 days supply | Qty: 100 | Fill #0

## 2017-07-14 NOTE — Telephone Encounter (Signed)
Patient called and requested a refill on pain medication.  He last had it filled on 06/16/17.

## 2017-07-23 ENCOUNTER — Ambulatory Visit: Payer: No Typology Code available for payment source | Admitting: Radiation Oncology

## 2017-08-03 ENCOUNTER — Other Ambulatory Visit: Payer: Self-pay

## 2017-08-03 ENCOUNTER — Encounter: Payer: Self-pay | Admitting: Radiation Oncology

## 2017-08-03 ENCOUNTER — Ambulatory Visit
Admission: RE | Admit: 2017-08-03 | Discharge: 2017-08-03 | Disposition: A | Discharge status: Home / Self Care | Payer: No Typology Code available for payment source | Source: Ambulatory Visit | Attending: Radiation Oncology | Admitting: Radiation Oncology

## 2017-08-03 VITALS — BP 142/75 | HR 67 | Temp 97.9°F | Resp 20 | Wt 144.0 lb

## 2017-08-03 DIAGNOSIS — Z85048 Personal history of other malignant neoplasm of rectum, rectosigmoid junction, and anus: Secondary | ICD-10-CM | POA: Insufficient documentation

## 2017-08-03 DIAGNOSIS — G8929 Other chronic pain: Secondary | ICD-10-CM | POA: Insufficient documentation

## 2017-08-03 DIAGNOSIS — Z885 Allergy status to narcotic agent status: Secondary | ICD-10-CM | POA: Insufficient documentation

## 2017-08-03 DIAGNOSIS — C210 Malignant neoplasm of anus, unspecified: Secondary | ICD-10-CM

## 2017-08-03 DIAGNOSIS — Z7982 Long term (current) use of aspirin: Secondary | ICD-10-CM | POA: Insufficient documentation

## 2017-08-03 DIAGNOSIS — Z79899 Other long term (current) drug therapy: Secondary | ICD-10-CM | POA: Insufficient documentation

## 2017-08-03 DIAGNOSIS — Z08 Encounter for follow-up examination after completed treatment for malignant neoplasm: Secondary | ICD-10-CM | POA: Insufficient documentation

## 2017-08-03 HISTORY — DX: Acquired absence of other specified parts of digestive tract: Z90.49

## 2017-08-03 MED ORDER — OXYCODONE-ACETAMINOPHEN 5-325 MG PO TABS: 1.0000 | ORAL_TABLET | ORAL | 0 refills | Status: DC | PRN

## 2017-08-03 NOTE — Progress Notes (Signed)
Martin [name]   Radiation Oncology         [phone]) [phone] ________________________________  Name: Martin [name] MRN: [id number]  Date: 08/03/2017  DOB: 06-26-1961  Follow-Up Visit Note  CC: Martin Levels, MD  Martin Gemma, MD    ICD-10-CM   1. Anal cancer ([place]) C21.0     Diagnosis:  Anal cancer  Interval Since Last Radiation: 5 years, 6 months  11/19/2011 through 01/08/2012  Narrative:  The patient returns today for routine follow-up. He reports that in the interim he had an ex lap performed with bowel resection to correct an SBO. 19 cm x 2.5 cm of adhered loop of bowel was removed from the bladder and surgical pathology from this did not show evidence of malignancy. He was admitted for three weeks during this time. He has had some difficulty with healing to his surgical site, but this has been improving. He continues to take the same Percocet 86m which has been adequately controlling his pain. He does note some intermittent rash to the groin area which is pruritic, he notes that it will heal and return periodically. We have previously given him Silvadene which will improve this rash. He notes occasional abdominal cramping. He also reports some right leg cramping at night which has been uncontrolled with percocet. He denies rectal bleeding, pain with bowel movements, diarrhea, or any other associated symptoms.    ALLERGIES:  is allergic to yellow jacket venom [bee venom]; aspirin; codeine; and morphine and related.  Meds: Current Outpatient Medications  Medication Sig Dispense Refill  . ondansetron (ZOFRAN ODT) 4 MG disintegrating tablet Take 1 tablet (4 mg total) by mouth every 8 (eight) hours as needed for nausea. 10 tablet 0  . oxyCODONE-acetaminophen (PERCOCET) 10-325 MG tablet Take 1 tablet every 4 (four) hours as needed by mouth for pain. 100 tablet 0  . Aspirin-Salicylamide-Caffeine (BC HEADACHE PO) Take 1 packet by mouth daily as needed (headache).    .Marland Kitchenomeprazole (PRILOSEC) 20 MG  capsule Take 1 capsule (20 mg total) by mouth daily. (Patient not taking: Reported on 07/28/2016) 30 capsule 1  . polyethylene glycol powder (GLYCOLAX/MIRALAX) powder Take by mouth.    . ranitidine (ZANTAC) 150 MG tablet Take by mouth.    . sildenafil (VIAGRA) 100 MG tablet Take 0.5-1 tablets (50-100 mg total) by mouth daily as needed for erectile dysfunction. (Patient not taking: Reported on 07/28/2016) 5 tablet 11   No current facility-administered medications for this encounter.     Physical Findings: The patient is in no acute distress. Patient is alert and oriented.  weight is 144 lb (65.3 kg). His oral temperature is 97.9 F (36.6 C). His blood pressure is 142/75 (abnormal) and his pulse is 67. His respiration is 20 and oxygen saturation is 100%.   No palpable supraclavicular or axillary adenopathy. Lungs are clear to auscultation. The heart has a regular rhythm and rate. The abdomen is soft and nontender with normal bowel sounds. The inguinal areas are free of adenopathy. The patient is a normal uncircumcised male. In the perineum area the patient has significant telangiectasias. A digital exam is performed. Sphincter tone noted to be normal. There is some mild scar tissue noted along the anterior anal canal but no palpable or visible signs of recurrence within the anal canal or lower rectum or perianal area.There is a laparotomy scar extending from the umbilicus inferiorly. No signs of drainage or infection. I can tell that the patient's scar had initially opened up and has healed  by secondary intent.   Lab Findings: Lab Results  Component Value Date   WBC 20.0 (H) 04/03/2017   HGB 14.7 04/03/2017   HCT 42.3 04/03/2017   MCV 93.8 04/03/2017   PLT 180 04/03/2017      Impression: There is no evidence of recurrence. Chronic pain requiring narcotics. Due to insurance issues we are unable to set him up for pain clinic(no insurance). He does express the desire to begin weaning off of his  narcotics and I will prescribe him 67m Percocet for his next refill.   Plan: Routine follow up in 6 months. I will refill the patient's Percocet prescription at [place] of previous 1076mThe patient knows to contact me with any questions or concerns he has in the interim.  -----------------------------------  Martin PromisehD, MD  This document serves as a record of services personally performed by JamGery [name]. It was created on his behalf by Martin [name] trained medical scribe. The creation of this record is based on the scribe's personal observations and the provider's statements to them. This document has been checked and approved by the attending provider.

## 2017-08-03 NOTE — Progress Notes (Addendum)
Patient is here for a follow-up appointment.Patient denies any pain in the anus area. States that he has mild fatigue. States that his appetite is ok.Patient reports that he loss 30 pound due to a surgery that he had on his intestines in August 2018.Denies any nausea  Or vomiting. States that he has a rash on  The inside of both of his thighs. States that it itches..States that he has medication for it.Denies any diarrhea or constipation.Denies any rectal bleeding. Denies any skin irration in the anus area. Vitals:   08/03/17 1441  BP: (!) 142/75  Pulse: 67  Resp: 20  Temp: 97.9 F (36.6 C)  TempSrc: Oral  SpO2: 100%  Weight: 144 lb (65.3 kg)   Wt Readings from Last 3 Encounters:  08/03/17 144 lb (65.3 kg)  04/02/17 150 lb (68 kg)  01/15/17 133 lb (60.3 kg)

## 2017-08-08 ENCOUNTER — Emergency Department (HOSPITAL_COMMUNITY): Payer: Self-pay

## 2017-08-08 ENCOUNTER — Other Ambulatory Visit: Payer: Self-pay

## 2017-08-08 ENCOUNTER — Encounter (HOSPITAL_COMMUNITY): Payer: Self-pay | Admitting: Emergency Medicine

## 2017-08-08 ENCOUNTER — Emergency Department (HOSPITAL_COMMUNITY)
Admission: EM | Admit: 2017-08-08 | Discharge: 2017-08-08 | Disposition: A | Discharge status: Home / Self Care | Payer: Self-pay | Attending: Emergency Medicine | Admitting: Emergency Medicine

## 2017-08-08 DIAGNOSIS — R079 Chest pain, unspecified: Secondary | ICD-10-CM | POA: Insufficient documentation

## 2017-08-08 DIAGNOSIS — F17210 Nicotine dependence, cigarettes, uncomplicated: Secondary | ICD-10-CM | POA: Insufficient documentation

## 2017-08-08 DIAGNOSIS — Z85048 Personal history of other malignant neoplasm of rectum, rectosigmoid junction, and anus: Secondary | ICD-10-CM | POA: Insufficient documentation

## 2017-08-08 DIAGNOSIS — R112 Nausea with vomiting, unspecified: Secondary | ICD-10-CM

## 2017-08-08 DIAGNOSIS — R270 Ataxia, unspecified: Secondary | ICD-10-CM | POA: Insufficient documentation

## 2017-08-08 DIAGNOSIS — I10 Essential (primary) hypertension: Secondary | ICD-10-CM | POA: Insufficient documentation

## 2017-08-08 DIAGNOSIS — F1410 Cocaine abuse, uncomplicated: Secondary | ICD-10-CM | POA: Insufficient documentation

## 2017-08-08 DIAGNOSIS — R1013 Epigastric pain: Secondary | ICD-10-CM | POA: Insufficient documentation

## 2017-08-08 LAB — CBC WITH DIFFERENTIAL/PLATELET
Basophils Absolute: 0 10*3/uL (ref 0.0–0.1)
Basophils Relative: 0 %
EOS PCT: 0 %
Eosinophils Absolute: 0 10*3/uL (ref 0.0–0.7)
HCT: 43.4 % (ref 39.0–52.0)
Hemoglobin: 14.8 g/dL (ref 13.0–17.0)
LYMPHS ABS: 1.7 10*3/uL (ref 0.7–4.0)
LYMPHS PCT: 10 %
MCH: 32.5 pg (ref 26.0–34.0)
MCHC: 34.1 g/dL (ref 30.0–36.0)
MCV: 95.2 fL (ref 78.0–100.0)
MONO ABS: 0.4 10*3/uL (ref 0.1–1.0)
Monocytes Relative: 3 %
Neutro Abs: 13.8 10*3/uL — ABNORMAL HIGH (ref 1.7–7.7)
Neutrophils Relative %: 87 %
PLATELETS: 224 10*3/uL (ref 150–400)
RBC: 4.56 MIL/uL (ref 4.22–5.81)
RDW: 13.8 % (ref 11.5–15.5)
WBC: 15.9 10*3/uL — ABNORMAL HIGH (ref 4.0–10.5)

## 2017-08-08 LAB — COMPREHENSIVE METABOLIC PANEL
ALK PHOS: 91 U/L (ref 38–126)
ALT: 17 U/L (ref 17–63)
AST: 19 U/L (ref 15–41)
Albumin: 4.2 g/dL (ref 3.5–5.0)
Anion gap: 7 (ref 5–15)
BUN: 10 mg/dL (ref 6–20)
CO2: 26 mmol/L (ref 22–32)
CREATININE: 0.8 mg/dL (ref 0.61–1.24)
Calcium: 9.6 mg/dL (ref 8.9–10.3)
Chloride: 104 mmol/L (ref 101–111)
GLUCOSE: 126 mg/dL — AB (ref 65–99)
Potassium: 4.1 mmol/L (ref 3.5–5.1)
Sodium: 137 mmol/L (ref 135–145)
Total Bilirubin: 0.8 mg/dL (ref 0.3–1.2)
Total Protein: 7.6 g/dL (ref 6.5–8.1)

## 2017-08-08 LAB — LIPASE, BLOOD: Lipase: 23 U/L (ref 11–51)

## 2017-08-08 LAB — I-STAT TROPONIN, ED
TROPONIN I, POC: 0.01 ng/mL (ref 0.00–0.08)
Troponin i, poc: 0.02 ng/mL (ref 0.00–0.08)

## 2017-08-08 MED ORDER — SODIUM CHLORIDE 0.9 % IV BOLUS (SEPSIS)
500.0000 mL | Freq: Once | INTRAVENOUS | Status: AC
  Administered 2017-08-08: 500 mL via INTRAVENOUS

## 2017-08-08 MED ORDER — SODIUM CHLORIDE 0.9 % IV BOLUS (SEPSIS)
1000.0000 mL | Freq: Once | INTRAVENOUS | Status: AC
  Administered 2017-08-08: 1000 mL via INTRAVENOUS

## 2017-08-08 MED ORDER — MECLIZINE HCL 25 MG PO TABS
25.0000 mg | ORAL_TABLET | Freq: Once | ORAL | Status: AC
  Administered 2017-08-08: 25 mg via ORAL
  Filled 2017-08-08: qty 1

## 2017-08-08 MED ORDER — ONDANSETRON HCL 4 MG/2ML IJ SOLN
4.0000 mg | Freq: Once | INTRAMUSCULAR | Status: AC
  Administered 2017-08-08: 4 mg via INTRAVENOUS
  Filled 2017-08-08: qty 2

## 2017-08-08 NOTE — ED Triage Notes (Signed)
Pt brought in from home via EMS   Pt states he smoked 2 small crack rocks and immediately started having hyperemesis  Pt has hx of stomach cancer and was seen by his dr recently and was told it was clear   Pt is not undergoing any treatment at this time  EMS did an EKG, started an IV, received Zofran 54m IV  Pt having dry heaves upon arrival

## 2017-08-08 NOTE — ED Notes (Signed)
Pt ambulates to BR and back to hall bed with steady gait and standby assist only

## 2017-08-08 NOTE — ED Notes (Signed)
Patient transported to MRI 

## 2017-08-08 NOTE — ED Provider Notes (Signed)
Patient awake alert, sitting on the edge of his bed, in no distress, speaking clearly. I reviewed the results with him, he will follow-up with primary care.   Carmin Muskrat, MD 08/08/17 1120

## 2017-08-08 NOTE — Discharge Instructions (Signed)
As discussed, your evaluation today has been largely reassuring.  But, it is important that you monitor your condition carefully, and do not hesitate to return to the ED if you develop new, or concerning changes in your condition. ? ?Otherwise, please follow-up with your physician for appropriate ongoing care. ? ?

## 2017-08-08 NOTE — ED Provider Notes (Signed)
[facility] DEPT Provider Note   CSN: [id number] Arrival date & time: 08/08/17  0213  Time seen 05:30 AM   History   Chief Complaint Chief Complaint  Patient presents with  . Emesis    HPI Martin [name] is a 56 y.o. male.  HPI when I first talked the patient and ask him ice here he states "I do not know".  He then relates he was at a friend's home and he did crack for the first time in 7 years.  He states afterward he could not walk because he had nausea and vomiting and when he tried to stand up or sit up he felt like things were spinning.  He initially told me he did not have headache, abdominal pain, chest pain, shortness of breath or diarrhea however he then states he was having some epigastric or lower central chest discomfort.  He states it is improving but still present.  He states he still has nausea but has not vomited since he has been to the ED.  PCP Eloise Levels, MD   Past Medical History:  Diagnosis Date  . Allergy 2 2013  . 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 Anxiety   . Arthritis    DDD lumbar, arthritis knees  . DJD (degenerative joint disease) of lumbar spine   . GERD (gastroesophageal reflux disease)   . Hemorrhoid    internal  . History of bowel resection   . Hypertension    EKG 12/12 EPIC   no  PCP- states increased lately but hasnt been diagnosed formally  . Inguinal hernia   . Pneumonia    as child, cough at present with no fever  . Pneumonia    as child, cough at present with no fever   . Radiation 11/19/11-01/08/12   5040 cGy 28 fx Pelvis and inguinal area    Patient Active Problem List   Diagnosis Date Noted  . Diverticulosis 11/29/2014  . Healthcare maintenance 11/06/2014  . Cough 10/05/2014  . Insomnia 09/13/2014  . Leg pain 09/13/2014  . Erectile dysfunction 11/30/2013  . Chronic pain syndrome 11/30/2013  . Depression 11/30/2013  . GERD (gastroesophageal reflux  disease)   . Hypertension   . Anxiety   . Arthritis   . DJD (degenerative joint disease) of lumbar spine   . Radiation   . Back pain 08/12/2012  . Night sweats 08/12/2012  . Tobacco abuse counseling 05/26/2012  . 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 B12 nutritional deficiency 03/19/2012  . Folate deficiency 03/19/2012  . Inguinal hernia 11/24/2011  . Cancer ([place]) 10/14/2011  . Internal hemorrhoid 09/11/2011  . Anal cancer ([place]) 10/09/2010    Past Surgical History:  Procedure Laterality Date  . APPENDECTOMY     age 66 or 23  . EXAMINATION UNDER ANESTHESIA  10/14/2011   Procedure: EXAM UNDER ANESTHESIA;  Surgeon: Earnstine Regal, MD;  Location: [facility];  Service: General;  Laterality: N/A;  exam under anethesia, Excision of mass anal canal, 1.5cm  . INGUINAL HERNIA REPAIR  05/11/2012   Procedure: HERNIA REPAIR INGUINAL ADULT;  Surgeon: Earnstine Regal, MD;  Location: [facility];  Service: General;  Laterality: Left;  left inguinal hernia repair with mesh  . surgical pathology   10/14/2011   squamous cell ca of anus  . transanal excision  10/14/2011   Dr.Todd Gerkin       Home Medications  Prior to Admission medications   Medication Sig Start Date End Date Taking? Authorizing Provider  Aspirin-Salicylamide-Caffeine (BC HEADACHE PO) Take 1 packet by mouth daily as needed (headache).   Yes [provider]  oxyCODONE-acetaminophen (PERCOCET/ROXICET) 5-325 MG tablet Take 1 tablet by mouth every 4 (four) hours as needed for severe pain. 08/03/17  Yes Gery Pray, MD  ranitidine (ZANTAC) 150 MG tablet Take 150 mg by mouth 2 (two) times daily as needed for heartburn.    Yes [provider]  oxyCODONE-acetaminophen (PERCOCET) 10-325 MG tablet Take 1 tablet every 4 (four) hours as needed by mouth for pain. Patient not taking: Reported on 08/08/2017 07/14/17   Gery Pray, MD    Family History Family History  Problem Relation Age of Onset  . Colon cancer  Father   . Asthma Father   . Cancer Father        prostate  . Hypertension Father   . Asthma Mother   . Hypertension Mother   . Stomach cancer Neg Hx   . Esophageal cancer Neg Hx     Social History Social History   Tobacco Use  . Smoking status: Current Every Day Smoker    Packs/day: 0.30    Years: 41.00    Pack years: 12.30    Types: Cigarettes  . Smokeless tobacco: Never Used  . Tobacco comment: Cutting back  Substance Use Topics  . Alcohol use: Yes    Alcohol/week: 3.6 oz    Types: 6 Cans of beer per week    Comment: weekend    6pk weekend, beer   . Drug use: Yes    Types: Marijuana, Cocaine    Comment: marijuana in past, occasionally now-last use 3-4 weeks ago     Allergies   Yellow jacket venom [bee venom]; Aspirin; Codeine; and Morphine and related   Review of Systems Review of Systems  All other systems reviewed and are negative.    Physical Exam Updated Vital Signs BP (!) 163/80   Pulse 87   Temp 97.7 F (36.5 C) (Oral)   Resp 18   SpO2 99%   Vital signs normal except hypertension   Physical Exam  Constitutional: He is oriented to person, place, and time.  Non-toxic appearance. He does not appear ill. No distress.  Underweight male, sleeping  HENT:  Head: Normocephalic and atraumatic.  Right Ear: External ear normal.  Left Ear: External ear normal.  Nose: Nose normal. No mucosal edema or rhinorrhea.  Mouth/Throat: Oropharynx is clear and moist and mucous membranes are normal. No dental abscesses or uvula swelling.  Eyes: Conjunctivae and EOM are normal. Pupils are equal, round, and reactive to light.  Neck: Normal range of motion and full passive range of motion without pain. Neck supple.  Cardiovascular: Normal rate, regular rhythm and normal heart sounds. Exam reveals no gallop and no friction rub.  No murmur heard. Pulmonary/Chest: Effort normal and breath sounds normal. No respiratory distress. He has no wheezes. He has no rhonchi. He  has no rales. He exhibits no tenderness and no crepitus.  Abdominal: Soft. Normal appearance and bowel sounds are normal. He exhibits no distension. There is no tenderness. There is no rebound and no guarding.    Area of pain noted.   Musculoskeletal: Normal range of motion. He exhibits no edema or tenderness.  Moves all extremities well.   Neurological: He is alert and oriented to person, place, and time. He has normal strength. No cranial nerve deficit.  Skin: Skin  is warm, dry and intact. No rash noted. No erythema. No pallor.  Psychiatric: He has a normal mood and affect. His speech is normal and behavior is normal. His mood appears not anxious.  Nursing note and vitals reviewed.    ED Treatments / Results  Labs (all labs ordered are listed, but only abnormal results are displayed) Results for orders placed or performed during the hospital encounter of 08/08/17  Comprehensive metabolic panel  Result Value Ref Range   Sodium 137 135 - 145 mmol/L   Potassium 4.1 3.5 - 5.1 mmol/L   Chloride 104 101 - 111 mmol/L   CO2 26 22 - 32 mmol/L   Glucose, Bld 126 (H) 65 - 99 mg/dL   BUN 10 6 - 20 mg/dL   Creatinine, Ser 0.80 0.61 - 1.24 mg/dL   Calcium 9.6 8.9 - 10.3 mg/dL   Total Protein 7.6 6.5 - 8.1 g/dL   Albumin 4.2 3.5 - 5.0 g/dL   AST 19 15 - 41 U/L   ALT 17 17 - 63 U/L   Alkaline Phosphatase 91 38 - 126 U/L   Total Bilirubin 0.8 0.3 - 1.2 mg/dL   GFR calc non Af Amer >60 >60 mL/min   GFR calc Af Amer >60 >60 mL/min   Anion gap 7 5 - 15  Lipase, blood  Result Value Ref Range   Lipase 23 11 - 51 U/L  CBC with Differential  Result Value Ref Range   WBC 15.9 (H) 4.0 - 10.5 K/uL   RBC 4.56 4.22 - 5.81 MIL/uL   Hemoglobin 14.8 13.0 - 17.0 g/dL   HCT 43.4 39.0 - 52.0 %   MCV 95.2 78.0 - 100.0 fL   MCH 32.5 26.0 - 34.0 pg   MCHC 34.1 30.0 - 36.0 g/dL   RDW 13.8 11.5 - 15.5 %   Platelets 224 150 - 400 K/uL   Neutrophils Relative % 87 %   Neutro Abs 13.8 (H) 1.7 - 7.7 K/uL    Lymphocytes Relative 10 %   Lymphs Abs 1.7 0.7 - 4.0 K/uL   Monocytes Relative 3 %   Monocytes Absolute 0.4 0.1 - 1.0 K/uL   Eosinophils Relative 0 %   Eosinophils Absolute 0.0 0.0 - 0.7 K/uL   Basophils Relative 0 %   Basophils Absolute 0.0 0.0 - 0.1 K/uL  I-stat troponin, ED  Result Value Ref Range   Troponin i, poc 0.02 0.00 - 0.08 ng/mL   Comment 3           Laboratory interpretation all normal except leukocytosis    EKG  EKG Interpretation  Date/Time:  Saturday August 08 2017 06:10:47 EST Ventricular Rate:  85 PR Interval:  136 QRS Duration: 100 QT Interval:  388 QTC Calculation: 461 R Axis:   91 Text Interpretation:  Sinus rhythm Rightward axis Incomplete right bundle branch block Poor data quality in limb leads No significant change since last tracing 28 Aug 2011 Confirmed by Rolland Porter [phone]) on 08/08/2017 6:16:56 AM       Radiology Ct Head Wo Contrast  Result Date: 08/08/2017 CLINICAL DATA:  Drug use.  Difficulty walking. EXAM: CT HEAD WITHOUT CONTRAST TECHNIQUE: Contiguous axial images were obtained from the base of the skull through the vertex without intravenous contrast. COMPARISON:  None. FINDINGS: Brain: No acute intracranial abnormality. Specifically, no hemorrhage, hydrocephalus, mass lesion, acute infarction, or significant intracranial injury. Vascular: No hyperdense vessel or unexpected calcification. Skull: No acute calvarial abnormality. Sinuses/Orbits: Visualized paranasal sinuses and  mastoids clear. Orbital soft tissues unremarkable. Other: None IMPRESSION: No acute intracranial abnormality. Electronically Signed   By: Rolm Baptise M.D.   On: 08/08/2017 07:31    Procedures Procedures (including critical care time)  Medications Ordered in ED Medications  sodium chloride 0.9 % bolus 1,000 mL (0 mLs Intravenous Stopped 08/08/17 0709)  sodium chloride 0.9 % bolus 500 mL (0 mLs Intravenous Stopped 08/08/17 0709)  ondansetron (ZOFRAN) injection 4 mg (4  mg Intravenous Given 08/08/17 0552)  meclizine (ANTIVERT) tablet 25 mg (25 mg Oral Given 08/08/17 0732)     Initial Impression / Assessment and Plan / ED Course  I have reviewed the triage vital signs and the nursing notes.  Pertinent labs & imaging results that were available during my care of the patient were reviewed by me and considered in my medical decision making (see chart for details).   EKG in lab work was ordered including troponin.  This troponin is a 3-hour post pain troponin.  Patient was given IV fluids and IV nausea medication.  Recheck at 7 AM patient is having good urinary output.  He states his nausea is better.  However he states he still having difficulty walking, he states when he went to the bathroom he felt like he was going to fall down.  Patient requested the nurse to help him to the bathroom now.  I watched him and when he had a wide-based gait with mild ataxia however the nurse states it was better than he was before.  Patient is waiting to have CT of the head done.  He may require MRI if his gait does not improve  07:42 AM CT does not show anything acute. Will schedule MRI of brain.   Discussed his CT results with patient and need for delta troponin. He states his nausea is gone, only has dizziness on standing.   07:50 AM  AM patient turned over to Dr Vanita Panda for results of his MR  and disposition  Final Clinical Impressions(s) / ED Diagnoses   Final diagnoses:  Cocaine abuse ([place])  Ataxia  Epigastric pain  Chest pain, unspecified type  Nausea and vomiting, intractability of vomiting not specified, unspecified vomiting type    Disposition pending  Rolland Porter, MD, Barbette Or, MD 08/08/17 [phone]

## 2017-08-11 ENCOUNTER — Telehealth: Payer: Self-pay | Admitting: Oncology

## 2017-08-11 MED FILL — OXYCOD/ACETAMINOPHEN 5-325M: 5-325 | 16 days supply | Qty: 100 | Fill #0

## 2017-08-11 NOTE — Telephone Encounter (Signed)
Patient left a message asking for a pain medication refill.  Called him back and let him know that it was sent to [facility] on 08/03/17.  He verbalized understanding.

## 2017-08-26 ENCOUNTER — Telehealth: Payer: Self-pay | Admitting: Oncology

## 2017-08-26 NOTE — Telephone Encounter (Signed)
Patient called and requested a refill on his pain medication - last filled on 08/03/17.

## 2017-08-27 ENCOUNTER — Other Ambulatory Visit: Payer: Self-pay | Admitting: Radiation Oncology

## 2017-08-27 MED ORDER — OXYCODONE-ACETAMINOPHEN 5-325 MG PO TABS: 1.0000 | ORAL_TABLET | ORAL | 0 refills | Status: DC | PRN

## 2017-08-27 MED FILL — OXYCOD/ACETAMINOPHEN 5-325M: 5-325 | 17 days supply | Qty: 100 | Fill #0

## 2017-09-14 ENCOUNTER — Telehealth: Payer: Self-pay | Admitting: Oncology

## 2017-09-14 NOTE — Telephone Encounter (Signed)
Patient called and said he would like a refill on his pain medication.  He said he has 10 tablets left of oxycodone/acetaminophen 5/325 mg.  He last had it filled on 08/27/17 and said he has had to take more tablets per day since he used to take 10/325 mg tablets.  He said he is taking 4-5 a day.

## 2017-09-14 NOTE — Telephone Encounter (Signed)
Patient had questions on how to handle his pain medication requests since his prescription is now electronic.  Advised him that he can keep calling when he needs a refill or he can have the pharmacy fax [place] a request.  He verbalized understanding.

## 2017-09-23 ENCOUNTER — Telehealth: Payer: Self-pay | Admitting: Oncology

## 2017-09-23 NOTE — Telephone Encounter (Signed)
[facility] called and said patient would like a refill of percocet last filled on 08/27/17.

## 2017-09-24 ENCOUNTER — Other Ambulatory Visit: Payer: Self-pay | Admitting: Radiation Oncology

## 2017-09-24 MED ORDER — OXYCODONE-ACETAMINOPHEN 5-325 MG PO TABS: 1.0000 | ORAL_TABLET | ORAL | 0 refills | Status: DC | PRN

## 2017-09-24 MED FILL — OXYCOD/ACETAMINOPHEN 5-325M: 5-325 | 16 days supply | Qty: 100 | Fill #0

## 2017-10-23 ENCOUNTER — Telehealth: Payer: Self-pay | Admitting: Oncology

## 2017-10-23 NOTE — Telephone Encounter (Signed)
Patient called and requested a refill on his pain medication.  He last had it filled on 09/24/17.

## 2017-10-26 ENCOUNTER — Other Ambulatory Visit: Payer: Self-pay | Admitting: Radiation Oncology

## 2017-10-26 MED ORDER — OXYCODONE-ACETAMINOPHEN 5-325 MG PO TABS: 1.0000 | ORAL_TABLET | ORAL | 0 refills | Status: DC | PRN

## 2017-10-26 MED FILL — OXYCOD/ACETAMINOPHEN 5-325M: 5-325 | 17 days supply | Qty: 100 | Fill #0

## 2017-11-05 ENCOUNTER — Ambulatory Visit: Payer: No Typology Code available for payment source | Admitting: Radiation Oncology

## 2017-11-16 ENCOUNTER — Encounter: Payer: Self-pay | Admitting: Radiation Oncology

## 2017-11-16 ENCOUNTER — Other Ambulatory Visit: Payer: Self-pay

## 2017-11-16 ENCOUNTER — Ambulatory Visit
Admission: RE | Admit: 2017-11-16 | Discharge: 2017-11-16 | Disposition: A | Discharge status: Home / Self Care | Payer: No Typology Code available for payment source | Source: Ambulatory Visit | Attending: Radiation Oncology | Admitting: Radiation Oncology

## 2017-11-16 VITALS — BP 153/83 | HR 71 | Temp 99.3°F | Ht 71.0 in | Wt 140.0 lb

## 2017-11-16 DIAGNOSIS — R21 Rash and other nonspecific skin eruption: Secondary | ICD-10-CM | POA: Insufficient documentation

## 2017-11-16 DIAGNOSIS — Z885 Allergy status to narcotic agent status: Secondary | ICD-10-CM | POA: Insufficient documentation

## 2017-11-16 DIAGNOSIS — C210 Malignant neoplasm of anus, unspecified: Secondary | ICD-10-CM

## 2017-11-16 DIAGNOSIS — F17210 Nicotine dependence, cigarettes, uncomplicated: Secondary | ICD-10-CM | POA: Insufficient documentation

## 2017-11-16 DIAGNOSIS — Z85048 Personal history of other malignant neoplasm of rectum, rectosigmoid junction, and anus: Secondary | ICD-10-CM | POA: Insufficient documentation

## 2017-11-16 DIAGNOSIS — Z79899 Other long term (current) drug therapy: Secondary | ICD-10-CM | POA: Insufficient documentation

## 2017-11-16 DIAGNOSIS — Z79891 Long term (current) use of opiate analgesic: Secondary | ICD-10-CM | POA: Insufficient documentation

## 2017-11-16 DIAGNOSIS — Z923 Personal history of irradiation: Secondary | ICD-10-CM | POA: Insufficient documentation

## 2017-11-16 DIAGNOSIS — Z886 Allergy status to analgesic agent status: Secondary | ICD-10-CM | POA: Insufficient documentation

## 2017-11-16 DIAGNOSIS — Z08 Encounter for follow-up examination after completed treatment for malignant neoplasm: Secondary | ICD-10-CM | POA: Insufficient documentation

## 2017-11-16 MED ORDER — SILVER SULFADIAZINE 1 % EX CREA
TOPICAL_CREAM | Freq: Two times a day (BID) | CUTANEOUS | Status: DC
  Administered 2017-11-16: 16:00:00 via TOPICAL

## 2017-11-16 NOTE — Progress Notes (Signed)
Martin [name]   Radiation Oncology         [phone]) [phone] ________________________________  Name: Martin [name] MRN: [id number]  Date: 11/16/2017  DOB: May 12, 1961  Follow-Up Visit Note  CC: Martin More, DO  Martin Gemma, MD    ICD-10-CM   1. Anal cancer ([place]) C21.0     Diagnosis:  Anal cancer  Interval Since Last Radiation: 5 years, 10 months   11/19/2011 through 01/08/2012  Narrative:  The patient returns today for routine follow-up. He reports a rash on both sides of his groin that is itchy. He reports he has run out of Silvadene. He denies pain right now and reports taking 5 mg Percocet every 3-4 hours. He reports making an effort to decrease use of Percocet. He denies rectal bleeding or bowel issues. He reports a poor energy level. Patient reports he continues to smoke 1 ppd. Patient has tried Benadryl cream or hydrocortisone cream as well as other over-the-counter creams which are not as effective as Silvadene for his itching. Patient reports Neosporin causes a rash  ALLERGIES:  is allergic to yellow jacket venom [bee venom]; aspirin; codeine; and morphine and related.  Meds: Current Outpatient Medications  Medication Sig Dispense Refill  . oxyCODONE-acetaminophen (PERCOCET/ROXICET) 5-325 MG tablet Take 1 tablet by mouth every 4 (four) hours as needed for severe pain. 100 tablet 0  . ranitidine (ZANTAC) 150 MG tablet Take 150 mg by mouth 2 (two) times daily as needed for heartburn.      No current facility-administered medications for this encounter.     Physical Findings: The patient is in no acute distress. Patient is alert and oriented.  height is 5' 11"  (1.803 m) and weight is 140 lb (63.5 kg). His oral temperature is 99.3 F (37.4 C). His blood pressure is 153/83 (abnormal) and his pulse is 71. His oxygen saturation is 100%.   No palpable supraclavicular or axillary adenopathy. Lungs are clear to auscultation. The heart has a regular rhythm and rate. The abdomen is soft and  nontender with normal bowel sounds.   The inguinal areas are free of adenopathy. The patient is a normal uncircumcised male. In the perineum area the patient has significant telangiectasias. A digital exam is performed. Sphincter tone noted to be normal. There is some mild scar tissue noted along the anterior anal canal but no palpable or visible signs of recurrence within the anal canal or lower rectum or perianal area. Rectal sphincter tone normal.There is a laparotomy scar extending from the umbilicus inferiorly. No signs of drainage or infection. I can tell that the patient's scar had initially opened up and has healed by secondary intent.   Lab Findings: Lab Results  Component Value Date   WBC 15.9 (H) 08/08/2017   HGB 14.8 08/08/2017   HCT 43.4 08/08/2017   MCV 95.2 08/08/2017   PLT 224 08/08/2017      Impression: There is no evidence of recurrence on clinical exam. The patient has been able to tolerate his lower dose of narcotics q 4 hours of Percocet.   Plan: Routine follow up in 6 months. I will discuss with the thoracic nurse navigator to determine if the patient qualifies for screening chest CT scan since he is a smoker (smokes 1 ppd).   -----------------------------------  Martin Promise, PhD, MD  This document serves as a record of services personally performed by Martin Pray, MD. It was created on his behalf by Martin [name], a trained medical scribe. The creation of this record  is based on the scribe's personal observations and the provider's statements to them. This document has been checked and approved by the attending provider.

## 2017-11-16 NOTE — Progress Notes (Signed)
Martin [name] is here for follow up.  He denies having pain right now and is taking percocet q 4 hours.  He states the only thing bothering him today is a rash on both sides of his groin.  He said it is very itchy and he ran out of the white cream (silvadene).  He denies having any rectal bleeding.  He denies having any bowel issues.  He reports having a poor energy level.  BP (!) 153/83 (BP Location: Right Arm, Patient Position: Sitting)   Pulse 71   Temp 99.3 F (37.4 C) (Oral)   Ht 5' 11"  (1.803 m)   Wt 140 lb (63.5 kg)   SpO2 100%   BMI 19.53 kg/m    Wt Readings from Last 3 Encounters:  11/16/17 140 lb (63.5 kg)  08/03/17 144 lb (65.3 kg)  04/02/17 150 lb (68 kg)

## 2017-11-19 ENCOUNTER — Encounter: Payer: Self-pay | Admitting: *Deleted

## 2017-11-19 DIAGNOSIS — F17200 Nicotine dependence, unspecified, uncomplicated: Secondary | ICD-10-CM

## 2017-11-19 NOTE — Progress Notes (Signed)
Oncology Nurse Navigator Documentation  Oncology Nurse Navigator Flowsheets 11/19/2017  Navigator Location [facility]  Navigator Encounter Type Other/per Dr. Sondra Come, I completed referral to LDCT screening program.  I will update LDCT screening navigator on referral.   Treatment Phase Follow-up  Barriers/Navigation Needs Coordination of Care  Interventions Coordination of Care  Coordination of Care Other  Acuity Level 1  Time Spent with Patient 15

## 2017-11-20 ENCOUNTER — Telehealth: Payer: Self-pay | Admitting: Oncology

## 2017-11-20 ENCOUNTER — Telehealth: Payer: Self-pay | Admitting: Acute Care

## 2017-11-20 DIAGNOSIS — Z122 Encounter for screening for malignant neoplasm of respiratory organs: Secondary | ICD-10-CM

## 2017-11-20 DIAGNOSIS — F17210 Nicotine dependence, cigarettes, uncomplicated: Secondary | ICD-10-CM

## 2017-11-20 NOTE — Telephone Encounter (Signed)
Patient left a message requested a refill on his pain medication. He last had it filled on 10/26/17.

## 2017-11-22 ENCOUNTER — Other Ambulatory Visit: Payer: Self-pay | Admitting: Radiation Oncology

## 2017-11-22 MED ORDER — OXYCODONE-ACETAMINOPHEN 5-325 MG PO TABS: 1.0000 | ORAL_TABLET | ORAL | 0 refills | Status: DC | PRN

## 2017-11-23 MED FILL — OXYCOD/ACETAMINOPHEN 5-325M: 5-325 | 17 days supply | Qty: 100 | Fill #0

## 2017-11-23 NOTE — Telephone Encounter (Signed)
Spoke with pt and scheduled SDMV 12/02/17 10:00 CT ordered Nothing further needed

## 2017-11-23 NOTE — Telephone Encounter (Signed)
LMTC x 1  

## 2017-11-23 NOTE — Telephone Encounter (Signed)
Left a message for patient that his pain medication has been refilled.

## 2017-12-02 ENCOUNTER — Ambulatory Visit (INDEPENDENT_AMBULATORY_CARE_PROVIDER_SITE_OTHER): Payer: No Typology Code available for payment source | Admitting: Acute Care

## 2017-12-02 ENCOUNTER — Ambulatory Visit (INDEPENDENT_AMBULATORY_CARE_PROVIDER_SITE_OTHER)
Admission: RE | Admit: 2017-12-02 | Discharge: 2017-12-02 | Disposition: A | Discharge status: Home / Self Care | Payer: No Typology Code available for payment source | Source: Ambulatory Visit | Attending: Acute Care | Admitting: Acute Care

## 2017-12-02 ENCOUNTER — Encounter: Payer: Self-pay | Admitting: Acute Care

## 2017-12-02 DIAGNOSIS — Z122 Encounter for screening for malignant neoplasm of respiratory organs: Secondary | ICD-10-CM

## 2017-12-02 DIAGNOSIS — F17210 Nicotine dependence, cigarettes, uncomplicated: Secondary | ICD-10-CM

## 2017-12-02 NOTE — Progress Notes (Signed)
Shared Decision Making Visit Lung Cancer Screening Program [phone])   Eligibility:  Age 57 y.o.  Pack Years Smoking History Calculation 58.75 pack year smoking history (# packs/per year x # years smoked)  Recent History of coughing up blood  no  Unexplained weight loss? no ( >Than 15 pounds within the last 6 months )  Prior History Lung / other cancer no (Diagnosis within the last 5 years already requiring surveillance chest CT Scans).  Smoking Status Current Smoker  Former Smokers: Years since quit: NA  Quit Date: NA  Visit Components:  Discussion included one or more decision making aids. yes  Discussion included risk/benefits of screening. yes  Discussion included potential follow up diagnostic testing for abnormal scans. yes  Discussion included meaning and risk of over diagnosis. yes  Discussion included meaning and risk of False Positives. yes  Discussion included meaning of total radiation exposure. yes  Counseling Included:  Importance of adherence to annual lung cancer LDCT screening. yes  Impact of comorbidities on ability to participate in the program. yes  Ability and willingness to under diagnostic treatment. yes  Smoking Cessation Counseling:  Current Smokers:   Discussed importance of smoking cessation. yes  Information about tobacco cessation classes and interventions provided to patient. yes  Patient provided with "ticket" for LDCT Scan. yes  Symptomatic Patient. No  Counseling  Diagnosis Code: Tobacco Use Z72.0  Asymptomatic Patient yes  Counseling (Intermediate counseling: > three minutes counseling) [id number]  Former Smokers:   Discussed the importance of maintaining cigarette abstinence. yes  Diagnosis Code: Personal History of Nicotine Dependence. G25.427  Information about tobacco cessation classes and interventions provided to patient. Yes  Patient provided with "ticket" for LDCT Scan. yes  Written Order for Lung Cancer  Screening with LDCT placed in Epic. Yes (CT Chest Lung Cancer Screening Low Dose W/O CM) [id number] Z12.2-Screening of respiratory organs Z87.891-Personal history of nicotine dependence  I have spent 25 minutes of face to face time with Mr. Slagel discussing the risks and benefits of lung cancer screening. We viewed a power point together that explained in detail the above noted topics. We paused at intervals to allow for questions to be asked and answered to ensure understanding.We discussed that the single most powerful action that he can take to decrease his risk of developing lung cancer is to quit smoking. We discussed whether or not he is ready to commit to setting a quit date. He is not ready to set a quit date, but would like to try the free nicotine patches. We discussed options for tools to aid in quitting smoking including nicotine replacement therapy, non-nicotine medications, support groups, Quit Smart classes, and behavior modification. We discussed that often times setting smaller, more achievable goals, such as eliminating 1 cigarette a day for a week and then 2 cigarettes a day for a week can be helpful in slowly decreasing the number of cigarettes smoked. This allows for a sense of accomplishment as well as providing a clinical benefit. I gave him the " Be Stronger Than Your Excuses" card with contact information for community resources, classes, free nicotine replacement therapy, and access to mobile apps, text messaging, and on-line smoking cessation help. I have also given him my card and contact information in the event he needs to contact me. We discussed the time and location of the scan, and that either Doroteo Glassman RN or I will call with the results within 24-48 hours of receiving them. I have offered him  a  copy of the power point we viewed  as a resource in the event they need reinforcement of the concepts we discussed today in the office. The patient verbalized understanding of all of   the above and had no further questions upon leaving the office. They have my contact information in the event they have any further questions.  I spent 4 minutes counseling on smoking cessation and the health risks of continued tobacco abuse.  I explained to the patient that there has been a high incidence of coronary artery disease noted on these exams. I explained that this is a non-gated exam therefore degree or severity cannot be determined. This patient is not on statin therapy. I have asked the patient to follow-up with their PCP regarding any incidental finding of coronary artery disease and management with diet or medication as their PCP  feels is clinically indicated. The patient verbalized understanding of the above and had no further questions upon completion of the visit.      Magdalen Spatz, NP 12/02/2017 10:26 AM

## 2017-12-09 ENCOUNTER — Other Ambulatory Visit: Payer: Self-pay | Admitting: Acute Care

## 2017-12-09 DIAGNOSIS — F17210 Nicotine dependence, cigarettes, uncomplicated: Principal | ICD-10-CM

## 2017-12-09 DIAGNOSIS — Z122 Encounter for screening for malignant neoplasm of respiratory organs: Secondary | ICD-10-CM

## 2017-12-21 ENCOUNTER — Other Ambulatory Visit: Payer: Self-pay | Admitting: Radiation Oncology

## 2017-12-21 MED ORDER — OXYCODONE-ACETAMINOPHEN 5-325 MG PO TABS: 1.0000 | ORAL_TABLET | ORAL | 0 refills | Status: DC | PRN

## 2017-12-21 MED FILL — OXYCODONE-ACETAMINOPHEN 5-3: 5-325 | 16 days supply | Qty: 100 | Fill #0

## 2018-01-20 ENCOUNTER — Other Ambulatory Visit: Payer: Self-pay | Admitting: Radiation Oncology

## 2018-01-20 MED ORDER — OXYCODONE-ACETAMINOPHEN 5-325 MG PO TABS: 1.0000 | ORAL_TABLET | ORAL | 0 refills | Status: DC | PRN

## 2018-01-20 MED FILL — OXYCODONE-ACETAMINOPHEN 5-3: 5-325 | 16 days supply | Qty: 100 | Fill #0

## 2018-02-15 ENCOUNTER — Encounter (HOSPITAL_COMMUNITY): Payer: Self-pay | Admitting: Emergency Medicine

## 2018-02-15 ENCOUNTER — Other Ambulatory Visit: Payer: Self-pay

## 2018-02-15 ENCOUNTER — Emergency Department (HOSPITAL_COMMUNITY)
Admission: EM | Admit: 2018-02-15 | Discharge: 2018-02-15 | Disposition: A | Discharge status: Home / Self Care | Payer: No Typology Code available for payment source | Attending: Emergency Medicine | Admitting: Emergency Medicine

## 2018-02-15 DIAGNOSIS — Z85048 Personal history of other malignant neoplasm of rectum, rectosigmoid junction, and anus: Secondary | ICD-10-CM | POA: Insufficient documentation

## 2018-02-15 DIAGNOSIS — Z79899 Other long term (current) drug therapy: Secondary | ICD-10-CM | POA: Insufficient documentation

## 2018-02-15 DIAGNOSIS — F17210 Nicotine dependence, cigarettes, uncomplicated: Secondary | ICD-10-CM | POA: Insufficient documentation

## 2018-02-15 DIAGNOSIS — L03211 Cellulitis of face: Secondary | ICD-10-CM | POA: Insufficient documentation

## 2018-02-15 DIAGNOSIS — L299 Pruritus, unspecified: Secondary | ICD-10-CM | POA: Insufficient documentation

## 2018-02-15 DIAGNOSIS — I10 Essential (primary) hypertension: Secondary | ICD-10-CM | POA: Insufficient documentation

## 2018-02-15 LAB — BASIC METABOLIC PANEL
Anion gap: 5 (ref 5–15)
BUN: 8 mg/dL (ref 6–20)
CO2: 28 mmol/L (ref 22–32)
CREATININE: 0.86 mg/dL (ref 0.61–1.24)
Calcium: 9 mg/dL (ref 8.9–10.3)
Chloride: 104 mmol/L (ref 101–111)
GFR calc Af Amer: >60 mL/min (ref >60)
Glucose, Bld: 110 mg/dL — ABNORMAL HIGH (ref 65–99)
Potassium: 4.1 mmol/L (ref 3.5–5.1)
SODIUM: 137 mmol/L (ref 135–145)

## 2018-02-15 LAB — CBC WITH DIFFERENTIAL/PLATELET
Abs Immature Granulocytes: 0 10*3/uL (ref 0.0–0.1)
BASOS ABS: 0.1 10*3/uL (ref 0.0–0.1)
BASOS PCT: 1 %
EOS ABS: 0.3 10*3/uL (ref 0.0–0.7)
EOS PCT: 3 %
HCT: 39.1 % (ref 39.0–52.0)
Hemoglobin: 12.8 g/dL — ABNORMAL LOW (ref 13.0–17.0)
Immature Granulocytes: 0 %
Lymphocytes Relative: 20 %
Lymphs Abs: 1.9 10*3/uL (ref 0.7–4.0)
MCH: 31.3 pg (ref 26.0–34.0)
MCHC: 32.7 g/dL (ref 30.0–36.0)
MCV: 95.6 fL (ref 78.0–100.0)
MONO ABS: 0.6 10*3/uL (ref 0.1–1.0)
Monocytes Relative: 6 %
NEUTROS ABS: 6.9 10*3/uL (ref 1.7–7.7)
Neutrophils Relative %: 70 %
PLATELETS: 154 10*3/uL (ref 150–400)
RBC: 4.09 MIL/uL — ABNORMAL LOW (ref 4.22–5.81)
RDW: 13.6 % (ref 11.5–15.5)
WBC: 9.8 10*3/uL (ref 4.0–10.5)

## 2018-02-15 MED ORDER — DOXYCYCLINE HYCLATE 100 MG PO CAPS: 100.0000 mg | ORAL_CAPSULE | Freq: Two times a day (BID) | ORAL | 0 refills | Status: DC

## 2018-02-15 MED ORDER — SODIUM CHLORIDE 0.9 % IV BOLUS
1000.0000 mL | Freq: Once | INTRAVENOUS | Status: AC
  Administered 2018-02-15: 1000 mL via INTRAVENOUS

## 2018-02-15 MED ORDER — FAMOTIDINE IN NACL 20-0.9 MG/50ML-% IV SOLN
20.0000 mg | Freq: Once | INTRAVENOUS | Status: AC
  Administered 2018-02-15: 20 mg via INTRAVENOUS
  Filled 2018-02-15: qty 50

## 2018-02-15 MED ORDER — DIPHENHYDRAMINE HCL 50 MG/ML IJ SOLN
25.0000 mg | Freq: Once | INTRAMUSCULAR | Status: AC
  Administered 2018-02-15: 25 mg via INTRAVENOUS
  Filled 2018-02-15: qty 1

## 2018-02-15 MED ORDER — PREDNISONE 10 MG PO TABS: 20.0000 mg | ORAL_TABLET | Freq: Every day | ORAL | 0 refills | Status: DC

## 2018-02-15 MED ORDER — METHYLPREDNISOLONE SODIUM SUCC 125 MG IJ SOLR
125.0000 mg | Freq: Once | INTRAMUSCULAR | Status: AC
  Administered 2018-02-15: 125 mg via INTRAVENOUS
  Filled 2018-02-15: qty 2

## 2018-02-15 MED ORDER — TRIAMCINOLONE 0.1 % CREAM:EUCERIN CREAM 1:1
TOPICAL_CREAM | Freq: Once | CUTANEOUS | Status: AC
  Administered 2018-02-15: 14:00:00 via TOPICAL
  Filled 2018-02-15 (×2): qty 1

## 2018-02-15 NOTE — ED Triage Notes (Signed)
Pt. Stated, I was a cancer pt. And had too much radiation and Im having a rash and itching so bad. I have a cream but not working worse for the last month.

## 2018-02-15 NOTE — Discharge Instructions (Addendum)
Apply the ointment sparingly to the affected areas twice a day.  Prescription for antibiotic and prednisone.  Take Benadryl for itching.

## 2018-02-15 NOTE — ED Notes (Signed)
Called pharmacy to check on cream status, they will reach out to compounding dept.

## 2018-02-15 NOTE — ED Notes (Signed)
ED Provider at bedside. 

## 2018-02-15 NOTE — ED Provider Notes (Signed)
[facility] EMERGENCY DEPARTMENT Provider Note   CSN: [id number] Arrival date & time: 02/15/18  8341     History   Chief Complaint Chief Complaint  Patient presents with  . Rash  . Pruritis    HPI Martin [name] is a 57 y.o. male.  Patient complains of long-standing rash and itching to his perineum and face particularly in the upper lip area.  He blames the perineal symptoms on complications from radiation and chemotherapy secondary to anal cancer many years ago.  He does not have a primary care relationship.  Patient has been using Silvadene cream without much relief.  No fever, sweats, chills.  His primary concern is the itching.  Severity is moderate to severe.  Nothing makes symptoms better or worse.     Past Medical History:  Diagnosis Date  . Allergy 2 2013  . 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 Anxiety   . Arthritis    DDD lumbar, arthritis knees  . DJD (degenerative joint disease) of lumbar spine   . GERD (gastroesophageal reflux disease)   . Hemorrhoid    internal  . History of bowel resection   . Hypertension    EKG 12/12 EPIC   no  PCP- states increased lately but hasnt been diagnosed formally  . Inguinal hernia   . Pneumonia    as child, cough at present with no fever  . Pneumonia    as child, cough at present with no fever   . Radiation 11/19/11-01/08/12   5040 cGy 28 fx Pelvis and inguinal area    Patient Active Problem List   Diagnosis Date Noted  . Diverticulosis 11/29/2014  . Healthcare maintenance 11/06/2014  . Cough 10/05/2014  . Insomnia 09/13/2014  . Leg pain 09/13/2014  . Erectile dysfunction 11/30/2013  . Chronic pain syndrome 11/30/2013  . Depression 11/30/2013  . GERD (gastroesophageal reflux disease)   . Hypertension   . Anxiety   . Arthritis   . DJD (degenerative joint disease) of lumbar spine   . Radiation   . Back pain 08/12/2012  . Night sweats 08/12/2012  . Tobacco  abuse counseling 05/26/2012  . 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 B12 nutritional deficiency 03/19/2012  . Folate deficiency 03/19/2012  . Inguinal hernia 11/24/2011  . Cancer ([place]) 10/14/2011  . Internal hemorrhoid 09/11/2011  . Anal cancer ([place]) 10/09/2010    Past Surgical History:  Procedure Laterality Date  . APPENDECTOMY     age 31 or 24  . EXAMINATION UNDER ANESTHESIA  10/14/2011   Procedure: EXAM UNDER ANESTHESIA;  Surgeon: Earnstine Regal, MD;  Location: [facility];  Service: General;  Laterality: N/A;  exam under anethesia, Excision of mass anal canal, 1.5cm  . INGUINAL HERNIA REPAIR  05/11/2012   Procedure: HERNIA REPAIR INGUINAL ADULT;  Surgeon: Earnstine Regal, MD;  Location: [facility];  Service: General;  Laterality: Left;  left inguinal hernia repair with mesh  . surgical pathology   10/14/2011   squamous cell ca of anus  . transanal excision  10/14/2011   Dr.Todd Gerkin        Home Medications    Prior to Admission medications   Medication Sig Start Date End Date Taking? Authorizing Provider  oxyCODONE-acetaminophen (PERCOCET/ROXICET) 5-325 MG tablet Take 1 tablet by mouth every 4 (four) hours as needed for severe pain. 01/20/18  Yes Gery Pray, MD  doxycycline (VIBRAMYCIN) 100 MG capsule Take  1 capsule (100 mg total) by mouth 2 (two) times daily. 02/15/18   Nat Christen, MD  predniSONE (DELTASONE) 10 MG tablet Take 2 tablets (20 mg total) by mouth daily. 02/15/18   Nat Christen, MD  ranitidine (ZANTAC) 150 MG tablet Take 150 mg by mouth 2 (two) times daily as needed for heartburn.     [provider]    Family History Family History  Problem Relation Age of Onset  . Colon cancer Father   . Asthma Father   . Cancer Father        prostate  . Hypertension Father   . Asthma Mother   . Hypertension Mother   . Stomach cancer Neg Hx   . Esophageal cancer Neg Hx     Social History Social History   Tobacco Use  . Smoking status: Current Every Day  Smoker    Packs/day: 1.25    Years: 47.00    Pack years: 58.75    Types: Cigarettes  . Smokeless tobacco: Never Used  . Tobacco comment: Cutting back>> Quit Smart card supplied  Substance Use Topics  . Alcohol use: Yes    Alcohol/week: 3.6 oz    Types: 6 Cans of beer per week    Comment: weekend    6pk weekend, beer   . Drug use: Yes    Types: Marijuana, Cocaine    Comment: marijuana in past, occasionally now-last use 3-4 weeks ago     Allergies   Yellow jacket venom [bee venom]; Aspirin; Codeine; and Morphine and related   Review of Systems Review of Systems  All other systems reviewed and are negative.    Physical Exam Updated Vital Signs BP 118/67   Pulse 62   Temp 97.8 F (36.6 C) (Oral)   Resp 16   Ht 5' 10"  (1.778 m)   Wt 65.8 kg (145 lb)   SpO2 100%   BMI 20.81 kg/m   Physical Exam  Constitutional: He is oriented to person, place, and time. He appears well-developed and well-nourished.  HENT:  Head: Normocephalic and atraumatic.  Eyes: Conjunctivae are normal.  Neck: Neck supple.  Cardiovascular: Normal rate and regular rhythm.  Pulmonary/Chest: Effort normal and breath sounds normal.  Abdominal: Soft. Bowel sounds are normal.  Musculoskeletal: Normal range of motion.  Neurological: He is alert and oriented to person, place, and time.  Skin:  Perineal area and soft tissue between upper lip and nose: Erythematous crusts, plaques, excoriations.  Psychiatric: He has a normal mood and affect. His behavior is normal.  Nursing note and vitals reviewed.    ED Treatments / Results  Labs (all labs ordered are listed, but only abnormal results are displayed) Labs Reviewed  CBC WITH DIFFERENTIAL/PLATELET - Abnormal; Notable for the following components:      Result Value   RBC 4.09 (*)    Hemoglobin 12.8 (*)    All other components within normal limits  BASIC METABOLIC PANEL - Abnormal; Notable for the following components:   Glucose, Bld 110 (*)     All other components within normal limits    EKG None  Radiology No results found.  Procedures Procedures (including critical care time)  Medications Ordered in ED Medications  sodium chloride 0.9 % bolus 1,000 mL (0 mLs Intravenous Stopped 02/15/18 1024)  methylPREDNISolone sodium succinate (SOLU-MEDROL) 125 mg/2 mL injection 125 mg (125 mg Intravenous Given 02/15/18 0922)  famotidine (PEPCID) IVPB 20 mg premix (0 mg Intravenous Stopped 02/15/18 0953)  diphenhydrAMINE (BENADRYL) injection 25  mg (25 mg Intravenous Given 02/15/18 0922)  triamcinolone 0.1 % cream : eucerin cream, 1:1 ( Topical Given 02/15/18 1411)     Initial Impression / Assessment and Plan / ED Course  I have reviewed the triage vital signs and the nursing notes.  Pertinent labs & imaging results that were available during my care of the patient were reviewed by me and considered in my medical decision making (see chart for details).     Patient is a significant dermatitis/cellulitis on his perineum and soft tissue face.  He was given IV Solu-Medrol, Benadryl, Pepcid in the emergency department.  Will discharge home on prednisone and doxycycline 100 mg along with triamcinolone/Eucerin cream.  Benadryl for the itching.  Final Clinical Impressions(s) / ED Diagnoses   Final diagnoses:  Pruritus  Cellulitis of face    ED Discharge Orders        Ordered    doxycycline (VIBRAMYCIN) 100 MG capsule  2 times daily     02/15/18 1416    predniSONE (DELTASONE) 10 MG tablet  Daily     02/15/18 1416       Nat Christen, MD 02/15/18 1437

## 2018-02-16 MED FILL — predniSONE 10 MG TABS: 10 | 7 days supply | Qty: 15 | Fill #0

## 2018-02-16 MED FILL — DOXYCYCLINE HYCLATE 100 MG: 100 | 10 days supply | Qty: 20 | Fill #0

## 2018-02-17 ENCOUNTER — Other Ambulatory Visit: Payer: Self-pay | Admitting: Radiation Oncology

## 2018-02-17 MED ORDER — OXYCODONE-ACETAMINOPHEN 5-325 MG PO TABS: 1.0000 | ORAL_TABLET | ORAL | 0 refills | Status: DC | PRN

## 2018-02-17 MED FILL — OXYCODONE-ACETAMINOPHEN 5-3: 5-325 | 16 days supply | Qty: 100 | Fill #0

## 2018-03-16 ENCOUNTER — Other Ambulatory Visit: Payer: Self-pay | Admitting: Radiation Oncology

## 2018-03-16 MED ORDER — OXYCODONE-ACETAMINOPHEN 5-325 MG PO TABS: 1.0000 | ORAL_TABLET | ORAL | 0 refills | Status: DC | PRN

## 2018-03-17 MED FILL — OXYCODONE-ACETAMINOPHEN 5-3: 5-325 | 17 days supply | Qty: 100 | Fill #0

## 2018-04-15 ENCOUNTER — Other Ambulatory Visit: Payer: Self-pay | Admitting: Radiation Oncology

## 2018-04-15 MED ORDER — OXYCODONE-ACETAMINOPHEN 5-325 MG PO TABS: 1.0000 | ORAL_TABLET | ORAL | 0 refills | Status: DC | PRN

## 2018-04-15 MED FILL — OXYCODONE-ACETAMINOPHEN 5-3: 5-325 | 16 days supply | Qty: 100 | Fill #0

## 2018-04-22 ENCOUNTER — Encounter (HOSPITAL_COMMUNITY): Payer: Self-pay | Admitting: Emergency Medicine

## 2018-04-22 ENCOUNTER — Other Ambulatory Visit: Payer: Self-pay

## 2018-04-22 ENCOUNTER — Ambulatory Visit (HOSPITAL_COMMUNITY)
Admission: EM | Admit: 2018-04-22 | Discharge: 2018-04-22 | Disposition: A | Discharge status: Home / Self Care | Payer: Self-pay | Attending: Family Medicine | Admitting: Family Medicine

## 2018-04-22 ENCOUNTER — Telehealth (HOSPITAL_COMMUNITY): Payer: Self-pay | Admitting: Emergency Medicine

## 2018-04-22 DIAGNOSIS — J441 Chronic obstructive pulmonary disease with (acute) exacerbation: Secondary | ICD-10-CM

## 2018-04-22 DIAGNOSIS — L0100 Impetigo, unspecified: Secondary | ICD-10-CM

## 2018-04-22 MED ORDER — DOXYCYCLINE HYCLATE 100 MG PO CAPS: 100.0000 mg | ORAL_CAPSULE | Freq: Two times a day (BID) | ORAL | 0 refills | Status: DC

## 2018-04-22 MED ORDER — PREDNISONE 10 MG PO TABS: 20.0000 mg | ORAL_TABLET | Freq: Two times a day (BID) | ORAL | 0 refills | Status: DC

## 2018-04-22 MED ORDER — MUPIROCIN CALCIUM 2 % EX CREA: 1.0000 "application " | TOPICAL_CREAM | Freq: Two times a day (BID) | CUTANEOUS | 1 refills | Status: DC

## 2018-04-22 MED FILL — MUPIROCIN 2% OINTMENT: 2 | 10 days supply | Qty: 22 | Fill #0

## 2018-04-22 MED FILL — predniSONE 10 MG TABS: 10 | 20 days supply | Qty: 20 | Fill #0

## 2018-04-22 MED FILL — DOXYCYCLINE HYCLATE 100 MG: 100 | 20 days supply | Qty: 20 | Fill #0

## 2018-04-22 NOTE — ED Triage Notes (Signed)
Rash for 1 1/2 months.  Seen in ED and received treatment. Patient reports improvement, but once treatment stopped, rash returned.  Rash to face nose, in noise and upper lip.    Patient has had cough for a week.  Patient has green yellow phlegm.    Patient has rectal cancer and has completed treatment

## 2018-04-22 NOTE — Telephone Encounter (Signed)
Patient returned to [facility] to get bactroban cream switched to ointment-cheaper per pharmacy. Dr Meda Coffee agreed and pharmacy notified

## 2018-04-22 NOTE — Discharge Instructions (Signed)
Take the antibiotic 2 x a day for 10 days Take the prednisone 2 x a day for 5 days Use the cream under nose until the rash clears The cream has a refill Call [facility] for an appointment in follow up

## 2018-04-22 NOTE — ED Provider Notes (Signed)
[facility]    CSN: [id number] Arrival date & time: 04/22/18  2248     History   Chief Complaint Chief Complaint  Patient presents with  . Rash  . Cough    Martin [name] is a 57 y.o. male.   Martin  Patient is here with 2 complaints.  First, he has had a productive cough for over a week.  He is coughing up green and yellow phlegm.  He feels more tired.  He feels slightly more short of breath.  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  He is not usually on any inhalers.  No fatigue, nausea or vomiting, fever or chills.  He has not had pneumonia.  He does not have insurance, therefore does not have a PCP.  [facility] is [facility].  He is advised to quit smoking, however, states he cannot do due to "stress". Patient also complains of a rash under his nose.  He would like this looked at while he is here.  He was seen in the ER for this a couple months back.  He was treated with doxycycline and oral prednisone with triamcinolone.  He states it "started to clear up" but then came back.  He states that it itches, hurts, and occasionally cracks open and has scant bleeding.  No fever or chills.  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 Medical history is reviewed.  He has a history of anal cancer, this is been treated.  He is on chronic oxycodone for abdominal pain from his anal cancer, given to him by oncology.  He is not on any other medications.  Past Medical History:  Diagnosis Date  . Allergy 2 2013  . 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 Anxiety   . Arthritis    DDD lumbar, arthritis knees  . DJD (degenerative joint disease) of lumbar spine   . GERD (gastroesophageal reflux disease)   . Hemorrhoid    internal  . History of bowel resection   . Hypertension    EKG 12/12 EPIC   no  PCP- states increased lately but hasnt been diagnosed formally  . Inguinal hernia     . Pneumonia    as child, cough at present with no fever  . Pneumonia    as child, cough at present with no fever   . Radiation 11/19/11-01/08/12   5040 cGy 28 fx Pelvis and inguinal area    Patient Active Problem List   Diagnosis Date Noted  . Diverticulosis 11/29/2014  . Healthcare maintenance 11/06/2014  . Cough 10/05/2014  . Insomnia 09/13/2014  . Erectile dysfunction 11/30/2013  . Chronic pain syndrome 11/30/2013  . Depression 11/30/2013  . GERD (gastroesophageal reflux disease)   . Hypertension   . Anxiety   . Arthritis   . DJD (degenerative joint disease) of lumbar spine   . Radiation   . Night sweats 08/12/2012  . Tobacco abuse counseling 05/26/2012  . 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 B12 nutritional deficiency 03/19/2012  . Folate deficiency 03/19/2012  . Inguinal hernia 11/24/2011  . Cancer ([place]) 10/14/2011  . Internal hemorrhoid 09/11/2011  . Anal cancer ([place]) 10/09/2010    Past Surgical History:  Procedure Laterality Date  . APPENDECTOMY     age 41 or 16  . EXAMINATION UNDER ANESTHESIA  10/14/2011   Procedure: EXAM UNDER ANESTHESIA;  Surgeon: Earnstine Regal, MD;  Location: [facility];  Service: General;  Laterality: N/A;  exam under anethesia, Excision of mass anal canal, 1.5cm  . INGUINAL HERNIA REPAIR  05/11/2012   Procedure: HERNIA REPAIR INGUINAL ADULT;  Surgeon: Earnstine Regal, MD;  Location: [facility];  Service: General;  Laterality: Left;  left inguinal hernia repair with mesh  . surgical pathology   10/14/2011   squamous cell ca of anus  . transanal excision  10/14/2011   Dr.Todd Gerkin       Home Medications    Prior to Admission medications   Medication Sig Start Date End Date Taking? Authorizing Provider  doxycycline (VIBRAMYCIN) 100 MG capsule Take 1 capsule (100 mg total) by mouth 2 (two) times daily. 04/22/18   Raylene Everts, MD  mupirocin cream (BACTROBAN) 2 % Apply 1 application topically 2 (two) times daily. 04/22/18   Raylene Everts, MD  oxyCODONE-acetaminophen (PERCOCET/ROXICET) 5-325 MG tablet Take 1 tablet by mouth every 4 (four) hours as needed for severe pain. 04/15/18   Gery Pray, MD  predniSONE (DELTASONE) 10 MG tablet Take 2 tablets (20 mg total) by mouth 2 (two) times daily with a meal. 04/22/18   Raylene Everts, MD    Family History Family History  Problem Relation Age of Onset  . Colon cancer Father   . Asthma Father   . Cancer Father        prostate  . Hypertension Father   . Asthma Mother   . Hypertension Mother   . Stomach cancer Neg Hx   . Esophageal cancer Neg Hx     Social History Social History   Tobacco Use  . Smoking status: Current Every Day Smoker    Packs/day: 1.25    Years: 47.00    Pack years: 58.75    Types: Cigarettes  . Smokeless tobacco: Never Used  . Tobacco comment: Cutting back>> Quit Smart card supplied  Substance Use Topics  . Alcohol use: Yes    Alcohol/week: 6.0 standard drinks    Types: 6 Cans of beer per week    Comment: weekend    6pk weekend, beer   . Drug use: Yes    Types: Marijuana, Cocaine    Comment: marijuana in past, occasionally now-last use 3-4 weeks ago     Allergies   Yellow jacket venom [bee venom]; Aspirin; Codeine; and Morphine and related   Review of Systems Review of Systems  Constitutional: Negative for chills and fever.  HENT: Positive for dental problem. Negative for congestion, ear pain and sore throat.   Eyes: Negative for pain and visual disturbance.  Respiratory: Positive for cough and shortness of breath.   Cardiovascular: Negative for chest pain and palpitations.  Gastrointestinal: Negative for abdominal pain, nausea and vomiting.  Genitourinary: Negative for dysuria and hematuria.  Musculoskeletal: Negative for arthralgias and back pain.  Skin: Positive for rash. Negative for color change.  Neurological: Negative for seizures and syncope.  Psychiatric/Behavioral: Negative for dysphoric mood. The patient is  not nervous/anxious.   All other systems reviewed and are negative.    Physical Exam Triage Vital Signs ED Triage Vitals  Enc Vitals Group     BP 04/22/18 0843 137/71     Pulse Rate 04/22/18 0843 64     Resp 04/22/18 0843 18     Temp 04/22/18 0843 (!) 97.1 F (36.2 C)     Temp Source 04/22/18 0843 Oral     SpO2 04/22/18  0843 97 %     Weight --      Height --      Head Circumference --      Peak Flow --      Pain Score 04/22/18 0841 5     Pain Loc --      Pain Edu? --      Excl. in [place]? --    No data found.  Updated Vital Signs BP 137/71 (BP Location: Left Arm)   Pulse 64   Temp (!) 97.1 F (36.2 C) (Oral)   Resp 18   SpO2 97%   Physical Exam  Constitutional: He appears well-developed and well-nourished. No distress.  Thin.  No acute distress.  HENT:  Head: Normocephalic and atraumatic.  Right Ear: External ear normal.  Left Ear: External ear normal.  Mouth/Throat: Oropharynx is clear and moist.  Poor dentition, poor oral hygiene problem.  Fractures.  Eyes: Pupils are equal, round, and reactive to light. Conjunctivae are normal.  Neck: Normal range of motion.  Cardiovascular: Normal rate, regular rhythm and normal heart sounds.  Pulmonary/Chest: Effort normal and breath sounds normal. No respiratory distress. He has no wheezes.  Few anterior rhonchi  Abdominal: Soft. He exhibits no distension.  Musculoskeletal: Normal range of motion. He exhibits no edema.  Lymphadenopathy:    He has no cervical adenopathy.  Neurological: He is alert.  Skin: Skin is warm and dry. Rash noted.  Around both nostrils, on the upper lip, and inside the nostrils there is a crusted, scaly, erythematous rash with some fissuring of the skin.  Psychiatric: He has a normal mood and affect.     UC Treatments / Results  Labs (all labs ordered are listed, but only abnormal results are displayed) Labs Reviewed - No data to display  EKG None  Radiology No results  found.  Procedures Procedures (including critical care time)  Medications Ordered in UC Medications - No data to display  Initial Impression / Assessment and Plan / UC Course  I have reviewed the triage vital signs and the nursing notes.  Pertinent labs & imaging results that were available during my care of the patient were reviewed by me and considered in my medical decision making (see chart for details).    The rash under his nose looks like impetigo.  I am uncertain why it recurs after treatment with 10 days of doxycycline.  In addition to the doxycycline for his upper respiratory infection/and rash we will going to give him mupirocin to use on the area.  I am giving him 5 days of prednisone for the COPD exacerbation.  I recommend he follow-up for PCP care with [facility]. Final Clinical Impressions(s) / UC Diagnoses   Final diagnoses:  COPD exacerbation ([place])  Impetigo     Discharge Instructions     Take the antibiotic 2 x a day for 10 days Take the prednisone 2 x a day for 5 days Use the cream under nose until the rash clears The cream has a refill Call [facility] for an appointment in follow up   ED Prescriptions    Medication Sig Dispense Auth. Provider   mupirocin cream (BACTROBAN) 2 % Apply 1 application topically 2 (two) times daily. 15 g Raylene Everts, MD   doxycycline (VIBRAMYCIN) 100 MG capsule Take 1 capsule (100 mg total) by mouth 2 (two) times daily. 20 capsule Raylene Everts, MD   predniSONE (DELTASONE) 10 MG tablet Take 2  tablets (20 mg total) by mouth 2 (two) times daily with a meal. 20 tablet Raylene Everts, MD     Controlled Substance Prescriptions [place] Controlled Substance Registry consulted? Not Applicable   Raylene Everts, MD 04/22/18 [phone]

## 2018-05-18 ENCOUNTER — Telehealth: Payer: Self-pay

## 2018-05-18 NOTE — Telephone Encounter (Signed)
Contacted pt to convey that Dr. Sondra Come wanted to see pt in office prior to refilling pain medications. Pt given appt time of 05/20/18 at 0900. Pt stated "well, I'm out now". Conveyed to pt that his appt was the first available. Pt verbalized understanding and agreement. Loma Sousa, RN BSN

## 2018-05-20 ENCOUNTER — Encounter: Payer: Self-pay | Admitting: Radiation Oncology

## 2018-05-20 ENCOUNTER — Ambulatory Visit
Admission: RE | Admit: 2018-05-20 | Discharge: 2018-05-20 | Disposition: A | Discharge status: Home / Self Care | Payer: No Typology Code available for payment source | Source: Ambulatory Visit | Attending: Radiation Oncology | Admitting: Radiation Oncology

## 2018-05-20 ENCOUNTER — Other Ambulatory Visit: Payer: Self-pay

## 2018-05-20 VITALS — BP 140/75 | HR 63 | Temp 98.0°F | Resp 20 | Ht 71.0 in | Wt 137.8 lb

## 2018-05-20 DIAGNOSIS — Z885 Allergy status to narcotic agent status: Secondary | ICD-10-CM | POA: Insufficient documentation

## 2018-05-20 DIAGNOSIS — Z923 Personal history of irradiation: Secondary | ICD-10-CM | POA: Insufficient documentation

## 2018-05-20 DIAGNOSIS — G8929 Other chronic pain: Secondary | ICD-10-CM | POA: Insufficient documentation

## 2018-05-20 DIAGNOSIS — Z08 Encounter for follow-up examination after completed treatment for malignant neoplasm: Secondary | ICD-10-CM | POA: Insufficient documentation

## 2018-05-20 DIAGNOSIS — Z886 Allergy status to analgesic agent status: Secondary | ICD-10-CM | POA: Insufficient documentation

## 2018-05-20 DIAGNOSIS — C210 Malignant neoplasm of anus, unspecified: Secondary | ICD-10-CM | POA: Insufficient documentation

## 2018-05-20 DIAGNOSIS — Z85048 Personal history of other malignant neoplasm of rectum, rectosigmoid junction, and anus: Secondary | ICD-10-CM | POA: Insufficient documentation

## 2018-05-20 LAB — URINALYSIS, COMPLETE (UACMP) WITH MICROSCOPIC
BACTERIA UA: NONE SEEN
BILIRUBIN URINE: NEGATIVE
Glucose, UA: NEGATIVE mg/dL
Hgb urine dipstick: NEGATIVE
KETONES UR: NEGATIVE mg/dL
LEUKOCYTES UA: NEGATIVE
NITRITE: NEGATIVE
PROTEIN: NEGATIVE mg/dL
Specific Gravity, Urine: 1.012 (ref 1.005–1.030)
pH: 6 (ref 5.0–8.0)

## 2018-05-20 MED ORDER — OXYCODONE-ACETAMINOPHEN 5-325 MG PO TABS: 1.0000 | ORAL_TABLET | ORAL | 0 refills | Status: DC | PRN

## 2018-05-20 MED FILL — OXYCODONE-ACETAMINOPHEN 5-3: 5-325 | 16 days supply | Qty: 100 | Fill #0

## 2018-05-20 NOTE — Progress Notes (Signed)
.  jk   Radiation Oncology         [phone]) [phone] ________________________________  Name: Martin [name] MRN: [id number]  Date: 05/20/2018  DOB: 1961-07-06  Follow-Up Visit Note  CC: Patient, No Pcp Per  Armandina Gemma, MD    ICD-10-CM   1. Anal cancer ([place]) C21.0     Diagnosis:  Anal cancer  Interval Since Last Radiation: 6 years, 4 months  11/19/11 - 01/08/12:   Narrative:  The patient returns today for routine follow-up. He reports moderate fatigue, noting that he is not sleeping well. He denies rectal bleeding, nausea or vomiting, and dysuria or hematuria. He reports a sharp pain in his pelvis area that comes and goes, a rash in his groin area that itches, straining with bowel movements, urinary urgency, nocturia x3-4, and not emptying his bladder.  He notes that he recently moved in with his mother after his father passed away and has had difficulty finding work.   ALLERGIES:  is allergic to yellow jacket venom [bee venom]; aspirin; codeine; and morphine and related.  Meds: Current Outpatient Medications  Medication Sig Dispense Refill  . mupirocin cream (BACTROBAN) 2 % Apply 1 application topically 2 (two) times daily. 15 g 1  . oxyCODONE-acetaminophen (PERCOCET/ROXICET) 5-325 MG tablet Take 1 tablet by mouth every 4 (four) hours as needed for severe pain. 100 tablet 0   No current facility-administered medications for this encounter.     Physical Findings: The patient is in no acute distress. Patient is alert and oriented.  height is 5' 11"  (1.803 m) and weight is 137 lb 12.8 oz (62.5 kg). His oral temperature is 98 F (36.7 C). His blood pressure is 140/75 and his pulse is 63. His respiration is 20 and oxygen saturation is 100%.   No palpable supraclavicular or axillary adenopathy. Lungs are clear to auscultation. The heart has a regular rhythm and rate. The abdomen is soft and nontender with normal bowel sounds.  Examination of the inguinal area reveals no adenopathy. No  masses palpated on testicular exam. On rectal exam, sphincter tone is normal. No palpable masses in the anal canal or lower rectal area. Prostate mildly enlarged, no palpable nodules within the prostate.   Lab Findings: Lab Results  Component Value Date   WBC 9.8 02/15/2018   HGB 12.8 (L) 02/15/2018   HCT 39.1 02/15/2018   MCV 95.6 02/15/2018   PLT 154 02/15/2018    Impression/Plan: Anal Cancer There is no evidence of recurrence on clinical exam. Patient has been having significant difficulty with emptying his bladder. We will check PSA and make sure this is not related to prostate cancer or possible prostatitis. We will also order a urinalysis and urine culture. Referral to social work as patient has not been able to obtain gainful employment. He has sought out disability and medicaid but has been denied on two occasions. Continues to have chronic pain in the pelvis, will refill his narcotic pain medication. Routine follow-up in 6 months.  -----------------------------------  Blair Promise, PhD, MD  This document serves as a record of services personally performed by Gery Pray, MD. It was created on his behalf by Wilburn Mylar, a trained medical scribe. The creation of this record is based on the scribe's personal observations and the provider's statements to them. This document has been checked and approved by the attending provider.

## 2018-05-20 NOTE — Progress Notes (Signed)
Pt here today for a follow-up for anal cancer. Pt states that he has a sharp pain in his pelvis area that comes and goes. Pt states that he strains with bowel movements. Pt denies rectal bleeding. Pt denies having any nausea or vomiting. Pt states moderate fatigue. Pt states that he is not sleeping well. Pt denies having dysuria or hematuria. Pt states urgency and is able to make it to the bathroom. Pt states nocturia 3-4 times per night. Pt states that he is not emptying his bladder completely and is returning to the bathroom 10 minutes later. Pt states that he has a rash in his groin area that itches. Pt states that he is using the cream that was prescribed daily.   BP 140/75 (BP Location: Left Arm, Patient Position: Sitting)   Pulse 63   Temp 98 F (36.7 C) (Oral)   Resp 20   Ht 5' 11"  (1.803 m)   Wt 137 lb 12.8 oz (62.5 kg)   SpO2 100%   BMI 19.22 kg/m    Wt Readings from Last 3 Encounters:  05/20/18 137 lb 12.8 oz (62.5 kg)  02/15/18 145 lb (65.8 kg)  11/16/17 140 lb (63.5 kg)

## 2018-05-21 LAB — URINE CULTURE: CULTURE: NO GROWTH

## 2018-05-21 LAB — PROSTATE-SPECIFIC AG, SERUM (LABCORP)

## 2018-06-16 ENCOUNTER — Telehealth: Payer: Self-pay | Admitting: General Practice

## 2018-06-16 ENCOUNTER — Other Ambulatory Visit: Payer: Self-pay | Admitting: Radiation Oncology

## 2018-06-16 MED ORDER — OXYCODONE-ACETAMINOPHEN 5-325 MG PO TABS: 1.0000 | ORAL_TABLET | ORAL | 0 refills | Status: DC | PRN

## 2018-06-16 MED FILL — OXYCODONE-ACETAMINOPHEN 5-3: 5-325 | 16 days supply | Qty: 100 | Fill #0

## 2018-06-16 NOTE — Telephone Encounter (Signed)
[facility] CSW Progress Note  Request received from MD to reach out to patient re resources to regain employment post cancer treatment. Applied for disability and Medicaid, was denied for both.  Not currently working.  CSW called patient, left VM requesting call back if desired.    Edwyna Shell, LCSW Clinical Social Worker Phone:  [phone]

## 2018-07-15 ENCOUNTER — Other Ambulatory Visit: Payer: Self-pay | Admitting: Radiation Oncology

## 2018-07-15 MED ORDER — OXYCODONE-ACETAMINOPHEN 5-325 MG PO TABS: 1.0000 | ORAL_TABLET | ORAL | 0 refills | Status: DC | PRN

## 2018-07-16 MED FILL — OXYCODONE-ACETAMINOPHEN 5-3: 5-325 | 16 days supply | Qty: 100 | Fill #0

## 2018-08-17 ENCOUNTER — Other Ambulatory Visit: Payer: Self-pay | Admitting: Radiation Oncology

## 2018-08-17 MED ORDER — OXYCODONE-ACETAMINOPHEN 5-325 MG PO TABS: 1.0000 | ORAL_TABLET | ORAL | 0 refills | Status: DC | PRN

## 2018-08-17 MED FILL — OXYCODONE-ACETAMINOPHEN 5-3: 5-325 | 17 days supply | Qty: 100 | Fill #0

## 2018-09-15 ENCOUNTER — Other Ambulatory Visit: Payer: Self-pay | Admitting: Radiation Oncology

## 2018-09-15 MED ORDER — OXYCODONE-ACETAMINOPHEN 5-325 MG PO TABS: 1.0000 | ORAL_TABLET | ORAL | 0 refills | Status: DC | PRN

## 2018-09-16 MED FILL — OXYCODONE-ACETAMINOPHEN 5-3: 5-325 | 16 days supply | Qty: 100 | Fill #0

## 2018-10-14 ENCOUNTER — Other Ambulatory Visit: Payer: Self-pay | Admitting: Radiation Oncology

## 2018-10-14 MED ORDER — OXYCODONE-ACETAMINOPHEN 5-325 MG PO TABS: 1.0000 | ORAL_TABLET | ORAL | 0 refills | Status: DC | PRN

## 2018-10-15 MED FILL — OXYCODONE-ACETAMINOPHEN 5-3: 5-325 | 17 days supply | Qty: 100 | Fill #0

## 2018-11-11 ENCOUNTER — Other Ambulatory Visit: Payer: Self-pay | Admitting: Radiation Oncology

## 2018-11-11 MED ORDER — OXYCODONE-ACETAMINOPHEN 5-325 MG PO TABS: 1.0000 | ORAL_TABLET | ORAL | 0 refills | Status: DC | PRN

## 2018-11-12 MED FILL — OXYCODONE-ACETAMINOPHEN 5-3: 5-325 | 16 days supply | Qty: 100 | Fill #0

## 2018-11-22 ENCOUNTER — Other Ambulatory Visit: Payer: Self-pay

## 2018-11-22 ENCOUNTER — Encounter: Payer: Self-pay | Admitting: Radiation Oncology

## 2018-11-22 ENCOUNTER — Ambulatory Visit
Admission: RE | Admit: 2018-11-22 | Discharge: 2018-11-22 | Disposition: A | Discharge status: Home / Self Care | Payer: No Typology Code available for payment source | Source: Ambulatory Visit | Attending: Radiation Oncology | Admitting: Radiation Oncology

## 2018-11-22 VITALS — BP 133/70 | HR 58 | Temp 97.6°F | Resp 18 | Ht 71.0 in | Wt 148.2 lb

## 2018-11-22 DIAGNOSIS — M79604 Pain in right leg: Secondary | ICD-10-CM | POA: Insufficient documentation

## 2018-11-22 DIAGNOSIS — G4700 Insomnia, unspecified: Secondary | ICD-10-CM | POA: Insufficient documentation

## 2018-11-22 DIAGNOSIS — Z923 Personal history of irradiation: Secondary | ICD-10-CM | POA: Insufficient documentation

## 2018-11-22 DIAGNOSIS — Z85048 Personal history of other malignant neoplasm of rectum, rectosigmoid junction, and anus: Secondary | ICD-10-CM | POA: Insufficient documentation

## 2018-11-22 DIAGNOSIS — C210 Malignant neoplasm of anus, unspecified: Secondary | ICD-10-CM

## 2018-11-22 DIAGNOSIS — Z79899 Other long term (current) drug therapy: Secondary | ICD-10-CM | POA: Insufficient documentation

## 2018-11-22 DIAGNOSIS — G8929 Other chronic pain: Secondary | ICD-10-CM | POA: Insufficient documentation

## 2018-11-22 NOTE — Progress Notes (Signed)
Martin [name] is here today for a  Follow-up appointment today.Patient states that he is having pain in his abdominal area. Patient states that he had a bowel resection in August 2019.Patient states that he is having itching at his radiation site ,but he has medication for the itching. Patient states that he has  To strain sometime to use the restroom, Patient states that he has moderate fatigue. Patient denies any n/v. Patient states that his appeitie is ok. Patient denies any diarrhea or bleeding with stools. Patient reports some urgency with urination. Patient states that he is drinking plenty fluids. Vitals:   11/22/18 1057  BP: 133/70  Pulse: (!) 58  Resp: 18  Temp: 97.6 F (36.4 C)  TempSrc: Oral  SpO2: 100%  Weight: 148 lb 4 oz (67.2 kg)  Height: 5' 11"  (1.803 m)   Wt Readings from Last 3 Encounters:  11/22/18 148 lb 4 oz (67.2 kg)  05/20/18 137 lb 12.8 oz (62.5 kg)  02/15/18 145 lb (65.8 kg)

## 2018-11-22 NOTE — Progress Notes (Signed)
.  jk   Radiation Oncology         [phone]) [phone] ________________________________  Name: Martin [name] MRN: [id number]  Date: 11/22/2018  DOB: 1960/10/15  Follow-Up Visit Note  CC: Patient, No Pcp Per  Armandina Gemma, MD    ICD-10-CM   1. Anal cancer ([place]) C21.0     Diagnosis:  Anal cancer  Interval Since Last Radiation: 6 years, 10 months  11/19/11 - 01/08/12:   Narrative:  The patient returns today for follow-up. He reports pain for which he is taking medication. He continues to have pain deep in the pelvis region.  The pain is primarily in his right leg but also in the left. He reports sleep loss/difficulty and difficulty with walking/standing. He reports being turned down for Medicaid and Disability. He denies any other symptoms. He is still living with his mother following his father's death.    ALLERGIES:  is allergic to yellow jacket venom [bee venom]; aspirin; codeine; and morphine and related.  Meds: Current Outpatient Medications  Medication Sig Dispense Refill  . mupirocin cream (BACTROBAN) 2 % Apply 1 application topically 2 (two) times daily. 15 g 1  . oxyCODONE-acetaminophen (PERCOCET/ROXICET) 5-325 MG tablet Take 1 tablet by mouth every 4 (four) hours as needed for severe pain. 100 tablet 0   No current facility-administered medications for this encounter.     Physical Findings: The patient is in no acute distress. Patient is alert and oriented.  height is 5' 11"  (1.803 m) and weight is 148 lb 4 oz (67.2 kg). His oral temperature is 97.6 F (36.4 C). His blood pressure is 133/70 and his pulse is 58 (abnormal). His respiration is 18 and oxygen saturation is 100%.   No palpable supraclavicular or axillary adenopathy. Lungs are clear to auscultation. The heart has a regular rhythm and rate. The abdomen is soft and nontender with normal bowel sounds.  Examination of the inguinal area reveals no adenopathy. No masses palpated on testicular exam. On rectal exam, sphincter  tone is normal. No palpable masses in the anal canal or lower rectal area. Prostate mildly enlarged, no palpable nodules within the prostate.   Lab Findings: Lab Results  Component Value Date   WBC 9.8 02/15/2018   HGB 12.8 (L) 02/15/2018   HCT 39.1 02/15/2018   MCV 95.6 02/15/2018   PLT 154 02/15/2018    Impression/Plan: Anal Cancer There is no evidence of recurrence on clinical exam. Referral to social work as patient has not been able to obtain gainful employment. He has sought out disability and medicaid but has continually been denied. Continues to have chronic pain, will refill his narcotic pain medication. Follow-up in 6 months. He complains of insomnia and have recommended he tried Benadryl and melatonin concerning this issue.  -----------------------------------  Blair Promise, PhD, MD  This document serves as a record of services personally performed by Gery Pray, MD. It was created on his behalf by Mary-Margaret Loma Messing, a trained medical scribe. The creation of this record is based on the scribe's personal observations and the provider's statements to them. This document has been checked and approved by the attending provider.

## 2018-11-24 ENCOUNTER — Telehealth: Payer: Self-pay | Admitting: General Practice

## 2018-11-24 NOTE — Telephone Encounter (Signed)
[facility] CSW Progress Notes  MD requested social work consult as patient has applied and been denied for Medicaid and disability - is concerned about income and access to care.  Called patient.  No longer has [employer], but has been provided w new application.  States he applied for Medicaid through "[facility]" and disability through "[facility] confirms that they did not help w patients disability application and have no information on this.  Patient wants help finding PCP that he can see to address medical issues.  Referred to [facility] for help w establishing for primary care.  Asked patient to follow up w [place] to determine status of his application and next steps.  Can be referred to [facility] if he wants to file a new claim.  Also discussed options for job training which may be available later on this year if patient desire to access these.  Edwyna Shell, LCSW Clinical Social Worker Phone:  [phone]

## 2018-12-14 ENCOUNTER — Other Ambulatory Visit: Payer: Self-pay | Admitting: Radiation Oncology

## 2018-12-14 MED ORDER — OXYCODONE-ACETAMINOPHEN 5-325 MG PO TABS: 1.0000 | ORAL_TABLET | ORAL | 0 refills | Status: DC | PRN

## 2018-12-14 MED FILL — OXYCODONE-ACETAMINOPHEN 5-3: 5-325 | 17 days supply | Qty: 100 | Fill #0

## 2019-01-19 ENCOUNTER — Other Ambulatory Visit: Payer: Self-pay | Admitting: Radiation Oncology

## 2019-01-19 MED ORDER — OXYCODONE-ACETAMINOPHEN 5-325 MG PO TABS: 1.0000 | ORAL_TABLET | ORAL | 0 refills | Status: DC | PRN

## 2019-01-20 MED FILL — OXYCODONE-ACETAMINOPHEN 5-3: 5-325 | 17 days supply | Qty: 100 | Fill #0

## 2019-02-10 ENCOUNTER — Other Ambulatory Visit: Payer: Self-pay | Admitting: Radiation Oncology

## 2019-02-10 MED ORDER — OXYCODONE-ACETAMINOPHEN 5-325 MG PO TABS: 1.0000 | ORAL_TABLET | ORAL | 0 refills | Status: DC | PRN

## 2019-03-15 ENCOUNTER — Other Ambulatory Visit: Payer: Self-pay | Admitting: Radiation Oncology

## 2019-03-15 MED ORDER — OXYCODONE-ACETAMINOPHEN 5-325 MG PO TABS: 1.0000 | ORAL_TABLET | ORAL | 0 refills | Status: DC | PRN

## 2019-04-14 ENCOUNTER — Other Ambulatory Visit: Payer: Self-pay | Admitting: Radiation Oncology

## 2019-04-14 MED ORDER — OXYCODONE-ACETAMINOPHEN 5-325 MG PO TABS: 1.0000 | ORAL_TABLET | ORAL | 0 refills | Status: DC | PRN

## 2019-05-09 ENCOUNTER — Telehealth: Payer: Self-pay | Admitting: *Deleted

## 2019-05-09 NOTE — Telephone Encounter (Signed)
CALLED PATIENT TO ALTER FU ON 05-26-19 DUE TO DR. KINARD BEING ON VACATION, RESCHEDULED FOR 06-02-19 @ 8:30 AM, PATIENT AGREED TO NEW DATE AND TIME

## 2019-05-12 ENCOUNTER — Other Ambulatory Visit: Payer: Self-pay | Admitting: Radiation Oncology

## 2019-05-12 MED ORDER — OXYCODONE-ACETAMINOPHEN 5-325 MG PO TABS: 1.0000 | ORAL_TABLET | ORAL | 0 refills | Status: DC | PRN

## 2019-05-26 ENCOUNTER — Ambulatory Visit: Payer: Self-pay | Admitting: Radiation Oncology

## 2019-06-02 ENCOUNTER — Other Ambulatory Visit: Payer: Self-pay

## 2019-06-02 ENCOUNTER — Encounter: Payer: Self-pay | Admitting: Radiation Oncology

## 2019-06-02 ENCOUNTER — Ambulatory Visit
Admission: RE | Admit: 2019-06-02 | Discharge: 2019-06-02 | Disposition: A | Discharge status: Home / Self Care | Payer: Medicaid Other | Source: Ambulatory Visit | Attending: Radiation Oncology | Admitting: Radiation Oncology

## 2019-06-02 VITALS — BP 155/67 | HR 64 | Temp 98.3°F | Resp 18 | Ht 70.5 in | Wt 144.5 lb

## 2019-06-02 DIAGNOSIS — Z923 Personal history of irradiation: Secondary | ICD-10-CM | POA: Diagnosis not present

## 2019-06-02 DIAGNOSIS — Z85048 Personal history of other malignant neoplasm of rectum, rectosigmoid junction, and anus: Secondary | ICD-10-CM | POA: Diagnosis present

## 2019-06-02 DIAGNOSIS — C210 Malignant neoplasm of anus, unspecified: Secondary | ICD-10-CM

## 2019-06-02 MED ORDER — OXYCODONE-ACETAMINOPHEN 5-325 MG PO TABS: 1.0000 | ORAL_TABLET | ORAL | 0 refills | Status: DC | PRN

## 2019-06-02 NOTE — Progress Notes (Signed)
Patient in for follow up continues to complain of low pelvic groin pain from radiation. Complains of being unable to sleep at nite,has tried melatonin.

## 2019-06-02 NOTE — Progress Notes (Signed)
.  jk   Radiation Oncology         [phone]) [phone] ________________________________  Name: Martin [name] MRN: [id number]  Date: 06/02/2019  DOB: 1961/08/17  Follow-Up Visit Note  CC: Patient, No Pcp Per  Armandina Gemma, MD    ICD-10-CM   1. Anal cancer ([place])  C21.0     Diagnosis:  Anal cancer  Interval Since Last Radiation: 7 years, 4 months, 3 weeks 11/19/11 - 01/08/12: 50.4 Gy in 28 fractions  Narrative:  The patient returns today for follow-up. He reports chronic pain for which he is taking percocet. He continues to have pain deep in the pelvis region.  The pain is primarily in his right leg but also in the left. He reports sleep loss/difficulty and difficulty with walking/standing. He reports applying for Medicaid and Disability again.  Results of this evaluation are pending.Marland Kitchen He denies any other symptoms. He is still living with his mother following his father's death.    ALLERGIES:  is allergic to yellow jacket venom [bee venom]; aspirin; codeine; and morphine and related.  Meds: Current Outpatient Medications  Medication Sig Dispense Refill  . oxyCODONE-acetaminophen (PERCOCET/ROXICET) 5-325 MG tablet Take 1 tablet by mouth every 4 (four) hours as needed for severe pain. 100 tablet 0   No current facility-administered medications for this encounter.     Physical Findings: The patient is in no acute distress. Patient is alert and oriented.  height is 5' 10.5" (1.791 m) and weight is 144 lb 8 oz (65.5 kg). His temporal temperature is 98.3 F (36.8 C). His blood pressure is 155/67 (abnormal) and his pulse is 64. His respiration is 18 and oxygen saturation is 100%.   No palpable supraclavicular or axillary adenopathy. Lungs are clear to auscultation. The heart has a regular rhythm and rate. The abdomen is soft and nontender with normal bowel sounds.  Examination of the inguinal area reveals no adenopathy. No masses palpated on testicular exam. On rectal exam, sphincter tone is  normal. No palpable masses in the anal canal or lower rectal area. Prostate mildly enlarged, no palpable nodules within the prostate.   Lab Findings: Lab Results  Component Value Date   WBC 9.8 02/15/2018   HGB 12.8 (L) 02/15/2018   HCT 39.1 02/15/2018   MCV 95.6 02/15/2018   PLT 154 02/15/2018    Impression/Plan: Anal Cancer There is no evidence of recurrence on clinical exam.  Chronic deep pelvic pain related to his previous radiation therapy.  Seems to be managed well with Percocet as above.  Patient is having more low back pain making it unable for him to work and walk up steps easily.  The patient is reapplied for Medicaid and permanent disability is also pending at this time.  Patient does have difficulty with sleeping I recommend the discussed this with his primary care physician.  He has tried melatonin and other over-the-counter medications without success.   Plan: Routine follow-up in 6 months -----------------------------------  Blair Promise, PhD, MD  This document serves as a record of services personally performed by Gery Pray, MD. It was created on his behalf by Wilburn Mylar, a trained medical scribe. The creation of this record is based on the scribe's personal observations and the provider's statements to them. This document has been checked and approved by the attending provider.

## 2019-06-02 NOTE — Patient Instructions (Signed)
Coronavirus (COVID-19) Are you at risk?  Are you at risk for the Coronavirus (COVID-19)?  To be considered HIGH RISK for Coronavirus (COVID-19), you have to meet the following criteria:  . Traveled to [place], [place], [place], [place] or [place]; or in the [place] to [place], [place], [place], or [place]; and have fever, cough, and shortness of breath within the last 2 weeks of travel OR . Been in close contact with a person diagnosed with COVID-19 within the last 2 weeks and have fever, cough, and shortness of breath . IF YOU DO NOT MEET THESE CRITERIA, YOU ARE CONSIDERED LOW RISK FOR COVID-19.  What to do if you are HIGH RISK for COVID-19?  Marland Kitchen If you are having a medical emergency, call 911. . Seek medical care right away. Before you go to a doctor's office, urgent care or emergency department, call ahead and tell them about your recent travel, contact with someone diagnosed with COVID-19, and your symptoms. You should receive instructions from your physician's office regarding next steps of care.  . When you arrive at healthcare provider, tell the healthcare staff immediately you have returned from visiting [place], [place], [place], [place] or [place]; or traveled in the [place] to [place], [place], [place], or [place]; in the last two weeks or you have been in close contact with a person diagnosed with COVID-19 in the last 2 weeks.   . Tell the health care staff about your symptoms: fever, cough and shortness of breath. . After you have been seen by a medical provider, you will be either: o Tested for (COVID-19) and discharged home on quarantine except to seek medical care if symptoms worsen, and asked to  - Stay home and avoid contact with others until you get your results (4-5 days)  - Avoid travel on public transportation if possible (such as bus, train, or airplane) or o Sent to the Emergency Department by EMS for evaluation, COVID-19 testing, and possible  admission depending on your condition and test results.  What to do if you are LOW RISK for COVID-19?  Reduce your risk of any infection by using the same precautions used for avoiding the common cold or flu:  Marland Kitchen Wash your hands often with soap and warm water for at least 20 seconds.  If soap and water are not readily available, use an alcohol-based hand sanitizer with at least 60% alcohol.  . If coughing or sneezing, cover your mouth and nose by coughing or sneezing into the elbow areas of your shirt or coat, into a tissue or into your sleeve (not your hands). . Avoid shaking hands with others and consider head nods or verbal greetings only. . Avoid touching your eyes, nose, or mouth with unwashed hands.  . Avoid close contact with people who are sick. . Avoid places or events with large numbers of people in one location, like concerts or sporting events. . Carefully consider travel plans you have or are making. . If you are planning any travel outside or inside the [place], visit the CDC's Travelers' Health webpage for the latest health notices. . If you have some symptoms but not all symptoms, continue to monitor at home and seek medical attention if your symptoms worsen. . If you are having a medical emergency, call 911.   [place] / e-Visit: [email or web address]         [facility]: [facility]: [facility]: [phone]

## 2019-06-29 ENCOUNTER — Other Ambulatory Visit: Payer: Self-pay | Admitting: Radiation Oncology

## 2019-06-29 MED ORDER — OXYCODONE-ACETAMINOPHEN 5-325 MG PO TABS: 1.0000 | ORAL_TABLET | ORAL | 0 refills | Status: DC | PRN

## 2019-07-31 ENCOUNTER — Other Ambulatory Visit: Payer: Self-pay | Admitting: Radiation Oncology

## 2019-07-31 MED ORDER — OXYCODONE-ACETAMINOPHEN 5-325 MG PO TABS: 1.0000 | ORAL_TABLET | ORAL | 0 refills | Status: DC | PRN

## 2019-08-24 ENCOUNTER — Other Ambulatory Visit: Payer: Self-pay | Admitting: Radiation Oncology

## 2019-08-24 MED ORDER — OXYCODONE-ACETAMINOPHEN 5-325 MG PO TABS: 1.0000 | ORAL_TABLET | ORAL | 0 refills | Status: DC | PRN

## 2019-09-26 ENCOUNTER — Other Ambulatory Visit: Payer: Self-pay | Admitting: Radiation Oncology

## 2019-09-26 MED ORDER — OXYCODONE-ACETAMINOPHEN 5-325 MG PO TABS: 1.0000 | ORAL_TABLET | ORAL | 0 refills | Status: DC | PRN

## 2019-09-29 ENCOUNTER — Ambulatory Visit
Admission: RE | Admit: 2019-09-29 | Discharge: 2019-09-29 | Disposition: A | Discharge status: Home / Self Care | Payer: No Typology Code available for payment source | Source: Ambulatory Visit | Attending: Radiation Oncology | Admitting: Radiation Oncology

## 2019-10-15 ENCOUNTER — Emergency Department (HOSPITAL_COMMUNITY): Payer: Medicaid Other

## 2019-10-15 ENCOUNTER — Other Ambulatory Visit: Payer: Self-pay

## 2019-10-15 ENCOUNTER — Inpatient Hospital Stay (HOSPITAL_COMMUNITY)
Admission: EM | Admit: 2019-10-15 | Discharge: 2019-10-18 | DRG: 392 | Discharge status: Against Medical Advice | Payer: Medicaid Other | Attending: Internal Medicine | Admitting: Internal Medicine

## 2019-10-15 ENCOUNTER — Encounter (HOSPITAL_COMMUNITY): Payer: Self-pay | Admitting: Emergency Medicine

## 2019-10-15 DIAGNOSIS — R748 Abnormal levels of other serum enzymes: Secondary | ICD-10-CM

## 2019-10-15 DIAGNOSIS — Z85048 Personal history of other malignant neoplasm of rectum, rectosigmoid junction, and anus: Secondary | ICD-10-CM

## 2019-10-15 DIAGNOSIS — I10 Essential (primary) hypertension: Secondary | ICD-10-CM

## 2019-10-15 DIAGNOSIS — I700 Atherosclerosis of aorta: Secondary | ICD-10-CM | POA: Diagnosis present

## 2019-10-15 DIAGNOSIS — F1010 Alcohol abuse, uncomplicated: Secondary | ICD-10-CM | POA: Diagnosis present

## 2019-10-15 DIAGNOSIS — R945 Abnormal results of liver function studies: Secondary | ICD-10-CM | POA: Diagnosis present

## 2019-10-15 DIAGNOSIS — K529 Noninfective gastroenteritis and colitis, unspecified: Principal | ICD-10-CM

## 2019-10-15 DIAGNOSIS — Z885 Allergy status to narcotic agent status: Secondary | ICD-10-CM

## 2019-10-15 DIAGNOSIS — R7401 Elevation of levels of liver transaminase levels: Secondary | ICD-10-CM | POA: Diagnosis present

## 2019-10-15 DIAGNOSIS — K648 Other hemorrhoids: Secondary | ICD-10-CM | POA: Diagnosis present

## 2019-10-15 DIAGNOSIS — K5000 Crohn's disease of small intestine without complications: Secondary | ICD-10-CM

## 2019-10-15 DIAGNOSIS — R911 Solitary pulmonary nodule: Secondary | ICD-10-CM | POA: Diagnosis present

## 2019-10-15 DIAGNOSIS — R3129 Other microscopic hematuria: Secondary | ICD-10-CM | POA: Diagnosis present

## 2019-10-15 DIAGNOSIS — F419 Anxiety disorder, unspecified: Secondary | ICD-10-CM | POA: Diagnosis present

## 2019-10-15 DIAGNOSIS — F17210 Nicotine dependence, cigarettes, uncomplicated: Secondary | ICD-10-CM | POA: Diagnosis present

## 2019-10-15 DIAGNOSIS — M5136 Other intervertebral disc degeneration, lumbar region: Secondary | ICD-10-CM | POA: Diagnosis present

## 2019-10-15 DIAGNOSIS — F19231 Other psychoactive substance dependence with withdrawal delirium: Secondary | ICD-10-CM | POA: Diagnosis present

## 2019-10-15 DIAGNOSIS — D72829 Elevated white blood cell count, unspecified: Secondary | ICD-10-CM | POA: Diagnosis present

## 2019-10-15 DIAGNOSIS — M170 Bilateral primary osteoarthritis of knee: Secondary | ICD-10-CM | POA: Diagnosis present

## 2019-10-15 DIAGNOSIS — K219 Gastro-esophageal reflux disease without esophagitis: Secondary | ICD-10-CM | POA: Diagnosis present

## 2019-10-15 DIAGNOSIS — Z8249 Family history of ischemic heart disease and other diseases of the circulatory system: Secondary | ICD-10-CM

## 2019-10-15 DIAGNOSIS — R451 Restlessness and agitation: Secondary | ICD-10-CM | POA: Diagnosis not present

## 2019-10-15 DIAGNOSIS — Z800 Family history of malignant neoplasm of digestive organs: Secondary | ICD-10-CM

## 2019-10-15 DIAGNOSIS — E876 Hypokalemia: Secondary | ICD-10-CM | POA: Diagnosis present

## 2019-10-15 DIAGNOSIS — Z825 Family history of asthma and other chronic lower respiratory diseases: Secondary | ICD-10-CM

## 2019-10-15 DIAGNOSIS — R112 Nausea with vomiting, unspecified: Secondary | ICD-10-CM

## 2019-10-15 DIAGNOSIS — Z20822 Contact with and (suspected) exposure to covid-19: Secondary | ICD-10-CM | POA: Diagnosis present

## 2019-10-15 DIAGNOSIS — Z888 Allergy status to other drugs, medicaments and biological substances status: Secondary | ICD-10-CM

## 2019-10-15 LAB — COMPREHENSIVE METABOLIC PANEL
ALT: 187 U/L — ABNORMAL HIGH (ref 0–44)
AST: 84 U/L — ABNORMAL HIGH (ref 15–41)
Albumin: 3.6 g/dL (ref 3.5–5.0)
Alkaline Phosphatase: 219 U/L — ABNORMAL HIGH (ref 38–126)
Anion gap: 9 (ref 5–15)
BUN: 15 mg/dL (ref 6–20)
CO2: 22 mmol/L (ref 22–32)
Calcium: 9.3 mg/dL (ref 8.9–10.3)
Chloride: 102 mmol/L (ref 98–111)
Creatinine, Ser: 0.73 mg/dL (ref 0.61–1.24)
GFR calc Af Amer: >60 mL/min (ref >60)
GFR calc non Af Amer: >60 mL/min (ref >60)
Glucose, Bld: 113 mg/dL — ABNORMAL HIGH (ref 70–99)
Potassium: 4.2 mmol/L (ref 3.5–5.1)
Sodium: 133 mmol/L — ABNORMAL LOW (ref 135–145)
Total Bilirubin: 1 mg/dL (ref 0.3–1.2)
Total Protein: 7.2 g/dL (ref 6.5–8.1)

## 2019-10-15 LAB — URINALYSIS, ROUTINE W REFLEX MICROSCOPIC
Bacteria, UA: NONE SEEN
Bilirubin Urine: NEGATIVE
Glucose, UA: NEGATIVE mg/dL
Ketones, ur: NEGATIVE mg/dL
Leukocytes,Ua: NEGATIVE
Nitrite: NEGATIVE
Protein, ur: 30 mg/dL — AB
Specific Gravity, Urine: 1.019 (ref 1.005–1.030)
pH: 7 (ref 5.0–8.0)

## 2019-10-15 LAB — CBC WITH DIFFERENTIAL/PLATELET
Abs Immature Granulocytes: 0.18 10*3/uL — ABNORMAL HIGH (ref 0.00–0.07)
Basophils Absolute: 0.1 10*3/uL (ref 0.0–0.1)
Basophils Relative: 0 %
Eosinophils Absolute: 0 10*3/uL (ref 0.0–0.5)
Eosinophils Relative: 0 %
HCT: 41.4 % (ref 39.0–52.0)
Hemoglobin: 14.2 g/dL (ref 13.0–17.0)
Immature Granulocytes: 1 %
Lymphocytes Relative: 5 %
Lymphs Abs: 1.1 10*3/uL (ref 0.7–4.0)
MCH: 31.2 pg (ref 26.0–34.0)
MCHC: 34.3 g/dL (ref 30.0–36.0)
MCV: 91 fL (ref 80.0–100.0)
Monocytes Absolute: 1 10*3/uL (ref 0.1–1.0)
Monocytes Relative: 5 %
Neutro Abs: 18.8 10*3/uL — ABNORMAL HIGH (ref 1.7–7.7)
Neutrophils Relative %: 89 %
Platelets: 153 10*3/uL (ref 150–400)
RBC: 4.55 MIL/uL (ref 4.22–5.81)
RDW: 14.1 % (ref 11.5–15.5)
WBC: 21.1 10*3/uL — ABNORMAL HIGH (ref 4.0–10.5)
nRBC: 0 % (ref 0.0–0.2)

## 2019-10-15 LAB — LIPASE, BLOOD: Lipase: 17 U/L (ref 11–51)

## 2019-10-15 LAB — RESPIRATORY PANEL BY RT PCR (FLU A&B, COVID)
Influenza A by PCR: NEGATIVE
Influenza B by PCR: NEGATIVE
SARS Coronavirus 2 by RT PCR: NEGATIVE

## 2019-10-15 MED ORDER — ENOXAPARIN SODIUM 40 MG/0.4ML ~~LOC~~ SOLN
40.0000 mg | SUBCUTANEOUS | Status: DC
  Administered 2019-10-15 – 2019-10-17 (×3): 40 mg via SUBCUTANEOUS
  Filled 2019-10-15 (×3): qty 0.4

## 2019-10-15 MED ORDER — IOHEXOL 300 MG/ML  SOLN
100.0000 mL | Freq: Once | INTRAMUSCULAR | Status: AC | PRN
  Administered 2019-10-15: 100 mL via INTRAVENOUS

## 2019-10-15 MED ORDER — HYDROMORPHONE HCL 1 MG/ML IJ SOLN
1.0000 mg | Freq: Once | INTRAMUSCULAR | Status: AC
  Administered 2019-10-15: 1 mg via INTRAVENOUS
  Filled 2019-10-15: qty 1

## 2019-10-15 MED ORDER — SODIUM CHLORIDE 0.9 % IV SOLN: INTRAVENOUS | Status: AC

## 2019-10-15 MED ORDER — FAMOTIDINE IN NACL 20-0.9 MG/50ML-% IV SOLN
20.0000 mg | Freq: Two times a day (BID) | INTRAVENOUS | Status: DC
  Administered 2019-10-15 – 2019-10-18 (×6): 20 mg via INTRAVENOUS
  Filled 2019-10-15 (×6): qty 50

## 2019-10-15 MED ORDER — HYDRALAZINE HCL 20 MG/ML IJ SOLN
10.0000 mg | Freq: Four times a day (QID) | INTRAMUSCULAR | Status: DC | PRN
  Administered 2019-10-15 – 2019-10-16 (×4): 10 mg via INTRAVENOUS
  Filled 2019-10-15 (×4): qty 1

## 2019-10-15 MED ORDER — PIPERACILLIN-TAZOBACTAM 3.375 G IVPB 30 MIN
3.3750 g | Freq: Once | INTRAVENOUS | Status: AC
  Administered 2019-10-15: 3.375 g via INTRAVENOUS
  Filled 2019-10-15: qty 50

## 2019-10-15 MED ORDER — PROMETHAZINE HCL 25 MG/ML IJ SOLN
12.5000 mg | Freq: Four times a day (QID) | INTRAMUSCULAR | Status: DC | PRN
  Administered 2019-10-15: 12.5 mg via INTRAVENOUS
  Filled 2019-10-15: qty 1

## 2019-10-15 MED ORDER — ACETAMINOPHEN 325 MG PO TABS
650.0000 mg | ORAL_TABLET | Freq: Four times a day (QID) | ORAL | Status: DC | PRN
  Administered 2019-10-16 (×2): 650 mg via ORAL
  Filled 2019-10-15 (×2): qty 2

## 2019-10-15 MED ORDER — ONDANSETRON HCL 4 MG/2ML IJ SOLN
4.0000 mg | Freq: Once | INTRAMUSCULAR | Status: AC
  Administered 2019-10-15: 18:00:00 4 mg via INTRAVENOUS
  Filled 2019-10-15: qty 2

## 2019-10-15 MED ORDER — CIPROFLOXACIN IN D5W 400 MG/200ML IV SOLN
400.0000 mg | Freq: Two times a day (BID) | INTRAVENOUS | Status: DC
  Administered 2019-10-16 – 2019-10-18 (×6): 400 mg via INTRAVENOUS
  Filled 2019-10-15 (×6): qty 200

## 2019-10-15 MED ORDER — HYDROMORPHONE HCL 1 MG/ML IJ SOLN
0.5000 mg | INTRAMUSCULAR | Status: DC | PRN
  Administered 2019-10-15 – 2019-10-16 (×3): 0.5 mg via INTRAVENOUS
  Filled 2019-10-15 (×3): qty 0.5

## 2019-10-15 MED ORDER — ONDANSETRON HCL 4 MG/2ML IJ SOLN: 4.0000 mg | Freq: Four times a day (QID) | INTRAMUSCULAR | Status: DC | PRN

## 2019-10-15 MED ORDER — SODIUM CHLORIDE 0.9 % IV BOLUS
1000.0000 mL | Freq: Once | INTRAVENOUS | Status: AC
  Administered 2019-10-15: 1000 mL via INTRAVENOUS

## 2019-10-15 MED ORDER — SODIUM CHLORIDE (PF) 0.9 % IJ SOLN
INTRAMUSCULAR | Status: AC
  Filled 2019-10-15: qty 50

## 2019-10-15 MED ORDER — METRONIDAZOLE IN NACL 5-0.79 MG/ML-% IV SOLN
500.0000 mg | Freq: Three times a day (TID) | INTRAVENOUS | Status: DC
  Administered 2019-10-15 – 2019-10-18 (×9): 500 mg via INTRAVENOUS
  Filled 2019-10-15 (×9): qty 100

## 2019-10-15 MED ORDER — ACETAMINOPHEN 650 MG RE SUPP: 650.0000 mg | Freq: Four times a day (QID) | RECTAL | Status: DC | PRN

## 2019-10-15 MED ORDER — DIPHENHYDRAMINE HCL 50 MG/ML IJ SOLN
25.0000 mg | Freq: Four times a day (QID) | INTRAMUSCULAR | Status: DC | PRN
  Administered 2019-10-16: 25 mg via INTRAVENOUS
  Filled 2019-10-15: qty 1

## 2019-10-15 NOTE — ED Provider Notes (Signed)
Medical screening examination/treatment/procedure(s) were conducted as a shared visit with non-physician practitioner(s) and myself.  I personally evaluated the patient during the encounter.    59 year old male here complaining of nausea vomiting for the past several days.  Patient has significant leukocytosis noted on CBC.  Abdominal CT consistent with enteritis.  Patient given Zosyn here and will be admitted to the hospital   Lacretia Leigh, MD 10/15/19 2017

## 2019-10-15 NOTE — ED Triage Notes (Signed)
Per EMS, Pt called complaining of N/V for the past couple of days. Last emesis apprx 1 hr ago. Pt complaining of abd pain in lower quadrants as well. Pt has hx of bowel removal apprx 2 years ago related to bowel obstruction.

## 2019-10-15 NOTE — ED Notes (Signed)
Report called to nurse [facility] for pt transfer to the floor

## 2019-10-15 NOTE — ED Provider Notes (Signed)
[facility] DEPT Provider Note   CSN: [id number] Arrival date & time: 10/15/19  1643     History Chief Complaint  Patient presents with  . Emesis    Martin [name] is a 59 y.o. male with a past medical history of anal cancer, degenerative disc disease, history of IV drug use, lymphocytic colitis, and hypertension who presents to the emergency department with a chief complaint of abdominal pain.  Patient presents for abdominal pain, nausea and vomiting for the past 2 days.  He states he is also having symptoms of urinary retention states that he has to strain heavily to make urine or bowel movement.  He states that this causes severe pain.  He denies any fever or chills but has had associated nausea and vomiting.  He denies diarrhea.  He has a history of radiation to the pelvis and inguinal area.  HPI     Past Medical History:  Diagnosis Date  . Allergy 2 2013  . 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 Anxiety   . Arthritis    DDD lumbar, arthritis knees  . DJD (degenerative joint disease) of lumbar spine   . GERD (gastroesophageal reflux disease)   . Hemorrhoid    internal  . History of bowel resection   . Hypertension    EKG 12/12 EPIC   no  PCP- states increased lately but hasnt been diagnosed formally  . Inguinal hernia   . Pneumonia    as child, cough at present with no fever  . Pneumonia    as child, cough at present with no fever   . Radiation 11/19/11-01/08/12   5040 cGy 28 fx Pelvis and inguinal area    Patient Active Problem List   Diagnosis Date Noted  . GAD (generalized anxiety disorder) 05/18/2017  . 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 Constipation 04/11/2017  . Bee sting 04/05/2017  . Diverticulosis 11/29/2014  . Healthcare maintenance 11/06/2014  . Cough 10/05/2014  . Insomnia 09/13/2014  . Erectile dysfunction 11/30/2013  . Chronic pain syndrome 11/30/2013  . Depression 11/30/2013  .  GERD (gastroesophageal reflux disease)   . Hypertension   . Anxiety   . Arthritis   . DJD (degenerative joint disease) of lumbar spine   . Radiation   . Night sweats 08/12/2012  . Tobacco abuse counseling 05/26/2012  . 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 B12 nutritional deficiency 03/19/2012  . Folate deficiency 03/19/2012  . Inguinal hernia 11/24/2011  . Cancer ([place]) 10/14/2011  . Internal hemorrhoid 09/11/2011  . Anal cancer ([place]) 10/09/2010    Past Surgical History:  Procedure Laterality Date  . APPENDECTOMY     age 45 or 49  . EXAMINATION UNDER ANESTHESIA  10/14/2011   Procedure: EXAM UNDER ANESTHESIA;  Surgeon: Earnstine Regal, MD;  Location: [facility];  Service: General;  Laterality: N/A;  exam under anethesia, Excision of mass anal canal, 1.5cm  . INGUINAL HERNIA REPAIR  05/11/2012   Procedure: HERNIA REPAIR INGUINAL ADULT;  Surgeon: Earnstine Regal, MD;  Location: [facility];  Service: General;  Laterality: Left;  left inguinal hernia repair with mesh  . surgical pathology   10/14/2011   squamous cell ca of anus  . transanal excision  10/14/2011   Dr.Todd Gerkin       Family History  Problem Relation Age of Onset  . Colon cancer Father   . Asthma Father   . Cancer  Father        prostate  . Hypertension Father   . Asthma Mother   . Hypertension Mother   . Stomach cancer Neg Hx   . Esophageal cancer Neg Hx     Social History   Tobacco Use  . Smoking status: Current Every Day Smoker    Packs/day: 1.25    Years: 47.00    Pack years: 58.75    Types: Cigarettes  . Smokeless tobacco: Never Used  . Tobacco comment: Cutting back>> Quit Smart card supplied  Substance Use Topics  . Alcohol use: Yes    Alcohol/week: 6.0 standard drinks    Types: 6 Cans of beer per week    Comment: weekend    6pk weekend, beer   . Drug use: Yes    Types: Marijuana, Cocaine    Comment: marijuana in past, occasionally now-last use 3-4 weeks ago    Home Medications Prior to  Admission medications   Medication Sig Start Date End Date Taking? Authorizing Provider  oxyCODONE-acetaminophen (PERCOCET/ROXICET) 5-325 MG tablet Take 1 tablet by mouth every 4 (four) hours as needed for severe pain. 09/26/19   Gery Pray, MD    Allergies    Yellow jacket venom [bee venom], Aspirin, Codeine, and Morphine and related  Review of Systems   Review of Systems Ten systems reviewed and are negative for acute change, except as noted in the HPI.   Physical Exam Updated Vital Signs BP (!) 221/90 (BP Location: Right Arm)   Pulse 88   Temp 98.1 F (36.7 C) (Oral)   Resp 20   Ht 5' 10.5" (1.791 m)   Wt 67.1 kg   SpO2 98%   BMI 20.94 kg/m   Physical Exam Vitals and nursing note reviewed.  Constitutional:      General: He is not in acute distress.    Appearance: He is well-developed. He is not diaphoretic.  HENT:     Head: Normocephalic and atraumatic.  Eyes:     General: No scleral icterus.    Extraocular Movements: Extraocular movements intact.     Conjunctiva/sclera: Conjunctivae normal.     Pupils: Pupils are equal, round, and reactive to light.  Cardiovascular:     Rate and Rhythm: Normal rate and regular rhythm.     Heart sounds: Normal heart sounds.  Pulmonary:     Effort: Pulmonary effort is normal. No respiratory distress.     Breath sounds: Normal breath sounds.  Abdominal:     Palpations: Abdomen is soft.     Tenderness: There is no abdominal tenderness.  Musculoskeletal:     Cervical back: Normal range of motion and neck supple.  Skin:    General: Skin is warm and dry.  Neurological:     Mental Status: He is alert.  Psychiatric:        Behavior: Behavior normal.     ED Results / Procedures / Treatments   Labs (all labs ordered are listed, but only abnormal results are displayed) Labs Reviewed  CBC WITH DIFFERENTIAL/PLATELET  COMPREHENSIVE METABOLIC PANEL  LIPASE, BLOOD  URINALYSIS, ROUTINE W REFLEX MICROSCOPIC     EKG None  Radiology No results found.  Procedures Ultrasound ED Peripheral IV (Provider)  Date/Time: 10/15/2019 5:59 PM Performed by: Margarita Mail, PA-C Authorized by: Margarita Mail, PA-C   Procedure details:    Indications: multiple failed IV attempts and poor IV access     Skin Prep: chlorhexidine gluconate     Location:  Left AC  Angiocath:  20 G   Bedside Ultrasound Guided: Yes     Images: not archived     Patient tolerated procedure without complications: Yes     Dressing applied: Yes     (including critical care time)  Medications Ordered in ED Medications  sodium chloride 0.9 % bolus 1,000 mL (has no administration in time range)  ondansetron (ZOFRAN) injection 4 mg (has no administration in time range)  HYDROmorphone (DILAUDID) injection 1 mg (has no administration in time range)    ED Course  I have reviewed the triage vital signs and the nursing notes.  Pertinent labs & imaging results that were available during my care of the patient were reviewed by me and considered in my medical decision making (see chart for details).  Clinical Course as of Oct 14 1928  Sat Oct 15, 2019  1916 AST(!): 84 [AH]  1916 ALT(!): 187 [AH]  1916 Alkaline Phosphatase(!): 219 [AH]    Clinical Course User Index [AH] Margarita Mail, PA-C   MDM Rules/Calculators/A&P                     59 year old male here with complaint of abdominal pain nausea and vomiting. The emergent differential diagnosis for vomiting includes, but is not limited to ACS/MI, Boerhaave's, DKA, Intracranial Hemorrhage, Ischemic bowel, Meningitis, Sepsis, Acute radiation syndrome, Acute gastric dilation, Acetaminophen toxicity, Adrenal insufficiency, Appendicitis, Aspirin toxicity, Bowel obstruction/ileus, Carbon monoxide poisoning, Cholecystitis, CNS tumor. Digoxin toxicity, Electrolyte abnormalities, Elevated ICP, Gastric outlet obstruction, Hyperemesis gravidarum, Pancreatitis, Peritonitis,  Ruptured viscus, Testicular torsion/ovarian torsion, Theophyline toxicity, Biliary colic, Cannabinoid hyperemesis syndrome, Chemotherapy, Disulfiram effect, Erythromycin, ETOH, Gastritis, Gastroenteritis, Gastroparesis, Hepatitis, Ibuprofen, Ipecac toxicity, Labyrinthitis, Migraine, Motion sickness, Narcotic withdrawal, Thyroid, Pregnancy, Peptic ulcer disease, Renal colic, and UTI I personally reviewed the patient's labs which shows elevated white blood cell count of 21,000 with left shift, CMP shows mildly decreased sodium, elevated liver enzymes.  Is within normal limits.  Urine shows no acute abnormalities.  Personally reviewed the patient's CT abdomen pelvis which shows terminal ileitis either infectious versus inflammatory and a new 8 x 8 mm nodule in the right lung base which will need further outpatient follow-up.  Patient notably hypertensive throughout his visit.  Patient started on Zosyn, pain improved in the emergency department.  He will be admitted by Dr. Maudie Mercury.  He is stable for admission at this time.  Final Clinical Impression(s) / ED Diagnoses Final diagnoses:  None    Rx / DC Orders ED Discharge Orders    None       Margarita Mail, PA-C 10/15/19 2037    Lacretia Leigh, MD 10/18/19 [phone]

## 2019-10-15 NOTE — Progress Notes (Signed)
A consult was received from an ED physician for Zosyn per pharmacy dosing.  The patient's profile has been reviewed for ht/wt/allergies/indication/available labs.   A one time order has been placed for Zosyn 3.375gm IV.  Further antibiotics/pharmacy consults should be ordered by admitting physician if indicated.                       Thank you, Netta Cedars, PharmD, BCPS 10/15/2019  7:01 PM

## 2019-10-15 NOTE — ED Notes (Signed)
Pt transported to CT ?

## 2019-10-15 NOTE — H&P (Addendum)
TRH H&P    Patient Demographics:    Kenji Mapel, is a 59 y.o. male  MRN: [id number]  DOB - July 07, 1961  Admit Date - 10/15/2019  Referring MD/NP/PA: Margarita Mail  Outpatient Primary MD for the patient is Patient, No Pcp Per  Patient coming from:  home  Chief complaint- n/v   HPI:    Dmari Schubring  is a 59 y.o. male,  w h/o anal cancer, gerd,  anxiety, hypertension, presents with c/o n/v x 2days. Pt denies fever, chills, cough, cp, palp, abd pain, diarrhea, brbpr, black stool.    In ED,  T 98.1, P 88, R 20, Bp 221/90  Pox 98% on RA Wt 67.1kg  CT scan abd/ pelvis IMPRESSION: 1. 7-10 cm segment of terminal ileum shows circumferential wall thickening and wall edema without substantial perienteric edema or inflammation. Imaging features are compatible with an infectious/inflammatory enteritis. No evidence for perforation or abscess. Abnormal ileum is nonobstructing. 2. New 8 x 8 mm nodular component to some scarring in the medial right lung base. This may be related to scarring or infection/inflammation, but follow-up CT chest without contrast in 3 months is recommended to ensure resolution. 3. Aortic Atherosclerosis (ICD10-I70.0).  Urinalysis rbc 11-20, wbc 0-5 Wbc 21.1, hgb 14.2, Plt 153 Na 133, K 4.2, Bun 15, Creatinine 0.73 Ast 84, Alt 187, Alk phos 219, T. Bili 1.0  Pt will be admitted for n/v, enteritis and also abnormal liver function.     Review of systems:    In addition to the HPI above,  No Fever-chills, No Headache, No changes with Vision or hearing, No problems swallowing food or Liquids, No Chest pain, Cough or Shortness of Breath,   , bowel movements are regular, No Blood in stool or Urine, No dysuria, No new skin rashes or bruises, No new joints pains-aches,  No new weakness, tingling, numbness in any extremity, No recent weight gain or loss, No polyuria, polydypsia or  polyphagia, No significant Mental Stressors.  All other systems reviewed and are negative.    Past History of the following :    Past Medical History:  Diagnosis Date  . Allergy 2 2013  . 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 Anxiety   . Arthritis    DDD lumbar, arthritis knees  . DJD (degenerative joint disease) of lumbar spine   . GERD (gastroesophageal reflux disease)   . Hemorrhoid    internal  . History of bowel resection   . Hypertension    EKG 12/12 EPIC   no  PCP- states increased lately but hasnt been diagnosed formally  . Inguinal hernia   . Pneumonia    as child, cough at present with no fever  . Pneumonia    as child, cough at present with no fever   . Radiation 11/19/11-01/08/12   5040 cGy 28 fx Pelvis and inguinal area      Past Surgical History:  Procedure Laterality Date  . APPENDECTOMY     age  15 or 16  . EXAMINATION UNDER ANESTHESIA  10/14/2011   Procedure: EXAM UNDER ANESTHESIA;  Surgeon: Earnstine Regal, MD;  Location: [facility];  Service: General;  Laterality: N/A;  exam under anethesia, Excision of mass anal canal, 1.5cm  . INGUINAL HERNIA REPAIR  05/11/2012   Procedure: HERNIA REPAIR INGUINAL ADULT;  Surgeon: Earnstine Regal, MD;  Location: [facility];  Service: General;  Laterality: Left;  left inguinal hernia repair with mesh  . surgical pathology   10/14/2011   squamous cell ca of anus  . transanal excision  10/14/2011   Dr.Todd Gerkin      Social History:      Social History   Tobacco Use  . Smoking status: Current Every Day Smoker    Packs/day: 1.25    Years: 47.00    Pack years: 58.75    Types: Cigarettes  . Smokeless tobacco: Never Used  . Tobacco comment: Cutting back>> Quit Smart card supplied  Substance Use Topics  . Alcohol use: Yes    Alcohol/week: 6.0 standard drinks    Types: 6 Cans of beer per week    Comment: weekend    6pk weekend, beer        Family History :     Family  History  Problem Relation Age of Onset  . Colon cancer Father   . Asthma Father   . Cancer Father        prostate  . Hypertension Father   . Asthma Mother   . Hypertension Mother   . Stomach cancer Neg Hx   . Esophageal cancer Neg Hx        Home Medications:   Prior to Admission medications   Medication Sig Start Date End Date Taking? Authorizing Provider  oxyCODONE-acetaminophen (PERCOCET/ROXICET) 5-325 MG tablet Take 1 tablet by mouth every 4 (four) hours as needed for severe pain. 09/26/19  Yes Gery Pray, MD     Allergies:     Allergies  Allergen Reactions  . Yellow Jacket Venom [Bee Venom] Anaphylaxis  . Aspirin Swelling  . Codeine Nausea Only  . Morphine And Related Itching     Physical Exam:   Vitals  Blood pressure (!) 184/70, pulse 83, temperature 98.1 F (36.7 C), temperature source Oral, resp. rate 16, height 5' 10.5" (1.791 m), weight 67.1 kg, SpO2 98 %.  1.  General: axoxo3  2. Psychiatric: euthymic  3. Neurologic: nonfocal  4. HEENMT:  Anicteric, pupils 1.56m symmetric, direct, consensual, near intact Neck: no jvd  5. Respiratory : CTAB  6. Cardiovascular : rrr s1, s2, no m/g/r  7. Gastrointestinal:  ABd: soft, nt, nd, +bs  8. Skin:  Ext: no c/c/e,  + tatoos  9.Musculoskeletal:  Good ROM     Data Review:    CBC Recent Labs  Lab 10/15/19 1735  WBC 21.1*  HGB 14.2  HCT 41.4  PLT 153  MCV 91.0  MCH 31.2  MCHC 34.3  RDW 14.1  LYMPHSABS 1.1  MONOABS 1.0  EOSABS 0.0  BASOSABS 0.1   ------------------------------------------------------------------------------------------------------------------  Results for orders placed or performed during the hospital encounter of 10/15/19 (from the past 48 hour(s))  CBC with Differential     Status: Abnormal   Collection Time: 10/15/19  5:35 PM  Result Value Ref Range   WBC 21.1 (H) 4.0 - 10.5 K/uL   RBC 4.55 4.22 - 5.81 MIL/uL   Hemoglobin 14.2 13.0 - 17.0 g/dL   HCT 41.4  39.0 - 52.0 %  MCV 91.0 80.0 - 100.0 fL   MCH 31.2 26.0 - 34.0 pg   MCHC 34.3 30.0 - 36.0 g/dL   RDW 14.1 11.5 - 15.5 %   Platelets 153 150 - 400 K/uL   nRBC 0.0 0.0 - 0.2 %   Neutrophils Relative % 89 %   Neutro Abs 18.8 (H) 1.7 - 7.7 K/uL   Lymphocytes Relative 5 %   Lymphs Abs 1.1 0.7 - 4.0 K/uL   Monocytes Relative 5 %   Monocytes Absolute 1.0 0.1 - 1.0 K/uL   Eosinophils Relative 0 %   Eosinophils Absolute 0.0 0.0 - 0.5 K/uL   Basophils Relative 0 %   Basophils Absolute 0.1 0.0 - 0.1 K/uL   Immature Granulocytes 1 %   Abs Immature Granulocytes 0.18 (H) 0.00 - 0.07 K/uL    Comment: Performed at [facility], [place] [address] [place], [place] [address]  Comprehensive metabolic panel     Status: Abnormal   Collection Time: 10/15/19  5:35 PM  Result Value Ref Range   Sodium 133 (L) 135 - 145 mmol/L   Potassium 4.2 3.5 - 5.1 mmol/L   Chloride 102 98 - 111 mmol/L   CO2 22 22 - 32 mmol/L   Glucose, Bld 113 (H) 70 - 99 mg/dL   BUN 15 6 - 20 mg/dL   Creatinine, Ser 0.73 0.61 - 1.24 mg/dL   Calcium 9.3 8.9 - 10.3 mg/dL   Total Protein 7.2 6.5 - 8.1 g/dL   Albumin 3.6 3.5 - 5.0 g/dL   AST 84 (H) 15 - 41 U/L   ALT 187 (H) 0 - 44 U/L   Alkaline Phosphatase 219 (H) 38 - 126 U/L   Total Bilirubin 1.0 0.3 - 1.2 mg/dL   GFR calc non Af Amer >60 >60 mL/min   GFR calc Af Amer >60 >60 mL/min   Anion gap 9 5 - 15    Comment: Performed at [facility], [place] [address], [place], [place] [address]  Lipase, blood     Status: None   Collection Time: 10/15/19  5:35 PM  Result Value Ref Range   Lipase 17 11 - 51 U/L    Comment: Performed at [facility], [place] [address], [place], [place] [address]  Urinalysis, Routine w reflex microscopic     Status: Abnormal   Collection Time: 10/15/19  5:35 PM  Result Value Ref Range   Color, Urine YELLOW YELLOW   APPearance CLEAR CLEAR   Specific Gravity, Urine 1.019 1.005 - 1.030   pH 7.0 5.0 -  8.0   Glucose, UA NEGATIVE NEGATIVE mg/dL   Hgb urine dipstick SMALL (A) NEGATIVE   Bilirubin Urine NEGATIVE NEGATIVE   Ketones, ur NEGATIVE NEGATIVE mg/dL   Protein, ur 30 (A) NEGATIVE mg/dL   Nitrite NEGATIVE NEGATIVE   Leukocytes,Ua NEGATIVE NEGATIVE   RBC / HPF 11-20 0 - 5 RBC/hpf   WBC, UA 0-5 0 - 5 WBC/hpf   Bacteria, UA NONE SEEN NONE SEEN   Mucus PRESENT     Comment: Performed at [facility], [address] [place], [place] [address]    Chemistries  Recent Labs  Lab 10/15/19 1735  NA 133*  K 4.2  CL 102  CO2 22  GLUCOSE 113*  BUN 15  CREATININE 0.73  CALCIUM 9.3  AST 84*  ALT 187*  ALKPHOS 219*  BILITOT 1.0   ------------------------------------------------------------------------------------------------------------------  ------------------------------------------------------------------------------------------------------------------ GFR: Estimated Creatinine Clearance: 93.2 mL/min (by  C-G formula based on SCr of 0.73 mg/dL). Liver Function Tests: Recent Labs  Lab 10/15/19 1735  AST 84*  ALT 187*  ALKPHOS 219*  BILITOT 1.0  PROT 7.2  ALBUMIN 3.6   Recent Labs  Lab 10/15/19 1735  LIPASE 17   No results for input(s): AMMONIA in the last 168 hours. Coagulation Profile: No results for input(s): INR, PROTIME in the last 168 hours. Cardiac Enzymes: No results for input(s): CKTOTAL, CKMB, CKMBINDEX, TROPONINI in the last 168 hours. BNP (last 3 results) No results for input(s): PROBNP in the last 8760 hours. HbA1C: No results for input(s): HGBA1C in the last 72 hours. CBG: No results for input(s): GLUCAP in the last 168 hours. Lipid Profile: No results for input(s): CHOL, HDL, LDLCALC, TRIG, CHOLHDL, LDLDIRECT in the last 72 hours. Thyroid Function Tests: No results for input(s): TSH, T4TOTAL, FREET4, T3FREE, THYROIDAB in the last 72 hours. Anemia Panel: No results for input(s): VITAMINB12, FOLATE, FERRITIN, TIBC, IRON,  RETICCTPCT in the last 72 hours.  --------------------------------------------------------------------------------------------------------------- Urine analysis:    Component Value Date/Time   COLORURINE YELLOW 10/15/2019 1735   APPEARANCEUR CLEAR 10/15/2019 1735   LABSPEC 1.019 10/15/2019 1735   LABSPEC 1.015 03/02/2012 1620   PHURINE 7.0 10/15/2019 1735   GLUCOSEU NEGATIVE 10/15/2019 1735   GLUCOSEU NEGATIVE 03/19/2012 1426   HGBUR SMALL (A) 10/15/2019 1735   BILIRUBINUR NEGATIVE 10/15/2019 1735   BILIRUBINUR NEG 08/12/2012 1631   BILIRUBINUR Negative 03/02/2012 1620   KETONESUR NEGATIVE 10/15/2019 1735   PROTEINUR 30 (A) 10/15/2019 1735   UROBILINOGEN 0.2 07/07/2015 2056   NITRITE NEGATIVE 10/15/2019 1735   LEUKOCYTESUR NEGATIVE 10/15/2019 1735   LEUKOCYTESUR Negative 03/02/2012 1620      Imaging Results:    CT Abdomen Pelvis W Contrast  Result Date: 10/15/2019 CLINICAL DATA:  Nausea and vomiting. Abdominal pain. EXAM: CT ABDOMEN AND PELVIS WITH CONTRAST TECHNIQUE: Multidetector CT imaging of the abdomen and pelvis was performed using the standard protocol following bolus administration of intravenous contrast. CONTRAST:  136m OMNIPAQUE IOHEXOL 300 MG/ML  SOLN COMPARISON:  None. 10/25/2014 FINDINGS: Lower chest: Areas of scarring in the right base are stable. There is a new 8 x 8 mm nodular component to some scarring in the medial right lung base (image 14/series 4). Hepatobiliary: No suspicious focal abnormality within the liver parenchyma. There is no evidence for gallstones, gallbladder wall thickening, or pericholecystic fluid. No intrahepatic or extrahepatic biliary dilation. Pancreas: No focal mass lesion. No dilatation of the main duct. No intraparenchymal cyst. No peripancreatic edema. Spleen: No splenomegaly. No focal mass lesion. Adrenals/Urinary Tract: No adrenal nodule or mass. Kidneys unremarkable. No evidence for hydroureter. The urinary bladder appears normal for the  degree of distention. Stomach/Bowel: Stomach is unremarkable. No gastric wall thickening. No evidence of outlet obstruction. Duodenum is normally positioned as is the ligament of Treitz. No small bowel wall thickening. No small bowel dilatation. 7-10 cm segment of terminal ileum shows circumferential wall thickening and edema without substantial perienteric edema or inflammation. Small bowel proximal proximal to this is mildly distended but there appears to be a small bowel anastomosis in this region which could account for the distension Nonvisualization of the appendix is consistent with the reported history of appendectomy. No gross colonic mass. No colonic wall thickening. Vascular/Lymphatic: There is abdominal aortic atherosclerosis without aneurysm. There is no gastrohepatic or hepatoduodenal ligament lymphadenopathy. No retroperitoneal or mesenteric lymphadenopathy. No pelvic sidewall lymphadenopathy. Reproductive: Unremarkable Other: No intraperitoneal free fluid. Musculoskeletal: No worrisome lytic or sclerotic  osseous abnormality. IMPRESSION: 1. 7-10 cm segment of terminal ileum shows circumferential wall thickening and wall edema without substantial perienteric edema or inflammation. Imaging features are compatible with an infectious/inflammatory enteritis. No evidence for perforation or abscess. Abnormal ileum is nonobstructing. 2. New 8 x 8 mm nodular component to some scarring in the medial right lung base. This may be related to scarring or infection/inflammation, but follow-up CT chest without contrast in 3 months is recommended to ensure resolution. 3. Aortic Atherosclerosis (ICD10-I70.0). Electronically Signed   By: Misty Stanley M.D.   On: 10/15/2019 19:44       Assessment & Plan:    Principal Problem:   Enteritis Active Problems:   GERD (gastroesophageal reflux disease)   Hypertension   Abnormal liver function   Leukocytosis   Nausea & vomiting    N/v Zofran 22m iv q6h prn    Enteritis Cipro iv pharmacy to dose, Flagyl 5072miv tid  Leukocytosis Check CXR Check cbc in am  Abnormal liver function Check acute hepatitis panel Check cmp in am  Gerd pepcid 2058mv bid  Pulmonary nodule Pt will require f/u CT chest with contrast in 3 months per radiology  Microscopic hematuria Please arrange outpatient urology follow up  Hypertension uncontrolled Hydralazine 15m49m q6h prn sbp>160     DVT Prophylaxis-   Lovenox - SCDs  AM Labs Ordered, also please review Full Orders  Family Communication: Admission, patients condition and plan of care including tests being ordered have been discussed with the patient  who indicate understanding and agree with the plan and Code Status.  Code Status:  FULL CODE per patient, attempted to contact daughter, no response.   Admission status: Observation: Based on patients clinical presentation and evaluation of above clinical data, I have made determination that patient meets observation criteria at this time.     Time spent in minutes :  70 minutes    JameJani Gravel on 10/15/2019 at 8:52 PM

## 2019-10-15 NOTE — Progress Notes (Signed)
Pharmacy Antibiotic Note  Martin [name] is a 59 y.o. male admitted on 10/15/2019 with intra-abdominal infection.  Pharmacy has been consulted for Cipro dosing.   Plan: Cipro 43m IV q12h No further dose adjustments anticipated.  Pharmacy to sign off & monitor peripherally. Please reconsult if needed.   Height: 5' 10.5" (179.1 cm) Weight: 148 lb (67.1 kg) IBW/kg (Calculated) : 74.15  Temp (24hrs), Avg:98.1 F (36.7 C), Min:98.1 F (36.7 C), Max:98.1 F (36.7 C)  Recent Labs  Lab 10/15/19 1735  WBC 21.1*  CREATININE 0.73    Estimated Creatinine Clearance: 93.2 mL/min (by C-G formula based on SCr of 0.73 mg/dL).    Allergies  Allergen Reactions  . Yellow Jacket Venom [Bee Venom] Anaphylaxis  . Aspirin Swelling  . Codeine Nausea Only  . Morphine And Related Itching    Thank you for allowing pharmacy to be a part of this patient's care.  MNetta Cedars PharmD, BCPS 10/15/2019 9:34 PM

## 2019-10-16 ENCOUNTER — Other Ambulatory Visit: Payer: Self-pay

## 2019-10-16 ENCOUNTER — Encounter (HOSPITAL_COMMUNITY): Payer: Self-pay | Admitting: Internal Medicine

## 2019-10-16 DIAGNOSIS — R945 Abnormal results of liver function studies: Secondary | ICD-10-CM | POA: Diagnosis not present

## 2019-10-16 DIAGNOSIS — Z85048 Personal history of other malignant neoplasm of rectum, rectosigmoid junction, and anus: Secondary | ICD-10-CM | POA: Diagnosis not present

## 2019-10-16 DIAGNOSIS — I700 Atherosclerosis of aorta: Secondary | ICD-10-CM | POA: Diagnosis present

## 2019-10-16 DIAGNOSIS — R7401 Elevation of levels of liver transaminase levels: Secondary | ICD-10-CM | POA: Diagnosis present

## 2019-10-16 DIAGNOSIS — K219 Gastro-esophageal reflux disease without esophagitis: Secondary | ICD-10-CM | POA: Diagnosis present

## 2019-10-16 DIAGNOSIS — K648 Other hemorrhoids: Secondary | ICD-10-CM | POA: Diagnosis present

## 2019-10-16 DIAGNOSIS — Z800 Family history of malignant neoplasm of digestive organs: Secondary | ICD-10-CM | POA: Diagnosis not present

## 2019-10-16 DIAGNOSIS — M170 Bilateral primary osteoarthritis of knee: Secondary | ICD-10-CM | POA: Diagnosis present

## 2019-10-16 DIAGNOSIS — F1010 Alcohol abuse, uncomplicated: Secondary | ICD-10-CM | POA: Diagnosis present

## 2019-10-16 DIAGNOSIS — F19231 Other psychoactive substance dependence with withdrawal delirium: Secondary | ICD-10-CM | POA: Diagnosis present

## 2019-10-16 DIAGNOSIS — M5136 Other intervertebral disc degeneration, lumbar region: Secondary | ICD-10-CM | POA: Diagnosis present

## 2019-10-16 DIAGNOSIS — Z20822 Contact with and (suspected) exposure to covid-19: Secondary | ICD-10-CM | POA: Diagnosis present

## 2019-10-16 DIAGNOSIS — R112 Nausea with vomiting, unspecified: Secondary | ICD-10-CM | POA: Diagnosis present

## 2019-10-16 DIAGNOSIS — Z825 Family history of asthma and other chronic lower respiratory diseases: Secondary | ICD-10-CM | POA: Diagnosis not present

## 2019-10-16 DIAGNOSIS — Z888 Allergy status to other drugs, medicaments and biological substances status: Secondary | ICD-10-CM | POA: Diagnosis not present

## 2019-10-16 DIAGNOSIS — D72829 Elevated white blood cell count, unspecified: Secondary | ICD-10-CM | POA: Diagnosis not present

## 2019-10-16 DIAGNOSIS — R911 Solitary pulmonary nodule: Secondary | ICD-10-CM | POA: Diagnosis present

## 2019-10-16 DIAGNOSIS — Z8249 Family history of ischemic heart disease and other diseases of the circulatory system: Secondary | ICD-10-CM | POA: Diagnosis not present

## 2019-10-16 DIAGNOSIS — E876 Hypokalemia: Secondary | ICD-10-CM | POA: Diagnosis present

## 2019-10-16 DIAGNOSIS — R451 Restlessness and agitation: Secondary | ICD-10-CM | POA: Diagnosis not present

## 2019-10-16 DIAGNOSIS — K529 Noninfective gastroenteritis and colitis, unspecified: Secondary | ICD-10-CM | POA: Diagnosis present

## 2019-10-16 DIAGNOSIS — Z885 Allergy status to narcotic agent status: Secondary | ICD-10-CM | POA: Diagnosis not present

## 2019-10-16 DIAGNOSIS — F17210 Nicotine dependence, cigarettes, uncomplicated: Secondary | ICD-10-CM | POA: Diagnosis present

## 2019-10-16 DIAGNOSIS — R3129 Other microscopic hematuria: Secondary | ICD-10-CM | POA: Diagnosis present

## 2019-10-16 DIAGNOSIS — I10 Essential (primary) hypertension: Secondary | ICD-10-CM | POA: Diagnosis present

## 2019-10-16 DIAGNOSIS — F419 Anxiety disorder, unspecified: Secondary | ICD-10-CM | POA: Diagnosis present

## 2019-10-16 LAB — CBC
HCT: 44.6 % (ref 39.0–52.0)
HCT: 45.4 % (ref 39.0–52.0)
Hemoglobin: 15.5 g/dL (ref 13.0–17.0)
Hemoglobin: 16 g/dL (ref 13.0–17.0)
MCH: 31.1 pg (ref 26.0–34.0)
MCH: 31.6 pg (ref 26.0–34.0)
MCHC: 34.8 g/dL (ref 30.0–36.0)
MCHC: 35.2 g/dL (ref 30.0–36.0)
MCV: 89.5 fL (ref 80.0–100.0)
MCV: 89.6 fL (ref 80.0–100.0)
Platelets: 176 10*3/uL (ref 150–400)
Platelets: 184 10*3/uL (ref 150–400)
RBC: 4.98 MIL/uL (ref 4.22–5.81)
RBC: 5.07 MIL/uL (ref 4.22–5.81)
RDW: 14 % (ref 11.5–15.5)
RDW: 14.1 % (ref 11.5–15.5)
WBC: 20.4 10*3/uL — ABNORMAL HIGH (ref 4.0–10.5)
WBC: 20.5 10*3/uL — ABNORMAL HIGH (ref 4.0–10.5)
nRBC: 0 % (ref 0.0–0.2)
nRBC: 0 % (ref 0.0–0.2)

## 2019-10-16 LAB — HIV ANTIBODY (ROUTINE TESTING W REFLEX): HIV Screen 4th Generation wRfx: NONREACTIVE

## 2019-10-16 LAB — COMPREHENSIVE METABOLIC PANEL
ALT: 164 U/L — ABNORMAL HIGH (ref 0–44)
AST: 62 U/L — ABNORMAL HIGH (ref 15–41)
Albumin: 3.6 g/dL (ref 3.5–5.0)
Alkaline Phosphatase: 233 U/L — ABNORMAL HIGH (ref 38–126)
Anion gap: 11 (ref 5–15)
BUN: 12 mg/dL (ref 6–20)
CO2: 20 mmol/L — ABNORMAL LOW (ref 22–32)
Calcium: 9.2 mg/dL (ref 8.9–10.3)
Chloride: 104 mmol/L (ref 98–111)
Creatinine, Ser: 0.79 mg/dL (ref 0.61–1.24)
GFR calc Af Amer: >60 mL/min (ref >60)
GFR calc non Af Amer: >60 mL/min (ref >60)
Glucose, Bld: 107 mg/dL — ABNORMAL HIGH (ref 70–99)
Potassium: 3.3 mmol/L — ABNORMAL LOW (ref 3.5–5.1)
Sodium: 135 mmol/L (ref 135–145)
Total Bilirubin: 1.4 mg/dL — ABNORMAL HIGH (ref 0.3–1.2)
Total Protein: 7.7 g/dL (ref 6.5–8.1)

## 2019-10-16 LAB — MAGNESIUM: Magnesium: 2 mg/dL (ref 1.7–2.4)

## 2019-10-16 LAB — PHOSPHORUS: Phosphorus: 2.8 mg/dL (ref 2.5–4.6)

## 2019-10-16 LAB — HEPATITIS PANEL, ACUTE
HCV Ab: NONREACTIVE
Hep A IgM: NONREACTIVE
Hep B C IgM: NONREACTIVE
Hepatitis B Surface Ag: NONREACTIVE

## 2019-10-16 LAB — PROCALCITONIN: Procalcitonin: 8.73 ng/mL

## 2019-10-16 LAB — AMMONIA: Ammonia: 25 umol/L (ref 9–35)

## 2019-10-16 MED ORDER — THIAMINE HCL 100 MG PO TABS
100.0000 mg | ORAL_TABLET | Freq: Every day | ORAL | Status: DC
  Administered 2019-10-16 – 2019-10-18 (×2): 100 mg via ORAL
  Filled 2019-10-16 (×2): qty 1

## 2019-10-16 MED ORDER — LABETALOL HCL 5 MG/ML IV SOLN
10.0000 mg | INTRAVENOUS | Status: DC | PRN
  Administered 2019-10-16 – 2019-10-17 (×4): 10 mg via INTRAVENOUS
  Filled 2019-10-16 (×4): qty 4

## 2019-10-16 MED ORDER — LORAZEPAM 2 MG/ML IJ SOLN
1.0000 mg | Freq: Once | INTRAMUSCULAR | Status: AC
  Administered 2019-10-16: 1 mg via INTRAVENOUS
  Filled 2019-10-16: qty 1

## 2019-10-16 MED ORDER — TRAMADOL HCL 50 MG PO TABS
50.0000 mg | ORAL_TABLET | Freq: Once | ORAL | Status: AC
  Administered 2019-10-16: 02:00:00 50 mg via ORAL
  Filled 2019-10-16: qty 1

## 2019-10-16 MED ORDER — LORAZEPAM 1 MG PO TABS: 1.0000 mg | ORAL_TABLET | ORAL | Status: DC | PRN

## 2019-10-16 MED ORDER — THIAMINE HCL 100 MG/ML IJ SOLN
100.0000 mg | Freq: Every day | INTRAMUSCULAR | Status: DC
  Administered 2019-10-17: 100 mg via INTRAVENOUS
  Filled 2019-10-16: qty 2

## 2019-10-16 MED ORDER — POTASSIUM CHLORIDE CRYS ER 20 MEQ PO TBCR
40.0000 meq | EXTENDED_RELEASE_TABLET | Freq: Once | ORAL | Status: AC
  Administered 2019-10-16: 13:00:00 40 meq via ORAL
  Filled 2019-10-16: qty 2

## 2019-10-16 MED ORDER — FOLIC ACID 1 MG PO TABS
1.0000 mg | ORAL_TABLET | Freq: Every day | ORAL | Status: DC
  Administered 2019-10-16 – 2019-10-18 (×2): 1 mg via ORAL
  Filled 2019-10-16 (×2): qty 1

## 2019-10-16 MED ORDER — DEXMEDETOMIDINE HCL IN NACL 200 MCG/50ML IV SOLN
0.4000 ug/kg/h | INTRAVENOUS | Status: DC
  Administered 2019-10-16: 0.4 ug/kg/h via INTRAVENOUS
  Administered 2019-10-17: 19:00:00 0.8 ug/kg/h via INTRAVENOUS
  Administered 2019-10-17: 1 ug/kg/h via INTRAVENOUS
  Administered 2019-10-17: 10:00:00 0.8 ug/kg/h via INTRAVENOUS
  Administered 2019-10-17 – 2019-10-18 (×2): 0.9 ug/kg/h via INTRAVENOUS
  Administered 2019-10-18 (×2): 0.5 ug/kg/h via INTRAVENOUS
  Filled 2019-10-16 (×10): qty 50

## 2019-10-16 MED ORDER — SODIUM CHLORIDE 0.9 % IV SOLN: INTRAVENOUS | Status: AC

## 2019-10-16 MED ORDER — AMLODIPINE BESYLATE 5 MG PO TABS
5.0000 mg | ORAL_TABLET | Freq: Every day | ORAL | Status: DC
  Administered 2019-10-16: 5 mg via ORAL
  Filled 2019-10-16: qty 1

## 2019-10-16 MED ORDER — CHLORHEXIDINE GLUCONATE CLOTH 2 % EX PADS
6.0000 | MEDICATED_PAD | Freq: Every day | CUTANEOUS | Status: DC
  Administered 2019-10-16 – 2019-10-18 (×3): 6 via TOPICAL

## 2019-10-16 MED ORDER — ADULT MULTIVITAMIN W/MINERALS CH
1.0000 | ORAL_TABLET | Freq: Every day | ORAL | Status: DC
  Administered 2019-10-16 – 2019-10-18 (×2): 1 via ORAL
  Filled 2019-10-16 (×2): qty 1

## 2019-10-16 MED ORDER — HYDROCODONE-ACETAMINOPHEN 5-325 MG PO TABS
1.0000 | ORAL_TABLET | ORAL | Status: DC | PRN
  Administered 2019-10-16 – 2019-10-17 (×2): 1 via ORAL
  Filled 2019-10-16 (×2): qty 1

## 2019-10-16 MED ORDER — LORAZEPAM 2 MG/ML IJ SOLN
1.0000 mg | INTRAMUSCULAR | Status: DC | PRN
  Administered 2019-10-16 (×3): 2 mg via INTRAVENOUS
  Administered 2019-10-16: 1 mg via INTRAVENOUS
  Administered 2019-10-16 (×2): 3 mg via INTRAVENOUS
  Administered 2019-10-17 – 2019-10-18 (×3): 2 mg via INTRAVENOUS
  Filled 2019-10-16 (×3): qty 1
  Filled 2019-10-16 (×2): qty 2
  Filled 2019-10-16 (×5): qty 1

## 2019-10-16 NOTE — Progress Notes (Signed)
MEWS/VS Documentation      10/16/2019 1637 10/16/2019 1702 10/16/2019 1721 10/16/2019 1722   MEWS Score:  0  3  3  3    MEWS Score Color:  Green  Yellow  Yellow  Yellow   Resp:  20  (!) 38  --  (!) 36   Pulse:  98  91  92  --   BP:  (!) 174/90  (!) 172/79  (!) 163/77  --   Temp:  98 F (36.7 C)  --  --  --   O2 Device:  Room [employer]  Room Air  --     Pt mews turned yellow. Respirations and blood pressure elevated. MD aware. CIWA elevated and ativan given.

## 2019-10-16 NOTE — Progress Notes (Signed)
PROGRESS NOTE  Martin [name] ALP:[id number] DOB: [birth year] DOA: 10/15/2019 PCP: Patient, No Pcp Per  HPI/Recap of past 24 hours: HPI from Dr Rico Sheehan  is a 59 y.o. male,  w h/o anal cancer, gerd,  anxiety, hypertension, presents with c/o n/v x 2days. Pt denies fever, chills, cough, cp, palp, abd pain, diarrhea, brbpr, black stool. In the ED,  Pt afebrile, saturating well on room air, BP noted to be elevated at 221/90, labs showed WBC 21.1, hemoglobin 14.2, AST 84, ALT 187, T bili 1, urinalysis showed RBC 11-20, WBC 0-5, CT abdomen/pelvis showed 7 to 10 cm segment of terminal ileum with circumferential wall thickening and wall edema compatible with infectious/inflammatory enteritis. Patient admitted for further management.     Overnight, patient appears to be very restless, with increased agitation.  Reports a significant amount of alcohol consumption.  This a.m., saw patient shortly after receiving Ativan.  Drowsy, unable to engage in a meaningful conversation.  Easily arousable.       Assessment/Plan: Principal Problem:   Enteritis Active Problems:   GERD (gastroesophageal reflux disease)   Hypertension   Abnormal liver function   Leukocytosis   Nausea & vomiting  Acute enteritis Currently afebrile, with leukocytosis Lipase 17, mild transaminitis BC x2 pending collection Procalcitonin pending CT abdomen/pelvis showed 7 to 10 cm segment of terminal ileum with circumferential wall thickening and wall edema compatible with infectious/inflammatory enteritis.  Continue ciprofloxacin, Flagyl IV hydration Monitor closely  Transaminitis CT abdomen/pelvis unremarkable for any hepatic pathology Hepatitis panel pending Daily CMP  Hypokalemia Replace as needed  Hypertension Uncontrolled Continue IV hydralazine as needed, started on amlodipine 5 mg daily  GERD Continue Pepcid  Pulmonary nodule Follow-up CT chest with contrast in 3 months  ??Alcohol abuse ?? In  withdrawal Alcohol, UDS pending CIWA protocol        Malnutrition Type:      Malnutrition Characteristics:      Nutrition Interventions:       Estimated body mass index is 18.98 kg/m as calculated from the following:   Height as of this encounter: 5' 10.5" (1.791 m).   Weight as of this encounter: 60.9 kg.     Code Status: Full  Family Communication: None at bedside  Disposition Plan: To be determined, likely home, once significant clinical improvement   Consultants:  None  Procedures:  None  Antimicrobials:  Ciprofloxacin  Flagyl  DVT prophylaxis: Lovenox   Objective: Vitals:   10/16/19 0553 10/16/19 0818 10/16/19 1038 10/16/19 1209  BP: (!) 177/92 (!) 179/85 (!) 174/95 (!) 185/88  Pulse: 100 96 98 93  Resp: (!) 24     Temp: 97.8 F (36.6 C) 98.8 F (37.1 C)    TempSrc: Oral Oral    SpO2: 99% 99%    Weight:      Height:        Intake/Output Summary (Last 24 hours) at 10/16/2019 1251 Last data filed at 10/16/2019 1208 Gross per 24 hour  Intake 1696.67 ml  Output 1501 ml  Net 195.67 ml   Filed Weights   10/15/19 1701 10/16/19 0500  Weight: 67.1 kg 60.9 kg    Exam:  General: NAD, drowsy  Cardiovascular: S1, S2 present  Respiratory: CTAB  Abdomen: Soft, nontender, nondistended, bowel sounds present  Musculoskeletal: No bilateral pedal edema noted  Skin: Normal  Psychiatry: Normal mood   Data Reviewed: CBC: Recent Labs  Lab 10/15/19 1735 10/16/19 0426  WBC 21.1* 20.5*  20.4*  NEUTROABS 18.8*  --  HGB 14.2 15.5  16.0  HCT 41.4 44.6  45.4  MCV 91.0 89.6  89.5  PLT 153 184  720   Basic Metabolic Panel: Recent Labs  Lab 10/15/19 1735 10/16/19 0426  NA 133* 135  K 4.2 3.3*  CL 102 104  CO2 22 20*  GLUCOSE 113* 107*  BUN 15 12  CREATININE 0.73 0.79  CALCIUM 9.3 9.2  MG  --  2.0  PHOS  --  2.8   GFR: Estimated Creatinine Clearance: 84.6 mL/min (by C-G formula based on SCr of 0.79 mg/dL). Liver  Function Tests: Recent Labs  Lab 10/15/19 1735 10/16/19 0426  AST 84* 62*  ALT 187* 164*  ALKPHOS 219* 233*  BILITOT 1.0 1.4*  PROT 7.2 7.7  ALBUMIN 3.6 3.6   Recent Labs  Lab 10/15/19 1735  LIPASE 17   Recent Labs  Lab 10/16/19 0426  AMMONIA 25   Coagulation Profile: No results for input(s): INR, PROTIME in the last 168 hours. Cardiac Enzymes: No results for input(s): CKTOTAL, CKMB, CKMBINDEX, TROPONINI in the last 168 hours. BNP (last 3 results) No results for input(s): PROBNP in the last 8760 hours. HbA1C: No results for input(s): HGBA1C in the last 72 hours. CBG: No results for input(s): GLUCAP in the last 168 hours. Lipid Profile: No results for input(s): CHOL, HDL, LDLCALC, TRIG, CHOLHDL, LDLDIRECT in the last 72 hours. Thyroid Function Tests: No results for input(s): TSH, T4TOTAL, FREET4, T3FREE, THYROIDAB in the last 72 hours. Anemia Panel: No results for input(s): VITAMINB12, FOLATE, FERRITIN, TIBC, IRON, RETICCTPCT in the last 72 hours. Urine analysis:    Component Value Date/Time   COLORURINE YELLOW 10/15/2019 1735   APPEARANCEUR CLEAR 10/15/2019 1735   LABSPEC 1.019 10/15/2019 1735   LABSPEC 1.015 03/02/2012 1620   PHURINE 7.0 10/15/2019 1735   GLUCOSEU NEGATIVE 10/15/2019 1735   GLUCOSEU NEGATIVE 03/19/2012 1426   HGBUR SMALL (A) 10/15/2019 1735   BILIRUBINUR NEGATIVE 10/15/2019 1735   BILIRUBINUR NEG 08/12/2012 1631   BILIRUBINUR Negative 03/02/2012 1620   KETONESUR NEGATIVE 10/15/2019 1735   PROTEINUR 30 (A) 10/15/2019 1735   UROBILINOGEN 0.2 07/07/2015 2056   NITRITE NEGATIVE 10/15/2019 1735   LEUKOCYTESUR NEGATIVE 10/15/2019 1735   LEUKOCYTESUR Negative 03/02/2012 1620   Sepsis Labs: @LABRCNTIP (procalcitonin:4,lacticidven:4)  ) Recent Results (from the past 240 hour(s))  Respiratory Panel by RT PCR (Flu A&B, Covid) - Nasopharyngeal Swab     Status: None   Collection Time: 10/15/19  8:31 PM   Specimen: Nasopharyngeal Swab  Result  Value Ref Range Status   SARS Coronavirus 2 by RT PCR NEGATIVE NEGATIVE Final    Comment: (NOTE) SARS-CoV-2 target nucleic acids are NOT DETECTED. The SARS-CoV-2 RNA is generally detectable in upper respiratoy specimens during the acute phase of infection. The lowest concentration of SARS-CoV-2 viral copies this assay can detect is 131 copies/mL. A negative result does not preclude SARS-Cov-2 infection and should not be used as the sole basis for treatment or other patient management decisions. A negative result may occur with  improper specimen collection/handling, submission of specimen other than nasopharyngeal swab, presence of viral mutation(s) within the areas targeted by this assay, and inadequate number of viral copies (<131 copies/mL). A negative result must be combined with clinical observations, patient history, and epidemiological information. The expected result is Negative. Fact Sheet for Patients:  PinkCheek.be Fact Sheet for Healthcare Providers:  GravelBags.it This test is not yet ap proved or cleared by the [place] and  has been authorized  for detection and/or diagnosis of SARS-CoV-2 by FDA under an Emergency Use Authorization (EUA). This EUA will remain  in effect (meaning this test can be used) for the duration of the COVID-19 declaration under Section 564(b)(1) of the Act, 21 U.S.C. section 360bbb-3(b)(1), unless the authorization is terminated or revoked sooner.    Influenza A by PCR NEGATIVE NEGATIVE Final   Influenza B by PCR NEGATIVE NEGATIVE Final    Comment: (NOTE) The Xpert Xpress SARS-CoV-2/FLU/RSV assay is intended as an aid in  the diagnosis of influenza from Nasopharyngeal swab specimens and  should not be used as a sole basis for treatment. Nasal washings and  aspirates are unacceptable for Xpert Xpress SARS-CoV-2/FLU/RSV  testing. Fact Sheet for  Patients: PinkCheek.be Fact Sheet for Healthcare Providers: GravelBags.it This test is not yet approved or cleared by the [place] FDA and  has been authorized for detection and/or diagnosis of SARS-CoV-2 by  FDA under an Emergency Use Authorization (EUA). This EUA will remain  in effect (meaning this test can be used) for the duration of the  Covid-19 declaration under Section 564(b)(1) of the Act, 21  U.S.C. section 360bbb-3(b)(1), unless the authorization is  terminated or revoked. Performed at [facility], [place] [address] [place], [place] [address]       Studies: CT Abdomen Pelvis W Contrast  Result Date: 10/15/2019 CLINICAL DATA:  Nausea and vomiting. Abdominal pain. EXAM: CT ABDOMEN AND PELVIS WITH CONTRAST TECHNIQUE: Multidetector CT imaging of the abdomen and pelvis was performed using the standard protocol following bolus administration of intravenous contrast. CONTRAST:  12m OMNIPAQUE IOHEXOL 300 MG/ML  SOLN COMPARISON:  None. 10/25/2014 FINDINGS: Lower chest: Areas of scarring in the right base are stable. There is a new 8 x 8 mm nodular component to some scarring in the medial right lung base (image 14/series 4). Hepatobiliary: No suspicious focal abnormality within the liver parenchyma. There is no evidence for gallstones, gallbladder wall thickening, or pericholecystic fluid. No intrahepatic or extrahepatic biliary dilation. Pancreas: No focal mass lesion. No dilatation of the main duct. No intraparenchymal cyst. No peripancreatic edema. Spleen: No splenomegaly. No focal mass lesion. Adrenals/Urinary Tract: No adrenal nodule or mass. Kidneys unremarkable. No evidence for hydroureter. The urinary bladder appears normal for the degree of distention. Stomach/Bowel: Stomach is unremarkable. No gastric wall thickening. No evidence of outlet obstruction. Duodenum is normally positioned as is the ligament of  Treitz. No small bowel wall thickening. No small bowel dilatation. 7-10 cm segment of terminal ileum shows circumferential wall thickening and edema without substantial perienteric edema or inflammation. Small bowel proximal proximal to this is mildly distended but there appears to be a small bowel anastomosis in this region which could account for the distension Nonvisualization of the appendix is consistent with the reported history of appendectomy. No gross colonic mass. No colonic wall thickening. Vascular/Lymphatic: There is abdominal aortic atherosclerosis without aneurysm. There is no gastrohepatic or hepatoduodenal ligament lymphadenopathy. No retroperitoneal or mesenteric lymphadenopathy. No pelvic sidewall lymphadenopathy. Reproductive: Unremarkable Other: No intraperitoneal free fluid. Musculoskeletal: No worrisome lytic or sclerotic osseous abnormality. IMPRESSION: 1. 7-10 cm segment of terminal ileum shows circumferential wall thickening and wall edema without substantial perienteric edema or inflammation. Imaging features are compatible with an infectious/inflammatory enteritis. No evidence for perforation or abscess. Abnormal ileum is nonobstructing. 2. New 8 x 8 mm nodular component to some scarring in the medial right lung base. This may be related to scarring or infection/inflammation, but follow-up CT chest without contrast  in 3 months is recommended to ensure resolution. 3. Aortic Atherosclerosis (ICD10-I70.0). Electronically Signed   By: Misty Stanley M.D.   On: 10/15/2019 19:44    Scheduled Meds: . amLODipine  5 mg Oral Daily  . enoxaparin (LOVENOX) injection  40 mg Subcutaneous Q24H  . folic acid  1 mg Oral Daily  . multivitamin with minerals  1 tablet Oral Daily  . thiamine  100 mg Oral Daily   Or  . thiamine  100 mg Intravenous Daily    Continuous Infusions: . ciprofloxacin 400 mg (10/16/19 0157)  . famotidine (PEPCID) IV 20 mg (10/16/19 0830)  . metronidazole 500 mg  (10/16/19 0548)     LOS: 0 days     Alma Friendly, MD [facility]  If 7PM-7AM, please contact night-coverage [email or web address] 10/16/2019, 12:51 PM

## 2019-10-16 NOTE — Progress Notes (Signed)
The pt appears to have teeth missing.

## 2019-10-16 NOTE — Progress Notes (Signed)
Made NP Blount  awear that the pt shared that he drinks beer three times a week, when he is at home. Although a particular amount was not specified, he does appear to be very restless, with increase agitation  noted.  Orders given for labs and urine drug screen. I will continue to monitor.

## 2019-10-17 ENCOUNTER — Inpatient Hospital Stay (HOSPITAL_COMMUNITY): Payer: Medicaid Other

## 2019-10-17 LAB — RAPID URINE DRUG SCREEN, HOSP PERFORMED
Amphetamines: NOT DETECTED
Barbiturates: NOT DETECTED
Benzodiazepines: POSITIVE — AB
Cocaine: NOT DETECTED
Opiates: POSITIVE — AB
Tetrahydrocannabinol: NOT DETECTED

## 2019-10-17 LAB — COMPREHENSIVE METABOLIC PANEL
ALT: 145 U/L — ABNORMAL HIGH (ref 0–44)
AST: 73 U/L — ABNORMAL HIGH (ref 15–41)
Albumin: 3.6 g/dL (ref 3.5–5.0)
Alkaline Phosphatase: 220 U/L — ABNORMAL HIGH (ref 38–126)
Anion gap: 11 (ref 5–15)
BUN: 15 mg/dL (ref 6–20)
CO2: 19 mmol/L — ABNORMAL LOW (ref 22–32)
Calcium: 9.2 mg/dL (ref 8.9–10.3)
Chloride: 104 mmol/L (ref 98–111)
Creatinine, Ser: 0.77 mg/dL (ref 0.61–1.24)
GFR calc Af Amer: >60 mL/min (ref >60)
GFR calc non Af Amer: >60 mL/min (ref >60)
Glucose, Bld: 140 mg/dL — ABNORMAL HIGH (ref 70–99)
Potassium: 3.2 mmol/L — ABNORMAL LOW (ref 3.5–5.1)
Sodium: 134 mmol/L — ABNORMAL LOW (ref 135–145)
Total Bilirubin: 1.1 mg/dL (ref 0.3–1.2)
Total Protein: 7.6 g/dL (ref 6.5–8.1)

## 2019-10-17 LAB — MRSA PCR SCREENING: MRSA by PCR: NEGATIVE

## 2019-10-17 LAB — CBC WITH DIFFERENTIAL/PLATELET
Abs Immature Granulocytes: 0.12 10*3/uL — ABNORMAL HIGH (ref 0.00–0.07)
Basophils Absolute: 0.1 10*3/uL (ref 0.0–0.1)
Basophils Relative: 1 %
Eosinophils Absolute: 0 10*3/uL (ref 0.0–0.5)
Eosinophils Relative: 0 %
HCT: 49.3 % (ref 39.0–52.0)
Hemoglobin: 16.9 g/dL (ref 13.0–17.0)
Immature Granulocytes: 1 %
Lymphocytes Relative: 10 %
Lymphs Abs: 1.8 10*3/uL (ref 0.7–4.0)
MCH: 30.9 pg (ref 26.0–34.0)
MCHC: 34.3 g/dL (ref 30.0–36.0)
MCV: 90.1 fL (ref 80.0–100.0)
Monocytes Absolute: 0.8 10*3/uL (ref 0.1–1.0)
Monocytes Relative: 4 %
Neutro Abs: 15.1 10*3/uL — ABNORMAL HIGH (ref 1.7–7.7)
Neutrophils Relative %: 84 %
Platelets: 213 10*3/uL (ref 150–400)
RBC: 5.47 MIL/uL (ref 4.22–5.81)
RDW: 14.1 % (ref 11.5–15.5)
WBC: 17.8 10*3/uL — ABNORMAL HIGH (ref 4.0–10.5)
nRBC: 0 % (ref 0.0–0.2)

## 2019-10-17 LAB — ETHANOL: Alcohol, Ethyl (B): <10 mg/dL (ref <10)

## 2019-10-17 LAB — PROCALCITONIN: Procalcitonin: 7.2 ng/mL

## 2019-10-17 MED ORDER — POTASSIUM CHLORIDE 10 MEQ/100ML IV SOLN
10.0000 meq | INTRAVENOUS | Status: AC
  Administered 2019-10-17 (×3): 10 meq via INTRAVENOUS
  Filled 2019-10-17 (×3): qty 100

## 2019-10-17 MED ORDER — FUROSEMIDE 10 MG/ML IJ SOLN
40.0000 mg | Freq: Once | INTRAMUSCULAR | Status: AC
  Administered 2019-10-17: 03:00:00 40 mg via INTRAVENOUS
  Filled 2019-10-17: qty 4

## 2019-10-17 MED ORDER — POTASSIUM CHLORIDE 20 MEQ PO PACK: 40.0000 meq | PACK | Freq: Once | ORAL | Status: DC

## 2019-10-17 NOTE — Progress Notes (Signed)
Dr Horris Latino notified of pressures running in the 80s; most likely due to current precedex gtt. Patient comfortable and with no other complaints at this time. Will continue to monitor.

## 2019-10-17 NOTE — Progress Notes (Signed)
PROGRESS NOTE  Martin [name]:[id number] DOB: 11-02-1960 DOA: 10/15/2019 PCP: Patient, No Pcp Per  HPI/Recap of past 24 hours: HPI from Dr Rico Sheehan  is a 59 y.o. male,  w h/o anal cancer, gerd,  anxiety, hypertension, presents with c/o n/v x 2days. Pt denies fever, chills, cough, cp, palp, abd pain, diarrhea, brbpr, black stool. In the ED,  Pt afebrile, saturating well on room air, BP noted to be elevated at 221/90, labs showed WBC 21.1, hemoglobin 14.2, AST 84, ALT 187, T bili 1, urinalysis showed RBC 11-20, WBC 0-5, CT abdomen/pelvis showed 7 to 10 cm segment of terminal ileum with circumferential wall thickening and wall edema compatible with infectious/inflammatory enteritis. Patient admitted for further management.     Overnight, patient required Precedex due to significant delirium/withdrawal.  Currently sedated, with noted episodes of hypotension likely due to Precedex infusion.  Looks comfortable.     Assessment/Plan: Principal Problem:   Enteritis Active Problems:   GERD (gastroesophageal reflux disease)   Hypertension   Abnormal liver function   Leukocytosis   Nausea & vomiting   Acute enteritis Currently afebrile, with leukocytosis Lipase 17, mild transaminitis BC x2 pending collection Procalcitonin 8.73--> 7.2 CT abdomen/pelvis showed 7 to 10 cm segment of terminal ileum with circumferential wall thickening and wall edema compatible with infectious/inflammatory enteritis.  Continue ciprofloxacin, Flagyl IV hydration Monitor closely  Transaminitis CT abdomen/pelvis unremarkable for any hepatic pathology Hepatitis panel nonreactive Daily CMP  Hypokalemia Replace as needed  ??Alcohol abuse/polysubstance abuse ?? In withdrawal/delirious Alcohol unremarkable, UDS positive for benzos, opiates (although ordered late) Currently on Precedex, due to significant delirium/withdrawal CIWA protocol Continue to monitor in SDU  Hypertension BP now soft  (likely 2/2 Precedex) Continue IV hydralazine as needed, hold recently started amlodipine 5 mg daily  GERD Continue Pepcid  Pulmonary nodule Follow-up CT chest with contrast in 3 months        Malnutrition Type:      Malnutrition Characteristics:      Nutrition Interventions:       Estimated body mass index is 18.92 kg/m as calculated from the following:   Height as of this encounter: 5' 10"  (1.778 m).   Weight as of this encounter: 59.8 kg.     Code Status: Full  Family Communication: None at bedside  Disposition Plan: To be determined, likely home, once significant clinical improvement   Consultants:  None  Procedures:  None  Antimicrobials:  Ciprofloxacin  Flagyl  DVT prophylaxis: Lovenox   Objective: Vitals:   10/17/19 1305 10/17/19 1400 10/17/19 1500 10/17/19 1600  BP: 128/70 (!) 102/54 (!) 111/57 120/67  Pulse: 90 84 89 88  Resp: (!) 28 (!) 33 (!) 38 (!) 38  Temp:    99 F (37.2 C)  TempSrc:    Axillary  SpO2: 97% 96% 96% 97%  Weight:      Height:        Intake/Output Summary (Last 24 hours) at 10/17/2019 1720 Last data filed at 10/17/2019 1500 Gross per 24 hour  Intake 2367.29 ml  Output 900 ml  Net 1467.29 ml   Filed Weights   10/16/19 0500 10/16/19 1900 10/17/19 0459  Weight: 60.9 kg 62.1 kg 59.8 kg    Exam:  General: NAD, sedated  Cardiovascular: S1, S2 present  Respiratory: CTAB  Abdomen: Soft, nontender, nondistended, bowel sounds present  Musculoskeletal: No bilateral pedal edema noted  Skin: Normal  Psychiatry:  Unable to assess   Data Reviewed: CBC: Recent Labs  Lab 10/15/19 1735 10/16/19 0426 10/17/19 0318  WBC 21.1* 20.5*  20.4* 17.8*  NEUTROABS 18.8*  --  15.1*  HGB 14.2 15.5  16.0 16.9  HCT 41.4 44.6  45.4 49.3  MCV 91.0 89.6  89.5 90.1  PLT 153 184  176 174   Basic Metabolic Panel: Recent Labs  Lab 10/15/19 1735 10/16/19 0426 10/17/19 0318  NA 133* 135 134*  K 4.2 3.3* 3.2*   CL 102 104 104  CO2 22 20* 19*  GLUCOSE 113* 107* 140*  BUN 15 12 15   CREATININE 0.73 0.79 0.77  CALCIUM 9.3 9.2 9.2  MG  --  2.0  --   PHOS  --  2.8  --    GFR: Estimated Creatinine Clearance: 83.1 mL/min (by C-G formula based on SCr of 0.77 mg/dL). Liver Function Tests: Recent Labs  Lab 10/15/19 1735 10/16/19 0426 10/17/19 0318  AST 84* 62* 73*  ALT 187* 164* 145*  ALKPHOS 219* 233* 220*  BILITOT 1.0 1.4* 1.1  PROT 7.2 7.7 7.6  ALBUMIN 3.6 3.6 3.6   Recent Labs  Lab 10/15/19 1735  LIPASE 17   Recent Labs  Lab 10/16/19 0426  AMMONIA 25   Coagulation Profile: No results for input(s): INR, PROTIME in the last 168 hours. Cardiac Enzymes: No results for input(s): CKTOTAL, CKMB, CKMBINDEX, TROPONINI in the last 168 hours. BNP (last 3 results) No results for input(s): PROBNP in the last 8760 hours. HbA1C: No results for input(s): HGBA1C in the last 72 hours. CBG: No results for input(s): GLUCAP in the last 168 hours. Lipid Profile: No results for input(s): CHOL, HDL, LDLCALC, TRIG, CHOLHDL, LDLDIRECT in the last 72 hours. Thyroid Function Tests: No results for input(s): TSH, T4TOTAL, FREET4, T3FREE, THYROIDAB in the last 72 hours. Anemia Panel: No results for input(s): VITAMINB12, FOLATE, FERRITIN, TIBC, IRON, RETICCTPCT in the last 72 hours. Urine analysis:    Component Value Date/Time   COLORURINE YELLOW 10/15/2019 1735   APPEARANCEUR CLEAR 10/15/2019 1735   LABSPEC 1.019 10/15/2019 1735   LABSPEC 1.015 03/02/2012 1620   PHURINE 7.0 10/15/2019 1735   GLUCOSEU NEGATIVE 10/15/2019 1735   GLUCOSEU NEGATIVE 03/19/2012 1426   HGBUR SMALL (A) 10/15/2019 1735   BILIRUBINUR NEGATIVE 10/15/2019 1735   BILIRUBINUR NEG 08/12/2012 1631   BILIRUBINUR Negative 03/02/2012 1620   KETONESUR NEGATIVE 10/15/2019 1735   PROTEINUR 30 (A) 10/15/2019 1735   UROBILINOGEN 0.2 07/07/2015 2056   NITRITE NEGATIVE 10/15/2019 1735   LEUKOCYTESUR NEGATIVE 10/15/2019 1735    LEUKOCYTESUR Negative 03/02/2012 1620   Sepsis Labs: @LABRCNTIP (procalcitonin:4,lacticidven:4)  ) Recent Results (from the past 240 hour(s))  Respiratory Panel by RT PCR (Flu A&B, Covid) - Nasopharyngeal Swab     Status: None   Collection Time: 10/15/19  8:31 PM   Specimen: Nasopharyngeal Swab  Result Value Ref Range Status   SARS Coronavirus 2 by RT PCR NEGATIVE NEGATIVE Final    Comment: (NOTE) SARS-CoV-2 target nucleic acids are NOT DETECTED. The SARS-CoV-2 RNA is generally detectable in upper respiratoy specimens during the acute phase of infection. The lowest concentration of SARS-CoV-2 viral copies this assay can detect is 131 copies/mL. A negative result does not preclude SARS-Cov-2 infection and should not be used as the sole basis for treatment or other patient management decisions. A negative result may occur with  improper specimen collection/handling, submission of specimen other than nasopharyngeal swab, presence of viral mutation(s) within the areas targeted by this assay, and inadequate number of viral copies (<131 copies/mL). A  negative result must be combined with clinical observations, patient history, and epidemiological information. The expected result is Negative. Fact Sheet for Patients:  PinkCheek.be Fact Sheet for Healthcare Providers:  GravelBags.it This test is not yet ap proved or cleared by the [place] FDA and  has been authorized for detection and/or diagnosis of SARS-CoV-2 by FDA under an Emergency Use Authorization (EUA). This EUA will remain  in effect (meaning this test can be used) for the duration of the COVID-19 declaration under Section 564(b)(1) of the Act, 21 U.S.C. section 360bbb-3(b)(1), unless the authorization is terminated or revoked sooner.    Influenza A by PCR NEGATIVE NEGATIVE Final   Influenza B by PCR NEGATIVE NEGATIVE Final    Comment: (NOTE) The Xpert Xpress  SARS-CoV-2/FLU/RSV assay is intended as an aid in  the diagnosis of influenza from Nasopharyngeal swab specimens and  should not be used as a sole basis for treatment. Nasal washings and  aspirates are unacceptable for Xpert Xpress SARS-CoV-2/FLU/RSV  testing. Fact Sheet for Patients: PinkCheek.be Fact Sheet for Healthcare Providers: GravelBags.it This test is not yet approved or cleared by the [place] FDA and  has been authorized for detection and/or diagnosis of SARS-CoV-2 by  FDA under an Emergency Use Authorization (EUA). This EUA will remain  in effect (meaning this test can be used) for the duration of the  Covid-19 declaration under Section 564(b)(1) of the Act, 21  U.S.C. section 360bbb-3(b)(1), unless the authorization is  terminated or revoked. Performed at [facility], [place] [address], [place], [place] [address]   MRSA PCR Screening     Status: None   Collection Time: 10/16/19  6:55 PM   Specimen: Nasal Mucosa; Nasopharyngeal  Result Value Ref Range Status   MRSA by PCR NEGATIVE NEGATIVE Final    Comment:        The GeneXpert MRSA Assay (FDA approved for NASAL specimens only), is one component of a comprehensive MRSA colonization surveillance program. It is not intended to diagnose MRSA infection nor to guide or monitor treatment for MRSA infections. Performed at [facility], [place] [address] [place], [place] [address]       Studies: DG Chest Port 1 View  Result Date: 10/17/2019 CLINICAL DATA:  Confusion EXAM: PORTABLE CHEST 1 VIEW COMPARISON:  12/02/2017 FINDINGS: Cardiac shadow is within normal limits. The lungs are well aerated bilaterally. No focal infiltrate or sizable effusion is seen. No acute bony abnormality is noted. IMPRESSION: No acute abnormality seen. Electronically Signed   By: Inez Catalina M.D.   On: 10/17/2019 08:25    Scheduled Meds: .  amLODipine  5 mg Oral Daily  . Chlorhexidine Gluconate Cloth  6 each Topical Daily  . enoxaparin (LOVENOX) injection  40 mg Subcutaneous Q24H  . folic acid  1 mg Oral Daily  . multivitamin with minerals  1 tablet Oral Daily  . thiamine  100 mg Oral Daily   Or  . thiamine  100 mg Intravenous Daily    Continuous Infusions: . ciprofloxacin Stopped (10/17/19 1416)  . dexmedetomidine (PRECEDEX) IV infusion 0.8 mcg/kg/hr (10/17/19 1500)  . famotidine (PEPCID) IV Stopped (10/17/19 1031)  . metronidazole Stopped (10/17/19 1633)     LOS: 1 day     Alma Friendly, MD [facility]  If 7PM-7AM, please contact night-coverage [email or web address] 10/17/2019, 5:20 PM

## 2019-10-18 ENCOUNTER — Inpatient Hospital Stay (HOSPITAL_COMMUNITY): Payer: Medicaid Other

## 2019-10-18 DIAGNOSIS — F10121 Alcohol abuse with intoxication delirium: Secondary | ICD-10-CM

## 2019-10-18 DIAGNOSIS — F1910 Other psychoactive substance abuse, uncomplicated: Secondary | ICD-10-CM

## 2019-10-18 LAB — CBC WITH DIFFERENTIAL/PLATELET
Abs Immature Granulocytes: 0.13 10*3/uL — ABNORMAL HIGH (ref 0.00–0.07)
Basophils Absolute: 0.1 10*3/uL (ref 0.0–0.1)
Basophils Relative: 1 %
Eosinophils Absolute: 0 10*3/uL (ref 0.0–0.5)
Eosinophils Relative: 0 %
HCT: 44.3 % (ref 39.0–52.0)
Hemoglobin: 14.8 g/dL (ref 13.0–17.0)
Immature Granulocytes: 1 %
Lymphocytes Relative: 16 %
Lymphs Abs: 2.3 10*3/uL (ref 0.7–4.0)
MCH: 31.2 pg (ref 26.0–34.0)
MCHC: 33.4 g/dL (ref 30.0–36.0)
MCV: 93.3 fL (ref 80.0–100.0)
Monocytes Absolute: 0.8 10*3/uL (ref 0.1–1.0)
Monocytes Relative: 6 %
Neutro Abs: 11.2 10*3/uL — ABNORMAL HIGH (ref 1.7–7.7)
Neutrophils Relative %: 76 %
Platelets: 210 10*3/uL (ref 150–400)
RBC: 4.75 MIL/uL (ref 4.22–5.81)
RDW: 14 % (ref 11.5–15.5)
WBC: 14.6 10*3/uL — ABNORMAL HIGH (ref 4.0–10.5)
nRBC: 0 % (ref 0.0–0.2)

## 2019-10-18 LAB — COMPREHENSIVE METABOLIC PANEL
ALT: 116 U/L — ABNORMAL HIGH (ref 0–44)
AST: 79 U/L — ABNORMAL HIGH (ref 15–41)
Albumin: 3.3 g/dL — ABNORMAL LOW (ref 3.5–5.0)
Alkaline Phosphatase: 227 U/L — ABNORMAL HIGH (ref 38–126)
Anion gap: 13 (ref 5–15)
BUN: 34 mg/dL — ABNORMAL HIGH (ref 6–20)
CO2: 16 mmol/L — ABNORMAL LOW (ref 22–32)
Calcium: 8.8 mg/dL — ABNORMAL LOW (ref 8.9–10.3)
Chloride: 107 mmol/L (ref 98–111)
Creatinine, Ser: 1.14 mg/dL (ref 0.61–1.24)
GFR calc Af Amer: >60 mL/min (ref >60)
GFR calc non Af Amer: >60 mL/min (ref >60)
Glucose, Bld: 112 mg/dL — ABNORMAL HIGH (ref 70–99)
Potassium: 3.8 mmol/L (ref 3.5–5.1)
Sodium: 136 mmol/L (ref 135–145)
Total Bilirubin: 1.3 mg/dL — ABNORMAL HIGH (ref 0.3–1.2)
Total Protein: 6.5 g/dL (ref 6.5–8.1)

## 2019-10-18 LAB — URINE DRUGS OF ABUSE SCREEN W ALC, ROUTINE (REF LAB)
Amphetamines, Urine: NEGATIVE ng/mL
Barbiturate, Ur: NEGATIVE ng/mL
Benzodiazepine Quant, Ur: NEGATIVE ng/mL
Cannabinoid Quant, Ur: NEGATIVE ng/mL
Cocaine (Metab.): NEGATIVE ng/mL
Ethanol U, Quan: NEGATIVE %
Methadone Screen, Urine: NEGATIVE ng/mL
Opiate Quant, Ur: NEGATIVE ng/mL
Phencyclidine, Ur: NEGATIVE ng/mL
Propoxyphene, Urine: NEGATIVE ng/mL

## 2019-10-18 LAB — PROCALCITONIN: Procalcitonin: 4.2 ng/mL

## 2019-10-18 MED ORDER — FAMOTIDINE 20 MG PO TABS: 20.0000 mg | ORAL_TABLET | Freq: Two times a day (BID) | ORAL | Status: DC

## 2019-10-18 MED ORDER — SODIUM CHLORIDE 0.9 % IV SOLN: INTRAVENOUS | Status: DC

## 2019-10-18 NOTE — Discharge Summary (Signed)
Discharge Summary  Martin [name] MVE:[id number] DOB: [birth year]  PCP: Patient, No Pcp Per  Admit date: 10/15/2019 Discharge date: 10/18/2019  Time spent: 40 minutes  Recommendations for Outpatient Follow-up:  1. None, signed AMA  Discharge Diagnoses:  Active Hospital Problems   Diagnosis Date Noted  . Enteritis 10/15/2019  . Abnormal liver function 10/15/2019  . Leukocytosis 10/15/2019  . Nausea & vomiting 10/15/2019  . Hypertension   . GERD (gastroesophageal reflux disease)     Resolved Hospital Problems  No resolved problems to display.    Discharge Condition: Unstable  Diet recommendation: None  Vitals:   10/18/19 1600 10/18/19 1700  BP: (!) 147/89   Pulse: 82   Resp: (!) 31   Temp:  97.7 F (36.5 C)  SpO2: 99%     History of present illness:  Martin [name] a58 y.o.male,w h/o anal cancer, gerd, anxiety, hypertension, presents with c/o n/v x 2days. Pt denies fever, chills, cough, cp, palp, abd pain, diarrhea, brbpr, black stool. In the ED,  Pt afebrile, saturating well on room air, BP noted to be elevated at 221/90, labs showed WBC 21.1, hemoglobin 14.2, AST 84, ALT 187, T bili 1, urinalysis showed RBC 11-20, WBC 0-5, CT abdomen/pelvis showed 7 to 10 cm segment of terminal ileum with circumferential wall thickening and wall edema compatible with infectious/inflammatory enteritis. Patient admitted for further management.   Today, patient still requiring Precedex, but noted to be more awake/alert this morning.  Had a lengthy discussion with patient, of which he reported binge drinking hard liquor for weeks (including leg, gin/vodka), as well as using multiple drugs by snorting eluding cocaine, heroine etc. discussed in details about the risks of polysubstance abuse, patient reported he will try to quit.  Later this evening, patient requesting to sign AMA as he needed to take care of his elderly mom who has dementia.  Patient is alert, awake, oriented and is able  to make his own decision.  Patient stated his daughter will be picking him up.  Advised patient the risks of withdrawing, even possible death, patient still insisting on leaving, signed AMA.    Hospital Course:  Principal Problem:   Enteritis Active Problems:   GERD (gastroesophageal reflux disease)   Hypertension   Abnormal liver function   Leukocytosis   Nausea & vomiting   Acute enteritis Currently afebrile, with leukocytosis Lipase 17, mild transaminitis BC x2 NGTD Procalcitonin 8.73--> 7.2-->4.2 CT abdomen/pelvis showed 7 to 10 cm segment of terminal ileum with circumferential wall thickening and wall edema compatible with infectious/inflammatory enteritis.  S/P ciprofloxacin, Flagyl  Transaminitis CT abdomen/pelvis unremarkable for any hepatic pathology Hepatitis panel nonreactive  Hypokalemia  Alcohol abuse/polysubstance abuse Noted withdrawal/delirium Significant alcohol abuse with bootleg, gin/vodka, in addition to snorting heroine, cocaine, marijuana Alcohol unremarkable, UDS positive for benzos, opiates (although both ordered late) S/P Precedex  Signed AMA  Hypertension BP uncontrolled Signed AMA  GERD Continue Pepcid  Pulmonary nodule Follow-up CT chest with contrast in 3 months         Malnutrition Type:      Malnutrition Characteristics:      Nutrition Interventions:      Estimated body mass index is 19.01 kg/m as calculated from the following:   Height as of this encounter: 5' 10"  (1.778 m).   Weight as of this encounter: 60.1 kg.    Procedures:  None  Consultations:  None  Discharge Exam: BP (!) 147/89   Pulse 82   Temp 97.7 F (36.5 C) (Oral)  Resp (!) 31   Ht 5' 10"  (1.778 m)   Wt 60.1 kg   SpO2 99%   BMI 19.01 kg/m   General: NAD, alert, awake, oriented Cardiovascular: S1, S2 present Respiratory: CTAB  Discharge Instructions You were cared for by a hospitalist during your hospital stay. If you  have any questions about your discharge medications or the care you received while you were in the hospital after you are discharged, you can call the unit and asked to speak with the hospitalist on call if the hospitalist that took care of you is not available. Once you are discharged, your primary care physician will handle any further medical issues. Please note that NO REFILLS for any discharge medications will be authorized once you are discharged, as it is imperative that you return to your primary care physician (or establish a relationship with a primary care physician if you do not have one) for your aftercare needs so that they can reassess your need for medications and monitor your lab values.    Allergies  Allergen Reactions  . Yellow Jacket Venom [Bee Venom] Anaphylaxis  . Aspirin Swelling  . Codeine Nausea Only  . Morphine And Related Itching      The results of significant diagnostics from this hospitalization (including imaging, microbiology, ancillary and laboratory) are listed below for reference.    Significant Diagnostic Studies: CT Abdomen Pelvis W Contrast  Result Date: 10/15/2019 CLINICAL DATA:  Nausea and vomiting. Abdominal pain. EXAM: CT ABDOMEN AND PELVIS WITH CONTRAST TECHNIQUE: Multidetector CT imaging of the abdomen and pelvis was performed using the standard protocol following bolus administration of intravenous contrast. CONTRAST:  138m OMNIPAQUE IOHEXOL 300 MG/ML  SOLN COMPARISON:  None. 10/25/2014 FINDINGS: Lower chest: Areas of scarring in the right base are stable. There is a new 8 x 8 mm nodular component to some scarring in the medial right lung base (image 14/series 4). Hepatobiliary: No suspicious focal abnormality within the liver parenchyma. There is no evidence for gallstones, gallbladder wall thickening, or pericholecystic fluid. No intrahepatic or extrahepatic biliary dilation. Pancreas: No focal mass lesion. No dilatation of the main duct. No  intraparenchymal cyst. No peripancreatic edema. Spleen: No splenomegaly. No focal mass lesion. Adrenals/Urinary Tract: No adrenal nodule or mass. Kidneys unremarkable. No evidence for hydroureter. The urinary bladder appears normal for the degree of distention. Stomach/Bowel: Stomach is unremarkable. No gastric wall thickening. No evidence of outlet obstruction. Duodenum is normally positioned as is the ligament of Treitz. No small bowel wall thickening. No small bowel dilatation. 7-10 cm segment of terminal ileum shows circumferential wall thickening and edema without substantial perienteric edema or inflammation. Small bowel proximal proximal to this is mildly distended but there appears to be a small bowel anastomosis in this region which could account for the distension Nonvisualization of the appendix is consistent with the reported history of appendectomy. No gross colonic mass. No colonic wall thickening. Vascular/Lymphatic: There is abdominal aortic atherosclerosis without aneurysm. There is no gastrohepatic or hepatoduodenal ligament lymphadenopathy. No retroperitoneal or mesenteric lymphadenopathy. No pelvic sidewall lymphadenopathy. Reproductive: Unremarkable Other: No intraperitoneal free fluid. Musculoskeletal: No worrisome lytic or sclerotic osseous abnormality. IMPRESSION: 1. 7-10 cm segment of terminal ileum shows circumferential wall thickening and wall edema without substantial perienteric edema or inflammation. Imaging features are compatible with an infectious/inflammatory enteritis. No evidence for perforation or abscess. Abnormal ileum is nonobstructing. 2. New 8 x 8 mm nodular component to some scarring in the medial right lung base. This may be related  to scarring or infection/inflammation, but follow-up CT chest without contrast in 3 months is recommended to ensure resolution. 3. Aortic Atherosclerosis (ICD10-I70.0). Electronically Signed   By: Misty Stanley M.D.   On: 10/15/2019 19:44    DG Chest Port 1 View  Result Date: 10/17/2019 CLINICAL DATA:  Confusion EXAM: PORTABLE CHEST 1 VIEW COMPARISON:  12/02/2017 FINDINGS: Cardiac shadow is within normal limits. The lungs are well aerated bilaterally. No focal infiltrate or sizable effusion is seen. No acute bony abnormality is noted. IMPRESSION: No acute abnormality seen. Electronically Signed   By: Inez Catalina M.D.   On: 10/17/2019 08:25   US Abdomen Limited RUQ  Result Date: 10/18/2019 CLINICAL DATA:  Transaminitis EXAM: ULTRASOUND ABDOMEN LIMITED RIGHT UPPER QUADRANT COMPARISON:  CT 10/15/2019 FINDINGS: Gallbladder: Gallbladder sludge noted. The gallbladder wall is upper limits of normal in thickness measuring 3 mm. No sonographic Murphy sign or pericholecystic fluid. Common bile duct: Diameter: 4 mm Liver: No focal lesion identified. Within normal limits in parenchymal echogenicity. Portal vein is patent on color Doppler imaging with normal direction of blood flow towards the liver. Other: None. IMPRESSION: 1. Gallbladder sludge. Gallbladder wall is upper limits of normal in thickness measuring 3 mm. Electronically Signed   By: Kerby Moors M.D.   On: 10/18/2019 16:09    Microbiology: Recent Results (from the past 240 hour(s))  Respiratory Panel by RT PCR (Flu A&B, Covid) - Nasopharyngeal Swab     Status: None   Collection Time: 10/15/19  8:31 PM   Specimen: Nasopharyngeal Swab  Result Value Ref Range Status   SARS Coronavirus 2 by RT PCR NEGATIVE NEGATIVE Final    Comment: (NOTE) SARS-CoV-2 target nucleic acids are NOT DETECTED. The SARS-CoV-2 RNA is generally detectable in upper respiratoy specimens during the acute phase of infection. The lowest concentration of SARS-CoV-2 viral copies this assay can detect is 131 copies/mL. A negative result does not preclude SARS-Cov-2 infection and should not be used as the sole basis for treatment or other patient management decisions. A negative result may occur with  improper  specimen collection/handling, submission of specimen other than nasopharyngeal swab, presence of viral mutation(s) within the areas targeted by this assay, and inadequate number of viral copies (<131 copies/mL). A negative result must be combined with clinical observations, patient history, and epidemiological information. The expected result is Negative. Fact Sheet for Patients:  PinkCheek.be Fact Sheet for Healthcare Providers:  GravelBags.it This test is not yet ap proved or cleared by the [place] FDA and  has been authorized for detection and/or diagnosis of SARS-CoV-2 by FDA under an Emergency Use Authorization (EUA). This EUA will remain  in effect (meaning this test can be used) for the duration of the COVID-19 declaration under Section 564(b)(1) of the Act, 21 U.S.C. section 360bbb-3(b)(1), unless the authorization is terminated or revoked sooner.    Influenza A by PCR NEGATIVE NEGATIVE Final   Influenza B by PCR NEGATIVE NEGATIVE Final    Comment: (NOTE) The Xpert Xpress SARS-CoV-2/FLU/RSV assay is intended as an aid in  the diagnosis of influenza from Nasopharyngeal swab specimens and  should not be used as a sole basis for treatment. Nasal washings and  aspirates are unacceptable for Xpert Xpress SARS-CoV-2/FLU/RSV  testing. Fact Sheet for Patients: PinkCheek.be Fact Sheet for Healthcare Providers: GravelBags.it This test is not yet approved or cleared by the [place] FDA and  has been authorized for detection and/or diagnosis of SARS-CoV-2 by  FDA under an Emergency Use Authorization (EUA).  This EUA will remain  in effect (meaning this test can be used) for the duration of the  Covid-19 declaration under Section 564(b)(1) of the Act, 21  U.S.C. section 360bbb-3(b)(1), unless the authorization is  terminated or revoked. Performed at [facility], [place] [address], [place], [place] [address]   Culture, blood (routine x 2)     Status: None (Preliminary result)   Collection Time: 10/16/19  2:09 PM   Specimen: BLOOD  Result Value Ref Range Status   Specimen Description   Final    BLOOD BLOOD LEFT WRIST Performed at [facility] [address], [place], [place] [address]    Special Requests   Final    BOTTLES DRAWN AEROBIC ONLY Blood Culture results may not be optimal due to an inadequate volume of blood received in culture bottles Performed at [facility] [address], [place], [place] [address]    Culture   Final    NO GROWTH 2 DAYS Performed at [facility] [address] [place], [place] [address]    Report Status PENDING  Incomplete  Culture, blood (routine x 2)     Status: None (Preliminary result)   Collection Time: 10/16/19  2:09 PM   Specimen: BLOOD LEFT ARM  Result Value Ref Range Status   Specimen Description   Final    BLOOD LEFT ARM Performed at [facility] [address] [place], [place] [address]    Special Requests   Final    BOTTLES DRAWN AEROBIC ONLY Blood Culture adequate volume Performed at [facility] [address], [place], [place] [address]    Culture   Final    NO GROWTH 2 DAYS Performed at [facility] [address] [place], [place] [address]    Report Status PENDING  Incomplete  MRSA PCR Screening     Status: None   Collection Time: 10/16/19  6:55 PM   Specimen: Nasal Mucosa; Nasopharyngeal  Result Value Ref Range Status   MRSA by PCR NEGATIVE NEGATIVE Final    Comment:        The GeneXpert MRSA Assay (FDA approved for NASAL specimens only), is one component of a comprehensive MRSA colonization surveillance program. It is not intended to diagnose MRSA infection nor to guide or monitor treatment for MRSA infections. Performed at [facility], [place] [address], [place], [place] [address]      Labs: Basic Metabolic Panel: Recent Labs  Lab 10/15/19 1735 10/16/19 0426 10/17/19 0318 10/18/19 0243  NA 133* 135 134* 136  K 4.2 3.3* 3.2* 3.8  CL 102 104 104 107  CO2 22 20* 19* 16*  GLUCOSE 113* 107* 140* 112*  BUN 15 12 15  34*  CREATININE 0.73 0.79 0.77 1.14  CALCIUM 9.3 9.2 9.2 8.8*  MG  --  2.0  --   --   PHOS  --  2.8  --   --    Liver Function Tests: Recent Labs  Lab 10/15/19 1735 10/16/19 0426 10/17/19 0318 10/18/19 0243  AST 84* 62* 73* 79*  ALT 187* 164* 145* 116*  ALKPHOS 219* 233* 220* 227*  BILITOT 1.0 1.4* 1.1 1.3*  PROT 7.2 7.7 7.6 6.5  ALBUMIN 3.6 3.6 3.6 3.3*   Recent Labs  Lab 10/15/19 1735  LIPASE 17   Recent Labs  Lab 10/16/19 0426  AMMONIA 25   CBC: Recent Labs  Lab 10/15/19 1735 10/16/19  4627 10/17/19 0318 10/18/19 0243  WBC 21.1* 20.5*  20.4* 17.8* 14.6*  NEUTROABS 18.8*  --  15.1* 11.2*  HGB 14.2 15.5  16.0 16.9 14.8  HCT 41.4 44.6  45.4 49.3 44.3  MCV 91.0 89.6  89.5 90.1 93.3  PLT 153 184  176 213 210   Cardiac Enzymes: No results for input(s): CKTOTAL, CKMB, CKMBINDEX, TROPONINI in the last 168 hours. BNP: BNP (last 3 results) No results for input(s): BNP in the last 8760 hours.  ProBNP (last 3 results) No results for input(s): PROBNP in the last 8760 hours.  CBG: No results for input(s): GLUCAP in the last 168 hours.     Signed:  Alma Friendly, MD [facility] 10/18/2019, 5:46 PM

## 2019-10-18 NOTE — Progress Notes (Signed)
PHARMACIST - PHYSICIAN COMMUNICATION  CONCERNING: IV to Oral Route Change Policy  RECOMMENDATION: This patient is receiving pepcid by the intravenous route.  Based on criteria approved by the Pharmacy and Therapeutics Committee, the intravenous medication(s) is/are being converted to the equivalent oral dose form(s).   DESCRIPTION: These criteria include:  The patient is eating (either orally or via tube) and/or has been taking other orally administered medications for a least 24 hours  The patient has no evidence of active gastrointestinal bleeding or impaired GI absorption (gastrectomy, short bowel, patient on TNA or NPO).  If you have questions about this conversion, please contact the Pharmacy Department  []   [phone] )  [facility] []   [phone] )  [facility] []   [phone] )  [facility] []   [phone] )  [facility] [x]   [phone] )  [facility]   Eudelia Bunch, [facility] 10/18/2019 10:39 AM

## 2019-10-18 NOTE — Progress Notes (Signed)
Patient is signing out AMA stating that his 61 yo mother is home alone with dementia and has no one to care for her. He states he has no one to check on her and is refusing to stay here any longer for treatment. I attempted to explore other options with him but was unsuccessful. He says there is no other option and that his daughter is coming to pick him up right now. Dr Horris Latino notified and aware.

## 2019-10-19 ENCOUNTER — Emergency Department (HOSPITAL_COMMUNITY)
Admission: EM | Admit: 2019-10-19 | Discharge: 2019-10-19 | Disposition: A | Discharge status: Home / Self Care | Payer: Medicaid Other | Attending: Emergency Medicine | Admitting: Emergency Medicine

## 2019-10-19 ENCOUNTER — Encounter (HOSPITAL_COMMUNITY): Payer: Self-pay | Admitting: *Deleted

## 2019-10-19 ENCOUNTER — Other Ambulatory Visit: Payer: Self-pay

## 2019-10-19 DIAGNOSIS — Z5321 Procedure and treatment not carried out due to patient leaving prior to being seen by health care provider: Secondary | ICD-10-CM | POA: Insufficient documentation

## 2019-10-19 DIAGNOSIS — R112 Nausea with vomiting, unspecified: Secondary | ICD-10-CM | POA: Diagnosis not present

## 2019-10-19 DIAGNOSIS — R109 Unspecified abdominal pain: Secondary | ICD-10-CM | POA: Diagnosis present

## 2019-10-19 MED ORDER — SODIUM CHLORIDE 0.9% FLUSH: 3.0000 mL | Freq: Once | INTRAVENOUS | Status: DC

## 2019-10-19 NOTE — ED Triage Notes (Signed)
Pt arrives reporting abdominal pain, nausea and vomiting. Reports he was just in the hospital and had to leave AMA. Reporting dark colored urine and black stools.

## 2019-10-19 NOTE — ED Notes (Signed)
Pt states he needs U/S IV for blood work and IV

## 2019-10-20 ENCOUNTER — Other Ambulatory Visit: Payer: Self-pay

## 2019-10-20 ENCOUNTER — Encounter (HOSPITAL_COMMUNITY): Payer: Self-pay | Admitting: *Deleted

## 2019-10-20 ENCOUNTER — Emergency Department (HOSPITAL_COMMUNITY)
Admission: EM | Admit: 2019-10-20 | Discharge: 2019-10-21 | Disposition: A | Discharge status: Home / Self Care | Payer: Medicaid Other | Attending: Emergency Medicine | Admitting: Emergency Medicine

## 2019-10-20 DIAGNOSIS — R1084 Generalized abdominal pain: Secondary | ICD-10-CM | POA: Diagnosis present

## 2019-10-20 DIAGNOSIS — K2970 Gastritis, unspecified, without bleeding: Secondary | ICD-10-CM

## 2019-10-20 DIAGNOSIS — F17210 Nicotine dependence, cigarettes, uncomplicated: Secondary | ICD-10-CM | POA: Insufficient documentation

## 2019-10-20 DIAGNOSIS — Z85048 Personal history of other malignant neoplasm of rectum, rectosigmoid junction, and anus: Secondary | ICD-10-CM | POA: Diagnosis not present

## 2019-10-20 DIAGNOSIS — K529 Noninfective gastroenteritis and colitis, unspecified: Secondary | ICD-10-CM | POA: Diagnosis not present

## 2019-10-20 DIAGNOSIS — I10 Essential (primary) hypertension: Secondary | ICD-10-CM | POA: Diagnosis not present

## 2019-10-20 LAB — URINALYSIS, ROUTINE W REFLEX MICROSCOPIC
Bilirubin Urine: NEGATIVE
Glucose, UA: NEGATIVE mg/dL
Ketones, ur: NEGATIVE mg/dL
Leukocytes,Ua: NEGATIVE
Nitrite: NEGATIVE
Protein, ur: NEGATIVE mg/dL
Specific Gravity, Urine: 1.024 (ref 1.005–1.030)
pH: 6 (ref 5.0–8.0)

## 2019-10-20 LAB — CBC
HCT: 44.3 % (ref 39.0–52.0)
Hemoglobin: 14.6 g/dL (ref 13.0–17.0)
MCH: 30.7 pg (ref 26.0–34.0)
MCHC: 33 g/dL (ref 30.0–36.0)
MCV: 93.3 fL (ref 80.0–100.0)
Platelets: 259 10*3/uL (ref 150–400)
RBC: 4.75 MIL/uL (ref 4.22–5.81)
RDW: 13.8 % (ref 11.5–15.5)
WBC: 15.5 10*3/uL — ABNORMAL HIGH (ref 4.0–10.5)
nRBC: 0 % (ref 0.0–0.2)

## 2019-10-20 LAB — COMPREHENSIVE METABOLIC PANEL
ALT: 133 U/L — ABNORMAL HIGH (ref 0–44)
AST: 94 U/L — ABNORMAL HIGH (ref 15–41)
Albumin: 3.5 g/dL (ref 3.5–5.0)
Alkaline Phosphatase: 225 U/L — ABNORMAL HIGH (ref 38–126)
Anion gap: 11 (ref 5–15)
BUN: 16 mg/dL (ref 6–20)
CO2: 28 mmol/L (ref 22–32)
Calcium: 9.3 mg/dL (ref 8.9–10.3)
Chloride: 99 mmol/L (ref 98–111)
Creatinine, Ser: 1.07 mg/dL (ref 0.61–1.24)
GFR calc Af Amer: >60 mL/min (ref >60)
GFR calc non Af Amer: >60 mL/min (ref >60)
Glucose, Bld: 135 mg/dL — ABNORMAL HIGH (ref 70–99)
Potassium: 4.2 mmol/L (ref 3.5–5.1)
Sodium: 138 mmol/L (ref 135–145)
Total Bilirubin: 0.8 mg/dL (ref 0.3–1.2)
Total Protein: 6.6 g/dL (ref 6.5–8.1)

## 2019-10-20 LAB — LIPASE, BLOOD: Lipase: 31 U/L (ref 11–51)

## 2019-10-20 MED ORDER — SODIUM CHLORIDE 0.9% FLUSH: 3.0000 mL | Freq: Once | INTRAVENOUS | Status: DC

## 2019-10-20 NOTE — ED Triage Notes (Signed)
Pt arrives ambulatory with c/o abdominal pain, very dark urine and dark colored stools. Nausea, 1 episode of vomiting in 24 hours. Pt went to [facility] last night, left prior to being seen.

## 2019-10-21 ENCOUNTER — Emergency Department (HOSPITAL_COMMUNITY): Payer: Medicaid Other

## 2019-10-21 ENCOUNTER — Encounter (HOSPITAL_COMMUNITY): Payer: Self-pay | Admitting: Emergency Medicine

## 2019-10-21 LAB — CULTURE, BLOOD (ROUTINE X 2)
Culture: NO GROWTH
Culture: NO GROWTH
Special Requests: ADEQUATE

## 2019-10-21 MED ORDER — OMEPRAZOLE 20 MG PO CPDR: 20.0000 mg | DELAYED_RELEASE_CAPSULE | Freq: Every day | ORAL | 0 refills | Status: DC

## 2019-10-21 MED ORDER — ONDANSETRON 8 MG PO TBDP: ORAL_TABLET | ORAL | 0 refills | Status: DC

## 2019-10-21 MED ORDER — AMOXICILLIN-POT CLAVULANATE 875-125 MG PO TABS
1.0000 | ORAL_TABLET | Freq: Once | ORAL | Status: AC
  Administered 2019-10-21: 1 via ORAL
  Filled 2019-10-21: qty 1

## 2019-10-21 MED ORDER — HALOPERIDOL LACTATE 5 MG/ML IJ SOLN
2.0000 mg | Freq: Once | INTRAMUSCULAR | Status: AC
  Administered 2019-10-21: 03:00:00 2 mg via INTRAVENOUS
  Filled 2019-10-21: qty 1

## 2019-10-21 MED ORDER — ALUM & MAG HYDROXIDE-SIMETH 200-200-20 MG/5ML PO SUSP
30.0000 mL | Freq: Once | ORAL | Status: AC
  Administered 2019-10-21: 30 mL via ORAL
  Filled 2019-10-21: qty 30

## 2019-10-21 MED ORDER — IOHEXOL 300 MG/ML  SOLN
100.0000 mL | Freq: Once | INTRAMUSCULAR | Status: AC | PRN
  Administered 2019-10-21: 100 mL via INTRAVENOUS

## 2019-10-21 MED ORDER — LIDOCAINE VISCOUS HCL 2 % MT SOLN
15.0000 mL | Freq: Once | OROMUCOSAL | Status: AC
  Administered 2019-10-21: 02:00:00 15 mL via ORAL
  Filled 2019-10-21: qty 15

## 2019-10-21 MED ORDER — AMOXICILLIN-POT CLAVULANATE 875-125 MG PO TABS: 1.0000 | ORAL_TABLET | Freq: Two times a day (BID) | ORAL | 0 refills | Status: DC

## 2019-10-21 MED ORDER — SUCRALFATE 1 GM/10ML PO SUSP
1.0000 g | Freq: Once | ORAL | Status: AC
  Administered 2019-10-21: 04:00:00 1 g via ORAL
  Filled 2019-10-21: qty 10

## 2019-10-21 MED ORDER — SUCRALFATE 1 GM/10ML PO SUSP: 1.0000 g | Freq: Three times a day (TID) | ORAL | 0 refills | Status: DC

## 2019-10-21 MED FILL — AMOX-CLAV 875-125 MG TABLET: 875-125 | 7 days supply | Qty: 14 | Fill #0

## 2019-10-21 MED FILL — SUCRALFATE 1 GM/10ML SUSP: 1 | 11 days supply | Qty: 420 | Fill #0

## 2019-10-21 MED FILL — OMEPRAZOLE 20 MG CAP: 20 | 30 days supply | Qty: 30 | Fill #0

## 2019-10-21 NOTE — ED Provider Notes (Addendum)
[facility] EMERGENCY DEPARTMENT Provider Note   CSN: [id number] Arrival date & time: 10/20/19  2029     History Chief Complaint  Patient presents with  . Abdominal Pain    Martin [name] is a 59 y.o. male.  The history is provided by the patient.  Abdominal Pain Pain location:  Generalized Pain quality: aching   Pain radiates to:  Does not radiate Pain severity:  Moderate Onset quality:  Gradual Timing:  Constant Progression:  Improving Chronicity:  Recurrent Context: not eating and not laxative use   Relieved by:  Nothing Worsened by:  Nothing Ineffective treatments: home narcotics  Associated symptoms: no anorexia, no chills, no constipation, no cough, no diarrhea, no fever and no shortness of breath   Risk factors comment:  Polysubstance abuse AMA from inpatient service, had ileitis.       Past Medical History:  Diagnosis Date  . Allergy 2 2013  . 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 Anxiety   . Arthritis    DDD lumbar, arthritis knees  . DJD (degenerative joint disease) of lumbar spine   . GERD (gastroesophageal reflux disease)   . Hemorrhoid    internal  . History of bowel resection   . Hypertension    EKG 12/12 EPIC   no  PCP- states increased lately but hasnt been diagnosed formally  . Inguinal hernia   . Pneumonia    as child, cough at present with no fever  . Pneumonia    as child, cough at present with no fever   . Radiation 11/19/11-01/08/12   5040 cGy 28 fx Pelvis and inguinal area    Patient Active Problem List   Diagnosis Date Noted  . Enteritis 10/15/2019  . Abnormal liver function 10/15/2019  . Leukocytosis 10/15/2019  . Nausea & vomiting 10/15/2019  . GAD (generalized anxiety disorder) 05/18/2017  . 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 Constipation 04/11/2017  . Bee sting 04/05/2017  . Diverticulosis 11/29/2014  . Healthcare maintenance 11/06/2014  . Cough 10/05/2014  .  Insomnia 09/13/2014  . Erectile dysfunction 11/30/2013  . Chronic pain syndrome 11/30/2013  . Depression 11/30/2013  . GERD (gastroesophageal reflux disease)   . Hypertension   . Anxiety   . Arthritis   . DJD (degenerative joint disease) of lumbar spine   . Radiation   . Night sweats 08/12/2012  . Tobacco abuse counseling 05/26/2012  . 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 B12 nutritional deficiency 03/19/2012  . Folate deficiency 03/19/2012  . Inguinal hernia 11/24/2011  . Cancer ([place]) 10/14/2011  . Internal hemorrhoid 09/11/2011  . Anal cancer ([place]) 10/09/2010    Past Surgical History:  Procedure Laterality Date  . APPENDECTOMY     age 53 or 47  . EXAMINATION UNDER ANESTHESIA  10/14/2011   Procedure: EXAM UNDER ANESTHESIA;  Surgeon: Earnstine Regal, MD;  Location: [facility];  Service: General;  Laterality: N/A;  exam under anethesia, Excision of mass anal canal, 1.5cm  . INGUINAL HERNIA REPAIR  05/11/2012   Procedure: HERNIA REPAIR INGUINAL ADULT;  Surgeon: Earnstine Regal, MD;  Location: [place];  Service: General;  Laterality: Left;  left inguinal hernia repair with mesh  . surgical pathology   10/14/2011   squamous cell ca of anus  . transanal excision  10/14/2011   Dr.Todd Gerkin       Family History  Problem Relation Age of Onset  . Colon  cancer Father   . Asthma Father   . Cancer Father        prostate  . Hypertension Father   . Asthma Mother   . Hypertension Mother   . Stomach cancer Neg Hx   . Esophageal cancer Neg Hx     Social History   Tobacco Use  . Smoking status: Current Every Day Smoker    Packs/day: 1.25    Years: 47.00    Pack years: 58.75    Types: Cigarettes  . Smokeless tobacco: Never Used  . Tobacco comment: Cutting back>> Quit Smart card supplied  Substance Use Topics  . Alcohol use: Yes    Alcohol/week: 6.0 standard drinks    Types: 6 Cans of beer per week    Comment: weekend    6pk weekend, beer   . Drug use: Yes    Types:  Marijuana, Cocaine    Comment: marijuana in past, occasionally now-last use 3-4 weeks ago    Home Medications Prior to Admission medications   Medication Sig Start Date End Date Taking? Authorizing Provider  oxyCODONE-acetaminophen (PERCOCET/ROXICET) 5-325 MG tablet Take 1 tablet by mouth every 4 (four) hours as needed for severe pain. 09/26/19   Gery Pray, MD  sucralfate (CARAFATE) 1 GM/10ML suspension Take 10 mLs (1 g total) by mouth 4 (four) times daily -  with meals and at bedtime. 10/21/19   Yaneliz Radebaugh, MD    Allergies    Yellow jacket venom [bee venom], Aspirin, Codeine, and Morphine and related  Review of Systems   Review of Systems  Constitutional: Negative for chills and fever.  Respiratory: Negative for cough and shortness of breath.   Gastrointestinal: Positive for abdominal pain. Negative for anorexia, constipation and diarrhea.    Physical Exam Updated Vital Signs BP (!) 150/65   Pulse 81   Temp 98.4 F (36.9 C) (Oral)   Resp 18   SpO2 99%   Physical Exam Vitals and nursing note reviewed.  Constitutional:      General: He is not in acute distress.    Appearance: Normal appearance.  HENT:     Head: Normocephalic and atraumatic.     Nose: Nose normal.  Eyes:     Pupils: Pupils are equal, round, and reactive to light.  Cardiovascular:     Rate and Rhythm: Normal rate and regular rhythm.     Pulses: Normal pulses.     Heart sounds: Normal heart sounds.  Pulmonary:     Effort: Pulmonary effort is normal.     Breath sounds: Normal breath sounds.  Abdominal:     General: Abdomen is flat.     Palpations: Abdomen is soft.     Tenderness: There is no abdominal tenderness.  Musculoskeletal:     Cervical back: Normal range of motion and neck supple.  Skin:    General: Skin is warm and dry.     Capillary Refill: Capillary refill takes less than 2 seconds.  Neurological:     General: No focal deficit present.     Mental Status: He is alert and oriented  to person, place, and time.  Psychiatric:        Mood and Affect: Mood normal.        Behavior: Behavior normal.     ED Results / Procedures / Treatments   Labs (all labs ordered are listed, but only abnormal results are displayed) Results for orders placed or performed during the hospital encounter of 10/20/19  Lipase, blood  Result Value Ref Range   Lipase 31 11 - 51 U/L  Comprehensive metabolic panel  Result Value Ref Range   Sodium 138 135 - 145 mmol/L   Potassium 4.2 3.5 - 5.1 mmol/L   Chloride 99 98 - 111 mmol/L   CO2 28 22 - 32 mmol/L   Glucose, Bld 135 (H) 70 - 99 mg/dL   BUN 16 6 - 20 mg/dL   Creatinine, Ser 1.07 0.61 - 1.24 mg/dL   Calcium 9.3 8.9 - 10.3 mg/dL   Total Protein 6.6 6.5 - 8.1 g/dL   Albumin 3.5 3.5 - 5.0 g/dL   AST 94 (H) 15 - 41 U/L   ALT 133 (H) 0 - 44 U/L   Alkaline Phosphatase 225 (H) 38 - 126 U/L   Total Bilirubin 0.8 0.3 - 1.2 mg/dL   GFR calc non Af Amer >60 >60 mL/min   GFR calc Af Amer >60 >60 mL/min   Anion gap 11 5 - 15  CBC  Result Value Ref Range   WBC 15.5 (H) 4.0 - 10.5 K/uL   RBC 4.75 4.22 - 5.81 MIL/uL   Hemoglobin 14.6 13.0 - 17.0 g/dL   HCT 44.3 39.0 - 52.0 %   MCV 93.3 80.0 - 100.0 fL   MCH 30.7 26.0 - 34.0 pg   MCHC 33.0 30.0 - 36.0 g/dL   RDW 13.8 11.5 - 15.5 %   Platelets 259 150 - 400 K/uL   nRBC 0.0 0.0 - 0.2 %  Urinalysis, Routine w reflex microscopic  Result Value Ref Range   Color, Urine AMBER (A) YELLOW   APPearance HAZY (A) CLEAR   Specific Gravity, Urine 1.024 1.005 - 1.030   pH 6.0 5.0 - 8.0   Glucose, UA NEGATIVE NEGATIVE mg/dL   Hgb urine dipstick SMALL (A) NEGATIVE   Bilirubin Urine NEGATIVE NEGATIVE   Ketones, ur NEGATIVE NEGATIVE mg/dL   Protein, ur NEGATIVE NEGATIVE mg/dL   Nitrite NEGATIVE NEGATIVE   Leukocytes,Ua NEGATIVE NEGATIVE   RBC / HPF 0-5 0 - 5 RBC/hpf   WBC, UA 0-5 0 - 5 WBC/hpf   Bacteria, UA RARE (A) NONE SEEN   Squamous Epithelial / LPF 0-5 0 - 5   Mucus PRESENT    No results  found.  Radiology CT ABDOMEN PELVIS W CONTRAST  Result Date: 10/21/2019 CLINICAL DATA:  Abdominal pain dark urine and dark stool EXAM: CT ABDOMEN AND PELVIS WITH CONTRAST TECHNIQUE: Multidetector CT imaging of the abdomen and pelvis was performed using the standard protocol following bolus administration of intravenous contrast. CONTRAST:  158m OMNIPAQUE IOHEXOL 300 MG/ML  SOLN COMPARISON:  October 15, 2019 FINDINGS: Lower chest: The visualized heart size within normal limits. No pericardial fluid/thickening. No hiatal hernia. Centrilobular emphysematous changes are seen predominantly at the right lung base. There is also a focal area of bronchiectasis with scarring in the medial right lung base. Hepatobiliary: The liver is normal in density without focal abnormality.The main portal vein is patent. No evidence of calcified gallstones, gallbladder wall thickening or biliary dilatation. Pancreas: Unremarkable. No pancreatic ductal dilatation or surrounding inflammatory changes. Spleen: Normal in size without focal abnormality. Adrenals/Urinary Tract: Both adrenal glands appear normal. The kidneys and collecting system appear normal without evidence of urinary tract calculus or hydronephrosis. Mild diffuse bladder wall thickening seen predominantly superiorly. No significant fat stranding changes however are seen. Stomach/Bowel: Lung the posterior gastric fundus, series 3, image 16 there is a gastric diverticulum with fluid and air measuring up to 3.1  cm. There also appears to be mild wall thickening of the mid body of the stomach with question small area of ulceration/diverticula, series 3, image 37. There is improved appearance of the mild wall thickening seen within the terminal ileum in comparison to the prior exam. Scattered colonic diverticula are noted without diverticulitis. Vascular/Lymphatic: There are no enlarged mesenteric, retroperitoneal, or pelvic lymph nodes. Scattered aortic atherosclerotic  calcifications are seen without aneurysmal dilatation. Reproductive: The prostate is unremarkable. Other: No evidence of abdominal wall mass or hernia. Musculoskeletal: No acute or significant osseous findings. IMPRESSION: 1. Mild wall thickening of the mid body of the stomach, which may be due to mild gastritis or underdistention, with a small gastric ulcer/diverticula. 2. Improved appearance of the mild wall thickening of the terminal ileum. 3. Mild diffuse wall thickening of the bladder which may be due to cystitis. A 4. Diverticulosis without diverticulitis. 5.  Aortic Atherosclerosis (ICD10-I70.0). Electronically Signed   By: Prudencio Pair M.D.   On: 10/21/2019 02:29    Procedures Procedures (including critical care time)  Medications Ordered in ED Medications  sodium chloride flush (NS) 0.9 % injection 3 mL (3 mLs Intravenous Not Given 10/21/19 0018)  amoxicillin-clavulanate (AUGMENTIN) 875-125 MG per tablet 1 tablet (has no administration in time range)  sucralfate (CARAFATE) 1 GM/10ML suspension 1 g (has no administration in time range)  alum & mag hydroxide-simeth (MAALOX/MYLANTA) 200-200-20 MG/5ML suspension 30 mL (30 mLs Oral Given 10/21/19 0220)    And  lidocaine (XYLOCAINE) 2 % viscous mouth solution 15 mL (15 mLs Oral Given 10/21/19 0220)  iohexol (OMNIPAQUE) 300 MG/ML solution 100 mL (100 mLs Intravenous Contrast Given 10/21/19 0202)  haloperidol lactate (HALDOL) injection 2 mg (2 mg Intravenous Given 10/21/19 0304)    ED Course  I have reviewed the triage vital signs and the nursing notes.  Pertinent labs & imaging results that were available during my care of the patient were reviewed by me and considered in my medical decision making (see chart for details).    Well appearing, no emesis in the ED.  PO challenged successfully.  Not in DTs, not withdrawing from substances.  Will start augmentin and carafate and a PPI.  Stop using all drugs and alcohol.  Follow up with GI as an  outpatient.  Strict return precautions given.    Final Clinical Impression(s) / ED Diagnoses Return for weakness, numbness, changes in vision or speech, fevers >100.4 unrelieved by medication, shortness of breath, intractable vomiting, or diarrhea, abdominal pain, Inability to tolerate liquids or food, cough, altered mental status or any concerns. No signs of systemic illness or infection. The patient is nontoxic-appearing on exam and vital signs are within normal limits.   I have reviewed the triage vital signs and the nursing notes. Pertinent labs &imaging results that were available during my care of the patient were reviewed by me and considered in my medical decision making (see chart for details).  After history, exam, and medical workup I feel the patient has been appropriately medically screened and is safe for discharge home. Pertinent diagnoses were discussed with the patient. Patient was given return precautions   Christyne Mccain, MD 10/21/19 [place], Custer Pimenta, MD 12/26/19 1727

## 2019-10-21 NOTE — Discharge Instructions (Addendum)
No alcohol

## 2019-10-26 ENCOUNTER — Other Ambulatory Visit: Payer: Self-pay | Admitting: Radiation Oncology

## 2019-10-26 MED ORDER — OXYCODONE-ACETAMINOPHEN 5-325 MG PO TABS: 1.0000 | ORAL_TABLET | ORAL | 0 refills | Status: DC | PRN

## 2019-11-21 ENCOUNTER — Telehealth: Payer: Self-pay | Admitting: *Deleted

## 2019-11-21 NOTE — Telephone Encounter (Signed)
CALLED PATIENT TO INFORM OF FU APPT. ON 11-28-19 @ 10:30 AM WITH DR. KINARD, LVM FOR A RETURN CALL

## 2019-11-22 ENCOUNTER — Other Ambulatory Visit: Payer: Self-pay | Admitting: Radiation Oncology

## 2019-11-22 MED ORDER — OXYCODONE-ACETAMINOPHEN 5-325 MG PO TABS: 1.0000 | ORAL_TABLET | ORAL | 0 refills | Status: DC | PRN

## 2019-11-27 NOTE — Progress Notes (Signed)
[name]   Radiation Oncology         [phone]) [phone] ________________________________  Name: Martin [name] MRN: [id number]  Date: 11/28/2019  DOB: 12/01/1960  Follow-Up Visit Note  CC: Patient, No Pcp Per  Armandina Gemma, MD    ICD-10-CM   1. Anal cancer ([place])  C21.0 Urinalysis, Complete w Microscopic    Urine culture    PSA    Diagnosis:  Anal cancer  Interval Since Last Radiation: Seven years, ten months, two weeks, and six days.  11/19/2011 - 01/08/2012: 50.4 Gy in 28 fractions  Narrative: The patient returns today for follow-up. Since his last visit, he was seen in the ED on 10/15/2019 with complaints of abdominal pain, nausea, and vomiting. CT of abdomen/pelvis at that time showed a 7 - 10 cm segment of terminal ileum with circumferential wall thickening and wall edema without substantial perienteric edema or inflammation. Results were compatible with an infectious/inflammatory enteritis. There was also a new 8 x 8 mm nodular component to some scarring in the medial right lung base, which may have been related to scarring or infection/inflammation. He was admitted to the hospital for further evaluation and treatment and was discharged on 10/18/2019.  He was seen in the ED again on 10/19/2019 with chief complaint of abdominal pain. CT of abdomen/pelvis at that time showed mild wall thickening of the mid body of the stomach possibly due to mild gastritis or under-distention, with a small gastric ulcer/diverticula. There was an improved appearance of the mild wall thickening of the terminal ileum. Finally, it showed mild diffuse wall thickening of the bladder, which may have been due to cystitis. The patient was prescribed Augmentin, Carafate, and a PPI and was discharged.  On review of systems, he reports dysuria, dark colored urine, urinary urgency, and urinary frequency. He also reports having to strain to urinate and pain at the site of "hernia mesh" for the last 1.5 to 2 months. He denies  fever.  ALLERGIES:  is allergic to yellow jacket venom [bee venom]; aspirin; codeine; and morphine and related.  Meds: Current Outpatient Medications  Medication Sig Dispense Refill  . oxyCODONE-acetaminophen (PERCOCET/ROXICET) 5-325 MG tablet Take 1 tablet by mouth every 4 (four) hours as needed for severe pain. 100 tablet 0  . amoxicillin-clavulanate (AUGMENTIN) 875-125 MG tablet Take 1 tablet by mouth 2 (two) times daily. One po bid x 7 days (Patient not taking: Reported on 11/28/2019) 14 tablet 0  . omeprazole (PRILOSEC) 20 MG capsule Take 1 capsule (20 mg total) by mouth daily. (Patient not taking: Reported on 11/28/2019) 30 capsule 0  . sucralfate (CARAFATE) 1 GM/10ML suspension Take 10 mLs (1 g total) by mouth 4 (four) times daily -  with meals and at bedtime. (Patient not taking: Reported on 11/28/2019) 420 mL 0   No current facility-administered medications for this encounter.    Physical Findings: The patient is in no acute distress. Patient is alert and oriented.  weight is 144 lb (65.3 kg). His temperature is 98 F (36.7 C). His blood pressure is 128/59 (abnormal) and his pulse is 57 (abnormal). His respiration is 20 and oxygen saturation is 100%.   No palpable supraclavicular or axillary adenopathy. Lungs are clear to auscultation. The heart has a regular rhythm and rate. The abdomen is soft and nontender with normal bowel sounds.  Examination of the inguinal area reveals no adenopathy. No masses palpated on testicular exam. On rectal exam, sphincter tone is normal. No palpable masses in the anal  canal or lower rectal area. Prostate mildly enlarged, no palpable nodules within the prostate.    Lab Findings: Lab Results  Component Value Date   WBC 15.5 (H) 10/20/2019   HGB 14.6 10/20/2019   HCT 44.3 10/20/2019   MCV 93.3 10/20/2019   PLT 259 10/20/2019    Impression/Plan: Anal Cancer There is no evidence of recurrence on clinical exam.  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  We will give him a trial of Flomax.  Warned patient about potential side effects such as dizziness with standing and that he cannot get out of bed quickly at night.  recommended he see a urologist. Urine Specimen obtained today to rule out infection.  We will also check PSA. patient is seeking out a primary medical care physician in the [place] near where he [place].  He has been approved for disability and has Medicaid now.  He continues to have chronic pain in the pelvis and will refill his Percocet when appropriate.   Plan: Routine follow-up in 6 months. -----------------------------------  Blair Promise, PhD, MD  This document serves as a record of services personally performed by Gery Pray, MD. It was created on his behalf by Clerance Lav, a trained medical scribe. The creation of this record is based on the scribe's personal observations and the provider's statements to them. This document has been checked and approved by the attending provider.

## 2019-11-28 ENCOUNTER — Ambulatory Visit: Payer: Medicaid Other | Attending: Radiation Oncology

## 2019-11-28 ENCOUNTER — Other Ambulatory Visit: Payer: Self-pay

## 2019-11-28 ENCOUNTER — Encounter: Payer: Self-pay | Admitting: Radiation Oncology

## 2019-11-28 ENCOUNTER — Ambulatory Visit
Admission: RE | Admit: 2019-11-28 | Discharge: 2019-11-28 | Disposition: A | Discharge status: Home / Self Care | Payer: Medicaid Other | Source: Ambulatory Visit | Attending: Radiation Oncology | Admitting: Radiation Oncology

## 2019-11-28 ENCOUNTER — Encounter (INDEPENDENT_AMBULATORY_CARE_PROVIDER_SITE_OTHER): Payer: Self-pay

## 2019-11-28 VITALS — BP 128/59 | HR 57 | Temp 98.0°F | Resp 20 | Wt 144.0 lb

## 2019-11-28 DIAGNOSIS — R3915 Urgency of urination: Secondary | ICD-10-CM | POA: Insufficient documentation

## 2019-11-28 DIAGNOSIS — C210 Malignant neoplasm of anus, unspecified: Secondary | ICD-10-CM | POA: Insufficient documentation

## 2019-11-28 DIAGNOSIS — R300 Dysuria: Secondary | ICD-10-CM | POA: Diagnosis not present

## 2019-11-28 DIAGNOSIS — Z923 Personal history of irradiation: Secondary | ICD-10-CM | POA: Diagnosis not present

## 2019-11-28 DIAGNOSIS — R102 Pelvic and perineal pain: Secondary | ICD-10-CM | POA: Insufficient documentation

## 2019-11-28 DIAGNOSIS — G8929 Other chronic pain: Secondary | ICD-10-CM | POA: Diagnosis not present

## 2019-11-28 DIAGNOSIS — Z79899 Other long term (current) drug therapy: Secondary | ICD-10-CM | POA: Diagnosis not present

## 2019-11-28 DIAGNOSIS — R350 Frequency of micturition: Secondary | ICD-10-CM | POA: Diagnosis not present

## 2019-11-28 MED ORDER — TAMSULOSIN HCL 0.4 MG PO CAPS: 0.4000 mg | ORAL_CAPSULE | Freq: Every day | ORAL | 1 refills | Status: DC

## 2019-11-28 NOTE — Progress Notes (Signed)
Mr. Mantell presents today for "prostate problems", including urinary pain and frequency, urgency. Pt reports urine was dark but has lightened up since has increased water intake. Pt having to strain to urinate. Pt reports pain at the site of "hernia mesh". Pt reports these concerns have been going on for 1.5 to 2 months.  BP (!) 128/59 (BP Location: Left Arm, Patient Position: Sitting, Cuff Size: Normal)   Pulse (!) 57   Temp 98 F (36.7 C)   Resp 20   Wt 144 lb (65.3 kg)   SpO2 100%   BMI 20.66 kg/m   Wt Readings from Last 3 Encounters:  11/28/19 144 lb (65.3 kg)  10/19/19 132 lb (59.9 kg)  10/18/19 132 lb 7.9 oz (60.1 kg)   Loma Sousa, RN BSN

## 2019-11-28 NOTE — Patient Instructions (Signed)
Coronavirus (COVID-19) Are you at risk?  Are you at risk for the Coronavirus (COVID-19)?  To be considered HIGH RISK for Coronavirus (COVID-19), you have to meet the following criteria:  . Traveled to [place], [place], [place], [place] or [place]; or in the [place] to [place], [place], [place], or [place]; and have fever, cough, and shortness of breath within the last 2 weeks of travel OR . Been in close contact with a person diagnosed with COVID-19 within the last 2 weeks and have fever, cough, and shortness of breath . IF YOU DO NOT MEET THESE CRITERIA, YOU ARE CONSIDERED LOW RISK FOR COVID-19.  What to do if you are HIGH RISK for COVID-19?  Marland Kitchen If you are having a medical emergency, call 911. . Seek medical care right away. Before you go to a doctor's office, urgent care or emergency department, call ahead and tell them about your recent travel, contact with someone diagnosed with COVID-19, and your symptoms. You should receive instructions from your physician's office regarding next steps of care.  . When you arrive at healthcare provider, tell the healthcare staff immediately you have returned from visiting [place], [place], [place], [place] or [place]; or traveled in the [place] to [place], [place], [place], or [place]; in the last two weeks or you have been in close contact with a person diagnosed with COVID-19 in the last 2 weeks.   . Tell the health care staff about your symptoms: fever, cough and shortness of breath. . After you have been seen by a medical provider, you will be either: o Tested for (COVID-19) and discharged home on quarantine except to seek medical care if symptoms worsen, and asked to  - Stay home and avoid contact with others until you get your results (4-5 days)  - Avoid travel on public transportation if possible (such as bus, train, or airplane) or o Sent to the Emergency Department by EMS for evaluation, COVID-19 testing, and possible  admission depending on your condition and test results.  What to do if you are LOW RISK for COVID-19?  Reduce your risk of any infection by using the same precautions used for avoiding the common cold or flu:  Marland Kitchen Wash your hands often with soap and warm water for at least 20 seconds.  If soap and water are not readily available, use an alcohol-based hand sanitizer with at least 60% alcohol.  . If coughing or sneezing, cover your mouth and nose by coughing or sneezing into the elbow areas of your shirt or coat, into a tissue or into your sleeve (not your hands). . Avoid shaking hands with others and consider head nods or verbal greetings only. . Avoid touching your eyes, nose, or mouth with unwashed hands.  . Avoid close contact with people who are sick. . Avoid places or events with large numbers of people in one location, like concerts or sporting events. . Carefully consider travel plans you have or are making. . If you are planning any travel outside or inside the [place], visit the CDC's Travelers' Health webpage for the latest health notices. . If you have some symptoms but not all symptoms, continue to monitor at home and seek medical attention if your symptoms worsen. . If you are having a medical emergency, call 911.   [place] / e-Visit: [email or web address]         [facility]: [place]  [facility]: [facility]: [phone]

## 2019-12-01 ENCOUNTER — Ambulatory Visit: Payer: Self-pay | Admitting: Radiation Oncology

## 2019-12-16 ENCOUNTER — Ambulatory Visit: Payer: Medicaid Other | Attending: Internal Medicine

## 2019-12-22 ENCOUNTER — Other Ambulatory Visit: Payer: Self-pay | Admitting: Radiation Oncology

## 2019-12-22 MED ORDER — OXYCODONE-ACETAMINOPHEN 5-325 MG PO TABS: 1.0000 | ORAL_TABLET | ORAL | 0 refills | Status: DC | PRN

## 2020-01-18 ENCOUNTER — Other Ambulatory Visit: Payer: Self-pay | Admitting: Radiation Oncology

## 2020-01-18 MED ORDER — OXYCODONE-ACETAMINOPHEN 5-325 MG PO TABS: 1.0000 | ORAL_TABLET | ORAL | 0 refills | Status: DC | PRN

## 2020-01-18 MED ORDER — TAMSULOSIN HCL 0.4 MG PO CAPS: 0.4000 mg | ORAL_CAPSULE | Freq: Every day | ORAL | 1 refills | Status: DC

## 2020-02-08 ENCOUNTER — Ambulatory Visit (HOSPITAL_COMMUNITY)
Admission: EM | Admit: 2020-02-08 | Discharge: 2020-02-08 | Disposition: A | Discharge status: Home / Self Care | Payer: Medicaid Other | Attending: Emergency Medicine | Admitting: Emergency Medicine

## 2020-02-08 ENCOUNTER — Encounter (HOSPITAL_COMMUNITY): Payer: Self-pay

## 2020-02-08 ENCOUNTER — Other Ambulatory Visit: Payer: Self-pay

## 2020-02-08 DIAGNOSIS — G8929 Other chronic pain: Secondary | ICD-10-CM | POA: Diagnosis not present

## 2020-02-08 DIAGNOSIS — M5441 Lumbago with sciatica, right side: Secondary | ICD-10-CM

## 2020-02-08 DIAGNOSIS — M5431 Sciatica, right side: Secondary | ICD-10-CM

## 2020-02-08 MED ORDER — METHYLPREDNISOLONE SODIUM SUCC 125 MG IJ SOLR
125.0000 mg | Freq: Once | INTRAMUSCULAR | Status: AC
  Administered 2020-02-08: 125 mg via INTRAMUSCULAR

## 2020-02-08 MED ORDER — METHYLPREDNISOLONE SODIUM SUCC 125 MG IJ SOLR
INTRAMUSCULAR | Status: AC
  Filled 2020-02-08: qty 2

## 2020-02-08 NOTE — ED Provider Notes (Signed)
[place]    CSN: [id number] Arrival date & time: 02/08/20  1739      History   Chief Complaint Chief Complaint  Patient presents with   Back Pain    HPI Martin [name] is a 59 y.o. male.   Pt has chronic back pain and other pain due to his cancer. States that he has reported this low back pain and sciatic several times but nothing as been done. Would like to see a specialist. Asking for pain meds  expressed that he would need to see his pcp for this we can give a shot to help with inflammation today. Has already taken his oxycodone with no relief.      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    Anxiety    Arthritis    DDD lumbar, arthritis knees   DJD (degenerative joint disease) of lumbar spine    GERD (gastroesophageal reflux disease)    Hemorrhoid    internal   History of bowel resection    Hypertension    EKG 12/12 EPIC   no  PCP- states increased lately but hasnt been diagnosed formally   Inguinal hernia    Pneumonia    as child, cough at present with no fever   Pneumonia    as child, cough at present with no fever    Radiation 11/19/11-01/08/12   5040 cGy 28 fx Pelvis and inguinal area    Patient Active Problem List   Diagnosis Date Noted   Enteritis 10/15/2019   Abnormal liver function 10/15/2019   Leukocytosis 10/15/2019   Nausea & vomiting 10/15/2019   GAD (generalized anxiety disorder) 05/18/2017   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   Constipation 04/11/2017   Bee sting 04/05/2017   Diverticulosis 11/29/2014   Healthcare maintenance 11/06/2014   Cough 10/05/2014   Insomnia 09/13/2014   Erectile dysfunction 11/30/2013   Chronic pain syndrome 11/30/2013   Depression 11/30/2013   GERD (gastroesophageal reflux disease)    Hypertension    Anxiety    Arthritis    DJD (degenerative joint disease) of lumbar spine     Radiation    Night sweats 08/12/2012   Tobacco abuse counseling 05/26/2012   Lymphocytic colitis 05/03/2012   B12 nutritional deficiency 03/19/2012   Folate deficiency 03/19/2012   Inguinal hernia 11/24/2011   Cancer ([place]) 10/14/2011   Internal hemorrhoid 09/11/2011   Anal cancer ([place]) 10/09/2010    Past Surgical History:  Procedure Laterality Date   APPENDECTOMY     age 68 or 6   EXAMINATION UNDER ANESTHESIA  10/14/2011   Procedure: EXAM UNDER ANESTHESIA;  Surgeon: Earnstine Regal, MD;  Location: [facility];  Service: General;  Laterality: N/A;  exam under anethesia, Excision of mass anal canal, 1.5cm   INGUINAL HERNIA REPAIR  05/11/2012   Procedure: HERNIA REPAIR INGUINAL ADULT;  Surgeon: Earnstine Regal, MD;  Location: [place];  Service: General;  Laterality: Left;  left inguinal hernia repair with mesh   surgical pathology   10/14/2011   squamous cell ca of anus   transanal excision  10/14/2011   Dr.Todd Gerkin       Home Medications    Prior to Admission medications   Medication Sig Start Date End Date Taking? Authorizing Provider  amoxicillin-clavulanate (AUGMENTIN) 875-125 MG tablet Take 1 tablet by mouth 2 (two) times daily. One po  bid x 7 days Patient not taking: Reported on 11/28/2019 10/21/19   Palumbo, April, MD  omeprazole (PRILOSEC) 20 MG capsule Take 1 capsule (20 mg total) by mouth daily. Patient not taking: Reported on 11/28/2019 10/21/19   Randal Buba, April, MD  oxyCODONE-acetaminophen (PERCOCET/ROXICET) 5-325 MG tablet Take 1 tablet by mouth every 4 (four) hours as needed for severe pain. 01/18/20   Gery Pray, MD  sucralfate (CARAFATE) 1 GM/10ML suspension Take 10 mLs (1 g total) by mouth 4 (four) times daily -  with meals and at bedtime. Patient not taking: Reported on 11/28/2019 10/21/19   Palumbo, April, MD  tamsulosin (FLOMAX) 0.4 MG CAPS capsule Take 1 capsule (0.4 mg total) by mouth at bedtime. 01/18/20   Gery Pray, MD    Family  History Family History  Problem Relation Age of Onset   Colon cancer Father    Asthma Father    Cancer Father        prostate   Hypertension Father    Asthma Mother    Hypertension Mother    Stomach cancer Neg Hx    Esophageal cancer Neg Hx     Social History Social History   Tobacco Use   Smoking status: Current Every Day Smoker    Packs/day: 1.25    Years: 47.00    Pack years: 58.75    Types: Cigarettes   Smokeless tobacco: Never Used   Tobacco comment: Cutting back>> Quit Smart card supplied  Substance Use Topics   Alcohol use: Yes    Alcohol/week: 6.0 standard drinks    Types: 6 Cans of beer per week    Comment: weekend    6pk weekend, beer    Drug use: Yes    Types: Marijuana, Cocaine    Comment: marijuana in past, occasionally now-last use 3-4 weeks ago     Allergies   Yellow jacket venom [bee venom], Aspirin, Codeine, and Morphine and related   Review of Systems Review of Systems  Constitutional: Negative.   Respiratory: Negative.   Cardiovascular: Negative.   Gastrointestinal: Positive for abdominal pain.       Chronic abd pain due to ca no urinary sx is on flomax   Musculoskeletal: Positive for back pain.       Pain radates down rt leg intermit   Neurological: Positive for numbness.       Down rt leg intermit with standing      Physical Exam Triage Vital Signs ED Triage Vitals  Enc Vitals Group     BP 02/08/20 1809 (!) 146/64     Pulse Rate 02/08/20 1809 74     Resp 02/08/20 1809 20     Temp 02/08/20 1809 98.4 F (36.9 C)     Temp Source 02/08/20 1809 Oral     SpO2 02/08/20 1809 100 %     Weight --      Height --      Head Circumference --      Peak Flow --      Pain Score 02/08/20 1833 9     Pain Loc --      Pain Edu? --      Excl. in [place]? --    No data found.  Updated Vital Signs BP (!) 146/64 (BP Location: Left Arm)    Pulse 74    Temp 98.4 F (36.9 C) (Oral)    Resp 20    SpO2 100%   Visual Acuity     Physical  Exam Cardiovascular:  Rate and Rhythm: Normal rate.  Pulmonary:     Effort: Pulmonary effort is normal.  Musculoskeletal:        General: Tenderness present.     Comments: Pain with extension to lower back radiates to rt leg intermit. Walked in room. No injury   Skin:    General: Skin is warm.     Capillary Refill: Capillary refill takes 2 to 3 seconds.  Neurological:     Mental Status: He is alert.      UC Treatments / Results  Labs (all labs ordered are listed, but only abnormal results are displayed) Labs Reviewed - No data to display  EKG   Radiology No results found.  Procedures Procedures (including critical care time)  Medications Ordered in UC Medications  methylPREDNISolone sodium succinate (SOLU-MEDROL) 125 mg/2 mL injection 125 mg (has no administration in time range)    Initial Impression / Assessment and Plan / UC Course  I have reviewed the triage vital signs and the nursing notes.  Pertinent labs & imaging results that were available during my care of the patient were reviewed by me and considered in my medical decision making (see chart for details).     You can call to follow up with orthopedic since this is something that has been going on for a while We can give something to help with the inflammation but you will need to follow up with your doctor for any futher medications  Reviewed hx of chronic pain and cancer  Final Clinical Impressions(s) / UC Diagnoses   Final diagnoses:  Chronic midline low back pain with right-sided sciatica  Sciatica of right side     Discharge Instructions     You can call to follow up with orthopedic since this is something that has been going on for a while We can give something to help with the inflammation but you will need to follow up with your doctor for any futher medications      ED Prescriptions    None     PDMP not reviewed this encounter.   Marney Setting, NP 02/08/20 [phone]

## 2020-02-08 NOTE — Discharge Instructions (Addendum)
You can call to follow up with orthopedic since this is something that has been going on for a while We can give something to help with the inflammation but you will need to follow up with your doctor for any futher medications

## 2020-02-08 NOTE — ED Triage Notes (Signed)
Pt presents with recurrent back pain that progresses with different movements, bending over, or climbing stairs.

## 2020-02-16 ENCOUNTER — Other Ambulatory Visit: Payer: Self-pay | Admitting: Radiation Oncology

## 2020-02-16 MED ORDER — OXYCODONE-ACETAMINOPHEN 5-325 MG PO TABS: 1.0000 | ORAL_TABLET | ORAL | 0 refills | Status: DC | PRN

## 2020-02-17 ENCOUNTER — Telehealth: Payer: Self-pay

## 2020-02-17 NOTE — Telephone Encounter (Signed)
Returned pt's call asking for his pain med refill. LVM that Dr. Sondra Come was to send the refills to his pharmacy yesterday. Advised to call office if there was a problem.

## 2020-02-23 ENCOUNTER — Emergency Department (HOSPITAL_COMMUNITY)
Admission: EM | Admit: 2020-02-23 | Discharge: 2020-02-23 | Disposition: A | Discharge status: Home / Self Care | Payer: Medicaid Other | Attending: Emergency Medicine | Admitting: Emergency Medicine

## 2020-02-23 ENCOUNTER — Other Ambulatory Visit: Payer: Self-pay

## 2020-02-23 DIAGNOSIS — I10 Essential (primary) hypertension: Secondary | ICD-10-CM | POA: Diagnosis not present

## 2020-02-23 DIAGNOSIS — T782XXA Anaphylactic shock, unspecified, initial encounter: Secondary | ICD-10-CM | POA: Diagnosis not present

## 2020-02-23 DIAGNOSIS — Z85048 Personal history of other malignant neoplasm of rectum, rectosigmoid junction, and anus: Secondary | ICD-10-CM | POA: Insufficient documentation

## 2020-02-23 DIAGNOSIS — F17210 Nicotine dependence, cigarettes, uncomplicated: Secondary | ICD-10-CM | POA: Insufficient documentation

## 2020-02-23 DIAGNOSIS — R0989 Other specified symptoms and signs involving the circulatory and respiratory systems: Secondary | ICD-10-CM | POA: Diagnosis present

## 2020-02-23 MED ORDER — EPINEPHRINE 0.3 MG/0.3ML IJ SOAJ: 0.3000 mg | INTRAMUSCULAR | 0 refills | Status: DC | PRN

## 2020-02-23 MED ORDER — EPINEPHRINE 0.3 MG/0.3ML IJ SOAJ
0.3000 mg | Freq: Once | INTRAMUSCULAR | Status: DC
  Filled 2020-02-23: qty 0.3

## 2020-02-23 MED ORDER — KETOROLAC TROMETHAMINE 30 MG/ML IJ SOLN
30.0000 mg | Freq: Once | INTRAMUSCULAR | Status: AC
  Administered 2020-02-23: 30 mg via INTRAVENOUS
  Filled 2020-02-23: qty 1

## 2020-02-23 MED ORDER — PREDNISONE 20 MG PO TABS: 40.0000 mg | ORAL_TABLET | Freq: Every day | ORAL | 0 refills | Status: AC

## 2020-02-23 NOTE — Discharge Instructions (Signed)
You have been seen in the Emergency Department (ED) today for an allergic reaction.  You have been stable throughout your stay in the Emergency Department.  Please take your medications as prescribed and follow up with your doctor as indicated.  You should also take over-the-counter Benadryl around the clock for the next three days according to the dosing instructions on the package.  Please keep your Epi-Pen with you at all times and use it if experience shortness of breath or difficulty breathing or if you believe you are having a severe allergic reaction.  If you use the Epi-Pen, though, please call 911 afterwards or go immediately to your nearest Emergency Department.  Return to the Emergency Department (ED) if you experience any worsening or new symptoms that concern you.

## 2020-02-23 NOTE — ED Triage Notes (Signed)
Pt bib ems from MD office with allergic reaction to wasps. Pt had feeling of throat tightness, urticaria to most of his body. Known hx of allergy to bees. Pt given 132m solu medrol IM and 163mepi IM by MD office. given 5043mV benadyl, 4mg32m zofran, 5mg 2muterol, 0.5mg a43mvent by ems.. pt in NAD upon arrival.

## 2020-02-23 NOTE — ED Provider Notes (Signed)
Emergency Department Provider Note   I have reviewed the triage vital signs and the nursing notes.   HISTORY  Chief Complaint Allergic Reaction   HPI Martin [name] is a 59 y.o. male with past medical history reviewed below presents to the emergency department with anaphylaxis type reaction to wasp sting.  Patient was working outside when he was stung multiple times by wasps.  He began to get diffuse itchy rash along with throat tightness.  He presented to urgent care by private vehicle and was ultimately redirected to the emergency department after receiving epinephrine, Solu-Medrol, Benadryl, Zofran.  He was given albuterol and Atrovent by EMS as well.  They arrived on scene to transport him to the emergency department.  Patient does have a history of allergic reaction to bee stings in the past.  After the above medications he is feeling much better.  He denies any throat tightness or shortness of breath.  The rash is resolved.  He is not experiencing abdominal pain, vomiting, diarrhea.   Past Medical History:  Diagnosis Date  . Allergy 2 2013  . 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 Anxiety   . Arthritis    DDD lumbar, arthritis knees  . DJD (degenerative joint disease) of lumbar spine   . GERD (gastroesophageal reflux disease)   . Hemorrhoid    internal  . History of bowel resection   . Hypertension    EKG 12/12 EPIC   no  PCP- states increased lately but hasnt been diagnosed formally  . Inguinal hernia   . Pneumonia    as child, cough at present with no fever  . Pneumonia    as child, cough at present with no fever   . Radiation 11/19/11-01/08/12   5040 cGy 28 fx Pelvis and inguinal area    Patient Active Problem List   Diagnosis Date Noted  . Enteritis 10/15/2019  . Abnormal liver function 10/15/2019  . Leukocytosis 10/15/2019  . Nausea & vomiting 10/15/2019  . GAD (generalized anxiety disorder) 05/18/2017  . Radiation  enteritis 04/19/2017  . Constipation 04/11/2017  . Bee sting 04/05/2017  . Diverticulosis 11/29/2014  . Healthcare maintenance 11/06/2014  . Cough 10/05/2014  . Insomnia 09/13/2014  . Erectile dysfunction 11/30/2013  . Chronic pain syndrome 11/30/2013  . Depression 11/30/2013  . GERD (gastroesophageal reflux disease)   . Hypertension   . Anxiety   . Arthritis   . DJD (degenerative joint disease) of lumbar spine   . Radiation   . Night sweats 08/12/2012  . Tobacco abuse counseling 05/26/2012  . 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 B12 nutritional deficiency 03/19/2012  . Folate deficiency 03/19/2012  . Inguinal hernia 11/24/2011  . Cancer ([place]) 10/14/2011  . Internal hemorrhoid 09/11/2011  . Anal cancer ([place]) 10/09/2010    Past Surgical History:  Procedure Laterality Date  . APPENDECTOMY     age 38 or 35  . EXAMINATION UNDER ANESTHESIA  10/14/2011   Procedure: EXAM UNDER ANESTHESIA;  Surgeon: Earnstine Regal, MD;  Location: [facility];  Service: General;  Laterality: N/A;  exam under anethesia, Excision of mass anal canal, 1.5cm  . INGUINAL HERNIA REPAIR  05/11/2012   Procedure: HERNIA REPAIR INGUINAL ADULT;  Surgeon: Earnstine Regal, MD;  Location: [facility];  Service: General;  Laterality: Left;  left inguinal hernia repair with mesh  . surgical pathology   10/14/2011   squamous cell ca of anus  .  transanal excision  10/14/2011   Dr.Todd Gerkin    Allergies Yellow jacket venom [bee venom], Aspirin, Codeine, and Morphine and related  Family History  Problem Relation Age of Onset  . Colon cancer Father   . Asthma Father   . Cancer Father        prostate  . Hypertension Father   . Asthma Mother   . Hypertension Mother   . Stomach cancer Neg Hx   . Esophageal cancer Neg Hx     Social History Social History   Tobacco Use  . Smoking status: Current Every Day Smoker    Packs/day: 1.25    Years: 47.00    Pack years: 58.75    Types: Cigarettes  . Smokeless  tobacco: Never Used  . Tobacco comment: Cutting back>> Quit Smart card supplied  Substance Use Topics  . Alcohol use: Yes    Alcohol/week: 6.0 standard drinks    Types: 6 Cans of beer per week    Comment: weekend    6pk weekend, beer   . Drug use: Yes    Types: Marijuana, Cocaine    Comment: marijuana in past, occasionally now-last use 3-4 weeks ago    Review of Systems  Constitutional: No fever/chills Eyes: No visual changes. ENT: No sore throat. Positive throat tightness.  Cardiovascular: Denies chest pain. Respiratory: Positive shortness of breath. Gastrointestinal: No abdominal pain. No nausea, no vomiting. No diarrhea. No constipation. Genitourinary: Negative for dysuria. Musculoskeletal: Negative for back pain. Left hand pain/swelling.  Skin: Rash earlier that has resolved.  Neurological: Negative for headaches, focal weakness or numbness.  10-point ROS otherwise negative.  ____________________________________________   PHYSICAL EXAM:  VITAL SIGNS: ED Triage Vitals [02/23/20 1112]  Enc Vitals Group     BP (!) 140/59     Pulse Rate 74     Resp 20     Temp (!) 97.4 F (36.3 C)     Temp Source Temporal     SpO2 100 %   Constitutional: Alert and oriented. Well appearing and in no acute distress. Eyes: Conjunctivae are normal. Head: Atraumatic. Nose: No congestion/rhinnorhea. Mouth/Throat: Mucous membranes are moist.  Oropharynx is widely patent.  Patient speaking in a clear voice and managing oral secretions.  Neck: No stridor.  Cardiovascular: Normal rate, regular rhythm. Good peripheral circulation. Grossly normal heart sounds.   Respiratory: Normal respiratory effort.  No retractions. Lungs CTAB. Gastrointestinal: Soft and nontender. No distention.  Musculoskeletal: No gross deformities of extremities. Neurologic:  Normal speech and language.  Skin:  Skin is warm, dry and intact.  No urticarial rash visible.  Patient does have swelling in the left hand with  mild erythema there as well as in the left arm   ____________________________________________   PROCEDURES  Procedure(s) performed:   Procedures  None ____________________________________________   INITIAL IMPRESSION / ASSESSMENT AND PLAN / ED COURSE  Pertinent labs & imaging results that were available during my care of the patient were reviewed by me and considered in my medical decision making (see chart for details).   Patient presents after severe allergic reaction. EpiPen and steroids given PTA. Patient significantly improved. He was observed in the ED with no rebound symptoms. Patient requesting to be discharged slightly early because he left his tools in the yard and is concerned that someone will take them. Discussed rebound reaction and EpiPen provided to patient at discharge along with strict return precautions and instruction on EpiPen use.    ____________________________________________  FINAL CLINICAL IMPRESSION(S) /  ED DIAGNOSES  Final diagnoses:  Anaphylaxis, initial encounter     MEDICATIONS GIVEN DURING THIS VISIT:  Medications  ketorolac (TORADOL) 30 MG/ML injection 30 mg (30 mg Intravenous Given 02/23/20 1206)     NEW OUTPATIENT MEDICATIONS STARTED DURING THIS VISIT:  Discharge Medication List as of 02/23/2020  1:01 PM    START taking these medications   Details  EPINEPHrine (EPIPEN 2-PAK) 0.3 mg/0.3 mL IJ SOAJ injection Inject 0.3 mLs (0.3 mg total) into the muscle as needed for anaphylaxis., Starting Thu 02/23/2020, Normal    predniSONE (DELTASONE) 20 MG tablet Take 2 tablets (40 mg total) by mouth daily for 3 days., Starting Fri 02/24/2020, Until Mon 02/27/2020, Normal        Note:  This document was prepared using Dragon voice recognition software and may include unintentional dictation errors.  Nanda Quinton, MD, [facility] Emergency Medicine    Thayer Inabinet, [facility], MD 02/26/20 [phone]

## 2020-02-23 NOTE — ED Notes (Signed)
Patient Alert and oriented to baseline. Stable and ambulatory to baseline. Patient verbalized understanding of the discharge instructions.  Patient belongings were taken by the patient.   

## 2020-03-08 ENCOUNTER — Encounter (HOSPITAL_COMMUNITY): Payer: Self-pay | Admitting: Emergency Medicine

## 2020-03-08 ENCOUNTER — Inpatient Hospital Stay (HOSPITAL_COMMUNITY)
Admission: EM | Admit: 2020-03-08 | Discharge: 2020-03-09 | DRG: 389 | Discharge status: Against Medical Advice | Payer: Medicaid Other | Attending: Internal Medicine | Admitting: Internal Medicine

## 2020-03-08 ENCOUNTER — Other Ambulatory Visit: Payer: Self-pay

## 2020-03-08 ENCOUNTER — Inpatient Hospital Stay (HOSPITAL_COMMUNITY): Payer: Medicaid Other

## 2020-03-08 ENCOUNTER — Emergency Department (HOSPITAL_COMMUNITY): Payer: Medicaid Other

## 2020-03-08 DIAGNOSIS — F329 Major depressive disorder, single episode, unspecified: Secondary | ICD-10-CM | POA: Diagnosis present

## 2020-03-08 DIAGNOSIS — Z0189 Encounter for other specified special examinations: Secondary | ICD-10-CM

## 2020-03-08 DIAGNOSIS — I745 Embolism and thrombosis of iliac artery: Secondary | ICD-10-CM | POA: Diagnosis present

## 2020-03-08 DIAGNOSIS — Z8249 Family history of ischemic heart disease and other diseases of the circulatory system: Secondary | ICD-10-CM | POA: Diagnosis not present

## 2020-03-08 DIAGNOSIS — Z886 Allergy status to analgesic agent status: Secondary | ICD-10-CM

## 2020-03-08 DIAGNOSIS — J439 Emphysema, unspecified: Secondary | ICD-10-CM | POA: Diagnosis present

## 2020-03-08 DIAGNOSIS — R109 Unspecified abdominal pain: Secondary | ICD-10-CM

## 2020-03-08 DIAGNOSIS — Z800 Family history of malignant neoplasm of digestive organs: Secondary | ICD-10-CM | POA: Diagnosis not present

## 2020-03-08 DIAGNOSIS — K565 Intestinal adhesions [bands], unspecified as to partial versus complete obstruction: Secondary | ICD-10-CM | POA: Diagnosis present

## 2020-03-08 DIAGNOSIS — K219 Gastro-esophageal reflux disease without esophagitis: Secondary | ICD-10-CM | POA: Diagnosis present

## 2020-03-08 DIAGNOSIS — Z885 Allergy status to narcotic agent status: Secondary | ICD-10-CM | POA: Diagnosis not present

## 2020-03-08 DIAGNOSIS — Z85048 Personal history of other malignant neoplasm of rectum, rectosigmoid junction, and anus: Secondary | ICD-10-CM

## 2020-03-08 DIAGNOSIS — M1990 Unspecified osteoarthritis, unspecified site: Secondary | ICD-10-CM | POA: Diagnosis present

## 2020-03-08 DIAGNOSIS — Z681 Body mass index (BMI) 19 or less, adult: Secondary | ICD-10-CM

## 2020-03-08 DIAGNOSIS — Z20822 Contact with and (suspected) exposure to covid-19: Secondary | ICD-10-CM | POA: Diagnosis present

## 2020-03-08 DIAGNOSIS — I10 Essential (primary) hypertension: Secondary | ICD-10-CM | POA: Diagnosis present

## 2020-03-08 DIAGNOSIS — R64 Cachexia: Secondary | ICD-10-CM | POA: Diagnosis present

## 2020-03-08 DIAGNOSIS — F17210 Nicotine dependence, cigarettes, uncomplicated: Secondary | ICD-10-CM | POA: Diagnosis present

## 2020-03-08 DIAGNOSIS — E538 Deficiency of other specified B group vitamins: Secondary | ICD-10-CM | POA: Diagnosis present

## 2020-03-08 DIAGNOSIS — R748 Abnormal levels of other serum enzymes: Secondary | ICD-10-CM | POA: Diagnosis present

## 2020-03-08 DIAGNOSIS — K5651 Intestinal adhesions [bands], with partial obstruction: Secondary | ICD-10-CM | POA: Diagnosis present

## 2020-03-08 DIAGNOSIS — R945 Abnormal results of liver function studies: Secondary | ICD-10-CM | POA: Diagnosis present

## 2020-03-08 DIAGNOSIS — F32A Depression, unspecified: Secondary | ICD-10-CM | POA: Diagnosis present

## 2020-03-08 DIAGNOSIS — F419 Anxiety disorder, unspecified: Secondary | ICD-10-CM | POA: Diagnosis present

## 2020-03-08 DIAGNOSIS — C210 Malignant neoplasm of anus, unspecified: Secondary | ICD-10-CM | POA: Diagnosis present

## 2020-03-08 DIAGNOSIS — G894 Chronic pain syndrome: Secondary | ICD-10-CM | POA: Diagnosis present

## 2020-03-08 DIAGNOSIS — Z9049 Acquired absence of other specified parts of digestive tract: Secondary | ICD-10-CM

## 2020-03-08 DIAGNOSIS — K56609 Unspecified intestinal obstruction, unspecified as to partial versus complete obstruction: Secondary | ICD-10-CM | POA: Diagnosis not present

## 2020-03-08 DIAGNOSIS — N400 Enlarged prostate without lower urinary tract symptoms: Secondary | ICD-10-CM | POA: Diagnosis present

## 2020-03-08 DIAGNOSIS — Z825 Family history of asthma and other chronic lower respiratory diseases: Secondary | ICD-10-CM | POA: Diagnosis not present

## 2020-03-08 DIAGNOSIS — K56600 Partial intestinal obstruction, unspecified as to cause: Secondary | ICD-10-CM

## 2020-03-08 LAB — COMPREHENSIVE METABOLIC PANEL
ALT: 232 U/L — ABNORMAL HIGH (ref 0–44)
AST: 129 U/L — ABNORMAL HIGH (ref 15–41)
Albumin: 4.1 g/dL (ref 3.5–5.0)
Alkaline Phosphatase: 129 U/L — ABNORMAL HIGH (ref 38–126)
Anion gap: 11 (ref 5–15)
BUN: 12 mg/dL (ref 8–23)
CO2: 24 mmol/L (ref 22–32)
Calcium: 9.5 mg/dL (ref 8.9–10.3)
Chloride: 98 mmol/L (ref 98–111)
Creatinine, Ser: 0.74 mg/dL (ref 0.61–1.24)
GFR calc Af Amer: >60 mL/min (ref >60)
GFR calc non Af Amer: >60 mL/min (ref >60)
Glucose, Bld: 106 mg/dL — ABNORMAL HIGH (ref 70–99)
Potassium: 4.4 mmol/L (ref 3.5–5.1)
Sodium: 133 mmol/L — ABNORMAL LOW (ref 135–145)
Total Bilirubin: 1.5 mg/dL — ABNORMAL HIGH (ref 0.3–1.2)
Total Protein: 8 g/dL (ref 6.5–8.1)

## 2020-03-08 LAB — URINALYSIS, ROUTINE W REFLEX MICROSCOPIC
Bilirubin Urine: NEGATIVE
Glucose, UA: NEGATIVE mg/dL
Hgb urine dipstick: NEGATIVE
Ketones, ur: 20 mg/dL — AB
Leukocytes,Ua: NEGATIVE
Nitrite: NEGATIVE
Protein, ur: NEGATIVE mg/dL
Specific Gravity, Urine: 1.024 (ref 1.005–1.030)
pH: 7 (ref 5.0–8.0)

## 2020-03-08 LAB — SARS CORONAVIRUS 2 BY RT PCR (HOSPITAL ORDER, PERFORMED IN ~~LOC~~ HOSPITAL LAB): SARS Coronavirus 2: NEGATIVE

## 2020-03-08 LAB — CBC WITH DIFFERENTIAL/PLATELET
Abs Immature Granulocytes: 0.08 10*3/uL — ABNORMAL HIGH (ref 0.00–0.07)
Basophils Absolute: 0.1 10*3/uL (ref 0.0–0.1)
Basophils Relative: 0 %
Eosinophils Absolute: 0 10*3/uL (ref 0.0–0.5)
Eosinophils Relative: 0 %
HCT: 44.5 % (ref 39.0–52.0)
Hemoglobin: 15.6 g/dL (ref 13.0–17.0)
Immature Granulocytes: 1 %
Lymphocytes Relative: 7 %
Lymphs Abs: 1 10*3/uL (ref 0.7–4.0)
MCH: 31.2 pg (ref 26.0–34.0)
MCHC: 35.1 g/dL (ref 30.0–36.0)
MCV: 89 fL (ref 80.0–100.0)
Monocytes Absolute: 0.7 10*3/uL (ref 0.1–1.0)
Monocytes Relative: 4 %
Neutro Abs: 13.8 10*3/uL — ABNORMAL HIGH (ref 1.7–7.7)
Neutrophils Relative %: 88 %
Platelets: 193 10*3/uL (ref 150–400)
RBC: 5 MIL/uL (ref 4.22–5.81)
RDW: 13.7 % (ref 11.5–15.5)
WBC: 15.7 10*3/uL — ABNORMAL HIGH (ref 4.0–10.5)
nRBC: 0 % (ref 0.0–0.2)

## 2020-03-08 LAB — I-STAT CHEM 8, ED
BUN: 11 mg/dL (ref 8–23)
Calcium, Ion: 1.05 mmol/L — ABNORMAL LOW (ref 1.15–1.40)
Chloride: 104 mmol/L (ref 98–111)
Creatinine, Ser: 0.4 mg/dL — ABNORMAL LOW (ref 0.61–1.24)
Glucose, Bld: 100 mg/dL — ABNORMAL HIGH (ref 70–99)
HCT: 39 % (ref 39.0–52.0)
Hemoglobin: 13.3 g/dL (ref 13.0–17.0)
Potassium: 3.6 mmol/L (ref 3.5–5.1)
Sodium: 138 mmol/L (ref 135–145)
TCO2: 21 mmol/L — ABNORMAL LOW (ref 22–32)

## 2020-03-08 LAB — ECHOCARDIOGRAM LIMITED
Height: 70 in
Weight: 2320 oz

## 2020-03-08 LAB — LIPASE, BLOOD: Lipase: 21 U/L (ref 11–51)

## 2020-03-08 LAB — TROPONIN I (HIGH SENSITIVITY)
Troponin I (High Sensitivity): 14 ng/L (ref <18)
Troponin I (High Sensitivity): 16 ng/L (ref <18)

## 2020-03-08 MED ORDER — HYDROMORPHONE HCL 1 MG/ML IJ SOLN
1.0000 mg | Freq: Once | INTRAMUSCULAR | Status: AC
  Administered 2020-03-08: 1 mg via INTRAVENOUS
  Filled 2020-03-08: qty 1

## 2020-03-08 MED ORDER — DOCUSATE SODIUM 100 MG PO CAPS
100.0000 mg | ORAL_CAPSULE | Freq: Two times a day (BID) | ORAL | Status: DC
  Administered 2020-03-08 – 2020-03-09 (×2): 100 mg via ORAL
  Filled 2020-03-08 (×3): qty 1

## 2020-03-08 MED ORDER — HYDRALAZINE HCL 20 MG/ML IJ SOLN
20.0000 mg | Freq: Once | INTRAMUSCULAR | Status: AC
  Administered 2020-03-08: 20 mg via INTRAVENOUS
  Filled 2020-03-08: qty 1

## 2020-03-08 MED ORDER — LORAZEPAM 2 MG/ML IJ SOLN
1.0000 mg | Freq: Once | INTRAMUSCULAR | Status: AC
  Administered 2020-03-08: 1 mg via INTRAVENOUS
  Filled 2020-03-08: qty 1

## 2020-03-08 MED ORDER — TAMSULOSIN HCL 0.4 MG PO CAPS
0.4000 mg | ORAL_CAPSULE | Freq: Every day | ORAL | Status: DC
  Filled 2020-03-08: qty 1

## 2020-03-08 MED ORDER — ACETAMINOPHEN 325 MG PO TABS: 650.0000 mg | ORAL_TABLET | Freq: Four times a day (QID) | ORAL | Status: DC | PRN

## 2020-03-08 MED ORDER — HYDRALAZINE HCL 20 MG/ML IJ SOLN: 10.0000 mg | Freq: Four times a day (QID) | INTRAMUSCULAR | Status: DC | PRN

## 2020-03-08 MED ORDER — PIPERACILLIN-TAZOBACTAM 3.375 G IVPB
3.3750 g | Freq: Three times a day (TID) | INTRAVENOUS | Status: DC
  Administered 2020-03-08 – 2020-03-09 (×3): 3.375 g via INTRAVENOUS
  Filled 2020-03-08 (×3): qty 50

## 2020-03-08 MED ORDER — SODIUM CHLORIDE 0.9 % IV SOLN: INTRAVENOUS | Status: DC

## 2020-03-08 MED ORDER — PANTOPRAZOLE SODIUM 40 MG PO TBEC
40.0000 mg | DELAYED_RELEASE_TABLET | Freq: Every day | ORAL | Status: DC
  Administered 2020-03-08 – 2020-03-09 (×2): 40 mg via ORAL
  Filled 2020-03-08 (×2): qty 1

## 2020-03-08 MED ORDER — SUCRALFATE 1 GM/10ML PO SUSP
1.0000 g | Freq: Three times a day (TID) | ORAL | Status: DC
  Administered 2020-03-08 – 2020-03-09 (×3): 1 g via ORAL
  Filled 2020-03-08 (×4): qty 10

## 2020-03-08 MED ORDER — MORPHINE SULFATE (PF) 4 MG/ML IV SOLN
4.0000 mg | Freq: Once | INTRAVENOUS | Status: AC
  Administered 2020-03-08: 4 mg via INTRAVENOUS
  Filled 2020-03-08: qty 1

## 2020-03-08 MED ORDER — ENOXAPARIN SODIUM 40 MG/0.4ML ~~LOC~~ SOLN
40.0000 mg | SUBCUTANEOUS | Status: DC
  Administered 2020-03-08: 40 mg via SUBCUTANEOUS
  Filled 2020-03-08: qty 0.4

## 2020-03-08 MED ORDER — ACETAMINOPHEN 650 MG RE SUPP: 650.0000 mg | Freq: Four times a day (QID) | RECTAL | Status: DC | PRN

## 2020-03-08 MED ORDER — HYDROMORPHONE HCL 1 MG/ML IJ SOLN
0.5000 mg | INTRAMUSCULAR | Status: DC | PRN
  Administered 2020-03-08 – 2020-03-09 (×4): 1 mg via INTRAVENOUS
  Filled 2020-03-08 (×5): qty 1

## 2020-03-08 MED ORDER — ONDANSETRON HCL 4 MG/2ML IJ SOLN
4.0000 mg | Freq: Once | INTRAMUSCULAR | Status: AC
  Administered 2020-03-08: 4 mg via INTRAVENOUS
  Filled 2020-03-08: qty 2

## 2020-03-08 MED ORDER — SODIUM CHLORIDE (PF) 0.9 % IJ SOLN
INTRAMUSCULAR | Status: AC
  Filled 2020-03-08: qty 50

## 2020-03-08 MED ORDER — SODIUM CHLORIDE 0.9 % IV BOLUS
1000.0000 mL | Freq: Once | INTRAVENOUS | Status: AC
  Administered 2020-03-08: 1000 mL via INTRAVENOUS

## 2020-03-08 MED ORDER — FOLIC ACID 1 MG PO TABS
1.0000 mg | ORAL_TABLET | Freq: Every day | ORAL | Status: DC
  Administered 2020-03-08 – 2020-03-09 (×2): 1 mg via ORAL
  Filled 2020-03-08 (×2): qty 1

## 2020-03-08 MED ORDER — SENNOSIDES-DOCUSATE SODIUM 8.6-50 MG PO TABS: 1.0000 | ORAL_TABLET | Freq: Every evening | ORAL | Status: DC | PRN

## 2020-03-08 MED ORDER — IOHEXOL 350 MG/ML SOLN
100.0000 mL | Freq: Once | INTRAVENOUS | Status: AC | PRN
  Administered 2020-03-08: 100 mL via INTRAVENOUS

## 2020-03-08 MED ORDER — THIAMINE HCL 100 MG PO TABS
100.0000 mg | ORAL_TABLET | Freq: Every day | ORAL | Status: DC
  Administered 2020-03-08 – 2020-03-09 (×2): 100 mg via ORAL
  Filled 2020-03-08 (×2): qty 1

## 2020-03-08 MED ORDER — PROMETHAZINE HCL 25 MG PO TABS
12.5000 mg | ORAL_TABLET | Freq: Four times a day (QID) | ORAL | Status: DC | PRN
  Administered 2020-03-08 – 2020-03-09 (×3): 12.5 mg via ORAL
  Filled 2020-03-08 (×3): qty 1

## 2020-03-08 NOTE — Progress Notes (Addendum)
  Echocardiogram 2D Echocardiogram was attempted, very limited image acquisition due to uncooperative patient.  Darlina Sicilian M 03/08/2020, 11:30 AM

## 2020-03-08 NOTE — ED Notes (Signed)
Transport called.

## 2020-03-08 NOTE — H&P (Addendum)
History and Physical    Martin [name] DXI:[id number] DOB: 1961-01-07 DOA: 03/08/2020  PCP: Patient, No Pcp Per   Patient coming from: Home   I have personally briefly reviewed patient's old medical records in [place]  Chief Complaint: Abdominal pain with nausea and vomiting  HPI: Martin [name] is a 59 y.o. male with medical history significant of anal cancer s/p resection, hypertension,  GERD presents in the emergency department with abdominal pain.  Patient is very distracted and a poor historian during exam.  He reports having intermittent abdominal pain for a month associated with intermittent constipation.  He describes abdominal pain was diffuse and has gotten worse yesterday associated with nausea and throwing up.  He has last bowel movement 3 days ago. He reports pain is 10/10 on the pain scale not getting better with pain medications.He reports taking Flomax for BPH  he is on disability,  he reports taking chronic pain medications following surgery.   ED Course: He was afebrile, tachycardic and hypertensive with normal respiratory rate.  Vitals: HR 112, BP 141/69, RR 16, Temp 98.40F, O2 sat 98% Labs: CBC: WBC 15.7, hemoglobin 15.6, hematocrit 44.5, MCV 89, platelet 193,, sodium 133, potassium 4.4, chloride 98, bicarb 24, glucose 106, BUN 12, creatinine 0.74, calcium 9.5, anion gap 11, alkaline phosphatase 129, albumin 4.1, lipase 21, AST 129, ALT 232, total protein 8.0. UA: 20 ketones otherwise unremarkable, CT chest abdomen and pelvis: There is no evidence of thoracic or abdominal aortic dissection or aneurysm.  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 Moderate focal stenosis is noted involving the proximal portion of the right external iliac artery. Mildly dilated small bowel loops are noted in the upper and left lower quadrant with possible transition zone seen in the ileum findings may represent some degree of enteritis but early or partial small bowel  obstruction is also considered.  Review of Systems: As per HPI otherwise 10 point review of systems negative.  Review of Systems  HENT: Negative.   Respiratory: Negative.   Cardiovascular: Negative.   Gastrointestinal: Positive for abdominal pain, constipation, nausea and vomiting.  Genitourinary: Negative.   Musculoskeletal: Negative.   Neurological: Negative.   Endo/Heme/Allergies: Negative.   Psychiatric/Behavioral: Negative.     Past Medical History:  Diagnosis Date  . Allergy 2 2013  . 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 Anxiety   . Arthritis    DDD lumbar, arthritis knees  . DJD (degenerative joint disease) of lumbar spine   . GERD (gastroesophageal reflux disease)   . Hemorrhoid    internal  . History of bowel resection   . Hypertension    EKG 12/12 EPIC   no  PCP- states increased lately but hasnt been diagnosed formally  . Inguinal hernia   . Pneumonia    as child, cough at present with no fever  . Pneumonia    as child, cough at present with no fever   . Radiation 11/19/11-01/08/12   5040 cGy 28 fx Pelvis and inguinal area    Past Surgical History:  Procedure Laterality Date  . APPENDECTOMY     age 82 or 89  . EXAMINATION UNDER ANESTHESIA  10/14/2011   Procedure: EXAM UNDER ANESTHESIA;  Surgeon: Earnstine Regal, MD;  Location: [facility];  Service: General;  Laterality: N/A;  exam under anethesia, Excision of mass anal canal, 1.5cm  . INGUINAL HERNIA REPAIR  05/11/2012  Procedure: HERNIA REPAIR INGUINAL ADULT;  Surgeon: Earnstine Regal, MD;  Location: [facility];  Service: General;  Laterality: Left;  left inguinal hernia repair with mesh  . surgical pathology   10/14/2011   squamous cell ca of anus  . transanal excision  10/14/2011   Dr.Todd Gerkin     reports that he has been smoking cigarettes. He has a 58.75 pack-year smoking history. He has never used smokeless tobacco. He reports current alcohol use of about 6.0  standard drinks of alcohol per week. He reports current drug use. Drugs: Marijuana and Cocaine.  Allergies  Allergen Reactions  . [place] Venom [Bee Venom] Anaphylaxis  . Aspirin Swelling  . Codeine Nausea Only  . Morphine And Related Itching    Family History  Problem Relation Age of Onset  . Colon cancer Father   . Asthma Father   . Cancer Father        prostate  . Hypertension Father   . Asthma Mother   . Hypertension Mother   . Stomach cancer Neg Hx   . Esophageal cancer Neg Hx      Family history reviewed and not pertinent   Prior to Admission medications   Medication Sig Start Date End Date Taking? Authorizing Provider  EPINEPHrine (EPIPEN 2-PAK) 0.3 mg/0.3 mL IJ SOAJ injection Inject 0.3 mLs (0.3 mg total) into the muscle as needed for anaphylaxis. 02/23/20  Yes Long, Wonda Olds, MD  oxyCODONE-acetaminophen (PERCOCET/ROXICET) 5-325 MG tablet Take 1 tablet by mouth every 4 (four) hours as needed for severe pain. 02/16/20  Yes Gery Pray, MD  amoxicillin-clavulanate (AUGMENTIN) 875-125 MG tablet Take 1 tablet by mouth 2 (two) times daily. One po bid x 7 days Patient not taking: Reported on 11/28/2019 10/21/19   Palumbo, April, MD  omeprazole (PRILOSEC) 20 MG capsule Take 1 capsule (20 mg total) by mouth daily. Patient not taking: Reported on 11/28/2019 10/21/19   Palumbo, April, MD  sucralfate (CARAFATE) 1 GM/10ML suspension Take 10 mLs (1 g total) by mouth 4 (four) times daily -  with meals and at bedtime. Patient not taking: Reported on 11/28/2019 10/21/19   Palumbo, April, MD  tamsulosin (FLOMAX) 0.4 MG CAPS capsule Take 1 capsule (0.4 mg total) by mouth at bedtime. Patient not taking: Reported on 03/08/2020 01/18/20   Gery Pray, MD    Physical Exam: Vitals:   03/08/20 1214 03/08/20 1215 03/08/20 1216 03/08/20 1217  BP:  (!) 166/63    Pulse: 95 93 85 84  Resp: (!) 26 20 (!) 23 (!) 29  Temp:      TempSrc:      SpO2: 100% 99% 99% 98%  Weight:      Height:         Constitutional: NAD, calm, comfortable Vitals:   03/08/20 1214 03/08/20 1215 03/08/20 1216 03/08/20 1217  BP:  (!) 166/63    Pulse: 95 93 85 84  Resp: (!) 26 20 (!) 23 (!) 29  Temp:      TempSrc:      SpO2: 100% 99% 99% 98%  Weight:      Height:       Eyes: PERRL, lids and conjunctivae normal ENMT: Mucous membranes are moist. Posterior pharynx clear of any exudate or lesions.Normal dentition.  Neck: normal, supple, no masses, no thyromegaly Respiratory: clear to auscultation bilaterally, no wheezing, no crackles. Normal respiratory effort. No accessory muscle use.  Cardiovascular: Regular rate and rhythm, no murmurs / rubs / gallops. No  extremity edema. 2+ pedal pulses. No carotid bruits.  Abdomen: Diffuse Tenderness ++ , no masses palpated. No hepatosplenomegaly. Bowel sounds positive.  Musculoskeletal: no clubbing / cyanosis. No joint deformity upper and lower extremities. Good ROM, no contractures. Normal muscle tone.  Skin: no rashes, lesions, ulcers. No induration Neurologic: CN 2-12 grossly intact. Sensation intact, DTR normal. Strength 5/5 in all 4.  Psychiatric: Normal judgment and insight. Alert and oriented x 3. Normal mood.   Labs on Admission: I have personally reviewed following labs and imaging studies  CBC: Recent Labs  Lab 03/08/20 0752 03/08/20 0811  WBC 15.7*  --   NEUTROABS 13.8*  --   HGB 15.6 13.3  HCT 44.5 39.0  MCV 89.0  --   PLT 193  --    Basic Metabolic Panel: Recent Labs  Lab 03/08/20 0752 03/08/20 0811  NA 133* 138  K 4.4 3.6  CL 98 104  CO2 24  --   GLUCOSE 106* 100*  BUN 12 11  CREATININE 0.74 0.40*  CALCIUM 9.5  --    GFR: Estimated Creatinine Clearance: 90.2 mL/min (A) (by C-G formula based on SCr of 0.4 mg/dL (L)). Liver Function Tests: Recent Labs  Lab 03/08/20 0752  AST 129*  ALT 232*  ALKPHOS 129*  BILITOT 1.5*  PROT 8.0  ALBUMIN 4.1   Recent Labs  Lab 03/08/20 0752  LIPASE 21   No results for input(s):  AMMONIA in the last 168 hours. Coagulation Profile: No results for input(s): INR, PROTIME in the last 168 hours. Cardiac Enzymes: No results for input(s): CKTOTAL, CKMB, CKMBINDEX, TROPONINI in the last 168 hours. BNP (last 3 results) No results for input(s): PROBNP in the last 8760 hours. HbA1C: No results for input(s): HGBA1C in the last 72 hours. CBG: No results for input(s): GLUCAP in the last 168 hours. Lipid Profile: No results for input(s): CHOL, HDL, LDLCALC, TRIG, CHOLHDL, LDLDIRECT in the last 72 hours. Thyroid Function Tests: No results for input(s): TSH, T4TOTAL, FREET4, T3FREE, THYROIDAB in the last 72 hours. Anemia Panel: No results for input(s): VITAMINB12, FOLATE, FERRITIN, TIBC, IRON, RETICCTPCT in the last 72 hours. Urine analysis:    Component Value Date/Time   COLORURINE YELLOW 03/08/2020 [place] 03/08/2020 0931   LABSPEC 1.024 03/08/2020 0931   LABSPEC 1.015 03/02/2012 1620   PHURINE 7.0 03/08/2020 0931   GLUCOSEU NEGATIVE 03/08/2020 0931   GLUCOSEU NEGATIVE 03/19/2012 1426   HGBUR NEGATIVE 03/08/2020 0931   BILIRUBINUR NEGATIVE 03/08/2020 0931   BILIRUBINUR NEG 08/12/2012 1631   BILIRUBINUR Negative 03/02/2012 1620   KETONESUR 20 (A) 03/08/2020 0931   PROTEINUR NEGATIVE 03/08/2020 0931   UROBILINOGEN 0.2 07/07/2015 2056   NITRITE NEGATIVE 03/08/2020 0931   LEUKOCYTESUR NEGATIVE 03/08/2020 0931   LEUKOCYTESUR Negative 03/02/2012 1620    Radiological Exams on Admission: CT Angio Chest/Abd/Pel for Dissection W and/or Wo Contrast  Result Date: 03/08/2020 CLINICAL DATA:  Abdominal pain. EXAM: CT ANGIOGRAPHY CHEST, ABDOMEN AND PELVIS TECHNIQUE: Non-contrast CT of the chest was initially obtained. Multidetector CT imaging through the chest, abdomen and pelvis was performed using the standard protocol during bolus administration of intravenous contrast. Multiplanar reconstructed images and MIPs were obtained and reviewed to evaluate the  vascular anatomy. CONTRAST:  148m OMNIPAQUE IOHEXOL 350 MG/ML SOLN COMPARISON:  October 21, 2019. FINDINGS: CTA CHEST FINDINGS Cardiovascular: Preferential opacification of the thoracic aorta. No evidence of thoracic aortic aneurysm or dissection. Normal heart size. No pericardial effusion. Mediastinum/Nodes: No enlarged mediastinal, hilar, or axillary  lymph nodes. Thyroid gland, trachea, and esophagus demonstrate no significant findings. Lungs/Pleura: No pneumothorax or pleural effusion is noted. Mild biapical scarring is noted. Mild emphysematous bulla is noted in both lung apices is well. No acute abnormality is noted. Musculoskeletal: No chest wall abnormality. No acute or significant osseous findings. Review of the MIP images confirms the above findings. CTA ABDOMEN AND PELVIS FINDINGS VASCULAR Aorta: Normal caliber aorta without aneurysm, dissection, vasculitis or significant stenosis. Celiac: Patent without evidence of aneurysm, dissection, vasculitis or significant stenosis. SMA: Patent without evidence of aneurysm, dissection, vasculitis or significant stenosis. Renals: Not well visualized due to patient motion artifact. IMA: Patent without evidence of aneurysm, dissection, vasculitis or significant stenosis. Inflow: Left common and external iliac arteries are widely patent. Right common iliac artery is widely patent. 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 Moderate focal stenosis is noted involving the proximal portion of the right external iliac artery. Veins: No obvious venous abnormality within the limitations of this arterial phase study. Review of the MIP images confirms the above findings. NON-VASCULAR Hepatobiliary: No focal liver abnormality is seen. No gallstones, gallbladder wall thickening, or biliary dilatation. Pancreas: Unremarkable. No pancreatic ductal dilatation or surrounding inflammatory changes. Spleen: Normal in size without focal abnormality. Adrenals/Urinary Tract:  Adrenal glands are unremarkable. Kidneys are normal, without renal calculi, focal lesion, or hydronephrosis. Bladder is unremarkable. Stomach/Bowel: Status post appendectomy. The stomach is unremarkable. Mildly dilated small bowel loops are noted in the upper and left lower quadrant of the abdomen with possible transition zone seen in the ileum. Slight enhancement of the wall of the terminal ileum is noted, although no surrounding inflammatory changes are noted. No colonic dilatation is noted. Lymphatic: No significant adenopathy is noted. Reproductive: Prostate is unremarkable. Other: No abdominal wall hernia or abnormality. No abdominopelvic ascites. Musculoskeletal: No acute or significant osseous findings. Review of the MIP images confirms the above findings. IMPRESSION: 1. There is no evidence of thoracic or abdominal aortic dissection or aneurysm. 2. 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 3. Moderate focal stenosis is noted involving the proximal portion of the right external iliac artery. 4. Mildly dilated small bowel loops are noted in the upper and left lower quadrant of the abdomen with possible transition zone seen in the ileum. Slight enhancement of the wall of the terminal ileum is noted, although no surrounding inflammatory changes are noted. This may represent some degree of enteritis, but early or partial small bowel obstruction cannot be excluded. Emphysema (ICD10-J43.9). Electronically Signed   By: Marijo Conception M.D.   On: 03/08/2020 09:37    EKG: Normal sinus rhythm, no ST-T wave changes.  Assessment/Plan Principal Problem:   Small bowel obstruction due to adhesions [facility]) Active Problems:   Anal cancer ([facility])   Folate deficiency   GERD (gastroesophageal reflux disease)   Hypertension   Anxiety   Arthritis   Chronic pain syndrome   Depression   Abnormal liver function   Abdominal pain with nausea and vomiting: POA: Leukocytosis, abdominal pain, distention,  nausea vomiting Differential diagnosis includes small bowel obstruction, enteritis. CT abdomen pelvis consistent with SBO versus enteritis. General surgery consulted, conservative management so for now. NPO for now, start clear liquid diet when pain better No need for NG tube at this time IV Zosyn for enteritis (intraabdominal infection) IV Zofran and Phenergan for nausea vomiting IV Dilaudid as needed for pain control. Continue to monitor clinical status.  Hypertension: Resume Home BP medications  Hydralazine as needed.  Elevated liver enzymes: Which could be reactive.   Patient refuses recent alcohol use.  History of anal cancer with resection: Consider heme-onc consult if needed.  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 D/W General surgery, that can be followed up outpatient.    DVT prophylaxis: Lovenox Code Status: Full code Family Communication: No one at bedside except patient Disposition Plan: Anticipated discharge home in 1 to 2 days Consults called: General surgery Admission status: Inpatient   Shawna Clamp MD [facility]   If 7PM-7AM, please contact night-coverage [email or web address]   03/08/2020, 12:32 PM

## 2020-03-08 NOTE — Consult Note (Signed)
[facility] Surgery Consult Note  Martin [name] 1961-04-22  [id number].    Requesting MD: Coral Ceo PA-C Chief Complaint/Reason for Consult: abdominal pain  HPI:  Patient is a 59 year old male who presented to [facility] with abdominal pain. Patient is very distracted and fidgeting throughout exam and history taking, he is a poor historian. He reports to me that he has had intermittent abdominal pain for a month with trouble urinating and constipation intermittently. He reports that he had some coffee ground emesis yesterday afternoon and then more mucus-like emesis this AM. He reported to EDP that last BM was 3 days ago but reported to me that he had a formed BM this AM. Reported to EDP that he has chronic urinary issues and takes flomax. He denied any chronic medical issues but HTN and GERD are listed in chart. He has a hx of anal cancer s/p resection and radiation. He reports he has also had an appendectomy as a child, a hernia repair, and a small bowel resection for sbo about 5 years ago at [facility]. Patient is on disability for his back. He reports that he uses IV drugs including fentanyl which he used last night. He also reported to EDP that he used cocaine last night. He reports occasional alcohol use but won't quantify this for me. He reports he smokes several packs of cigarettes daily. He reports allergies to morphine, codeine and ASA and reaction as rash/itching. Patient repeatedly stating he is withdrawing and needs medication for this to RN while I was in the room.     ROS: Review of Systems  Constitutional: Negative for chills and fever.  Respiratory: Negative for shortness of breath and wheezing.   Cardiovascular: Negative for chest pain and palpitations.  Gastrointestinal: Positive for abdominal pain, constipation, nausea and vomiting. Negative for blood in stool, diarrhea and melena.  Genitourinary: Negative for dysuria and urgency.       Urinary retention    Musculoskeletal: Positive for back pain (chronic ).  Skin: Positive for itching.  Psychiatric/Behavioral: Positive for substance abuse. The patient is nervous/anxious.   All other systems reviewed and are negative.   Family History  Problem Relation Age of Onset  . Colon cancer Father   . Asthma Father   . Cancer Father        prostate  . Hypertension Father   . Asthma Mother   . Hypertension Mother   . Stomach cancer Neg Hx   . Esophageal cancer Neg Hx     Past Medical History:  Diagnosis Date  . Allergy 2 2013  . 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 Anxiety   . Arthritis    DDD lumbar, arthritis knees  . DJD (degenerative joint disease) of lumbar spine   . GERD (gastroesophageal reflux disease)   . Hemorrhoid    internal  . History of bowel resection   . Hypertension    EKG 12/12 EPIC   no  PCP- states increased lately but hasnt been diagnosed formally  . Inguinal hernia   . Pneumonia    as child, cough at present with no fever  . Pneumonia    as child, cough at present with no fever   . Radiation 11/19/11-01/08/12   5040 cGy 28 fx Pelvis and inguinal area    Past Surgical History:  Procedure Laterality Date  . APPENDECTOMY     age 45 or 59  . EXAMINATION UNDER  ANESTHESIA  10/14/2011   Procedure: EXAM UNDER ANESTHESIA;  Surgeon: Earnstine Regal, MD;  Location: [facility];  Service: General;  Laterality: N/A;  exam under anethesia, Excision of mass anal canal, 1.5cm  . INGUINAL HERNIA REPAIR  05/11/2012   Procedure: HERNIA REPAIR INGUINAL ADULT;  Surgeon: Earnstine Regal, MD;  Location: [place];  Service: General;  Laterality: Left;  left inguinal hernia repair with mesh  . surgical pathology   10/14/2011   squamous cell ca of anus  . transanal excision  10/14/2011   Dr.Todd Gerkin    Social History:  reports that he has been smoking cigarettes. He has a 58.75 pack-year smoking history. He has never used smokeless  tobacco. He reports current alcohol use of about 6.0 standard drinks of alcohol per week. He reports current drug use. Drugs: Marijuana and Cocaine.  Allergies:  Allergies  Allergen Reactions  . Yellow Jacket Venom [Bee Venom] Anaphylaxis  . Aspirin Swelling  . Codeine Nausea Only  . Morphine And Related Itching    (Not in a hospital admission)   Blood pressure (!) 141/96, pulse (!) 112, temperature 98.1 F (36.7 C), temperature source Oral, resp. rate 16, height 5' 10"  (1.778 m), weight 65.8 kg, SpO2 99 %. Physical Exam:  General: pleasant, WD, cachectic white male who is laying in bed in clear discomfort and fidgeting HEENT: Sclera are noninjected.  PERRL.  Ears and nose without any masses or lesions.  Very poor dentition with multiple teeth missing and multiple broken teeth with obvious caries Heart: sinus tachycardia in the low 100s.  Normal s1,s2. No obvious murmurs, gallops, or rubs noted.  Palpable radial and pedal pulses bilaterally Lungs: CTAB, no wheezes, rhonchi, or rales noted.  Respiratory effort nonlabored Abd: soft, mildly ttp in lower abdomen, no peritonitis or guarding, ND, +BS, no masses, hernias, or organomegaly, midline surgical scar MS: all 4 extremities are symmetrical with no cyanosis, clubbing, or edema. Skin: warm and dry with no masses, lesions, or rashes Neuro: Cranial nerves 2-12 grossly intact, sensation is grossly intact throughout Psych: A&Ox3 with a very anxious affect.   Results for orders placed or performed during the hospital encounter of 03/08/20 (from the past 48 hour(s))  CBC with Differential     Status: Abnormal   Collection Time: 03/08/20  7:52 AM  Result Value Ref Range   WBC 15.7 (H) 4.0 - 10.5 K/uL   RBC 5.00 4.22 - 5.81 MIL/uL   Hemoglobin 15.6 13.0 - 17.0 g/dL   HCT 44.5 39 - 52 %   MCV 89.0 80.0 - 100.0 fL   MCH 31.2 26.0 - 34.0 pg   MCHC 35.1 30.0 - 36.0 g/dL   RDW 13.7 11.5 - 15.5 %   Platelets 193 150 - 400 K/uL   nRBC 0.0 0.0  - 0.2 %   Neutrophils Relative % 88 %   Neutro Abs 13.8 (H) 1.7 - 7.7 K/uL   Lymphocytes Relative 7 %   Lymphs Abs 1.0 0.7 - 4.0 K/uL   Monocytes Relative 4 %   Monocytes Absolute 0.7 0 - 1 K/uL   Eosinophils Relative 0 %   Eosinophils Absolute 0.0 0 - 0 K/uL   Basophils Relative 0 %   Basophils Absolute 0.1 0 - 0 K/uL   Immature Granulocytes 1 %   Abs Immature Granulocytes 0.08 (H) 0.00 - 0.07 K/uL    Comment: Performed at [facility], [place] [address] [place], [place] [address]  Comprehensive  metabolic panel     Status: Abnormal   Collection Time: 03/08/20  7:52 AM  Result Value Ref Range   Sodium 133 (L) 135 - 145 mmol/L   Potassium 4.4 3.5 - 5.1 mmol/L   Chloride 98 98 - 111 mmol/L   CO2 24 22 - 32 mmol/L   Glucose, Bld 106 (H) 70 - 99 mg/dL    Comment: Glucose reference range applies only to samples taken after fasting for at least 8 hours.   BUN 12 8 - 23 mg/dL   Creatinine, Ser 0.74 0.61 - 1.24 mg/dL   Calcium 9.5 8.9 - 10.3 mg/dL   Total Protein 8.0 6.5 - 8.1 g/dL   Albumin 4.1 3.5 - 5.0 g/dL   AST 129 (H) 15 - 41 U/L   ALT 232 (H) 0 - 44 U/L   Alkaline Phosphatase 129 (H) 38 - 126 U/L   Total Bilirubin 1.5 (H) 0.3 - 1.2 mg/dL   GFR calc non Af Amer >60 >60 mL/min   GFR calc Af Amer >60 >60 mL/min   Anion gap 11 5 - 15    Comment: Performed at [facility], [place] [address] [place], [place] [address]  Lipase, blood     Status: None   Collection Time: 03/08/20  7:52 AM  Result Value Ref Range   Lipase 21 11 - 51 U/L    Comment: Performed at [facility], [place] [address], [place], [place] [address]  Troponin I (High Sensitivity)     Status: None   Collection Time: 03/08/20  7:52 AM  Result Value Ref Range   Troponin I (High Sensitivity) 14 <18 ng/L    Comment: (NOTE) Elevated high sensitivity troponin I (hsTnI) values and significant  changes across serial measurements may suggest ACS but many other  chronic  and acute conditions are known to elevate hsTnI results.  Refer to the "Links" section for chest pain algorithms and additional  guidance. Performed at [facility], [place] [address] [place], [place] [address]   I-stat chem 8, ED (not at [facility] or [facility])     Status: Abnormal   Collection Time: 03/08/20  8:11 AM  Result Value Ref Range   Sodium 138 135 - 145 mmol/L   Potassium 3.6 3.5 - 5.1 mmol/L   Chloride 104 98 - 111 mmol/L   BUN 11 8 - 23 mg/dL   Creatinine, Ser 0.40 (L) 0.61 - 1.24 mg/dL   Glucose, Bld 100 (H) 70 - 99 mg/dL    Comment: Glucose reference range applies only to samples taken after fasting for at least 8 hours.   Calcium, Ion 1.05 (L) 1.15 - 1.40 mmol/L   TCO2 21 (L) 22 - 32 mmol/L   Hemoglobin 13.3 13.0 - 17.0 g/dL   HCT 39.0 39 - 52 %  Urinalysis, Routine w reflex microscopic     Status: Abnormal   Collection Time: 03/08/20  9:31 AM  Result Value Ref Range   Color, Urine YELLOW YELLOW   APPearance CLEAR CLEAR   Specific Gravity, Urine 1.024 1.005 - 1.030   pH 7.0 5.0 - 8.0   Glucose, UA NEGATIVE NEGATIVE mg/dL   Hgb urine dipstick NEGATIVE NEGATIVE   Bilirubin Urine NEGATIVE NEGATIVE   Ketones, ur 20 (A) NEGATIVE mg/dL   Protein, ur NEGATIVE NEGATIVE mg/dL   Nitrite NEGATIVE NEGATIVE   Leukocytes,Ua NEGATIVE NEGATIVE    Comment: Performed at [place] [address] [place], [place]  [address]   CT Angio Chest/Abd/Pel for Dissection W and/or Wo Contrast  Result Date: 03/08/2020 CLINICAL DATA:  Abdominal pain. EXAM: CT ANGIOGRAPHY CHEST, ABDOMEN AND PELVIS TECHNIQUE: Non-contrast CT of the chest was initially obtained. Multidetector CT imaging through the chest, abdomen and pelvis was performed using the standard protocol during bolus administration of intravenous contrast. Multiplanar reconstructed images and MIPs were obtained and reviewed to evaluate the vascular anatomy. CONTRAST:  122m OMNIPAQUE IOHEXOL 350 MG/ML SOLN  COMPARISON:  October 21, 2019. FINDINGS: CTA CHEST FINDINGS Cardiovascular: Preferential opacification of the thoracic aorta. No evidence of thoracic aortic aneurysm or dissection. Normal heart size. No pericardial effusion. Mediastinum/Nodes: No enlarged mediastinal, hilar, or axillary lymph nodes. Thyroid gland, trachea, and esophagus demonstrate no significant findings. Lungs/Pleura: No pneumothorax or pleural effusion is noted. Mild biapical scarring is noted. Mild emphysematous bulla is noted in both lung apices is well. No acute abnormality is noted. Musculoskeletal: No chest wall abnormality. No acute or significant osseous findings. Review of the MIP images confirms the above findings. CTA ABDOMEN AND PELVIS FINDINGS VASCULAR Aorta: Normal caliber aorta without aneurysm, dissection, vasculitis or significant stenosis. Celiac: Patent without evidence of aneurysm, dissection, vasculitis or significant stenosis. SMA: Patent without evidence of aneurysm, dissection, vasculitis or significant stenosis. Renals: Not well visualized due to patient motion artifact. IMA: Patent without evidence of aneurysm, dissection, vasculitis or significant stenosis. Inflow: Left common and external iliac arteries are widely patent. Right common iliac artery is widely patent. 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 Moderate focal stenosis is noted involving the proximal portion of the right external iliac artery. Veins: No obvious venous abnormality within the limitations of this arterial phase study. Review of the MIP images confirms the above findings. NON-VASCULAR Hepatobiliary: No focal liver abnormality is seen. No gallstones, gallbladder wall thickening, or biliary dilatation. Pancreas: Unremarkable. No pancreatic ductal dilatation or surrounding inflammatory changes. Spleen: Normal in size without focal abnormality. Adrenals/Urinary Tract: Adrenal glands are unremarkable. Kidneys are normal, without renal  calculi, focal lesion, or hydronephrosis. Bladder is unremarkable. Stomach/Bowel: Status post appendectomy. The stomach is unremarkable. Mildly dilated small bowel loops are noted in the upper and left lower quadrant of the abdomen with possible transition zone seen in the ileum. Slight enhancement of the wall of the terminal ileum is noted, although no surrounding inflammatory changes are noted. No colonic dilatation is noted. Lymphatic: No significant adenopathy is noted. Reproductive: Prostate is unremarkable. Other: No abdominal wall hernia or abnormality. No abdominopelvic ascites. Musculoskeletal: No acute or significant osseous findings. Review of the MIP images confirms the above findings. IMPRESSION: 1. There is no evidence of thoracic or abdominal aortic dissection or aneurysm. 2. 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 3. Moderate focal stenosis is noted involving the proximal portion of the right external iliac artery. 4. Mildly dilated small bowel loops are noted in the upper and left lower quadrant of the abdomen with possible transition zone seen in the ileum. Slight enhancement of the wall of the terminal ileum is noted, although no surrounding inflammatory changes are noted. This may represent some degree of enteritis, but early or partial small bowel obstruction cannot be excluded. Emphysema (ICD10-J43.9). Electronically Signed   By: JMarijo ConceptionM.D.   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  Abdominal pain ?PSBO vs enteritis - CT 7/1 shows mildly dilated loops of small bowel and slight enhancement of wall of terminal ileum, possible transition in terminal ileum - WBC elevated at 15 - patient abdominal exam actually fairly benign and he reports formed BM this AM - no emergent  surgical intervention at this time - recommend bowel rest with ice chips and sips of clears only - bowel regimen - I don't think patient needs or would tolerate NGT placement at this time, may hold off for now unless patient starts vomiting - we will follow  FEN: NPO, IVF VTE: ok to have chemical prophylaxis from a surgery standpoint ID: recommend [email or web address]  Norm Parcel, [facility] Surgery 03/08/2020, 10:40 AM Please see Amion for pager number during day hours 7:00am-4:30pm

## 2020-03-08 NOTE — ED Triage Notes (Signed)
Patient arrived by EMS from home. Patient c/o ABD pain and vomiting that started last night.   Patient had previous ABD surgery.

## 2020-03-08 NOTE — ED Provider Notes (Signed)
[name] DEPT Provider Note   CSN: [id number] Arrival date & time: 03/08/20  3220     History Chief Complaint  Patient presents with  . Abdominal Pain    Martin [name] is a 59 y.o. male.  HPI   Pt is a 59 y/o M with a h/o anal CA, GERD, bowel resection, HTN, diverticulosis, who presents to the ED today for eval of abdominal pain.  States he began to have left-sided flank pain yesterday that was somewhat sudden in onset.  Pain radiates around to the left side of the abdomen now.  Pain worse to the left lower quadrant.  Pain constant and severe in nature.  Pain associated with nausea and vomiting.  He is also been somewhat constipated recently.  Last normal BM was 3 days ago however he did have a small amount of output yesterday.  States he still been passing flatus.  He has chronic issues with urination is on Flomax.  He denies any dysuria.  He denies any fevers.  Pt admits to IVDU and used fentanyl last night. Also used cocaine last night.   Past Medical History:  Diagnosis Date  . Allergy 2 2013  . 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 Anxiety   . Arthritis    DDD lumbar, arthritis knees  . DJD (degenerative joint disease) of lumbar spine   . GERD (gastroesophageal reflux disease)   . Hemorrhoid    internal  . History of bowel resection   . Hypertension    EKG 12/12 EPIC   no  PCP- states increased lately but hasnt been diagnosed formally  . Inguinal hernia   . Pneumonia    as child, cough at present with no fever  . Pneumonia    as child, cough at present with no fever   . Radiation 11/19/11-01/08/12   5040 cGy 28 fx Pelvis and inguinal area    Patient Active Problem List   Diagnosis Date Noted  . Enteritis 10/15/2019  . Abnormal liver function 10/15/2019  . Leukocytosis 10/15/2019  . Nausea & vomiting 10/15/2019  . GAD (generalized anxiety disorder) 05/18/2017  . 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 Constipation 04/11/2017  . Bee sting 04/05/2017  . Diverticulosis 11/29/2014  . Healthcare maintenance 11/06/2014  . Cough 10/05/2014  . Insomnia 09/13/2014  . Erectile dysfunction 11/30/2013  . Chronic pain syndrome 11/30/2013  . Depression 11/30/2013  . GERD (gastroesophageal reflux disease)   . Hypertension   . Anxiety   . Arthritis   . DJD (degenerative joint disease) of lumbar spine   . Radiation   . Night sweats 08/12/2012  . Tobacco abuse counseling 05/26/2012  . 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 B12 nutritional deficiency 03/19/2012  . Folate deficiency 03/19/2012  . Inguinal hernia 11/24/2011  . Cancer ([place]) 10/14/2011  . Internal hemorrhoid 09/11/2011  . Anal cancer ([place]) 10/09/2010    Past Surgical History:  Procedure Laterality Date  . APPENDECTOMY     age 8 or 2  . EXAMINATION UNDER ANESTHESIA  10/14/2011   Procedure: EXAM UNDER ANESTHESIA;  Surgeon: Earnstine Regal, MD;  Location: [facility];  Service: General;  Laterality: N/A;  exam under anethesia, Excision of mass anal canal, 1.5cm  . INGUINAL HERNIA REPAIR  05/11/2012   Procedure: HERNIA REPAIR INGUINAL ADULT;  Surgeon: Earnstine Regal, MD;  Location: [facility];  Service: General;  Laterality: Left;  left  inguinal hernia repair with mesh  . surgical pathology   10/14/2011   squamous cell ca of anus  . transanal excision  10/14/2011   Dr.Todd Gerkin       Family History  Problem Relation Age of Onset  . Colon cancer Father   . Asthma Father   . Cancer Father        prostate  . Hypertension Father   . Asthma Mother   . Hypertension Mother   . Stomach cancer Neg Hx   . Esophageal cancer Neg Hx     Social History   Tobacco Use  . Smoking status: Current Every Day Smoker    Packs/day: 1.25    Years: 47.00    Pack years: 58.75    Types: Cigarettes  . Smokeless tobacco: Never Used  . Tobacco comment: Cutting back>> Quit Smart card supplied  Substance Use Topics  . Alcohol use: Yes     Alcohol/week: 6.0 standard drinks    Types: 6 Cans of beer per week    Comment: weekend    6pk weekend, beer   . Drug use: Yes    Types: Marijuana, Cocaine    Comment: marijuana in past, occasionally now-last use 3-4 weeks ago    Home Medications Prior to Admission medications   Medication Sig Start Date End Date Taking? Authorizing Provider  EPINEPHrine (EPIPEN 2-PAK) 0.3 mg/0.3 mL IJ SOAJ injection Inject 0.3 mLs (0.3 mg total) into the muscle as needed for anaphylaxis. 02/23/20  Yes Long, Wonda Olds, MD  oxyCODONE-acetaminophen (PERCOCET/ROXICET) 5-325 MG tablet Take 1 tablet by mouth every 4 (four) hours as needed for severe pain. 02/16/20  Yes Gery Pray, MD  amoxicillin-clavulanate (AUGMENTIN) 875-125 MG tablet Take 1 tablet by mouth 2 (two) times daily. One po bid x 7 days Patient not taking: Reported on 11/28/2019 10/21/19   Palumbo, April, MD  omeprazole (PRILOSEC) 20 MG capsule Take 1 capsule (20 mg total) by mouth daily. Patient not taking: Reported on 11/28/2019 10/21/19   Palumbo, April, MD  sucralfate (CARAFATE) 1 GM/10ML suspension Take 10 mLs (1 g total) by mouth 4 (four) times daily -  with meals and at bedtime. Patient not taking: Reported on 11/28/2019 10/21/19   Palumbo, April, MD  tamsulosin (FLOMAX) 0.4 MG CAPS capsule Take 1 capsule (0.4 mg total) by mouth at bedtime. Patient not taking: Reported on 03/08/2020 01/18/20   Gery Pray, MD    Allergies    Yellow jacket venom [bee venom], Aspirin, Codeine, and Morphine and related  Review of Systems   Review of Systems  Constitutional: Negative for fever.  HENT: Negative for sore throat.   Eyes: Negative for visual disturbance.  Respiratory: Negative for cough and shortness of breath.   Cardiovascular: Negative for chest pain.  Gastrointestinal: Positive for abdominal pain, constipation, nausea and vomiting. Negative for diarrhea.  Genitourinary: Negative for dysuria and hematuria.  Musculoskeletal: Negative for back  pain.  Skin: Negative for rash.  Neurological: Negative for headaches.  All other systems reviewed and are negative.   Physical Exam Updated Vital Signs BP (!) 141/96   Pulse (!) 112   Temp 98.1 F (36.7 C) (Oral)   Resp 16   Ht 5' 10"  (1.778 m)   Wt 65.8 kg   SpO2 99%   BMI 20.81 kg/m   Physical Exam Vitals and nursing note reviewed.  Constitutional:      Appearance: He is well-developed.  HENT:     Head: Normocephalic and atraumatic.  Eyes:  Conjunctiva/sclera: Conjunctivae normal.  Cardiovascular:     Rate and Rhythm: Normal rate and regular rhythm.     Heart sounds: Normal heart sounds. No murmur heard.   Pulmonary:     Effort: Pulmonary effort is normal. No respiratory distress.     Breath sounds: Normal breath sounds. No wheezing or rhonchi.  Abdominal:     General: Abdomen is flat. Bowel sounds are decreased.     Palpations: Abdomen is soft.     Tenderness: There is abdominal tenderness in the left upper quadrant and left lower quadrant. There is guarding. There is no rebound.  Musculoskeletal:     Cervical back: Neck supple.  Skin:    General: Skin is warm and dry.  Neurological:     Mental Status: He is alert.     ED Results / Procedures / Treatments   Labs (all labs ordered are listed, but only abnormal results are displayed) Labs Reviewed  CBC WITH DIFFERENTIAL/PLATELET - Abnormal; Notable for the following components:      Result Value   WBC 15.7 (*)    Neutro Abs 13.8 (*)    Abs Immature Granulocytes 0.08 (*)    All other components within normal limits  COMPREHENSIVE METABOLIC PANEL - Abnormal; Notable for the following components:   Sodium 133 (*)    Glucose, Bld 106 (*)    AST 129 (*)    ALT 232 (*)    Alkaline Phosphatase 129 (*)    Total Bilirubin 1.5 (*)    All other components within normal limits  URINALYSIS, ROUTINE W REFLEX MICROSCOPIC - Abnormal; Notable for the following components:   Ketones, ur 20 (*)    All other  components within normal limits  I-STAT CHEM 8, ED - Abnormal; Notable for the following components:   Creatinine, Ser 0.40 (*)    Glucose, Bld 100 (*)    Calcium, Ion 1.05 (*)    TCO2 21 (*)    All other components within normal limits  LIPASE, BLOOD  TROPONIN I (HIGH SENSITIVITY)  TROPONIN I (HIGH SENSITIVITY)    EKG EKG Interpretation  Date/Time:  Thursday March 08 2020 07:31:37 EDT Ventricular Rate:  70 PR Interval:    QRS Duration: 84 QT Interval:  380 QTC Calculation: 410 R Axis:   87 Text Interpretation: Sinus rhythm Left atrial enlargement Borderline right axis deviation RSR' in V1 or V2, probably normal variant Left ventricular hypertrophy Anterior ST elevation, probably due to LVH peaked t waves but similar to old Confirmed by Charlesetta Shanks [phone]) on 03/08/2020 8:02:45 AM   Radiology CT Angio Chest/Abd/Pel for Dissection W and/or Wo Contrast  Result Date: 03/08/2020 CLINICAL DATA:  Abdominal pain. EXAM: CT ANGIOGRAPHY CHEST, ABDOMEN AND PELVIS TECHNIQUE: Non-contrast CT of the chest was initially obtained. Multidetector CT imaging through the chest, abdomen and pelvis was performed using the standard protocol during bolus administration of intravenous contrast. Multiplanar reconstructed images and MIPs were obtained and reviewed to evaluate the vascular anatomy. CONTRAST:  157m OMNIPAQUE IOHEXOL 350 MG/ML SOLN COMPARISON:  October 21, 2019. FINDINGS: CTA CHEST FINDINGS Cardiovascular: Preferential opacification of the thoracic aorta. No evidence of thoracic aortic aneurysm or dissection. Normal heart size. No pericardial effusion. Mediastinum/Nodes: No enlarged mediastinal, hilar, or axillary lymph nodes. Thyroid gland, trachea, and esophagus demonstrate no significant findings. Lungs/Pleura: No pneumothorax or pleural effusion is noted. Mild biapical scarring is noted. Mild emphysematous bulla is noted in both lung apices is well. No acute abnormality is noted.  Musculoskeletal: No  chest wall abnormality. No acute or significant osseous findings. Review of the MIP images confirms the above findings. CTA ABDOMEN AND PELVIS FINDINGS VASCULAR Aorta: Normal caliber aorta without aneurysm, dissection, vasculitis or significant stenosis. Celiac: Patent without evidence of aneurysm, dissection, vasculitis or significant stenosis. SMA: Patent without evidence of aneurysm, dissection, vasculitis or significant stenosis. Renals: Not well visualized due to patient motion artifact. IMA: Patent without evidence of aneurysm, dissection, vasculitis or significant stenosis. Inflow: Left common and external iliac arteries are widely patent. Right common iliac artery is widely patent. 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 Moderate focal stenosis is noted involving the proximal portion of the right external iliac artery. Veins: No obvious venous abnormality within the limitations of this arterial phase study. Review of the MIP images confirms the above findings. NON-VASCULAR Hepatobiliary: No focal liver abnormality is seen. No gallstones, gallbladder wall thickening, or biliary dilatation. Pancreas: Unremarkable. No pancreatic ductal dilatation or surrounding inflammatory changes. Spleen: Normal in size without focal abnormality. Adrenals/Urinary Tract: Adrenal glands are unremarkable. Kidneys are normal, without renal calculi, focal lesion, or hydronephrosis. Bladder is unremarkable. Stomach/Bowel: Status post appendectomy. The stomach is unremarkable. Mildly dilated small bowel loops are noted in the upper and left lower quadrant of the abdomen with possible transition zone seen in the ileum. Slight enhancement of the wall of the terminal ileum is noted, although no surrounding inflammatory changes are noted. No colonic dilatation is noted. Lymphatic: No significant adenopathy is noted. Reproductive: Prostate is unremarkable. Other: No abdominal wall hernia or abnormality.  No abdominopelvic ascites. Musculoskeletal: No acute or significant osseous findings. Review of the MIP images confirms the above findings. IMPRESSION: 1. There is no evidence of thoracic or abdominal aortic dissection or aneurysm. 2. 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 3. Moderate focal stenosis is noted involving the proximal portion of the right external iliac artery. 4. Mildly dilated small bowel loops are noted in the upper and left lower quadrant of the abdomen with possible transition zone seen in the ileum. Slight enhancement of the wall of the terminal ileum is noted, although no surrounding inflammatory changes are noted. This may represent some degree of enteritis, but early or partial small bowel obstruction cannot be excluded. Emphysema (ICD10-J43.9). Electronically Signed   By: Marijo Conception M.D.   On: 03/08/2020 09:37    Procedures Procedures (including critical care time)  Medications Ordered in ED Medications  ondansetron (ZOFRAN) injection 4 mg (4 mg Intravenous Given 03/08/20 0743)  sodium chloride 0.9 % bolus 1,000 mL (0 mLs Intravenous Stopped 03/08/20 1012)  HYDROmorphone (DILAUDID) injection 1 mg (1 mg Intravenous Given 03/08/20 0744)  HYDROmorphone (DILAUDID) injection 1 mg (1 mg Intravenous Given 03/08/20 0805)  hydrALAZINE (APRESOLINE) injection 20 mg (20 mg Intravenous Given 03/08/20 0805)  sodium chloride (PF) 0.9 % injection (  Given by Other 03/08/20 0912)  iohexol (OMNIPAQUE) 350 MG/ML injection 100 mL (100 mLs Intravenous Contrast Given 03/08/20 0855)  LORazepam (ATIVAN) injection 1 mg (1 mg Intravenous Given 03/08/20 0936)  sodium chloride 0.9 % bolus 1,000 mL (1,000 mLs Intravenous New Bag/Given 03/08/20 1030)  morphine 4 MG/ML injection 4 mg (4 mg Intravenous Given 03/08/20 1031)    ED Course  I have reviewed the triage vital signs and the nursing notes.  Pertinent labs & imaging results that were available during my care of the patient were reviewed by me  and considered in my medical decision making (see chart for details).  MDM Rules/Calculators/A&P                          59 year old male with history of anal cancer, diverticulosis, bowel resection presenting for evaluation of left-sided abdominal pain starting yesterday.  Associated with nausea and vomiting.  Will get labs, UA, CT abdomen/pelvis.  Will give IV fluids, antiemetics, pain medication.  Chem 8 with normal Cr CBC with leukocytosis at 15k, otherwise reassuring CMP with normal Cr, no gross electrolyte abnormality, LFTs elevated but this is chronic, alk phos and bili elevated - again this is chronic Lipase neg Trop neg UA neg for UTI  Bedside US performed by Dr. Johnney Killian due to concern for abd bruit. No obvious enlarged aorta on [place]. BP elevated. Dr. Johnney Killian recommended additional 1 mg IV dilaudid and 20 mg IV hydralazine.  EKG with NSR, LAE, borderline RAD, RSR' in V1 or V2, likely normal variant, LVH, anterior ST elevation likely from LVH, peaked T-waves, otherwise unchanged  CT chest/abd/pelvis dissection - reviewed/interpreted and I agree with radiology. No evidence of aortic aneurysm/dissection. Findings concerning for possible partial small about obstruction vs enteritis.   10:22 AM CONSULT with Modena Jansky, PA-C with general surgery team who consult on the patient. Recommends hospitalist admit.   10:36 AM CONSULT with Dr. Dwyane Dee with hospitalist service who accepts patient for admission.   Final Clinical Impression(s) / ED Diagnoses Final diagnoses:  Abdominal pain, unspecified abdominal location    Rx / DC Orders ED Discharge Orders    None       Bishop Dublin 03/08/20 1609    Charlesetta Shanks, MD 03/09/20 1026

## 2020-03-09 ENCOUNTER — Inpatient Hospital Stay (HOSPITAL_COMMUNITY): Payer: Medicaid Other

## 2020-03-09 DIAGNOSIS — K565 Intestinal adhesions [bands], unspecified as to partial versus complete obstruction: Secondary | ICD-10-CM

## 2020-03-09 LAB — GLUCOSE, CAPILLARY: Glucose-Capillary: 89 mg/dL (ref 70–99)

## 2020-03-09 LAB — COMPREHENSIVE METABOLIC PANEL
ALT: 166 U/L — ABNORMAL HIGH (ref 0–44)
AST: 73 U/L — ABNORMAL HIGH (ref 15–41)
Albumin: 3.5 g/dL (ref 3.5–5.0)
Alkaline Phosphatase: 104 U/L (ref 38–126)
Anion gap: 11 (ref 5–15)
BUN: 10 mg/dL (ref 8–23)
CO2: 23 mmol/L (ref 22–32)
Calcium: 8.5 mg/dL — ABNORMAL LOW (ref 8.9–10.3)
Chloride: 99 mmol/L (ref 98–111)
Creatinine, Ser: 0.8 mg/dL (ref 0.61–1.24)
GFR calc Af Amer: >60 mL/min (ref >60)
GFR calc non Af Amer: >60 mL/min (ref >60)
Glucose, Bld: 110 mg/dL — ABNORMAL HIGH (ref 70–99)
Potassium: 3.7 mmol/L (ref 3.5–5.1)
Sodium: 133 mmol/L — ABNORMAL LOW (ref 135–145)
Total Bilirubin: 1.3 mg/dL — ABNORMAL HIGH (ref 0.3–1.2)
Total Protein: 6.8 g/dL (ref 6.5–8.1)

## 2020-03-09 LAB — CBC
HCT: 42.3 % (ref 39.0–52.0)
Hemoglobin: 14.9 g/dL (ref 13.0–17.0)
MCH: 30.5 pg (ref 26.0–34.0)
MCHC: 35.2 g/dL (ref 30.0–36.0)
MCV: 86.7 fL (ref 80.0–100.0)
Platelets: 180 10*3/uL (ref 150–400)
RBC: 4.88 MIL/uL (ref 4.22–5.81)
RDW: 13.5 % (ref 11.5–15.5)
WBC: 6.4 10*3/uL (ref 4.0–10.5)
nRBC: 0 % (ref 0.0–0.2)

## 2020-03-09 LAB — HEPATITIS PANEL, ACUTE
HCV Ab: REACTIVE — AB
Hep A IgM: NONREACTIVE
Hep B C IgM: NONREACTIVE
Hepatitis B Surface Ag: NONREACTIVE

## 2020-03-09 MED ORDER — LOPERAMIDE HCL 2 MG PO CAPS: 2.0000 mg | ORAL_CAPSULE | ORAL | Status: DC | PRN

## 2020-03-09 MED ORDER — ONDANSETRON HCL 4 MG/2ML IJ SOLN
4.0000 mg | Freq: Four times a day (QID) | INTRAMUSCULAR | Status: DC | PRN
  Administered 2020-03-09: 4 mg via INTRAVENOUS

## 2020-03-09 MED ORDER — DIATRIZOATE MEGLUMINE & SODIUM 66-10 % PO SOLN
90.0000 mL | Freq: Once | ORAL | Status: AC
  Administered 2020-03-09: 90 mL via ORAL
  Filled 2020-03-09: qty 90

## 2020-03-09 MED ORDER — HYDROXYZINE HCL 25 MG PO TABS: 25.0000 mg | ORAL_TABLET | Freq: Four times a day (QID) | ORAL | Status: DC | PRN

## 2020-03-09 MED ORDER — ONDANSETRON 4 MG PO TBDP: 4.0000 mg | ORAL_TABLET | Freq: Four times a day (QID) | ORAL | Status: DC | PRN

## 2020-03-09 MED ORDER — NAPROXEN 500 MG PO TABS: 500.0000 mg | ORAL_TABLET | Freq: Two times a day (BID) | ORAL | Status: DC | PRN

## 2020-03-09 MED ORDER — LORAZEPAM 2 MG/ML IJ SOLN
0.5000 mg | Freq: Once | INTRAMUSCULAR | Status: AC
  Administered 2020-03-09: 0.5 mg via INTRAVENOUS
  Filled 2020-03-09: qty 1

## 2020-03-09 MED ORDER — CLONIDINE HCL 0.1 MG PO TABS: 0.1000 mg | ORAL_TABLET | Freq: Four times a day (QID) | ORAL | Status: DC

## 2020-03-09 MED ORDER — CLONIDINE HCL 0.1 MG PO TABS: 0.1000 mg | ORAL_TABLET | Freq: Every day | ORAL | Status: DC

## 2020-03-09 MED ORDER — DICYCLOMINE HCL 20 MG PO TABS
20.0000 mg | ORAL_TABLET | Freq: Four times a day (QID) | ORAL | Status: DC | PRN
  Filled 2020-03-09: qty 1

## 2020-03-09 MED ORDER — CLONIDINE HCL 0.1 MG PO TABS: 0.1000 mg | ORAL_TABLET | ORAL | Status: DC

## 2020-03-09 MED ORDER — ONDANSETRON HCL 4 MG/2ML IJ SOLN
4.0000 mg | Freq: Four times a day (QID) | INTRAMUSCULAR | Status: DC
  Filled 2020-03-09: qty 2

## 2020-03-09 MED ORDER — METHOCARBAMOL 500 MG PO TABS: 500.0000 mg | ORAL_TABLET | Freq: Three times a day (TID) | ORAL | Status: DC | PRN

## 2020-03-09 NOTE — Discharge Summary (Signed)
Physician Discharge Summary  Martin [name] QHU:[id number] DOB: [birth year] DOA: 03/08/2020  PCP: Patient, No Pcp Per  Admit date: 03/08/2020 Discharge date: 03/09/2020  Admitted From: Home Disposition:  AMA  Left AMA  Brief/Interim Summary: Please see the progress note from today for further details.  Patient signed AMA  Discharge Diagnoses:  Principal Problem:   Small bowel obstruction due to adhesions [facility]) Active Problems:   Anal cancer (HCC)   Folate deficiency   GERD (gastroesophageal reflux disease)   Hypertension   Anxiety   Arthritis   Chronic pain syndrome   Depression   Abnormal liver function    Discharge Instructions     Allergies  Allergen Reactions   Yellow Jacket Venom [Bee Venom] Anaphylaxis   Aspirin Swelling   Codeine Nausea Only   Morphine And Related Itching    Consultations:  Surgery   Procedures/Studies: DG Abd Portable 1V  Result Date: 03/09/2020 CLINICAL DATA:  Abdominal distension. EXAM: PORTABLE ABDOMEN - 1 VIEW COMPARISON:  CT scan 10/21/2019 FINDINGS: The visualized lung bases are grossly clear. Dilated small bowel loops are noted. No obvious free air on this portable supine film. There is some air in stool in the right colon but otherwise the colon is relatively decompressed. The bony structures are unremarkable. IMPRESSION: Small-bowel obstruction bowel gas pattern.  No obvious free air. Electronically Signed   By: Marijo Sanes M.D.   On: 03/09/2020 06:16   CT Angio Chest/Abd/Pel for Dissection W and/or Wo Contrast  Result Date: 03/08/2020 CLINICAL DATA:  Abdominal pain. EXAM: CT ANGIOGRAPHY CHEST, ABDOMEN AND PELVIS TECHNIQUE: Non-contrast CT of the chest was initially obtained. Multidetector CT imaging through the chest, abdomen and pelvis was performed using the standard protocol during bolus administration of intravenous contrast. Multiplanar reconstructed images and MIPs were obtained and reviewed to evaluate the vascular  anatomy. CONTRAST:  152m OMNIPAQUE IOHEXOL 350 MG/ML SOLN COMPARISON:  October 21, 2019. FINDINGS: CTA CHEST FINDINGS Cardiovascular: Preferential opacification of the thoracic aorta. No evidence of thoracic aortic aneurysm or dissection. Normal heart size. No pericardial effusion. Mediastinum/Nodes: No enlarged mediastinal, hilar, or axillary lymph nodes. Thyroid gland, trachea, and esophagus demonstrate no significant findings. Lungs/Pleura: No pneumothorax or pleural effusion is noted. Mild biapical scarring is noted. Mild emphysematous bulla is noted in both lung apices is well. No acute abnormality is noted. Musculoskeletal: No chest wall abnormality. No acute or significant osseous findings. Review of the MIP images confirms the above findings. CTA ABDOMEN AND PELVIS FINDINGS VASCULAR Aorta: Normal caliber aorta without aneurysm, dissection, vasculitis or significant stenosis. Celiac: Patent without evidence of aneurysm, dissection, vasculitis or significant stenosis. SMA: Patent without evidence of aneurysm, dissection, vasculitis or significant stenosis. Renals: Not well visualized due to patient motion artifact. IMA: Patent without evidence of aneurysm, dissection, vasculitis or significant stenosis. Inflow: Left common and external iliac arteries are widely patent. Right common iliac artery is widely patent. 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 Moderate focal stenosis is noted involving the proximal portion of the right external iliac artery. Veins: No obvious venous abnormality within the limitations of this arterial phase study. Review of the MIP images confirms the above findings. NON-VASCULAR Hepatobiliary: No focal liver abnormality is seen. No gallstones, gallbladder wall thickening, or biliary dilatation. Pancreas: Unremarkable. No pancreatic ductal dilatation or surrounding inflammatory changes. Spleen: Normal in size without focal abnormality. Adrenals/Urinary Tract: Adrenal  glands are unremarkable. Kidneys are normal, without renal calculi, focal lesion, or hydronephrosis. Bladder is unremarkable. Stomach/Bowel: Status post  appendectomy. The stomach is unremarkable. Mildly dilated small bowel loops are noted in the upper and left lower quadrant of the abdomen with possible transition zone seen in the ileum. Slight enhancement of the wall of the terminal ileum is noted, although no surrounding inflammatory changes are noted. No colonic dilatation is noted. Lymphatic: No significant adenopathy is noted. Reproductive: Prostate is unremarkable. Other: No abdominal wall hernia or abnormality. No abdominopelvic ascites. Musculoskeletal: No acute or significant osseous findings. Review of the MIP images confirms the above findings. IMPRESSION: 1. There is no evidence of thoracic or abdominal aortic dissection or aneurysm. 2. 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 3. Moderate focal stenosis is noted involving the proximal portion of the right external iliac artery. 4. Mildly dilated small bowel loops are noted in the upper and left lower quadrant of the abdomen with possible transition zone seen in the ileum. Slight enhancement of the wall of the terminal ileum is noted, although no surrounding inflammatory changes are noted. This may represent some degree of enteritis, but early or partial small bowel obstruction cannot be excluded. Emphysema (ICD10-J43.9). Electronically Signed   By: Marijo Conception M.D.   On: 03/08/2020 09:37   ECHOCARDIOGRAM LIMITED  Result Date: 03/08/2020    ECHOCARDIOGRAM REPORT   Patient Name:   Martin [name] [facility] Date of Exam: 03/08/2020 Medical Rec #:  [id number]       Height:       70.0 in Accession #:    [id number]      Weight:       145.0 lb Date of Birth:  11-09-1960       BSA:          1.821 m Patient Age:    59 years        BP:           141/96 mmHg Patient Gender: M               HR:           90 bpm. Exam Location:  Inpatient Procedure: Limited  Echo Indications:    Preoperative evaluation  History:        Patient has no prior history of Echocardiogram examinations.                 PAD, Aortic Valve Disease; Risk Factors:Hypertension and Current                 Smoker. Abdominal pain, Small bowel obstruction due to                 adhesions, polysubstance abuse.  Sonographer:    Darlina Sicilian RDCS Referring Phys: [id number] [facility]  Sonographer Comments: Image acquisition challenging due to uncooperative patient. Study was 6 clips. Limited echo performed. IMPRESSIONS  1. Left ventricular ejection fraction, by estimation, is 60 to 65%. The left ventricle has normal function.  2. The mitral valve is grossly normal. No evidence of mitral valve regurgitation. FINDINGS  Left Ventricle: Left ventricular ejection fraction, by estimation, is 60 to 65%. The left ventricle has normal function. Mitral Valve: The mitral valve is grossly normal. No evidence of mitral valve regurgitation. Cherlynn Kaiser MD Electronically signed by Cherlynn Kaiser MD Signature Date/Time: 03/08/2020/5:26:12 PM    Final       Subjective:   Discharge Exam: Vitals:   03/09/20 0007 03/09/20 0414  BP: (!) 171/64 (!) 165/82  Pulse: 79 72  Resp: 19 20  Temp: 98.8  F (37.1 C) 97.7 F (36.5 C)  SpO2: 99% 99%   Vitals:   03/08/20 1956 03/09/20 0007 03/09/20 0414 03/09/20 0500  BP: (!) 178/65 (!) 171/64 (!) 165/82   Pulse: 84 79 72   Resp: (!) 22 19 20    Temp: 98.1 F (36.7 C) 98.8 F (37.1 C) 97.7 F (36.5 C)   TempSrc: Oral Oral Oral   SpO2: 98% 99% 99%   Weight:    62.5 kg  Height:          The results of significant diagnostics from this hospitalization (including imaging, microbiology, ancillary and laboratory) are listed below for reference.     Microbiology: Recent Results (from the past 240 hour(s))  SARS Coronavirus 2 by RT PCR (hospital order, performed in [facility] hospital lab) Nasopharyngeal Nasopharyngeal Swab     Status: None   Collection  Time: 03/08/20 12:21 PM   Specimen: Nasopharyngeal Swab  Result Value Ref Range Status   SARS Coronavirus 2 NEGATIVE NEGATIVE Final    Comment: (NOTE) SARS-CoV-2 target nucleic acids are NOT DETECTED.  The SARS-CoV-2 RNA is generally detectable in upper and lower respiratory specimens during the acute phase of infection. The lowest concentration of SARS-CoV-2 viral copies this assay can detect is 250 copies / mL. A negative result does not preclude SARS-CoV-2 infection and should not be used as the sole basis for treatment or other patient management decisions.  A negative result may occur with improper specimen collection / handling, submission of specimen other than nasopharyngeal swab, presence of viral mutation(s) within the areas targeted by this assay, and inadequate number of viral copies (<250 copies / mL). A negative result must be combined with clinical observations, patient history, and epidemiological information.  Fact Sheet for Patients:   StrictlyIdeas.no  Fact Sheet for Healthcare Providers: BankingDealers.co.za  This test is not yet approved or  cleared by the [place] FDA and has been authorized for detection and/or diagnosis of SARS-CoV-2 by FDA under an Emergency Use Authorization (EUA).  This EUA will remain in effect (meaning this test can be used) for the duration of the COVID-19 declaration under Section 564(b)(1) of the Act, 21 U.S.C. section 360bbb-3(b)(1), unless the authorization is terminated or revoked sooner.  Performed at [facility], [place] [address] [place], [place] [address]      Labs: BNP (last 3 results) No results for input(s): BNP in the last 8760 hours. Basic Metabolic Panel: Recent Labs  Lab 03/08/20 0752 03/08/20 0811 03/09/20 0317  NA 133* 138 133*  K 4.4 3.6 3.7  CL 98 104 99  CO2 24  --  23  GLUCOSE 106* 100* 110*  BUN 12 11 10   CREATININE 0.74 0.40*  0.80  CALCIUM 9.5  --  8.5*   Liver Function Tests: Recent Labs  Lab 03/08/20 0752 03/09/20 0317  AST 129* 73*  ALT 232* 166*  ALKPHOS 129* 104  BILITOT 1.5* 1.3*  PROT 8.0 6.8  ALBUMIN 4.1 3.5   Recent Labs  Lab 03/08/20 0752  LIPASE 21   No results for input(s): AMMONIA in the last 168 hours. CBC: Recent Labs  Lab 03/08/20 0752 03/08/20 0811 03/09/20 0317  WBC 15.7*  --  6.4  NEUTROABS 13.8*  --   --   HGB 15.6 13.3 14.9  HCT 44.5 39.0 42.3  MCV 89.0  --  86.7  PLT 193  --  180   Cardiac Enzymes: No results for input(s): CKTOTAL, CKMB, CKMBINDEX, TROPONINI in  the last 168 hours. BNP: Invalid input(s): POCBNP CBG: Recent Labs  Lab 03/09/20 0741  GLUCAP 89   D-Dimer No results for input(s): DDIMER in the last 72 hours. Hgb A1c No results for input(s): HGBA1C in the last 72 hours. Lipid Profile No results for input(s): CHOL, HDL, LDLCALC, TRIG, CHOLHDL, LDLDIRECT in the last 72 hours. Thyroid function studies No results for input(s): TSH, T4TOTAL, T3FREE, THYROIDAB in the last 72 hours.  Invalid input(s): FREET3 Anemia work up No results for input(s): VITAMINB12, FOLATE, FERRITIN, TIBC, IRON, RETICCTPCT in the last 72 hours. Urinalysis    Component Value Date/Time   COLORURINE YELLOW 03/08/2020 [place] 03/08/2020 0931   LABSPEC 1.024 03/08/2020 0931   LABSPEC 1.015 03/02/2012 1620   PHURINE 7.0 03/08/2020 0931   GLUCOSEU NEGATIVE 03/08/2020 0931   GLUCOSEU NEGATIVE 03/19/2012 1426   HGBUR NEGATIVE 03/08/2020 0931   BILIRUBINUR NEGATIVE 03/08/2020 0931   BILIRUBINUR NEG 08/12/2012 1631   BILIRUBINUR Negative 03/02/2012 1620   KETONESUR 20 (A) 03/08/2020 0931   PROTEINUR NEGATIVE 03/08/2020 0931   UROBILINOGEN 0.2 07/07/2015 2056   NITRITE NEGATIVE 03/08/2020 0931   LEUKOCYTESUR NEGATIVE 03/08/2020 0931   LEUKOCYTESUR Negative 03/02/2012 1620   Sepsis Labs Invalid input(s): PROCALCITONIN,  WBC,   LACTICIDVEN Microbiology Recent Results (from the past 240 hour(s))  SARS Coronavirus 2 by RT PCR (hospital order, performed in [place] hospital lab) Nasopharyngeal Nasopharyngeal Swab     Status: None   Collection Time: 03/08/20 12:21 PM   Specimen: Nasopharyngeal Swab  Result Value Ref Range Status   SARS Coronavirus 2 NEGATIVE NEGATIVE Final    Comment: (NOTE) SARS-CoV-2 target nucleic acids are NOT DETECTED.  The SARS-CoV-2 RNA is generally detectable in upper and lower respiratory specimens during the acute phase of infection. The lowest concentration of SARS-CoV-2 viral copies this assay can detect is 250 copies / mL. A negative result does not preclude SARS-CoV-2 infection and should not be used as the sole basis for treatment or other patient management decisions.  A negative result may occur with improper specimen collection / handling, submission of specimen other than nasopharyngeal swab, presence of viral mutation(s) within the areas targeted by this assay, and inadequate number of viral copies (<250 copies / mL). A negative result must be combined with clinical observations, patient history, and epidemiological information.  Fact Sheet for Patients:   StrictlyIdeas.no  Fact Sheet for Healthcare Providers: BankingDealers.co.za  This test is not yet approved or  cleared by the [place] FDA and has been authorized for detection and/or diagnosis of SARS-CoV-2 by FDA under an Emergency Use Authorization (EUA).  This EUA will remain in effect (meaning this test can be used) for the duration of the COVID-19 declaration under Section 564(b)(1) of the Act, 21 U.S.C. section 360bbb-3(b)(1), unless the authorization is terminated or revoked sooner.  Performed at [facility], [place] [address] [place], [place] [address]     Please note: You were cared for by a hospitalist during your hospital stay.  Once you are discharged, your primary care physician will handle any further medical issues. Please note that NO REFILLS for any discharge medications will be authorized once you are discharged, as it is imperative that you return to your primary care physician (or establish a relationship with a primary care physician if you do not have one) for your post hospital discharge needs so that they can reassess your need for medications and monitor your lab values.  Time coordinating discharge: 40 minutes  SIGNED:   Shelly Coss, MD  [facility] 03/09/2020, 2:48 PM Pager [phone]  If 7PM-7AM, please contact night-coverage [email or web address] Password [email or web address]

## 2020-03-09 NOTE — Progress Notes (Signed)
Nausea /generalized pain/anxiety throughout night. Good relief with dilauded/ativan Small amt bilious emesis at 0630, phenergan given, Large bowel movement at 0500

## 2020-03-09 NOTE — Progress Notes (Signed)
Patient called nurse into the room and stated that he had to leave. That he had to go check on his 61 year old mother. Sent MD a secure chat and he stated that he would not give discharge orders.  Informed him that MD was not going to give discharge orders and that he would need to sign a AMA form. Patient agreed to sign AMA forms.   Patient signed AMA forms and I took out both IV's and patient is aware that he will have to escort himself out of hospital that nursing staff can no longer provide care.

## 2020-03-09 NOTE — Progress Notes (Signed)
[facility] Progress Note     Subjective: Patient denies abdominal pain currently. Vomited this AM but also had large BM this AM. Denies feeling bloated.    Objective: Vital signs in last 24 hours: Temp:  [97.7 F (36.5 C)-98.8 F (37.1 C)] 97.7 F (36.5 C) (07/02 0414) Pulse Rate:  [67-127] 72 (07/02 0414) Resp:  [14-38] 20 (07/02 0414) BP: (135-178)/(60-96) 165/82 (07/02 0414) SpO2:  [89 %-100 %] 99 % (07/02 0414) Weight:  [62.5 kg] 62.5 kg (07/02 0500)    Intake/Output from previous day: 07/01 0701 - 07/02 0700 In: 3664 [I.V.:1464; IV Piggyback:2200] Out: 1200 [Urine:1200] Intake/Output this shift: No intake/output data recorded.  PE: General: pleasant, WD, cachectic white male who is laying in bed in NAD HEENT: Sclera are noninjected.  PERRL.  Ears and nose without any masses or lesions.  Very poor dentition with multiple teeth missing and multiple broken teeth with obvious caries Heart: sinus tachycardia in the low 100s.  Normal s1,s2. No obvious murmurs, gallops, or rubs noted.  Bounding pedal pulses bilaterally Lungs: CTAB, no wheezes, rhonchi, or rales noted.  Respiratory effort nonlabored Abd: soft, non-tender, no peritonitis or guarding, ND, +BS, no masses, hernias, or organomegaly, midline surgical scar MS: all 4 extremities are symmetrical with no cyanosis, clubbing, or edema. Skin: warm and dry with no masses, lesions, or rashes Neuro: Cranial nerves 2-12 grossly intact, sensation is grossly intact throughout Psych: A&Ox3 with a very anxious affect.    Lab Results:  Recent Labs    03/08/20 0752 03/08/20 0752 03/08/20 0811 03/09/20 0317  WBC 15.7*  --   --  6.4  HGB 15.6   < > 13.3 14.9  HCT 44.5   < > 39.0 42.3  PLT 193  --   --  180   < > = values in this interval not displayed.   BMET Recent Labs    03/08/20 0752 03/08/20 0752 03/08/20 0811 03/09/20 0317  NA 133*   < > 138 133*  K 4.4   < > 3.6 3.7  CL 98   < > 104 99  CO2 24   --   --  23  GLUCOSE 106*   < > 100* 110*  BUN 12   < > 11 10  CREATININE 0.74   < > 0.40* 0.80  CALCIUM 9.5  --   --  8.5*   < > = values in this interval not displayed.   PT/INR No results for input(s): LABPROT, INR in the last 72 hours. CMP     Component Value Date/Time   NA 133 (L) 03/09/2020 0317   NA 141 10/19/2014 1204   K 3.7 03/09/2020 0317   K 4.6 10/19/2014 1204   CL 99 03/09/2020 0317   CO2 23 03/09/2020 0317   CO2 26 10/19/2014 1204   GLUCOSE 110 (H) 03/09/2020 0317   GLUCOSE 91 10/19/2014 1204   BUN 10 03/09/2020 0317   BUN 10.8 10/19/2014 1204   CREATININE 0.80 03/09/2020 0317   CREATININE 0.83 07/05/2015 1042   CREATININE 0.9 10/19/2014 1204   CALCIUM 8.5 (L) 03/09/2020 0317   CALCIUM 9.6 10/19/2014 1204   PROT 6.8 03/09/2020 0317   PROT 7.3 10/19/2014 1204   ALBUMIN 3.5 03/09/2020 0317   ALBUMIN 4.0 10/19/2014 1204   AST 73 (H) 03/09/2020 0317   AST 15 10/19/2014 1204   ALT 166 (H) 03/09/2020 0317   ALT 13 10/19/2014 1204   ALKPHOS 104 03/09/2020 0317  ALKPHOS 83 10/19/2014 1204   BILITOT 1.3 (H) 03/09/2020 0317   BILITOT 0.22 10/19/2014 1204   GFRNONAA >60 03/09/2020 0317   GFRNONAA >89 07/05/2015 1042   GFRAA >60 03/09/2020 0317   GFRAA >89 07/05/2015 1042   Lipase     Component Value Date/Time   LIPASE 21 03/08/2020 0752       Studies/Results: DG Abd Portable 1V  Result Date: 03/09/2020 CLINICAL DATA:  Abdominal distension. EXAM: PORTABLE ABDOMEN - 1 VIEW COMPARISON:  CT scan 10/21/2019 FINDINGS: The visualized lung bases are grossly clear. Dilated small bowel loops are noted. No obvious free air on this portable supine film. There is some air in stool in the right colon but otherwise the colon is relatively decompressed. The bony structures are unremarkable. IMPRESSION: Small-bowel obstruction bowel gas pattern.  No obvious free air. Electronically Signed   By: Marijo Sanes M.D.   On: 03/09/2020 06:16   CT Angio Chest/Abd/Pel for  Dissection W and/or Wo Contrast  Result Date: 03/08/2020 CLINICAL DATA:  Abdominal pain. EXAM: CT ANGIOGRAPHY CHEST, ABDOMEN AND PELVIS TECHNIQUE: Non-contrast CT of the chest was initially obtained. Multidetector CT imaging through the chest, abdomen and pelvis was performed using the standard protocol during bolus administration of intravenous contrast. Multiplanar reconstructed images and MIPs were obtained and reviewed to evaluate the vascular anatomy. CONTRAST:  173m OMNIPAQUE IOHEXOL 350 MG/ML SOLN COMPARISON:  October 21, 2019. FINDINGS: CTA CHEST FINDINGS Cardiovascular: Preferential opacification of the thoracic aorta. No evidence of thoracic aortic aneurysm or dissection. Normal heart size. No pericardial effusion. Mediastinum/Nodes: No enlarged mediastinal, hilar, or axillary lymph nodes. Thyroid gland, trachea, and esophagus demonstrate no significant findings. Lungs/Pleura: No pneumothorax or pleural effusion is noted. Mild biapical scarring is noted. Mild emphysematous bulla is noted in both lung apices is well. No acute abnormality is noted. Musculoskeletal: No chest wall abnormality. No acute or significant osseous findings. Review of the MIP images confirms the above findings. CTA ABDOMEN AND PELVIS FINDINGS VASCULAR Aorta: Normal caliber aorta without aneurysm, dissection, vasculitis or significant stenosis. Celiac: Patent without evidence of aneurysm, dissection, vasculitis or significant stenosis. SMA: Patent without evidence of aneurysm, dissection, vasculitis or significant stenosis. Renals: Not well visualized due to patient motion artifact. IMA: Patent without evidence of aneurysm, dissection, vasculitis or significant stenosis. Inflow: Left common and external iliac arteries are widely patent. Right common iliac artery is widely patent. 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 Moderate focal stenosis is noted involving the proximal portion of the right external iliac artery.  Veins: No obvious venous abnormality within the limitations of this arterial phase study. Review of the MIP images confirms the above findings. NON-VASCULAR Hepatobiliary: No focal liver abnormality is seen. No gallstones, gallbladder wall thickening, or biliary dilatation. Pancreas: Unremarkable. No pancreatic ductal dilatation or surrounding inflammatory changes. Spleen: Normal in size without focal abnormality. Adrenals/Urinary Tract: Adrenal glands are unremarkable. Kidneys are normal, without renal calculi, focal lesion, or hydronephrosis. Bladder is unremarkable. Stomach/Bowel: Status post appendectomy. The stomach is unremarkable. Mildly dilated small bowel loops are noted in the upper and left lower quadrant of the abdomen with possible transition zone seen in the ileum. Slight enhancement of the wall of the terminal ileum is noted, although no surrounding inflammatory changes are noted. No colonic dilatation is noted. Lymphatic: No significant adenopathy is noted. Reproductive: Prostate is unremarkable. Other: No abdominal wall hernia or abnormality. No abdominopelvic ascites. Musculoskeletal: No acute or significant osseous findings. Review of the MIP images confirms the  above findings. IMPRESSION: 1. There is no evidence of thoracic or abdominal aortic dissection or aneurysm. 2. 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 3. Moderate focal stenosis is noted involving the proximal portion of the right external iliac artery. 4. Mildly dilated small bowel loops are noted in the upper and left lower quadrant of the abdomen with possible transition zone seen in the ileum. Slight enhancement of the wall of the terminal ileum is noted, although no surrounding inflammatory changes are noted. This may represent some degree of enteritis, but early or partial small bowel obstruction cannot be excluded. Emphysema (ICD10-J43.9). Electronically Signed   By: Marijo Conception M.D.   On: 03/08/2020 09:37    ECHOCARDIOGRAM LIMITED  Result Date: 03/08/2020    ECHOCARDIOGRAM REPORT   Patient Name:   [name] Date of Exam: 03/08/2020 Medical Rec #:  [id number]       Height:       70.0 in Accession #:    [id number]      Weight:       145.0 lb Date of Birth:  [birth year]       BSA:          1.821 m Patient Age:    30 years        BP:           141/96 mmHg Patient Gender: M               HR:           90 bpm. Exam Location:  Inpatient Procedure: Limited Echo Indications:    Preoperative evaluation  History:        Patient has no prior history of Echocardiogram examinations.                 PAD, Aortic Valve Disease; Risk Factors:Hypertension and Current                 Smoker. Abdominal pain, Small bowel obstruction due to                 adhesions, polysubstance abuse.  Sonographer:    Darlina Sicilian RDCS Referring Phys: [id number] [facility]  Sonographer Comments: Image acquisition challenging due to uncooperative patient. Study was 6 clips. Limited echo performed. IMPRESSIONS  1. Left ventricular ejection fraction, by estimation, is 60 to 65%. The left ventricle has normal function.  2. The mitral valve is grossly normal. No evidence of mitral valve regurgitation. FINDINGS  Left Ventricle: Left ventricular ejection fraction, by estimation, is 60 to 65%. The left ventricle has normal function. Mitral Valve: The mitral valve is grossly normal. No evidence of mitral valve regurgitation. 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 - pt has good distal pulses, recommend outpatient vascular follow up   Abdominal pain ?PSBO vs enteritis - CT 7/1 shows mildly dilated loops of small bowel and slight  enhancement of wall of terminal ileum, possible transition in terminal ileum - WBC 6.4 from 15 - patient abdominal exam benign, vomited this AM but also had a large BM - no emergent surgical intervention at this time - recommend bowel rest with ice chips and sips of clears only - I don't think patient needs or would tolerate NGT placement at this time, may hold off for now unless patient starts vomiting - consider PO small bowel protocol - will discuss with MD - we will follow   FEN: NPO, IVF VTE: ok to have chemical prophylaxis from a surgery standpoint ID: [id number] 7/1>>  LOS: 1 day    Norm Parcel , [facility] 03/09/2020, 8:22 AM Please see Amion for pager number during day hours 7:00am-4:30pm

## 2020-03-09 NOTE — Progress Notes (Signed)
PT Cancellation Note  Patient Details Name: NYRON MOZER MRN: [id number] DOB: [birth year]   Cancelled Treatment:    Reason Eval/Treat Not Completed: Other (comment) Pt only grunted acknowledgement of PT presence once then remained quiet and kept eyes closed.  Pt encouraged to awaken and participate however pt remained with eyes closed.  RN reports pt had little sleep last night.  Will check back as schedule permits.   Calandra Madura,KATHrine E 03/09/2020, 9:22 AM  Arlyce Dice, DPT Acute Rehabilitation Services Pager: [phone] Office: [phone]

## 2020-03-09 NOTE — Progress Notes (Signed)
PROGRESS NOTE    Martin [name]  DXI:[id number] DOB: May 28, 1961 DOA: 03/08/2020 PCP: Patient, No Pcp Per   Brief Narrative:  Patient is a 59 year old male with history of colon cancer status post resection, hypertension, GERD presents to the emergency department with complaints of abdominal pain.  He describes abdominal pain is diffuse and it got worse on the day before admission.  He was also reporting of not having bowel movement for last 3 days. On presentation, he was tachycardic, hypertensive, leukocytosis .  CT imaging showed dilated small bowel loops.  Patient was admitted for the management of some bowel obstruction present surgery following.  Assessment & Plan:   Principal Problem:   Small bowel obstruction due to adhesions [facility]) Active Problems:   Anal cancer (HCC)   Folate deficiency   GERD (gastroesophageal reflux disease)   Hypertension   Anxiety   Arthritis   Chronic pain syndrome   Depression   Abnormal liver function  Abdominal pain/nausea/vomiting: Secondary to SBO.  CT imaging showed mildly dilated small bowel loops in the upper and left lower quadrant of the abdomen with possible transition zone seen in the ileum.  There was also suspicion for enteritis , so started on antibiotics also.  Currently on Zosyn General surgery following.  Started on conservative management.  NPO. Abdominal x-ray this morning showed persistent SBO. Continue pain medications, antiemetics.  Hypertension: Not on  Medications at home.  Continue as needed meds for severe hypertension.  Continue to monitor blood pressure.  If he remains hypertensive, will start on antihypertensives  Elevated liver enzymes: On reviewing his previous lab works, he has chronically mildly elevated liver enzymes.  Liver enzymes are improving.  Will check hepatitis panel  History of anal cancer: Status post resection.  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  Followed in outpatient vascular surgery.           DVT prophylaxis:Lovenox Code Status: Full Family Communication: None present at the bedside Status is: Inpatient  Remains inpatient appropriate because:IV treatments appropriate due to intensity of illness or inability to take PO   Dispo: The patient is from: Home              Anticipated d/c is to: Home              Anticipated d/c date is: 1-2 days              Patient currently is not medically stable to d/c.     Consultants: General surgery  Procedures: None  Antimicrobials:  Anti-infectives (From admission, onward)   Start     Dose/Rate Route Frequency Ordered Stop   03/08/20 1200  piperacillin-tazobactam (ZOSYN) IVPB 3.375 g     Discontinue     3.375 g 12.5 mL/hr over 240 Minutes Intravenous Every 8 hours 03/08/20 1112        Subjective: Patient seen and examined at the bedside this morning.  Hemodynamically stable.  He said he had a bowel movement.  Denies any nausea, vomiting or belly pain.  Objective: Vitals:   03/08/20 1956 03/09/20 0007 03/09/20 0414 03/09/20 0500  BP: (!) 178/65 (!) 171/64 (!) 165/82   Pulse: 84 79 72   Resp: (!) 22 19 20    Temp: 98.1 F (36.7 C) 98.8 F (37.1 C) 97.7 F (36.5 C)   TempSrc: Oral Oral Oral   SpO2: 98% 99% 99%   Weight:    62.5 kg  Height:        Intake/Output Summary (Last 24 hours) at 03/09/2020 0820 Last data filed at 03/09/2020 0600 Gross per 24 hour  Intake 3664 ml  Output 1200 ml  Net 2464 ml   Filed Weights   03/08/20 0726 03/09/20 0500  Weight: 65.8 kg 62.5 kg    Examination:  General exam: anxious HEENT:PERRL,Oral mucosa moist, Ear/Nose normal on gross exam Respiratory system: Bilateral equal air entry, normal vesicular breath sounds, no wheezes or crackles  Cardiovascular system: S1 & S2 heard, RRR. No JVD, murmurs, rubs, gallops or clicks. No pedal edema. Gastrointestinal system: Abdomen is nondistended, soft and nontender. No  organomegaly or masses felt. Normal bowel sounds heard. Central nervous system: Alert and oriented. No focal neurological deficits.Anxious Extremities: No edema, no clubbing ,no cyanosis, distal peripheral pulses palpable. Skin: tattods   Data Reviewed: I have personally reviewed following labs and imaging studies  CBC: Recent Labs  Lab 03/08/20 0752 03/08/20 0811 03/09/20 0317  WBC 15.7*  --  6.4  NEUTROABS 13.8*  --   --   HGB 15.6 13.3 14.9  HCT 44.5 39.0 42.3  MCV 89.0  --  86.7  PLT 193  --  629   Basic Metabolic Panel: Recent Labs  Lab 03/08/20 0752 03/08/20 0811 03/09/20 0317  NA 133* 138 133*  K 4.4 3.6 3.7  CL 98 104 99  CO2 24  --  23  GLUCOSE 106* 100* 110*  BUN 12 11 10   CREATININE 0.74 0.40* 0.80  CALCIUM 9.5  --  8.5*   GFR: Estimated Creatinine Clearance: 85.7 mL/min (by C-G formula based on SCr of 0.8 mg/dL). Liver Function Tests: Recent Labs  Lab 03/08/20 0752 03/09/20 0317  AST 129* 73*  ALT 232* 166*  ALKPHOS 129* 104  BILITOT 1.5* 1.3*  PROT 8.0 6.8  ALBUMIN 4.1 3.5   Recent Labs  Lab 03/08/20 0752  LIPASE 21   No results for input(s): AMMONIA in the last 168 hours. Coagulation Profile: No results for input(s): INR, PROTIME in the last 168 hours. Cardiac Enzymes: No results for input(s): CKTOTAL, CKMB, CKMBINDEX, TROPONINI in the last 168 hours. BNP (last 3 results) No results for input(s): PROBNP in the last 8760 hours. HbA1C: No results for input(s): HGBA1C in the last 72 hours. CBG: Recent Labs  Lab 03/09/20 0741  GLUCAP 89   Lipid Profile: No results for input(s): CHOL, HDL, LDLCALC, TRIG, CHOLHDL, LDLDIRECT in the last 72 hours. Thyroid Function Tests: No results for input(s): TSH, T4TOTAL, FREET4, T3FREE, THYROIDAB in the last 72 hours. Anemia Panel: No results for input(s): VITAMINB12, FOLATE, FERRITIN, TIBC, IRON, RETICCTPCT in the last 72 hours. Sepsis Labs: No results for input(s): PROCALCITON, LATICACIDVEN in  the last 168 hours.  Recent Results (from the past 240 hour(s))  SARS Coronavirus 2 by RT PCR (hospital order, performed in [facility] hospital lab) Nasopharyngeal Nasopharyngeal Swab     Status: None   Collection Time: 03/08/20 12:21 PM   Specimen: Nasopharyngeal Swab  Result Value Ref Range Status   SARS Coronavirus 2 NEGATIVE NEGATIVE Final    Comment: (NOTE) SARS-CoV-2 target nucleic acids are NOT DETECTED.  The SARS-CoV-2 RNA is generally detectable in upper and lower respiratory specimens during the acute phase of infection. The lowest concentration of SARS-CoV-2 viral copies this assay can detect is 250 copies / mL. A negative result does not preclude SARS-CoV-2 infection and should not be used as the sole basis for treatment or other patient management  decisions.  A negative result may occur with improper specimen collection / handling, submission of specimen other than nasopharyngeal swab, presence of viral mutation(s) within the areas targeted by this assay, and inadequate number of viral copies (<250 copies / mL). A negative result must be combined with clinical observations, patient history, and epidemiological information.  Fact Sheet for Patients:   StrictlyIdeas.no  Fact Sheet for Healthcare Providers: BankingDealers.co.za  This test is not yet approved or  cleared by the [place] FDA and has been authorized for detection and/or diagnosis of SARS-CoV-2 by FDA under an Emergency Use Authorization (EUA).  This EUA will remain in effect (meaning this test can be used) for the duration of the COVID-19 declaration under Section 564(b)(1) of the Act, 21 U.S.C. section 360bbb-3(b)(1), unless the authorization is terminated or revoked sooner.  Performed at [facility], [place] [address], [place], [place] [address]          Radiology Studies: DG Abd Portable 1V  Result Date: 03/09/2020 CLINICAL  DATA:  Abdominal distension. EXAM: PORTABLE ABDOMEN - 1 VIEW COMPARISON:  CT scan 10/21/2019 FINDINGS: The visualized lung bases are grossly clear. Dilated small bowel loops are noted. No obvious free air on this portable supine film. There is some air in stool in the right colon but otherwise the colon is relatively decompressed. The bony structures are unremarkable. IMPRESSION: Small-bowel obstruction bowel gas pattern.  No obvious free air. Electronically Signed   By: Marijo Sanes M.D.   On: 03/09/2020 06:16   CT Angio Chest/Abd/Pel for Dissection W and/or Wo Contrast  Result Date: 03/08/2020 CLINICAL DATA:  Abdominal pain. EXAM: CT ANGIOGRAPHY CHEST, ABDOMEN AND PELVIS TECHNIQUE: Non-contrast CT of the chest was initially obtained. Multidetector CT imaging through the chest, abdomen and pelvis was performed using the standard protocol during bolus administration of intravenous contrast. Multiplanar reconstructed images and MIPs were obtained and reviewed to evaluate the vascular anatomy. CONTRAST:  148m OMNIPAQUE IOHEXOL 350 MG/ML SOLN COMPARISON:  October 21, 2019. FINDINGS: CTA CHEST FINDINGS Cardiovascular: Preferential opacification of the thoracic aorta. No evidence of thoracic aortic aneurysm or dissection. Normal heart size. No pericardial effusion. Mediastinum/Nodes: No enlarged mediastinal, hilar, or axillary lymph nodes. Thyroid gland, trachea, and esophagus demonstrate no significant findings. Lungs/Pleura: No pneumothorax or pleural effusion is noted. Mild biapical scarring is noted. Mild emphysematous bulla is noted in both lung apices is well. No acute abnormality is noted. Musculoskeletal: No chest wall abnormality. No acute or significant osseous findings. Review of the MIP images confirms the above findings. CTA ABDOMEN AND PELVIS FINDINGS VASCULAR Aorta: Normal caliber aorta without aneurysm, dissection, vasculitis or significant stenosis. Celiac: Patent without evidence of aneurysm,  dissection, vasculitis or significant stenosis. SMA: Patent without evidence of aneurysm, dissection, vasculitis or significant stenosis. Renals: Not well visualized due to patient motion artifact. IMA: Patent without evidence of aneurysm, dissection, vasculitis or significant stenosis. Inflow: Left common and external iliac arteries are widely patent. Right common iliac artery is widely patent. 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 Moderate focal stenosis is noted involving the proximal portion of the right external iliac artery. Veins: No obvious venous abnormality within the limitations of this arterial phase study. Review of the MIP images confirms the above findings. NON-VASCULAR Hepatobiliary: No focal liver abnormality is seen. No gallstones, gallbladder wall thickening, or biliary dilatation. Pancreas: Unremarkable. No pancreatic ductal dilatation or surrounding inflammatory changes. Spleen: Normal in size without focal abnormality. Adrenals/Urinary Tract: Adrenal glands are unremarkable. Kidneys  are normal, without renal calculi, focal lesion, or hydronephrosis. Bladder is unremarkable. Stomach/Bowel: Status post appendectomy. The stomach is unremarkable. Mildly dilated small bowel loops are noted in the upper and left lower quadrant of the abdomen with possible transition zone seen in the ileum. Slight enhancement of the wall of the terminal ileum is noted, although no surrounding inflammatory changes are noted. No colonic dilatation is noted. Lymphatic: No significant adenopathy is noted. Reproductive: Prostate is unremarkable. Other: No abdominal wall hernia or abnormality. No abdominopelvic ascites. Musculoskeletal: No acute or significant osseous findings. Review of the MIP images confirms the above findings. IMPRESSION: 1. There is no evidence of thoracic or abdominal aortic dissection or aneurysm. 2. 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 3. Moderate focal stenosis is  noted involving the proximal portion of the right external iliac artery. 4. Mildly dilated small bowel loops are noted in the upper and left lower quadrant of the abdomen with possible transition zone seen in the ileum. Slight enhancement of the wall of the terminal ileum is noted, although no surrounding inflammatory changes are noted. This may represent some degree of enteritis, but early or partial small bowel obstruction cannot be excluded. Emphysema (ICD10-J43.9). Electronically Signed   By: Marijo Conception M.D.   On: 03/08/2020 09:37   ECHOCARDIOGRAM LIMITED  Result Date: 03/08/2020    ECHOCARDIOGRAM REPORT   Patient Name:   Martin [name] [facility] Date of Exam: 03/08/2020 Medical Rec #:  [id number]       Height:       70.0 in Accession #:    [id number]      Weight:       145.0 lb Date of Birth:  23-Nov-1960       BSA:          1.821 m Patient Age:    62 years        BP:           141/96 mmHg Patient Gender: M               HR:           90 bpm. Exam Location:  Inpatient Procedure: Limited Echo Indications:    Preoperative evaluation  History:        Patient has no prior history of Echocardiogram examinations.                 PAD, Aortic Valve Disease; Risk Factors:Hypertension and Current                 Smoker. Abdominal pain, Small bowel obstruction due to                 adhesions, polysubstance abuse.  Sonographer:    Darlina Sicilian RDCS Referring Phys: [id number] [facility]  Sonographer Comments: Image acquisition challenging due to uncooperative patient. Study was 6 clips. Limited echo performed. IMPRESSIONS  1. Left ventricular ejection fraction, by estimation, is 60 to 65%. The left ventricle has normal function.  2. The mitral valve is grossly normal. No evidence of mitral valve regurgitation. FINDINGS  Left Ventricle: Left ventricular ejection fraction, by estimation, is 60 to 65%. The left ventricle has normal function. Mitral Valve: The mitral valve is grossly normal. No evidence of mitral valve  regurgitation. Cherlynn Kaiser MD Electronically signed by Cherlynn Kaiser MD Signature Date/Time: 03/08/2020/5:26:12 PM    Final         Scheduled Meds: . docusate sodium  100 mg Oral BID  .  enoxaparin (LOVENOX) injection  40 mg Subcutaneous Q24H  . folic acid  1 mg Oral Daily  . pantoprazole  40 mg Oral Daily  . sucralfate  1 g Oral TID WC & HS  . tamsulosin  0.4 mg Oral QHS  . thiamine  100 mg Oral Daily   Continuous Infusions: . sodium chloride 100 mL/hr at 03/08/20 2003  . piperacillin-tazobactam (ZOSYN)  IV 3.375 g (03/09/20 0415)     LOS: 1 day    Time spent: More than 50% of that time was spent in counseling and/or coordination of care.      Shelly Coss, MD [facility] P7/10/2019, 8:20 AM

## 2020-03-10 ENCOUNTER — Inpatient Hospital Stay (HOSPITAL_COMMUNITY)
Admission: EM | Admit: 2020-03-10 | Discharge: 2020-03-10 | DRG: 390 | Discharge status: Against Medical Advice | Payer: Medicaid Other | Attending: Internal Medicine | Admitting: Internal Medicine

## 2020-03-10 ENCOUNTER — Other Ambulatory Visit: Payer: Self-pay

## 2020-03-10 ENCOUNTER — Encounter (HOSPITAL_COMMUNITY): Payer: Self-pay | Admitting: Emergency Medicine

## 2020-03-10 ENCOUNTER — Emergency Department (HOSPITAL_COMMUNITY): Payer: Medicaid Other

## 2020-03-10 DIAGNOSIS — K56600 Partial intestinal obstruction, unspecified as to cause: Secondary | ICD-10-CM | POA: Diagnosis present

## 2020-03-10 DIAGNOSIS — F411 Generalized anxiety disorder: Secondary | ICD-10-CM | POA: Diagnosis present

## 2020-03-10 DIAGNOSIS — I10 Essential (primary) hypertension: Secondary | ICD-10-CM | POA: Diagnosis present

## 2020-03-10 DIAGNOSIS — Z20822 Contact with and (suspected) exposure to covid-19: Secondary | ICD-10-CM | POA: Diagnosis present

## 2020-03-10 DIAGNOSIS — Z79899 Other long term (current) drug therapy: Secondary | ICD-10-CM

## 2020-03-10 DIAGNOSIS — Z800 Family history of malignant neoplasm of digestive organs: Secondary | ICD-10-CM | POA: Diagnosis not present

## 2020-03-10 DIAGNOSIS — F419 Anxiety disorder, unspecified: Secondary | ICD-10-CM | POA: Diagnosis present

## 2020-03-10 DIAGNOSIS — N529 Male erectile dysfunction, unspecified: Secondary | ICD-10-CM | POA: Diagnosis present

## 2020-03-10 DIAGNOSIS — G4700 Insomnia, unspecified: Secondary | ICD-10-CM | POA: Diagnosis present

## 2020-03-10 DIAGNOSIS — Z9049 Acquired absence of other specified parts of digestive tract: Secondary | ICD-10-CM | POA: Diagnosis not present

## 2020-03-10 DIAGNOSIS — Z923 Personal history of irradiation: Secondary | ICD-10-CM

## 2020-03-10 DIAGNOSIS — Z91030 Bee allergy status: Secondary | ICD-10-CM | POA: Diagnosis not present

## 2020-03-10 DIAGNOSIS — Z885 Allergy status to narcotic agent status: Secondary | ICD-10-CM

## 2020-03-10 DIAGNOSIS — G894 Chronic pain syndrome: Secondary | ICD-10-CM | POA: Diagnosis present

## 2020-03-10 DIAGNOSIS — F1290 Cannabis use, unspecified, uncomplicated: Secondary | ICD-10-CM | POA: Diagnosis present

## 2020-03-10 DIAGNOSIS — Z7289 Other problems related to lifestyle: Secondary | ICD-10-CM

## 2020-03-10 DIAGNOSIS — K56609 Unspecified intestinal obstruction, unspecified as to partial versus complete obstruction: Secondary | ICD-10-CM | POA: Diagnosis present

## 2020-03-10 DIAGNOSIS — F17210 Nicotine dependence, cigarettes, uncomplicated: Secondary | ICD-10-CM | POA: Diagnosis present

## 2020-03-10 DIAGNOSIS — F1490 Cocaine use, unspecified, uncomplicated: Secondary | ICD-10-CM | POA: Diagnosis present

## 2020-03-10 DIAGNOSIS — Z886 Allergy status to analgesic agent status: Secondary | ICD-10-CM | POA: Diagnosis not present

## 2020-03-10 DIAGNOSIS — K219 Gastro-esophageal reflux disease without esophagitis: Secondary | ICD-10-CM | POA: Diagnosis present

## 2020-03-10 DIAGNOSIS — Z85048 Personal history of other malignant neoplasm of rectum, rectosigmoid junction, and anus: Secondary | ICD-10-CM

## 2020-03-10 LAB — CBC WITH DIFFERENTIAL/PLATELET
Abs Immature Granulocytes: 0.02 10*3/uL (ref 0.00–0.07)
Basophils Absolute: 0 10*3/uL (ref 0.0–0.1)
Basophils Relative: 0 %
Eosinophils Absolute: 0 10*3/uL (ref 0.0–0.5)
Eosinophils Relative: 0 %
HCT: 46.5 % (ref 39.0–52.0)
Hemoglobin: 16.1 g/dL (ref 13.0–17.0)
Immature Granulocytes: 0 %
Lymphocytes Relative: 11 %
Lymphs Abs: 1.1 10*3/uL (ref 0.7–4.0)
MCH: 31 pg (ref 26.0–34.0)
MCHC: 34.6 g/dL (ref 30.0–36.0)
MCV: 89.4 fL (ref 80.0–100.0)
Monocytes Absolute: 1.1 10*3/uL — ABNORMAL HIGH (ref 0.1–1.0)
Monocytes Relative: 12 %
Neutro Abs: 7.4 10*3/uL (ref 1.7–7.7)
Neutrophils Relative %: 77 %
Platelets: 215 10*3/uL (ref 150–400)
RBC: 5.2 MIL/uL (ref 4.22–5.81)
RDW: 13.8 % (ref 11.5–15.5)
WBC: 9.7 10*3/uL (ref 4.0–10.5)
nRBC: 0 % (ref 0.0–0.2)

## 2020-03-10 LAB — COMPREHENSIVE METABOLIC PANEL
ALT: 154 U/L — ABNORMAL HIGH (ref 0–44)
AST: 58 U/L — ABNORMAL HIGH (ref 15–41)
Albumin: 3.4 g/dL — ABNORMAL LOW (ref 3.5–5.0)
Alkaline Phosphatase: 102 U/L (ref 38–126)
Anion gap: 10 (ref 5–15)
BUN: 19 mg/dL (ref 8–23)
CO2: 24 mmol/L (ref 22–32)
Calcium: 9 mg/dL (ref 8.9–10.3)
Chloride: 102 mmol/L (ref 98–111)
Creatinine, Ser: 0.83 mg/dL (ref 0.61–1.24)
GFR calc Af Amer: >60 mL/min (ref >60)
GFR calc non Af Amer: >60 mL/min (ref >60)
Glucose, Bld: 114 mg/dL — ABNORMAL HIGH (ref 70–99)
Potassium: 3.5 mmol/L (ref 3.5–5.1)
Sodium: 136 mmol/L (ref 135–145)
Total Bilirubin: 1.1 mg/dL (ref 0.3–1.2)
Total Protein: 6.5 g/dL (ref 6.5–8.1)

## 2020-03-10 LAB — SARS CORONAVIRUS 2 BY RT PCR (HOSPITAL ORDER, PERFORMED IN ~~LOC~~ HOSPITAL LAB): SARS Coronavirus 2: NEGATIVE

## 2020-03-10 LAB — LIPASE, BLOOD: Lipase: 23 U/L (ref 11–51)

## 2020-03-10 MED ORDER — LABETALOL HCL 5 MG/ML IV SOLN
10.0000 mg | INTRAVENOUS | Status: DC | PRN
  Administered 2020-03-10: 10 mg via INTRAVENOUS
  Filled 2020-03-10: qty 4

## 2020-03-10 MED ORDER — FENTANYL CITRATE (PF) 100 MCG/2ML IJ SOLN
100.0000 ug | Freq: Once | INTRAMUSCULAR | Status: AC
  Administered 2020-03-10: 100 ug via INTRAVENOUS
  Filled 2020-03-10: qty 2

## 2020-03-10 MED ORDER — HYDROMORPHONE HCL 1 MG/ML IJ SOLN
0.5000 mg | INTRAMUSCULAR | Status: DC | PRN
  Administered 2020-03-10 (×2): 1 mg via INTRAVENOUS
  Filled 2020-03-10 (×2): qty 1

## 2020-03-10 MED ORDER — SODIUM CHLORIDE 0.9 % IV SOLN: INTRAVENOUS | Status: DC

## 2020-03-10 MED ORDER — ONDANSETRON HCL 4 MG/2ML IJ SOLN
4.0000 mg | Freq: Once | INTRAMUSCULAR | Status: AC
  Administered 2020-03-10: 4 mg via INTRAVENOUS
  Filled 2020-03-10: qty 2

## 2020-03-10 MED ORDER — LACTATED RINGERS IV BOLUS
1000.0000 mL | Freq: Once | INTRAVENOUS | Status: AC
  Administered 2020-03-10: 1000 mL via INTRAVENOUS

## 2020-03-10 MED ORDER — TAMSULOSIN HCL 0.4 MG PO CAPS: 0.4000 mg | ORAL_CAPSULE | Freq: Every day | ORAL | Status: DC

## 2020-03-10 MED ORDER — ONDANSETRON HCL 4 MG/2ML IJ SOLN: 4.0000 mg | Freq: Four times a day (QID) | INTRAMUSCULAR | Status: DC | PRN

## 2020-03-10 NOTE — Plan of Care (Signed)

## 2020-03-10 NOTE — Progress Notes (Signed)
Pt left AMA. Wynetta Fines, MD notified.

## 2020-03-10 NOTE — ED Provider Notes (Signed)
[facility] Provider Note   CSN: [id number] Arrival date & time: 03/10/20  1056     History Chief Complaint  Patient presents with  . Abdominal Pain  . Emesis  . Nausea    Martin [name] is a 59 y.o. male.  HPI 59 year old male presents with abdominal pain and vomiting.  He was admitted to [facility] and left AMA yesterday for small bowel obstruction versus enteritis.  He states he had a bowel movement at home but has also been vomiting clear thick emesis.  Pain is constant and severe.  No fevers.  The patient had to leave yesterday because no one was able to take care of his deaf mother.  He states he has made arrangements and can stay if needed to be admitted.   Past Medical History:  Diagnosis Date  . Allergy 2 2013  . 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 Anxiety   . Arthritis    DDD lumbar, arthritis knees  . DJD (degenerative joint disease) of lumbar spine   . GERD (gastroesophageal reflux disease)   . Hemorrhoid    internal  . History of bowel resection   . Hypertension    EKG 12/12 EPIC   no  PCP- states increased lately but hasnt been diagnosed formally  . Inguinal hernia   . Pneumonia    as child, cough at present with no fever  . Pneumonia    as child, cough at present with no fever   . Radiation 11/19/11-01/08/12   5040 cGy 28 fx Pelvis and inguinal area    Patient Active Problem List   Diagnosis Date Noted  . Partial small bowel obstruction ([place]) 03/10/2020  . Small bowel obstruction due to adhesions ([employer]) 03/08/2020  . Enteritis 10/15/2019  . Abnormal liver function 10/15/2019  . Leukocytosis 10/15/2019  . Nausea & vomiting 10/15/2019  . GAD (generalized anxiety disorder) 05/18/2017  . 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 Constipation 04/11/2017  . Bee sting 04/05/2017  . Diverticulosis 11/29/2014  . Healthcare maintenance 11/06/2014  . Cough 10/05/2014  . Insomnia  09/13/2014  . Erectile dysfunction 11/30/2013  . Chronic pain syndrome 11/30/2013  . Depression 11/30/2013  . GERD (gastroesophageal reflux disease)   . Hypertension   . Anxiety   . Arthritis   . DJD (degenerative joint disease) of lumbar spine   . Radiation   . Night sweats 08/12/2012  . Tobacco abuse counseling 05/26/2012  . 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 B12 nutritional deficiency 03/19/2012  . Folate deficiency 03/19/2012  . Inguinal hernia 11/24/2011  . Cancer ([place]) 10/14/2011  . Internal hemorrhoid 09/11/2011  . Anal cancer ([place]) 10/09/2010    Past Surgical History:  Procedure Laterality Date  . APPENDECTOMY     age 29 or 62  . EXAMINATION UNDER ANESTHESIA  10/14/2011   Procedure: EXAM UNDER ANESTHESIA;  Surgeon: Earnstine Regal, MD;  Location: [facility];  Service: General;  Laterality: N/A;  exam under anethesia, Excision of mass anal canal, 1.5cm  . INGUINAL HERNIA REPAIR  05/11/2012   Procedure: HERNIA REPAIR INGUINAL ADULT;  Surgeon: Earnstine Regal, MD;  Location: [facility];  Service: General;  Laterality: Left;  left inguinal hernia repair with mesh  . surgical pathology   10/14/2011   squamous cell ca of anus  . transanal excision  10/14/2011   Dr.Todd Gerkin       Family History  Problem Relation Age of Onset  . Colon cancer Father   . Asthma Father   . Cancer Father        prostate  . Hypertension Father   . Asthma Mother   . Hypertension Mother   . Stomach cancer Neg Hx   . Esophageal cancer Neg Hx     Social History   Tobacco Use  . Smoking status: Current Every Day Smoker    Packs/day: 1.25    Years: 47.00    Pack years: 58.75    Types: Cigarettes  . Smokeless tobacco: Never Used  . Tobacco comment: Cutting back>> Quit Smart card supplied  Substance Use Topics  . Alcohol use: Yes    Alcohol/week: 6.0 standard drinks    Types: 6 Cans of beer per week    Comment: weekend    6pk weekend, beer   . Drug use: Yes    Types: Marijuana,  Cocaine    Comment: marijuana in past, occasionally now-last use 3-4 weeks ago    Home Medications Prior to Admission medications   Medication Sig Start Date End Date Taking? Authorizing Provider  oxyCODONE-acetaminophen (PERCOCET/ROXICET) 5-325 MG tablet Take 1 tablet by mouth every 4 (four) hours as needed for severe pain. 02/16/20  Yes Gery Pray, MD  tamsulosin (FLOMAX) 0.4 MG CAPS capsule Take 1 capsule (0.4 mg total) by mouth at bedtime. 01/18/20  Yes Gery Pray, MD  amoxicillin-clavulanate (AUGMENTIN) 875-125 MG tablet Take 1 tablet by mouth 2 (two) times daily. One po bid x 7 days Patient not taking: Reported on 11/28/2019 10/21/19   Palumbo, April, MD  EPINEPHrine (EPIPEN 2-PAK) 0.3 mg/0.3 mL IJ SOAJ injection Inject 0.3 mLs (0.3 mg total) into the muscle as needed for anaphylaxis. 02/23/20   Long, Wonda Olds, MD  omeprazole (PRILOSEC) 20 MG capsule Take 1 capsule (20 mg total) by mouth daily. Patient not taking: Reported on 11/28/2019 10/21/19   Palumbo, April, MD  sucralfate (CARAFATE) 1 GM/10ML suspension Take 10 mLs (1 g total) by mouth 4 (four) times daily -  with meals and at bedtime. Patient not taking: Reported on 11/28/2019 10/21/19   Palumbo, April, MD    Allergies    Yellow jacket venom [bee venom], Aspirin, Codeine, and Morphine and related  Review of Systems   Review of Systems  Constitutional: Negative for fever.  Gastrointestinal: Positive for abdominal pain and vomiting. Negative for diarrhea.  All other systems reviewed and are negative.   Physical Exam Updated Vital Signs BP (!) 192/82 (BP Location: Right Arm)   Pulse 72   Temp 98.1 F (36.7 C) (Oral)   Resp 20   Ht 5' 10"  (1.778 m)   Wt 65.8 kg   SpO2 99%   BMI 20.81 kg/m   Physical Exam Vitals and nursing note reviewed.  Constitutional:      General: He is in acute distress (in pain).     Appearance: He is well-developed.  HENT:     Head: Normocephalic and atraumatic.     Right Ear: External ear  normal.     Left Ear: External ear normal.     Nose: Nose normal.  Eyes:     General:        Right eye: No discharge.        Left eye: No discharge.  Cardiovascular:     Rate and Rhythm: Normal rate and regular rhythm.     Heart sounds: Normal heart sounds.  Pulmonary:     Effort: Pulmonary  effort is normal.     Breath sounds: Normal breath sounds.  Abdominal:     General: A surgical scar is present. There is distension.     Palpations: Abdomen is soft.     Tenderness: There is generalized abdominal tenderness.  Musculoskeletal:     Cervical back: Neck supple.  Skin:    General: Skin is warm and dry.  Neurological:     Mental Status: He is alert.  Psychiatric:        Mood and Affect: Mood is not anxious.     ED Results / Procedures / Treatments   Labs (all labs ordered are listed, but only abnormal results are displayed) Labs Reviewed  COMPREHENSIVE METABOLIC PANEL - Abnormal; Notable for the following components:      Result Value   Glucose, Bld 114 (*)    Albumin 3.4 (*)    AST 58 (*)    ALT 154 (*)    All other components within normal limits  CBC WITH DIFFERENTIAL/PLATELET - Abnormal; Notable for the following components:   Monocytes Absolute 1.1 (*)    All other components within normal limits  SARS CORONAVIRUS 2 BY RT PCR (HOSPITAL ORDER, [facility])  LIPASE, BLOOD    EKG None  Radiology DG ABD ACUTE 2+V W 1V CHEST  Result Date: 03/10/2020 CLINICAL DATA:  Vomiting. Abdominal pain. Small-bowel obstruction recently. EXAM: DG ABDOMEN ACUTE W/ 1V CHEST COMPARISON:  03/09/2020 FINDINGS: There is persistent significant dilatation of small bowel loops which measure up to 4.5 centimeters. Contrast is identified within loops of colon, new since the previous exam. There is no evidence for free intraperitoneal air. Heart size is normal. Lungs are clear. Mild interstitial prominence. IMPRESSION: 1. Persistent significant dilatation of small  bowel loops. 2. Contrast in nondilated loops of colon. The patient had a CT of the chest abdomen and pelvis on 03/08/2020 but no oral contrast was given. I suspect that the patient has had oral contrast for an outside study within the last 24 hours. If the patient had oral contrast for this study, the findings would exclude complete obstruction. If the patient had rectal contrast, the findings are consistent with persistent high-grade small bowel obstruction. 3. Recommend correlation with recent outside studies. Electronically Signed   By: Nolon Nations M.D.   On: 03/10/2020 12:20   DG Abd Portable 1V  Result Date: 03/09/2020 CLINICAL DATA:  Abdominal distension. EXAM: PORTABLE ABDOMEN - 1 VIEW COMPARISON:  CT scan 10/21/2019 FINDINGS: The visualized lung bases are grossly clear. Dilated small bowel loops are noted. No obvious free air on this portable supine film. There is some air in stool in the right colon but otherwise the colon is relatively decompressed. The bony structures are unremarkable. IMPRESSION: Small-bowel obstruction bowel gas pattern.  No obvious free air. Electronically Signed   By: Marijo Sanes M.D.   On: 03/09/2020 06:16    Procedures Procedures (including critical care time)  Medications Ordered in ED Medications  tamsulosin (FLOMAX) capsule 0.4 mg (has no administration in time range)  HYDROmorphone (DILAUDID) injection 0.5-1 mg (1 mg Intravenous Given 03/10/20 1633)  0.9 %  sodium chloride infusion ( Intravenous New Bag/Given 03/10/20 1522)  labetalol (NORMODYNE) injection 10 mg (10 mg Intravenous Given 03/10/20 1430)  ondansetron (ZOFRAN) injection 4 mg (has no administration in time range)  lactated ringers bolus 1,000 mL (0 mLs Intravenous Stopped 03/10/20 1313)  fentaNYL (SUBLIMAZE) injection 100 mcg (100 mcg Intravenous Given 03/10/20 1213)  ondansetron (ZOFRAN) injection 4 mg (4 mg Intravenous Given 03/10/20 1214)  fentaNYL (SUBLIMAZE) injection 100 mcg (100 mcg Intravenous  Given 03/10/20 1313)    ED Course  I have reviewed the triage vital signs and the nursing notes.  Pertinent labs & imaging results that were available during my care of the patient were reviewed by me and considered in my medical decision making (see chart for details).    MDM Rules/Calculators/A&P                          Patient was given IV pain medicine. No current vomiting. Patient drank contrast yesterday in preparation for small bowel imaging, which would explain the contrast. Given continued symptoms, will re-admit to hospitalist service. Discussed with Dr. Kieth Brightly, general surgery will consult. Final Clinical Impression(s) / ED Diagnoses Final diagnoses:  Partial small bowel obstruction [facility])    Rx / DC Orders ED Discharge Orders    None       Sherwood Gambler, MD 03/10/20 2145

## 2020-03-10 NOTE — H&P (Signed)
History and Physical    Martin [name]:[id number] DOB: 01-31-1961 DOA: 03/10/2020  PCP: Patient, No Pcp Per (Confirm with patient/family/NH records and if not entered, this has to be entered at [facility] point of entry) Patient coming from: Home  I have personally briefly reviewed patient's old medical records in [place]  Chief Complaint: Abd pain  HPI: Martin [name] is a 59 y.o. male with medical history significant of  rectal cancer s/p resection, hypertension,  GERD presents in the emergency department with abdominal pain.  Patient went to [facility] 2 days ago, was diagnosed with small bowel obstruction.  Yesterday patient left AMA for family issue.    Overnight, patient continues to feel nauseous and had several biliary vomitus and episodes of cramping like abdominal pain.  This morning he passed small amount hard stool, but abdominal pain persisted.  Over last month, he reports having intermittent abdominal pain and intermittent constipation.   ED Course: X-ray showed persistent small bowel obstruction, with some p.o. contrast in colon indicating partial resolved bowel SBO, and the patient had p.o. contrast yesterday before signed out AMA.  Review of Systems: As per HPI otherwise 10 point review of systems negative.    Past Medical History:  Diagnosis Date  . Allergy 2 2013  . 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 Anxiety   . Arthritis    DDD lumbar, arthritis knees  . DJD (degenerative joint disease) of lumbar spine   . GERD (gastroesophageal reflux disease)   . Hemorrhoid    internal  . History of bowel resection   . Hypertension    EKG 12/12 EPIC   no  PCP- states increased lately but hasnt been diagnosed formally  . Inguinal hernia   . Pneumonia    as child, cough at present with no fever  . Pneumonia    as child, cough at present with no fever   . Radiation 11/19/11-01/08/12   5040 cGy 28 fx Pelvis and inguinal area      Past Surgical History:  Procedure Laterality Date  . APPENDECTOMY     age 63 or 12  . EXAMINATION UNDER ANESTHESIA  10/14/2011   Procedure: EXAM UNDER ANESTHESIA;  Surgeon: Earnstine Regal, MD;  Location: [facility];  Service: General;  Laterality: N/A;  exam under anethesia, Excision of mass anal canal, 1.5cm  . INGUINAL HERNIA REPAIR  05/11/2012   Procedure: HERNIA REPAIR INGUINAL ADULT;  Surgeon: Earnstine Regal, MD;  Location: [place];  Service: General;  Laterality: Left;  left inguinal hernia repair with mesh  . surgical pathology   10/14/2011   squamous cell ca of anus  . transanal excision  10/14/2011   Dr.Todd Gerkin     reports that he has been smoking cigarettes. He has a 58.75 pack-year smoking history. He has never used smokeless tobacco. He reports current alcohol use of about 6.0 standard drinks of alcohol per week. He reports current drug use. Drugs: Marijuana and Cocaine.  Allergies  Allergen Reactions  . Yellow Jacket Venom [Bee Venom] Anaphylaxis  . Aspirin Swelling  . Codeine Nausea Only  . Morphine And Related Itching    Family History  Problem Relation Age of Onset  . Colon cancer Father   . Asthma Father   . Cancer Father        prostate  . Hypertension Father   . Asthma Mother   .  Hypertension Mother   . Stomach cancer Neg Hx   . Esophageal cancer Neg Hx      Prior to Admission medications   Medication Sig Start Date End Date Taking? Authorizing Provider  oxyCODONE-acetaminophen (PERCOCET/ROXICET) 5-325 MG tablet Take 1 tablet by mouth every 4 (four) hours as needed for severe pain. 02/16/20  Yes Gery Pray, MD  tamsulosin (FLOMAX) 0.4 MG CAPS capsule Take 1 capsule (0.4 mg total) by mouth at bedtime. 01/18/20  Yes Gery Pray, MD  amoxicillin-clavulanate (AUGMENTIN) 875-125 MG tablet Take 1 tablet by mouth 2 (two) times daily. One po bid x 7 days Patient not taking: Reported on 11/28/2019 10/21/19   Palumbo, April, MD  EPINEPHrine (EPIPEN  2-PAK) 0.3 mg/0.3 mL IJ SOAJ injection Inject 0.3 mLs (0.3 mg total) into the muscle as needed for anaphylaxis. 02/23/20   Long, Wonda Olds, MD  omeprazole (PRILOSEC) 20 MG capsule Take 1 capsule (20 mg total) by mouth daily. Patient not taking: Reported on 11/28/2019 10/21/19   Palumbo, April, MD  sucralfate (CARAFATE) 1 GM/10ML suspension Take 10 mLs (1 g total) by mouth 4 (four) times daily -  with meals and at bedtime. Patient not taking: Reported on 11/28/2019 10/21/19   Randal Buba, April, MD    Physical Exam: Vitals:   03/10/20 1103 03/10/20 1104  BP: (!) 185/80   Pulse: 83   Resp: (!) 26   Temp: 98.1 F (36.7 C)   TempSrc: Oral   SpO2: 98%   Weight:  65.8 kg  Height:  5' 10"  (1.778 m)    Constitutional: NAD, calm, comfortable Vitals:   03/10/20 1103 03/10/20 1104  BP: (!) 185/80   Pulse: 83   Resp: (!) 26   Temp: 98.1 F (36.7 C)   TempSrc: Oral   SpO2: 98%   Weight:  65.8 kg  Height:  5' 10"  (1.778 m)   Eyes: PERRL, lids and conjunctivae normal ENMT: Mucous membranes are moist. Posterior pharynx clear of any exudate or lesions.Normal dentition.  Neck: normal, supple, no masses, no thyromegaly Respiratory: clear to auscultation bilaterally, no wheezing, no crackles. Normal respiratory effort. No accessory muscle use.  Cardiovascular: Regular rate and rhythm, no murmurs / rubs / gallops. No extremity edema. 2+ pedal pulses. No carotid bruits.  Abdomen: tenderness on periumbilical area, no rebound no guarding, no masses palpated. No hepatosplenomegaly. Bowel sounds positive on all 4 quadrants.  Musculoskeletal: no clubbing / cyanosis. No joint deformity upper and lower extremities. Good ROM, no contractures. Normal muscle tone.  Skin: no rashes, lesions, ulcers. No induration Neurologic: CN 2-12 grossly intact. Sensation intact, DTR normal. Strength 5/5 in all 4.  Psychiatric: Normal judgment and insight. Alert and oriented x 3. Normal mood.     Labs on Admission: I have  personally reviewed following labs and imaging studies  CBC: Recent Labs  Lab 03/08/20 0752 03/08/20 0811 03/09/20 0317 03/10/20 1219  WBC 15.7*  --  6.4 9.7  NEUTROABS 13.8*  --   --  7.4  HGB 15.6 13.3 14.9 16.1  HCT 44.5 39.0 42.3 46.5  MCV 89.0  --  86.7 89.4  PLT 193  --  180 016   Basic Metabolic Panel: Recent Labs  Lab 03/08/20 0752 03/08/20 0811 03/09/20 0317 03/10/20 1219  NA 133* 138 133* 136  K 4.4 3.6 3.7 3.5  CL 98 104 99 102  CO2 24  --  23 24  GLUCOSE 106* 100* 110* 114*  BUN 12 11 10 19   CREATININE 0.74  0.40* 0.80 0.83  CALCIUM 9.5  --  8.5* 9.0   GFR: Estimated Creatinine Clearance: 87 mL/min (by C-G formula based on SCr of 0.83 mg/dL). Liver Function Tests: Recent Labs  Lab 03/08/20 0752 03/09/20 0317 03/10/20 1219  AST 129* 73* 58*  ALT 232* 166* 154*  ALKPHOS 129* 104 102  BILITOT 1.5* 1.3* 1.1  PROT 8.0 6.8 6.5  ALBUMIN 4.1 3.5 3.4*   Recent Labs  Lab 03/08/20 0752 03/10/20 1219  LIPASE 21 23   No results for input(s): AMMONIA in the last 168 hours. Coagulation Profile: No results for input(s): INR, PROTIME in the last 168 hours. Cardiac Enzymes: No results for input(s): CKTOTAL, CKMB, CKMBINDEX, TROPONINI in the last 168 hours. BNP (last 3 results) No results for input(s): PROBNP in the last 8760 hours. HbA1C: No results for input(s): HGBA1C in the last 72 hours. CBG: Recent Labs  Lab 03/09/20 0741  GLUCAP 89   Lipid Profile: No results for input(s): CHOL, HDL, LDLCALC, TRIG, CHOLHDL, LDLDIRECT in the last 72 hours. Thyroid Function Tests: No results for input(s): TSH, T4TOTAL, FREET4, T3FREE, THYROIDAB in the last 72 hours. Anemia Panel: No results for input(s): VITAMINB12, FOLATE, FERRITIN, TIBC, IRON, RETICCTPCT in the last 72 hours. Urine analysis:    Component Value Date/Time   COLORURINE YELLOW 03/08/2020 [place] 03/08/2020 0931   LABSPEC 1.024 03/08/2020 0931   LABSPEC 1.015 03/02/2012 1620     PHURINE 7.0 03/08/2020 0931   GLUCOSEU NEGATIVE 03/08/2020 0931   GLUCOSEU NEGATIVE 03/19/2012 1426   HGBUR NEGATIVE 03/08/2020 0931   BILIRUBINUR NEGATIVE 03/08/2020 0931   BILIRUBINUR NEG 08/12/2012 1631   BILIRUBINUR Negative 03/02/2012 1620   KETONESUR 20 (A) 03/08/2020 0931   PROTEINUR NEGATIVE 03/08/2020 0931   UROBILINOGEN 0.2 07/07/2015 2056   NITRITE NEGATIVE 03/08/2020 0931   LEUKOCYTESUR NEGATIVE 03/08/2020 0931   LEUKOCYTESUR Negative 03/02/2012 1620    Radiological Exams on Admission: DG ABD ACUTE 2+V W 1V CHEST  Result Date: 03/10/2020 CLINICAL DATA:  Vomiting. Abdominal pain. Small-bowel obstruction recently. EXAM: DG ABDOMEN ACUTE W/ 1V CHEST COMPARISON:  03/09/2020 FINDINGS: There is persistent significant dilatation of small bowel loops which measure up to 4.5 centimeters. Contrast is identified within loops of colon, new since the previous exam. There is no evidence for free intraperitoneal air. Heart size is normal. Lungs are clear. Mild interstitial prominence. IMPRESSION: 1. Persistent significant dilatation of small bowel loops. 2. Contrast in nondilated loops of colon. The patient had a CT of the chest abdomen and pelvis on 03/08/2020 but no oral contrast was given. I suspect that the patient has had oral contrast for an outside study within the last 24 hours. If the patient had oral contrast for this study, the findings would exclude complete obstruction. If the patient had rectal contrast, the findings are consistent with persistent high-grade small bowel obstruction. 3. Recommend correlation with recent outside studies. Electronically Signed   By: Nolon Nations M.D.   On: 03/10/2020 12:20   DG Abd Portable 1V  Result Date: 03/09/2020 CLINICAL DATA:  Abdominal distension. EXAM: PORTABLE ABDOMEN - 1 VIEW COMPARISON:  CT scan 10/21/2019 FINDINGS: The visualized lung bases are grossly clear. Dilated small bowel loops are noted. No obvious free air on this portable  supine film. There is some air in stool in the right colon but otherwise the colon is relatively decompressed. The bony structures are unremarkable. IMPRESSION: Small-bowel obstruction bowel gas pattern.  No obvious free air. Electronically Signed  By: Marijo Sanes M.D.   On: 03/09/2020 06:16    EKG: Independently reviewed.  Normal sinus rhythm, no acute ST changes  Assessment/Plan Active Problems:   SBO (small bowel obstruction) (HCC)  (please populate well all problems here in Problem List. (For example, if patient is on BP meds at home and you resume or decide to hold them, it is a problem that needs to be her. Same for CAD, COPD, HLD and so on)  Partial SBO, recurrent -P.o. contrast in colon indicating partial resolving of SBO.  Continue n.p.o., start IV fluid and as needed pain meds.  Surgeon on board -Patient feels very symptomatic, will not start diet today -No indication for NGT since symptoms are overall stable and improving  Hypertension -As needed labetalol, resume p.o. meds once establish p.o.  Question of hepatitis C -Hepatitis C antibody was -4 months ago but became positive on last admission 2 days ago.  Recommend outpatient GI follow-up and repeat hepatitis C/PCR confirmation.    DVT prophylaxis: SCD and ambulates Code Status: Full Code Family Communication: None at bedside Disposition Plan: Likely can start diet in next 24 hours and may need 24 to 48 hours to stabilize symptoms and then discharge  consults called: General surgeon Admission status: MedSurg   Lequita Halt MD [facility] Pager [phone]  Password [email or web address]  03/10/2020, 1:37 PM

## 2020-03-10 NOTE — Progress Notes (Signed)
Pt arrived to room 6N01 via stretcher from the ED. Received report from [place], Therapist, sports. See assessment. Will continue to monitor.

## 2020-03-10 NOTE — ED Triage Notes (Signed)
EMS stated, He has had abdminal pain with N/V for the pass 4-5 days.

## 2020-03-10 NOTE — ED Triage Notes (Signed)
20 g left AC, Zofran 35m, Fentanly 20104m 1051

## 2020-03-14 ENCOUNTER — Telehealth: Payer: Self-pay

## 2020-03-14 NOTE — Telephone Encounter (Signed)
Returned patient's call that refill request is waiting on Dr. Clabe Seal desk to be refilled.

## 2020-03-15 ENCOUNTER — Other Ambulatory Visit: Payer: Self-pay | Admitting: Radiation Oncology

## 2020-03-15 MED ORDER — OXYCODONE-ACETAMINOPHEN 5-325 MG PO TABS: 1.0000 | ORAL_TABLET | ORAL | 0 refills | Status: DC | PRN

## 2020-03-15 NOTE — Discharge Summary (Signed)
Physician Discharge Summary  Martin [name]:[id number] DOB: August 25, 1961 DOA: 03/10/2020  PCP: Patient, No Pcp Per  Admit date: 03/10/2020 Discharge date: 03/15/2020  Admitted From: Home Disposition:  Home Recommendations for Outpatient Follow-up:  1. Follow up with PCP in 1-2 weeks 2. Please obtain BMP/CBC in one week 3. Please follow up on the following pending results:  Home Health: No Equipment/Devices: None  Discharge Condition: Stable CODE STATUS: Full Code Diet recommendation: Regular diet  Brief/Interim Summary: 59 y.o. male with medical history significant of rectal cancer s/p resection, hypertension, GERD presents in the emergency department with recurrent SBO.  Patient went to [facility] 2 days before for similar presentation and left AMA then.   Overnight, patient continues to feel nauseous and had several biliary vomitus and episodes of cramping like abdominal pain.  Repeat image done in the ED showed pt might have improved SBO with PO contrast (ingested on the day pt signed AMA in [place] one day prior). But since pt still very symptomatic and unable to eat or drink, plan was for the patient to stay overnight with NPO initially and then gradually introduced diet back. Pt agreed in the ED to stay but left AMA when arrived on floor. Discharge Diagnoses:  Active Problems:   Partial small bowel obstruction [facility])    Discharge Instructions  If you develop severe abd pain or unable to eat or drink >24 hours, call EMS or come back to ED for evaluation.  Allergies as of 03/10/2020      Reactions   Yellow Jacket Venom [bee Venom] Anaphylaxis   Aspirin Swelling   Codeine Nausea Only   Morphine And Related Itching      Medication List    ASK your doctor about these medications   amoxicillin-clavulanate 875-125 MG tablet Commonly known as: Augmentin Take 1 tablet by mouth 2 (two) times daily. One po bid x 7 days   EPINEPHrine 0.3 mg/0.3 mL Soaj  injection Commonly known as: EpiPen 2-Pak Inject 0.3 mLs (0.3 mg total) into the muscle as needed for anaphylaxis.   omeprazole 20 MG capsule Commonly known as: PRILOSEC Take 1 capsule (20 mg total) by mouth daily.   sucralfate 1 GM/10ML suspension Commonly known as: Carafate Take 10 mLs (1 g total) by mouth 4 (four) times daily -  with meals and at bedtime.   tamsulosin 0.4 MG Caps capsule Commonly known as: FLOMAX Take 1 capsule (0.4 mg total) by mouth at bedtime.       Allergies  Allergen Reactions  . Yellow Jacket Venom [Bee Venom] Anaphylaxis  . Aspirin Swelling  . Codeine Nausea Only  . Morphine And Related Itching    Consultations:  None   Procedures/Studies: DG ABD ACUTE 2+V W 1V CHEST  Result Date: 03/10/2020 CLINICAL DATA:  Vomiting. Abdominal pain. Small-bowel obstruction recently. EXAM: DG ABDOMEN ACUTE W/ 1V CHEST COMPARISON:  03/09/2020 FINDINGS: There is persistent significant dilatation of small bowel loops which measure up to 4.5 centimeters. Contrast is identified within loops of colon, new since the previous exam. There is no evidence for free intraperitoneal air. Heart size is normal. Lungs are clear. Mild interstitial prominence. IMPRESSION: 1. Persistent significant dilatation of small bowel loops. 2. Contrast in nondilated loops of colon. The patient had a CT of the chest abdomen and pelvis on 03/08/2020 but no oral contrast was given. I suspect that the patient has had oral contrast for an outside study within the last 24 hours. If the patient had  oral contrast for this study, the findings would exclude complete obstruction. If the patient had rectal contrast, the findings are consistent with persistent high-grade small bowel obstruction. 3. Recommend correlation with recent outside studies. Electronically Signed   By: Nolon Nations M.D.   On: 03/10/2020 12:20   DG Abd Portable 1V  Result Date: 03/09/2020 CLINICAL DATA:  Abdominal distension. EXAM:  PORTABLE ABDOMEN - 1 VIEW COMPARISON:  CT scan 10/21/2019 FINDINGS: The visualized lung bases are grossly clear. Dilated small bowel loops are noted. No obvious free air on this portable supine film. There is some air in stool in the right colon but otherwise the colon is relatively decompressed. The bony structures are unremarkable. IMPRESSION: Small-bowel obstruction bowel gas pattern.  No obvious free air. Electronically Signed   By: Marijo Sanes M.D.   On: 03/09/2020 06:16   CT Angio Chest/Abd/Pel for Dissection W and/or Wo Contrast  Result Date: 03/08/2020 CLINICAL DATA:  Abdominal pain. EXAM: CT ANGIOGRAPHY CHEST, ABDOMEN AND PELVIS TECHNIQUE: Non-contrast CT of the chest was initially obtained. Multidetector CT imaging through the chest, abdomen and pelvis was performed using the standard protocol during bolus administration of intravenous contrast. Multiplanar reconstructed images and MIPs were obtained and reviewed to evaluate the vascular anatomy. CONTRAST:  131m OMNIPAQUE IOHEXOL 350 MG/ML SOLN COMPARISON:  October 21, 2019. FINDINGS: CTA CHEST FINDINGS Cardiovascular: Preferential opacification of the thoracic aorta. No evidence of thoracic aortic aneurysm or dissection. Normal heart size. No pericardial effusion. Mediastinum/Nodes: No enlarged mediastinal, hilar, or axillary lymph nodes. Thyroid gland, trachea, and esophagus demonstrate no significant findings. Lungs/Pleura: No pneumothorax or pleural effusion is noted. Mild biapical scarring is noted. Mild emphysematous bulla is noted in both lung apices is well. No acute abnormality is noted. Musculoskeletal: No chest wall abnormality. No acute or significant osseous findings. Review of the MIP images confirms the above findings. CTA ABDOMEN AND PELVIS FINDINGS VASCULAR Aorta: Normal caliber aorta without aneurysm, dissection, vasculitis or significant stenosis. Celiac: Patent without evidence of aneurysm, dissection, vasculitis or significant  stenosis. SMA: Patent without evidence of aneurysm, dissection, vasculitis or significant stenosis. Renals: Not well visualized due to patient motion artifact. IMA: Patent without evidence of aneurysm, dissection, vasculitis or significant stenosis. Inflow: Left common and external iliac arteries are widely patent. Right common iliac artery is widely patent. 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 Moderate focal stenosis is noted involving the proximal portion of the right external iliac artery. Veins: No obvious venous abnormality within the limitations of this arterial phase study. Review of the MIP images confirms the above findings. NON-VASCULAR Hepatobiliary: No focal liver abnormality is seen. No gallstones, gallbladder wall thickening, or biliary dilatation. Pancreas: Unremarkable. No pancreatic ductal dilatation or surrounding inflammatory changes. Spleen: Normal in size without focal abnormality. Adrenals/Urinary Tract: Adrenal glands are unremarkable. Kidneys are normal, without renal calculi, focal lesion, or hydronephrosis. Bladder is unremarkable. Stomach/Bowel: Status post appendectomy. The stomach is unremarkable. Mildly dilated small bowel loops are noted in the upper and left lower quadrant of the abdomen with possible transition zone seen in the ileum. Slight enhancement of the wall of the terminal ileum is noted, although no surrounding inflammatory changes are noted. No colonic dilatation is noted. Lymphatic: No significant adenopathy is noted. Reproductive: Prostate is unremarkable. Other: No abdominal wall hernia or abnormality. No abdominopelvic ascites. Musculoskeletal: No acute or significant osseous findings. Review of the MIP images confirms the above findings. IMPRESSION: 1. There is no evidence of thoracic or abdominal aortic dissection  or aneurysm. 2. 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 3. Moderate focal stenosis is noted involving the proximal portion  of the right external iliac artery. 4. Mildly dilated small bowel loops are noted in the upper and left lower quadrant of the abdomen with possible transition zone seen in the ileum. Slight enhancement of the wall of the terminal ileum is noted, although no surrounding inflammatory changes are noted. This may represent some degree of enteritis, but early or partial small bowel obstruction cannot be excluded. Emphysema (ICD10-J43.9). Electronically Signed   By: Marijo Conception M.D.   On: 03/08/2020 09:37   ECHOCARDIOGRAM LIMITED  Result Date: 03/08/2020    ECHOCARDIOGRAM REPORT   Patient Name:   Martin [name] [facility] Date of Exam: 03/08/2020 Medical Rec #:  [id number]       Height:       70.0 in Accession #:    [id number]      Weight:       145.0 lb Date of Birth:  25-Mar-1961       BSA:          1.821 m Patient Age:    59 years        BP:           141/96 mmHg Patient Gender: M               HR:           90 bpm. Exam Location:  Inpatient Procedure: Limited Echo Indications:    Preoperative evaluation  History:        Patient has no prior history of Echocardiogram examinations.                 PAD, Aortic Valve Disease; Risk Factors:Hypertension and Current                 Smoker. Abdominal pain, Small bowel obstruction due to                 adhesions, polysubstance abuse.  Sonographer:    Darlina Sicilian RDCS Referring Phys: [id number] [facility] [place]  Sonographer Comments: Image acquisition challenging due to uncooperative patient. Study was 6 clips. Limited echo performed. IMPRESSIONS  1. Left ventricular ejection fraction, by estimation, is 60 to 65%. The left ventricle has normal function.  2. The mitral valve is grossly normal. No evidence of mitral valve regurgitation. FINDINGS  Left Ventricle: Left ventricular ejection fraction, by estimation, is 60 to 65%. The left ventricle has normal function. Mitral Valve: The mitral valve is grossly normal. No evidence of mitral valve regurgitation. Cherlynn Kaiser MD Electronically  signed by Cherlynn Kaiser MD Signature Date/Time: 03/08/2020/5:26:12 PM    Final     (Echo, Carotid, EGD, Colonoscopy, ERCP)    Subjective:   Discharge Exam: Vitals:   03/10/20 1518 03/10/20 1744  BP: (!) 167/77 (!) 192/82  Pulse: 66 72  Resp: 20 20  Temp: 98.1 F (36.7 C) 98.1 F (36.7 C)  SpO2: 100% 99%   Vitals:   03/10/20 1359 03/10/20 1503 03/10/20 1518 03/10/20 1744  BP: (!) 170/77 (!) 169/68 (!) 167/77 (!) 192/82  Pulse: 76 68 66 72  Resp: (!) 27 19 20 20   Temp:  98.5 F (36.9 C) 98.1 F (36.7 C) 98.1 F (36.7 C)  TempSrc:  Oral Oral Oral  SpO2: 98% 97% 100% 99%  Weight:      Height:        General: Pt is alert, awake,  not in acute distress Cardiovascular: RRR, S1/S2 +, no rubs, no gallops Respiratory: CTA bilaterally, no wheezing, no rhonchi Abdominal: Soft, NT, ND, bowel sounds + Extremities: no edema, no cyanosis    The results of significant diagnostics from this hospitalization (including imaging, microbiology, ancillary and laboratory) are listed below for reference.     Microbiology: Recent Results (from the past 240 hour(s))  SARS Coronavirus 2 by RT PCR (hospital order, performed in [facility] hospital lab) Nasopharyngeal Nasopharyngeal Swab     Status: None   Collection Time: 03/08/20 12:21 PM   Specimen: Nasopharyngeal Swab  Result Value Ref Range Status   SARS Coronavirus 2 NEGATIVE NEGATIVE Final    Comment: (NOTE) SARS-CoV-2 target nucleic acids are NOT DETECTED.  The SARS-CoV-2 RNA is generally detectable in upper and lower respiratory specimens during the acute phase of infection. The lowest concentration of SARS-CoV-2 viral copies this assay can detect is 250 copies / mL. A negative result does not preclude SARS-CoV-2 infection and should not be used as the sole basis for treatment or other patient management decisions.  A negative result may occur with improper specimen collection / handling, submission of specimen other than  nasopharyngeal swab, presence of viral mutation(s) within the areas targeted by this assay, and inadequate number of viral copies (<250 copies / mL). A negative result must be combined with clinical observations, patient history, and epidemiological information.  Fact Sheet for Patients:   StrictlyIdeas.no  Fact Sheet for Healthcare Providers: BankingDealers.co.za  This test is not yet approved or  cleared by the [place] FDA and has been authorized for detection and/or diagnosis of SARS-CoV-2 by FDA under an Emergency Use Authorization (EUA).  This EUA will remain in effect (meaning this test can be used) for the duration of the COVID-19 declaration under Section 564(b)(1) of the Act, 21 U.S.C. section 360bbb-3(b)(1), unless the authorization is terminated or revoked sooner.  Performed at [facility], [place] [address] [place], [place] [address]   SARS Coronavirus 2 by RT PCR (hospital order, performed in [facility] hospital lab) Nasopharyngeal Nasopharyngeal Swab     Status: None   Collection Time: 03/10/20  1:14 PM   Specimen: Nasopharyngeal Swab  Result Value Ref Range Status   SARS Coronavirus 2 NEGATIVE NEGATIVE Final    Comment: (NOTE) SARS-CoV-2 target nucleic acids are NOT DETECTED.  The SARS-CoV-2 RNA is generally detectable in upper and lower respiratory specimens during the acute phase of infection. The lowest concentration of SARS-CoV-2 viral copies this assay can detect is 250 copies / mL. A negative result does not preclude SARS-CoV-2 infection and should not be used as the sole basis for treatment or other patient management decisions.  A negative result may occur with improper specimen collection / handling, submission of specimen other than nasopharyngeal swab, presence of viral mutation(s) within the areas targeted by this assay, and inadequate number of viral copies (<250 copies / mL). A  negative result must be combined with clinical observations, patient history, and epidemiological information.  Fact Sheet for Patients:   StrictlyIdeas.no  Fact Sheet for Healthcare Providers: BankingDealers.co.za  This test is not yet approved or  cleared by the [place] FDA and has been authorized for detection and/or diagnosis of SARS-CoV-2 by FDA under an Emergency Use Authorization (EUA).  This EUA will remain in effect (meaning this test can be used) for the duration of the COVID-19 declaration under Section 564(b)(1) of the Act, 21 U.S.C. section 360bbb-3(b)(1), unless the authorization  is terminated or revoked sooner.  Performed at [facility], [place] [address] [place], [place] [address]      Labs: BNP (last 3 results) No results for input(s): BNP in the last 8760 hours. Basic Metabolic Panel: Recent Labs  Lab 03/09/20 0317 03/10/20 1219  NA 133* 136  K 3.7 3.5  CL 99 102  CO2 23 24  GLUCOSE 110* 114*  BUN 10 19  CREATININE 0.80 0.83  CALCIUM 8.5* 9.0   Liver Function Tests: Recent Labs  Lab 03/09/20 0317 03/10/20 1219  AST 73* 58*  ALT 166* 154*  ALKPHOS 104 102  BILITOT 1.3* 1.1  PROT 6.8 6.5  ALBUMIN 3.5 3.4*   Recent Labs  Lab 03/10/20 1219  LIPASE 23   No results for input(s): AMMONIA in the last 168 hours. CBC: Recent Labs  Lab 03/09/20 0317 03/10/20 1219  WBC 6.4 9.7  NEUTROABS  --  7.4  HGB 14.9 16.1  HCT 42.3 46.5  MCV 86.7 89.4  PLT 180 215   Cardiac Enzymes: No results for input(s): CKTOTAL, CKMB, CKMBINDEX, TROPONINI in the last 168 hours. BNP: Invalid input(s): POCBNP CBG: Recent Labs  Lab 03/09/20 0741  GLUCAP 89   D-Dimer No results for input(s): DDIMER in the last 72 hours. Hgb A1c No results for input(s): HGBA1C in the last 72 hours. Lipid Profile No results for input(s): CHOL, HDL, LDLCALC, TRIG, CHOLHDL, LDLDIRECT in the last 72 hours. Thyroid  function studies No results for input(s): TSH, T4TOTAL, T3FREE, THYROIDAB in the last 72 hours.  Invalid input(s): FREET3 Anemia work up No results for input(s): VITAMINB12, FOLATE, FERRITIN, TIBC, IRON, RETICCTPCT in the last 72 hours. Urinalysis    Component Value Date/Time   COLORURINE YELLOW 03/08/2020 [place] 03/08/2020 0931   LABSPEC 1.024 03/08/2020 0931   LABSPEC 1.015 03/02/2012 1620   PHURINE 7.0 03/08/2020 0931   GLUCOSEU NEGATIVE 03/08/2020 0931   GLUCOSEU NEGATIVE 03/19/2012 1426   HGBUR NEGATIVE 03/08/2020 0931   BILIRUBINUR NEGATIVE 03/08/2020 0931   BILIRUBINUR NEG 08/12/2012 1631   BILIRUBINUR Negative 03/02/2012 1620   KETONESUR 20 (A) 03/08/2020 0931   PROTEINUR NEGATIVE 03/08/2020 0931   UROBILINOGEN 0.2 07/07/2015 2056   NITRITE NEGATIVE 03/08/2020 0931   LEUKOCYTESUR NEGATIVE 03/08/2020 0931   LEUKOCYTESUR Negative 03/02/2012 1620   Sepsis Labs Invalid input(s): PROCALCITONIN,  WBC,  LACTICIDVEN Microbiology Recent Results (from the past 240 hour(s))  SARS Coronavirus 2 by RT PCR (hospital order, performed in [place] hospital lab) Nasopharyngeal Nasopharyngeal Swab     Status: None   Collection Time: 03/08/20 12:21 PM   Specimen: Nasopharyngeal Swab  Result Value Ref Range Status   SARS Coronavirus 2 NEGATIVE NEGATIVE Final    Comment: (NOTE) SARS-CoV-2 target nucleic acids are NOT DETECTED.  The SARS-CoV-2 RNA is generally detectable in upper and lower respiratory specimens during the acute phase of infection. The lowest concentration of SARS-CoV-2 viral copies this assay can detect is 250 copies / mL. A negative result does not preclude SARS-CoV-2 infection and should not be used as the sole basis for treatment or other patient management decisions.  A negative result may occur with improper specimen collection / handling, submission of specimen other than nasopharyngeal swab, presence of viral mutation(s) within the areas  targeted by this assay, and inadequate number of viral copies (<250 copies / mL). A negative result must be combined with clinical observations, patient history, and epidemiological information.  Fact Sheet for Patients:  StrictlyIdeas.no  Fact Sheet for Healthcare Providers: BankingDealers.co.za  This test is not yet approved or  cleared by the [place] FDA and has been authorized for detection and/or diagnosis of SARS-CoV-2 by FDA under an Emergency Use Authorization (EUA).  This EUA will remain in effect (meaning this test can be used) for the duration of the COVID-19 declaration under Section 564(b)(1) of the Act, 21 U.S.C. section 360bbb-3(b)(1), unless the authorization is terminated or revoked sooner.  Performed at [facility], [place] [address] [place], [place] [address]   SARS Coronavirus 2 by RT PCR (hospital order, performed in [facility] hospital lab) Nasopharyngeal Nasopharyngeal Swab     Status: None   Collection Time: 03/10/20  1:14 PM   Specimen: Nasopharyngeal Swab  Result Value Ref Range Status   SARS Coronavirus 2 NEGATIVE NEGATIVE Final    Comment: (NOTE) SARS-CoV-2 target nucleic acids are NOT DETECTED.  The SARS-CoV-2 RNA is generally detectable in upper and lower respiratory specimens during the acute phase of infection. The lowest concentration of SARS-CoV-2 viral copies this assay can detect is 250 copies / mL. A negative result does not preclude SARS-CoV-2 infection and should not be used as the sole basis for treatment or other patient management decisions.  A negative result may occur with improper specimen collection / handling, submission of specimen other than nasopharyngeal swab, presence of viral mutation(s) within the areas targeted by this assay, and inadequate number of viral copies (<250 copies / mL). A negative result must be combined with clinical observations, patient  history, and epidemiological information.  Fact Sheet for Patients:   StrictlyIdeas.no  Fact Sheet for Healthcare Providers: BankingDealers.co.za  This test is not yet approved or  cleared by the [place] FDA and has been authorized for detection and/or diagnosis of SARS-CoV-2 by FDA under an Emergency Use Authorization (EUA).  This EUA will remain in effect (meaning this test can be used) for the duration of the COVID-19 declaration under Section 564(b)(1) of the Act, 21 U.S.C. section 360bbb-3(b)(1), unless the authorization is terminated or revoked sooner.  Performed at [facility], [place] [address] [place], [place] [address]      Time coordinating discharge: Over 30 minutes  SIGNED:   Lequita Halt, MD  [facility] 03/15/2020, 12:42 PM Pager   If 7PM-7AM, please contact night-coverage [email or web address] Password [email or web address]

## 2020-03-16 ENCOUNTER — Telehealth: Payer: Self-pay

## 2020-03-16 NOTE — Telephone Encounter (Signed)
Patient called stating he needs a Prior auth for his Oxycodone rx. Patient advised this will be requested and he will be notified once the PA  Is complete. 950 am PA Requested per phone call from [facility].

## 2020-03-22 ENCOUNTER — Other Ambulatory Visit: Payer: Self-pay | Admitting: Radiation Oncology

## 2020-03-22 ENCOUNTER — Telehealth: Payer: Self-pay

## 2020-03-22 MED ORDER — TAMSULOSIN HCL 0.4 MG PO CAPS: 0.4000 mg | ORAL_CAPSULE | Freq: Every day | ORAL | 2 refills | Status: DC

## 2020-03-22 NOTE — Telephone Encounter (Signed)
RN returned patient's call and advised his prior authorization for his pain medicine went through and he can pick up the balance of his medicine at his drug store.

## 2020-04-06 ENCOUNTER — Inpatient Hospital Stay: Payer: Medicaid Other | Admitting: Internal Medicine

## 2020-04-12 ENCOUNTER — Other Ambulatory Visit: Payer: Self-pay | Admitting: Urology

## 2020-04-12 ENCOUNTER — Telehealth: Payer: Self-pay

## 2020-04-12 MED ORDER — OXYCODONE-ACETAMINOPHEN 5-325 MG PO TABS: 1.0000 | ORAL_TABLET | ORAL | 0 refills | Status: DC | PRN

## 2020-04-12 NOTE — Telephone Encounter (Signed)
Received refill request for patient's percocet. With both Dr. Sondra Come and Dr. Isidore Moos out of the office today, Ashlyn Bruning-PA agreed to refill prescription for patient. Called patient and left a VM to let him know prescription was sent to [facility] on [address]. Provided my direct call back number should patient require anything else before Dr. Clabe Seal return next week.

## 2020-05-10 ENCOUNTER — Other Ambulatory Visit: Payer: Self-pay | Admitting: Radiation Oncology

## 2020-05-10 MED ORDER — OXYCODONE-ACETAMINOPHEN 5-325 MG PO TABS: 1.0000 | ORAL_TABLET | ORAL | 0 refills | Status: DC | PRN

## 2020-06-07 ENCOUNTER — Other Ambulatory Visit: Payer: Self-pay | Admitting: Radiation Oncology

## 2020-06-07 MED ORDER — OXYCODONE-ACETAMINOPHEN 5-325 MG PO TABS: 1.0000 | ORAL_TABLET | ORAL | 0 refills | Status: DC | PRN

## 2020-06-13 ENCOUNTER — Other Ambulatory Visit: Payer: Self-pay

## 2020-06-13 ENCOUNTER — Encounter: Payer: Self-pay | Admitting: Emergency Medicine

## 2020-06-13 ENCOUNTER — Ambulatory Visit
Admission: EM | Admit: 2020-06-13 | Discharge: 2020-06-13 | Disposition: A | Discharge status: Home / Self Care | Payer: Medicaid Other | Attending: Emergency Medicine | Admitting: Emergency Medicine

## 2020-06-13 DIAGNOSIS — M5441 Lumbago with sciatica, right side: Secondary | ICD-10-CM | POA: Diagnosis not present

## 2020-06-13 MED ORDER — KETOROLAC TROMETHAMINE 30 MG/ML IJ SOLN
30.0000 mg | Freq: Once | INTRAMUSCULAR | Status: AC
  Administered 2020-06-13: 30 mg via INTRAMUSCULAR

## 2020-06-13 MED ORDER — PREDNISONE 10 MG PO TABS
ORAL_TABLET | ORAL | 0 refills | Status: DC
Start: 2020-06-13 — End: 2020-06-14

## 2020-06-13 MED ORDER — TIZANIDINE HCL 4 MG PO TABS
4.0000 mg | ORAL_TABLET | Freq: Four times a day (QID) | ORAL | 0 refills | Status: DC | PRN
Start: 2020-06-13 — End: 2020-08-07

## 2020-06-13 NOTE — ED Triage Notes (Signed)
Pt here for right hip pain after staining trying to get a jack to work properly 3 days ago

## 2020-06-13 NOTE — Discharge Instructions (Signed)
We gave you an injection of Toradol Begin prednisone taper beginning tomorrow morning-begin with 6 tablets, decrease by 1 tablet each day until complete-6, 5, 4, 3, 2, 1-take with food in the morning Supplement with tizanidine which is a muscle relaxer as needed, please limit use to at home or bedtime May use oxycodone which you previously have prescribed to you for severe pain Avoid bedrest, avoid heavy lifting Gentle stretching, alternate ice and heat Follow-up if not improving or worsening

## 2020-06-14 ENCOUNTER — Telehealth: Payer: Self-pay | Admitting: Emergency Medicine

## 2020-06-14 MED ORDER — PREDNISONE 10 MG PO TABS: ORAL_TABLET | ORAL | 0 refills | Status: DC

## 2020-06-14 NOTE — ED Provider Notes (Signed)
[name]    CSN: [id number] Arrival date & time: 06/13/20  1903      History   Chief Complaint Chief Complaint  Patient presents with   Hip Pain    HPI Martin [name] is a 59 y.o. male presenting today for evaluation of right hip pain.  Patient reports that 3 days ago he was doing heavy lifting with a jack and elevating a platform that it fell onto coworkers arm.  After this he developed increased pain to his lower back and right hip area.  He denies specific injury fall or trauma directly to the hip or back.  Has history of sciatica in the past, but feels slightly different.  Denies any numbness or tingling.  Denies saddle anesthesia.  Denies change in urination or bowel movements.  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    Anxiety    Arthritis    DDD lumbar, arthritis knees   DJD (degenerative joint disease) of lumbar spine    GERD (gastroesophageal reflux disease)    Hemorrhoid    internal   History of bowel resection    Hypertension    EKG 12/12 EPIC   no  PCP- states increased lately but hasnt been diagnosed formally   Inguinal hernia    Pneumonia    as child, cough at present with no fever   Pneumonia    as child, cough at present with no fever    Radiation 11/19/11-01/08/12   5040 cGy 28 fx Pelvis and inguinal area    Patient Active Problem List   Diagnosis Date Noted   Partial small bowel obstruction ([place]) 03/10/2020   Small bowel obstruction due to adhesions ([place]) 03/08/2020   Enteritis 10/15/2019   Abnormal liver function 10/15/2019   Leukocytosis 10/15/2019   Nausea & vomiting 10/15/2019   GAD (generalized anxiety disorder) 05/18/2017   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   Constipation 04/11/2017   Bee sting 04/05/2017   Diverticulosis 11/29/2014   Healthcare maintenance 11/06/2014   Cough 10/05/2014   Insomnia  09/13/2014   Erectile dysfunction 11/30/2013   Chronic pain syndrome 11/30/2013   Depression 11/30/2013   GERD (gastroesophageal reflux disease)    Hypertension    Anxiety    Arthritis    DJD (degenerative joint disease) of lumbar spine    Radiation    Night sweats 08/12/2012   Tobacco abuse counseling 05/26/2012   Lymphocytic colitis 05/03/2012   B12 nutritional deficiency 03/19/2012   Folate deficiency 03/19/2012   Inguinal hernia 11/24/2011   Cancer ([place]) 10/14/2011   Internal hemorrhoid 09/11/2011   Anal cancer ([place]) 10/09/2010    Past Surgical History:  Procedure Laterality Date   APPENDECTOMY     age 64 or 27   EXAMINATION UNDER ANESTHESIA  10/14/2011   Procedure: EXAM UNDER ANESTHESIA;  Surgeon: Earnstine Regal, MD;  Location: [facility];  Service: General;  Laterality: N/A;  exam under anethesia, Excision of mass anal canal, 1.5cm   INGUINAL HERNIA REPAIR  05/11/2012   Procedure: HERNIA REPAIR INGUINAL ADULT;  Surgeon: Earnstine Regal, MD;  Location: [facility];  Service: General;  Laterality: Left;  left inguinal hernia repair with mesh   surgical pathology   10/14/2011   squamous cell ca of anus   transanal excision  10/14/2011   [place]  Medications    Prior to Admission medications   Medication Sig Start Date End Date Taking? Authorizing Provider  EPINEPHrine (EPIPEN 2-PAK) 0.3 mg/0.3 mL IJ SOAJ injection Inject 0.3 mLs (0.3 mg total) into the muscle as needed for anaphylaxis. 02/23/20   Long, Wonda Olds, MD  predniSONE (DELTASONE) 10 MG tablet Begin with 6 tabs on day 1, 5 tab on day 2, 4 tab on day 3, 3 tab on day 4, 2 tab on day 5, 1 tab on day 6-take with food 06/13/20   Prabhnoor Ellenberger, [employer] C, PA-C  tamsulosin (FLOMAX) 0.4 MG CAPS capsule Take 1 capsule (0.4 mg total) by mouth at bedtime. 03/22/20   Gery Pray, MD  tiZANidine (ZANAFLEX) 4 MG tablet Take 1 tablet (4 mg total) by mouth every 6 (six) hours as needed for muscle  spasms. 06/13/20   Missy Baksh C, PA-C  omeprazole (PRILOSEC) 20 MG capsule Take 1 capsule (20 mg total) by mouth daily. Patient not taking: Reported on 11/28/2019 10/21/19 06/13/20  Palumbo, April, MD  sucralfate (CARAFATE) 1 GM/10ML suspension Take 10 mLs (1 g total) by mouth 4 (four) times daily -  with meals and at bedtime. Patient not taking: Reported on 11/28/2019 10/21/19 06/13/20  Randal Buba, April, MD    Family History Family History  Problem Relation Age of Onset   Colon cancer Father    Asthma Father    Cancer Father        prostate   Hypertension Father    Asthma Mother    Hypertension Mother    Stomach cancer Neg Hx    Esophageal cancer Neg Hx     Social History Social History   Tobacco Use   Smoking status: Current Every Day Smoker    Packs/day: 1.25    Years: 47.00    Pack years: 58.75    Types: Cigarettes   Smokeless tobacco: Never Used   Tobacco comment: Cutting back>> Quit Smart card supplied  Substance Use Topics   Alcohol use: Yes    Alcohol/week: 6.0 standard drinks    Types: 6 Cans of beer per week    Comment: weekend    6pk weekend, beer    Drug use: Yes    Types: Marijuana, Cocaine    Comment: marijuana in past, occasionally now-last use 3-4 weeks ago     Allergies   Yellow jacket venom [bee venom], Aspirin, Codeine, and Morphine and related   Review of Systems Review of Systems  Constitutional: Negative for fatigue and fever.  Eyes: Negative for redness, itching and visual disturbance.  Respiratory: Negative for shortness of breath.   Cardiovascular: Negative for chest pain and leg swelling.  Gastrointestinal: Negative for nausea and vomiting.  Genitourinary: Negative for decreased urine volume and difficulty urinating.  Musculoskeletal: Positive for back pain and myalgias. Negative for arthralgias.  Skin: Negative for color change, rash and wound.  Neurological: Negative for dizziness, syncope, weakness, light-headedness and  headaches.     Physical Exam Triage Vital Signs ED Triage Vitals [06/13/20 1936]  Enc Vitals Group     BP (!) 156/64     Pulse Rate 83     Resp 18     Temp 98.3 F (36.8 C)     Temp Source Oral     SpO2 95 %     Weight      Height      Head Circumference      Peak Flow      Pain Score 8  Pain Loc      Pain Edu?      Excl. in [place]?    No data found.  Updated Vital Signs BP (!) 156/64 (BP Location: Left Arm)    Pulse 83    Temp 98.3 F (36.8 C) (Oral)    Resp 18    SpO2 95%   Visual Acuity Right Eye Distance:   Left Eye Distance:   Bilateral Distance:    Right Eye Near:   Left Eye Near:    Bilateral Near:     Physical Exam Vitals and nursing note reviewed.  Constitutional:      Appearance: He is well-developed.     Comments: No acute distress  HENT:     Head: Normocephalic and atraumatic.     Nose: Nose normal.  Eyes:     Conjunctiva/sclera: Conjunctivae normal.  Cardiovascular:     Rate and Rhythm: Normal rate.  Pulmonary:     Effort: Pulmonary effort is normal. No respiratory distress.  Abdominal:     General: There is no distension.  Musculoskeletal:        General: Normal range of motion.     Cervical back: Neck supple.     Comments: Patient sitting avoiding pressure towards right side, nontender to palpation along thoracic and lumbar spine midline, increased tenderness throughout lower lumbar and upper right sacral area.  Tenderness extending to lateral proximal right leg  Strength 5/5 and equal bilaterally, pain elicited significantly with resisted hip flexion, positive straight leg raise  Skin:    General: Skin is warm and dry.  Neurological:     Mental Status: He is alert and oriented to person, place, and time.      UC Treatments / Results  Labs (all labs ordered are listed, but only abnormal results are displayed) Labs Reviewed - No data to display  EKG   Radiology No results found.  Procedures Procedures (including critical  care time)  Medications Ordered in UC Medications  ketorolac (TORADOL) 30 MG/ML injection 30 mg (30 mg Intramuscular Given 06/13/20 1958)    Initial Impression / Assessment and Plan / UC Course  I have reviewed the triage vital signs and the nursing notes.  Pertinent labs & imaging results that were available during my care of the patient were reviewed by me and considered in my medical decision making (see chart for details).     Suspect likely lower lumbar/sacral strain/sciatica.  Low suspicion of underlying acute bony abnormality.  Treating with Toradol prior to discharge and continuing on prednisone taper x6 days, may supplement tizanidine.  Has oxycodone prescribed to him chronically, may use for severe pain or use as prescribed.  No red flags for cauda equina.  Discussed activity modification.  Discussed strict return precautions. Patient verbalized understanding and is agreeable with plan.  Final Clinical Impressions(s) / UC Diagnoses   Final diagnoses:  Acute right-sided low back pain with right-sided sciatica     Discharge Instructions     We gave you an injection of Toradol Begin prednisone taper beginning tomorrow morning-begin with 6 tablets, decrease by 1 tablet each day until complete-6, 5, 4, 3, 2, 1-take with food in the morning Supplement with tizanidine which is a muscle relaxer as needed, please limit use to at home or bedtime May use oxycodone which you previously have prescribed to you for severe pain Avoid bedrest, avoid heavy lifting Gentle stretching, alternate ice and heat Follow-up if not improving or worsening   ED Prescriptions  Medication Sig Dispense Auth. Provider   predniSONE (DELTASONE) 10 MG tablet Begin with 6 tabs on day 1, 5 tab on day 2, 4 tab on day 3, 3 tab on day 4, 2 tab on day 5, 1 tab on day 6-take with food 21 tablet Aston Lawhorn C, PA-C   tiZANidine (ZANAFLEX) 4 MG tablet Take 1 tablet (4 mg total) by mouth every 6 (six) hours  as needed for muscle spasms. 30 tablet Calaya Gildner, Morton C, PA-C     I have reviewed the PDMP during this encounter.   Janith Lima, [place] 06/14/20 [id number]

## 2020-06-14 NOTE — Telephone Encounter (Signed)
Pharmacy did not have prednisone prescription, sending over prednisone taper to [facility] on [address] for patient.

## 2020-07-10 ENCOUNTER — Other Ambulatory Visit: Payer: Self-pay | Admitting: Radiation Oncology

## 2020-07-10 MED ORDER — OXYCODONE-ACETAMINOPHEN 5-325 MG PO TABS: 1.0000 | ORAL_TABLET | ORAL | 0 refills | Status: DC | PRN

## 2020-08-06 ENCOUNTER — Telehealth: Payer: Self-pay | Admitting: *Deleted

## 2020-08-06 NOTE — Telephone Encounter (Signed)
Patient calling to see if he can get a refill of his Oxycodone to [facility] pharmacy.  Routed to Dr. Sondra Come

## 2020-08-07 ENCOUNTER — Other Ambulatory Visit: Payer: Self-pay

## 2020-08-07 ENCOUNTER — Other Ambulatory Visit: Payer: Self-pay | Admitting: Radiation Oncology

## 2020-08-07 ENCOUNTER — Ambulatory Visit
Admission: EM | Admit: 2020-08-07 | Discharge: 2020-08-07 | Disposition: A | Discharge status: Home / Self Care | Payer: Medicaid Other | Attending: Emergency Medicine | Admitting: Emergency Medicine

## 2020-08-07 ENCOUNTER — Ambulatory Visit (INDEPENDENT_AMBULATORY_CARE_PROVIDER_SITE_OTHER): Payer: Medicaid Other

## 2020-08-07 DIAGNOSIS — J410 Simple chronic bronchitis: Secondary | ICD-10-CM

## 2020-08-07 DIAGNOSIS — R0602 Shortness of breath: Secondary | ICD-10-CM | POA: Diagnosis not present

## 2020-08-07 DIAGNOSIS — R059 Cough, unspecified: Secondary | ICD-10-CM

## 2020-08-07 MED ORDER — ALBUTEROL SULFATE HFA 108 (90 BASE) MCG/ACT IN AERS
2.0000 | INHALATION_SPRAY | Freq: Four times a day (QID) | RESPIRATORY_TRACT | 2 refills | Status: DC | PRN
Start: 2020-08-07 — End: 2021-02-09

## 2020-08-07 MED ORDER — OXYCODONE-ACETAMINOPHEN 5-325 MG PO TABS: 1.0000 | ORAL_TABLET | ORAL | 0 refills | Status: DC | PRN

## 2020-08-07 MED ORDER — PREDNISONE 50 MG PO TABS
50.0000 mg | ORAL_TABLET | Freq: Every day | ORAL | 0 refills | Status: DC
Start: 2020-08-07 — End: 2020-08-29

## 2020-08-07 MED ORDER — BENZONATATE 100 MG PO CAPS: 100.0000 mg | ORAL_CAPSULE | Freq: Three times a day (TID) | ORAL | 0 refills | Status: DC

## 2020-08-07 MED ORDER — AEROCHAMBER PLUS FLO-VU MEDIUM MISC
1.0000 | Freq: Once | 0 refills | Status: AC
Start: 2020-08-07 — End: 2020-08-07

## 2020-08-07 NOTE — Discharge Instructions (Signed)

## 2020-08-07 NOTE — ED Triage Notes (Signed)
Pt c/o prod cough/ SOB x 1 month-NAD-steady gait

## 2020-08-07 NOTE — ED Provider Notes (Signed)
[name]    CSN: [id number] Arrival date & time: 08/07/20  1147      History   Chief Complaint Chief Complaint  Patient presents with  . Cough    HPI Martin [name] is a 59 y.o. male  With history as below presenting for productive cough with increased chest tightness and dyspnea for the last month.  Denies lower leg swelling, chest pain or palpitations, lightheadedness, fever.  No known sick contacts.  Voicing concern for pneumonia as his mother has history thereof.  Patient continues to smoke.  Past Medical History:  Diagnosis Date  . Allergy 2 2013  . 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 Anxiety   . Arthritis    DDD lumbar, arthritis knees  . DJD (degenerative joint disease) of lumbar spine   . GERD (gastroesophageal reflux disease)   . Hemorrhoid    internal  . History of bowel resection   . Hypertension    EKG 12/12 EPIC   no  PCP- states increased lately but hasnt been diagnosed formally  . Inguinal hernia   . Pneumonia    as child, cough at present with no fever  . Pneumonia    as child, cough at present with no fever   . Radiation 11/19/11-01/08/12   5040 cGy 28 fx Pelvis and inguinal area    Patient Active Problem List   Diagnosis Date Noted  . Partial small bowel obstruction ([place]) 03/10/2020  . Small bowel obstruction due to adhesions ([place]) 03/08/2020  . Enteritis 10/15/2019  . Abnormal liver function 10/15/2019  . Leukocytosis 10/15/2019  . Nausea & vomiting 10/15/2019  . GAD (generalized anxiety disorder) 05/18/2017  . 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 Constipation 04/11/2017  . Bee sting 04/05/2017  . Diverticulosis 11/29/2014  . Healthcare maintenance 11/06/2014  . Cough 10/05/2014  . Insomnia 09/13/2014  . Erectile dysfunction 11/30/2013  . Chronic pain syndrome 11/30/2013  . Depression 11/30/2013  . GERD (gastroesophageal reflux disease)   . Hypertension   . Anxiety   . Arthritis     . DJD (degenerative joint disease) of lumbar spine   . Radiation   . Night sweats 08/12/2012  . Tobacco abuse counseling 05/26/2012  . 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 B12 nutritional deficiency 03/19/2012  . Folate deficiency 03/19/2012  . Inguinal hernia 11/24/2011  . Cancer ([place]) 10/14/2011  . Internal hemorrhoid 09/11/2011  . Anal cancer ([place]) 10/09/2010    Past Surgical History:  Procedure Laterality Date  . APPENDECTOMY     age 54 or 63  . EXAMINATION UNDER ANESTHESIA  10/14/2011   Procedure: EXAM UNDER ANESTHESIA;  Surgeon: Earnstine Regal, MD;  Location: [facility];  Service: General;  Laterality: N/A;  exam under anethesia, Excision of mass anal canal, 1.5cm  . INGUINAL HERNIA REPAIR  05/11/2012   Procedure: HERNIA REPAIR INGUINAL ADULT;  Surgeon: Earnstine Regal, MD;  Location: [facility];  Service: General;  Laterality: Left;  left inguinal hernia repair with mesh  . surgical pathology   10/14/2011   squamous cell ca of anus  . transanal excision  10/14/2011   Dr.Todd Gerkin       Home Medications    Prior to Admission medications   Medication Sig Start Date End Date Taking? Authorizing Provider  albuterol (VENTOLIN HFA) 108 (90 Base) MCG/ACT inhaler Inhale 2 puffs into the lungs every 6 (six) hours as needed for wheezing or  shortness of breath. 08/07/20   Hall-Potvin, [place], PA-C  benzonatate (TESSALON) 100 MG capsule Take 1 capsule (100 mg total) by mouth every 8 (eight) hours. 08/07/20   Hall-Potvin, [place], PA-C  EPINEPHrine (EPIPEN 2-PAK) 0.3 mg/0.3 mL IJ SOAJ injection Inject 0.3 mLs (0.3 mg total) into the muscle as needed for anaphylaxis. 02/23/20   Long, Wonda Olds, MD  oxyCODONE-acetaminophen (PERCOCET/ROXICET) 5-325 MG tablet Take 1 tablet by mouth every 4 (four) hours as needed for severe pain. 08/07/20   Gery Pray, MD  predniSONE (DELTASONE) 50 MG tablet Take 1 tablet (50 mg total) by mouth daily with breakfast. 08/07/20   Hall-Potvin, [place],  PA-C  Spacer/Aero-Holding Chambers (AEROCHAMBER PLUS FLO-VU MEDIUM) MISC 1 each by Other route once for 1 dose. 08/07/20 08/07/20  Hall-Potvin, [place], PA-C  tamsulosin (FLOMAX) 0.4 MG CAPS capsule Take 1 capsule (0.4 mg total) by mouth at bedtime. 03/22/20   Gery Pray, MD  omeprazole (PRILOSEC) 20 MG capsule Take 1 capsule (20 mg total) by mouth daily. Patient not taking: Reported on 11/28/2019 10/21/19 06/13/20  Palumbo, April, MD  sucralfate (CARAFATE) 1 GM/10ML suspension Take 10 mLs (1 g total) by mouth 4 (four) times daily -  with meals and at bedtime. Patient not taking: Reported on 11/28/2019 10/21/19 06/13/20  Veatrice Kells, MD    Family History Family History  Problem Relation Age of Onset  . Colon cancer Father   . Asthma Father   . Cancer Father        prostate  . Hypertension Father   . Asthma Mother   . Hypertension Mother   . Stomach cancer Neg Hx   . Esophageal cancer Neg Hx     Social History Social History   Tobacco Use  . Smoking status: Current Every Day Smoker    Packs/day: 1.25    Years: 47.00    Pack years: 58.75    Types: Cigarettes  . Smokeless tobacco: Never Used  . Tobacco comment: Cutting back>> Quit Smart card supplied  Substance Use Topics  . Alcohol use: Yes    Alcohol/week: 6.0 standard drinks    Types: 6 Cans of beer per week    Comment: weekend    6pk weekend, beer   . Drug use: Yes    Types: Marijuana, Cocaine    Comment: marijuana in past, occasionally now-last use 3-4 weeks ago     Allergies   Yellow jacket venom [bee venom], Aspirin, Codeine, and Morphine and related   Review of Systems Review of Systems  Constitutional: Negative for fatigue and fever.  HENT: Negative for congestion, dental problem, ear pain, facial swelling, hearing loss, sinus pain, sore throat, trouble swallowing and voice change.   Eyes: Negative for photophobia, pain and visual disturbance.  Respiratory: Positive for cough and chest tightness. Negative  for shortness of breath.   Cardiovascular: Negative for chest pain and palpitations.  Gastrointestinal: Negative for diarrhea and vomiting.  Musculoskeletal: Negative for arthralgias and myalgias.  Neurological: Negative for dizziness and headaches.     Physical Exam Triage Vital Signs ED Triage Vitals  Enc Vitals Group     BP 08/07/20 1233 (!) 150/67     Pulse Rate 08/07/20 1233 66     Resp 08/07/20 1233 18     Temp 08/07/20 1233 98.2 F (36.8 C)     Temp Source 08/07/20 1233 Oral     SpO2 08/07/20 1233 97 %     Weight 08/07/20 1230 150 lb (68 kg)  Height 08/07/20 1230 5' 10"  (1.778 m)     Head Circumference --      Peak Flow --      Pain Score 08/07/20 1230 0     Pain Loc --      Pain Edu? --      Excl. in [place]? --    No data found.  Updated Vital Signs BP (!) 150/67 (BP Location: Left Arm)   Pulse 66   Temp 98.2 F (36.8 C) (Oral)   Resp 18   Ht 5' 10"  (1.778 m)   Wt 150 lb (68 kg)   SpO2 97%   BMI 21.52 kg/m   Visual Acuity Right Eye Distance:   Left Eye Distance:   Bilateral Distance:    Right Eye Near:   Left Eye Near:    Bilateral Near:     Physical Exam Constitutional:      General: He is not in acute distress.    Appearance: He is not toxic-appearing or diaphoretic.  HENT:     Head: Normocephalic and atraumatic.     Right Ear: Tympanic membrane and ear canal normal.     Left Ear: Tympanic membrane and ear canal normal.     Mouth/Throat:     Mouth: Mucous membranes are moist.     Pharynx: Oropharynx is clear.  Eyes:     General: No scleral icterus.    Conjunctiva/sclera: Conjunctivae normal.     Pupils: Pupils are equal, round, and reactive to light.  Neck:     Comments: Trachea midline, negative JVD Cardiovascular:     Rate and Rhythm: Normal rate and regular rhythm.  Pulmonary:     Effort: Pulmonary effort is normal. No respiratory distress.     Breath sounds: Rhonchi present. No wheezing.     Comments: Diffuse,  moderate Musculoskeletal:     Cervical back: Neck supple. No tenderness.  Lymphadenopathy:     Cervical: No cervical adenopathy.  Skin:    Capillary Refill: Capillary refill takes less than 2 seconds.     Coloration: Skin is not jaundiced or pale.     Findings: No rash.  Neurological:     Mental Status: He is alert and oriented to person, place, and time.      UC Treatments / Results  Labs (all labs ordered are listed, but only abnormal results are displayed) Labs Reviewed - No data to display  EKG   Radiology DG Chest 2 View  Result Date: 08/07/2020 CLINICAL DATA:  Cough and shortness of breath EXAM: CHEST - 2 VIEW COMPARISON:  October 17, 2019 FINDINGS: There is mild generalized interstitial thickening. No edema or airspace opacity. Heart size and pulmonary vascularity are normal. No adenopathy. There is mild degenerative change in the thoracic spine. IMPRESSION: Mild generalized interstitial thickening, a finding likely indicative of a degree of chronic bronchitis. No edema or airspace opacity. Heart size normal. No evident adenopathy. Electronically Signed   By: Lowella Grip III M.D.   On: 08/07/2020 13:19    Procedures Procedures (including critical care time)  Medications Ordered in UC Medications - No data to display  Initial Impression / Assessment and Plan / UC Course  I have reviewed the triage vital signs and the nursing notes.  Pertinent labs & imaging results that were available during my care of the patient were reviewed by me and considered in my medical decision making (see chart for details).     CXR with mild generalized interstitial thickening, likely indicative  of chronic bronchitis.  Will treat supportively as below, follow-up with PCP for further evaluation management if needed.  Return precautions discussed, pt verbalized understanding and is agreeable to plan.   Final Clinical Impressions(s) / UC Diagnoses   Final diagnoses:  Simple chronic  bronchitis ([place])     Discharge Instructions     Tessalon for cough. Start flonase, atrovent nasal spray for nasal congestion/drainage. You can use over the counter nasal saline rinse such as neti pot for nasal congestion. Keep hydrated, your urine should be clear to pale yellow in color. Tylenol/motrin for fever and pain. Monitor for any worsening of symptoms, chest pain, shortness of breath, wheezing, swelling of the throat, go to the emergency department for further evaluation needed.     ED Prescriptions    Medication Sig Dispense Auth. Provider   predniSONE (DELTASONE) 50 MG tablet Take 1 tablet (50 mg total) by mouth daily with breakfast. 5 tablet Hall-Potvin, [place], PA-C   benzonatate (TESSALON) 100 MG capsule Take 1 capsule (100 mg total) by mouth every 8 (eight) hours. 21 capsule Hall-Potvin, [place], PA-C   albuterol (VENTOLIN HFA) 108 (90 Base) MCG/ACT inhaler Inhale 2 puffs into the lungs every 6 (six) hours as needed for wheezing or shortness of breath. 8 g Hall-Potvin, [place], PA-C   Spacer/Aero-Holding Chambers (AEROCHAMBER PLUS FLO-VU MEDIUM) MISC 1 each by Other route once for 1 dose. 1 each Hall-Potvin, [place], PA-C     PDMP not reviewed this encounter.   Hall-Potvin, [place], [place] 08/07/20 1417

## 2020-08-29 ENCOUNTER — Ambulatory Visit
Admission: EM | Admit: 2020-08-29 | Discharge: 2020-08-29 | Disposition: A | Discharge status: Home / Self Care | Payer: Medicaid Other | Attending: Emergency Medicine | Admitting: Emergency Medicine

## 2020-08-29 ENCOUNTER — Other Ambulatory Visit: Payer: Self-pay

## 2020-08-29 DIAGNOSIS — R1084 Generalized abdominal pain: Secondary | ICD-10-CM

## 2020-08-29 DIAGNOSIS — Z6829 Body mass index (BMI) 29.0-29.9, adult: Secondary | ICD-10-CM | POA: Insufficient documentation

## 2020-08-29 DIAGNOSIS — Z8719 Personal history of other diseases of the digestive system: Secondary | ICD-10-CM

## 2020-08-29 MED ORDER — ONDANSETRON 4 MG PO TBDP
4.0000 mg | ORAL_TABLET | Freq: Three times a day (TID) | ORAL | 0 refills | Status: DC | PRN
Start: 2020-08-29 — End: 2020-09-26

## 2020-08-29 NOTE — ED Triage Notes (Signed)
Pt c/o center to upper abdominal pain x3 days. States vomit black x1 3days ago when pain started. States pain worse when he eats. Last BM was this am.

## 2020-08-29 NOTE — ED Provider Notes (Signed)
[name]    CSN: [id number] Arrival date & time: 08/29/20  1649      History   Chief Complaint Chief Complaint  Patient presents with  . Abdominal Pain    HPI Martin [name] is a 59 y.o. male  With extensive history as below presenting for 3-day course of upper abdominal pain with nausea.  Had a single episode of black emesis at symptom onset, though none since.  Denying emesis, projectile vomiting, fever, diarrhea.  Last bowel movement this morning: Soft without melena or blood.  Does endorse abdominal distention.  Does not currently have GI doctor.  Is in remission for anal cancer.  Past Medical History:  Diagnosis Date  . Allergy 2 2013  . 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 Anxiety   . Arthritis    DDD lumbar, arthritis knees  . DJD (degenerative joint disease) of lumbar spine   . GERD (gastroesophageal reflux disease)   . Hemorrhoid    internal  . History of bowel resection   . Hypertension    EKG 12/12 EPIC   no  PCP- states increased lately but hasnt been diagnosed formally  . Inguinal hernia   . Pneumonia    as child, cough at present with no fever  . Pneumonia    as child, cough at present with no fever   . Radiation 11/19/11-01/08/12   5040 cGy 28 fx Pelvis and inguinal area    Patient Active Problem List   Diagnosis Date Noted  . Partial small bowel obstruction ([place]) 03/10/2020  . Small bowel obstruction due to adhesions ([place]) 03/08/2020  . Enteritis 10/15/2019  . Abnormal liver function 10/15/2019  . Leukocytosis 10/15/2019  . Nausea & vomiting 10/15/2019  . GAD (generalized anxiety disorder) 05/18/2017  . 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 Constipation 04/11/2017  . Bee sting 04/05/2017  . Diverticulosis 11/29/2014  . Healthcare maintenance 11/06/2014  . Cough 10/05/2014  . Insomnia 09/13/2014  . Erectile dysfunction 11/30/2013  . Chronic pain syndrome 11/30/2013  . Depression 11/30/2013   . GERD (gastroesophageal reflux disease)   . Hypertension   . Anxiety   . Arthritis   . DJD (degenerative joint disease) of lumbar spine   . Radiation   . Night sweats 08/12/2012  . Tobacco abuse counseling 05/26/2012  . 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 B12 nutritional deficiency 03/19/2012  . Folate deficiency 03/19/2012  . Inguinal hernia 11/24/2011  . Cancer ([place]) 10/14/2011  . Internal hemorrhoid 09/11/2011  . Anal cancer ([place]) 10/09/2010    Past Surgical History:  Procedure Laterality Date  . APPENDECTOMY     age 78 or 47  . EXAMINATION UNDER ANESTHESIA  10/14/2011   Procedure: EXAM UNDER ANESTHESIA;  Surgeon: Earnstine Regal, MD;  Location: [facility];  Service: General;  Laterality: N/A;  exam under anethesia, Excision of mass anal canal, 1.5cm  . INGUINAL HERNIA REPAIR  05/11/2012   Procedure: HERNIA REPAIR INGUINAL ADULT;  Surgeon: Earnstine Regal, MD;  Location: [place];  Service: General;  Laterality: Left;  left inguinal hernia repair with mesh  . surgical pathology   10/14/2011   squamous cell ca of anus  . transanal excision  10/14/2011   Dr.Todd Gerkin       Home Medications    Prior to Admission medications   Medication Sig Start Date End Date Taking? Authorizing Provider  albuterol (VENTOLIN HFA) 108 (90 Base) MCG/ACT  inhaler Inhale 2 puffs into the lungs every 6 (six) hours as needed for wheezing or shortness of breath. 08/07/20   Hall-Potvin, [place], PA-C  EPINEPHrine (EPIPEN 2-PAK) 0.3 mg/0.3 mL IJ SOAJ injection Inject 0.3 mLs (0.3 mg total) into the muscle as needed for anaphylaxis. 02/23/20   Long, Wonda Olds, MD  ondansetron (ZOFRAN ODT) 4 MG disintegrating tablet Take 1 tablet (4 mg total) by mouth every 8 (eight) hours as needed for nausea or vomiting. 08/29/20   Hall-Potvin, [place], PA-C  oxyCODONE-acetaminophen (PERCOCET/ROXICET) 5-325 MG tablet Take 1 tablet by mouth every 4 (four) hours as needed for severe pain. 08/07/20   Gery Pray,  MD  tamsulosin (FLOMAX) 0.4 MG CAPS capsule Take 1 capsule (0.4 mg total) by mouth at bedtime. 03/22/20   Gery Pray, MD  omeprazole (PRILOSEC) 20 MG capsule Take 1 capsule (20 mg total) by mouth daily. Patient not taking: Reported on 11/28/2019 10/21/19 06/13/20  Palumbo, April, MD  sucralfate (CARAFATE) 1 GM/10ML suspension Take 10 mLs (1 g total) by mouth 4 (four) times daily -  with meals and at bedtime. Patient not taking: Reported on 11/28/2019 10/21/19 06/13/20  Veatrice Kells, MD    Family History Family History  Problem Relation Age of Onset  . Colon cancer Father   . Asthma Father   . Cancer Father        prostate  . Hypertension Father   . Asthma Mother   . Hypertension Mother   . Stomach cancer Neg Hx   . Esophageal cancer Neg Hx     Social History Social History   Tobacco Use  . Smoking status: Current Every Day Smoker    Packs/day: 1.25    Years: 47.00    Pack years: 58.75    Types: Cigarettes  . Smokeless tobacco: Never Used  . Tobacco comment: Cutting back>> Quit Smart card supplied  Substance Use Topics  . Alcohol use: Not Currently    Alcohol/week: 6.0 standard drinks    Types: 6 Cans of beer per week    Comment: weekend    6pk weekend, beer   . Drug use: Not Currently    Types: Marijuana, Cocaine    Comment: marijuana in past, occasionally now-last use 3-4 weeks ago     Allergies   Yellow jacket venom [bee venom], Aspirin, Codeine, and Morphine and related   Review of Systems Review of Systems  Constitutional: Negative for fatigue and fever.  Respiratory: Negative for cough and shortness of breath.   Cardiovascular: Negative for chest pain and palpitations.  Gastrointestinal: Positive for abdominal distention, abdominal pain and nausea. Negative for blood in stool, diarrhea and vomiting.  Musculoskeletal: Negative for arthralgias and myalgias.  Skin: Negative for rash and wound.  Neurological: Negative for speech difficulty and headaches.  All  other systems reviewed and are negative.    Physical Exam Triage Vital Signs ED Triage Vitals  Enc Vitals Group     BP 08/29/20 1734 128/67     Pulse --      Resp 08/29/20 1734 20     Temp 08/29/20 1734 97.9 F (36.6 C)     Temp Source 08/29/20 1734 Oral     SpO2 08/29/20 1734 98 %     Weight --      Height --      Head Circumference --      Peak Flow --      Pain Score 08/29/20 1749 7     Pain Loc --  Pain Edu? --      Excl. in [place]? --    No data found.  Updated Vital Signs BP 128/67   Temp 97.9 F (36.6 C) (Oral)   Resp 20   SpO2 98%   Visual Acuity Right Eye Distance:   Left Eye Distance:   Bilateral Distance:    Right Eye Near:   Left Eye Near:    Bilateral Near:     Physical Exam Constitutional:      General: He is not in acute distress. HENT:     Head: Normocephalic and atraumatic.  Eyes:     General: No scleral icterus.    Pupils: Pupils are equal, round, and reactive to light.  Cardiovascular:     Rate and Rhythm: Normal rate.  Pulmonary:     Effort: Pulmonary effort is normal. No respiratory distress.     Breath sounds: No wheezing.  Abdominal:     General: Bowel sounds are normal.     Palpations: There is no hepatomegaly or splenomegaly.     Tenderness: There is abdominal tenderness in the right upper quadrant, epigastric area and left upper quadrant. There is guarding. There is no rebound. Negative signs include Murphy's sign, Rovsing's sign and McBurney's sign.     Comments: Mild abdominal distention.  Voluntary guarding.  Skin:    Coloration: Skin is not jaundiced or pale.  Neurological:     Mental Status: He is alert and oriented to person, place, and time.      UC Treatments / Results  Labs (all labs ordered are listed, but only abnormal results are displayed) Labs Reviewed - No data to display  EKG   Radiology No results found.  Procedures Procedures (including critical care time)  Medications Ordered in  UC Medications - No data to display  Initial Impression / Assessment and Plan / UC Course  I have reviewed the triage vital signs and the nursing notes.  Pertinent labs & imaging results that were available during my care of the patient were reviewed by me and considered in my medical decision making (see chart for details).     Patient febrile, nontoxic in office today.  Patient is s/p transanal excision of anal cancer as well as inguinal hernia repair with mesh.  Discussed risk of partial BS complete bowel obstruction secondary to adhesions.  Patient does not want to go to ER.  Will treat nausea as below, practice clear liquid diet, and go to ER for persistent or worsening symptoms over the next 12-24 hours.  Had a GI contact information for follow-up given extensive GI and oncologic history.  Return precautions discussed, pt verbalized understanding and is agreeable to plan. Final Clinical Impressions(s) / UC Diagnoses   Final diagnoses:  Generalized abdominal pain  History of small bowel obstruction   Discharge Instructions   None    ED Prescriptions    Medication Sig Dispense Auth. Provider   ondansetron (ZOFRAN ODT) 4 MG disintegrating tablet Take 1 tablet (4 mg total) by mouth every 8 (eight) hours as needed for nausea or vomiting. 21 tablet Hall-Potvin, [place], PA-C     PDMP not reviewed this encounter.   Neldon Mc [place], [place] 08/29/20 1817

## 2020-09-04 ENCOUNTER — Other Ambulatory Visit: Payer: Self-pay | Admitting: Radiation Oncology

## 2020-09-04 MED ORDER — OXYCODONE-ACETAMINOPHEN 5-325 MG PO TABS: 1.0000 | ORAL_TABLET | ORAL | 0 refills | Status: DC | PRN

## 2020-09-11 ENCOUNTER — Ambulatory Visit (HOSPITAL_COMMUNITY)
Admission: EM | Admit: 2020-09-11 | Discharge: 2020-09-11 | Discharge status: Against Medical Advice | Payer: Medicaid Other

## 2020-09-11 ENCOUNTER — Other Ambulatory Visit: Payer: Self-pay

## 2020-09-11 NOTE — ED Notes (Signed)
Patient was called twice with no response.

## 2020-09-25 ENCOUNTER — Ambulatory Visit
Admission: EM | Admit: 2020-09-25 | Discharge: 2020-09-25 | Discharge status: Against Medical Advice | Payer: Medicaid Other

## 2020-09-25 ENCOUNTER — Other Ambulatory Visit: Payer: Self-pay

## 2020-09-25 NOTE — ED Notes (Signed)
No answer, message left

## 2020-09-25 NOTE — ED Notes (Signed)
No answer in waiting area.

## 2020-09-26 ENCOUNTER — Encounter: Payer: Self-pay | Admitting: Emergency Medicine

## 2020-09-26 ENCOUNTER — Ambulatory Visit
Admission: EM | Admit: 2020-09-26 | Discharge: 2020-09-26 | Disposition: A | Discharge status: Home / Self Care | Payer: Medicaid Other | Attending: Emergency Medicine | Admitting: Emergency Medicine

## 2020-09-26 ENCOUNTER — Other Ambulatory Visit: Payer: Self-pay

## 2020-09-26 DIAGNOSIS — R1084 Generalized abdominal pain: Secondary | ICD-10-CM | POA: Diagnosis not present

## 2020-09-26 DIAGNOSIS — R112 Nausea with vomiting, unspecified: Secondary | ICD-10-CM

## 2020-09-26 DIAGNOSIS — R030 Elevated blood-pressure reading, without diagnosis of hypertension: Secondary | ICD-10-CM | POA: Insufficient documentation

## 2020-09-26 MED ORDER — OMEPRAZOLE 20 MG PO CPDR
20.0000 mg | DELAYED_RELEASE_CAPSULE | Freq: Two times a day (BID) | ORAL | 0 refills | Status: DC
Start: 2020-09-26 — End: 2021-06-28

## 2020-09-26 MED ORDER — ONDANSETRON 4 MG PO TBDP
4.0000 mg | ORAL_TABLET | Freq: Three times a day (TID) | ORAL | 0 refills | Status: DC | PRN
Start: 2020-09-26 — End: 2021-02-25

## 2020-09-26 NOTE — Discharge Instructions (Signed)
Please have a clear liquid diet over the next 48 hours Zofran for nausea May begin omeprazole twice daily to help with acid Supplement with Pepcid or Maalox over-the-counter for more immediate relief of any acid/indigestion symptoms Please go to the emergency room if you are developing decreased bowel movements, worsening abdominal pain, distention

## 2020-09-26 NOTE — ED Provider Notes (Signed)
[name]    CSN: [id number] Arrival date & time: 09/26/20  1111      History   Chief Complaint Chief Complaint  Patient presents with  . Abdominal Pain  . Emesis    HPI Martin [name] is a 60 y.o. male history of recurrent bowel obstruction, anal cancer status postresection, hypertension, GERD presenting today for evaluation of nausea nausea and abdominal pain.  Reports increased discomfort after eating a prime rib over the past 4 days.  Reports nausea with vomiting.  And generalized abdominal pain.  Reports that he is passing his bowels which is been different from when he has typically had bowel obstruction in the past and pain is not as severe.  He previously has been on acid medicine, but denies currently.  Denies any fevers chills or body aches.  Denies URI symptoms.  HPI  Past Medical History:  Diagnosis Date  . Allergy 2 2013  . 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 Anxiety   . Arthritis    DDD lumbar, arthritis knees  . DJD (degenerative joint disease) of lumbar spine   . GERD (gastroesophageal reflux disease)   . Hemorrhoid    internal  . History of bowel resection   . Hypertension    EKG 12/12 EPIC   no  PCP- states increased lately but hasnt been diagnosed formally  . Inguinal hernia   . Pneumonia    as child, cough at present with no fever  . Pneumonia    as child, cough at present with no fever   . Radiation 11/19/11-01/08/12   5040 cGy 28 fx Pelvis and inguinal area    Patient Active Problem List   Diagnosis Date Noted  . Partial small bowel obstruction ([place]) 03/10/2020  . Small bowel obstruction due to adhesions ([place]) 03/08/2020  . Enteritis 10/15/2019  . Abnormal liver function 10/15/2019  . Leukocytosis 10/15/2019  . Nausea & vomiting 10/15/2019  . GAD (generalized anxiety disorder) 05/18/2017  . 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 Constipation 04/11/2017  . Bee sting 04/05/2017  .  Diverticulosis 11/29/2014  . Healthcare maintenance 11/06/2014  . Cough 10/05/2014  . Insomnia 09/13/2014  . Erectile dysfunction 11/30/2013  . Chronic pain syndrome 11/30/2013  . Depression 11/30/2013  . GERD (gastroesophageal reflux disease)   . Hypertension   . Anxiety   . Arthritis   . DJD (degenerative joint disease) of lumbar spine   . Radiation   . Night sweats 08/12/2012  . Tobacco abuse counseling 05/26/2012  . 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 B12 nutritional deficiency 03/19/2012  . Folate deficiency 03/19/2012  . Inguinal hernia 11/24/2011  . Cancer ([place]) 10/14/2011  . Internal hemorrhoid 09/11/2011  . Anal cancer ([place]) 10/09/2010    Past Surgical History:  Procedure Laterality Date  . APPENDECTOMY     age 38 or 5  . EXAMINATION UNDER ANESTHESIA  10/14/2011   Procedure: EXAM UNDER ANESTHESIA;  Surgeon: Earnstine Regal, MD;  Location: [facility];  Service: General;  Laterality: N/A;  exam under anethesia, Excision of mass anal canal, 1.5cm  . INGUINAL HERNIA REPAIR  05/11/2012   Procedure: HERNIA REPAIR INGUINAL ADULT;  Surgeon: Earnstine Regal, MD;  Location: [place];  Service: General;  Laterality: Left;  left inguinal hernia repair with mesh  . surgical pathology   10/14/2011   squamous cell ca of anus  . transanal excision  10/14/2011   Dr.Todd  Gerkin       Home Medications    Prior to Admission medications   Medication Sig Start Date End Date Taking? Authorizing Provider  omeprazole (PRILOSEC) 20 MG capsule Take 1 capsule (20 mg total) by mouth 2 (two) times daily before a meal for 15 days. 09/26/20 10/11/20 Yes Martin [name] C, PA-C  ondansetron (ZOFRAN ODT) 4 MG disintegrating tablet Take 1 tablet (4 mg total) by mouth every 8 (eight) hours as needed for nausea or vomiting. 09/26/20  Yes Tarris Delbene C, PA-C  albuterol (VENTOLIN HFA) 108 (90 Base) MCG/ACT inhaler Inhale 2 puffs into the lungs every 6 (six) hours as needed for wheezing or shortness  of breath. 08/07/20   Hall-Potvin, [place], PA-C  EPINEPHrine (EPIPEN 2-PAK) 0.3 mg/0.3 mL IJ SOAJ injection Inject 0.3 mLs (0.3 mg total) into the muscle as needed for anaphylaxis. 02/23/20   Long, Wonda Olds, MD  oxyCODONE-acetaminophen (PERCOCET/ROXICET) 5-325 MG tablet Take 1 tablet by mouth every 4 (four) hours as needed for severe pain. 09/04/20   Gery Pray, MD  tamsulosin (FLOMAX) 0.4 MG CAPS capsule Take 1 capsule (0.4 mg total) by mouth at bedtime. 03/22/20   Gery Pray, MD  sucralfate (CARAFATE) 1 GM/10ML suspension Take 10 mLs (1 g total) by mouth 4 (four) times daily -  with meals and at bedtime. Patient not taking: Reported on 11/28/2019 10/21/19 06/13/20  Veatrice Kells, MD    Family History Family History  Problem Relation Age of Onset  . Colon cancer Father   . Asthma Father   . Cancer Father        prostate  . Hypertension Father   . Asthma Mother   . Hypertension Mother   . Stomach cancer Neg Hx   . Esophageal cancer Neg Hx     Social History Social History   Tobacco Use  . Smoking status: Current Every Day Smoker    Packs/day: 1.25    Years: 47.00    Pack years: 58.75    Types: Cigarettes  . Smokeless tobacco: Never Used  . Tobacco comment: Cutting back>> Quit Smart card supplied  Substance Use Topics  . Alcohol use: Not Currently    Alcohol/week: 6.0 standard drinks    Types: 6 Cans of beer per week    Comment: weekend    6pk weekend, beer   . Drug use: Not Currently    Types: Marijuana, Cocaine    Comment: marijuana in past, occasionally now-last use 3-4 weeks ago     Allergies   Yellow jacket venom [bee venom], Aspirin, Codeine, and Morphine and related   Review of Systems Review of Systems  Constitutional: Negative for fatigue and fever.  Eyes: Negative for redness, itching and visual disturbance.  Respiratory: Negative for shortness of breath.   Cardiovascular: Negative for chest pain and leg swelling.  Gastrointestinal: Positive for  abdominal pain and nausea. Negative for vomiting.  Musculoskeletal: Negative for arthralgias and myalgias.  Skin: Negative for color change, rash and wound.  Neurological: Negative for dizziness, syncope, weakness, light-headedness and headaches.     Physical Exam Triage Vital Signs ED Triage Vitals  Enc Vitals Group     BP      Pulse      Resp      Temp      Temp src      SpO2      Weight      Height      Head Circumference      Peak  Flow      Pain Score      Pain Loc      Pain Edu?      Excl. in [place]?    No data found.  Updated Vital Signs BP 137/63 (BP Location: Left Arm)   Pulse 76   Temp 98 F (36.7 C) (Oral)   Resp 18   SpO2 98%   Visual Acuity Right Eye Distance:   Left Eye Distance:   Bilateral Distance:    Right Eye Near:   Left Eye Near:    Bilateral Near:     Physical Exam Vitals and nursing note reviewed.  Constitutional:      Appearance: He is well-developed and well-nourished.     Comments: No acute distress  HENT:     Head: Normocephalic and atraumatic.     Nose: Nose normal.  Eyes:     Conjunctiva/sclera: Conjunctivae normal.  Cardiovascular:     Rate and Rhythm: Normal rate.  Pulmonary:     Effort: Pulmonary effort is normal. No respiratory distress.  Abdominal:     General: There is no distension.     Comments: Soft, nondistended, well-healed surgical scar to lower abdomen midline, generalized tenderness throughout entire abdomen, negative rebound, negative Rovsing, negative McBurney's, negative Murphy's  Musculoskeletal:        General: Normal range of motion.     Cervical back: Neck supple.  Skin:    General: Skin is warm and dry.  Neurological:     Mental Status: He is alert and oriented to person, place, and time.  Psychiatric:        Mood and Affect: Mood and affect normal.      UC Treatments / Results  Labs (all labs ordered are listed, but only abnormal results are displayed) Labs Reviewed - No data to  display  EKG   Radiology No results found.  Procedures Procedures (including critical care time)  Medications Ordered in UC Medications - No data to display  Initial Impression / Assessment and Plan / UC Course  I have reviewed the triage vital signs and the nursing notes.  Pertinent labs & imaging results that were available during my care of the patient were reviewed by me and considered in my medical decision making (see chart for details).     Generalized abdominal pain with nausea-discussed with patient given his history concern for possible recurrent bowel obstruction.  In the past he has managed at home and symptoms do not feels as severe as when he has previously been admitted for this.  We will proceed with clear liquid diet Zofran for nausea and will reinitiate on medicine for GERD.  Advised patient if he develops increased pain, distention or no longer passing bowels he needs to go to the emergency room.  Discussed strict return precautions. Patient verbalized understanding and is agreeable with plan.  Final Clinical Impressions(s) / UC Diagnoses   Final diagnoses:  Generalized abdominal pain  Non-intractable vomiting with nausea, unspecified vomiting type     Discharge Instructions     Please have a clear liquid diet over the next 48 hours Zofran for nausea May begin omeprazole twice daily to help with acid Supplement with Pepcid or Maalox over-the-counter for more immediate relief of any acid/indigestion symptoms Please go to the emergency room if you are developing decreased bowel movements, worsening abdominal pain, distention      ED Prescriptions    Medication Sig Dispense Auth. Provider   ondansetron (ZOFRAN ODT) 4  MG disintegrating tablet Take 1 tablet (4 mg total) by mouth every 8 (eight) hours as needed for nausea or vomiting. 20 tablet Sharifa Bucholz C, PA-C   omeprazole (PRILOSEC) 20 MG capsule Take 1 capsule (20 mg total) by mouth 2 (two) times  daily before a meal for 15 days. 30 capsule Martin [name], Arjay C, PA-C     PDMP not reviewed this encounter.   Janith L[place], [place] 09/26/20 [phone]

## 2020-09-26 NOTE — ED Triage Notes (Signed)
Pt here for abd pain and some vomiting x several days with hx of similar; pt sts had several normal for him BMs but no improvement

## 2020-10-04 ENCOUNTER — Other Ambulatory Visit: Payer: Self-pay | Admitting: Radiation Oncology

## 2020-10-04 MED ORDER — OXYCODONE-ACETAMINOPHEN 5-325 MG PO TABS: 1.0000 | ORAL_TABLET | ORAL | 0 refills | Status: DC | PRN

## 2020-10-08 ENCOUNTER — Other Ambulatory Visit: Payer: Self-pay | Admitting: Radiation Oncology

## 2020-10-08 ENCOUNTER — Other Ambulatory Visit: Payer: Self-pay | Admitting: *Deleted

## 2020-10-08 DIAGNOSIS — C801 Malignant (primary) neoplasm, unspecified: Secondary | ICD-10-CM

## 2020-10-08 MED ORDER — TAMSULOSIN HCL 0.4 MG PO CAPS: 0.4000 mg | ORAL_CAPSULE | Freq: Every day | ORAL | 2 refills | Status: DC

## 2020-10-09 ENCOUNTER — Other Ambulatory Visit: Payer: Self-pay | Admitting: Radiation Oncology

## 2020-10-09 DIAGNOSIS — C801 Malignant (primary) neoplasm, unspecified: Secondary | ICD-10-CM

## 2020-10-09 MED ORDER — TAMSULOSIN HCL 0.4 MG PO CAPS: 0.4000 mg | ORAL_CAPSULE | Freq: Every day | ORAL | 2 refills | Status: DC

## 2020-11-01 ENCOUNTER — Other Ambulatory Visit: Payer: Self-pay | Admitting: Radiation Oncology

## 2020-11-01 MED ORDER — OXYCODONE-ACETAMINOPHEN 5-325 MG PO TABS: 1.0000 | ORAL_TABLET | ORAL | 0 refills | Status: DC | PRN

## 2020-11-27 ENCOUNTER — Telehealth: Payer: Self-pay

## 2020-11-27 ENCOUNTER — Other Ambulatory Visit: Payer: Self-pay | Admitting: Radiation Oncology

## 2020-11-27 MED ORDER — OXYCODONE-ACETAMINOPHEN 5-325 MG PO TABS: 1.0000 | ORAL_TABLET | ORAL | 0 refills | Status: DC | PRN

## 2020-11-27 NOTE — Telephone Encounter (Signed)
Patient called asking for a refill of oxycodone to be sent to [facility] pharmacy. Routed to Dr. Sondra Come.

## 2020-12-06 ENCOUNTER — Telehealth: Payer: Self-pay | Admitting: Radiology

## 2020-12-06 NOTE — Telephone Encounter (Signed)
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 He recommends that oxycodone 36m be discontinued. Will let Dr KSondra Comeknow.

## 2020-12-06 NOTE — Addendum Note (Signed)
Addended by: Gery Pray D on: 12/06/2020 08:05 PM   Modules accepted: Orders

## 2020-12-27 ENCOUNTER — Telehealth: Payer: Self-pay | Admitting: Radiology

## 2020-12-27 ENCOUNTER — Encounter: Payer: Self-pay | Admitting: Radiology

## 2020-12-27 NOTE — Telephone Encounter (Signed)
Patient [facility]

## 2021-01-02 NOTE — Telephone Encounter (Signed)
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 Patient verbalized understanding of call.

## 2021-01-04 DIAGNOSIS — M25851 Other specified joint disorders, right hip: Secondary | ICD-10-CM | POA: Insufficient documentation

## 2021-01-23 ENCOUNTER — Ambulatory Visit: Payer: Medicaid Other | Admitting: Orthopaedic Surgery

## 2021-01-29 ENCOUNTER — Ambulatory Visit: Payer: Medicare Other | Admitting: Orthopaedic Surgery

## 2021-02-09 ENCOUNTER — Ambulatory Visit
Admission: EM | Admit: 2021-02-09 | Discharge: 2021-02-09 | Disposition: A | Discharge status: Home / Self Care | Payer: Medicare Other | Attending: Urgent Care | Admitting: Urgent Care

## 2021-02-09 ENCOUNTER — Encounter: Payer: Self-pay | Admitting: Emergency Medicine

## 2021-02-09 ENCOUNTER — Ambulatory Visit (INDEPENDENT_AMBULATORY_CARE_PROVIDER_SITE_OTHER): Payer: Medicare Other

## 2021-02-09 ENCOUNTER — Other Ambulatory Visit: Payer: Self-pay

## 2021-02-09 DIAGNOSIS — R058 Other specified cough: Secondary | ICD-10-CM | POA: Diagnosis not present

## 2021-02-09 DIAGNOSIS — R059 Cough, unspecified: Secondary | ICD-10-CM | POA: Diagnosis not present

## 2021-02-09 DIAGNOSIS — F17200 Nicotine dependence, unspecified, uncomplicated: Secondary | ICD-10-CM

## 2021-02-09 DIAGNOSIS — Z1152 Encounter for screening for COVID-19: Secondary | ICD-10-CM

## 2021-02-09 MED ORDER — PROMETHAZINE-DM 6.25-15 MG/5ML PO SYRP: 5.0000 mL | ORAL_SOLUTION | Freq: Every evening | ORAL | 0 refills | Status: DC | PRN

## 2021-02-09 MED ORDER — PREDNISONE 20 MG PO TABS: ORAL_TABLET | ORAL | 0 refills | Status: DC

## 2021-02-09 MED ORDER — ALBUTEROL SULFATE HFA 108 (90 BASE) MCG/ACT IN AERS: 2.0000 | INHALATION_SPRAY | Freq: Four times a day (QID) | RESPIRATORY_TRACT | 2 refills | Status: DC | PRN

## 2021-02-09 NOTE — ED Provider Notes (Signed)
[place]   MRN: [id number] DOB: 12-Sep-1960  Subjective:   Martin [name] is a 60 y.o. male presenting for 4 to 5-week history of persistent productive cough with intermittent wheezing and shortness of breath.  Patient is a heavy smoker is greater than 50-pack-year smoking.  Has not been diagnosed with COPD.  Denies fever, body aches, chest pain.  He is not opposed to COVID testing.  No current facility-administered medications for this encounter.  Current Outpatient Medications:  .  albuterol (VENTOLIN HFA) 108 (90 Base) MCG/ACT inhaler, Inhale 2 puffs into the lungs every 6 (six) hours as needed for wheezing or shortness of breath., Disp: 8 g, Rfl: 2 .  EPINEPHrine (EPIPEN 2-PAK) 0.3 mg/0.3 mL IJ SOAJ injection, Inject 0.3 mLs (0.3 mg total) into the muscle as needed for anaphylaxis., Disp: 1 each, Rfl: 0 .  omeprazole (PRILOSEC) 20 MG capsule, Take 1 capsule (20 mg total) by mouth 2 (two) times daily before a meal for 15 days., Disp: 30 capsule, Rfl: 0 .  ondansetron (ZOFRAN ODT) 4 MG disintegrating tablet, Take 1 tablet (4 mg total) by mouth every 8 (eight) hours as needed for nausea or vomiting., Disp: 20 tablet, Rfl: 0 .  tamsulosin (FLOMAX) 0.4 MG CAPS capsule, Take 1 capsule (0.4 mg total) by mouth at bedtime., Disp: 30 capsule, Rfl: 2   Allergies  Allergen Reactions  . Yellow Jacket Venom [Bee Venom] Anaphylaxis  . Aspirin Swelling  . Codeine Nausea Only  . Morphine And Related Itching    Past Medical History:  Diagnosis Date  . Allergy 2 2013  . 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 Anxiety   . Arthritis    DDD lumbar, arthritis knees  . DJD (degenerative joint disease) of lumbar spine   . GERD (gastroesophageal reflux disease)   . Hemorrhoid    internal  . History of bowel resection   . Hypertension    EKG 12/12 EPIC   no  PCP- states increased lately but hasnt been diagnosed formally  . Inguinal hernia   .  Pneumonia    as child, cough at present with no fever  . Pneumonia    as child, cough at present with no fever   . Radiation 11/19/11-01/08/12   5040 cGy 28 fx Pelvis and inguinal area     Past Surgical History:  Procedure Laterality Date  . APPENDECTOMY     age 60 or 59  . EXAMINATION UNDER ANESTHESIA  10/14/2011   Procedure: EXAM UNDER ANESTHESIA;  Surgeon: Earnstine Regal, MD;  Location: [facility];  Service: General;  Laterality: N/A;  exam under anethesia, Excision of mass anal canal, 1.5cm  . INGUINAL HERNIA REPAIR  05/11/2012   Procedure: HERNIA REPAIR INGUINAL ADULT;  Surgeon: Earnstine Regal, MD;  Location: [facility];  Service: General;  Laterality: Left;  left inguinal hernia repair with mesh  . surgical pathology   10/14/2011   squamous cell ca of anus  . transanal excision  10/14/2011   Dr.Todd Gerkin    Family History  Problem Relation Age of Onset  . Colon cancer Father   . Asthma Father   . Cancer Father        prostate  . Hypertension Father   . Asthma Mother   . Hypertension Mother   . Stomach cancer Neg Hx   . Esophageal cancer Neg Hx     Social History  Tobacco Use  . Smoking status: Current Every Day Smoker    Packs/day: 1.25    Years: 47.00    Pack years: 58.75    Types: Cigarettes  . Smokeless tobacco: Never Used  . Tobacco comment: Cutting back>> Quit Smart card supplied  Substance Use Topics  . Alcohol use: Not Currently    Alcohol/week: 6.0 standard drinks    Types: 6 Cans of beer per week    Comment: weekend    6pk weekend, beer   . Drug use: Not Currently    Types: Marijuana, Cocaine    Comment: marijuana in past, occasionally now-last use 3-4 weeks ago    ROS   Objective:   Vitals: BP (!) 172/64 (BP Location: Right Arm)   Pulse 73   Temp 97.8 F (36.6 C) (Oral)   Resp 16   SpO2 94%   Physical Exam Constitutional:      General: He is not in acute distress.    Appearance: Normal appearance. He is well-developed. He is not  ill-appearing, toxic-appearing or diaphoretic.  HENT:     Head: Normocephalic and atraumatic.     Right Ear: External ear normal.     Left Ear: External ear normal.     Nose: Nose normal.     Mouth/Throat:     Mouth: Mucous membranes are moist.     Pharynx: Oropharynx is clear.  Eyes:     General: No scleral icterus.    Extraocular Movements: Extraocular movements intact.     Pupils: Pupils are equal, round, and reactive to light.  Cardiovascular:     Rate and Rhythm: Normal rate and regular rhythm.     Heart sounds: Normal heart sounds. No murmur heard. No friction rub. No gallop.   Pulmonary:     Effort: Pulmonary effort is normal. No respiratory distress.     Breath sounds: No stridor. Wheezing and rhonchi present. No rales.  Neurological:     Mental Status: He is alert and oriented to person, place, and time.  Psychiatric:        Mood and Affect: Mood normal.        Behavior: Behavior normal.        Thought Content: Thought content normal.     DG Chest 2 View  Result Date: 02/09/2021 CLINICAL DATA:  59 year old male with persistent cough. EXAM: CHEST - 2 VIEW COMPARISON:  Chest radiographs 08/07/2020 and earlier. FINDINGS: Normal cardiac size and mediastinal contours. Lung volumes are stable and within normal limits. Coarse bilateral pulmonary interstitial opacity is chronic and progressed since 2016 but not significantly changed since last year. No pneumothorax, pleural effusion or acute pulmonary opacity. No acute osseous abnormality identified. Negative visible bowel gas pattern. IMPRESSION: Stable chronic interstitial lung changes. No acute cardiopulmonary abnormality. Electronically Signed   By: Genevie Ann M.D.   On: 02/09/2021 10:43   Assessment and Plan :   PDMP not reviewed this encounter.  1. Productive cough   2. Encounter for screening for COVID-19   3. Smoker     Suspect his cough is likely related to his smoking.  COVID-19 testing is pending.  Will use oral  prednisone course, refilled his albuterol inhaler.  Emphasized need for follow-up with his PCP and encourage patient to consider smoking cessation. Counseled patient on potential for adverse effects with medications prescribed/recommended today, ER and return-to-clinic precautions discussed, patient verbalized understanding.    Jaynee Eagles, [place] 02/09/21 1208

## 2021-02-09 NOTE — ED Triage Notes (Signed)
Pt said he has been having cough for quite some time. Coughing up phlegm, smoker, says he is out of his inhaler and has increased SOB. No fever, no congestion.

## 2021-02-10 LAB — NOVEL CORONAVIRUS, NAA: SARS-CoV-2, NAA: NOT DETECTED

## 2021-02-10 LAB — SARS-COV-2, NAA 2 DAY TAT

## 2021-02-12 ENCOUNTER — Ambulatory Visit (INDEPENDENT_AMBULATORY_CARE_PROVIDER_SITE_OTHER): Payer: Medicare Other | Admitting: Orthopaedic Surgery

## 2021-02-12 ENCOUNTER — Encounter: Payer: Self-pay | Admitting: Orthopaedic Surgery

## 2021-02-12 DIAGNOSIS — M25551 Pain in right hip: Secondary | ICD-10-CM | POA: Diagnosis not present

## 2021-02-12 NOTE — Progress Notes (Signed)
Office Visit Note   Patient: Martin [name]           Date of Birth: [birth year]           MRN: [id number] Visit Date: 02/12/2021              Requested by: No referring provider defined for this encounter. PCP: Patient, No Pcp Per (Inactive)   Assessment & Plan: Visit Diagnoses:  1. Pain in right hip     Plan: I reviewed the lumbar spine MRI which shows multilevel broad-based bulging disks with facet disease at the lower levels.  There is moderate lateral recess stenosis.  Impression is the right hip pain is not coming from the hip joint but rather from his lumbar spine.  I strongly encouraged him to go to physical therapy for core strengthening and flexibility as he is likely deconditioned and weak from his hospitalization.  I also offered a referral to one of our spine specialists if he was interested.  Otherwise we will see him back as needed.  Follow-Up Instructions: Return if symptoms worsen or fail to improve.   Orders:  Orders Placed This Encounter  Procedures  . Ambulatory referral to Physical Therapy   No orders of the defined types were placed in this encounter.     Procedures: No procedures performed   Clinical Data: No additional findings.   Subjective: Chief Complaint  Patient presents with  . Right Hip - Pain    Arsal is a 60 year old gentleman father of Rhett Bannister who I did a ACL reconstruction on who comes in for evaluation of chronic right hip pain.  He is a referral from Dr. Kathyrn Sheriff.  He has had chronic low back problems and right buttock pain that radiates down into both legs with increased walking and standing.  Denies any groin pain.  He has had an MRI and also had epidural steroid injections without much relief.  Denies any bowel or bladder dysfunction.  He did have brief hospitalization for bowel problems which was complicated by infection.   Review of Systems  Constitutional: Negative.   All other systems reviewed and are  negative.    Objective: Vital Signs: There were no vitals taken for this visit.  Physical Exam Vitals and nursing note reviewed.  Constitutional:      Appearance: He is well-developed.  HENT:     Head: Normocephalic and atraumatic.  Eyes:     Pupils: Pupils are equal, round, and reactive to light.  Pulmonary:     Effort: Pulmonary effort is normal.  Abdominal:     Palpations: Abdomen is soft.  Musculoskeletal:        General: Normal range of motion.     Cervical back: Neck supple.  Skin:    General: Skin is warm.  Neurological:     Mental Status: He is alert and oriented to person, place, and time.  Psychiatric:        Behavior: Behavior normal.        Thought Content: Thought content normal.        Judgment: Judgment normal.     Ortho Exam Right hip examination is unremarkable.  Negative sciatic tension signs. Specialty Comments:  No specialty comments available.  Imaging: No results found.   PMFS History: Patient Active Problem List   Diagnosis Date Noted  . Partial small bowel obstruction ([place]) 03/10/2020  . Small bowel obstruction due to adhesions ([place]) 03/08/2020  . Enteritis 10/15/2019  . Abnormal liver  function 10/15/2019  . Leukocytosis 10/15/2019  . Nausea & vomiting 10/15/2019  . GAD (generalized anxiety disorder) 05/18/2017  . 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 Constipation 04/11/2017  . Bee sting 04/05/2017  . Diverticulosis 11/29/2014  . Healthcare maintenance 11/06/2014  . Cough 10/05/2014  . Insomnia 09/13/2014  . Erectile dysfunction 11/30/2013  . Chronic pain syndrome 11/30/2013  . Depression 11/30/2013  . GERD (gastroesophageal reflux disease)   . Hypertension   . Anxiety   . Arthritis   . DJD (degenerative joint disease) of lumbar spine   . Radiation   . Night sweats 08/12/2012  . Tobacco abuse counseling 05/26/2012  . 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 B12 nutritional deficiency 03/19/2012  . Folate deficiency 03/19/2012  .  Inguinal hernia 11/24/2011  . Cancer ([place]) 10/14/2011  . Internal hemorrhoid 09/11/2011  . Anal cancer ([place]) 10/09/2010   Past Medical History:  Diagnosis Date  . Allergy 2 2013  . 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 Anxiety   . Arthritis    DDD lumbar, arthritis knees  . DJD (degenerative joint disease) of lumbar spine   . GERD (gastroesophageal reflux disease)   . Hemorrhoid    internal  . History of bowel resection   . Hypertension    EKG 12/12 EPIC   no  PCP- states increased lately but hasnt been diagnosed formally  . Inguinal hernia   . Pneumonia    as child, cough at present with no fever  . Pneumonia    as child, cough at present with no fever   . Radiation 11/19/11-01/08/12   5040 cGy 28 fx Pelvis and inguinal area    Family History  Problem Relation Age of Onset  . Colon cancer Father   . Asthma Father   . Cancer Father        prostate  . Hypertension Father   . Asthma Mother   . Hypertension Mother   . Stomach cancer Neg Hx   . Esophageal cancer Neg Hx     Past Surgical History:  Procedure Laterality Date  . APPENDECTOMY     age 33 or 74  . EXAMINATION UNDER ANESTHESIA  10/14/2011   Procedure: EXAM UNDER ANESTHESIA;  Surgeon: Earnstine Regal, MD;  Location: [facility];  Service: General;  Laterality: N/A;  exam under anethesia, Excision of mass anal canal, 1.5cm  . INGUINAL HERNIA REPAIR  05/11/2012   Procedure: HERNIA REPAIR INGUINAL ADULT;  Surgeon: Earnstine Regal, MD;  Location: [facility];  Service: General;  Laterality: Left;  left inguinal hernia repair with mesh  . surgical pathology   10/14/2011   squamous cell ca of anus  . transanal excision  10/14/2011   Dr.Todd Gerkin   Social History   Occupational History  . Occupation: unemployed  Tobacco Use  . Smoking status: Current Every Day Smoker    Packs/day: 1.25    Years: 47.00    Pack years: 58.75    Types: Cigarettes  . Smokeless tobacco: Never Used   . Tobacco comment: Cutting back>> Quit Smart card supplied  Substance and Sexual Activity  . Alcohol use: Not Currently    Alcohol/week: 6.0 standard drinks    Types: 6 Cans of beer per week    Comment: weekend    6pk weekend, beer   . Drug use: Not Currently    Types: Marijuana, Cocaine    Comment: marijuana in past, occasionally now-last  use 3-4 weeks ago  . Sexual activity: Yes    Birth control/protection: Condom

## 2021-02-25 ENCOUNTER — Other Ambulatory Visit: Payer: Self-pay

## 2021-02-25 ENCOUNTER — Ambulatory Visit
Admission: EM | Admit: 2021-02-25 | Discharge: 2021-02-25 | Disposition: A | Discharge status: Home / Self Care | Payer: Medicare Other | Attending: Emergency Medicine | Admitting: Emergency Medicine

## 2021-02-25 DIAGNOSIS — R059 Cough, unspecified: Secondary | ICD-10-CM | POA: Diagnosis not present

## 2021-02-25 DIAGNOSIS — J22 Unspecified acute lower respiratory infection: Secondary | ICD-10-CM

## 2021-02-25 MED ORDER — DOXYCYCLINE HYCLATE 100 MG PO CAPS: 100.0000 mg | ORAL_CAPSULE | Freq: Two times a day (BID) | ORAL | 0 refills | Status: AC

## 2021-02-25 MED ORDER — BENZONATATE 200 MG PO CAPS: 200.0000 mg | ORAL_CAPSULE | Freq: Three times a day (TID) | ORAL | 0 refills | Status: AC | PRN

## 2021-02-25 MED ORDER — HYDROCODONE BIT-HOMATROP MBR 5-1.5 MG/5ML PO SOLN: 5.0000 mL | Freq: Every evening | ORAL | 0 refills | Status: DC | PRN

## 2021-02-25 MED ORDER — DM-GUAIFENESIN ER 30-600 MG PO TB12: 1.0000 | ORAL_TABLET | Freq: Two times a day (BID) | ORAL | 0 refills | Status: DC

## 2021-02-25 NOTE — ED Provider Notes (Signed)
[name]    CSN: [id number] Arrival date & time: 02/25/21  1616      History   Chief Complaint Chief Complaint  Patient presents with   Cough    HPI Martin [name] is a 61 y.o. male history of hypertension, GERD, cancer, presenting today for evaluation of persistent cough.  Patient has had cough for over 1 week.  Was most recently seen here approximately 2 weeks ago and had negative chest x-ray.  Was given prednisone and Phenergan DM.  Wheezing has improved, mucus is slightly improved as well, but overall symptoms are still persistent.  Reports nighttime cough keeping him up.  Denies any fevers.  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    Anxiety    Arthritis    DDD lumbar, arthritis knees   DJD (degenerative joint disease) of lumbar spine    GERD (gastroesophageal reflux disease)    Hemorrhoid    internal   History of bowel resection    Hypertension    EKG 12/12 EPIC   no  PCP- states increased lately but hasnt been diagnosed formally   Inguinal hernia    Pneumonia    as child, cough at present with no fever   Pneumonia    as child, cough at present with no fever    Radiation 11/19/11-01/08/12   5040 cGy 28 fx Pelvis and inguinal area    Patient Active Problem List   Diagnosis Date Noted   Partial small bowel obstruction ([place]) 03/10/2020   Small bowel obstruction due to adhesions ([place]) 03/08/2020   Enteritis 10/15/2019   Abnormal liver function 10/15/2019   Leukocytosis 10/15/2019   Nausea & vomiting 10/15/2019   GAD (generalized anxiety disorder) 05/18/2017   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   Constipation 04/11/2017   Bee sting 04/05/2017   Diverticulosis 11/29/2014   Healthcare maintenance 11/06/2014   Cough 10/05/2014   Insomnia 09/13/2014   Erectile dysfunction 11/30/2013   Chronic pain syndrome 11/30/2013   Depression 11/30/2013   GERD  (gastroesophageal reflux disease)    Hypertension    Anxiety    Arthritis    DJD (degenerative joint disease) of lumbar spine    Radiation    Night sweats 08/12/2012   Tobacco abuse counseling 05/26/2012   Lymphocytic colitis 05/03/2012   B12 nutritional deficiency 03/19/2012   Folate deficiency 03/19/2012   Inguinal hernia 11/24/2011   Cancer ([place]) 10/14/2011   Internal hemorrhoid 09/11/2011   Anal cancer ([place]) 10/09/2010    Past Surgical History:  Procedure Laterality Date   APPENDECTOMY     age 33 or 59   EXAMINATION UNDER ANESTHESIA  10/14/2011   Procedure: EXAM UNDER ANESTHESIA;  Surgeon: Earnstine Regal, MD;  Location: [facility];  Service: General;  Laterality: N/A;  exam under anethesia, Excision of mass anal canal, 1.5cm   INGUINAL HERNIA REPAIR  05/11/2012   Procedure: HERNIA REPAIR INGUINAL ADULT;  Surgeon: Earnstine Regal, MD;  Location: [facility];  Service: General;  Laterality: Left;  left inguinal hernia repair with mesh   surgical pathology   10/14/2011   squamous cell ca of anus   transanal excision  10/14/2011   Dr.Todd Gerkin       Home Medications    Prior to Admission medications   Medication Sig Start Date End Date Taking? Authorizing Provider  benzonatate (TESSALON) 200  MG capsule Take 1 capsule (200 mg total) by mouth 3 (three) times daily as needed for up to 7 days for cough. 02/25/21 03/04/21 Yes [name] Trowbridge C, PA-C  dextromethorphan-guaiFENesin (MUCINEX DM) 30-600 MG 12hr tablet Take 1 tablet by mouth 2 (two) times daily. 02/25/21  Yes Llewyn Heap C, PA-C  doxycycline (VIBRAMYCIN) 100 MG capsule Take 1 capsule (100 mg total) by mouth 2 (two) times daily for 7 days. 02/25/21 03/04/21 Yes Mohd Clemons C, PA-C  HYDROcodone bit-homatropine (HYCODAN) 5-1.5 MG/5ML syrup Take 5 mLs by mouth at bedtime as needed for cough. 02/25/21  Yes Cletus Paris C, PA-C  albuterol (VENTOLIN HFA) 108 (90 Base) MCG/ACT inhaler Inhale 2 puffs into the lungs every  6 (six) hours as needed for wheezing or shortness of breath. 02/09/21   Jaynee Eagles, PA-C  EPINEPHrine (EPIPEN 2-PAK) 0.3 mg/0.3 mL IJ SOAJ injection Inject 0.3 mLs (0.3 mg total) into the muscle as needed for anaphylaxis. 02/23/20   Long, Wonda Olds, MD  omeprazole (PRILOSEC) 20 MG capsule Take 1 capsule (20 mg total) by mouth 2 (two) times daily before a meal for 15 days. 09/26/20 10/11/20  Brittny Spangle C, PA-C  tamsulosin (FLOMAX) 0.4 MG CAPS capsule Take 1 capsule (0.4 mg total) by mouth at bedtime. 10/09/20   Gery Pray, MD  sucralfate (CARAFATE) 1 GM/10ML suspension Take 10 mLs (1 g total) by mouth 4 (four) times daily -  with meals and at bedtime. Patient not taking: Reported on 11/28/2019 10/21/19 06/13/20  Randal Buba, April, MD    Family History Family History  Problem Relation Age of Onset   Colon cancer Father    Asthma Father    Cancer Father        prostate   Hypertension Father    Asthma Mother    Hypertension Mother    Stomach cancer Neg Hx    Esophageal cancer Neg Hx     Social History Social History   Tobacco Use   Smoking status: Every Day    Packs/day: 1.25    Years: 47.00    Pack years: 58.75    Types: Cigarettes   Smokeless tobacco: Never   Tobacco comments:    Cutting back>> Quit Smart card supplied  Substance Use Topics   Alcohol use: Not Currently    Alcohol/week: 6.0 standard drinks    Types: 6 Cans of beer per week    Comment: weekend    6pk weekend, beer    Drug use: Not Currently    Types: Marijuana, Cocaine    Comment: marijuana in past, occasionally now-last use 3-4 weeks ago     Allergies   Yellow jacket venom [bee venom], Aspirin, Codeine, and Morphine and related   Review of Systems Review of Systems  Constitutional:  Negative for activity change, appetite change, chills, fatigue and fever.  HENT:  Positive for congestion and rhinorrhea. Negative for ear pain, sinus pressure, sore throat and trouble swallowing.   Eyes:  Negative for  discharge and redness.  Respiratory:  Positive for cough. Negative for chest tightness and shortness of breath.   Cardiovascular:  Negative for chest pain.  Gastrointestinal:  Negative for abdominal pain, diarrhea, nausea and vomiting.  Musculoskeletal:  Negative for myalgias.  Skin:  Negative for rash.  Neurological:  Negative for dizziness, light-headedness and headaches.    Physical Exam Triage Vital Signs ED Triage Vitals  Enc Vitals Group     BP 02/25/21 1853 (!) 165/70     Pulse Rate 02/25/21  1853 76     Resp 02/25/21 1853 18     Temp 02/25/21 1853 98.3 F (36.8 C)     Temp Source 02/25/21 1853 Oral     SpO2 02/25/21 1853 97 %     Weight --      Height --      Head Circumference --      Peak Flow --      Pain Score 02/25/21 1855 7     Pain Loc --      Pain Edu? --      Excl. in [place]? --    No data found.  Updated Vital Signs BP (!) 165/70 (BP Location: Left Arm)   Pulse 76   Temp 98.3 F (36.8 C) (Oral)   Resp 18   SpO2 97%   Visual Acuity Right Eye Distance:   Left Eye Distance:   Bilateral Distance:    Right Eye Near:   Left Eye Near:    Bilateral Near:     Physical Exam Vitals and nursing note reviewed.  Constitutional:      Appearance: He is well-developed.     Comments: No acute distress  HENT:     Head: Normocephalic and atraumatic.     Ears:     Comments: Bilateral ears without tenderness to palpation of external auricle, tragus and mastoid, EAC's without erythema or swelling, TM's with good bony landmarks and cone of light. Non erythematous.      Nose: Nose normal.     Mouth/Throat:     Comments: Oral mucosa pink and moist, no tonsillar enlargement or exudate. Posterior pharynx patent and nonerythematous, no uvula deviation or swelling. Normal phonation.  Eyes:     Conjunctiva/sclera: Conjunctivae normal.  Cardiovascular:     Rate and Rhythm: Normal rate.  Pulmonary:     Effort: Pulmonary effort is normal. No respiratory distress.      Comments: Breathing comfortably at rest, CTABL, no wheezing, rales or other adventitious sounds auscultated  Abdominal:     General: There is no distension.  Musculoskeletal:        General: Normal range of motion.     Cervical back: Neck supple.  Skin:    General: Skin is warm and dry.  Neurological:     Mental Status: He is alert and oriented to person, place, and time.     UC Treatments / Results  Labs (all labs ordered are listed, but only abnormal results are displayed) Labs Reviewed - No data to display  EKG   Radiology No results found.  Procedures Procedures (including critical care time)  Medications Ordered in UC Medications - No data to display  Initial Impression / Assessment and Plan / UC Course  I have reviewed the triage vital signs and the nursing notes.  Pertinent labs & imaging results that were available during my care of the patient were reviewed by me and considered in my medical decision making (see chart for details).     Lower respiratory infection/cough-symptoms times weeks, has not taken antibiotics, will place on doxycycline, deferring further steroids, no further wheezing, Tessalon and Mucinex during the day, Hycodan at bedtime, continue to monitor,Discussed strict return precautions. Patient verbalized understanding and is agreeable with plan.  Final Clinical Impressions(s) / UC Diagnoses   Final diagnoses:  Lower respiratory infection (e.g., bronchitis, pneumonia, pneumonitis, pulmonitis)  Cough     Discharge Instructions      Doxycycline twice daily for 1 week Mucinex DM twice daily as  needed for cough/congestion Supplement with Tessalon/benzonatate every 8 hours for cough during the day Use Hycodan for cough at bedtime Continue inhalers Follow-up if not improving or worsening     ED Prescriptions     Medication Sig Dispense Auth. Provider   doxycycline (VIBRAMYCIN) 100 MG capsule Take 1 capsule (100 mg total) by mouth 2  (two) times daily for 7 days. 14 capsule Deepti Gunawan C, PA-C   benzonatate (TESSALON) 200 MG capsule Take 1 capsule (200 mg total) by mouth 3 (three) times daily as needed for up to 7 days for cough. 28 capsule Senai Ramnath C, PA-C   HYDROcodone bit-homatropine (HYCODAN) 5-1.5 MG/5ML syrup Take 5 mLs by mouth at bedtime as needed for cough. 75 mL Ancil Dewan C, PA-C   dextromethorphan-guaiFENesin (MUCINEX DM) 30-600 MG 12hr tablet Take 1 tablet by mouth 2 (two) times daily. 16 tablet Marvelous Wool[place], [place] C, PA-C      I have reviewed the PDMP during this encounter.   Janith Lima, [place] 02/25/21 1943

## 2021-02-25 NOTE — ED Triage Notes (Signed)
Pt was seen at the ED on 6/4 for cough and hoarseness. Pt reports that the meds he was prescribed did not help much. Cough is interfering with his sleep.

## 2021-02-25 NOTE — Discharge Instructions (Addendum)
Doxycycline twice daily for 1 week Mucinex DM twice daily as needed for cough/congestion Supplement with Tessalon/benzonatate every 8 hours for cough during the day Use Hycodan for cough at bedtime Continue inhalers Follow-up if not improving or worsening

## 2021-02-26 ENCOUNTER — Ambulatory Visit: Payer: Medicare Other | Attending: Orthopaedic Surgery

## 2021-02-26 ENCOUNTER — Other Ambulatory Visit: Payer: Self-pay

## 2021-02-26 DIAGNOSIS — M25551 Pain in right hip: Secondary | ICD-10-CM | POA: Diagnosis present

## 2021-02-26 DIAGNOSIS — M5442 Lumbago with sciatica, left side: Secondary | ICD-10-CM | POA: Insufficient documentation

## 2021-02-26 DIAGNOSIS — R262 Difficulty in walking, not elsewhere classified: Secondary | ICD-10-CM | POA: Diagnosis present

## 2021-02-26 DIAGNOSIS — M5441 Lumbago with sciatica, right side: Secondary | ICD-10-CM | POA: Insufficient documentation

## 2021-02-26 DIAGNOSIS — G8929 Other chronic pain: Secondary | ICD-10-CM | POA: Diagnosis present

## 2021-02-26 DIAGNOSIS — M6281 Muscle weakness (generalized): Secondary | ICD-10-CM | POA: Diagnosis present

## 2021-02-26 DIAGNOSIS — M25552 Pain in left hip: Secondary | ICD-10-CM | POA: Insufficient documentation

## 2021-02-26 NOTE — Therapy (Signed)
[place], [place], [address] Phone: [phone]   Fax:  [phone]  Physical Therapy Treatment  Patient Details  Name: Martin [name] MRN: [id number] Date of Birth: [birth year] Referring Provider (PT): Leandrew Koyanagi, MD   Encounter Date: 02/26/2021   PT End of Session - 02/26/21 0950     Visit Number 1    Number of Visits 13    Date for PT Re-Evaluation 04/20/21    Authorization Type MEDICARE PART A AND B    Progress Note Due on Visit 10    PT Start Time 0848    PT Stop Time 0936    PT Time Calculation (min) 48 min    Activity Tolerance Patient tolerated treatment well    Behavior During Therapy [facility] for tasks assessed/performed             Past Medical History:  Diagnosis Date   Allergy 2 2013   Anal cancer ([place]) 10/14/11   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    Anxiety    Arthritis    DDD lumbar, arthritis knees   DJD (degenerative joint disease) of lumbar spine    GERD (gastroesophageal reflux disease)    Hemorrhoid    internal   History of bowel resection    Hypertension    EKG 12/12 EPIC   no  PCP- states increased lately but hasnt been diagnosed formally   Inguinal hernia    Pneumonia    as child, cough at present with no fever   Pneumonia    as child, cough at present with no fever    Radiation 11/19/11-01/08/12   5040 cGy 28 fx Pelvis and inguinal area    Past Surgical History:  Procedure Laterality Date   APPENDECTOMY     age 60 or 89   EXAMINATION UNDER ANESTHESIA  10/14/2011   Procedure: EXAM UNDER ANESTHESIA;  Surgeon: Earnstine Regal, MD;  Location: [facility];  Service: General;  Laterality: N/A;  exam under anethesia, Excision of mass anal canal, 1.5cm   INGUINAL HERNIA REPAIR  05/11/2012   Procedure: HERNIA REPAIR INGUINAL ADULT;  Surgeon: Earnstine Regal, MD;  Location: [facility];  Service: General;  Laterality: Left;  left inguinal hernia repair with mesh    surgical pathology   10/14/2011   squamous cell ca of anus   transanal excision  10/14/2011   Dr.Todd Gerkin    There were no vitals filed for this visit.   Subjective Assessment - 02/26/21 0852     Subjective Pt reports back, hip and leg pain for approx 1 year and it has been getting progressively worse. Pt reports being in the hospital several years ago with an intestial issue required surgery and his body is still weak from this incident.    Limitations House hold activities;Walking;Standing    How long can you sit comfortably? 30 mins    How long can you stand comfortably? 15 mins    How long can you walk comfortably? 5  mins    Diagnostic tests MRI mentioned in Dr. Erlinda Hong note. indicates more of an issue with degenerative changes of the lumbar spine    Patient Stated Goals To get out of pain and get my strength back    Currently in Pain? Yes    Pain Score 10-Worst pain ever   0-10 pain range   Pain Location Back    Pain Orientation Right;Left;Posterior  Pain Descriptors / Indicators Aching;Sharp;Stabbing    Pain Type Chronic pain    Pain Radiating Towards both LEs    Pain Onset More than a month ago    Pain Frequency Intermittent    Aggravating Factors  Prolonged walking and standing. Lying in certain positions    Pain Relieving Factors Sitting and lying on side helps temporially                [facility] PT Assessment - 02/26/21 0001       Assessment   Medical Diagnosis Pain in right hip    Referring Provider (PT) Leandrew Koyanagi, MD    Onset Date/Surgical Date --   approx 1 year   Hand Dominance Right    Prior Therapy No      Precautions   Precautions None      Restrictions   Weight Bearing Restrictions No      Balance Screen   Has the patient fallen in the past 6 months No      [place] residence    Living Arrangements Children    Type of [place] to enter    Entrance Stairs-Number of Steps 1     Entrance Stairs-Rails Right    [place] One level      Prior Function   Level of Independence Independent    Vocation On disability    Leisure Light yard work      Cognition   Overall Cognitive Status Within Functional Limits for tasks assessed      Observation/Other Assessments   Focus on Therapeutic Outcomes (FOTO)  45% functional ability, 56% predicted      Sensation   Light Touch Appears Intact      Posture/Postural Control   Posture/Postural Control Postural limitations    Postural Limitations Forward head;Rounded Shoulders;Increased thoracic kyphosis;Anterior pelvic tilt      Deep Tendon Reflexes   DTR Assessment Site Patella;Achilles    Patella DTR 2+    Achilles DTR 2+      ROM / Strength   AROM / PROM / Strength AROM;Strength      AROM   Overall AROM Comments No specific movement significantly increased pt's low back pain, but cummulative movements increased LBP.    AROM Assessment Site Lumbar    Lumbar Flexion WFLs    Lumbar Extension WFLs    Lumbar - Right Side Bend WFLs    Lumbar - Left Side Bend WFLs    Lumbar - Right Rotation WFLs    Lumbar - Left Rotation WFLs      Strength   Overall Strength Comments LE myotomal screen is negative. Bilat hip strength was 4-4+/5      Flexibility   Soft Tissue Assessment /Muscle Length yes    Hamstrings L 55, R 57    Piriformis + for increasing L hip pain      Palpation   Palpation comment TTP to the L greater trochanter      Special Tests    Special Tests Lumbar    Lumbar Tests FABER test;Slump Test;Straight Leg Raise      FABER test   findings Positive    Side Right   Lt   Comment for increasing low back pain      Slump test   Findings Negative      Straight Leg Raise   Findings Negative    Side  Right   Lt  Transfers   Sit to Stand 7: Independent      Ambulation/Gait   Ambulation/Gait Yes    Ambulation/Gait Assistance 7: Independent   time walking is limited to 5 to 10 mins with low back  and LE pain progressively greater wrose.                          [facility] Adult PT Treatment/Exercise - 02/26/21 0001       Exercises   Exercises Lumbar      Lumbar Exercises: Stretches   Other Lumbar Stretch Exercise Forward trunk flexion stretch 3x, 10sec      Lumbar Exercises: Supine   Pelvic Tilt 10 reps   3 sec   Bridge 10 reps   partial bridge                   PT Education - 02/26/21 0948     Education Details Eval findings, POC, HEP, sleeping positions c support for comfort    Person(s) Educated Patient    Methods Explanation;Demonstration;Tactile cues;Verbal cues;Handout;Other (comment)    Comprehension Verbalized understanding;Returned demonstration;Verbal cues required;Tactile cues required              PT Short Term Goals - 02/26/21 1741       PT SHORT TERM GOAL #1   Title Pt will be ind in an inital HEP    Baseline started on eval    Status New    Target Date 03/19/21      PT SHORT TERM GOAL #2   Title Pt will voice understanding of measures to assist in the management of low back pain    Status New    Target Date 03/19/21               PT Long Term Goals - 02/26/21 1744       PT LONG TERM GOAL #1   Title Pt will report a decrease in low back and LE pain to 6/10 with walking for 20 mins    Baseline 10/10 with walking 5 mins    Status New    Target Date 04/20/21      PT LONG TERM GOAL #2   Title Increase bilat hip strength to 4+/5 for improved hip stability to decrease pain and improve function    Status New    Target Date 04/20/21      PT LONG TERM GOAL #3   Title pt will return demonstration of proper body mechanics for household activiites to assist in the reduction of low back strain    Status New    Target Date 04/20/21      PT LONG TERM GOAL #4   Title Pt's FOTO score will improve from 45% to 56% functional abilitiy    Baseline 45%    Status New    Target Date 04/20/21      PT LONG TERM GOAL #5    Title Pt will be Ind in a final HEP to maintain or progress achieved LOF    Status New    Target Date 04/20/21                   Plan - 02/26/21 0946     Clinical Impression Statement Pt presents to PT with a chronic hx for low back, bilat hip and bilat LE pain. pt's pain progressively wrosens c prolonged standing and walking, and is relieved with sitting. Pt reports having 10/10 low  back and LE pain just with walking from car to the clinic, but it resolved to 0/10 c sitting. Pt's pelvis is anteriorly rotated, positioning his low back into lordosis in standing. Pt's pain appears to be most symptomatic of degenerative changes with his low back, but articular issues of both hips may be an issue, and the L hip appears symptomatic trochanteric bursitis. Pt will benefit for PT 2w6 QOL.    Personal Factors and Comorbidities Age;Past/Current Experience;Time since onset of injury/illness/exacerbation;Comorbidity 2;Comorbidity 1;Other    Comorbidities arthritis, DDD    Examination-Activity Limitations Stand;Locomotion Level    Examination-Participation Restrictions Other   ADL's   Stability/Clinical Decision Making Stable/Uncomplicated    Clinical Decision Making Low    Rehab Potential Good    PT Frequency 2x / week    PT Duration 6 weeks    PT Treatment/Interventions ADLs/Self Care Home Management;Electrical Stimulation;Iontophoresis 14m/ml Dexamethasone;Moist Heat;Traction;Functional mobility training;Therapeutic activities;Therapeutic exercise;Manual techniques;Patient/family education;Passive range of motion;Dry needling;Taping;Spinal Manipulations    PT Next Visit Plan Assess response to HEP. Review FOTO. Progress ther ex as indicated    PT Home Exercise Plan [email or web address]   Consulted and Agree with Plan of Care Patient             Patient will benefit from skilled therapeutic intervention in order to improve the following deficits and impairments:  Difficulty walking, Pain, Decreased  activity tolerance, Postural dysfunction, Decreased strength  Visit Diagnosis: Pain in right hip  Pain in left hip  Chronic bilateral low back pain with bilateral sciatica  Muscle weakness (generalized)  Difficulty in walking, not elsewhere classified     Problem List Patient Active Problem List   Diagnosis Date Noted   Partial small bowel obstruction ([place] 03/10/2020   Small bowel obstruction due to adhesions ([place] 03/08/2020   Enteritis 10/15/2019   Abnormal liver function 10/15/2019   Leukocytosis 10/15/2019   Nausea & vomiting 10/15/2019   GAD (generalized anxiety disorder) 05/18/2017   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   Constipation 04/11/2017   Bee sting 04/05/2017   Diverticulosis 11/29/2014   Healthcare maintenance 11/06/2014   Cough 10/05/2014   Insomnia 09/13/2014   Erectile dysfunction 11/30/2013   Chronic pain syndrome 11/30/2013   Depression 11/30/2013   GERD (gastroesophageal reflux disease)    Hypertension    Anxiety    Arthritis    DJD (degenerative joint disease) of lumbar spine    Radiation    Night sweats 08/12/2012   Tobacco abuse counseling 05/26/2012   Lymphocytic colitis 05/03/2012   B12 nutritional deficiency 03/19/2012   Folate deficiency 03/19/2012   Inguinal hernia 11/24/2011   Cancer ([place] 10/14/2011   Internal hemorrhoid 09/11/2011   Anal cancer ([place] 10/09/2010   AGar PontoMS, PT 02/26/21 5:54 PM   [facility] [place] [address]Phone: [phone]  Fax:  [phone] Name: HTOD ABRAHAMSENMRN: [id number] of Birth: [birth year]

## 2021-03-04 ENCOUNTER — Telehealth: Payer: Self-pay

## 2021-03-04 ENCOUNTER — Ambulatory Visit: Payer: Medicare Other

## 2021-03-04 NOTE — Telephone Encounter (Signed)
Martin [name] was called and he answered the phone.  He reports putting his appointments in his phone, but it did not remind him.  Martin [name] no showed to his appointment today.  He was reminded of his next appointment on 03/07/21.  Rich Number, PT, DPT, OCS, Crt. DN

## 2021-03-07 ENCOUNTER — Ambulatory Visit: Payer: Medicare Other

## 2021-03-07 ENCOUNTER — Other Ambulatory Visit: Payer: Self-pay

## 2021-03-07 DIAGNOSIS — M6281 Muscle weakness (generalized): Secondary | ICD-10-CM

## 2021-03-07 DIAGNOSIS — M25552 Pain in left hip: Secondary | ICD-10-CM

## 2021-03-07 DIAGNOSIS — R262 Difficulty in walking, not elsewhere classified: Secondary | ICD-10-CM

## 2021-03-07 DIAGNOSIS — M25551 Pain in right hip: Secondary | ICD-10-CM

## 2021-03-07 DIAGNOSIS — G8929 Other chronic pain: Secondary | ICD-10-CM

## 2021-03-07 NOTE — Therapy (Signed)
[place], [place], [address] Phone: [phone]   Fax:  [phone]  Physical Therapy Treatment  Patient Details  Name: Martin [name] MRN: [id number] Date of Birth: 1961/02/02 Referring Provider (PT): Leandrew Koyanagi, MD   Encounter Date: 03/07/2021   PT End of Session - 03/07/21 0716     Visit Number 2    Number of Visits 13    Date for PT Re-Evaluation 04/20/21    Authorization Type MEDICARE PART A AND B    Progress Note Due on Visit 10    PT Start Time 0716    PT Stop Time 0800    PT Time Calculation (min) 44 min    Activity Tolerance Patient tolerated treatment well    Behavior During Therapy [facility] for tasks assessed/performed             Past Medical History:  Diagnosis Date   Allergy 2 2013   Anal cancer ([place]) 10/14/11   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    Anxiety    Arthritis    DDD lumbar, arthritis knees   DJD (degenerative joint disease) of lumbar spine    GERD (gastroesophageal reflux disease)    Hemorrhoid    internal   History of bowel resection    Hypertension    EKG 12/12 EPIC   no  PCP- states increased lately but hasnt been diagnosed formally   Inguinal hernia    Pneumonia    as child, cough at present with no fever   Pneumonia    as child, cough at present with no fever    Radiation 11/19/11-01/08/12   5040 cGy 28 fx Pelvis and inguinal area    Past Surgical History:  Procedure Laterality Date   APPENDECTOMY     age 74 or 80   EXAMINATION UNDER ANESTHESIA  10/14/2011   Procedure: EXAM UNDER ANESTHESIA;  Surgeon: Earnstine Regal, MD;  [facility];  Service: General;  Laterality: N/A;  exam under anethesia, Excision of mass anal canal, 1.5cm   INGUINAL HERNIA REPAIR  05/11/2012   Procedure: HERNIA REPAIR INGUINAL ADULT;  Surgeon: Earnstine Regal, MD;  Location: [facility];  Service: General;  Laterality: Left;  left inguinal hernia repair with mesh    surgical pathology   10/14/2011   squamous cell ca of anus   transanal excision  10/14/2011   Dr.Todd Gerkin    There were no vitals filed for this visit.   Subjective Assessment - 03/07/21 0740     Subjective Pt reports he is still having the same issue c prolonged walking or standing c low back and gluteal pain, 10/10. Pt states the forward trunk stretch is helpful in relievng this pain. Pt denies point tenderness to the GT hip areas.    Diagnostic tests MRI mentioned in Dr. Erlinda Hong note. indicates more of an issue with degenerative changes of the lumbar spine    Patient Stated Goals To get out of pain and get my strength back    Currently in Pain? Yes    Pain Score 10-Worst pain ever    Pain Location Back    Pain Orientation Right;Left;Posterior    Pain Descriptors / Indicators Aching;Sharp    Pain Type Chronic pain    Pain Onset More than a month ago    Pain Frequency Intermittent  [facility] Adult PT Treatment/Exercise - 03/07/21 0001       Lumbar Exercises: Stretches   Active Hamstring Stretch Right;Left;2 reps;20 seconds    Active Hamstring Stretch Limitations seated    Lower Trunk Rotation 5 reps;10 seconds    Piriformis Stretch Right;Left;2 reps;20 seconds    Other Lumbar Stretch Exercise Seated trunk flexion; forward and laterally stretch 2x, 15sec each      Lumbar Exercises: Supine   Pelvic Tilt 20 reps   3 sec   Bridge 20 reps   partial bridge                   PT Education - 03/07/21 0801     Education Details Updated HEP. Education re: spinal stenosis and the machanics of pain    Person(s) Educated Patient    Methods Explanation;Demonstration;Tactile cues;Verbal cues;Handout    Comprehension Verbalized understanding;Returned demonstration;Verbal cues required;Tactile cues required              PT Short Term Goals - 02/26/21 1741       PT SHORT TERM GOAL #1   Title Pt will be ind in an inital HEP     Baseline started on eval    Status New    Target Date 03/19/21      PT SHORT TERM GOAL #2   Title Pt will voice understanding of measures to assist in the management of low back pain    Status New    Target Date 03/19/21               PT Long Term Goals - 02/26/21 1744       PT LONG TERM GOAL #1   Title Pt will report a decrease in low back and LE pain to 6/10 with walking for 20 mins    Baseline 10/10 with walking 5 mins    Status New    Target Date 04/20/21      PT LONG TERM GOAL #2   Title Increase bilat hip strength to 4+/5 for improved hip stability to decrease pain and improve function    Status New    Target Date 04/20/21      PT LONG TERM GOAL #3   Title pt will return demonstration of proper body mechanics for household activiites to assist in the reduction of low back strain    Status New    Target Date 04/20/21      PT LONG TERM GOAL #4   Title Pt's FOTO score will improve from 45% to 56% functional abilitiy    Baseline 45%    Status New    Target Date 04/20/21      PT LONG TERM GOAL #5   Title Pt will be Ind in a final HEP to maintain or progress achieved LOF    Status New    Target Date 04/20/21                   Plan - 03/07/21 0717     Clinical Impression Statement Provided Ed re:low back condition, ie stenosis, and the purpose of PT to assist in the management of this condition per flexibility and strengthening exs and positioning. Pt voiced understanding and returned demonstration for the ther ex. Pt was not tender to direct pressure on the bilat GT/hip areas. Pt completed therex for lumbo/pelvic and LE mobility and strengthening. Pt tolerated rehab today without adverse effects.    Personal Factors and Comorbidities Age;Past/Current Experience;Time since onset of injury/illness/exacerbation;Comorbidity  2;Comorbidity 1;Other    Comorbidities arthritis, DDD    Examination-Activity Limitations Stand;Locomotion Level     Examination-Participation Restrictions Other   ADLs   Stability/Clinical Decision Making Stable/Uncomplicated    Clinical Decision Making Low    Rehab Potential Good    PT Frequency 2x / week    PT Duration 6 weeks    PT Treatment/Interventions ADLs/Self Care Home Management;Electrical Stimulation;Iontophoresis 74m/ml Dexamethasone;Moist Heat;Traction;Functional mobility training;Therapeutic activities;Therapeutic exercise;Manual techniques;Patient/family education;Passive range of motion;Dry needling;Taping;Spinal Manipulations    PT Next Visit Plan Assess response to HEP. Review FOTO. Progress ther ex as indicated    PT Home Exercise Plan [email or web address]   Consulted and Agree with Plan of Care Patient             Patient will benefit from skilled therapeutic intervention in order to improve the following deficits and impairments:  Difficulty walking, Pain, Decreased activity tolerance, Postural dysfunction, Decreased strength  Visit Diagnosis: Pain in right hip  Pain in left hip  Chronic bilateral low back pain with bilateral sciatica  Muscle weakness (generalized)  Difficulty in walking, not elsewhere classified     Problem List Patient Active Problem List   Diagnosis Date Noted   Partial small bowel obstruction ([place] 03/10/2020   Small bowel obstruction due to adhesions ([place] 03/08/2020   Enteritis 10/15/2019   Abnormal liver function 10/15/2019   Leukocytosis 10/15/2019   Nausea & vomiting 10/15/2019   GAD (generalized anxiety disorder) 05/18/2017   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   Constipation 04/11/2017   Bee sting 04/05/2017   Diverticulosis 11/29/2014   Healthcare maintenance 11/06/2014   Cough 10/05/2014   Insomnia 09/13/2014   Erectile dysfunction 11/30/2013   Chronic pain syndrome 11/30/2013   Depression 11/30/2013   GERD (gastroesophageal reflux disease)    Hypertension    Anxiety    Arthritis    DJD (degenerative joint disease) of lumbar spine     Radiation    Night sweats 08/12/2012   Tobacco abuse counseling 05/26/2012   Lymphocytic colitis 05/03/2012   B12 nutritional deficiency 03/19/2012   Folate deficiency 03/19/2012   Inguinal hernia 11/24/2011   Cancer ([place] 10/14/2011   Internal hemorrhoid 09/11/2011   Anal cancer ([place] 10/09/2010    AGar PontoMS, PT 03/07/21 9:14 AM   [facility] [address]Phone: [phone]  Fax:  [phone] Name: Martin ZAJKOWSKIMRN: [id number] of Birth: 510-12-1960

## 2021-03-13 ENCOUNTER — Ambulatory Visit: Payer: Medicare Other

## 2021-03-15 ENCOUNTER — Ambulatory Visit: Payer: Medicare Other

## 2021-03-15 ENCOUNTER — Other Ambulatory Visit: Payer: Self-pay

## 2021-03-15 ENCOUNTER — Ambulatory Visit: Payer: Medicare Other | Attending: Orthopaedic Surgery | Admitting: Physical Therapy

## 2021-03-15 DIAGNOSIS — G8929 Other chronic pain: Secondary | ICD-10-CM | POA: Insufficient documentation

## 2021-03-15 DIAGNOSIS — R262 Difficulty in walking, not elsewhere classified: Secondary | ICD-10-CM | POA: Diagnosis present

## 2021-03-15 DIAGNOSIS — M5442 Lumbago with sciatica, left side: Secondary | ICD-10-CM | POA: Insufficient documentation

## 2021-03-15 DIAGNOSIS — M25551 Pain in right hip: Secondary | ICD-10-CM | POA: Insufficient documentation

## 2021-03-15 DIAGNOSIS — M5441 Lumbago with sciatica, right side: Secondary | ICD-10-CM | POA: Insufficient documentation

## 2021-03-15 DIAGNOSIS — M6281 Muscle weakness (generalized): Secondary | ICD-10-CM | POA: Insufficient documentation

## 2021-03-15 DIAGNOSIS — M25552 Pain in left hip: Secondary | ICD-10-CM | POA: Diagnosis present

## 2021-03-15 NOTE — Therapy (Signed)
[place], [place], [address] Phone: [phone]   Fax:  [phone]  Physical Therapy Treatment  Patient Details  Name: Martin [name] MRN: [id number] Date of Birth: 01-13-1961 Referring Provider (PT): Leandrew Koyanagi, MD   Encounter Date: 03/15/2021   PT End of Session - 03/15/21 0756     Visit Number 3    Number of Visits 13    Date for PT Re-Evaluation 04/20/21    Authorization Type MEDICARE PART A AND B    Progress Note Due on Visit 10    PT Start Time 0715    PT Stop Time 0800    PT Time Calculation (min) 45 min    Activity Tolerance Patient tolerated treatment well    Behavior During Therapy [facility] for tasks assessed/performed             Past Medical History:  Diagnosis Date   Allergy 2 2013   Anal cancer ([place]) 10/14/11   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    Anxiety    Arthritis    DDD lumbar, arthritis knees   DJD (degenerative joint disease) of lumbar spine    GERD (gastroesophageal reflux disease)    Hemorrhoid    internal   History of bowel resection    Hypertension    EKG 12/12 EPIC   no  PCP- states increased lately but hasnt been diagnosed formally   Inguinal hernia    Pneumonia    as child, cough at present with no fever   Pneumonia    as child, cough at present with no fever    Radiation 11/19/11-01/08/12   5040 cGy 28 fx Pelvis and inguinal area    Past Surgical History:  Procedure Laterality Date   APPENDECTOMY     age 60 or 60   EXAMINATION UNDER ANESTHESIA  10/14/2011   Procedure: EXAM UNDER ANESTHESIA;  Surgeon: Earnstine Regal, MD;  Location: [facility];  Service: General;  Laterality: N/A;  exam under anethesia, Excision of mass anal canal, 1.5cm   INGUINAL HERNIA REPAIR  05/11/2012   Procedure: HERNIA REPAIR INGUINAL ADULT;  Surgeon: Earnstine Regal, MD;  Location: [facility];  Service: General;  Laterality: Left;  left inguinal hernia repair with mesh    surgical pathology   10/14/2011   squamous cell ca of anus   transanal excision  10/14/2011   Dr.Todd Gerkin    There were no vitals filed for this visit.   Subjective Assessment - 03/15/21 0719     Subjective Pt reports nothing new or different. Pt notes continued pain in hips and legs. Pt states he's been doing the exercises. Pt reports forward trunk flexion helps the most.    Limitations House hold activities;Walking;Standing    How long can you sit comfortably? 30 mins    How long can you stand comfortably? 15 mins    How long can you walk comfortably? 5  mins    Diagnostic tests MRI mentioned in Dr. Erlinda Hong note. indicates more of an issue with degenerative changes of the lumbar spine    Patient Stated Goals To get out of pain and get my strength back    Currently in Pain? No/denies    Pain Onset More than a month ago  [facility] Adult PT Treatment/Exercise - 03/15/21 0001       Lumbar Exercises: Stretches   Double Knee to Chest Stretch 20 seconds      Lumbar Exercises: Aerobic   Nustep L3 x 5 min UEs/LEs      Lumbar Exercises: Supine   Pelvic Tilt 10 reps    Bridge 10 reps    Bridge with March Compliant;10 reps    Other Supine Lumbar Exercises 90/90 with heel tap 2x10      Lumbar Exercises: Sidelying   Clam Both;20 reps    Clam Limitations green tband      Modalities   Modalities Traction      Traction   Type of Traction Lumbar    Min (lbs) 30    Max (lbs) 70    Time 10                      PT Short Term Goals - 02/26/21 1741       PT SHORT TERM GOAL #1   Title Pt will be ind in an inital HEP    Baseline started on eval    Status New    Target Date 03/19/21      PT SHORT TERM GOAL #2   Title Pt will voice understanding of measures to assist in the management of low back pain    Status New    Target Date 03/19/21               PT Long Term Goals - 02/26/21 1744       PT LONG TERM GOAL #1    Title Pt will report a decrease in low back and LE pain to 6/10 with walking for 20 mins    Baseline 10/10 with walking 5 mins    Status New    Target Date 04/20/21      PT LONG TERM GOAL #2   Title Increase bilat hip strength to 4+/5 for improved hip stability to decrease pain and improve function    Status New    Target Date 04/20/21      PT LONG TERM GOAL #3   Title pt will return demonstration of proper body mechanics for household activiites to assist in the reduction of low back strain    Status New    Target Date 04/20/21      PT LONG TERM GOAL #4   Title Pt's FOTO score will improve from 45% to 56% functional abilitiy    Baseline 45%    Status New    Target Date 04/20/21      PT LONG TERM GOAL #5   Title Pt will be Ind in a final HEP to maintain or progress achieved LOF    Status New    Target Date 04/20/21                   Plan - 03/15/21 0738     Clinical Impression Statement Treatment focused on progressing lumbopelvic strengthening. Tolerated well with no increase in pain but reports hip soreness. Trialed traction today to help address pt's bulging discs.    Personal Factors and Comorbidities Age;Past/Current Experience;Time since onset of injury/illness/exacerbation;Comorbidity 2;Comorbidity 1;Other    Comorbidities arthritis, DDD    Examination-Activity Limitations Stand;Locomotion Level    Examination-Participation Restrictions Other   ADLs   Stability/Clinical Decision Making Stable/Uncomplicated    Rehab Potential Good    PT Frequency 2x / week    PT Duration  6 weeks    PT Treatment/Interventions ADLs/Self Care Home Management;Electrical Stimulation;Iontophoresis 6m/ml Dexamethasone;Moist Heat;Traction;Functional mobility training;Therapeutic activities;Therapeutic exercise;Manual techniques;Patient/family education;Passive range of motion;Dry needling;Taping;Spinal Manipulations    PT Next Visit Plan Assess response to HEP. Review FOTO. Progress  ther ex as indicated    PT Home Exercise Plan [email or web address]   Consulted and Agree with Plan of Care Patient             Patient will benefit from skilled therapeutic intervention in order to improve the following deficits and impairments:  Difficulty walking, Pain, Decreased activity tolerance, Postural dysfunction, Decreased strength  Visit Diagnosis: Pain in right hip  Pain in left hip  Chronic bilateral low back pain with bilateral sciatica  Muscle weakness (generalized)     Problem List Patient Active Problem List   Diagnosis Date Noted   Partial small bowel obstruction ([place] 03/10/2020   Small bowel obstruction due to adhesions ([place] 03/08/2020   Enteritis 10/15/2019   Abnormal liver function 10/15/2019   Leukocytosis 10/15/2019   Nausea & vomiting 10/15/2019   GAD (generalized anxiety disorder) 05/18/2017   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   Constipation 04/11/2017   Bee sting 04/05/2017   Diverticulosis 11/29/2014   Healthcare maintenance 11/06/2014   Cough 10/05/2014   Insomnia 09/13/2014   Erectile dysfunction 11/30/2013   Chronic pain syndrome 11/30/2013   Depression 11/30/2013   GERD (gastroesophageal reflux disease)    Hypertension    Anxiety    Arthritis    DJD (degenerative joint disease) of lumbar spine    Radiation    Night sweats 08/12/2012   Tobacco abuse counseling 05/26/2012   Lymphocytic colitis 05/03/2012   B12 nutritional deficiency 03/19/2012   Folate deficiency 03/19/2012   Inguinal hernia 11/24/2011   Cancer ([place] 10/14/2011   Internal hemorrhoid 09/11/2011   [place] cancer ([place] 10/09/2010    Aftyn Nott April MGordy Levan7/04/2021, 7:58 AM  [facility]GMadrid [address] [address]Phone: [phone]  Fax:  [phone] Name: HCOLIE FUGITTMRN: [id number] of Birth: 5Feb 27, 1962

## 2021-03-20 ENCOUNTER — Ambulatory Visit: Payer: Medicare Other

## 2021-03-20 ENCOUNTER — Telehealth: Payer: Self-pay

## 2021-03-20 NOTE — Telephone Encounter (Signed)
Spoke with pt. He reports he was sick on his stomach this AM and forgot to call in. Reminded to cancel his appts. if not to make them, and of his upcoming appt. on 03/22/21.

## 2021-03-22 ENCOUNTER — Ambulatory Visit: Payer: Medicare Other

## 2021-03-22 ENCOUNTER — Other Ambulatory Visit: Payer: Self-pay

## 2021-03-22 DIAGNOSIS — M25551 Pain in right hip: Secondary | ICD-10-CM

## 2021-03-22 DIAGNOSIS — R262 Difficulty in walking, not elsewhere classified: Secondary | ICD-10-CM

## 2021-03-22 DIAGNOSIS — G8929 Other chronic pain: Secondary | ICD-10-CM

## 2021-03-22 DIAGNOSIS — M6281 Muscle weakness (generalized): Secondary | ICD-10-CM

## 2021-03-22 DIAGNOSIS — M25552 Pain in left hip: Secondary | ICD-10-CM

## 2021-03-22 NOTE — Therapy (Signed)
[place], [place], [address] Phone: [phone]   Fax:  [phone]  Physical Therapy Treatment  Patient Details  Name: Martin [name] MRN: [id number] Date of Birth: December 06, 1960 Referring Provider (PT): Leandrew Koyanagi, MD   Encounter Date: 03/22/2021   PT End of Session - 03/22/21 0804     Visit Number 4    Number of Visits 13    Date for PT Re-Evaluation 04/20/21    Authorization Type MEDICARE PART A AND B    Progress Note Due on Visit 10    PT Start Time 0803    PT Stop Time 2202    PT Time Calculation (min) 41 min    Activity Tolerance Patient tolerated treatment well    Behavior During Therapy [facility] for tasks assessed/performed             Past Medical History:  Diagnosis Date   Allergy 2 2013   Anal cancer ([place]) 10/14/11   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    Anxiety    Arthritis    DDD lumbar, arthritis knees   DJD (degenerative joint disease) of lumbar spine    GERD (gastroesophageal reflux disease)    Hemorrhoid    internal   History of bowel resection    Hypertension    EKG 12/12 EPIC   no  PCP- states increased lately but hasnt been diagnosed formally   Inguinal hernia    Pneumonia    as child, cough at present with no fever   Pneumonia    as child, cough at present with no fever    Radiation 11/19/11-01/08/12   5040 cGy 28 fx Pelvis and inguinal area    Past Surgical History:  Procedure Laterality Date   APPENDECTOMY     age 6 or 31   EXAMINATION UNDER ANESTHESIA  10/14/2011   Procedure: EXAM UNDER ANESTHESIA;  Surgeon: Earnstine Regal, MD;  Location: [facility];  Service: General;  Laterality: N/A;  exam under anethesia, Excision of mass anal canal, 1.5cm   INGUINAL HERNIA REPAIR  05/11/2012   Procedure: HERNIA REPAIR INGUINAL ADULT;  Surgeon: Earnstine Regal, MD;  Location: [facility];  Service: General;  Laterality: Left;  left inguinal hernia repair with mesh    surgical pathology   10/14/2011   squamous cell ca of anus   transanal excision  10/14/2011   Dr.Todd Gerkin    There were no vitals filed for this visit.   Subjective Assessment - 03/22/21 0810     Subjective Pt notes his pain is not improved, but he is able to better manage the pain with his exercise program when the pain develops with prolonged standing or walking. Pain can go to 10/10 with standing/walking and back to 0/10 quickly with rest and stretches    Diagnostic tests MRI mentioned in Dr. Erlinda Hong note. indicates more of an issue with degenerative changes of the lumbar spine    Patient Stated Goals To get out of pain and get my strength back    Currently in Pain? No/denies    Pain Location Back    Pain Orientation Right;Left    Pain Onset More than a month ago    Pain Frequency Intermittent                               [facility] Adult PT Treatment/Exercise - 03/22/21 0001  Exercises   Exercises Lumbar      Lumbar Exercises: Stretches   Lower Trunk Rotation 5 reps;10 seconds    Other Lumbar Stretch Exercise Prone on elbows 3x, 20 sec. prone pressups    Other Lumbar Stretch Exercise Seated trunk flexion; forward and laterally stretch 2x, 15sec each. Open book-L and R 5x, 10 sec      Lumbar Exercises: Aerobic   Nustep L5 x 5 min UEs/LEs      Traction   Type of Traction --    Min (lbs) --    Max (lbs) --    Time --      Manual Therapy   Manual Therapy Joint mobilization    Manual therapy comments Grade 3 unilateral PA glides, bilaterally to T12-L5                    PT Education - 03/22/21 2113     Education Details Updated HEP    Person(s) Educated Patient    Methods Explanation;Demonstration;Tactile cues;Verbal cues;Handout    Comprehension Verbalized understanding;Returned demonstration;Verbal cues required;Tactile cues required              PT Short Term Goals - 03/22/21 2123       PT SHORT TERM GOAL #1   Title Pt will  be ind in an inital HEP    Status Achieved    Target Date 03/22/21      PT SHORT TERM GOAL #2   Title Pt will voice understanding of measures to assist in the management of low back pain    Status Achieved               PT Long Term Goals - 02/26/21 1744       PT LONG TERM GOAL #1   Title Pt will report a decrease in low back and LE pain to 6/10 with walking for 20 mins    Baseline 10/10 with walking 5 mins    Status New    Target Date 04/20/21      PT LONG TERM GOAL #2   Title Increase bilat hip strength to 4+/5 for improved hip stability to decrease pain and improve function    Status New    Target Date 04/20/21      PT LONG TERM GOAL #3   Title pt will return demonstration of proper body mechanics for household activiites to assist in the reduction of low back strain    Status New    Target Date 04/20/21      PT LONG TERM GOAL #4   Title Pt's FOTO score will improve from 45% to 56% functional abilitiy    Baseline 45%    Status New    Target Date 04/20/21      PT LONG TERM GOAL #5   Title Pt will be Ind in a final HEP to maintain or progress achieved LOF    Status New    Target Date 04/20/21                   Plan - 03/22/21 0805     Clinical Impression Statement Pt is managing pain better, but overall his low back pain can become significant c prolonged standing and walking. PT today was completed to try and improve extension mobility to see if improvement helps his tolerance to standing and walking. Pt reports some increased soreness with his low back after the session, but it was not significant. Exs were added to  HEP to address extension. Pt is to continue to complete added exs as tolerated.    Personal Factors and Comorbidities Age;Past/Current Experience;Time since onset of injury/illness/exacerbation;Comorbidity 2;Comorbidity 1;Other    Comorbidities arthritis, DDD    Examination-Activity Limitations Stand;Locomotion Level     Examination-Participation Restrictions Other    Stability/Clinical Decision Making Stable/Uncomplicated    Clinical Decision Making Low    Rehab Potential Good    PT Frequency 2x / week    PT Duration 6 weeks    PT Treatment/Interventions ADLs/Self Care Home Management;Electrical Stimulation;Iontophoresis 49m/ml Dexamethasone;Moist Heat;Traction;Functional mobility training;Therapeutic activities;Therapeutic exercise;Manual techniques;Patient/family education;Passive range of motion;Dry needling;Taping;Spinal Manipulations    PT Next Visit Plan Assess response to HEP with trial to improve extension ROM.  Review FOTO. Progress ther ex as indicated    PT Home Exercise Plan [email or web address]   Consulted and Agree with Plan of Care Patient             Patient will benefit from skilled therapeutic intervention in order to improve the following deficits and impairments:  Difficulty walking, Pain, Decreased activity tolerance, Postural dysfunction, Decreased strength  Visit Diagnosis: Pain in right hip  Pain in left hip  Chronic bilateral low back pain with bilateral sciatica  Muscle weakness (generalized)  Difficulty in walking, not elsewhere classified     Problem List Patient Active Problem List   Diagnosis Date Noted   Partial small bowel obstruction ([place] 03/10/2020   Small bowel obstruction due to adhesions ([place] 03/08/2020   Enteritis 10/15/2019   Abnormal liver function 10/15/2019   Leukocytosis 10/15/2019   Nausea & vomiting 10/15/2019   GAD (generalized anxiety disorder) 05/18/2017   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   Constipation 04/11/2017   Bee sting 04/05/2017   Diverticulosis 11/29/2014   Healthcare maintenance 11/06/2014   Cough 10/05/2014   Insomnia 09/13/2014   Erectile dysfunction 11/30/2013   Chronic pain syndrome 11/30/2013   Depression 11/30/2013   GERD (gastroesophageal reflux disease)    Hypertension    Anxiety    Arthritis    DJD (degenerative joint  disease) of lumbar spine    Radiation    Night sweats 08/12/2012   Tobacco abuse counseling 05/26/2012   Lymphocytic colitis 05/03/2012   B12 nutritional deficiency 03/19/2012   Folate deficiency 03/19/2012   Inguinal hernia 11/24/2011   Cancer ([place] 10/14/2011   Internal hemorrhoid 09/11/2011   Anal cancer ([place] 10/09/2010    AGar PontoMS, PT 03/22/21 9:45 PM   C[facility]CWis[facility] Center[address]GEn[address] [address]Phone: [phone]  Fax:  [phone] Name: Martin SCHEIDTMRN: [id number] of Birth: 5Jun 09, 1962

## 2021-03-27 ENCOUNTER — Ambulatory Visit: Payer: Medicare Other

## 2021-03-29 ENCOUNTER — Ambulatory Visit: Payer: Medicare Other

## 2021-03-29 ENCOUNTER — Other Ambulatory Visit: Payer: Self-pay

## 2021-03-29 DIAGNOSIS — M25551 Pain in right hip: Secondary | ICD-10-CM | POA: Diagnosis not present

## 2021-03-29 DIAGNOSIS — G8929 Other chronic pain: Secondary | ICD-10-CM

## 2021-03-29 DIAGNOSIS — R262 Difficulty in walking, not elsewhere classified: Secondary | ICD-10-CM

## 2021-03-29 DIAGNOSIS — M6281 Muscle weakness (generalized): Secondary | ICD-10-CM

## 2021-03-29 DIAGNOSIS — M25552 Pain in left hip: Secondary | ICD-10-CM

## 2021-03-29 DIAGNOSIS — M5442 Lumbago with sciatica, left side: Secondary | ICD-10-CM

## 2021-03-29 NOTE — Therapy (Signed)
[place], [place], [address] Phone: [phone]   Fax:  [phone]  Physical Therapy Treatment  Patient Details  Name: Martin [name] MRN: [id number] Date of Birth: [birth year] Referring Provider (PT): Leandrew Koyanagi, MD   Encounter Date: 03/29/2021   PT End of Session - 03/29/21 0721     Visit Number 5    Number of Visits 13    Date for PT Re-Evaluation 04/20/21    Authorization Type MEDICARE PART A AND B    Progress Note Due on Visit 10    PT Start Time 0715    PT Stop Time 0758    PT Time Calculation (min) 43 min    Activity Tolerance Patient tolerated treatment well    Behavior During Therapy [facility] for tasks assessed/performed             Past Medical History:  Diagnosis Date   Allergy 2 2013   Anal cancer ([place]) 10/14/11   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    Anxiety    Arthritis    DDD lumbar, arthritis knees   DJD (degenerative joint disease) of lumbar spine    GERD (gastroesophageal reflux disease)    Hemorrhoid    internal   History of bowel resection    Hypertension    EKG 12/12 EPIC   no  PCP- states increased lately but hasnt been diagnosed formally   Inguinal hernia    Pneumonia    as child, cough at present with no fever   Pneumonia    as child, cough at present with no fever    Radiation 11/19/11-01/08/12   5040 cGy 28 fx Pelvis and inguinal area    Past Surgical History:  Procedure Laterality Date   APPENDECTOMY     age 19 or 57   EXAMINATION UNDER ANESTHESIA  10/14/2011   Procedure: EXAM UNDER ANESTHESIA;  Surgeon: Earnstine Regal, MD;  Location: [facility];  Service: General;  Laterality: N/A;  exam under anethesia, Excision of mass anal canal, 1.5cm   INGUINAL HERNIA REPAIR  05/11/2012   Procedure: HERNIA REPAIR INGUINAL ADULT;  Surgeon: Earnstine Regal, MD;  Location: [facility];  Service: General;  Laterality: Left;  left inguinal hernia repair with mesh    surgical pathology   10/14/2011   squamous cell ca of anus   transanal excision  10/14/2011   Dr.Todd Gerkin    There were no vitals filed for this visit.   Subjective Assessment - 03/29/21 0719     Subjective Pt reports the extension exs caused his low back to bother him more so he stopped going them a few days ago.    Limitations House hold activities;Walking;Standing    Diagnostic tests MRI mentioned in Dr. Erlinda Hong note. indicates more of an issue with degenerative changes of the lumbar spine    Patient Stated Goals To get out of pain and get my strength back    Currently in Pain? No/denies    Pain Score 0-No pain    Pain Location Back    Pain Orientation Right;Left    Pain Descriptors / Indicators Aching;Sharp    Pain Type Chronic pain    Pain Radiating Towards both LEs    Pain Onset More than a month ago    Pain Frequency Intermittent  [facility] Adult PT Treatment/Exercise - 03/29/21 0001       Lumbar Exercises: Stretches   Lower Trunk Rotation 5 reps;10 seconds    Hip Flexor Stretch Right;Left;2 reps;30 seconds    Other Lumbar Stretch Exercise Open book, L and R, 10x, 5 sec    Other Lumbar Stretch Exercise Seated trunk flexion; forward and laterally stretch 2x, 15sec each. Open book-L and R 5x, 10 sec      Lumbar Exercises: Aerobic   Nustep L5 x 5 min UEs/LEs      Lumbar Exercises: Supine   Pelvic Tilt 15 reps   3 sec   Bridge 15 reps    Bridge Limitations c PPT    Other Supine Lumbar Exercises Clams c blue Tband 15x      Manual Therapy   Manual Therapy Joint mobilization    Manual therapy comments Grade 3 unilateral UPA glides, bilaterally to T12-L5                      PT Short Term Goals - 03/22/21 2123       PT SHORT TERM GOAL #1   Title Pt will be ind in an inital HEP    Status Achieved    Target Date 03/22/21      PT SHORT TERM GOAL #2   Title Pt will voice understanding of measures to assist in  the management of low back pain    Status Achieved               PT Long Term Goals - 02/26/21 1744       PT LONG TERM GOAL #1   Title Pt will report a decrease in low back and LE pain to 6/10 with walking for 20 mins    Baseline 10/10 with walking 5 mins    Status New    Target Date 04/20/21      PT LONG TERM GOAL #2   Title Increase bilat hip strength to 4+/5 for improved hip stability to decrease pain and improve function    Status New    Target Date 04/20/21      PT LONG TERM GOAL #3   Title pt will return demonstration of proper body mechanics for household activiites to assist in the reduction of low back strain    Status New    Target Date 04/20/21      PT LONG TERM GOAL #4   Title Pt's FOTO score will improve from 45% to 56% functional abilitiy    Baseline 45%    Status New    Target Date 04/20/21      PT LONG TERM GOAL #5   Title Pt will be Ind in a final HEP to maintain or progress achieved LOF    Status New    Target Date 04/20/21                   Plan - 03/29/21 0747     Clinical Impression Statement Pt did not tolerate prone exs to improve low back extension. Will continue to address ROM and flexibility per flexion and rotation exs. The Thomas hip flexor stretch was added today. PT also continued to address lumbopelvic/hip strengthening. Addended HEP, pt is to complete flexibility exs daily and strengthening exs every other day. Pt tolerated todays' PT session without adverse effects.    Personal Factors and Comorbidities Age;Past/Current Experience;Time since onset of injury/illness/exacerbation;Comorbidity 2;Comorbidity 1;Other    Comorbidities arthritis, DDD  Examination-Activity Limitations Stand;Locomotion Level    Examination-Participation Restrictions Other   ADLs   Stability/Clinical Decision Making Stable/Uncomplicated    Clinical Decision Making Low    Rehab Potential Good    PT Frequency 2x / week    PT Duration 6 weeks    PT  Treatment/Interventions ADLs/Self Care Home Management;Electrical Stimulation;Iontophoresis 57m/ml Dexamethasone;Moist Heat;Traction;Functional mobility training;Therapeutic activities;Therapeutic exercise;Manual techniques;Patient/family education;Passive range of motion;Dry needling;Taping;Spinal Manipulations    PT Next Visit Plan Progress ther ex as indicated    PT Home Exercise Plan [email or web address]   Consulted and Agree with Plan of Care Patient             Patient will benefit from skilled therapeutic intervention in order to improve the following deficits and impairments:  Difficulty walking, Pain, Decreased activity tolerance, Postural dysfunction, Decreased strength  Visit Diagnosis: Pain in right hip  Pain in left hip  Chronic bilateral low back pain with bilateral sciatica  Muscle weakness (generalized)  Difficulty in walking, not elsewhere classified     Problem List Patient Active Problem List   Diagnosis Date Noted   Partial small bowel obstruction ([place] 03/10/2020   Small bowel obstruction due to adhesions (HR[place] 03/08/2020   Enteritis 10/15/2019   Abnormal liver function 10/15/2019   Leukocytosis 10/15/2019   Nausea & vomiting 10/15/2019   GAD (generalized anxiety disorder) 05/18/2017   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   Constipation 04/11/2017   Bee sting 04/05/2017   Diverticulosis 11/29/2014   Healthcare maintenance 11/06/2014   Cough 10/05/2014   Insomnia 09/13/2014   Erectile dysfunction 11/30/2013   Chronic pain syndrome 11/30/2013   Depression 11/30/2013   GERD (gastroesophageal reflux disease)    Hypertension    Anxiety    Arthritis    DJD (degenerative joint disease) of lumbar spine    Radiation    Night sweats 08/12/2012   Tobacco abuse counseling 05/26/2012   Lymphocytic colitis 05/03/2012   B12 nutritional deficiency 03/19/2012   Folate deficiency 03/19/2012   Inguinal hernia 11/24/2011   Cancer (HS[place] 10/14/2011   Internal hemorrhoid  09/11/2011   Anal cancer (HH[place] 10/09/2010    AGar PontoMS, PT 03/29/21 8:27 AM   CMaitlandCEndoscopy Group LLC[address] [place] [address]Phone: [phone]  Fax:  [phone] Name: Martin MICHELLEMRN: [id number] of Birth: [birth year]

## 2021-04-03 ENCOUNTER — Ambulatory Visit: Payer: Medicare Other

## 2021-04-05 ENCOUNTER — Ambulatory Visit: Payer: Medicare Other

## 2021-04-10 ENCOUNTER — Other Ambulatory Visit: Payer: Self-pay

## 2021-04-10 ENCOUNTER — Ambulatory Visit
Admission: EM | Admit: 2021-04-10 | Discharge: 2021-04-10 | Disposition: A | Discharge status: Home / Self Care | Payer: Medicare Other | Attending: Family Medicine | Admitting: Family Medicine

## 2021-04-10 ENCOUNTER — Ambulatory Visit: Payer: Medicare Other | Attending: Orthopaedic Surgery

## 2021-04-10 DIAGNOSIS — M6281 Muscle weakness (generalized): Secondary | ICD-10-CM | POA: Insufficient documentation

## 2021-04-10 DIAGNOSIS — R262 Difficulty in walking, not elsewhere classified: Secondary | ICD-10-CM | POA: Insufficient documentation

## 2021-04-10 DIAGNOSIS — M25552 Pain in left hip: Secondary | ICD-10-CM | POA: Diagnosis present

## 2021-04-10 DIAGNOSIS — F17210 Nicotine dependence, cigarettes, uncomplicated: Secondary | ICD-10-CM | POA: Diagnosis not present

## 2021-04-10 DIAGNOSIS — M25551 Pain in right hip: Secondary | ICD-10-CM | POA: Insufficient documentation

## 2021-04-10 DIAGNOSIS — G8929 Other chronic pain: Secondary | ICD-10-CM | POA: Diagnosis present

## 2021-04-10 DIAGNOSIS — M5441 Lumbago with sciatica, right side: Secondary | ICD-10-CM | POA: Insufficient documentation

## 2021-04-10 DIAGNOSIS — R062 Wheezing: Secondary | ICD-10-CM | POA: Diagnosis not present

## 2021-04-10 DIAGNOSIS — R053 Chronic cough: Secondary | ICD-10-CM | POA: Diagnosis not present

## 2021-04-10 DIAGNOSIS — M5442 Lumbago with sciatica, left side: Secondary | ICD-10-CM | POA: Diagnosis present

## 2021-04-10 MED ORDER — UMECLIDINIUM-VILANTEROL 62.5-25 MCG/INH IN AEPB: 1.0000 | INHALATION_SPRAY | Freq: Every day | RESPIRATORY_TRACT | 2 refills | Status: DC

## 2021-04-10 MED ORDER — PREDNISONE 10 MG PO TABS: ORAL_TABLET | ORAL | 0 refills | Status: DC

## 2021-04-10 NOTE — ED Provider Notes (Signed)
[name]    CSN: [id number] Arrival date & time: 04/10/21  0919      History   Chief Complaint Chief Complaint  Patient presents with   Cough    HPI Martin [name] is a 60 y.o. male.   Patient presenting today with 58-monthhistory of productive cough, wheezing, occasional shortness of breath.  Denies fever, chills, body aches, chest pain, sore throat, congestion.  Was treated several weeks ago with doxycycline, cough syrups, albuterol inhaler.  Has been overusing his albuterol inhaler as it is the only thing that seems to provide some amount of relief.  Has a long history of cigarette smoking.  Awaiting to get established with a primary care provider, has not had one in years.  Does have history of seasonal allergies not currently on any medications for this.  History of pneumonia additionally.   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    Anxiety    Arthritis    DDD lumbar, arthritis knees   DJD (degenerative joint disease) of lumbar spine    GERD (gastroesophageal reflux disease)    Hemorrhoid    internal   History of bowel resection    Hypertension    EKG 12/12 EPIC   no  PCP- states increased lately but hasnt been diagnosed formally   Inguinal hernia    Pneumonia    as child, cough at present with no fever   Pneumonia    as child, cough at present with no fever    Radiation 11/19/11-01/08/12   5040 cGy 28 fx Pelvis and inguinal area    Patient Active Problem List   Diagnosis Date Noted   Partial small bowel obstruction ([place] 03/10/2020   Small bowel obstruction due to adhesions ([place] 03/08/2020   Enteritis 10/15/2019   Abnormal liver function 10/15/2019   Leukocytosis 10/15/2019   Nausea & vomiting 10/15/2019   GAD (generalized anxiety disorder) 05/18/2017   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   Constipation 04/11/2017   Bee sting 04/05/2017   Diverticulosis  11/29/2014   Healthcare maintenance 11/06/2014   Cough 10/05/2014   Insomnia 09/13/2014   Erectile dysfunction 11/30/2013   Chronic pain syndrome 11/30/2013   Depression 11/30/2013   GERD (gastroesophageal reflux disease)    Hypertension    Anxiety    Arthritis    DJD (degenerative joint disease) of lumbar spine    Radiation    Night sweats 08/12/2012   Tobacco abuse counseling 05/26/2012   Lymphocytic colitis 05/03/2012   B12 nutritional deficiency 03/19/2012   Folate deficiency 03/19/2012   Inguinal hernia 11/24/2011   Cancer ([place] 10/14/2011   Internal hemorrhoid 09/11/2011   Anal cancer ([facility] 10/09/2010    Past Surgical History:  Procedure Laterality Date   APPENDECTOMY     [age]or 89  EXAMINATION UNDER ANESTHESIA  10/14/2011   Procedure: EXAM UNDER ANESTHESIA;  Surgeon: TEarnstine Regal MD;  Location: [facility];  Service: General;  Laterality: N/A;  exam under anethesia, Excision of mass anal canal, 1.5cm   INGUINAL HERNIA REPAIR  05/11/2012   Procedure: HERNIA REPAIR INGUINAL ADULT;  Surgeon: TEarnstine Regal MD;  [place] [facility]  Service: General;  Laterality: Left;  left inguinal hernia repair with mesh   surgical pathology   10/14/2011   squamous cell ca of anus   transanal excision  10/14/2011  Dr.Todd Gerkin       Home Medications    Prior to Admission medications   Medication Sig Start Date End Date Taking? Authorizing Provider  predniSONE (DELTASONE) 10 MG tablet Take 6 tabs day one, 5 tabs day two, 4 tabs day three, etc 04/10/21  Yes Volney American, PA-C  umeclidinium-vilanterol (ANORO ELLIPTA) 62.5-25 MCG/INH AEPB Inhale 1 puff into the lungs daily. 04/10/21  Yes Volney American, PA-C  albuterol (VENTOLIN HFA) 108 (90 Base) MCG/ACT inhaler Inhale 2 puffs into the lungs every 6 (six) hours as needed for wheezing or shortness of breath. 02/09/21   Jaynee Eagles, PA-C  dextromethorphan-guaiFENesin [facility] DM) 30-600 MG 12hr tablet Take 1  tablet by mouth 2 (two) times daily. 02/25/21   Wieters, Hallie C, PA-C  EPINEPHrine (EPIPEN 2-PAK) 0.3 mg/0.3 mL IJ SOAJ injection Inject 0.3 mLs (0.3 mg total) into the muscle as needed for anaphylaxis. 02/23/20   Long, Wonda Olds, MD  HYDROcodone bit-homatropine (HYCODAN) 5-1.5 MG/5ML syrup Take 5 mLs by mouth at bedtime as needed for cough. 02/25/21   Wieters, Hallie C, PA-C  omeprazole (PRILOSEC) 20 MG capsule Take 1 capsule (20 mg total) by mouth 2 (two) times daily before a meal for 15 days. 09/26/20 10/11/20  Wieters, Hallie C, PA-C  tamsulosin (FLOMAX) 0.4 MG CAPS capsule Take 1 capsule (0.4 mg total) by mouth at bedtime. 10/09/20   Gery Pray, MD  sucralfate (CARAFATE) 1 GM/10ML suspension Take 10 mLs (1 g total) by mouth 4 (four) times daily -  with meals and at bedtime. Patient not taking: Reported on 11/28/2019 10/21/19 06/13/20  Randal Buba, April, MD    Family History Family History  Problem Relation Age of Onset   Colon cancer Father    Asthma Father    Cancer Father        prostate   Hypertension Father    Asthma Mother    Hypertension Mother    Stomach cancer Neg Hx    Esophageal cancer Neg Hx     Social History Social History   Tobacco Use   Smoking status: Every Day    Packs/day: 1.25    Years: 47.00    Pack years: 58.75    Types: Cigarettes   Smokeless tobacco: Never   Tobacco comments:    Cutting back>> Quit Smart card supplied  Substance Use Topics   Alcohol use: Not Currently    Alcohol/week: 6.0 standard drinks    Types: 6 Cans of beer per week    Comment: weekend    6pk weekend, beer    Drug use: Not Currently    Types: Marijuana, Cocaine    Comment: marijuana in past, occasionally now-last use 3-4 weeks ago     Allergies   Yellow jacket venom [bee venom], Aspirin, Codeine, and Morphine and related   Review of Systems Review of Systems Per HPI  Physical Exam Triage Vital Signs ED Triage Vitals [04/10/21 1201]  Enc Vitals Group     BP (!) 162/63      Pulse Rate 63     Resp 18     Temp 98.1 F (36.7 C)     Temp Source Oral     SpO2 96 %     Weight      Height      Head Circumference      Peak Flow      Pain Score 0     Pain Loc      Pain Edu?  Excl. in [place]?    No data found.  Updated Vital Signs BP (!) 162/63 (BP Location: Left Arm)   Pulse 63   Temp 98.1 F (36.7 C) (Oral)   Resp 18   SpO2 96%   Visual Acuity Right Eye Distance:   Left Eye Distance:   Bilateral Distance:    Right Eye Near:   Left Eye Near:    Bilateral Near:     Physical Exam Vitals and nursing note reviewed.  Constitutional:      Appearance: Normal appearance.  HENT:     Head: Atraumatic.  Eyes:     Extraocular Movements: Extraocular movements intact.     Conjunctiva/sclera: Conjunctivae normal.  Cardiovascular:     Rate and Rhythm: Normal rate and regular rhythm.  Pulmonary:     Effort: Pulmonary effort is normal. No respiratory distress.     Breath sounds: Wheezing present. No rales.     Comments: Moderate diffuse wheezes, decreased breath sounds throughout Musculoskeletal:        General: Normal range of motion.     Cervical back: Normal range of motion and neck supple.  Skin:    General: Skin is warm and dry.  Neurological:     General: No focal deficit present.     Mental Status: He is oriented to person, place, and time.  Psychiatric:        Mood and Affect: Mood normal.        Thought Content: Thought content normal.        Judgment: Judgment normal.     UC Treatments / Results  Labs (all labs ordered are listed, but only abnormal results are displayed) Labs Reviewed  NOVEL CORONAVIRUS, NAA    EKG   Radiology No results found.  Procedures Procedures (including critical care time)  Medications Ordered in UC Medications - No data to display  Initial Impression / Assessment and Plan / UC Course  I have reviewed the triage vital signs and the nursing notes.  Pertinent labs & imaging results that were  available during my care of the patient were reviewed by me and considered in my medical decision making (see chart for details).     Strong suspicion for COPD, he is waiting to establish care with primary care provider who will likely be able to provide spirometry testing and further evaluation on this.  In the meantime, we will start prednisone taper, Anoro in addition to his albuterol rescue inhaler.  Recommended Mucinex while symptoms exacerbated additionally.  No evidence of pneumonia at this time.  Follow-up if worsening or not resolving.  Final Clinical Impressions(s) / UC Diagnoses   Final diagnoses:  Chronic cough  Wheezing  Cigarette smoker   Discharge Instructions   None    ED Prescriptions     Medication Sig Dispense Auth. Provider   umeclidinium-vilanterol (ANORO ELLIPTA) 62.5-25 MCG/INH AEPB Inhale 1 puff into the lungs daily. 60 each Volney American, PA-C   predniSONE (DELTASONE) 10 MG tablet Take 6 tabs day one, 5 tabs day two, 4 tabs day three, etc 21 tablet Volney American, [place]      PDMP not reviewed this encounter.   Volney American, [place] 04/10/21 1252

## 2021-04-10 NOTE — Therapy (Signed)
[place], [place], [address] Phone: [phone]   Fax:  [phone]  Physical Therapy Treatment  Patient Details  Name: Martin [name] MRN: [id number] Date of Birth: 1961/06/21 Referring Provider (PT): Leandrew Koyanagi, MD   Encounter Date: 04/10/2021   PT End of Session - 04/10/21 0745     Visit Number 6    Number of Visits 13    Date for PT Re-Evaluation 04/20/21    Authorization Type MEDICARE PART A AND B    Progress Note Due on Visit 10    PT Start Time 0730   15 mins late for appt   PT Stop Time 0810    PT Time Calculation (min) 40 min    Activity Tolerance Patient tolerated treatment well    Behavior During Therapy [facility] for tasks assessed/performed             Past Medical History:  Diagnosis Date   Allergy 2 2013   Anal cancer ([place]) 10/14/11   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    Anxiety    Arthritis    DDD lumbar, arthritis knees   DJD (degenerative joint disease) of lumbar spine    GERD (gastroesophageal reflux disease)    Hemorrhoid    internal   History of bowel resection    Hypertension    EKG 12/12 EPIC   no  PCP- states increased lately but hasnt been diagnosed formally   Inguinal hernia    Pneumonia    as child, cough at present with no fever   Pneumonia    as child, cough at present with no fever    Radiation 11/19/11-01/08/12   5040 cGy 28 fx Pelvis and inguinal area    Past Surgical History:  Procedure Laterality Date   APPENDECTOMY     age 55 or 66   EXAMINATION UNDER ANESTHESIA  10/14/2011   Procedure: EXAM UNDER ANESTHESIA;  Surgeon: Earnstine Regal, MD;  Location: [facility];  Service: General;  Laterality: N/A;  exam under anethesia, Excision of mass anal canal, 1.5cm   INGUINAL HERNIA REPAIR  05/11/2012   Procedure: HERNIA REPAIR INGUINAL ADULT;  Surgeon: Earnstine Regal, MD;  Location: [place];  Service: General;  Laterality: Left;  left inguinal  hernia repair with mesh   surgical pathology   10/14/2011   squamous cell ca of anus   transanal excision  10/14/2011   Dr.Todd Gerkin    There were no vitals filed for this visit.   Subjective Assessment - 04/10/21 0735     Subjective Pt reports he has not been doing well with several different issues. The L hip (post. gluteal) has been bothering him more and he notes breaking a few teeth at an incident at work. Pt reports one indicent of having difficulty moving his L leg, feeling like it was dead.    Pain Score 7    5/10 sitting   Pain Location Back   hip   Pain Orientation Left    Pain Descriptors / Indicators Aching;Stabbing    Pain Type Chronic pain    Pain Onset More than a month ago                               [facility] Adult PT Treatment/Exercise - 04/10/21 0001       Lumbar Exercises: Stretches   Active Hamstring Stretch  Right;Left;2 reps;20 seconds    Active Hamstring Stretch Limitations seated    Lower Trunk Rotation 5 reps;10 seconds    Hip Flexor Stretch Right;Left;2 reps;30 seconds    Hip Flexor Stretch Limitations Thomas    Piriformis Stretch Right;Left;2 reps;20 seconds    Other Lumbar Stretch Exercise Open book, L and R, 10x, 5 sec    Other Lumbar Stretch Exercise Seated trunk flexion; forward and laterally stretch 2x, 15sec each. Open book-L and R 5x, 10 sec      Lumbar Exercises: Supine   Pelvic Tilt 15 reps   3 sec                   PT Education - 04/10/21 0819     Education Details Review of HEP. Complete flexibility exs daily and strengthening exs every other day.    Person(s) Educated Patient    Methods Explanation;Demonstration;Tactile cues;Verbal cues;Handout    Comprehension Verbalized understanding;Returned demonstration;Verbal cues required;Tactile cues required              PT Short Term Goals - 03/22/21 2123       PT SHORT TERM GOAL #1   Title Pt will be ind in an inital HEP    Status Achieved    Target  Date 03/22/21      PT SHORT TERM GOAL #2   Title Pt will voice understanding of measures to assist in the management of low back pain    Status Achieved               PT Long Term Goals - 02/26/21 1744       PT LONG TERM GOAL #1   Title Pt will report a decrease in low back and LE pain to 6/10 with walking for 20 mins    Baseline 10/10 with walking 5 mins    Status New    Target Date 04/20/21      PT LONG TERM GOAL #2   Title Increase bilat hip strength to 4+/5 for improved hip stability to decrease pain and improve function    Status New    Target Date 04/20/21      PT LONG TERM GOAL #3   Title pt will return demonstration of proper body mechanics for household activiites to assist in the reduction of low back strain    Status New    Target Date 04/20/21      PT LONG TERM GOAL #4   Title Pt's FOTO score will improve from 45% to 56% functional abilitiy    Baseline 45%    Status New    Target Date 04/20/21      PT LONG TERM GOAL #5   Title Pt will be Ind in a final HEP to maintain or progress achieved LOF    Status New    Target Date 04/20/21                   Plan - 04/10/21 0820     Clinical Impression Statement PT was completed for lumbopelvic mobility and strengthening with a flexion bias for mobility. Following the completion of the exs the pt reported hip low back and L gluteal pain resolved. Pt was advised to reschedule with Erlinda Hong if he feels like he is not managing his back pain well. Pt was encouraged to complete his HEP as prescribe which does provide him relief when completed at his PT appts.    Personal Factors and Comorbidities Age;Past/Current Experience;Time since onset  of injury/illness/exacerbation;Comorbidity 2;Comorbidity 1;Other    Comorbidities arthritis, DDD    Examination-Activity Limitations Stand;Locomotion Level    Examination-Participation Restrictions Other    Stability/Clinical Decision Making Stable/Uncomplicated    Clinical  Decision Making Low    Rehab Potential Good    PT Frequency 2x / week    PT Duration 6 weeks    PT Treatment/Interventions ADLs/Self Care Home Management;Electrical Stimulation;Iontophoresis 81m/ml Dexamethasone;Moist Heat;Traction;Functional mobility training;Therapeutic activities;Therapeutic exercise;Manual techniques;Patient/family education;Passive range of motion;Dry needling;Taping;Spinal Manipulations    PT Next Visit Plan Progress ther ex as indicated for trunk strengthening    PT Home Exercise Plan [id number]   Consulted and Agree with Plan of Care Patient             Patient will benefit from skilled therapeutic intervention in order to improve the following deficits and impairments:  Difficulty walking, Pain, Decreased activity tolerance, Postural dysfunction, Decreased strength  Visit Diagnosis: Pain in right hip  Pain in left hip  Chronic bilateral low back pain with bilateral sciatica  Muscle weakness (generalized)  Difficulty in walking, not elsewhere classified     Problem List Patient Active Problem List   Diagnosis Date Noted   [place] ([place] 03/10/2020   Small bowel obstruction due to adhesions ([place] 03/08/2020   Enteritis 10/15/2019   Abnormal liver function 10/15/2019   Leukocytosis 10/15/2019   Nausea & vomiting 10/15/2019   GAD (generalized anxiety disorder) 05/18/2017   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   Constipation 04/11/2017   Bee sting 04/05/2017   Diverticulosis 11/29/2014   Healthcare maintenance 11/06/2014   Cough 10/05/2014   Insomnia 09/13/2014   Erectile dysfunction 11/30/2013   Chronic pain syndrome 11/30/2013   Depression 11/30/2013   GERD (gastroesophageal reflux disease)    Hypertension    Anxiety    Arthritis    DJD (degenerative joint disease) of lumbar spine    Radiation    Night sweats 08/12/2012   Tobacco abuse counseling 05/26/2012   Lymphocytic colitis 05/03/2012   B12 nutritional deficiency  03/19/2012   Folate deficiency 03/19/2012   Inguinal hernia 11/24/2011   Cancer ([facility] 10/14/2011   Internal hemorrhoid 09/11/2011   Anal cancer ([facility] 10/09/2010   AGar PontoMS, PT 04/10/21 8:34 AM   [facility] [address]Phone: [phone]  Fax:  [phone] Name: Martin GERVASIMRN: [id number] of Birth: 518-Feb-1962

## 2021-04-10 NOTE — ED Triage Notes (Signed)
Pt c/o cough and congestion that has been happening for "a couple of months" now with associated mild headache. States he came to this clinic before and completed the prescribed regimen without relief.

## 2021-04-11 LAB — NOVEL CORONAVIRUS, NAA: SARS-CoV-2, NAA: NOT DETECTED

## 2021-04-11 LAB — SARS-COV-2, NAA 2 DAY TAT

## 2021-04-12 ENCOUNTER — Ambulatory Visit: Payer: Medicare Other

## 2021-04-12 ENCOUNTER — Other Ambulatory Visit: Payer: Self-pay

## 2021-04-12 DIAGNOSIS — M6281 Muscle weakness (generalized): Secondary | ICD-10-CM

## 2021-04-12 DIAGNOSIS — R262 Difficulty in walking, not elsewhere classified: Secondary | ICD-10-CM

## 2021-04-12 DIAGNOSIS — M25551 Pain in right hip: Secondary | ICD-10-CM | POA: Diagnosis not present

## 2021-04-12 DIAGNOSIS — M25552 Pain in left hip: Secondary | ICD-10-CM

## 2021-04-12 DIAGNOSIS — G8929 Other chronic pain: Secondary | ICD-10-CM

## 2021-04-12 NOTE — Therapy (Addendum)
[place], [place], [address] Phone: [phone]   Fax:  [phone]  Physical Therapy Treatment/Discharge  Patient Details  Name: Martin [name] MRN: [id number] Date of Birth: Mar 13, 1961 Referring Provider (PT): Leandrew Koyanagi, MD   Encounter Date: 04/12/2021   PT End of Session - 04/12/21 0809     Visit Number 7    Number of Visits 13    Date for PT Re-Evaluation 04/20/21    Authorization Type MEDICARE PART A AND B    PT Start Time 0717    PT Stop Time 0803    PT Time Calculation (min) 46 min    Activity Tolerance Patient tolerated treatment well    Behavior During Therapy [facility] for tasks assessed/performed             Past Medical History:  Diagnosis Date   Allergy 2 2013   Anal cancer ([place]) 10/14/11   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    Anxiety    Arthritis    DDD lumbar, arthritis knees   DJD (degenerative joint disease) of lumbar spine    GERD (gastroesophageal reflux disease)    Hemorrhoid    internal   History of bowel resection    Hypertension    EKG 12/12 EPIC   no  PCP- states increased lately but hasnt been diagnosed formally   Inguinal hernia    Pneumonia    as child, cough at present with no fever   Pneumonia    as child, cough at present with no fever    Radiation 11/19/11-01/08/12   5040 cGy 28 fx Pelvis and inguinal area    Past Surgical History:  Procedure Laterality Date   APPENDECTOMY     age 89 or 54   EXAMINATION UNDER ANESTHESIA  10/14/2011   Procedure: EXAM UNDER ANESTHESIA;  Surgeon: Earnstine Regal, MD;  Location: [facility];  Service: General;  Laterality: N/A;  exam under anethesia, Excision of mass anal canal, 1.5cm   INGUINAL HERNIA REPAIR  05/11/2012   Procedure: HERNIA REPAIR INGUINAL ADULT;  Surgeon: Earnstine Regal, MD;  Location: [place];  Service: General;  Laterality: Left;  left inguinal hernia repair with mesh   surgical pathology    10/14/2011   squamous cell ca of anus   transanal excision  10/14/2011   Dr.Todd Gerkin    There were no vitals filed for this visit.   Subjective Assessment - 04/12/21 0722     Subjective Pt reports now pain currently being early in the day. Yesterday he reports walking alot at work and he had alot of bilat hip.    Diagnostic tests MRI mentioned in Dr. Erlinda Hong note. indicates more of an issue with degenerative changes of the lumbar spine    Patient Stated Goals To get out of pain and get my strength back                               [facility] Adult PT Treatment/Exercise - 04/12/21 0001       Lumbar Exercises: Stretches   Active Hamstring Stretch Right;Left;2 reps;20 seconds    Active Hamstring Stretch Limitations seated    Hip Flexor Stretch Right;Left;2 reps;30 seconds    Hip Flexor Stretch Limitations [place]    Other Lumbar Stretch Exercise Seated trunk flexion; forward and laterally stretch 2x, 15sec each. Open book-L and R 5x, 10  sec      Lumbar Exercises: Aerobic   Nustep L5 x 5 min UEs/LEs      Lumbar Exercises: Standing   Scapular Retraction Both;15 reps   2 sets   Theraband Level (Scapular Retraction) Level 3 (Green)    Row Both;15 reps   2 sets   Theraband Level (Row) Level 3 (Green)      Lumbar Exercises: Seated   Sit to Stand Limitations 10x2      Lumbar Exercises: Prone   Straight Leg Raise 10 reps                    PT Education - 04/12/21 0630     Education Details HEP update. instruction in proper body mchanics with work applications.    Person(s) Educated Patient    Methods Explanation;Demonstration;Tactile cues;Verbal cues;Handout    Comprehension Verbalized understanding;Returned demonstration;Verbal cues required;Tactile cues required              PT Short Term Goals - 03/22/21 2123       PT SHORT TERM GOAL #1   Title Pt will be ind in an inital HEP    Status Achieved    Target Date 03/22/21      PT SHORT TERM GOAL  #2   Title Pt will voice understanding of measures to assist in the management of low back pain    Status Achieved               PT Long Term Goals - 02/26/21 1744       PT LONG TERM GOAL #1   Title Pt will report a decrease in low back and LE pain to 6/10 with walking for 20 mins    Baseline 10/10 with walking 5 mins    Status New    Target Date 04/20/21      PT LONG TERM GOAL #2   Title Increase bilat hip strength to 4+/5 for improved hip stability to decrease pain and improve function    Status New    Target Date 04/20/21      PT LONG TERM GOAL #3   Title pt will return demonstration of proper body mechanics for household activiites to assist in the reduction of low back strain    Status New    Target Date 04/20/21      PT LONG TERM GOAL #4   Title Pt's FOTO score will improve from 45% to 56% functional abilitiy    Baseline 45%    Status New    Target Date 04/20/21      PT LONG TERM GOAL #5   Title Pt will be Ind in a final HEP to maintain or progress achieved LOF    Status New    Target Date 04/20/21                   Plan - 04/12/21 0810     Clinical Impression Statement PT was provided to lumbopelvic flexibility with flexion bias. PT initiated higher level lumbopelvic strengthening exs for trunk stability. Pt tolerated these exs without increase in his low back or hip pain. These exs were addedd to the pt's HEP. Provided instruction for proper body mechanics with work applications. Pt voiced understanding of body mechanic instructions and returned demonstration of new ther ex.    Personal Factors and Comorbidities Age;Past/Current Experience;Time since onset of injury/illness/exacerbation;Comorbidity 2;Comorbidity 1;Other    Comorbidities arthritis, DDD    Examination-Activity Limitations Stand;Locomotion Level  Examination-Participation Restrictions Other    Stability/Clinical Decision Making Stable/Uncomplicated    Clinical Decision Making Low     Rehab Potential Good    PT Frequency 2x / week    PT Duration 6 weeks    PT Treatment/Interventions ADLs/Self Care Home Management;Electrical Stimulation;Iontophoresis 57m/ml Dexamethasone;Moist Heat;Traction;Functional mobility training;Therapeutic activities;Therapeutic exercise;Manual techniques;Patient/family education;Passive range of motion;Dry needling;Taping;Spinal Manipulations    PT Next Visit Plan Re-assess for continuation or DC from PT services.    PT Home Exercise Plan [id number]   Consulted and Agree with Plan of Care Patient             Patient will benefit from skilled therapeutic intervention in order to improve the following deficits and impairments:  Difficulty walking, Pain, Decreased activity tolerance, Postural dysfunction, Decreased strength  Visit Diagnosis: Pain in right hip  Pain in left hip  Chronic bilateral low back pain with bilateral sciatica  Muscle weakness (generalized)  Difficulty in walking, not elsewhere classified     Problem List Patient Active Problem List   Diagnosis Date Noted   Partial small bowel obstruction ([place] 03/10/2020   Small bowel obstruction due to adhesions ([place] 03/08/2020   Enteritis 10/15/2019   Abnormal liver function 10/15/2019   Leukocytosis 10/15/2019   Nausea & vomiting 10/15/2019   GAD (generalized anxiety disorder) 05/18/2017   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   Constipation 04/11/2017   Bee sting 04/05/2017   Diverticulosis 11/29/2014   Healthcare maintenance 11/06/2014   Cough 10/05/2014   Insomnia 09/13/2014   Erectile dysfunction 11/30/2013   Chronic pain syndrome 11/30/2013   Depression 11/30/2013   GERD (gastroesophageal reflux disease)    Hypertension    Anxiety    Arthritis    DJD (degenerative joint disease) of lumbar spine    Radiation    Night sweats 08/12/2012   Tobacco abuse counseling 05/26/2012   Lymphocytic colitis 05/03/2012   B12 nutritional deficiency 03/19/2012   Folate  deficiency 03/19/2012   Inguinal hernia 11/24/2011   Cancer ([place] 10/14/2011   Internal hemorrhoid 09/11/2011   [place] cancer ([place] 10/09/2010   AGar PontoMS, PT 04/12/21 8:21 AM   [facility]GO[address] [place] [address]Phone: [phone]  Fax:  [phone] Name: HJOVANTE HAMMITTMRN: [id number] of Birth: 509-26-1962 PHYSICAL THERAPY DISCHARGE SUMMARY  Visits from Start of Care:7  Current functional level related to goals / functional outcomes: See above   Remaining deficits: See above   Education / Equipment: HEP  Patient agrees to discharge. Patient goals were partially met. Patient is being discharged due to lack of progress.   Pt did not show for his last PT appt. Over the course of PT, pt participated consistently at PT and per his report with his HEP at home. Pt received temporary relief from ther ex, but pain would return with prolonged standing, walking, and lifting. Recommended pt follow up with Dr. XErlinda Hongregarding further recommendations re: care for his low back.  Deanette Tullius MS, PT 04/18/21 8:44 AM

## 2021-04-17 ENCOUNTER — Ambulatory Visit: Payer: Medicare Other

## 2021-04-18 ENCOUNTER — Telehealth: Payer: Self-pay

## 2021-04-18 NOTE — Telephone Encounter (Signed)
Spoke to Martin [name] re: no show for his final Martin [name] appt. Martin [name] was encouraged to f/u with Dr. Erlinda Hong re: recommended care at this point.

## 2021-04-20 NOTE — Progress Notes (Signed)
Virtual Visit via Telephone Note  I connected with Martin [name], on 04/22/2021 at 2:20 PM by telephone due to the COVID-19 pandemic and verified that I am speaking with the correct person using two identifiers.  Due to current restrictions/limitations of in-office visits due to the COVID-19 pandemic, this scheduled clinical appointment was converted to a telehealth visit.   Consent: I discussed the limitations, risks, security and privacy concerns of performing an evaluation and management service by telephone and the availability of in person appointments. I also discussed with the patient that there may be a patient responsible charge related to this service. The patient expressed understanding and agreed to proceed.   Location of Patient: Home  Location of Provider: [facility] participating in Telemedicine visit: [place], NP Elmon Else, CMA   History of Present Illness: Martin [name] is a 60 year-old male who presents to establish care.   Current issues and/or concerns: BACK PAIN: History of back pain radiating to bilateral hips and legs. Reports in the past saw a back doctor. Around that time had an MRI of back completed and told spine was in good shape and the issue of concern was his hips.   Patient then referred to a hip doctor. The hip doctor told him that his hips were fine and he needs to return to the back doctor.   Patient was unable to follow through with care because of lack of health insurance. Currently has health insurance and ready to continue care.   2. CHRONIC COUGH FOLLOW-UP: Recently seen at the [facility] on 04/10/2021. Per PA note at that time strong suspicion for COPD, he is waiting to establish care with primary care provider who will likely be able to provide spirometry testing and further evaluation on this. In the meantime, we will start prednisone taper,  Anoro in addition to his albuterol rescue inhaler.  Recommended Mucinex while symptoms exacerbated additionally.  No evidence of pneumonia at this time.  Follow-up if worsening or not resolving.  Today still having productive cough with yellow sputum. Still using inhaler and completed steroids. Difficulty breathing good. Current smoker at least 1 pack daily which is down from 2 packs daily.   3. DENTIST REFERRAL: Concern for several teeth broken off to the gum. Challenges with eating and chewing. Would like to see dentist.     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    Anxiety    Arthritis    DDD lumbar, arthritis knees   DJD (degenerative joint disease) of lumbar spine    GERD (gastroesophageal reflux disease)    Hemorrhoid    internal   History of bowel resection    Hypertension    EKG 12/12 EPIC   no  PCP- states increased lately but hasnt been diagnosed formally   Inguinal hernia    Pneumonia    as child, cough at present with no fever   Pneumonia    as child, cough at present with no fever    Radiation 11/19/11-01/08/12   5040 cGy 28 fx Pelvis and inguinal area   Allergies  Allergen Reactions   Yellow Jacket Venom [Bee Venom] Anaphylaxis   Aspirin Swelling   Codeine Nausea Only   Morphine And Related Itching    Current Outpatient Medications on File  Prior to Visit  Medication Sig Dispense Refill   albuterol (VENTOLIN HFA) 108 (90 Base) MCG/ACT inhaler Inhale 2 puffs into the lungs every 6 (six) hours as needed for wheezing or shortness of breath. 18 g 2   dextromethorphan-guaiFENesin (MUCINEX DM) 30-600 MG 12hr tablet Take 1 tablet by mouth 2 (two) times daily. 16 tablet 0   EPINEPHrine (EPIPEN 2-PAK) 0.3 mg/0.3 mL IJ SOAJ injection Inject 0.3 mLs (0.3 mg total) into the muscle as needed for anaphylaxis. 1 each 0   HYDROcodone bit-homatropine (HYCODAN) 5-1.5 MG/5ML syrup Take 5 mLs by  mouth at bedtime as needed for cough. 75 mL 0   omeprazole (PRILOSEC) 20 MG capsule Take 1 capsule (20 mg total) by mouth 2 (two) times daily before a meal for 15 days. 30 capsule 0   predniSONE (DELTASONE) 10 MG tablet Take 6 tabs day one, 5 tabs day two, 4 tabs day three, etc 21 tablet 0   tamsulosin (FLOMAX) 0.4 MG CAPS capsule Take 1 capsule (0.4 mg total) by mouth at bedtime. 30 capsule 2   umeclidinium-vilanterol (ANORO ELLIPTA) 62.5-25 MCG/INH AEPB Inhale 1 puff into the lungs daily. 60 each 2   [DISCONTINUED] sucralfate (CARAFATE) 1 GM/10ML suspension Take 10 mLs (1 g total) by mouth 4 (four) times daily -  with meals and at bedtime. (Patient not taking: Reported on 11/28/2019) 420 mL 0   No current facility-administered medications on file prior to visit.    Observations/Objective: Alert and oriented x 3. Not in acute distress. Physical examination not completed as this is a telemedicine visit.  Assessment and Plan: 1. Encounter to establish care: - Patient presents today to establish care.  - Return for annual physical examination, labs, and health maintenance. Arrive fasting meaning having no food for at least 8 hours prior to appointment. You may have only water or black coffee. Please take scheduled medications as normal.  2. Chronic bilateral low back pain, unspecified whether sciatica present: - Referral to Orthopedic Surgery for further evaluation and management. - Ambulatory referral to Orthopedic Surgery  3. Chronic cough: 4. Declined smoking cessation: - Patient currently smoking at least 1 pack daily. This is decreased from previous 2 packs daily. Not ready to quit as of present.  - Referral to Pulmonology for further evaluation and management of chronic cough which may be related to possible COPD.  - Follow-up with primary provider as scheduled.  - Ambulatory referral to Pulmonology  5. Encounter for routine dental examination: - Referral to Dentistry for further  evaluation and management.  - Ambulatory referral to Dentistry   Follow Up Instructions: Return for annual physical exam. Follow-up with primary provider as scheduled.    Patient was given clear instructions to go to Emergency Department or return to medical center if symptoms don't improve, worsen, or new problems develop.The patient verbalized understanding.  I discussed the assessment and treatment plan with the patient. The patient was provided an opportunity to ask questions and all were answered. The patient agreed with the plan and demonstrated an understanding of the instructions.   The patient was advised to call back or seek an in-person evaluation if the symptoms worsen or if the condition fails to improve as anticipated.    I provided 15 minutes total of non-face-to-face time during this encounter.   Camillia Herter, NP  [facility] [place], [place] 04/22/2021, 2:20 PM

## 2021-04-22 ENCOUNTER — Other Ambulatory Visit: Payer: Self-pay

## 2021-04-22 ENCOUNTER — Telehealth (INDEPENDENT_AMBULATORY_CARE_PROVIDER_SITE_OTHER): Payer: Medicare Other | Admitting: Family

## 2021-04-22 DIAGNOSIS — M545 Low back pain, unspecified: Secondary | ICD-10-CM

## 2021-04-22 DIAGNOSIS — Z7689 Persons encountering health services in other specified circumstances: Secondary | ICD-10-CM

## 2021-04-22 DIAGNOSIS — R053 Chronic cough: Secondary | ICD-10-CM

## 2021-04-22 DIAGNOSIS — G8929 Other chronic pain: Secondary | ICD-10-CM

## 2021-04-22 DIAGNOSIS — Z720 Tobacco use: Secondary | ICD-10-CM | POA: Diagnosis not present

## 2021-04-22 DIAGNOSIS — Z0120 Encounter for dental examination and cleaning without abnormal findings: Secondary | ICD-10-CM

## 2021-04-30 ENCOUNTER — Other Ambulatory Visit: Payer: Self-pay

## 2021-04-30 ENCOUNTER — Encounter: Payer: Self-pay | Admitting: Orthopaedic Surgery

## 2021-04-30 ENCOUNTER — Ambulatory Visit (INDEPENDENT_AMBULATORY_CARE_PROVIDER_SITE_OTHER): Payer: Medicare Other | Admitting: Orthopaedic Surgery

## 2021-04-30 DIAGNOSIS — G8929 Other chronic pain: Secondary | ICD-10-CM

## 2021-04-30 DIAGNOSIS — M5442 Lumbago with sciatica, left side: Secondary | ICD-10-CM

## 2021-04-30 DIAGNOSIS — M5441 Lumbago with sciatica, right side: Secondary | ICD-10-CM

## 2021-04-30 NOTE — Progress Notes (Signed)
Office Visit Note   Patient: Martin [name]           Date of Birth: [birth year]           MRN: [id number] Visit Date: 04/30/2021              Requested by: Camillia Herter, NP [address] [place],  [place] [address] PCP: Patient, No Pcp Per (Inactive)   Assessment & Plan: Visit Diagnoses:  1. Chronic bilateral low back pain with bilateral sciatica     Plan: Impression is chronic low back pain with bilateral lower extremity radiculopathy which has not improved despite physical therapy for the past few months.  At this point, we have discussed epidural steroid injection.  Patient would like to proceed.  We have also discussed referral to Dr. Lorin Mercy with Dr. Louanne Skye for further evaluation if ESIs are ineffective.  He will follow-up with [place] as needed.  Follow-Up Instructions: Return if symptoms worsen or fail to improve.   Orders:  No orders of the defined types were placed in this encounter.  No orders of the defined types were placed in this encounter.     Procedures: No procedures performed   Clinical Data: No additional findings.   Subjective: Chief Complaint  Patient presents with   Right Hip - Pain   Lower Back - Pain    HPI patient is a pleasant 60 year old gentleman who comes in today with continued bilateral low back pain and bilateral lower extremity radiculopathy.  Recent lumbar spine MRI showed multilevel broad-based disc bulge with facet disease in addition to moderate lateral recess stenosis.  He has most recently been to physical therapy which has not seemed to help.  The pain appears to be worsening.  He does not think he has had an epidural steroid injection before and is requesting 1.  Review of Systems as detailed in HPI.  All others reviewed and are negative.   Objective: Vital Signs: There were no vitals taken for this visit.  Physical Exam well-developed well-nourished gentleman no acute distress.  Alert and oriented x3.  Ortho Exam  unchanged lumbar spine exam  Specialty Comments:  No specialty comments available.  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   Constipation 04/11/2017   Bee sting 04/05/2017   Diverticulosis 11/29/2014   Healthcare maintenance 11/06/2014   Cough 10/05/2014   Insomnia 09/13/2014   Erectile dysfunction 11/30/2013   Chronic pain syndrome 11/30/2013   Depression 11/30/2013   GERD (gastroesophageal reflux disease)    Hypertension    Anxiety    Arthritis    DJD (degenerative joint disease) of lumbar spine    Radiation    Night sweats 08/12/2012   Tobacco abuse counseling 05/26/2012   Lymphocytic colitis 05/03/2012   B12 nutritional deficiency 03/19/2012   Folate deficiency 03/19/2012   Inguinal hernia 11/24/2011   Cancer ([place]) 10/14/2011   Internal hemorrhoid 09/11/2011   Anal cancer ([place]) 10/09/2010   Past Medical History:  Diagnosis Date   Allergy 2 2013   Anal cancer ([place]) 10/14/11   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    Anxiety  Arthritis    DDD lumbar, arthritis knees   DJD (degenerative joint disease) of lumbar spine    GERD (gastroesophageal reflux disease)    Hemorrhoid    internal   History of bowel resection    Hypertension    EKG 12/12 EPIC   no  PCP- states increased lately but hasnt been diagnosed formally   Inguinal hernia    Pneumonia    as child, cough at present with no fever   Pneumonia    as child, cough at present with no fever    Radiation 11/19/11-01/08/12   5040 cGy 28 fx Pelvis and inguinal area    Family History  Problem Relation Age of Onset   Colon cancer Father     Asthma Father    Cancer Father        prostate   Hypertension Father    Asthma Mother    Hypertension Mother    Stomach cancer Neg Hx    Esophageal cancer Neg Hx     Past Surgical History:  Procedure Laterality Date   APPENDECTOMY     age 60 or 26   EXAMINATION UNDER ANESTHESIA  10/14/2011   Procedure: EXAM UNDER ANESTHESIA;  Surgeon: Earnstine Regal, MD;  Location: [facility];  Service: General;  Laterality: N/A;  exam under anethesia, Excision of mass anal canal, 1.5cm   INGUINAL HERNIA REPAIR  05/11/2012   Procedure: HERNIA REPAIR INGUINAL ADULT;  Surgeon: Earnstine Regal, MD;  Location: [place];  Service: General;  Laterality: Left;  left inguinal hernia repair with mesh   surgical pathology   10/14/2011   squamous cell ca of anus   transanal excision  10/14/2011   Dr.Todd Gerkin   Social History   Occupational History   Occupation: unemployed  Tobacco Use   Smoking status: Every Day    Packs/day: 1.25    Years: 47.00    Pack years: 58.75    Types: Cigarettes   Smokeless tobacco: Never   Tobacco comments:    Clinical biochemist card supplied  Substance and Sexual Activity   Alcohol use: Not Currently    Alcohol/week: 6.0 standard drinks    Types: 6 Cans of beer per week    Comment: weekend    6pk weekend, beer    Drug use: Not Currently    Types: Marijuana, Cocaine    Comment: marijuana in past, occasionally now-last use 3-4 weeks ago   Sexual activity: Yes    Birth control/protection: Condom

## 2021-05-09 ENCOUNTER — Other Ambulatory Visit: Payer: Self-pay | Admitting: Radiation Oncology

## 2021-05-09 DIAGNOSIS — C801 Malignant (primary) neoplasm, unspecified: Secondary | ICD-10-CM

## 2021-05-09 MED ORDER — TAMSULOSIN HCL 0.4 MG PO CAPS: 0.4000 mg | ORAL_CAPSULE | Freq: Every day | ORAL | 2 refills | Status: DC

## 2021-05-20 ENCOUNTER — Ambulatory Visit: Payer: Medicare Other | Admitting: Physical Medicine and Rehabilitation

## 2021-05-20 ENCOUNTER — Ambulatory Visit: Payer: Medicare Other | Admitting: Family

## 2021-05-23 ENCOUNTER — Encounter: Payer: Self-pay | Admitting: Physical Medicine and Rehabilitation

## 2021-05-23 ENCOUNTER — Ambulatory Visit (INDEPENDENT_AMBULATORY_CARE_PROVIDER_SITE_OTHER): Payer: Medicare Other | Admitting: Physical Medicine and Rehabilitation

## 2021-05-23 ENCOUNTER — Ambulatory Visit: Payer: Self-pay

## 2021-05-23 ENCOUNTER — Other Ambulatory Visit: Payer: Self-pay

## 2021-05-23 VITALS — BP 125/58 | HR 56

## 2021-05-23 DIAGNOSIS — M5416 Radiculopathy, lumbar region: Secondary | ICD-10-CM | POA: Diagnosis not present

## 2021-05-23 MED ORDER — METHYLPREDNISOLONE ACETATE 80 MG/ML IJ SUSP
80.0000 mg | Freq: Once | INTRAMUSCULAR | Status: AC
  Administered 2021-05-23: 80 mg

## 2021-05-23 NOTE — Patient Instructions (Signed)

## 2021-05-23 NOTE — Progress Notes (Signed)
Pt state lower back pain that travels down both legs. Pt state walking and bending over makes the pain worse. Pt state he takes over the counter pain meds to help ease his pain.  Numeric Pain Rating Scale and Functional Assessment Average Pain 6   In the last MONTH (on 0-10 scale) has pain interfered with the following?  1. General activity like being  able to carry out your everyday physical activities such as walking, climbing stairs, carrying groceries, or moving a chair?  Rating(10)   +Driver, -BT, -Dye Allergies.

## 2021-05-26 NOTE — Progress Notes (Signed)
Martin [name] - 60 y.o. male MRN [id number]  Date of birth: 1961-09-02  Office Visit Note: Visit Date: 05/23/2021 PCP: Patient, No Pcp Per (Inactive) Referred by: Leandrew Koyanagi, MD  Subjective: Chief Complaint  Patient presents with   Lower Back - Pain   Left Leg - Pain   Right Leg - Pain   HPI:  Martin [name] is a 60 y.o. male who comes in today at the request of Dr. Eduard Roux for planned Bilateral L5-S1 Lumbar Transforaminal epidural steroid injection with fluoroscopic guidance.  The patient has failed conservative care including home exercise, medications, time and activity modification.  This injection will be diagnostic and hopefully therapeutic.  Please see requesting physician notes for further details and justification. MRI reviewed with images and spine model.  MRI reviewed in the note below.     ROS Otherwise per HPI.  Assessment & Plan: Visit Diagnoses:    ICD-10-CM   1. Lumbar radiculopathy  M54.16 XR C-ARM NO REPORT    Epidural Steroid injection    methylPREDNISolone acetate (DEPO-MEDROL) injection 80 mg      Plan: No additional findings.   Meds & Orders:  Meds ordered this encounter  Medications   methylPREDNISolone acetate (DEPO-MEDROL) injection 80 mg    Orders Placed This Encounter  Procedures   XR C-ARM NO REPORT   Epidural Steroid injection    Follow-up: Return if symptoms worsen or fail to improve.   Procedures: No procedures performed  Lumbosacral Transforaminal Epidural Steroid Injection - Sub-Pedicular Approach with Fluoroscopic Guidance  Patient: Martin [name]      Date of Birth: [birth year] MRN: [id number] PCP: Patient, No Pcp Per (Inactive)      Visit Date: 05/23/2021   Universal Protocol:    Date/Time: 05/23/2021  Consent Given By: the patient  Position: PRONE  Additional Comments: Vital signs were monitored before and after the procedure. Patient was prepped and draped in the usual sterile fashion. The correct patient,  procedure, and site was verified.   Injection Procedure Details:   Procedure diagnoses: Lumbar radiculopathy [M54.16]    Meds Administered:  Meds ordered this encounter  Medications   methylPREDNISolone acetate (DEPO-MEDROL) injection 80 mg    Laterality: Bilateral  Location/Site: L5  Needle:5.0 in., 22 ga.  Short bevel or Quincke spinal needle  Needle Placement: Transforaminal  Findings:    -Comments: Excellent flow of contrast along the nerve, nerve root and into the epidural space.  Procedure Details: After squaring off the end-plates to get a true AP view, the C-arm was positioned so that an oblique view of the foramen as noted above was visualized. The target area is just inferior to the "nose of the scotty dog" or sub pedicular. The soft tissues overlying this structure were infiltrated with 2-3 ml. of 1% Lidocaine without Epinephrine.  The spinal needle was inserted toward the target using a "trajectory" view along the fluoroscope beam.  Under AP and lateral visualization, the needle was advanced so it did not puncture dura and was located close the 6 O'Clock position of the pedical in AP tracterory. Biplanar projections were used to confirm position. Aspiration was confirmed to be negative for CSF and/or blood. A 1-2 ml. volume of Isovue-250 was injected and flow of contrast was noted at each level. Radiographs were obtained for documentation purposes.   After attaining the desired flow of contrast documented above, a 0.5 to 1.0 ml test dose of 0.25% Marcaine was injected into each respective transforaminal space.  The patient was observed for 90 seconds post injection.  After no sensory deficits were reported, and normal lower extremity motor function was noted,   the above injectate was administered so that equal amounts of the injectate were placed at each foramen (level) into the transforaminal epidural space.   Additional Comments:  The patient tolerated the procedure  well Dressing: 2 x 2 sterile gauze and Band-Aid    Post-procedure details: Patient was observed during the procedure. Post-procedure instructions were reviewed.  Patient left the clinic in stable condition.    Clinical History: MRI LUMBAR SPINE WITHOUT CONTRAST  TECHNIQUE: Multiplanar, multisequence MR imaging of the lumbar spine was performed. No intravenous contrast was administered.  COMPARISON: None.  FINDINGS: Segmentation: Standard.  Alignment: Physiologic.  Vertebrae: No fracture, evidence of discitis, or bone lesion.  Conus medullaris and cauda equina: Conus extends to the T12-L1 level. Conus and cauda equina appear normal.  Paraspinal and other soft tissues: No acute paraspinal abnormality.  Disc levels:  Disc spaces: Degenerative disease with disc height loss at L1-2, L2-3, L3-4 and L5-S1.  T11-12: Mild broad-based disc bulge. No foraminal or central canal stenosis.  T12-L1: Minimal broad-based disc bulge. No evidence of neural foraminal stenosis. No central canal stenosis.  L1-L2: Mild broad-based disc bulge. No evidence of neural foraminal stenosis. No central canal stenosis.  L2-L3: Mild broad-based disc bulge flattening the ventral thecal sac. Bilateral lateral recess stenosis. Mild spinal stenosis. No foraminal stenosis.  L3-L4: Mild broad-based disc bulge. Mild bilateral facet arthropathy. Bilateral lateral recess stenosis. Mild spinal stenosis. No evidence of neural foraminal stenosis.  L4-L5: Broad-based disc bulge with a right paracentral/foraminal annular fissure and shallow disc protrusion. Bilateral lateral recess stenosis. Moderate left and mild right facet arthropathy. Mild spinal stenosis. No foraminal stenosis.  L5-S1: Broad-based disc bulge with a small central disc protrusion. Mild bilateral facet arthropathy. No evidence of neural foraminal stenosis. No central canal stenosis.  IMPRESSION: 1. Diffuse lumbar spine spondylosis  as described above. 2. No acute osseous injury of the lumbar spine.   Electronically Signed By: Kathreen Devoid On: 11/20/2020 11:47     Objective:  VS:  HT:    WT:   BMI:     BP:(!) 125/58  HR:(!) 56bpm  TEMP: ( )  RESP:  Physical Exam Vitals and nursing note reviewed.  Constitutional:      General: He is not in acute distress.    Appearance: Normal appearance. He is not ill-appearing.  HENT:     Head: Normocephalic and atraumatic.     Right Ear: External ear normal.     Left Ear: External ear normal.     Nose: No congestion.  Eyes:     Extraocular Movements: Extraocular movements intact.  Cardiovascular:     Rate and Rhythm: Normal rate.     Pulses: Normal pulses.  Pulmonary:     Effort: Pulmonary effort is normal. No respiratory distress.  Abdominal:     General: There is no distension.     Palpations: Abdomen is soft.  Musculoskeletal:        General: No tenderness or signs of injury.     Cervical back: Neck supple.     Right lower leg: No edema.     Left lower leg: No edema.     Comments: Patient has good distal strength without clonus.  Skin:    Findings: No erythema or rash.  Neurological:     General: No focal deficit present.     Mental  Status: He is alert and oriented to person, place, and time.     Sensory: No sensory deficit.     Motor: No weakness or abnormal muscle tone.     Coordination: Coordination normal.  Psychiatric:        Mood and Affect: Mood normal.        Behavior: Behavior normal.     Imaging: No results found.

## 2021-05-26 NOTE — Procedures (Signed)
Lumbosacral Transforaminal Epidural Steroid Injection - Sub-Pedicular Approach with Fluoroscopic Guidance  Patient: Martin [name]      Date of Birth: 1961/08/21 MRN: [id number] PCP: Patient, No Pcp Per (Inactive)      Visit Date: 05/23/2021   Universal Protocol:    Date/Time: 05/23/2021  Consent Given By: the patient  Position: PRONE  Additional Comments: Vital signs were monitored before and after the procedure. Patient was prepped and draped in the usual sterile fashion. The correct patient, procedure, and site was verified.   Injection Procedure Details:   Procedure diagnoses: Lumbar radiculopathy [M54.16]    Meds Administered:  Meds ordered this encounter  Medications   methylPREDNISolone acetate (DEPO-MEDROL) injection 80 mg    Laterality: Bilateral  Location/Site: L5  Needle:5.0 in., 22 ga.  Short bevel or Quincke spinal needle  Needle Placement: Transforaminal  Findings:    -Comments: Excellent flow of contrast along the nerve, nerve root and into the epidural space.  Procedure Details: After squaring off the end-plates to get a true AP view, the C-arm was positioned so that an oblique view of the foramen as noted above was visualized. The target area is just inferior to the "nose of the scotty dog" or sub pedicular. The soft tissues overlying this structure were infiltrated with 2-3 ml. of 1% Lidocaine without Epinephrine.  The spinal needle was inserted toward the target using a "trajectory" view along the fluoroscope beam.  Under AP and lateral visualization, the needle was advanced so it did not puncture dura and was located close the 6 O'Clock position of the pedical in AP tracterory. Biplanar projections were used to confirm position. Aspiration was confirmed to be negative for CSF and/or blood. A 1-2 ml. volume of Isovue-250 was injected and flow of contrast was noted at each level. Radiographs were obtained for documentation purposes.   After attaining  the desired flow of contrast documented above, a 0.5 to 1.0 ml test dose of 0.25% Marcaine was injected into each respective transforaminal space.  The patient was observed for 90 seconds post injection.  After no sensory deficits were reported, and normal lower extremity motor function was noted,   the above injectate was administered so that equal amounts of the injectate were placed at each foramen (level) into the transforaminal epidural space.   Additional Comments:  The patient tolerated the procedure well Dressing: 2 x 2 sterile gauze and Band-Aid    Post-procedure details: Patient was observed during the procedure. Post-procedure instructions were reviewed.  Patient left the clinic in stable condition.

## 2021-05-27 ENCOUNTER — Ambulatory Visit: Payer: Medicare Other | Admitting: Family

## 2021-05-31 ENCOUNTER — Ambulatory Visit (INDEPENDENT_AMBULATORY_CARE_PROVIDER_SITE_OTHER): Payer: Medicare Other | Admitting: Orthopaedic Surgery

## 2021-05-31 ENCOUNTER — Ambulatory Visit: Payer: Medicare Other | Admitting: Specialist

## 2021-05-31 ENCOUNTER — Other Ambulatory Visit: Payer: Self-pay

## 2021-05-31 ENCOUNTER — Encounter: Payer: Self-pay | Admitting: Orthopaedic Surgery

## 2021-05-31 VITALS — BP 147/55 | HR 53 | Ht 71.0 in | Wt 145.0 lb

## 2021-05-31 DIAGNOSIS — M47816 Spondylosis without myelopathy or radiculopathy, lumbar region: Secondary | ICD-10-CM

## 2021-05-31 NOTE — Progress Notes (Signed)
Office Visit Note   Patient: Martin [name]           Date of Birth: 1960-11-25           MRN: [id number] Visit Date: 05/31/2021              Requested by: No referring provider defined for this encounter. PCP: Patient, No Pcp Per (Inactive)   Assessment & Plan: Visit Diagnoses:  1. Spondylosis of lumbar region without myelopathy or radiculopathy     Plan: Reviewed his MRI scan he does have a mild bulge upper lumbar also at L5-S1 where he had the epidural injections.  He states he only got relief for few days and then recurrence of symptoms.  We will check some arterial Dopplers to rule out arterial occlusion.  If these are negative we could try repeating the epidural injections.  I reviewed the MRI scan with him he does not have any significant areas of lateral recess central or foraminal compression.  Mild facet arthropathy.  Follow-up after Dopplers.  Follow-Up Instructions: No follow-ups on file.   Orders:  No orders of the defined types were placed in this encounter.  No orders of the defined types were placed in this encounter.     Procedures: No procedures performed   Clinical Data: No additional findings.   Subjective: Chief Complaint  Patient presents with   Right Leg - Pain   Left Leg - Pain   Lower Back - Pain    HPI 60 year old male with claudication symptoms.  He saw Dr. Kathyrn Sheriff and had an MRI scan.  No significant areas of compression were seen although it did have some small disc bulges.  He was referred to see Dr. Sherrian Divers thinking it might be his hips hips look good on x-ray and he has good range of motion of his hips.  He describes claudication symptoms after he walks about a block.  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  He gets relief when he sits.  He is disabled but does a little bit of mild job for the [employer] where he lives.  Review of  Systems negative for bowel bladder associated symptoms.   Objective: Vital Signs: BP (!) 147/55   Pulse (!) 53   Ht 5' 11"  (1.803 m)   Wt 145 lb (65.8 kg)   BMI 20.22 kg/m   Physical Exam Constitutional:      Appearance: He is well-developed.  HENT:     Head: Normocephalic and atraumatic.     Right Ear: External ear normal.     Left Ear: External ear normal.  Eyes:     Pupils: Pupils are equal, round, and reactive to light.  Neck:     Thyroid: No thyromegaly.     Trachea: No tracheal deviation.  Cardiovascular:     Rate and Rhythm: Normal rate.  Pulmonary:     Effort: Pulmonary effort is normal.     Breath sounds: No wheezing.  Abdominal:     General: Bowel sounds are normal.     Palpations: Abdomen is soft.  Musculoskeletal:     Cervical back: Neck supple.  Skin:    General: Skin is warm and dry.     Capillary Refill: Capillary refill takes less than 2 seconds.  Neurological:     Mental Status: He is alert and oriented to person, place, and time.  Psychiatric:        Behavior: Behavior normal.        Thought Content: Thought content normal.        Judgment: Judgment normal.    Ortho Exam patient is DP 2+ both feet no posterior tibial pulse.  Feet are not cold.  Negative straight leg raising 90 degrees no sciatic notch tenderness.  Quads are strong.  Specialty Comments:  No specialty comments available.  Imaging: No results found.   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   Constipation 04/11/2017   Bee sting 04/05/2017   Diverticulosis 11/29/2014   Healthcare maintenance 11/06/2014   Cough 10/05/2014   Insomnia  09/13/2014   Erectile dysfunction 11/30/2013   Chronic pain syndrome 11/30/2013   Depression 11/30/2013   GERD (gastroesophageal reflux disease)    Hypertension    Anxiety    Arthritis    Osteoarthritis of lumbar spine    Radiation    Night sweats 08/12/2012   Tobacco abuse counseling 05/26/2012   Lymphocytic colitis 05/03/2012   B12 nutritional deficiency 03/19/2012   Folate deficiency 03/19/2012   Inguinal hernia 11/24/2011   Cancer ([place]) 10/14/2011   Internal hemorrhoid 09/11/2011   Anal cancer ([place]) 10/09/2010   Past Medical History:  Diagnosis Date   Allergy 2 2013   Anal cancer ([place]) 10/14/11   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    Anxiety    Arthritis    DDD lumbar, arthritis knees   DJD (degenerative joint disease) of lumbar spine    GERD (gastroesophageal reflux disease)    Hemorrhoid    internal   History of bowel resection    Hypertension    EKG 12/12 EPIC   no  PCP- states increased lately but hasnt been diagnosed formally   Inguinal hernia    Pneumonia    as child, cough at present with no fever   Pneumonia    as child, cough at present with no fever    Radiation 11/19/11-01/08/12   5040 cGy 28 fx Pelvis and inguinal area    Family History  Problem Relation Age of Onset   Colon cancer Father    Asthma Father    Cancer Father        prostate   Hypertension Father    Asthma Mother    Hypertension Mother    Stomach cancer Neg Hx    Esophageal cancer Neg Hx     Past Surgical History:  Procedure Laterality Date   APPENDECTOMY     age 50 or 86   EXAMINATION UNDER ANESTHESIA  10/14/2011   Procedure: EXAM UNDER ANESTHESIA;  Surgeon: Earnstine Regal, MD;  Location: [facility];  Service: General;  Laterality: N/A;  exam under anethesia, Excision of mass anal canal, 1.5cm   INGUINAL HERNIA REPAIR  05/11/2012   Procedure: HERNIA REPAIR INGUINAL ADULT;  Surgeon: Earnstine Regal, MD;  Location: [facility];  Service: General;  Laterality: Left;  left  inguinal hernia repair with mesh   surgical pathology   10/14/2011   squamous cell ca of anus   transanal excision  10/14/2011   Dr.Todd Gerkin   Social History  Occupational History   Occupation: unemployed  Tobacco Use   Smoking status: Every Day    Packs/day: 1.25    Years: 47.00    Pack years: 58.75    Types: Cigarettes   Smokeless tobacco: Never   Tobacco comments:    Cutting back>> Quit Smart card supplied  Substance and Sexual Activity   Alcohol use: Not Currently    Alcohol/week: 6.0 standard drinks    Types: 6 Cans of beer per week    Comment: weekend    6pk weekend, beer    Drug use: Not Currently    Types: Marijuana, Cocaine    Comment: marijuana in past, occasionally now-last use 3-4 weeks ago   Sexual activity: Yes    Birth control/protection: Condom

## 2021-05-31 NOTE — Addendum Note (Signed)
Addended by: Meyer Cory on: 05/31/2021 03:57 PM   Modules accepted: Orders

## 2021-06-03 ENCOUNTER — Telehealth: Payer: Self-pay | Admitting: *Deleted

## 2021-06-03 NOTE — Telephone Encounter (Signed)
Pt is scheduled for LE Arterial Duplex on Tues Sept 27 at 1pm at [facility], IC pt and informed him of the appt.

## 2021-06-04 ENCOUNTER — Ambulatory Visit (HOSPITAL_COMMUNITY)
Admission: RE | Admit: 2021-06-04 | Discharge: 2021-06-04 | Disposition: A | Discharge status: Home / Self Care | Payer: Medicare Other | Source: Ambulatory Visit | Attending: Orthopaedic Surgery | Admitting: Orthopaedic Surgery

## 2021-06-04 ENCOUNTER — Other Ambulatory Visit: Payer: Self-pay

## 2021-06-04 ENCOUNTER — Other Ambulatory Visit: Payer: Self-pay | Admitting: Orthopaedic Surgery

## 2021-06-04 DIAGNOSIS — M79606 Pain in leg, unspecified: Secondary | ICD-10-CM | POA: Diagnosis present

## 2021-06-05 ENCOUNTER — Other Ambulatory Visit: Payer: Self-pay | Admitting: Radiology

## 2021-06-05 DIAGNOSIS — M47816 Spondylosis without myelopathy or radiculopathy, lumbar region: Secondary | ICD-10-CM

## 2021-06-06 ENCOUNTER — Other Ambulatory Visit: Payer: Self-pay

## 2021-06-06 ENCOUNTER — Encounter: Payer: Self-pay | Admitting: Family

## 2021-06-06 ENCOUNTER — Ambulatory Visit (INDEPENDENT_AMBULATORY_CARE_PROVIDER_SITE_OTHER): Payer: Medicare Other | Admitting: Family

## 2021-06-06 ENCOUNTER — Other Ambulatory Visit (HOSPITAL_COMMUNITY): Payer: Self-pay

## 2021-06-06 VITALS — BP 135/70 | HR 68 | Temp 98.1°F | Resp 18 | Ht 70.98 in | Wt 135.2 lb

## 2021-06-06 DIAGNOSIS — Z130 Encounter for screening for diseases of the blood and blood-forming organs and certain disorders involving the immune mechanism: Secondary | ICD-10-CM

## 2021-06-06 DIAGNOSIS — Z0000 Encounter for general adult medical examination without abnormal findings: Secondary | ICD-10-CM

## 2021-06-06 DIAGNOSIS — Z131 Encounter for screening for diabetes mellitus: Secondary | ICD-10-CM | POA: Diagnosis not present

## 2021-06-06 DIAGNOSIS — Z1211 Encounter for screening for malignant neoplasm of colon: Secondary | ICD-10-CM

## 2021-06-06 DIAGNOSIS — Z122 Encounter for screening for malignant neoplasm of respiratory organs: Secondary | ICD-10-CM

## 2021-06-06 DIAGNOSIS — Z13228 Encounter for screening for other metabolic disorders: Secondary | ICD-10-CM

## 2021-06-06 DIAGNOSIS — Z13220 Encounter for screening for lipoid disorders: Secondary | ICD-10-CM

## 2021-06-06 DIAGNOSIS — L84 Corns and callosities: Secondary | ICD-10-CM

## 2021-06-06 DIAGNOSIS — Z1321 Encounter for screening for nutritional disorder: Secondary | ICD-10-CM

## 2021-06-06 DIAGNOSIS — Z0100 Encounter for examination of eyes and vision without abnormal findings: Secondary | ICD-10-CM

## 2021-06-06 DIAGNOSIS — Z1329 Encounter for screening for other suspected endocrine disorder: Secondary | ICD-10-CM

## 2021-06-06 DIAGNOSIS — Z0001 Encounter for general adult medical examination with abnormal findings: Secondary | ICD-10-CM

## 2021-06-06 DIAGNOSIS — Z716 Tobacco abuse counseling: Secondary | ICD-10-CM

## 2021-06-06 MED ORDER — NICOTINE 14 MG/24HR TD PT24
14.0000 mg | MEDICATED_PATCH | Freq: Every day | TRANSDERMAL | 3 refills | Status: DC
  Filled 2021-06-06: qty 28, 28d supply, fill #0

## 2021-06-06 NOTE — Progress Notes (Signed)
Subjective:   Martin [name] is a 60 y.o. male who presents for a Welcome to Medicare exam.   Review of Systems: Reports still seeing Orthopedics for lower extremity pain. Reports using a knife to cut away calluses on left foot. Requesting referral to nutritionist reporting he lives in the home alone eating frozen dinners often, and would like to eat healthier.   Cardiac Risk Factors include: advanced age (>90mn, >>42women);smoking/ tobacco exposure   Objective:    Today's Vitals   06/06/21 1023 06/06/21 1122  BP: (!) 163/70 135/70  Pulse: 68   Resp: 18   Temp: 98.1 F (36.7 C)   SpO2: 96%   Weight: 135 lb 3.2 oz (61.3 kg)   Height: 5' 10.98" (1.803 m)   PainSc: 10-Worst pain ever    Body mass index is 18.87 kg/m.  Medications Outpatient Encounter Medications as of 06/06/2021  Medication Sig   albuterol (VENTOLIN HFA) 108 (90 Base) MCG/ACT inhaler Inhale 2 puffs into the lungs every 6 (six) hours as needed for wheezing or shortness of breath.   dextromethorphan-guaiFENesin (MUCINEX DM) 30-600 MG 12hr tablet Take 1 tablet by mouth 2 (two) times daily.   EPINEPHrine (EPIPEN 2-PAK) 0.3 mg/0.3 mL IJ SOAJ injection Inject 0.3 mLs (0.3 mg total) into the muscle as needed for anaphylaxis.   HYDROcodone bit-homatropine (HYCODAN) 5-1.5 MG/5ML syrup Take 5 mLs by mouth at bedtime as needed for cough.   omeprazole (PRILOSEC) 20 MG capsule Take 1 capsule (20 mg total) by mouth 2 (two) times daily before a meal for 15 days.   predniSONE (DELTASONE) 10 MG tablet Take 6 tabs day one, 5 tabs day two, 4 tabs day three, etc   tamsulosin (FLOMAX) 0.4 MG CAPS capsule Take 1 capsule (0.4 mg total) by mouth at bedtime.   umeclidinium-vilanterol (ANORO ELLIPTA) 62.5-25 MCG/INH AEPB Inhale 1 puff into the lungs daily.   [DISCONTINUED] sucralfate (CARAFATE) 1 GM/10ML suspension Take 10 mLs (1 g total) by mouth 4 (four) times daily -  with meals and at bedtime. (Patient not taking: Reported on  11/28/2019)   No facility-administered encounter medications on file as of 06/06/2021.     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    Anxiety    Arthritis    DDD lumbar, arthritis knees   DJD (degenerative joint disease) of lumbar spine    GERD (gastroesophageal reflux disease)    Hemorrhoid    internal   History of bowel resection    Hypertension    EKG 12/12 EPIC   no  PCP- states increased lately but hasnt been diagnosed formally   Inguinal hernia    Pneumonia    as child, cough at present with no fever   Pneumonia    as child, cough at present with no fever    Radiation 11/19/11-01/08/12   5040 cGy 28 fx Pelvis and inguinal area   Past Surgical History:  Procedure Laterality Date   APPENDECTOMY     [age]  EXAMINATION UNDER ANESTHESIA  10/14/2011   Procedure: EXAM UNDER ANESTHESIA;  Surgeon: TEarnstine Regal MD;  Location: [facility];  Service: General;  Laterality: N/A;  exam under anethesia, Excision of mass anal canal, 1.5cm   INGUINAL HERNIA REPAIR  05/11/2012   Procedure: HERNIA REPAIR INGUINAL ADULT;  Surgeon: TEarnstine Regal MD;  Location: [facility]  [place];  Service: General;  Laterality: Left;  left inguinal hernia repair with mesh   surgical pathology   10/14/2011   squamous cell ca of anus   transanal excision  10/14/2011   Dr.Todd Gerkin    Family History  Problem Relation Age of Onset   Colon cancer Father    Asthma Father    Cancer Father        prostate   Hypertension Father    Asthma Mother    Hypertension Mother    Stomach cancer Neg Hx    Esophageal cancer Neg Hx    Social History   Occupational History   Occupation: unemployed  Tobacco Use   Smoking status: Every Day    Packs/day: 1.25    Years: 47.00    Pack years: 58.75    Types: Cigarettes   Smokeless tobacco: Never   Tobacco comments:    Clinical biochemist card supplied   Substance and Sexual Activity   Alcohol use: Not Currently    Alcohol/week: 6.0 standard drinks    Types: 6 Cans of beer per week    Comment: weekend    6pk weekend, beer    Drug use: Not Currently    Types: Marijuana, Cocaine    Comment: marijuana in past, occasionally now-last use 3-4 weeks ago   Sexual activity: Yes    Birth control/protection: Condom   Tobacco Counseling Reports smoking cigarettes since 60 years-old. Currently smoking 1 pack daily. He is ready to quit. Tried Nicoderm patch in the past and helped, would like to try again.   Immunizations and Health Maintenance Immunization History  Administered Date(s) Administered   Tdap 11/06/2014   Tetanus 09/08/2002   Health Maintenance Due  Topic Date Due   COVID-19 Vaccine (1) Never done   Zoster Vaccines- Shingrix (1 of 2) Never done   COLONOSCOPY (Pts 45-39yr Insurance coverage will need to be confirmed)  11/27/2019   INFLUENZA VACCINE  Never done    Activities of Daily Living In your present state of health, do you have any difficulty performing the following activities: 06/06/2021  Hearing? N  Vision? N  Difficulty concentrating or making decisions? N  Walking or climbing stairs? N  Dressing or bathing? N  Doing errands, shopping? N  Preparing Food and eating ? N  Using the Toilet? N  In the past six months, have you accidently leaked urine? N  Do you have problems with loss of bowel control? N  Managing your Medications? N  Managing your Finances? N  Housekeeping or managing your Housekeeping? N  Some recent data might be hidden    Physical Exam (optional), or other factors deemed appropriate based on the beneficiary's medical and social history and current clinical standards.  Physical Exam HENT:     Head: Normocephalic and atraumatic.  Eyes:     Extraocular Movements: Extraocular movements intact.     Conjunctiva/sclera: Conjunctivae normal.     Pupils: Pupils are equal, round, and reactive to  light.  Cardiovascular:     Rate and Rhythm: Normal rate and regular rhythm.     Pulses: Normal pulses.     Heart sounds: Normal heart sounds.  Pulmonary:     Effort: Pulmonary effort is normal.     Breath sounds: Normal breath sounds. No stridor.  Abdominal:     General: Bowel sounds are normal.     Palpations: Abdomen is soft.  Musculoskeletal:     Cervical back: Normal range of  motion and neck supple.  Skin:    General: Skin is warm and dry.     Capillary Refill: Capillary refill takes less than 2 seconds.  Neurological:     General: No focal deficit present.     Mental Status: He is alert and oriented to person, place, and time.  Psychiatric:        Mood and Affect: Mood normal.        Behavior: Behavior normal.    Advanced Directives: Does Patient Have a Medical Advance Directive?: No Would patient like information on creating a medical advance directive?: Yes (Inpatient - patient defers creating a medical advance directive at this time - Information given)     Assessment:    This is a routine wellness  examination for this patient .   Vision/Hearing screen A few years ago had eye exam. Can hear well.   Dietary issues and exercise activities discussed:  Current Exercise Habits: The patient does not participate in regular exercise at present, Exercise limited by: cardiac condition(s);orthopedic condition(s)  Goals: "To get my strength back."  Depression Screen PHQ 2/9 Scores 06/06/2021 01/15/2017 07/05/2015 11/02/2014  PHQ - 2 Score 0 0 0 0  PHQ- 9 Score - - - -    Fall Risk Fall Risk  08/03/2017  Falls in the past year? No    Patient Care Team: Patient, No Pcp Per (Inactive) as PCP - General (General Practice) Armandina Gemma, MD (General Surgery) Diona Browner, DMD (Dentistry)    Plan:  1. Medicare welcome visit: - Counseled on 150 minutes of exercise per week as tolerated, healthy eating (including decreased daily intake of saturated fats, cholesterol,  added sugars, sodium), STI prevention, and routine healthcare maintenance. - Per patient request referral to Medical Nutrition Therapy for further counseling on balanced diet.  - Amb ref to Medical Nutrition Therapy-MNT  2. Screening for metabolic disorder: - PPI95+JOAC to check kidney function, liver function, and electrolyte balance.  - CMP14+EGFR  3. Screening for deficiency anemia: - CBC to screen for anemia. - CBC  4. Diabetes mellitus screening: - Hemoglobin A1c to screen for pre-diabetes/diabetes. - Hemoglobin A1c  5. Screening cholesterol level: - Lipid panel to screen for high cholesterol.  - Lipid panel  6. Thyroid disorder screen: - TSH to check thyroid function.  - TSH  7. Encounter for vitamin deficiency screening: - Screening for vitamin D deficiency. - Vitamin D, 25-hydroxy  8. Routine eye exam: - Referral to Ophthalmology for further evaluation and management.  - Ambulatory referral to Ophthalmology  9. Screening for lung cancer: - CT chest for lung cancer screening.  - CT CHEST LUNG CA SCREEN LOW DOSE W/O CM; Future  10. Encounter for smoking cessation counseling: - Counseled to quit.  - Begin Nicotine as prescribed. Medication compliance and adverse effects discussed with patient.  - Follow-up with primary provider in 4 weeks or sooner if needed.  - nicotine (NICODERM CQ) 14 mg/24hr patch; Place 1 patch (14 mg total) onto the skin daily.  Dispense: 28 patch; Refill: 3  11. Colon cancer screening: - Referral to Gastroenterology for colon cancer screening by colonoscopy. - Ambulatory referral to Gastroenterology  12. Callus of foot: - Per patient preference referral to Podiatry for further evaluation and management.  - Ambulatory referral to Podiatry   I have personally reviewed and noted the following in the patient's chart:   Medical and social history Use of alcohol, tobacco or illicit drugs  Current medications and supplements Functional  ability  and status Nutritional status Physical activity Advanced directives List of other physicians Hospitalizations, surgeries, and ER visits in previous 12 months Vitals Screenings to include cognitive, depression, and falls Referrals and appointments  In addition, I have reviewed and discussed with patient certain preventive protocols, quality metrics, and best practice recommendations. A written personalized care plan for preventive services as well as general preventive health recommendations were provided to patient.   Camillia Herter, NP 06/06/2021

## 2021-06-06 NOTE — Progress Notes (Signed)
Pt presents for Welcome to Medicare visit

## 2021-06-07 ENCOUNTER — Other Ambulatory Visit (HOSPITAL_COMMUNITY): Payer: Self-pay

## 2021-06-07 ENCOUNTER — Other Ambulatory Visit: Payer: Self-pay | Admitting: Family

## 2021-06-07 DIAGNOSIS — E785 Hyperlipidemia, unspecified: Secondary | ICD-10-CM | POA: Insufficient documentation

## 2021-06-07 DIAGNOSIS — Z130 Encounter for screening for diseases of the blood and blood-forming organs and certain disorders involving the immune mechanism: Secondary | ICD-10-CM

## 2021-06-07 LAB — TSH: TSH: 1.24 u[IU]/mL (ref 0.450–4.500)

## 2021-06-07 LAB — CMP14+EGFR
ALT: 24 IU/L (ref 0–44)
AST: 28 IU/L (ref 0–40)
Albumin/Globulin Ratio: 2 (ref 1.2–2.2)
Albumin: 4.3 g/dL (ref 3.8–4.8)
Alkaline Phosphatase: 108 IU/L (ref 44–121)
BUN/Creatinine Ratio: 15 (ref 10–24)
BUN: 12 mg/dL (ref 8–27)
Bilirubin Total: 0.5 mg/dL (ref 0.0–1.2)
CO2: 23 mmol/L (ref 20–29)
Calcium: 9.2 mg/dL (ref 8.6–10.2)
Chloride: 102 mmol/L (ref 96–106)
Creatinine, Ser: 0.81 mg/dL (ref 0.76–1.27)
Globulin, Total: 2.2 g/dL (ref 1.5–4.5)
Glucose: 83 mg/dL (ref 70–99)
Potassium: 5 mmol/L (ref 3.5–5.2)
Sodium: 138 mmol/L (ref 134–144)
Total Protein: 6.5 g/dL (ref 6.0–8.5)
eGFR: 100 mL/min/{1.73_m2} (ref >59)

## 2021-06-07 LAB — VITAMIN D 25 HYDROXY (VIT D DEFICIENCY, FRACTURES): Vit D, 25-Hydroxy: 30.8 ng/mL (ref 30.0–100.0)

## 2021-06-07 LAB — LIPID PANEL
Chol/HDL Ratio: 3.4 ratio (ref 0.0–5.0)
Cholesterol, Total: 165 mg/dL (ref 100–199)
HDL: 48 mg/dL (ref >39)
LDL Chol Calc (NIH): 101 mg/dL — ABNORMAL HIGH (ref 0–99)
Triglycerides: 84 mg/dL (ref 0–149)
VLDL Cholesterol Cal: 16 mg/dL (ref 5–40)

## 2021-06-07 LAB — CBC
Hematocrit: 36.9 % — ABNORMAL LOW (ref 37.5–51.0)
Hemoglobin: 13.1 g/dL (ref 13.0–17.7)
MCH: 32 pg (ref 26.6–33.0)
MCHC: 35.5 g/dL (ref 31.5–35.7)
MCV: 90 fL (ref 79–97)
Platelets: 163 10*3/uL (ref 150–450)
RBC: 4.1 x10E6/uL — ABNORMAL LOW (ref 4.14–5.80)
RDW: 13.6 % (ref 11.6–15.4)
WBC: 8.5 10*3/uL (ref 3.4–10.8)

## 2021-06-07 LAB — HEMOGLOBIN A1C
Est. average glucose Bld gHb Est-mCnc: 97 mg/dL
Hgb A1c MFr Bld: 5 % (ref 4.8–5.6)

## 2021-06-07 MED ORDER — ATORVASTATIN CALCIUM 40 MG PO TABS
40.0000 mg | ORAL_TABLET | Freq: Every day | ORAL | 0 refills | Status: DC
  Filled 2021-06-07: qty 90, 90d supply, fill #0

## 2021-06-07 NOTE — Progress Notes (Signed)
Please call patient with update.   Kidney function normal.   Liver function normal.   Thyroid function normal.   No diabetes.  Vitamin D normal.   Adding iron panel to further screen for anemia.   Cholesterol higher than expected. High cholesterol may increase risk of heart attack and/or stroke. Consider eating more fruits, vegetables, and lean baked meats such as chicken or fish. Moderate intensity exercise at least 150 minutes as tolerated per week may help as well. Begin Atorvastatin (Lipitor) for high cholesterol. Encouraged to recheck cholesterol in 6 to 8 weeks.  The following is for provider reference only: The 10-year ASCVD risk score (Arnett DK, et al., 2019) is: 14.5%   Values used to calculate the score:     Age: 60 years     Sex: Male     Is Non-Hispanic African American: No     Diabetic: No     Tobacco smoker: Yes     Systolic Blood Pressure: 360 mmHg     Is BP treated: No     HDL Cholesterol: 48 mg/dL     Total Cholesterol: 165 mg/dL

## 2021-06-12 ENCOUNTER — Ambulatory Visit (INDEPENDENT_AMBULATORY_CARE_PROVIDER_SITE_OTHER): Payer: Medicare Other | Admitting: Podiatry

## 2021-06-12 ENCOUNTER — Encounter: Payer: Self-pay | Admitting: Podiatry

## 2021-06-12 ENCOUNTER — Other Ambulatory Visit: Payer: Self-pay

## 2021-06-12 DIAGNOSIS — L84 Corns and callosities: Secondary | ICD-10-CM

## 2021-06-12 DIAGNOSIS — M2010 Hallux valgus (acquired), unspecified foot: Secondary | ICD-10-CM

## 2021-06-12 DIAGNOSIS — M2041 Other hammer toe(s) (acquired), right foot: Secondary | ICD-10-CM

## 2021-06-12 DIAGNOSIS — M2042 Other hammer toe(s) (acquired), left foot: Secondary | ICD-10-CM

## 2021-06-12 DIAGNOSIS — I70219 Atherosclerosis of native arteries of extremities with intermittent claudication, unspecified extremity: Secondary | ICD-10-CM | POA: Insufficient documentation

## 2021-06-12 NOTE — Progress Notes (Signed)
MRN : [id number]  Martin [name] is a 60 y.o. (30-Oct-1960) male who presents with chief complaint of check circulation.  History of Present Illness:  The patient is seen for evaluation of painful lower extremities and diminished pulses. Patient notes the pain is always associated with activity and is very consistent day today. Typically, the pain occurs at less than one block, progress is as activity continues to the point that the patient must stop walking. Resting including standing still for several minutes allowed resumption of the activity and the ability to walk a similar distance before stopping again. Uneven terrain and inclined shorten the distance. The pain has been progressive over the past several years. The patient states the inability to walk is now having a profound negative impact on quality of life and daily activities.  The patient denies rest pain or dangling of an extremity off the side of the bed during the night for relief. No open wounds or sores at this time. No prior interventions or surgeries.  No history of back problems or DJD of the lumbar sacral spine.   The patient denies changes in claudication symptoms or new rest pain symptoms.  No new ulcers or wounds of the foot.  The patient's blood pressure has been stable and relatively well controlled. The patient denies amaurosis fugax or recent TIA symptoms. There are no recent neurological changes noted. The patient denies history of DVT, PE or superficial thrombophlebitis. The patient denies recent episodes of angina or shortness of breath.   ABIs obtained 06/04/2021 show Rt=0.73 and Lt=0.78 (monophasic signals bilaterally)   No outpatient medications have been marked as taking for the 06/13/21 encounter (Appointment) with Delana Meyer, Dolores Lory, MD.    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    Anxiety    Arthritis    DDD  lumbar, arthritis knees   DJD (degenerative joint disease) of lumbar spine    GERD (gastroesophageal reflux disease)    Hemorrhoid    internal   History of bowel resection    Hypertension    EKG 12/12 EPIC   no  PCP- states increased lately but hasnt been diagnosed formally   Inguinal hernia    Pneumonia    as child, cough at present with no fever   Pneumonia    as child, cough at present with no fever    Radiation 11/19/11-01/08/12   5040 cGy 28 fx Pelvis and inguinal area    Past Surgical History:  Procedure Laterality Date   APPENDECTOMY     age 66 or 56   EXAMINATION UNDER ANESTHESIA  10/14/2011   Procedure: EXAM UNDER ANESTHESIA;  Surgeon: Earnstine Regal, MD;  Location: [facility];  Service: General;  Laterality: N/A;  exam under anethesia, Excision of mass anal canal, 1.5cm   INGUINAL HERNIA REPAIR  05/11/2012   Procedure: HERNIA REPAIR INGUINAL ADULT;  Surgeon: Earnstine Regal, MD;  Location: [facility];  Service: General;  Laterality: Left;  left inguinal hernia repair with mesh   surgical pathology   10/14/2011   squamous cell ca of anus   transanal excision  10/14/2011   Dr.Todd Gerkin    Social History Social History   Tobacco Use   Smoking status: Every Day    Packs/day: 1.25    Years: 47.00    Pack years: 58.75    Types:  Cigarettes   Smokeless tobacco: Never   Tobacco comments:    Cutting back>> Quit Smart card supplied  Substance Use Topics   Alcohol use: Not Currently    Alcohol/week: 6.0 standard drinks    Types: 6 Cans of beer per week    Comment: weekend    6pk weekend, beer    Drug use: Not Currently    Types: Marijuana, Cocaine    Comment: marijuana in past, occasionally now-last use 3-4 weeks ago    Family History Family History  Problem Relation Age of Onset   Colon cancer Father    Asthma Father    Cancer Father        prostate   Hypertension Father    Asthma Mother    Hypertension Mother    Stomach cancer Neg Hx    Esophageal cancer  Neg Hx     Allergies  Allergen Reactions   Yellow Jacket Venom [Bee Venom] Anaphylaxis   Aspirin Swelling   Codeine Nausea Only   Morphine And Related Itching     REVIEW OF SYSTEMS (Negative unless checked)  Constitutional: [] Weight loss  [] Fever  [] Chills Cardiac: [] Chest pain   [] Chest pressure   [] Palpitations   [] Shortness of breath when laying flat   [] Shortness of breath with exertion. Vascular:  [x] Pain in legs with walking   [] Pain in legs at rest  [] History of DVT   [] Phlebitis   [] Swelling in legs   [] Varicose veins   [] Non-healing ulcers Pulmonary:   [] Uses home oxygen   [] Productive cough   [] Hemoptysis   [] Wheeze  [] COPD   [] Asthma Neurologic:  [] Dizziness   [] Seizures   [] History of stroke   [] History of TIA  [] Aphasia   [] Vissual changes   [] Weakness or numbness in arm   [] Weakness or numbness in leg Musculoskeletal:   [] Joint swelling   [] Joint pain   [x] Low back pain Hematologic:  [] Easy bruising  [] Easy bleeding   [] Hypercoagulable state   [] Anemic Gastrointestinal:  [] Diarrhea   [] Vomiting  [x] Gastroesophageal reflux/heartburn   [] Difficulty swallowing. Genitourinary:  [] Chronic kidney disease   [] Difficult urination  [] Frequent urination   [] Blood in urine Skin:  [] Rashes   [] Ulcers  Psychological:  [] History of anxiety   []  History of major depression.  Physical Examination  There were no vitals filed for this visit. There is no height or weight on file to calculate BMI. Gen: WD/WN, NAD Head: [place]/AT, No temporalis wasting.  Ear/Nose/Throat: Hearing grossly intact, nares w/o erythema or drainage Eyes: PER, EOMI, sclera nonicteric.  Neck: Supple, no masses.  No bruit or JVD.  Pulmonary:  Good air movement, no audible wheezing, no use of accessory muscles.  Cardiac: RRR, normal S1, S2, no Murmurs. Vascular:   Vessel Right Left  Radial Palpable Palpable  PT Not Palpable Not Palpable  DP Not Palpable Not Palpable  Gastrointestinal: soft, non-distended. No  guarding/no peritoneal signs.  Musculoskeletal: M/S 5/5 throughout.  No visible deformity.  Neurologic: CN 2-12 intact. Pain and light touch intact in extremities.  Symmetrical.  Speech is fluent. Motor exam as listed above. Psychiatric: Judgment intact, Mood & affect appropriate for pt's clinical situation. Dermatologic: No rashes or ulcers noted.  No changes consistent with cellulitis.   CBC Lab Results  Component Value Date   WBC 8.5 06/06/2021   HGB 13.1 06/06/2021   HCT 36.9 (L) 06/06/2021   MCV 90 06/06/2021   PLT 163 06/06/2021    BMET    Component Value Date/Time  NA 138 06/06/2021 1134   NA 141 10/19/2014 1204   K 5.0 06/06/2021 1134   K 4.6 10/19/2014 1204   CL 102 06/06/2021 1134   CO2 23 06/06/2021 1134   CO2 26 10/19/2014 1204   GLUCOSE 83 06/06/2021 1134   GLUCOSE 114 (H) 03/10/2020 1219   GLUCOSE 91 10/19/2014 1204   BUN 12 06/06/2021 1134   BUN 10.8 10/19/2014 1204   CREATININE 0.81 06/06/2021 1134   CREATININE 0.83 07/05/2015 1042   CREATININE 0.9 10/19/2014 1204   CALCIUM 9.2 06/06/2021 1134   CALCIUM 9.6 10/19/2014 1204   GFRNONAA >60 03/10/2020 1219   GFRNONAA >89 07/05/2015 1042   GFRAA >60 03/10/2020 1219   GFRAA >89 07/05/2015 1042   Estimated Creatinine Clearance: 82 mL/min (by C-G formula based on SCr of 0.81 mg/dL).  COAG No results found for: INR, PROTIME  Radiology Epidural Steroid injection  Result Date: 05/23/2021 Magnus Sinning, MD     05/26/2021  5:40 PM Lumbosacral Transforaminal Epidural Steroid Injection - Sub-Pedicular Approach with Fluoroscopic Guidance Patient: Martin [name]     Date of Birth: 03-23-1961 MRN: [id number] PCP: Patient, No Pcp Per (Inactive)     Visit Date: 05/23/2021  Universal Protocol:   Date/Time: 05/23/2021 Consent Given By: the patient Position: PRONE Additional Comments: Vital signs were monitored before and after the procedure. Patient was prepped and draped in the usual sterile fashion. The correct  patient, procedure, and site was verified. Injection Procedure Details: Procedure diagnoses: Lumbar radiculopathy [M54.16]  Meds Administered: Meds ordered this encounter Medications  methylPREDNISolone acetate (DEPO-MEDROL) injection 80 mg Laterality: Bilateral Location/Site: L5 Needle:5.0 in., 22 ga.  Short bevel or Quincke spinal needle Needle Placement: Transforaminal Findings:   -Comments: Excellent flow of contrast along the nerve, nerve root and into the epidural space. Procedure Details: After squaring off the end-plates to get a true AP view, the C-arm was positioned so that an oblique view of the foramen as noted above was visualized. The target area is just inferior to the "nose of the scotty dog" or sub pedicular. The soft tissues overlying this structure were infiltrated with 2-3 ml. of 1% Lidocaine without Epinephrine. The spinal needle was inserted toward the target using a "trajectory" view along the fluoroscope beam.  Under AP and lateral visualization, the needle was advanced so it did not puncture dura and was located close the 6 O'Clock position of the pedical in AP tracterory. Biplanar projections were used to confirm position. Aspiration was confirmed to be negative for CSF and/or blood. A 1-2 ml. volume of Isovue-250 was injected and flow of contrast was noted at each level. Radiographs were obtained for documentation purposes. After attaining the desired flow of contrast documented above, a 0.5 to 1.0 ml test dose of 0.25% Marcaine was injected into each respective transforaminal space.  The patient was observed for 90 seconds post injection.  After no sensory deficits were reported, and normal lower extremity motor function was noted,   the above injectate was administered so that equal amounts of the injectate were placed at each foramen (level) into the transforaminal epidural space. Additional Comments: The patient tolerated the procedure well Dressing: 2 x 2 sterile gauze and Band-Aid   Post-procedure details: Patient was observed during the procedure. Post-procedure instructions were reviewed. Patient left the clinic in stable condition.   VAS [place] ABI WITH/WO TBI  Result Date: 06/04/2021  LOWER EXTREMITY DOPPLER STUDY Patient Name:  Martin [name]  Date of Exam:   06/04/2021  Medical Rec #: [id number]        Accession #:    [id number] Date of Birth: Jan 27, 1961        Patient Gender: M Patient Age:   73 years Exam Location:  [facility] Procedure:      VAS [place] ABI WITH/WO TBI Referring Phys: MARK YATES --------------------------------------------------------------------------------  Indications: Claudication, and peripheral artery disease. High Risk Factors: Hypertension, current smoker.  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 Moderate focal stenosis                   involving the proximal portion of the right external iliac                   artery. Left common and external iliac arteries widely patent.                    No prior ABI studies. Performing Technologist: Darlin Coco RDMS RVT  Examination Guidelines: A complete evaluation includes at minimum, Doppler waveform signals and systolic blood pressure reading at the level of bilateral brachial, anterior tibial, and posterior tibial arteries, when vessel segments are accessible. Bilateral testing is considered an integral part of a complete examination. Photoelectric Plethysmograph (PPG) waveforms and toe systolic pressure readings are included as required and additional duplex testing as needed. Limited examinations for reoccurring indications may be performed as noted.  ABI Findings: +---------+------------------+-----+----------+--------+ Right    Rt Pressure (mmHg)IndexWaveform  Comment  +---------+------------------+-----+----------+--------+ Brachial 161                    triphasic           +---------+------------------+-----+----------+--------+ PTA      117               0.73 monophasic         +---------+------------------+-----+----------+--------+ DP       102               0.63 monophasic         +---------+------------------+-----+----------+--------+ Great Toe63                0.39 Abnormal           +---------+------------------+-----+----------+--------+ +---------+------------------+-----+----------+-------+ Left     Lt Pressure (mmHg)IndexWaveform  Comment +---------+------------------+-----+----------+-------+ Brachial 159                    triphasic         +---------+------------------+-----+----------+-------+ PTA      109               0.68 monophasic        +---------+------------------+-----+----------+-------+ DP       126               0.78 monophasic        +---------+------------------+-----+----------+-------+ Great Toe47                0.29 Abnormal          +---------+------------------+-----+----------+-------+ +-------+-----------+-----------+------------+------------+ ABI/TBIToday's ABIToday's TBIPrevious ABIPrevious TBI +-------+-----------+-----------+------------+------------+ Right  0.73       0.39                                +-------+-----------+-----------+------------+------------+ Left   0.78  0.29                                +-------+-----------+-----------+------------+------------+  Summary: Right: Resting right ankle-brachial index indicates moderate right lower extremity arterial disease. The right toe-brachial index is abnormal. Left: Resting left ankle-brachial index indicates moderate left lower extremity arterial disease. The left toe-brachial index is abnormal.  *See table(s) above for measurements and observations.  Electronically signed by Deitra Mayo MD on 06/04/2021 at 3:44:42 PM.    Final    XR C-ARM NO REPORT  Result Date: 05/23/2021 Please see Notes tab for  imaging impression.    Assessment/Plan 1. Atherosclerosis of native artery of both lower extremities with intermittent claudication (HCC) Recommend:  The patient has experienced increased symptoms and is now describing lifestyle limiting claudication and mild rest pain bilaterally.  Given the severity of the patient's lower extremity symptoms the patient should undergo left leg angiography and intervention first then the right leg will be treated similarly.  Risk and benefits were reviewed the patient.  Indications for the procedure were reviewed.  All questions were answered, the patient agrees to proceed with the left leg first then the right.   The patient should continue walking and begin a more formal exercise program.  The patient should continue antiplatelet therapy and aggressive treatment of the lipid abnormalities  The patient will follow up with me after the angiogram.    2. Primary hypertension Continue antihypertensive medications as already ordered, these medications have been reviewed and there are no changes at this time.   3. Gastroesophageal reflux disease, unspecified whether esophagitis present Continue PPI as already ordered, this medication has been reviewed and there are no changes at this time.  Avoidence of caffeine and alcohol  Moderate elevation of the head of the bed    4. Hyperlipidemia, unspecified hyperlipidemia type Continue statin as ordered and reviewed, no changes at this time   5. Spondylosis of lumbar region without myelopathy or radiculopathy Continue NSAID medications as already ordered, these medications have been reviewed and there are no changes at this time.  Continued activity and therapy was stressed.     [name], MD  06/12/2021 2:21 PM

## 2021-06-12 NOTE — H&P (View-Only) (Signed)
MRN : [id number]  Martin [name] is a 60 y.o. (September 07, 1961) male who presents with chief complaint of check circulation.  History of Present Illness:  The patient is seen for evaluation of painful lower extremities and diminished pulses. Patient notes the pain is always associated with activity and is very consistent day today. Typically, the pain occurs at less than one block, progress is as activity continues to the point that the patient must stop walking. Resting including standing still for several minutes allowed resumption of the activity and the ability to walk a similar distance before stopping again. Uneven terrain and inclined shorten the distance. The pain has been progressive over the past several years. The patient states the inability to walk is now having a profound negative impact on quality of life and daily activities.  The patient denies rest pain or dangling of an extremity off the side of the bed during the night for relief. No open wounds or sores at this time. No prior interventions or surgeries.  No history of back problems or DJD of the lumbar sacral spine.   The patient denies changes in claudication symptoms or new rest pain symptoms.  No new ulcers or wounds of the foot.  The patient's blood pressure has been stable and relatively well controlled. The patient denies amaurosis fugax or recent TIA symptoms. There are no recent neurological changes noted. The patient denies history of DVT, PE or superficial thrombophlebitis. The patient denies recent episodes of angina or shortness of breath.   ABIs obtained 06/04/2021 show Rt=0.73 and Lt=0.78 (monophasic signals bilaterally)   No outpatient medications have been marked as taking for the 06/13/21 encounter (Appointment) with Delana Meyer, Dolores Lory, MD.    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    Anxiety    Arthritis    DDD  lumbar, arthritis knees   DJD (degenerative joint disease) of lumbar spine    GERD (gastroesophageal reflux disease)    Hemorrhoid    internal   History of bowel resection    Hypertension    EKG 12/12 EPIC   no  PCP- states increased lately but hasnt been diagnosed formally   Inguinal hernia    Pneumonia    as child, cough at present with no fever   Pneumonia    as child, cough at present with no fever    Radiation 11/19/11-01/08/12   5040 cGy 28 fx Pelvis and inguinal area    Past Surgical History:  Procedure Laterality Date   APPENDECTOMY     age 72 or 60   EXAMINATION UNDER ANESTHESIA  10/14/2011   Procedure: EXAM UNDER ANESTHESIA;  Surgeon: Earnstine Regal, MD;  Location: [facility];  Service: General;  Laterality: N/A;  exam under anethesia, Excision of mass anal canal, 1.5cm   INGUINAL HERNIA REPAIR  05/11/2012   Procedure: HERNIA REPAIR INGUINAL ADULT;  Surgeon: Earnstine Regal, MD;  Location: [facility];  Service: General;  Laterality: Left;  left inguinal hernia repair with mesh   surgical pathology   10/14/2011   squamous cell ca of anus   transanal excision  10/14/2011   Dr.Todd Gerkin    Social History Social History   Tobacco Use   Smoking status: Every Day    Packs/day: 1.25    Years: 47.00    Pack years: 58.75    Types:  Cigarettes   Smokeless tobacco: Never   Tobacco comments:    Cutting back>> Quit Smart card supplied  Substance Use Topics   Alcohol use: Not Currently    Alcohol/week: 6.0 standard drinks    Types: 6 Cans of beer per week    Comment: weekend    6pk weekend, beer    Drug use: Not Currently    Types: Marijuana, Cocaine    Comment: marijuana in past, occasionally now-last use 3-4 weeks ago    Family History Family History  Problem Relation Age of Onset   Colon cancer Father    Asthma Father    Cancer Father        prostate   Hypertension Father    Asthma Mother    Hypertension Mother    Stomach cancer Neg Hx    Esophageal cancer  Neg Hx     Allergies  Allergen Reactions   Yellow Jacket Venom [Bee Venom] Anaphylaxis   Aspirin Swelling   Codeine Nausea Only   Morphine And Related Itching     REVIEW OF SYSTEMS (Negative unless checked)  Constitutional: [] Weight loss  [] Fever  [] Chills Cardiac: [] Chest pain   [] Chest pressure   [] Palpitations   [] Shortness of breath when laying flat   [] Shortness of breath with exertion. Vascular:  [x] Pain in legs with walking   [] Pain in legs at rest  [] History of DVT   [] Phlebitis   [] Swelling in legs   [] Varicose veins   [] Non-healing ulcers Pulmonary:   [] Uses home oxygen   [] Productive cough   [] Hemoptysis   [] Wheeze  [] COPD   [] Asthma Neurologic:  [] Dizziness   [] Seizures   [] History of stroke   [] History of TIA  [] Aphasia   [] Vissual changes   [] Weakness or numbness in arm   [] Weakness or numbness in leg Musculoskeletal:   [] Joint swelling   [] Joint pain   [x] Low back pain Hematologic:  [] Easy bruising  [] Easy bleeding   [] Hypercoagulable state   [] Anemic Gastrointestinal:  [] Diarrhea   [] Vomiting  [x] Gastroesophageal reflux/heartburn   [] Difficulty swallowing. Genitourinary:  [] Chronic kidney disease   [] Difficult urination  [] Frequent urination   [] Blood in urine Skin:  [] Rashes   [] Ulcers  Psychological:  [] History of anxiety   []  History of major depression.  Physical Examination  There were no vitals filed for this visit. There is no height or weight on file to calculate BMI. Gen: WD/WN, NAD Head: George/AT, No temporalis wasting.  Ear/Nose/Throat: Hearing grossly intact, nares w/o erythema or drainage Eyes: PER, EOMI, sclera nonicteric.  Neck: Supple, no masses.  No bruit or JVD.  Pulmonary:  Good air movement, no audible wheezing, no use of accessory muscles.  Cardiac: RRR, normal S1, S2, no Murmurs. Vascular:   Vessel Right Left  Radial Palpable Palpable  PT Not Palpable Not Palpable  DP Not Palpable Not Palpable  Gastrointestinal: soft, non-distended. No  guarding/no peritoneal signs.  Musculoskeletal: M/S 5/5 throughout.  No visible deformity.  Neurologic: CN 2-12 intact. Pain and light touch intact in extremities.  Symmetrical.  Speech is fluent. Motor exam as listed above. Psychiatric: Judgment intact, Mood & affect appropriate for pt's clinical situation. Dermatologic: No rashes or ulcers noted.  No changes consistent with cellulitis.   CBC Lab Results  Component Value Date   WBC 8.5 06/06/2021   HGB 13.1 06/06/2021   HCT 36.9 (L) 06/06/2021   MCV 90 06/06/2021   PLT 163 06/06/2021    BMET    Component Value Date/Time  NA 138 06/06/2021 1134   NA 141 10/19/2014 1204   K 5.0 06/06/2021 1134   K 4.6 10/19/2014 1204   CL 102 06/06/2021 1134   CO2 23 06/06/2021 1134   CO2 26 10/19/2014 1204   GLUCOSE 83 06/06/2021 1134   GLUCOSE 114 (H) 03/10/2020 1219   GLUCOSE 91 10/19/2014 1204   BUN 12 06/06/2021 1134   BUN 10.8 10/19/2014 1204   CREATININE 0.81 06/06/2021 1134   CREATININE 0.83 07/05/2015 1042   CREATININE 0.9 10/19/2014 1204   CALCIUM 9.2 06/06/2021 1134   CALCIUM 9.6 10/19/2014 1204   GFRNONAA >60 03/10/2020 1219   GFRNONAA >89 07/05/2015 1042   GFRAA >60 03/10/2020 1219   GFRAA >89 07/05/2015 1042   Estimated Creatinine Clearance: 82 mL/min (by C-G formula based on SCr of 0.81 mg/dL).  COAG No results found for: INR, PROTIME  Radiology Epidural Steroid injection  Result Date: 05/23/2021 Magnus Sinning, MD     05/26/2021  5:40 PM Lumbosacral Transforaminal Epidural Steroid Injection - Sub-Pedicular Approach with Fluoroscopic Guidance Patient: YADIER BRAMHALL     Date of Birth: December 30, 1960 MRN: [id number] PCP: Patient, No Pcp Per (Inactive)     Visit Date: 05/23/2021  Universal Protocol:   Date/Time: 05/23/2021 Consent Given By: the patient Position: PRONE Additional Comments: Vital signs were monitored before and after the procedure. Patient was prepped and draped in the usual sterile fashion. The correct  patient, procedure, and site was verified. Injection Procedure Details: Procedure diagnoses: Lumbar radiculopathy [M54.16]  Meds Administered: Meds ordered this encounter Medications  methylPREDNISolone acetate (DEPO-MEDROL) injection 80 mg Laterality: Bilateral Location/Site: L5 Needle:5.0 in., 22 ga.  Short bevel or Quincke spinal needle Needle Placement: Transforaminal Findings:   -Comments: Excellent flow of contrast along the nerve, nerve root and into the epidural space. Procedure Details: After squaring off the end-plates to get a true AP view, the C-arm was positioned so that an oblique view of the foramen as noted above was visualized. The target area is just inferior to the "nose of the scotty dog" or sub pedicular. The soft tissues overlying this structure were infiltrated with 2-3 ml. of 1% Lidocaine without Epinephrine. The spinal needle was inserted toward the target using a "trajectory" view along the fluoroscope beam.  Under AP and lateral visualization, the needle was advanced so it did not puncture dura and was located close the 6 O'Clock position of the pedical in AP tracterory. Biplanar projections were used to confirm position. Aspiration was confirmed to be negative for CSF and/or blood. A 1-2 ml. volume of Isovue-250 was injected and flow of contrast was noted at each level. Radiographs were obtained for documentation purposes. After attaining the desired flow of contrast documented above, a 0.5 to 1.0 ml test dose of 0.25% Marcaine was injected into each respective transforaminal space.  The patient was observed for 90 seconds post injection.  After no sensory deficits were reported, and normal lower extremity motor function was noted,   the above injectate was administered so that equal amounts of the injectate were placed at each foramen (level) into the transforaminal epidural space. Additional Comments: The patient tolerated the procedure well Dressing: 2 x 2 sterile gauze and Band-Aid   Post-procedure details: Patient was observed during the procedure. Post-procedure instructions were reviewed. Patient left the clinic in stable condition.   VAS [place] ABI WITH/WO TBI  Result Date: 06/04/2021  LOWER EXTREMITY DOPPLER STUDY Patient Name:  JUDE NACLERIO  Date of Exam:   06/04/2021  Medical Rec #: [id number]        Accession #:    [id number] Date of Birth: 20-Feb-1961        Patient Gender: M Patient Age:   50 years Exam Location:  [facility] Procedure:      VAS [place] ABI WITH/WO TBI Referring Phys: MARK YATES --------------------------------------------------------------------------------  Indications: Claudication, and peripheral artery disease. High Risk Factors: Hypertension, current smoker.  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 Moderate focal stenosis                   involving the proximal portion of the right external iliac                   artery. Left common and external iliac arteries widely patent.                    No prior ABI studies. Performing Technologist: Darlin Coco RDMS RVT  Examination Guidelines: A complete evaluation includes at minimum, Doppler waveform signals and systolic blood pressure reading at the level of bilateral brachial, anterior tibial, and posterior tibial arteries, when vessel segments are accessible. Bilateral testing is considered an integral part of a complete examination. Photoelectric Plethysmograph (PPG) waveforms and toe systolic pressure readings are included as required and additional duplex testing as needed. Limited examinations for reoccurring indications may be performed as noted.  ABI Findings: +---------+------------------+-----+----------+--------+ Right    Rt Pressure (mmHg)IndexWaveform  Comment  +---------+------------------+-----+----------+--------+ Brachial 161                    triphasic           +---------+------------------+-----+----------+--------+ PTA      117               0.73 monophasic         +---------+------------------+-----+----------+--------+ DP       102               0.63 monophasic         +---------+------------------+-----+----------+--------+ Great Toe63                0.39 Abnormal           +---------+------------------+-----+----------+--------+ +---------+------------------+-----+----------+-------+ Left     Lt Pressure (mmHg)IndexWaveform  Comment +---------+------------------+-----+----------+-------+ Brachial 159                    triphasic         +---------+------------------+-----+----------+-------+ PTA      109               0.68 monophasic        +---------+------------------+-----+----------+-------+ DP       126               0.78 monophasic        +---------+------------------+-----+----------+-------+ Great Toe47                0.29 Abnormal          +---------+------------------+-----+----------+-------+ +-------+-----------+-----------+------------+------------+ ABI/TBIToday's ABIToday's TBIPrevious ABIPrevious TBI +-------+-----------+-----------+------------+------------+ Right  0.73       0.39                                +-------+-----------+-----------+------------+------------+ Left   0.78  0.29                                +-------+-----------+-----------+------------+------------+  Summary: Right: Resting right ankle-brachial index indicates moderate right lower extremity arterial disease. The right toe-brachial index is abnormal. Left: Resting left ankle-brachial index indicates moderate left lower extremity arterial disease. The left toe-brachial index is abnormal.  *See table(s) above for measurements and observations.  Electronically signed by Deitra Mayo MD on 06/04/2021 at 3:44:42 PM.    Final    XR C-ARM NO REPORT  Result Date: 05/23/2021 Please see Notes tab for  imaging impression.    Assessment/Plan 1. Atherosclerosis of native artery of both lower extremities with intermittent claudication (HCC) Recommend:  The patient has experienced increased symptoms and is now describing lifestyle limiting claudication and mild rest pain bilaterally.  Given the severity of the patient's lower extremity symptoms the patient should undergo left leg angiography and intervention first then the right leg will be treated similarly.  Risk and benefits were reviewed the patient.  Indications for the procedure were reviewed.  All questions were answered, the patient agrees to proceed with the left leg first then the right.   The patient should continue walking and begin a more formal exercise program.  The patient should continue antiplatelet therapy and aggressive treatment of the lipid abnormalities  The patient will follow up with me after the angiogram.    2. Primary hypertension Continue antihypertensive medications as already ordered, these medications have been reviewed and there are no changes at this time.   3. Gastroesophageal reflux disease, unspecified whether esophagitis present Continue PPI as already ordered, this medication has been reviewed and there are no changes at this time.  Avoidence of caffeine and alcohol  Moderate elevation of the head of the bed    4. Hyperlipidemia, unspecified hyperlipidemia type Continue statin as ordered and reviewed, no changes at this time   5. Spondylosis of lumbar region without myelopathy or radiculopathy Continue NSAID medications as already ordered, these medications have been reviewed and there are no changes at this time.  Continued activity and therapy was stressed.     [name], MD  06/12/2021 2:21 PM

## 2021-06-12 NOTE — Progress Notes (Signed)
This patient presents to the office with two separate complaints.  He says he has callus on his right forefoot for years.  He says he trims down the callus himself but the callus always returns.  He says he also has painful callus between his 1/2 toes left foot.  He says he trimmed the corn last night.  He says he has severe pain and burning in his big toe and second toe left foot.  He presents for evaluation and treatment of these two conditions.  Vascular  Dorsalis pedis pulses are palpable  B/L.   Posterior tibial pulses are absent  B/L.   Capillary return  WNL.  Temperature gradient is  WNL.  Skin turgor  WNL  Sensorium  LOPS diminished left foot.  LOPS  WNL  right.    Nail Exam  Patient has normal nails with no evidence of bacterial or fungal infection.  Orthopedic  Exam  Muscle tone and muscle strength  WNL.  No limitations of motion feet  B/L.  No crepitus or joint effusion noted.  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  Skin  No open lesions.  Normal skin texture and turgor. Corn 1/2 left foot.  Callus sub 3 right foot.  Callus right forefoot   Corn 1/2 left foot.    IE.  Debride callus right forefoot.  Gave him sheet for gel dispersion pads.    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  He would benefit from surgery but he described symptoms of intermittant claudication.  He has an appointment with vascular in [place] in the near future. Diabetic foot exam performed.   RTC prn   Gardiner Barefoot DPM

## 2021-06-13 ENCOUNTER — Ambulatory Visit (INDEPENDENT_AMBULATORY_CARE_PROVIDER_SITE_OTHER): Payer: Medicare Other | Admitting: Vascular Surgery

## 2021-06-13 ENCOUNTER — Encounter (INDEPENDENT_AMBULATORY_CARE_PROVIDER_SITE_OTHER): Payer: Self-pay

## 2021-06-13 ENCOUNTER — Telehealth (INDEPENDENT_AMBULATORY_CARE_PROVIDER_SITE_OTHER): Payer: Self-pay

## 2021-06-13 ENCOUNTER — Encounter (INDEPENDENT_AMBULATORY_CARE_PROVIDER_SITE_OTHER): Payer: Self-pay | Admitting: Vascular Surgery

## 2021-06-13 VITALS — BP 150/66 | HR 60 | Ht 71.0 in | Wt 138.0 lb

## 2021-06-13 DIAGNOSIS — E785 Hyperlipidemia, unspecified: Secondary | ICD-10-CM | POA: Diagnosis not present

## 2021-06-13 DIAGNOSIS — M47816 Spondylosis without myelopathy or radiculopathy, lumbar region: Secondary | ICD-10-CM

## 2021-06-13 DIAGNOSIS — I10 Essential (primary) hypertension: Secondary | ICD-10-CM | POA: Diagnosis not present

## 2021-06-13 DIAGNOSIS — I70213 Atherosclerosis of native arteries of extremities with intermittent claudication, bilateral legs: Secondary | ICD-10-CM

## 2021-06-13 DIAGNOSIS — K219 Gastro-esophageal reflux disease without esophagitis: Secondary | ICD-10-CM

## 2021-06-13 NOTE — Telephone Encounter (Signed)
Patient is schedule for left lower extremity angio on 07/02/21 with Dr Delana Meyer arrival time 8:30 am at [facility]. Patient right lower extremity angio is schedule 07/16/21 arrival time 6:45 am. Patient pre-procedure instructions will be mailed out and patient is aware.

## 2021-06-14 ENCOUNTER — Other Ambulatory Visit (HOSPITAL_COMMUNITY): Payer: Self-pay

## 2021-06-14 ENCOUNTER — Other Ambulatory Visit: Payer: Self-pay | Admitting: *Deleted

## 2021-06-14 DIAGNOSIS — Z716 Tobacco abuse counseling: Secondary | ICD-10-CM

## 2021-06-14 DIAGNOSIS — E785 Hyperlipidemia, unspecified: Secondary | ICD-10-CM

## 2021-06-14 MED ORDER — ATORVASTATIN CALCIUM 40 MG PO TABS: 40.0000 mg | ORAL_TABLET | Freq: Every day | ORAL | 0 refills | Status: DC

## 2021-06-14 MED ORDER — NICOTINE 14 MG/24HR TD PT24: 14.0000 mg | MEDICATED_PATCH | Freq: Every day | TRANSDERMAL | 3 refills | Status: DC

## 2021-06-21 ENCOUNTER — Encounter (INDEPENDENT_AMBULATORY_CARE_PROVIDER_SITE_OTHER): Payer: Self-pay | Admitting: Vascular Surgery

## 2021-06-26 ENCOUNTER — Other Ambulatory Visit: Payer: Self-pay | Admitting: *Deleted

## 2021-06-26 ENCOUNTER — Other Ambulatory Visit (INDEPENDENT_AMBULATORY_CARE_PROVIDER_SITE_OTHER): Payer: Self-pay | Admitting: Vascular Surgery

## 2021-06-26 DIAGNOSIS — I70219 Atherosclerosis of native arteries of extremities with intermittent claudication, unspecified extremity: Secondary | ICD-10-CM

## 2021-06-26 MED ORDER — ALBUTEROL SULFATE HFA 108 (90 BASE) MCG/ACT IN AERS: 2.0000 | INHALATION_SPRAY | Freq: Four times a day (QID) | RESPIRATORY_TRACT | 2 refills | Status: DC | PRN

## 2021-06-30 NOTE — Progress Notes (Signed)
Patient ID: CORGAN MORMILE, male    DOB: [birth year]  MRN: [id number]  CC: Dental Surgical Clearance   Subjective: Martin [name] is a 60 y.o. male who presents for dental surgical clearance.   His concerns today include:  None.   Patient Active Problem List   Diagnosis Date Noted   Callus 06/12/2021   Hav (hallux abducto valgus), unspecified laterality 06/12/2021   Hammer toes, bilateral 06/12/2021   Atherosclerosis of native arteries of extremity with intermittent claudication ([place]) 06/12/2021   Hyperlipidemia 06/07/2021   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   Elevated blood-pressure reading, without diagnosis of hypertension 09/26/2020   Body mass index (BMI) 29.0-29.9, adult 08/29/2020   Partial small bowel obstruction ([place]) 03/10/2020   Small bowel obstruction due to adhesions ([place]) 03/08/2020   Enteritis 10/15/2019   Abnormal liver function 10/15/2019   Leukocytosis 10/15/2019   Nausea & vomiting 10/15/2019   GAD (generalized anxiety disorder) 05/18/2017   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   Constipation 04/11/2017   Bee sting 04/05/2017   Diverticulosis 11/29/2014   Healthcare maintenance 11/06/2014   Cough 10/05/2014   Insomnia 09/13/2014   Erectile dysfunction 11/30/2013   Chronic pain syndrome 11/30/2013   Depression 11/30/2013   GERD (gastroesophageal reflux disease)    Hypertension    Anxiety    Arthritis    Osteoarthritis of lumbar spine    Radiation    Night sweats 08/12/2012   Tobacco abuse counseling 05/26/2012   Lymphocytic colitis 05/03/2012   B12 nutritional deficiency 03/19/2012   Folate deficiency 03/19/2012   Inguinal hernia 11/24/2011   Cancer ([place]) 10/14/2011   Internal hemorrhoid 09/11/2011   Anal cancer ([place]) 10/09/2010     Current Outpatient Medications on File Prior to Visit  Medication Sig Dispense Refill   albuterol (VENTOLIN HFA) 108 (90 Base) MCG/ACT inhaler Inhale 2 puffs into the lungs every 6 (six) hours as  needed for wheezing or shortness of breath. 18 g 2   apixaban (ELIQUIS) 2.5 MG TABS tablet Take 1 tablet (2.5 mg total) by mouth 2 (two) times daily. 60 tablet 3   EPINEPHrine (EPIPEN 2-PAK) 0.3 mg/0.3 mL IJ SOAJ injection Inject 0.3 mLs (0.3 mg total) into the muscle as needed for anaphylaxis. 1 each 0   tamsulosin (FLOMAX) 0.4 MG CAPS capsule Take 1 capsule (0.4 mg total) by mouth at bedtime. 30 capsule 2   umeclidinium-vilanterol (ANORO ELLIPTA) 62.5-25 MCG/INH AEPB Inhale 1 puff into the lungs daily. 60 each 2   atorvastatin (LIPITOR) 40 MG tablet Take 1 tablet (40 mg total) by mouth daily. (Patient not taking: Reported on 07/03/2021) 120 tablet 0   clopidogrel (PLAVIX) 75 MG tablet Take 1 tablet (75 mg total) by mouth daily. (Patient not taking: Reported on 07/03/2021) 30 tablet 5   dextromethorphan-guaiFENesin (MUCINEX DM) 30-600 MG 12hr tablet Take 1 tablet by mouth 2 (two) times daily. (Patient not taking: No sig reported) 16 tablet 0   HYDROcodone bit-homatropine (HYCODAN) 5-1.5 MG/5ML syrup Take 5 mLs by mouth at bedtime as needed for cough. (Patient not taking: No sig reported) 75 mL 0   nicotine (NICODERM CQ) 14 mg/24hr patch Place 1 patch (14 mg total) onto the skin daily. (Patient not taking: No sig reported) 28 patch 3   Phenylephrine-Acetaminophen 5-325 MG CAPS Take 2 tablets by mouth every 6 (six) hours as needed (cold symptoms). (Patient not taking: Reported on 07/03/2021)     predniSONE (DELTASONE) 10 MG tablet Take 6 tabs day one, 5 tabs day  two, 4 tabs day three, etc (Patient not taking: No sig reported) 21 tablet 0   [DISCONTINUED] sucralfate (CARAFATE) 1 GM/10ML suspension Take 10 mLs (1 g total) by mouth 4 (four) times daily -  with meals and at bedtime. (Patient not taking: Reported on 11/28/2019) 420 mL 0   No current facility-administered medications on file prior to visit.    Allergies  Allergen Reactions   Yellow Jacket Venom [Bee Venom] Anaphylaxis   Aspirin Swelling    Codeine Nausea Only   Morphine And Related Itching    Social History   Socioeconomic History   Marital status: Widowed    Spouse name: Not on file   Number of children: 3   Years of education: Not on file   Highest education level: Not on file  Occupational History   Occupation: unemployed  Tobacco Use   Smoking status: Every Day    Packs/day: 1.25    Years: 47.00    Pack years: 58.75    Types: Cigarettes   Smokeless tobacco: Never   Tobacco comments:    Cutting back>> Education officer, environmental card supplied  Substance and Sexual Activity   Alcohol use: Not Currently    Alcohol/week: 6.0 standard drinks    Types: 6 Cans of beer per week    Comment: weekend    6pk weekend, beer    Drug use: Not Currently    Types: Marijuana, Cocaine    Comment: marijuana in past, occasionally now-last use 3-4 weeks ago   Sexual activity: Yes    Birth control/protection: Condom  Other Topics Concern   Not on file  Social History Narrative   Not on file   Social Determinants of Health   Financial Resource Strain: Not on file  Food Insecurity: Not on file  Transportation Needs: Not on file  Physical Activity: Not on file  Stress: Not on file  Social Connections: Not on file  Intimate Partner Violence: Not on file    Family History  Problem Relation Age of Onset   Colon cancer Father    Asthma Father    Cancer Father        prostate   Hypertension Father    Asthma Mother    Hypertension Mother    Stomach cancer Neg Hx    Esophageal cancer Neg Hx     Past Surgical History:  Procedure Laterality Date   APPENDECTOMY     age 89 or 3   EXAMINATION UNDER ANESTHESIA  10/14/2011   Procedure: EXAM UNDER ANESTHESIA;  Surgeon: Earnstine Regal, MD;  Location: [facility];  Service: General;  Laterality: N/A;  exam under anethesia, Excision of mass anal canal, 1.5cm   INGUINAL HERNIA REPAIR  05/11/2012   Procedure: HERNIA REPAIR INGUINAL ADULT;  Surgeon: Earnstine Regal, MD;  Location: [facility];  Service: General;  Laterality: Left;  left inguinal hernia repair with mesh   LOWER EXTREMITY ANGIOGRAPHY Left 07/02/2021   Procedure: LOWER EXTREMITY ANGIOGRAPHY;  Surgeon: Katha Cabal, MD;  Location: [facility];  Service: Cardiovascular;  Laterality: Left;   surgical pathology   10/14/2011   squamous cell ca of anus   transanal excision  10/14/2011   Dr.Todd Gerkin    ROS: Review of Systems Negative except as stated above  PHYSICAL EXAM: BP 138/66   Pulse 80   Resp (!) 24   Wt 135 lb (61.2 kg)   SpO2 98%   BMI 18.83 kg/m   Physical Exam  HENT:     Head: Normocephalic and atraumatic.  Eyes:     Extraocular Movements: Extraocular movements intact.     Conjunctiva/sclera: Conjunctivae normal.     Pupils: Pupils are equal, round, and reactive to light.  Cardiovascular:     Rate and Rhythm: Normal rate and regular rhythm.     Pulses: Normal pulses.     Heart sounds: Normal heart sounds.  Pulmonary:     Effort: Pulmonary effort is normal.     Breath sounds: Normal breath sounds.  Genitourinary:    Comments: Patient declined exam. Musculoskeletal:     Cervical back: Normal range of motion and neck supple.  Neurological:     General: No focal deficit present.     Mental Status: He is alert and oriented to person, place, and time.  Psychiatric:        Mood and Affect: Mood normal.        Behavior: Behavior normal.    ASSESSMENT AND PLAN: 1. Preoperative clearance: 2. Encounter for completion of form with patient: - Patient with pending teeth extraction with Gae Bon, DMD at [facility].  - From primary care standpoint patient cleared for surgery. However, will need medical clearance from Cardiovascular prior to scheduling teeth extraction. Next appointment with Cardiovascular 07/16/2021.  3. Atherosclerosis of artery of extremity with intermittent claudication ([place]): 4. Atherosclerotic peripheral vascular  disease with intermittent claudication [facility]): - Recent hospital admission 07/02/2021. - Keep all appointments with Cardiovascular, next being 07/16/2021. Counseled patient will need medical clearance from the same. Patient verbalized understanding.   Patient was given the opportunity to ask questions.  Patient verbalized understanding of the plan and was able to repeat key elements of the plan. Patient was given clear instructions to go to Emergency Department or return to medical center if symptoms don't improve, worsen, or new problems develop.The patient verbalized understanding.  Follow-up with primary provider as scheduled.   Camillia Herter, NP

## 2021-07-02 ENCOUNTER — Encounter
Admission: RE | Disposition: A | Discharge status: Home / Self Care | Payer: Self-pay | Source: Home / Self Care | Attending: Vascular Surgery

## 2021-07-02 ENCOUNTER — Other Ambulatory Visit: Payer: Self-pay

## 2021-07-02 ENCOUNTER — Encounter: Payer: Self-pay | Admitting: Vascular Surgery

## 2021-07-02 ENCOUNTER — Ambulatory Visit
Admission: RE | Admit: 2021-07-02 | Discharge: 2021-07-02 | Disposition: A | Discharge status: Home / Self Care | Payer: Medicare Other | Attending: Vascular Surgery | Admitting: Vascular Surgery

## 2021-07-02 DIAGNOSIS — M47816 Spondylosis without myelopathy or radiculopathy, lumbar region: Secondary | ICD-10-CM | POA: Diagnosis not present

## 2021-07-02 DIAGNOSIS — K219 Gastro-esophageal reflux disease without esophagitis: Secondary | ICD-10-CM | POA: Insufficient documentation

## 2021-07-02 DIAGNOSIS — Z8249 Family history of ischemic heart disease and other diseases of the circulatory system: Secondary | ICD-10-CM | POA: Insufficient documentation

## 2021-07-02 DIAGNOSIS — I70223 Atherosclerosis of native arteries of extremities with rest pain, bilateral legs: Secondary | ICD-10-CM

## 2021-07-02 DIAGNOSIS — F17210 Nicotine dependence, cigarettes, uncomplicated: Secondary | ICD-10-CM | POA: Diagnosis not present

## 2021-07-02 DIAGNOSIS — E785 Hyperlipidemia, unspecified: Secondary | ICD-10-CM | POA: Diagnosis not present

## 2021-07-02 DIAGNOSIS — I10 Essential (primary) hypertension: Secondary | ICD-10-CM | POA: Insufficient documentation

## 2021-07-02 DIAGNOSIS — Z885 Allergy status to narcotic agent status: Secondary | ICD-10-CM | POA: Insufficient documentation

## 2021-07-02 DIAGNOSIS — Z886 Allergy status to analgesic agent status: Secondary | ICD-10-CM | POA: Diagnosis not present

## 2021-07-02 DIAGNOSIS — I70219 Atherosclerosis of native arteries of extremities with intermittent claudication, unspecified extremity: Secondary | ICD-10-CM

## 2021-07-02 HISTORY — PX: LOWER EXTREMITY ANGIOGRAPHY: CATH118251

## 2021-07-02 LAB — BUN: BUN: 11 mg/dL (ref 8–23)

## 2021-07-02 LAB — CREATININE, SERUM
Creatinine, Ser: 0.81 mg/dL (ref 0.61–1.24)
GFR, Estimated: >60 mL/min (ref >60)

## 2021-07-02 SURGERY — LOWER EXTREMITY ANGIOGRAPHY
Anesthesia: Moderate Sedation | Site: Leg Lower | Laterality: Left

## 2021-07-02 MED ORDER — HYDROMORPHONE HCL 1 MG/ML IJ SOLN: 1.0000 mg | Freq: Once | INTRAMUSCULAR | Status: DC | PRN

## 2021-07-02 MED ORDER — LABETALOL HCL 5 MG/ML IV SOLN: 10.0000 mg | INTRAVENOUS | Status: DC | PRN

## 2021-07-02 MED ORDER — FENTANYL CITRATE (PF) 100 MCG/2ML IJ SOLN
INTRAMUSCULAR | Status: AC
  Filled 2021-07-02: qty 2

## 2021-07-02 MED ORDER — ONDANSETRON HCL 4 MG/2ML IJ SOLN: 4.0000 mg | Freq: Four times a day (QID) | INTRAMUSCULAR | Status: DC | PRN

## 2021-07-02 MED ORDER — CLOPIDOGREL BISULFATE 75 MG PO TABS
ORAL_TABLET | ORAL | Status: AC
  Filled 2021-07-02: qty 4

## 2021-07-02 MED ORDER — HEPARIN SODIUM (PORCINE) 1000 UNIT/ML IJ SOLN
INTRAMUSCULAR | Status: DC | PRN
  Administered 2021-07-02: 5000 [IU] via INTRAVENOUS

## 2021-07-02 MED ORDER — CLOPIDOGREL BISULFATE 75 MG PO TABS: 75.0000 mg | ORAL_TABLET | Freq: Every day | ORAL | 5 refills | Status: DC

## 2021-07-02 MED ORDER — FENTANYL CITRATE (PF) 100 MCG/2ML IJ SOLN
INTRAMUSCULAR | Status: DC | PRN
  Administered 2021-07-02: 50 ug via INTRAVENOUS
  Administered 2021-07-02: 25 ug via INTRAVENOUS

## 2021-07-02 MED ORDER — FAMOTIDINE 20 MG PO TABS: 40.0000 mg | ORAL_TABLET | Freq: Once | ORAL | Status: DC | PRN

## 2021-07-02 MED ORDER — HYDRALAZINE HCL 20 MG/ML IJ SOLN: 5.0000 mg | INTRAMUSCULAR | Status: DC | PRN

## 2021-07-02 MED ORDER — HEPARIN SODIUM (PORCINE) 1000 UNIT/ML IJ SOLN
INTRAMUSCULAR | Status: AC
  Filled 2021-07-02: qty 1

## 2021-07-02 MED ORDER — MIDAZOLAM HCL 2 MG/2ML IJ SOLN
INTRAMUSCULAR | Status: AC
  Filled 2021-07-02: qty 2

## 2021-07-02 MED ORDER — APIXABAN 2.5 MG PO TABS
2.5000 mg | ORAL_TABLET | ORAL | Status: AC
  Administered 2021-07-02: 2.5 mg via ORAL
  Filled 2021-07-02: qty 1

## 2021-07-02 MED ORDER — IODIXANOL 320 MG/ML IV SOLN
INTRAVENOUS | Status: DC | PRN
  Administered 2021-07-02: 65 mL via INTRA_ARTERIAL

## 2021-07-02 MED ORDER — CLOPIDOGREL BISULFATE 300 MG PO TABS
300.0000 mg | ORAL_TABLET | ORAL | Status: AC
  Administered 2021-07-02: 300 mg via ORAL

## 2021-07-02 MED ORDER — MIDAZOLAM HCL 2 MG/ML PO SYRP: 8.0000 mg | ORAL_SOLUTION | Freq: Once | ORAL | Status: DC | PRN

## 2021-07-02 MED ORDER — ACETAMINOPHEN 325 MG PO TABS: 650.0000 mg | ORAL_TABLET | ORAL | Status: DC | PRN

## 2021-07-02 MED ORDER — CEFAZOLIN SODIUM-DEXTROSE 2-4 GM/100ML-% IV SOLN: 2.0000 g | Freq: Once | INTRAVENOUS | Status: AC

## 2021-07-02 MED ORDER — SODIUM CHLORIDE 0.9% FLUSH: 3.0000 mL | INTRAVENOUS | Status: DC | PRN

## 2021-07-02 MED ORDER — OXYCODONE HCL 5 MG PO TABS: 5.0000 mg | ORAL_TABLET | ORAL | Status: DC | PRN

## 2021-07-02 MED ORDER — DIPHENHYDRAMINE HCL 50 MG/ML IJ SOLN: 50.0000 mg | Freq: Once | INTRAMUSCULAR | Status: DC | PRN

## 2021-07-02 MED ORDER — MORPHINE SULFATE (PF) 4 MG/ML IV SOLN
2.0000 mg | INTRAVENOUS | Status: DC | PRN
Start: 2021-07-02 — End: 2021-07-02

## 2021-07-02 MED ORDER — MIDAZOLAM HCL 2 MG/2ML IJ SOLN
INTRAMUSCULAR | Status: DC | PRN
  Administered 2021-07-02: .5 mg via INTRAVENOUS
  Administered 2021-07-02: 2 mg via INTRAVENOUS

## 2021-07-02 MED ORDER — SODIUM CHLORIDE 0.9% FLUSH: 3.0000 mL | Freq: Two times a day (BID) | INTRAVENOUS | Status: DC

## 2021-07-02 MED ORDER — SODIUM CHLORIDE 0.9 % IV SOLN: 250.0000 mL | INTRAVENOUS | Status: DC | PRN

## 2021-07-02 MED ORDER — ATORVASTATIN CALCIUM 10 MG PO TABS
10.0000 mg | ORAL_TABLET | Freq: Every day | ORAL | Status: DC
  Filled 2021-07-02: qty 1

## 2021-07-02 MED ORDER — APIXABAN 2.5 MG PO TABS: 2.5000 mg | ORAL_TABLET | Freq: Two times a day (BID) | ORAL | 3 refills | Status: DC

## 2021-07-02 MED ORDER — SODIUM CHLORIDE 0.9 % IV SOLN: INTRAVENOUS | Status: DC

## 2021-07-02 MED ORDER — METHYLPREDNISOLONE SODIUM SUCC 125 MG IJ SOLR: 125.0000 mg | Freq: Once | INTRAMUSCULAR | Status: DC | PRN

## 2021-07-02 MED ORDER — CEFAZOLIN SODIUM-DEXTROSE 2-4 GM/100ML-% IV SOLN
INTRAVENOUS | Status: AC
  Administered 2021-07-02: 2 g via INTRAVENOUS
  Filled 2021-07-02: qty 100

## 2021-07-02 SURGICAL SUPPLY — 27 items
BALLN LUTONIX 018 4X40X130 (BALLOONS) ×2
BALLN LUTONIX 6X220X130 (BALLOONS) ×2
BALLN LUTONIX DCB 7X60X130 (BALLOONS) ×2
BALLN ULTRASCORE 014 3X40X150 (BALLOONS) ×2
BALLN ULTRASCORE 4X40X130 (BALLOONS) ×2
BALLOON LUTONIX 018 4X40X130 (BALLOONS) ×1 IMPLANT
BALLOON LUTONIX 6X220X130 (BALLOONS) ×1 IMPLANT
BALLOON LUTONIX DCB 7X60X130 (BALLOONS) ×1 IMPLANT
BALLOON ULTRASCORE 4X40X130 (BALLOONS) ×1 IMPLANT
BALLOON ULTRSCRE 014 3X40X150 (BALLOONS) ×1 IMPLANT
CATH ANGIO 5F PIGTAIL 65CM (CATHETERS) ×2 IMPLANT
CATH VERT 5FR 125CM (CATHETERS) ×2 IMPLANT
COVER PROBE U/S 5X48 (MISCELLANEOUS) ×2 IMPLANT
DEVICE STARCLOSE SE CLOSURE (Vascular Products) ×2 IMPLANT
GLIDEWIRE ADV .035X260CM (WIRE) ×2 IMPLANT
KIT ENCORE 26 ADVANTAGE (KITS) ×2 IMPLANT
NEEDLE ENTRY 21GA 7CM ECHOTIP (NEEDLE) ×2 IMPLANT
PACK ANGIOGRAPHY (CUSTOM PROCEDURE TRAY) ×2 IMPLANT
SET INTRO CAPELLA COAXIAL (SET/KITS/TRAYS/PACK) ×2 IMPLANT
SHEATH BRITE TIP 5FRX11 (SHEATH) ×2 IMPLANT
SHEATH BRITE TIP 6FRX11 (SHEATH) ×2 IMPLANT
SHEATH RAABE 6FR (SHEATH) ×2 IMPLANT
STENT LIFESTAR 9X60 (Permanent Stent) ×2 IMPLANT
SYR MEDRAD MARK 7 150ML (SYRINGE) ×2 IMPLANT
TUBING CONTRAST HIGH PRESS 72 (TUBING) ×2 IMPLANT
WIRE GUIDERIGHT .035X150 (WIRE) ×2 IMPLANT
WIRE RUNTHROUGH .014X300CM (WIRE) ×2 IMPLANT

## 2021-07-02 NOTE — Interval H&P Note (Signed)
History and Physical Interval Note:  07/02/2021 8:39 AM  Martin [name]  has presented today for surgery, with the diagnosis of LLE Angiography   ASO with claudication   BARD Rep  cc: Judi Cong.  The various methods of treatment have been discussed with the patient and family. After consideration of risks, benefits and other options for treatment, the patient has consented to  Procedure(s): LOWER EXTREMITY ANGIOGRAPHY (Left) as a surgical intervention.  The patient's history has been reviewed, patient examined, no change in status, stable for surgery.  I have reviewed the patient's chart and labs.  Questions were answered to the patient's satisfaction.     [name]

## 2021-07-02 NOTE — Op Note (Signed)
[facility]  Percutaneous Study/Intervention Procedural Note   Date of Surgery: 07/02/2021  Surgeon:  [name]  Pre-operative Diagnosis: Atherosclerotic occlusive disease bilateral lower extremities with lifestyle limiting claudication and rest pain symptoms bilateral lower extremities  Post-operative diagnosis:  Same  Procedure(s) Performed:             1.  Introduction catheter into left lower extremity 3rd order catheter placement               2.    Contrast injection left lower extremity for distal runoff             3.  Percutaneous transluminal angioplasty left superficial femoral arteries to 6 mm with a Lutonix drug-eluting balloon             4.  Percutaneous transluminal angioplasty left anterior tibial artery to 4 mm with a Lutonix drug-eluting balloon             5.  Percutaneous transluminal angioplasty and stent placement right external iliac artery with a 9 mm x 60 mm life star stent.               6.  Star close closure right common femoral arteriotomy  Anesthesia: Conscious sedation was administered under my direct supervision by the interventional radiology RN. IV Versed plus fentanyl were utilized. Continuous ECG, pulse oximetry and blood pressure was monitored throughout the entire procedure.  Conscious sedation was for a total of 1 hour 17 minutes.  Sheath: 6 [place] Rabie retrograde right common femoral artery for the left lower extremity interventions; 6 French 11 cm Pinnacle sheath right common femoral artery retrograde for the right iliac intervention  Contrast: 65 cc  Fluoroscopy Time: 6.7 minutes  Indications:  Martin [name] presents with increasing pain of the bilateral lower extremity.  Noninvasive studies as well as physical examination demonstrate severe atherosclerotic occlusive disease.  This suggests the patient is having limb threatening ischemia. The risks and benefits are reviewed all questions answered patient agrees  to proceed.  Procedure: Martin [name] is a 60 y.o. y.o. male who was identified and appropriate procedural time out was performed.  The patient was then placed supine on the table and prepped and draped in the usual sterile fashion.    Ultrasound was placed in the sterile sleeve and the right groin was evaluated the right common femoral artery was echolucent and pulsatile indicating patency.  Image was recorded for the permanent record and under real-time visualization a microneedle was inserted into the common femoral artery followed by the microwire and then the micro-sheath.  A J-wire was then advanced through the micro-sheath and a  5 [place] sheath was then inserted over a J-wire. J-wire was then advanced and a 5 French pigtail catheter was positioned at the level of T12.  AP projection of the aorta was then obtained. Pigtail catheter was repositioned to above the bifurcation and a RAO view of the pelvis was obtained.  Subsequently a pigtail catheter with the stiff angle Glidewire was used to cross the aortic bifurcation the catheter wire were advanced down into the left distal external iliac artery. Oblique view of the femoral bifurcation was then obtained and subsequently the wire was reintroduced and the pigtail catheter negotiated into the SFA representing third order catheter placement. Distal runoff was then performed.  Diagnostic interpretation: The abdominal aorta is opacified with a bolus injection contrast.  There are no hemodynamically significant stenoses noted within the aorta.  Aortic bifurcation is widely patent.  Bilateral common iliac arteries are mildly diseased but there are no hemodynamically significant lesions.  On the right the external iliac artery has a greater than 80% stenosis over 4 to 5 cm in length that this appears to occur at the origin.  The right internal iliac artery is flush occlusion and does not reconstitute.  The left external iliac artery and internal iliac artery  are diffusely diseased but both are patent there is no hemodynamically significant stenosis of the left external iliac artery.  The left common femoral profunda femoris demonstrates moderate atherosclerotic changes but there are no hemodynamically significant stenoses noted.  The origin of the left SFA is diffusely diseased but widely patent.  Beginning in the proximal one third and extending to the mid portion of the SFA there are 2 greater than 90% stenoses noted.  There is diffuse disease located between these 2.  The length of the lesion is approximately 200 mm.  The distal SFA and popliteal are diffusely diseased but widely patent.  Trifurcation demonstrates increasing disease with a greater than 80% stenosis at the origin of the anterior tibial.  Distal to the stenosis the anterior tibial is widely patent although there is mild to moderate atherosclerotic changes.  This is the dominant runoff to the foot.  Tibioperoneal trunk is diffusely diseased posterior tibial is patent in its proximal two thirds but does not fill distally.  Peroneal appears to occlude shortly after its origin.  5000 units of heparin was then given and allowed to circulate and a 6 [place] Rabie sheath was advanced up and over the bifurcation and positioned in the proximal left superficial femoral artery  4 mm x 40 mm ultra score balloon was used to angioplasty the SFA, multiple inflations were required to cover these lesions. Each inflation was for 1 minutes at 8 to 12 atm.  Next a 6 mm x 220 mm Lutonix drug-eluting balloon is advanced across this lesion.  Inflation is to 8 atm for 1 minute.  Follow-up imaging demonstrated excellent patency and less than 10% residual stenosis with preservation of the distal runoff.  The detector was then repositioned and the trifurcation was reimaged 80% stenosis is noted in origin of the anterior tibial artery and after appropriate measurements are made a 3 mm x 40 mm ultra score balloon is  utilized.  Inflation is to 8 to 10 atm for 1 minute.  Follow-up imaging demonstrates there is significant residual stenosis greater than 30% and a 4 x 40 Lutonix balloon was used to angioplasty the origin of the anterior tibial artery. Follow-up imaging demonstrated patency with excellent result. Distal runoff was then reassessed and noted to be widely patent.    After review of these images the sheath is pulled into the right distal external iliac artery and an LAO projection of the right external iliac artery is performed.  Again the greater than 80% stenosis is noted.  A 9 mm x 60 mm life star stent is then advanced across this lesion deployed and postdilated with a 7 mm x 60 Lutonix drug-eluting balloon inflated to 12 atm for approximately 1 minute.  Oblique view of the stent including the right common femoral is obtained and the stent is noted to be widely patent with less than 5% residual stenosis.  A Star close device deployed. There no immediate Complications.  Findings:   The abdominal aorta is opacified with a bolus injection contrast.  There are no hemodynamically significant stenoses noted  within the aorta.  Aortic bifurcation is widely patent.  Bilateral common iliac arteries are mildly diseased but there are no hemodynamically significant lesions.  On the right the external iliac artery has a greater than 80% stenosis over 4 to 5 cm in length that this appears to occur at the origin.  The right internal iliac artery is flush occlusion and does not reconstitute.  The left external iliac artery and internal iliac artery are diffusely diseased but both are patent there is no hemodynamically significant stenosis of the left external iliac artery.  The left common femoral profunda femoris demonstrates moderate atherosclerotic changes but there are no hemodynamically significant stenoses noted.  The origin of the left SFA is diffusely diseased but widely patent.  Beginning in the proximal one third  and extending to the mid portion of the SFA there are 2 greater than 90% stenoses noted.  There is diffuse disease located between these 2.  The length of the lesion is approximately 200 mm.  The distal SFA and popliteal are diffusely diseased but widely patent.  Trifurcation demonstrates increasing disease with a greater than 80% stenosis at the origin of the anterior tibial.  Distal to the stenosis the anterior tibial is widely patent although there is mild to moderate atherosclerotic changes.  This is the dominant runoff to the foot.  Tibioperoneal trunk is diffusely diseased posterior tibial is patent in its proximal two thirds but does not fill distally.  Peroneal appears to occlude shortly after its origin.  Following angioplasty of the left SFA to 6 mm with Lutonix drug-eluting balloon there is now less than 10% residual stenosis.  Following angioplasty left anterior tibial now is in-line flow and looks quite nice and there is less than 10% residual stenosis. Angioplasty and stent placement of the right external iliac artery an excellent result with less than 5 % residual stenosis.  Summary: Successful recanalization left lower extremity for limb salvage with treatment of the inflow for the right lower extremity                           Disposition: Patient was taken to the recovery room in stable condition having tolerated the procedure well.  Belenda Cruise Malaiyah Achorn 07/02/2021,10:05 AM

## 2021-07-03 ENCOUNTER — Ambulatory Visit (INDEPENDENT_AMBULATORY_CARE_PROVIDER_SITE_OTHER): Payer: Medicare Other | Admitting: Family

## 2021-07-03 ENCOUNTER — Encounter: Payer: Self-pay | Admitting: Family

## 2021-07-03 ENCOUNTER — Other Ambulatory Visit: Payer: Self-pay | Admitting: Family

## 2021-07-03 VITALS — BP 138/66 | HR 80 | Resp 24 | Wt 135.0 lb

## 2021-07-03 DIAGNOSIS — I70219 Atherosclerosis of native arteries of extremities with intermittent claudication, unspecified extremity: Secondary | ICD-10-CM

## 2021-07-03 DIAGNOSIS — Z0289 Encounter for other administrative examinations: Secondary | ICD-10-CM | POA: Diagnosis not present

## 2021-07-03 DIAGNOSIS — J329 Chronic sinusitis, unspecified: Secondary | ICD-10-CM

## 2021-07-03 DIAGNOSIS — Z01818 Encounter for other preprocedural examination: Secondary | ICD-10-CM

## 2021-07-03 MED ORDER — AMOXICILLIN-POT CLAVULANATE 875-125 MG PO TABS: 1.0000 | ORAL_TABLET | Freq: Two times a day (BID) | ORAL | 0 refills | Status: DC

## 2021-07-16 ENCOUNTER — Encounter: Payer: Self-pay | Admitting: Vascular Surgery

## 2021-07-16 ENCOUNTER — Encounter
Admission: RE | Disposition: A | Discharge status: Home / Self Care | Payer: Self-pay | Source: Home / Self Care | Attending: Vascular Surgery

## 2021-07-16 ENCOUNTER — Ambulatory Visit
Admission: RE | Admit: 2021-07-16 | Discharge: 2021-07-16 | Disposition: A | Discharge status: Home / Self Care | Payer: Medicare Other | Attending: Vascular Surgery | Admitting: Vascular Surgery

## 2021-07-16 ENCOUNTER — Other Ambulatory Visit (INDEPENDENT_AMBULATORY_CARE_PROVIDER_SITE_OTHER): Payer: Self-pay | Admitting: Nurse Practitioner

## 2021-07-16 DIAGNOSIS — E785 Hyperlipidemia, unspecified: Secondary | ICD-10-CM | POA: Insufficient documentation

## 2021-07-16 DIAGNOSIS — I70221 Atherosclerosis of native arteries of extremities with rest pain, right leg: Secondary | ICD-10-CM | POA: Insufficient documentation

## 2021-07-16 DIAGNOSIS — M47816 Spondylosis without myelopathy or radiculopathy, lumbar region: Secondary | ICD-10-CM | POA: Insufficient documentation

## 2021-07-16 DIAGNOSIS — I70213 Atherosclerosis of native arteries of extremities with intermittent claudication, bilateral legs: Secondary | ICD-10-CM | POA: Diagnosis present

## 2021-07-16 DIAGNOSIS — I10 Essential (primary) hypertension: Secondary | ICD-10-CM | POA: Insufficient documentation

## 2021-07-16 DIAGNOSIS — I70219 Atherosclerosis of native arteries of extremities with intermittent claudication, unspecified extremity: Secondary | ICD-10-CM

## 2021-07-16 DIAGNOSIS — K219 Gastro-esophageal reflux disease without esophagitis: Secondary | ICD-10-CM | POA: Diagnosis not present

## 2021-07-16 HISTORY — PX: LOWER EXTREMITY ANGIOGRAPHY: CATH118251

## 2021-07-16 LAB — BUN: BUN: 11 mg/dL (ref 8–23)

## 2021-07-16 LAB — CREATININE, SERUM
Creatinine, Ser: 0.8 mg/dL (ref 0.61–1.24)
GFR, Estimated: >60 mL/min (ref >60)

## 2021-07-16 SURGERY — LOWER EXTREMITY ANGIOGRAPHY
Anesthesia: Moderate Sedation | Site: Leg Lower | Laterality: Right

## 2021-07-16 MED ORDER — SODIUM CHLORIDE 0.9% FLUSH: 3.0000 mL | Freq: Two times a day (BID) | INTRAVENOUS | Status: DC

## 2021-07-16 MED ORDER — FENTANYL CITRATE PF 50 MCG/ML IJ SOSY: 12.5000 ug | PREFILLED_SYRINGE | Freq: Once | INTRAMUSCULAR | Status: DC | PRN

## 2021-07-16 MED ORDER — DIPHENHYDRAMINE HCL 50 MG/ML IJ SOLN: 50.0000 mg | Freq: Once | INTRAMUSCULAR | Status: DC | PRN

## 2021-07-16 MED ORDER — ACETAMINOPHEN 325 MG PO TABS: 650.0000 mg | ORAL_TABLET | ORAL | Status: DC | PRN

## 2021-07-16 MED ORDER — HYDRALAZINE HCL 20 MG/ML IJ SOLN: 5.0000 mg | INTRAMUSCULAR | Status: DC | PRN

## 2021-07-16 MED ORDER — FENTANYL CITRATE PF 50 MCG/ML IJ SOSY
PREFILLED_SYRINGE | INTRAMUSCULAR | Status: AC
  Filled 2021-07-16: qty 1

## 2021-07-16 MED ORDER — ONDANSETRON HCL 4 MG/2ML IJ SOLN: 4.0000 mg | Freq: Four times a day (QID) | INTRAMUSCULAR | Status: DC | PRN

## 2021-07-16 MED ORDER — CEFAZOLIN SODIUM-DEXTROSE 2-4 GM/100ML-% IV SOLN
INTRAVENOUS | Status: AC
  Filled 2021-07-16: qty 100

## 2021-07-16 MED ORDER — HEPARIN SODIUM (PORCINE) 1000 UNIT/ML IJ SOLN
INTRAMUSCULAR | Status: DC | PRN
  Administered 2021-07-16: 5000 [IU] via INTRAVENOUS

## 2021-07-16 MED ORDER — SODIUM CHLORIDE 0.9 % IV SOLN: INTRAVENOUS | Status: DC

## 2021-07-16 MED ORDER — FENTANYL CITRATE (PF) 100 MCG/2ML IJ SOLN
INTRAMUSCULAR | Status: DC | PRN
  Administered 2021-07-16 (×2): 25 ug via INTRAVENOUS

## 2021-07-16 MED ORDER — DIPHENHYDRAMINE HCL 50 MG/ML IJ SOLN
INTRAMUSCULAR | Status: DC | PRN
  Administered 2021-07-16: 50 mg via INTRAVENOUS

## 2021-07-16 MED ORDER — MIDAZOLAM HCL 2 MG/ML PO SYRP: 8.0000 mg | ORAL_SOLUTION | Freq: Once | ORAL | Status: DC | PRN

## 2021-07-16 MED ORDER — OXYCODONE HCL 5 MG PO TABS: 5.0000 mg | ORAL_TABLET | ORAL | Status: DC | PRN

## 2021-07-16 MED ORDER — MORPHINE SULFATE (PF) 4 MG/ML IV SOLN: 2.0000 mg | INTRAVENOUS | Status: DC | PRN

## 2021-07-16 MED ORDER — SODIUM CHLORIDE 0.9 % IV SOLN: 250.0000 mL | INTRAVENOUS | Status: DC | PRN

## 2021-07-16 MED ORDER — METHYLPREDNISOLONE SODIUM SUCC 125 MG IJ SOLR: 125.0000 mg | Freq: Once | INTRAMUSCULAR | Status: DC | PRN

## 2021-07-16 MED ORDER — HEPARIN SODIUM (PORCINE) 1000 UNIT/ML IJ SOLN
INTRAMUSCULAR | Status: AC
  Filled 2021-07-16: qty 1

## 2021-07-16 MED ORDER — FAMOTIDINE 20 MG PO TABS: 40.0000 mg | ORAL_TABLET | Freq: Once | ORAL | Status: DC | PRN

## 2021-07-16 MED ORDER — SODIUM CHLORIDE 0.9% FLUSH: 3.0000 mL | INTRAVENOUS | Status: DC | PRN

## 2021-07-16 MED ORDER — LABETALOL HCL 5 MG/ML IV SOLN: 10.0000 mg | INTRAVENOUS | Status: DC | PRN

## 2021-07-16 MED ORDER — CEFAZOLIN SODIUM-DEXTROSE 1-4 GM/50ML-% IV SOLN
INTRAVENOUS | Status: DC | PRN
  Administered 2021-07-16: 2 g via INTRAVENOUS

## 2021-07-16 MED ORDER — MIDAZOLAM HCL 5 MG/5ML IJ SOLN
INTRAMUSCULAR | Status: AC
  Filled 2021-07-16: qty 5

## 2021-07-16 MED ORDER — MIDAZOLAM HCL 2 MG/2ML IJ SOLN
INTRAMUSCULAR | Status: DC | PRN
  Administered 2021-07-16 (×2): 1 mg via INTRAVENOUS

## 2021-07-16 MED ORDER — CEFAZOLIN SODIUM-DEXTROSE 2-4 GM/100ML-% IV SOLN: 2.0000 g | Freq: Once | INTRAVENOUS | Status: DC

## 2021-07-16 MED ORDER — DIPHENHYDRAMINE HCL 50 MG/ML IJ SOLN
INTRAMUSCULAR | Status: AC
  Filled 2021-07-16: qty 1

## 2021-07-16 SURGICAL SUPPLY — 21 items
BALLN LUTONIX 018 4X40X130 (BALLOONS) ×2
BALLN LUTONIX DCB 6X60X130 (BALLOONS) ×2
BALLN ULTRASCORE 014 3X40X150 (BALLOONS) ×2
BALLOON LUTONIX 018 4X40X130 (BALLOONS) ×1 IMPLANT
BALLOON LUTONIX DCB 6X60X130 (BALLOONS) ×1 IMPLANT
BALLOON ULTRSCRE 014 3X40X150 (BALLOONS) ×1 IMPLANT
CANNULA 5F STIFF (CANNULA) ×2 IMPLANT
CATH ANGIO 5F PIGTAIL 65CM (CATHETERS) ×2 IMPLANT
CATH VERT 5FR 125CM (CATHETERS) ×2 IMPLANT
COVER PROBE U/S 5X48 (MISCELLANEOUS) ×2 IMPLANT
DEVICE STARCLOSE SE CLOSURE (Vascular Products) ×2 IMPLANT
GLIDEWIRE ADV .035X260CM (WIRE) ×2 IMPLANT
KIT ENCORE 26 ADVANTAGE (KITS) ×2 IMPLANT
PACK ANGIOGRAPHY (CUSTOM PROCEDURE TRAY) ×2 IMPLANT
SHEATH BRITE TIP 5FRX11 (SHEATH) ×2 IMPLANT
SHEATH DESTIN RDC 6FR 45 (SHEATH) ×2 IMPLANT
STENT LIFESTENT 5F 7X60X135 (Permanent Stent) ×2 IMPLANT
SYR MEDRAD MARK 7 150ML (SYRINGE) ×2 IMPLANT
TUBING CONTRAST HIGH PRESS 72 (TUBING) ×4 IMPLANT
WIRE GUIDERIGHT .035X150 (WIRE) ×2 IMPLANT
WIRE RUNTHROUGH .014X300CM (WIRE) ×2 IMPLANT

## 2021-07-16 NOTE — H&P (Signed)
@LOGO @   MRN : [id number]  Martin [name] is a 60 y.o. (1961-06-09) male who presents with chief complaint of fix my right leg.  History of Present Illness:   The patient returns to [facility] status post successful intervention of the left lower extremity.  He is here today for similar treatment of his right lower extremity.  By the patient's history he notes the pain is always associated with activity and is very consistent day today. Typically, the pain occurs at less than one block, progress is as activity continues to the point that the patient must stop walking. Resting including standing still for several minutes allowed resumption of the activity and the ability to walk a similar distance before stopping again. Uneven terrain and inclined shorten the distance. The pain has been progressive over the past several years. The patient states the inability to walk is now having a profound negative impact on quality of life and daily activities.   The patient denies rest pain or dangling of an extremity off the side of the bed during the night for relief. No open wounds or sores at this time. No prior interventions or surgeries.   No history of back problems or DJD of the lumbar sacral spine.    The patient denies changes in claudication symptoms or new rest pain symptoms.  No new ulcers or wounds of the foot.   The patient's blood pressure has been stable and relatively well controlled. The patient denies amaurosis fugax or recent TIA symptoms. There are no recent neurological changes noted. The patient denies history of DVT, PE or superficial thrombophlebitis. The patient denies recent episodes of angina or shortness of breath.    ABIs obtained 06/04/2021 show Rt=0.73 and Lt=0.78 (monophasic signals bilaterally)  Current Meds  Medication Sig   albuterol (VENTOLIN HFA) 108 (90 Base) MCG/ACT inhaler Inhale 2 puffs into the lungs every 6 (six) hours as needed for  wheezing or shortness of breath.   amoxicillin-clavulanate (AUGMENTIN) 875-125 MG tablet Take 1 tablet by mouth 2 (two) times daily.   apixaban (ELIQUIS) 2.5 MG TABS tablet Take 1 tablet (2.5 mg total) by mouth 2 (two) times daily.   tamsulosin (FLOMAX) 0.4 MG CAPS capsule Take 1 capsule (0.4 mg total) by mouth at bedtime.   umeclidinium-vilanterol (ANORO ELLIPTA) 62.5-25 MCG/INH AEPB Inhale 1 puff into the lungs daily.    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    Anxiety    Arthritis    DDD lumbar, arthritis knees   DJD (degenerative joint disease) of lumbar spine    GERD (gastroesophageal reflux disease)    Hemorrhoid    internal   History of bowel resection    Hypertension    EKG 12/12 EPIC   no  PCP- states increased lately but hasnt been diagnosed formally   Inguinal hernia    Pneumonia    as child, cough at present with no fever   Pneumonia    as child, cough at present with no fever    Radiation 11/19/11-01/08/12   5040 cGy 28 fx Pelvis and inguinal area    Past Surgical History:  Procedure Laterality Date   APPENDECTOMY     age 71 or 41   EXAMINATION UNDER ANESTHESIA  10/14/2011   Procedure: EXAM UNDER ANESTHESIA;  Surgeon: Earnstine Regal, MD;  Location: [facility];  Service:  General;  Laterality: N/A;  exam under anethesia, Excision of mass anal canal, 1.5cm   INGUINAL HERNIA REPAIR  05/11/2012   Procedure: HERNIA REPAIR INGUINAL ADULT;  Surgeon: Earnstine Regal, MD;  Location: [facility];  Service: General;  Laterality: Left;  left inguinal hernia repair with mesh   LOWER EXTREMITY ANGIOGRAPHY Left 07/02/2021   Procedure: LOWER EXTREMITY ANGIOGRAPHY;  Surgeon: Katha Cabal, MD;  Location: [facility];  Service: Cardiovascular;  Laterality: Left;   surgical pathology   10/14/2011   squamous cell ca of anus   transanal excision  10/14/2011   Dr.Todd Gerkin     Social History Social History   Tobacco Use   Smoking status: Every Day    Packs/day: 1.25    Years: 47.00    Pack years: 58.75    Types: Cigarettes   Smokeless tobacco: Never   Tobacco comments:    Cutting back>> Quit Smart card supplied  Substance Use Topics   Alcohol use: Not Currently    Alcohol/week: 6.0 standard drinks    Types: 6 Cans of beer per week    Comment: weekend    6pk weekend, beer    Drug use: Not Currently    Types: Marijuana, Cocaine    Comment: marijuana in past, occasionally now-last use 3-4 weeks ago    Family History Family History  Problem Relation Age of Onset   Colon cancer Father    Asthma Father    Cancer Father        prostate   Hypertension Father    Asthma Mother    Hypertension Mother    Stomach cancer Neg Hx    Esophageal cancer Neg Hx     Allergies  Allergen Reactions   Yellow Jacket Venom [Bee Venom] Anaphylaxis   Aspirin Swelling   Codeine Nausea Only   Morphine And Related Itching     REVIEW OF SYSTEMS (Negative unless checked)  Constitutional: [] Weight loss  [] Fever  [] Chills Cardiac: [] Chest pain   [] Chest pressure   [] Palpitations   [] Shortness of breath when laying flat   [] Shortness of breath with exertion. Vascular:  [x] Pain in legs with walking   [] Pain in legs at rest  [] History of DVT   [] Phlebitis   [] Swelling in legs   [] Varicose veins   [] Non-healing ulcers Pulmonary:   [] Uses home oxygen   [] Productive cough   [] Hemoptysis   [] Wheeze  [] COPD   [] Asthma Neurologic:  [] Dizziness   [] Seizures   [] History of stroke   [] History of TIA  [] Aphasia   [] Vissual changes   [] Weakness or numbness in arm   [] Weakness or numbness in leg Musculoskeletal:   [] Joint swelling   [] Joint pain   [] Low back pain Hematologic:  [] Easy bruising  [] Easy bleeding   [] Hypercoagulable state   [] Anemic Gastrointestinal:  [] Diarrhea   [] Vomiting  [x] Gastroesophageal reflux/heartburn   [] Difficulty swallowing. Genitourinary:  [] Chronic  kidney disease   [] Difficult urination  [] Frequent urination   [] Blood in urine Skin:  [] Rashes   [] Ulcers  Psychological:  [] History of anxiety   []  History of major depression.  Physical Examination  Vitals:   07/16/21 0720  BP: (!) 126/55  Pulse: (!) 55  Resp: 16  Temp: 97.8 F (36.6 C)  TempSrc: Oral  SpO2: 97%  Weight: 63.5 kg  Height: 5' 10"  (1.778 m)   Body mass index is 20.09 kg/m. Gen: WD/WN, NAD Head: [place]/AT, No temporalis wasting.  Ear/Nose/Throat: Hearing grossly intact, nares  w/o erythema or drainage Eyes: PER, EOMI, sclera nonicteric.  Neck: Supple, no masses.  No bruit or JVD.  Pulmonary:  Good air movement, no audible wheezing, no use of accessory muscles.  Cardiac: RRR, normal S1, S2, no Murmurs. Vascular:   Vessel Right Left  Radial Palpable Palpable  PT Not palpable Trace palpable  DP Not palpable Trace palpable  Gastrointestinal: soft, non-distended. No guarding/no peritoneal signs.  Musculoskeletal: M/S 5/5 throughout.  No visible deformity.  Neurologic: CN 2-12 intact. Pain and light touch intact in extremities.  Symmetrical.  Speech is fluent. Motor exam as listed above. Psychiatric: Judgment intact, Mood & affect appropriate for pt's clinical situation. Dermatologic: No rashes or ulcers noted.  No changes consistent with cellulitis.   CBC Lab Results  Component Value Date   WBC 8.5 06/06/2021   HGB 13.1 06/06/2021   HCT 36.9 (L) 06/06/2021   MCV 90 06/06/2021   PLT 163 06/06/2021    BMET    Component Value Date/Time   NA 138 06/06/2021 1134   NA 141 10/19/2014 1204   K 5.0 06/06/2021 1134   K 4.6 10/19/2014 1204   CL 102 06/06/2021 1134   CO2 23 06/06/2021 1134   CO2 26 10/19/2014 1204   GLUCOSE 83 06/06/2021 1134   GLUCOSE 114 (H) 03/10/2020 1219   GLUCOSE 91 10/19/2014 1204   BUN 11 07/16/2021 0733   BUN 12 06/06/2021 1134   BUN 10.8 10/19/2014 1204   CREATININE 0.80 07/16/2021 0733   CREATININE 0.83 07/05/2015 1042    CREATININE 0.9 10/19/2014 1204   CALCIUM 9.2 06/06/2021 1134   CALCIUM 9.6 10/19/2014 1204   GFRNONAA >60 07/16/2021 0733   GFRNONAA >89 07/05/2015 1042   GFRAA >60 03/10/2020 1219   GFRAA >89 07/05/2015 1042   Estimated Creatinine Clearance: 86 mL/min (by C-G formula based on SCr of 0.8 mg/dL).  COAG No results found for: INR, PROTIME  Radiology PERIPHERAL VASCULAR CATHETERIZATION  Result Date: 07/02/2021 See surgical note for result.    Assessment/Plan: 1. Atherosclerosis of native artery of both lower extremities with intermittent claudication (HCC) Recommend:   The patient has experienced increased symptoms and is now describing lifestyle limiting claudication and mild rest pain bilaterally.   Given the severity of the patient's lower extremity symptoms the patient should undergo right leg angiography and intervention as the left leg has now been successfully treated.  Risk and benefits were reviewed the patient.  Indications for the procedure were reviewed.  All questions were answered, the patient agrees to proceed with the right leg.    The patient should continue walking and begin a more formal exercise program.  The patient should continue antiplatelet therapy and aggressive treatment of the lipid abnormalities   The patient will follow up with me after the right lower extremity angiogram.     2. Primary hypertension Continue antihypertensive medications as already ordered, these medications have been reviewed and there are no changes at this time.    3. Gastroesophageal reflux disease, unspecified whether esophagitis present Continue PPI as already ordered, this medication has been reviewed and there are no changes at this time.   Avoidence of caffeine and alcohol   Moderate elevation of the head of the bed     4. 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   Continued activity and therapy was stressed.    [name], MD  07/16/2021 8:12 AM

## 2021-07-16 NOTE — Op Note (Signed)
[facility]  Percutaneous Study/Intervention Procedural Note   Date of Surgery: 07/16/2021  Surgeon:  Katha Cabal, MD.  Pre-operative Diagnosis: Atherosclerotic occlusive disease bilateral lower extremities with lifestyle limiting claudication and rest pain symptoms of the right lower extremity  Post-operative diagnosis:  Same  Procedure(s) Performed:             1.  Introduction catheter into right lower extremity 3rd order catheter placement              2.    Contrast injection right lower extremity for distal runoff             3.  Percutaneous transluminal angioplasty and stent placement right superficial femoral artery              4.  Percutaneous transluminal angioplasty to 4 mm with a Lutonix drug-eluting balloon right posterior tibial artery.                5.  Star close closure left common femoral arteriotomy  Anesthesia: Conscious sedation was administered under my direct supervision by the interventional radiology RN. IV Versed plus fentanyl were utilized. Continuous ECG, pulse oximetry and blood pressure was monitored throughout the entire procedure.  Conscious sedation was for a total of 52 minutes.  Sheath: 6 French destination left common femoral retrograde  Contrast: 60 cc  Fluoroscopy Time: 6.6 minutes  Indications:  Martin [name] presents with worsening pain in his right lower extremity.  He has severe atherosclerotic occlusive disease of the right lower extremity.  Noninvasive studies as well as physical examination support this.  This places him at risk for potential limb loss.  Angiography with the hope for intervention to prevent limb loss is recommended.  The risks and benefits are reviewed all questions answered patient agrees to proceed.  Procedure:  Martin [name] is a 60 y.o. y.o. male who was identified and appropriate procedural time out was performed.  The patient was then placed supine on the table and prepped and draped  in the usual sterile fashion.    Ultrasound was placed in the sterile sleeve and the left groin was evaluated the left common femoral artery was echolucent and pulsatile indicating patency.  Image was recorded for the permanent record and under real-time visualization a microneedle was inserted into the common femoral artery microwire followed by a micro-sheath.  A J-wire was then advanced through the micro-sheath and a  5 [place] sheath was then inserted over a J-wire. J-wire was then advanced and a 5 French pigtail catheter was positioned in the distal aorta. An LAO view of the pelvis was obtained.  Subsequently a pigtail catheter with the stiff angle Glidewire was used to cross the aortic bifurcation the catheter wire were advanced down into the right distal external iliac artery. Oblique view of the femoral bifurcation was then obtained and subsequently the wire was reintroduced and the pigtail catheter negotiated into the SFA representing third order catheter placement. Distal runoff was then performed.  Diagnostic interpretation: Previous aortogram demonstrated the visceral segment and infrarenal aorta is widely patent.  Imaging today demonstrates the aortic bifurcation is widely patent.  The right common iliac artery is widely patent as is the external iliac artery which demonstrates the previously placed stent.  This is widely patent with less than 10% residual stenosis.  The right common femoral demonstrates moderate disease with atherosclerotic changes creating 30 to 40% diameter reduction.  The profunda femoris is diffusely diseased  and collateralizes poorly distally.  The SFA demonstrates a greater than 90% stenosis over a distance of approximately 50 mm in the proximal SFA.  Distal to this lesion the SFA is diffusely diseased but there are no hemodynamically significant lesions.  Trifurcation demonstrates increasing disease.  The distal tibioperoneal trunk and the origins of the peroneal and  posterior tibial demonstrate a napkin ring like lesion of greater than 80%.  Distal to these ostial lesions the peroneal and the posterior tibial are patent with the posterior tibial being the dominant runoff to the foot.  Anterior tibial occludes shortly after its origin remains occluded down to the level of the ankle where peroneal collaterals reconstitute the dorsalis pedis.  Based on these images I elected to move forward with intervention.  5000 units of heparin was then given and allowed to circulate and a 6 [place] destination sheath was advanced up and over the bifurcation and positioned in the proximal common femoral artery.  The advantage wire was negotiated through the proximal SFA lesion and down into the distal popliteal.  A 7 mm x 60 mm life stent is then deployed across this lesion.  A 6 mm x 60 mm Lutonix drug-eluting balloon is then advanced through the stent and the stent is postdilated with an inflation to 10 atm for 1 minute.  Follow-up imaging demonstrates less than 10% residual stenosis.  Distal runoff is preserved and the detector is now repositioned over the trifurcation.  A Kumpe catheter is then negotiated over the advantage wire and positioned with its tip in the distal popliteal.  Hand-injection contrast and magnified imaging was then used to create an image of the trifurcation.  A 0.014 run-through wire is then negotiated into the posterior tibial artery with the assistance of the Kumpe catheter.  A 3 mm x 40 mm ultra score balloon was then advanced across the ostial lesion of the posterior tibial inflated to 10 atm for 1 minute.  Follow-up imaging demonstrates approximately 30 to 40% residual stenosis and a 4 mm x 40 mm Lutonix drug-eluting balloon is advanced across this lesion inflated to approximately 6 atm for 1 minute.  Follow-up imaging now demonstrates less than 10% residual stenosis with preservation of the distal runoff.    After review of these images the sheath is  pulled into the left external iliac oblique of the common femoral is obtained and a Star close device deployed. There no immediate complications.   Findings:   Previous aortogram demonstrated the visceral segment and infrarenal aorta is widely patent.  Imaging today demonstrates the aortic bifurcation is widely patent.  The right common iliac artery is widely patent as is the external iliac artery which demonstrates the previously placed stent.  This is widely patent with less than 10% residual stenosis.  The right common femoral demonstrates moderate disease with atherosclerotic changes creating 30 to 40% diameter reduction.  The profunda femoris is diffusely diseased and collateralizes poorly distally.  The SFA demonstrates a greater than 90% stenosis over a distance of approximately 50 mm in the proximal SFA.  Distal to this lesion the SFA is diffusely diseased but there are no hemodynamically significant lesions.  Trifurcation demonstrates increasing disease.  The distal tibioperoneal trunk and the origins of the peroneal and posterior tibial demonstrate a napkin ring like lesion of greater than 80%.  Distal to these ostial lesions the peroneal and the posterior tibial are patent with the posterior tibial being the dominant runoff to the foot.  Anterior tibial occludes  shortly after its origin remains occluded down to the level of the ankle where peroneal collaterals reconstitute the dorsalis pedis.  Following angioplasty posterior tibial now is in-line flow and looks quite nice with less than 10% residual stenosis. Angioplasty of the SFA and stent placement yields an excellent result with less than 10% residual stenosis.  There is preservation of distal runoff    Summary: Successful recanalization right lower extremity for limb salvage                        Disposition: Patient was taken to the recovery room in stable condition having tolerated the procedure well.  Chicquita Mendel, Dolores Lory 07/16/2021,9:45 AM

## 2021-07-16 NOTE — Interval H&P Note (Signed)
History and Physical Interval Note:  07/16/2021 8:16 AM  Martin [name]  has presented today for surgery, with the diagnosis of RLE Angiography   ASO with claudication   BARD Rep  cc: Judi Cong.  The various methods of treatment have been discussed with the patient and family. After consideration of risks, benefits and other options for treatment, the patient has consented to  Procedure(s): LOWER EXTREMITY ANGIOGRAPHY (Right) as a surgical intervention.  The patient's history has been reviewed, patient examined, no change in status, stable for surgery.  I have reviewed the patient's chart and labs.  Questions were answered to the patient's satisfaction.     [name]

## 2021-07-16 NOTE — Progress Notes (Signed)
Patient continues to sleep. Received Benadryl 50 mg IV during procedure. Called and spoke with daughter to inform her of delay for discharge. Patient arouses to touch and name call but states unable to keep "eyes open." Will continue to monitor until such time he can sit up and eat a meal and take fluids.

## 2021-07-29 ENCOUNTER — Telehealth (INDEPENDENT_AMBULATORY_CARE_PROVIDER_SITE_OTHER): Payer: Self-pay

## 2021-07-29 NOTE — Telephone Encounter (Signed)
He has abis on Monday.  Due to the holiday, we will be unable to move up the appointment.  We can evaluate pain at this follow up.  If he feels he needs to be seen sooner, he can go the ED. I do not have any paperwork currently

## 2021-07-29 NOTE — Telephone Encounter (Signed)
Patient call in stating his left leg is hurting at the calf and hip, patient states his left calf is smaller than his right calf. Patient also states that he has pain in his left groin area as well. Patient wanted to get the paperwork to have his teeth removed as well. Patient had a left lower extremity angio with Dr. Delana Meyer on 07/16/21.

## 2021-07-29 NOTE — Telephone Encounter (Signed)
I attempted to contact the patient to give him the recommendation from Eulogio Ditch NP and a message was left for a return call. See notes below.

## 2021-07-31 ENCOUNTER — Other Ambulatory Visit: Payer: Medicare Other

## 2021-07-31 ENCOUNTER — Encounter (INDEPENDENT_AMBULATORY_CARE_PROVIDER_SITE_OTHER): Payer: Self-pay

## 2021-07-31 ENCOUNTER — Other Ambulatory Visit (INDEPENDENT_AMBULATORY_CARE_PROVIDER_SITE_OTHER): Payer: Self-pay | Admitting: Vascular Surgery

## 2021-07-31 ENCOUNTER — Other Ambulatory Visit: Payer: Self-pay

## 2021-07-31 DIAGNOSIS — E785 Hyperlipidemia, unspecified: Secondary | ICD-10-CM

## 2021-07-31 DIAGNOSIS — Z95820 Peripheral vascular angioplasty status with implants and grafts: Secondary | ICD-10-CM

## 2021-07-31 DIAGNOSIS — I70223 Atherosclerosis of native arteries of extremities with rest pain, bilateral legs: Secondary | ICD-10-CM

## 2021-08-01 LAB — LIPID PANEL
Chol/HDL Ratio: 3.4 ratio (ref 0.0–5.0)
Cholesterol, Total: 120 mg/dL (ref 100–199)
HDL: 35 mg/dL — ABNORMAL LOW (ref >39)
LDL Chol Calc (NIH): 61 mg/dL (ref 0–99)
Triglycerides: 137 mg/dL (ref 0–149)
VLDL Cholesterol Cal: 24 mg/dL (ref 5–40)

## 2021-08-02 NOTE — Progress Notes (Signed)
Please call patient with update.   Continue Atorvastatin (Lipitor) for cholesterol maintenance.

## 2021-08-05 ENCOUNTER — Ambulatory Visit (INDEPENDENT_AMBULATORY_CARE_PROVIDER_SITE_OTHER): Payer: Medicare Other | Admitting: Vascular Surgery

## 2021-08-05 ENCOUNTER — Other Ambulatory Visit: Payer: Self-pay

## 2021-08-05 ENCOUNTER — Encounter (INDEPENDENT_AMBULATORY_CARE_PROVIDER_SITE_OTHER): Payer: Self-pay | Admitting: Vascular Surgery

## 2021-08-05 ENCOUNTER — Ambulatory Visit (INDEPENDENT_AMBULATORY_CARE_PROVIDER_SITE_OTHER): Payer: Medicare Other

## 2021-08-05 ENCOUNTER — Other Ambulatory Visit: Payer: Self-pay | Admitting: Family Medicine

## 2021-08-05 VITALS — BP 145/61 | HR 66 | Resp 16 | Wt 138.0 lb

## 2021-08-05 DIAGNOSIS — M47816 Spondylosis without myelopathy or radiculopathy, lumbar region: Secondary | ICD-10-CM

## 2021-08-05 DIAGNOSIS — I70219 Atherosclerosis of native arteries of extremities with intermittent claudication, unspecified extremity: Secondary | ICD-10-CM | POA: Diagnosis not present

## 2021-08-05 DIAGNOSIS — I70223 Atherosclerosis of native arteries of extremities with rest pain, bilateral legs: Secondary | ICD-10-CM | POA: Diagnosis not present

## 2021-08-05 DIAGNOSIS — E785 Hyperlipidemia, unspecified: Secondary | ICD-10-CM

## 2021-08-05 DIAGNOSIS — K219 Gastro-esophageal reflux disease without esophagitis: Secondary | ICD-10-CM

## 2021-08-05 DIAGNOSIS — I10 Essential (primary) hypertension: Secondary | ICD-10-CM | POA: Diagnosis not present

## 2021-08-05 DIAGNOSIS — I70213 Atherosclerosis of native arteries of extremities with intermittent claudication, bilateral legs: Secondary | ICD-10-CM | POA: Diagnosis not present

## 2021-08-05 DIAGNOSIS — Z95820 Peripheral vascular angioplasty status with implants and grafts: Secondary | ICD-10-CM

## 2021-08-05 NOTE — H&P (Signed)
MRN : [id number]  Martin [name] is a 60 y.o. (12-31-1960) male who presents with chief complaint of check legs.  History of Present Illness:   The patient returns to the office for followup and review status post angiogram with intervention.   Procedure 07/16/2021:  Percutaneous transluminal angioplasty and stent placement right superficial femoral artery  2.    Percutaneous transluminal angioplasty to 4 mm with a Lutonix drug-eluting balloon right posterior tibial artery  The patient notes improvement in the right lower extremity symptoms but his left lower extremity he feels is worse with a significant shortening of his claudication distance. No interval shortening of the patient's claudication distance or rest pain symptoms for the right but again on the left he feels that he is now having pain at rest.  No new ulcers or wounds have occurred since the last visit.  There have been no significant changes to the patient's overall health care.  The patient denies amaurosis fugax or recent TIA symptoms. There are no recent neurological changes noted. The patient denies history of DVT, PE or superficial thrombophlebitis. The patient denies recent episodes of angina or shortness of breath.   ABI's Rt= 1.12 and Lt= 1.10 (previous ABI's Rt= 0.73 and Lt= 0.78)  Current Meds  Medication Sig   albuterol (VENTOLIN HFA) 108 (90 Base) MCG/ACT inhaler Inhale 2 puffs into the lungs every 6 (six) hours as needed for wheezing or shortness of breath.   apixaban (ELIQUIS) 2.5 MG TABS tablet Take 1 tablet (2.5 mg total) by mouth 2 (two) times daily.   EPINEPHrine (EPIPEN 2-PAK) 0.3 mg/0.3 mL IJ SOAJ injection Inject 0.3 mLs (0.3 mg total) into the muscle as needed for anaphylaxis.   tamsulosin (FLOMAX) 0.4 MG CAPS capsule Take 1 capsule (0.4 mg total) by mouth at bedtime.   umeclidinium-vilanterol (ANORO ELLIPTA) 62.5-25 MCG/INH AEPB Inhale 1 puff into the lungs daily.    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    Anxiety    Arthritis    DDD lumbar, arthritis knees   DJD (degenerative joint disease) of lumbar spine    GERD (gastroesophageal reflux disease)    Hemorrhoid    internal   History of bowel resection    Hypertension    EKG 12/12 EPIC   no  PCP- states increased lately but hasnt been diagnosed formally   Inguinal hernia    Pneumonia    as child, cough at present with no fever   Pneumonia    as child, cough at present with no fever    Radiation 11/19/11-01/08/12   5040 cGy 28 fx Pelvis and inguinal area    Past Surgical History:  Procedure Laterality Date   APPENDECTOMY     age 75 or 79   EXAMINATION UNDER ANESTHESIA  10/14/2011   Procedure: EXAM UNDER ANESTHESIA;  Surgeon: Earnstine Regal, MD;  Location: [facility];  Service: General;  Laterality: N/A;  exam under anethesia, Excision of mass anal canal, 1.5cm   INGUINAL HERNIA REPAIR  05/11/2012   Procedure: HERNIA REPAIR INGUINAL ADULT;  Surgeon: Earnstine Regal, MD;  Location: [facility];  Service: General;  Laterality: Left;  left inguinal hernia repair with mesh   LOWER EXTREMITY ANGIOGRAPHY Left 07/02/2021   Procedure: LOWER EXTREMITY ANGIOGRAPHY;  Surgeon: Katha Cabal, MD;  Location: [facility];  Service:  Cardiovascular;  Laterality: Left;   LOWER EXTREMITY ANGIOGRAPHY Right 07/16/2021   Procedure: LOWER EXTREMITY ANGIOGRAPHY;  Surgeon: Katha Cabal, MD;  Location: [facility];  Service: Cardiovascular;  Laterality: Right;   surgical pathology   10/14/2011   squamous cell ca of anus   transanal excision  10/14/2011   Dr.Todd Gerkin    Social History Social History   Tobacco Use   Smoking status: Every Day    Packs/day: 1.25    Years: 47.00    Pack years: 58.75    Types: Cigarettes   Smokeless tobacco: Never   Tobacco comments:    Cutting back>> Quit Smart card supplied   Substance Use Topics   Alcohol use: Not Currently    Alcohol/week: 6.0 standard drinks    Types: 6 Cans of beer per week    Comment: weekend    6pk weekend, beer    Drug use: Not Currently    Types: Marijuana, Cocaine    Comment: marijuana in past, occasionally now-last use 3-4 weeks ago    Family History Family History  Problem Relation Age of Onset   Colon cancer Father    Asthma Father    Cancer Father        prostate   Hypertension Father    Asthma Mother    Hypertension Mother    Stomach cancer Neg Hx    Esophageal cancer Neg Hx     Allergies  Allergen Reactions   Yellow Jacket Venom [Bee Venom] Anaphylaxis   Aspirin Swelling   Codeine Nausea Only   Morphine And Related Itching     REVIEW OF SYSTEMS (Negative unless checked)  Constitutional: [] Weight loss  [] Fever  [] Chills Cardiac: [] Chest pain   [] Chest pressure   [] Palpitations   [] Shortness of breath when laying flat   [] Shortness of breath with exertion. Vascular:  [x] Pain in legs with walking   [x] Pain in legs at rest  [] History of DVT   [] Phlebitis   [] Swelling in legs   [] Varicose veins   [] Non-healing ulcers Pulmonary:   [] Uses home oxygen   [] Productive cough   [] Hemoptysis   [] Wheeze  [] COPD   [] Asthma Neurologic:  [] Dizziness   [] Seizures   [] History of stroke   [] History of TIA  [] Aphasia   [] Vissual changes   [] Weakness or numbness in arm   [] Weakness or numbness in leg Musculoskeletal:   [] Joint swelling   [] Joint pain   [] Low back pain Hematologic:  [] Easy bruising  [] Easy bleeding   [] Hypercoagulable state   [] Anemic Gastrointestinal:  [] Diarrhea   [] Vomiting  [x] Gastroesophageal reflux/heartburn   [] Difficulty swallowing. Genitourinary:  [] Chronic kidney disease   [] Difficult urination  [] Frequent urination   [] Blood in urine Skin:  [] Rashes   [] Ulcers  Psychological:  [] History of anxiety   []  History of major depression.  Physical Examination  Vitals:   08/05/21 1105  BP: (!) 145/61   Pulse: 66  Resp: 16  Weight: 138 lb (62.6 kg)   Body mass index is 19.8 kg/m. Gen: WD/WN, NAD Head: [place]/AT, No temporalis wasting.  Ear/Nose/Throat: Hearing grossly intact, nares w/o erythema or drainage Eyes: PER, EOMI, sclera nonicteric.  Neck: Supple, no masses.  No bruit or JVD.  Pulmonary:  Good air movement, no audible wheezing, no use of accessory muscles.  Cardiac: RRR, normal S1, S2, no Murmurs. Vascular:   On the left calf muscle soft there is no evidence of bruising or hematoma Vessel Right Left  Radial Palpable Palpable  PT Palpable Not Palpable  DP Palpable Trace Palpable  Gastrointestinal: soft, non-distended. No guarding/no peritoneal signs.  Musculoskeletal: M/S 5/5 throughout.  No visible deformity.  Neurologic: CN 2-12 intact. Pain and light touch intact in extremities.  Symmetrical.  Speech is fluent. Motor exam as listed above. Psychiatric: Judgment intact, Mood & affect appropriate for pt's clinical situation. Dermatologic: No rashes or ulcers noted.  No changes consistent with cellulitis.   CBC Lab Results  Component Value Date   WBC 8.5 06/06/2021   HGB 13.1 06/06/2021   HCT 36.9 (L) 06/06/2021   MCV 90 06/06/2021   PLT 163 06/06/2021    BMET    Component Value Date/Time   NA 138 06/06/2021 1134   NA 141 10/19/2014 1204   K 5.0 06/06/2021 1134   K 4.6 10/19/2014 1204   CL 102 06/06/2021 1134   CO2 23 06/06/2021 1134   CO2 26 10/19/2014 1204   GLUCOSE 83 06/06/2021 1134   GLUCOSE 114 (H) 03/10/2020 1219   GLUCOSE 91 10/19/2014 1204   BUN 11 07/16/2021 0733   BUN 12 06/06/2021 1134   BUN 10.8 10/19/2014 1204   CREATININE 0.80 07/16/2021 0733   CREATININE 0.83 07/05/2015 1042   CREATININE 0.9 10/19/2014 1204   CALCIUM 9.2 06/06/2021 1134   CALCIUM 9.6 10/19/2014 1204   GFRNONAA >60 07/16/2021 0733   GFRNONAA >89 07/05/2015 1042   GFRAA >60 03/10/2020 1219   GFRAA >89 07/05/2015 1042   Estimated Creatinine Clearance: 84.8 mL/min (by C-G  formula based on SCr of 0.8 mg/dL).  COAG No results found for: INR, PROTIME  Radiology PERIPHERAL VASCULAR CATHETERIZATION  Result Date: 07/16/2021 See surgical note for result.    Assessment/Plan 1. Atherosclerosis of native artery of both lower extremities with intermittent claudication (HCC) Recommend:  The patient has evidence of severe atherosclerotic changes of both lower extremities with rest pain that is associated with preulcerative changes and impending tissue loss of the left foot.  This represents a limb threatening ischemia and places the patient at the risk for limb loss.  I have reviewed the angiogram images with the patient.  He is pointing specifically to the medial calf area and this is the area that is supported by the posterior tibial and peroneal primarily.  This is the distribution that was not completely revascularized at the time of his angiogram.  It does appear that the atherosclerotic changes in the tibioperoneal trunk and posterior tibial are treatable.  Based on this he has requested that we move forward with revascularization.  Patient should undergo angiography of the lower extremities with the hope for intervention for limb salvage.  The risks and benefits as well as the alternative therapies was discussed in detail with the patient.  All questions were answered.  Patient agrees to proceed with left lower extremity angiography.  The patient will follow up with me in the office after the procedure.     2. Primary hypertension Continue antihypertensive medications as already ordered, these medications have been reviewed and there are no changes at this time.   3. Spondylosis of lumbar region without myelopathy or radiculopathy I have discussed with him the if we successfully revascularize the tibioperoneal trunk posterior tibial and peroneal and his pain persists that this most likely is secondary to his LS spine disease.  He acknowledges this.  Continue  NSAID medications as already ordered, these medications have been reviewed and there are no changes at this time.  Continued activity and therapy was stressed.  4. Hyperlipidemia, unspecified hyperlipidemia type Continue statin as ordered and reviewed, no changes at this time   5. Gastroesophageal reflux disease without esophagitis Continue PPI as already ordered, this medication has been reviewed and there are no changes at this time.  Avoidence of caffeine and alcohol  Moderate elevation of the head of the bed     [name], MD  08/05/2021 11:12 AM

## 2021-08-05 NOTE — Progress Notes (Deleted)
  The note originally documented on this encounter has been moved the the encounter in which it belongs.  

## 2021-08-06 ENCOUNTER — Encounter (INDEPENDENT_AMBULATORY_CARE_PROVIDER_SITE_OTHER): Payer: Self-pay | Admitting: Vascular Surgery

## 2021-08-08 ENCOUNTER — Telehealth (INDEPENDENT_AMBULATORY_CARE_PROVIDER_SITE_OTHER): Payer: Self-pay

## 2021-08-08 NOTE — Telephone Encounter (Signed)
Spoke with the patient and he is scheduled with Dr. Delana Meyer for a left leg angio with MAC anesthesia on 08/27/21 at the [facility]. Pre-procedure instructions were discussed and will be mailed.

## 2021-08-23 ENCOUNTER — Institutional Professional Consult (permissible substitution): Payer: Medicare Other | Admitting: Internal Medicine

## 2021-08-27 ENCOUNTER — Ambulatory Visit
Admission: RE | Admit: 2021-08-27 | Discharge: 2021-08-27 | Disposition: A | Discharge status: Home / Self Care | Payer: Medicare Other | Attending: Vascular Surgery | Admitting: Vascular Surgery

## 2021-08-27 ENCOUNTER — Ambulatory Visit: Payer: Medicare Other | Admitting: Anesthesiology

## 2021-08-27 ENCOUNTER — Encounter
Admission: RE | Disposition: A | Discharge status: Home / Self Care | Payer: Self-pay | Source: Home / Self Care | Attending: Vascular Surgery

## 2021-08-27 ENCOUNTER — Other Ambulatory Visit: Payer: Self-pay

## 2021-08-27 ENCOUNTER — Other Ambulatory Visit (INDEPENDENT_AMBULATORY_CARE_PROVIDER_SITE_OTHER): Payer: Self-pay | Admitting: Nurse Practitioner

## 2021-08-27 ENCOUNTER — Encounter: Payer: Self-pay | Admitting: Vascular Surgery

## 2021-08-27 DIAGNOSIS — K219 Gastro-esophageal reflux disease without esophagitis: Secondary | ICD-10-CM | POA: Insufficient documentation

## 2021-08-27 DIAGNOSIS — M47816 Spondylosis without myelopathy or radiculopathy, lumbar region: Secondary | ICD-10-CM | POA: Insufficient documentation

## 2021-08-27 DIAGNOSIS — I70223 Atherosclerosis of native arteries of extremities with rest pain, bilateral legs: Secondary | ICD-10-CM

## 2021-08-27 DIAGNOSIS — I10 Essential (primary) hypertension: Secondary | ICD-10-CM | POA: Diagnosis not present

## 2021-08-27 DIAGNOSIS — I70213 Atherosclerosis of native arteries of extremities with intermittent claudication, bilateral legs: Secondary | ICD-10-CM | POA: Insufficient documentation

## 2021-08-27 DIAGNOSIS — E785 Hyperlipidemia, unspecified: Secondary | ICD-10-CM | POA: Insufficient documentation

## 2021-08-27 DIAGNOSIS — I70219 Atherosclerosis of native arteries of extremities with intermittent claudication, unspecified extremity: Secondary | ICD-10-CM

## 2021-08-27 DIAGNOSIS — I70222 Atherosclerosis of native arteries of extremities with rest pain, left leg: Secondary | ICD-10-CM

## 2021-08-27 HISTORY — DX: Peripheral vascular disease, unspecified: I73.9

## 2021-08-27 HISTORY — PX: LOWER EXTREMITY ANGIOGRAPHY: CATH118251

## 2021-08-27 LAB — CREATININE, SERUM
Creatinine, Ser: 0.7 mg/dL (ref 0.61–1.24)
GFR, Estimated: >60 mL/min (ref >60)

## 2021-08-27 LAB — BUN: BUN: 10 mg/dL (ref 8–23)

## 2021-08-27 SURGERY — LOWER EXTREMITY ANGIOGRAPHY
Anesthesia: General | Laterality: Left

## 2021-08-27 MED ORDER — SODIUM CHLORIDE 0.9 % IV SOLN: INTRAVENOUS | Status: DC

## 2021-08-27 MED ORDER — FAMOTIDINE 20 MG PO TABS: 40.0000 mg | ORAL_TABLET | Freq: Once | ORAL | Status: DC | PRN

## 2021-08-27 MED ORDER — HYDRALAZINE HCL 20 MG/ML IJ SOLN: 5.0000 mg | INTRAMUSCULAR | Status: DC | PRN

## 2021-08-27 MED ORDER — HEPARIN SODIUM (PORCINE) 1000 UNIT/ML IJ SOLN
INTRAMUSCULAR | Status: DC | PRN
  Administered 2021-08-27: 4000 [IU] via INTRAVENOUS

## 2021-08-27 MED ORDER — PROPOFOL 10 MG/ML IV BOLUS
INTRAVENOUS | Status: DC | PRN
  Administered 2021-08-27: 70 mg via INTRAVENOUS

## 2021-08-27 MED ORDER — FENTANYL CITRATE (PF) 100 MCG/2ML IJ SOLN
INTRAMUSCULAR | Status: DC | PRN
  Administered 2021-08-27: 25 ug via INTRAVENOUS

## 2021-08-27 MED ORDER — PROPOFOL 500 MG/50ML IV EMUL
INTRAVENOUS | Status: AC
  Filled 2021-08-27: qty 50

## 2021-08-27 MED ORDER — GLYCOPYRROLATE 0.2 MG/ML IJ SOLN
INTRAMUSCULAR | Status: AC
  Filled 2021-08-27: qty 1

## 2021-08-27 MED ORDER — ONDANSETRON HCL 4 MG/2ML IJ SOLN: 4.0000 mg | Freq: Four times a day (QID) | INTRAMUSCULAR | Status: DC | PRN

## 2021-08-27 MED ORDER — LABETALOL HCL 5 MG/ML IV SOLN: 10.0000 mg | INTRAVENOUS | Status: DC | PRN

## 2021-08-27 MED ORDER — OXYCODONE HCL 5 MG PO TABS: 5.0000 mg | ORAL_TABLET | ORAL | Status: DC | PRN

## 2021-08-27 MED ORDER — FENTANYL CITRATE (PF) 100 MCG/2ML IJ SOLN
INTRAMUSCULAR | Status: AC
  Filled 2021-08-27: qty 2

## 2021-08-27 MED ORDER — PHENYLEPHRINE HCL-NACL 20-0.9 MG/250ML-% IV SOLN
INTRAVENOUS | Status: AC
  Filled 2021-08-27: qty 250

## 2021-08-27 MED ORDER — FENTANYL CITRATE (PF) 100 MCG/2ML IJ SOLN: 25.0000 ug | INTRAMUSCULAR | Status: DC | PRN

## 2021-08-27 MED ORDER — FENTANYL CITRATE PF 50 MCG/ML IJ SOSY: 12.5000 ug | PREFILLED_SYRINGE | Freq: Once | INTRAMUSCULAR | Status: DC | PRN

## 2021-08-27 MED ORDER — PROPOFOL 500 MG/50ML IV EMUL
INTRAVENOUS | Status: DC | PRN
  Administered 2021-08-27: 120 ug/kg/min via INTRAVENOUS

## 2021-08-27 MED ORDER — MORPHINE SULFATE (PF) 4 MG/ML IV SOLN: 2.0000 mg | INTRAVENOUS | Status: DC | PRN

## 2021-08-27 MED ORDER — CEFAZOLIN SODIUM-DEXTROSE 2-4 GM/100ML-% IV SOLN
2.0000 g | Freq: Once | INTRAVENOUS | Status: AC
  Administered 2021-08-27: 12:00:00 2 g via INTRAVENOUS

## 2021-08-27 MED ORDER — MIDAZOLAM HCL 2 MG/ML PO SYRP: 8.0000 mg | ORAL_SOLUTION | Freq: Once | ORAL | Status: DC | PRN

## 2021-08-27 MED ORDER — DEXMEDETOMIDINE HCL IN NACL 200 MCG/50ML IV SOLN
INTRAVENOUS | Status: AC
  Filled 2021-08-27: qty 50

## 2021-08-27 MED ORDER — ACETAMINOPHEN 325 MG PO TABS: 650.0000 mg | ORAL_TABLET | ORAL | Status: DC | PRN

## 2021-08-27 MED ORDER — SODIUM CHLORIDE 0.9% FLUSH: 3.0000 mL | INTRAVENOUS | Status: DC | PRN

## 2021-08-27 MED ORDER — DEXMEDETOMIDINE HCL IN NACL 200 MCG/50ML IV SOLN
INTRAVENOUS | Status: DC | PRN
  Administered 2021-08-27: 4 ug via INTRAVENOUS
  Administered 2021-08-27 (×2): 8 ug via INTRAVENOUS

## 2021-08-27 MED ORDER — GLYCOPYRROLATE 0.2 MG/ML IJ SOLN
INTRAMUSCULAR | Status: DC | PRN
  Administered 2021-08-27: .2 mg via INTRAVENOUS

## 2021-08-27 MED ORDER — METHYLPREDNISOLONE SODIUM SUCC 125 MG IJ SOLR: 125.0000 mg | Freq: Once | INTRAMUSCULAR | Status: DC | PRN

## 2021-08-27 MED ORDER — ONDANSETRON HCL 4 MG/2ML IJ SOLN: 4.0000 mg | Freq: Once | INTRAMUSCULAR | Status: DC | PRN

## 2021-08-27 MED ORDER — DIPHENHYDRAMINE HCL 50 MG/ML IJ SOLN: 50.0000 mg | Freq: Once | INTRAMUSCULAR | Status: DC | PRN

## 2021-08-27 MED ORDER — EPHEDRINE 5 MG/ML INJ
INTRAVENOUS | Status: AC
  Filled 2021-08-27: qty 5

## 2021-08-27 MED ORDER — SODIUM CHLORIDE 0.9% FLUSH: 3.0000 mL | Freq: Two times a day (BID) | INTRAVENOUS | Status: DC

## 2021-08-27 MED ORDER — EPHEDRINE SULFATE 50 MG/ML IJ SOLN
INTRAMUSCULAR | Status: DC | PRN
  Administered 2021-08-27: 10 mg via INTRAVENOUS

## 2021-08-27 MED ORDER — SODIUM CHLORIDE 0.9 % IV SOLN: 250.0000 mL | INTRAVENOUS | Status: DC | PRN

## 2021-08-27 SURGICAL SUPPLY — 17 items
BALLN LUTONIX 7X80X130 (BALLOONS) ×3
BALLN LUTONIX DCB 6X40X130 (BALLOONS) ×3
BALLOON LUTONIX 7X80X130 (BALLOONS) IMPLANT
BALLOON LUTONIX DCB 6X40X130 (BALLOONS) IMPLANT
CANNULA 5F STIFF (CANNULA) ×2 IMPLANT
CATH PIG 70CM (CATHETERS) ×2 IMPLANT
COVER PROBE U/S 5X48 (MISCELLANEOUS) ×4 IMPLANT
DEVICE SAFEGUARD 24CM (GAUZE/BANDAGES/DRESSINGS) ×2 IMPLANT
DEVICE STARCLOSE SE CLOSURE (Vascular Products) ×2 IMPLANT
GLIDEWIRE ADV .035X260CM (WIRE) ×2 IMPLANT
KIT ENCORE 26 ADVANTAGE (KITS) ×2 IMPLANT
PACK ANGIOGRAPHY (CUSTOM PROCEDURE TRAY) ×3 IMPLANT
SHEATH ANL2 6FRX45 HC (SHEATH) ×2 IMPLANT
SHEATH PINNACLE 5F 10CM (SHEATH) ×2 IMPLANT
SYR MEDRAD MARK 7 150ML (SYRINGE) ×2 IMPLANT
TUBING CONTRAST HIGH PRESS 72 (TUBING) ×2 IMPLANT
WIRE GUIDERIGHT .035X150 (WIRE) ×2 IMPLANT

## 2021-08-27 NOTE — Transfer of Care (Signed)
Immediate Anesthesia Transfer of Care Note  Patient: KAMARIAN SAHAKIAN  Procedure(s) Performed: LOWER EXTREMITY ANGIOGRAPHY (Left)  Patient Location: PACU  Anesthesia Type:General  Level of Consciousness: drowsy and patient cooperative  Airway & Oxygen Therapy: Patient Spontanous Breathing and Patient connected to nasal cannula oxygen  Post-op Assessment: Report given to RN and Post -op Vital signs reviewed and stable  Post vital signs: Reviewed and stable  Last Vitals:  Vitals Value Taken Time  BP 111/47 08/27/21 1314  Temp    Pulse 54 08/27/21 1314  Resp 10 08/27/21 1314  SpO2 99 % 08/27/21 1314  Vitals shown include unvalidated device data.  Last Pain:  Vitals:   08/27/21 1313  TempSrc:   PainSc: Asleep         Complications: No notable events documented.

## 2021-08-27 NOTE — Anesthesia Preprocedure Evaluation (Addendum)
Anesthesia Evaluation  Patient identified by MRN, date of birth, ID band Patient awake    Reviewed: Allergy & Precautions, NPO status , Patient's Chart, lab work & pertinent test results  Airway Mallampati: III  TM Distance: >3 FB Neck ROM: full    Dental  (+) Missing, Poor Dentition, Chipped, Dental Advisory Given   Pulmonary neg pulmonary ROS, COPD,  COPD inhaler, Current Smoker,    Pulmonary exam normal  + decreased breath sounds      Cardiovascular Exercise Tolerance: Poor hypertension, Pt. on medications + Peripheral Vascular Disease  negative cardio ROS Normal cardiovascular exam Rhythm:Regular Rate:Normal     Neuro/Psych Anxiety Depression negative neurological ROS  negative psych ROS   GI/Hepatic negative GI ROS, Neg liver ROS, GERD  ,  Endo/Other  negative endocrine ROS  Renal/GU negative Renal ROS  negative genitourinary   Musculoskeletal negative musculoskeletal ROS (+)   Abdominal Normal abdominal exam  (+)   Peds  Hematology negative hematology ROS (+)   Anesthesia Other Findings Past Medical History: 10/2011: Allergy 10/14/2011: Anal cancer ([place])     Comment:  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  No date: Anxiety No date: Arthritis     Comment:  DDD lumbar, arthritis knees No date: DJD (degenerative joint disease) of lumbar spine No date: GERD (gastroesophageal reflux disease) No date: Hemorrhoid     Comment:  internal No date: History of bowel resection No date: Hypertension     Comment:  EKG 12/12 EPIC   no  PCP- states increased lately but               hasnt been diagnosed formally No date: Inguinal hernia No date: Peripheral vascular disease ([place]) No date: Pneumonia     Comment:  as child, cough at present with no fever No date: Pneumonia     Comment:  as child, cough at present with no fever  11/19/11-01/08/12: Radiation     Comment:  5040 cGy 28 fx Pelvis and inguinal  area  Past Surgical History: No date: APPENDECTOMY     Comment:  age 11 or 60 10/14/2011: EXAMINATION UNDER ANESTHESIA     Comment:  Procedure: EXAM UNDER ANESTHESIA;  Surgeon: Earnstine Regal, MD;  Location: [facility];  Service: General;                Laterality: N/A;  exam under anethesia, Excision of mass               anal canal, 1.5cm 05/11/2012: INGUINAL HERNIA REPAIR     Comment:  Procedure: HERNIA REPAIR INGUINAL ADULT;  Surgeon: Earnstine Regal, MD;  Location: [facility];                Service: General;  Laterality: Left;  left inguinal               hernia repair with mesh 07/02/2021: LOWER EXTREMITY ANGIOGRAPHY; Left     Comment:  Procedure: LOWER EXTREMITY ANGIOGRAPHY;  Surgeon:               Katha Cabal, MD;  Location: [facility];               Service: Cardiovascular;  Laterality: Left; 07/16/2021: LOWER EXTREMITY ANGIOGRAPHY; Right     Comment:  Procedure:  LOWER EXTREMITY ANGIOGRAPHY;  Surgeon:               Katha Cabal, MD;  Location: [facility];               Service: Cardiovascular;  Laterality: Right; 10/14/2011: surgical pathology      Comment:  squamous cell ca of anus 10/14/2011: transanal excision     Comment:  Dr.Todd Gerkin  BMI    Body Mass Index: 20.22 kg/m      Reproductive/Obstetrics negative OB ROS                            Anesthesia Physical Anesthesia Plan  ASA: 3  Anesthesia Plan: General LMA   Post-op Pain Management:    Induction: Intravenous  PONV Risk Score and Plan: Dexamethasone, Ondansetron, Midazolam and Treatment may vary due to age or medical condition  Airway Management Planned: LMA  Additional Equipment:   Intra-op Plan:   Post-operative Plan: Extubation in OR  Informed Consent: I have reviewed the patients History and Physical, chart, labs and discussed the procedure including the risks, benefits and alternatives for the  proposed anesthesia with the patient or authorized representative who has indicated his/her understanding and acceptance.     Dental Advisory Given  Plan Discussed with: CRNA and Surgeon  Anesthesia Plan Comments:         Anesthesia Quick Evaluation

## 2021-08-27 NOTE — Op Note (Signed)
[facility]  Percutaneous Study/Intervention Procedural Note   Date of Surgery: 08/27/2021  Surgeon:  Katha Cabal, MD.  Pre-operative Diagnosis: Atherosclerotic occlusive disease bilateral lower extremities with rest pain of the left lower extremity  Post-operative diagnosis:  Same  Procedure(s) Performed:             1.  Introduction catheter into left lower extremity 3rd order catheter placement              2.    Contrast injection left lower extremity for distal runoff             3.  Percutaneous transluminal angioplasty left common femoral and superficial femoral artery to 7 mm              4.  Star close closure right common femoral arteriotomy  Anesthesia: General anesthesia.  Sheath: 6 [place] Ansell sheath right common femoral retrograde  Contrast: 60 cc  Fluoroscopy Time: 4.1 minutes  Indications:  Martin [name] presents with continued pain in his left lower extremity.  He describes pain continuously.  His ABIs are not normalized on the left as they are on the right status post intervention.  He therefore is requesting further treatment to alleviate his rest pain symptoms.  The risks and benefits of angiography and intervention are reviewed all questions answered patient agrees to proceed.  Procedure:  Martin [name] is a 60 y.o. y.o. male who was identified and appropriate procedural time out was performed.  The patient was then placed supine on the table and prepped and draped in the usual sterile fashion.    Ultrasound was placed in the sterile sleeve and the right groin was evaluated the right common femoral artery was echolucent and pulsatile indicating patency.  Image was recorded for the permanent record and under real-time visualization a microneedle was inserted into the common femoral artery microwire followed by a micro-sheath.  A J-wire was then advanced through the micro-sheath and a  5 [place] sheath was then inserted over a  J-wire. J-wire was then advanced and a 5 French pigtail catheter was positioned at the level just above the aortic bifurcation. An RAO view of the pelvis was obtained.  Subsequently a pigtail catheter with the stiff angle Glidewire was used to cross the aortic bifurcation the catheter wire were advanced down into the left distal external iliac artery. Oblique view of the femoral bifurcation was then obtained and subsequently the wire was reintroduced and the pigtail catheter negotiated into the SFA representing third order catheter placement. Distal runoff was then performed.  Diagnostic interpretation: The distal lower aorta and the aortic bifurcation is opacified with bolus injection contrast.  There are mild atherosclerotic changes but no hemodynamically significant stenoses.  Bilateral common internal and external iliac arteries are patent and free of hemodynamically significant stenosis.  The left common femoral demonstrates diffuse 60% stenosis throughout its entire length of approximately 5 cm.  In the proximal SFA there is a focal greater than 75% stenosis.  In the profunda femoris in its distal one third of the main trunk there is a focal greater than 90% stenosis extending over 5 mm.  Distal to the above-noted SFA proximal lesion there is diffuse disease but I do not identify any hemodynamically significant stenoses.  Similarly, the popliteal artery demonstrates diffuse disease without any hemodynamically significant stenosis.  The ostial lesion of the anterior tibial which was recently treated is widely patent and the anterior tibial is a  robust artery all the way down to the foot pill filling the dorsalis pedis and the pedal arch.  The tibioperoneal trunk is increasingly diseased and although the posterior tibial and peroneal are increasingly diseased and occluded by the time they reached the ankle.  5000 units of heparin was then given and allowed to circulate and a 6 [place] Ansell sheath was  advanced up and over the bifurcation and positioned in the femoral artery  Having already crossed the lesion a 6 x 40 Lutonix balloon is delivered across the proximal superficial femoral artery lesion.  Angioplasty is to 10 atm for 1 minute.  Next a 7 mm x 80 mm balloon was used to angioplasty the superficial femoral and common femoral arteries. Inflation was to 6 atmospheres for 1 minute. Follow-up imaging demonstrated patency with less than 10% residual stenosis. Distal runoff was then reassessed and found to be unchanged.  After review of these images the sheath is pulled into the right external iliac oblique of the common femoral is obtained and a Star close device deployed. There no immediate complications.   Findings:   The distal lower aorta and the aortic bifurcation is opacified with bolus injection contrast.  There are mild atherosclerotic changes but no hemodynamically significant stenoses.  Bilateral common internal and external iliac arteries are patent and free of hemodynamically significant stenosis.  The left common femoral demonstrates diffuse 60% stenosis throughout its entire length of approximately 5 cm.  In the proximal SFA there is a focal greater than 75% stenosis.  In the profunda femoris in its distal one third of the main trunk there is a focal greater than 90% stenosis extending over 5 mm.  Distal to the above-noted SFA proximal lesion there is diffuse disease but I do not identify any hemodynamically significant stenoses.  Similarly, the popliteal artery demonstrates diffuse disease without any hemodynamically significant stenosis.  The ostial lesion of the anterior tibial which was recently treated is widely patent and the anterior tibial is a robust artery all the way down to the foot pill filling the dorsalis pedis and the pedal arch.  The tibioperoneal trunk is increasingly diseased and although the posterior tibial and peroneal are increasingly diseased and occluded by the  time they reached the ankle.  Following angioplasty the common femoral and the SFA are now patent with less than 10% residual stenosis in-line flow and looks quite nice.     Summary: Successful recanalization left lower extremity for limb salvage                        Disposition: Patient was taken to the recovery room in stable condition having tolerated the procedure well.  , Dolores Lory 08/27/2021,2:22 PM

## 2021-08-27 NOTE — OR Nursing (Signed)
Md aware patient took Eliquis 2.5 mg and Plavix 75 mg today around 10:00

## 2021-08-27 NOTE — Anesthesia Postprocedure Evaluation (Signed)
Anesthesia Post Note  Patient: Martin [name]  Procedure(s) Performed: LOWER EXTREMITY ANGIOGRAPHY (Left)  Patient location during evaluation: PACU Anesthesia Type: General Level of consciousness: awake and awake and alert Pain management: pain level controlled Vital Signs Assessment: post-procedure vital signs reviewed and stable Respiratory status: spontaneous breathing and respiratory function stable Cardiovascular status: stable Anesthetic complications: no   No notable events documented.   Last Vitals:  Vitals:   08/27/21 1330 08/27/21 1345  BP: (!) 109/46 (!) 101/49  Pulse: (!) 50 (!) 51  Resp: 10 11  Temp:    SpO2: 100% 100%    Last Pain:  Vitals:   08/27/21 1345  TempSrc:   PainSc: Asleep                 VAN STAVEREN,Jozalynn Noyce

## 2021-08-29 ENCOUNTER — Encounter: Payer: Self-pay | Admitting: Vascular Surgery

## 2021-09-19 ENCOUNTER — Telehealth: Payer: Self-pay | Admitting: Family

## 2021-09-19 ENCOUNTER — Other Ambulatory Visit: Payer: Self-pay

## 2021-09-19 DIAGNOSIS — C801 Malignant (primary) neoplasm, unspecified: Secondary | ICD-10-CM

## 2021-09-19 MED ORDER — TAMSULOSIN HCL 0.4 MG PO CAPS: 0.4000 mg | ORAL_CAPSULE | Freq: Every day | ORAL | 2 refills | Status: DC

## 2021-09-19 NOTE — Telephone Encounter (Signed)
Medication request completed

## 2021-09-19 NOTE — Telephone Encounter (Signed)
tamsulosin (FLOMAX) 0.4 MG CAPS capsule [[id number]]   Pharmacy  [facility], [place], [place] [address]  Phone:  [phone]  Fax:  [phone]     PT STATES HE'S BEEN OUT 2 WEEKS

## 2021-10-10 ENCOUNTER — Encounter: Payer: Self-pay | Admitting: Family

## 2021-10-11 ENCOUNTER — Encounter (HOSPITAL_COMMUNITY): Payer: Self-pay | Admitting: Emergency Medicine

## 2021-10-11 ENCOUNTER — Inpatient Hospital Stay (HOSPITAL_COMMUNITY): Payer: Medicare Other

## 2021-10-11 ENCOUNTER — Inpatient Hospital Stay (HOSPITAL_COMMUNITY)
Admission: EM | Admit: 2021-10-11 | Discharge: 2021-10-17 | DRG: 872 | Disposition: A | Discharge status: Home Health | Payer: Medicare Other | Attending: Internal Medicine | Admitting: Internal Medicine

## 2021-10-11 ENCOUNTER — Other Ambulatory Visit: Payer: Self-pay

## 2021-10-11 ENCOUNTER — Emergency Department (HOSPITAL_COMMUNITY): Payer: Medicare Other

## 2021-10-11 DIAGNOSIS — R7881 Bacteremia: Secondary | ICD-10-CM | POA: Diagnosis not present

## 2021-10-11 DIAGNOSIS — F17210 Nicotine dependence, cigarettes, uncomplicated: Secondary | ICD-10-CM | POA: Diagnosis present

## 2021-10-11 DIAGNOSIS — A419 Sepsis, unspecified organism: Secondary | ICD-10-CM | POA: Diagnosis not present

## 2021-10-11 DIAGNOSIS — Z7901 Long term (current) use of anticoagulants: Secondary | ICD-10-CM | POA: Diagnosis not present

## 2021-10-11 DIAGNOSIS — Z9049 Acquired absence of other specified parts of digestive tract: Secondary | ICD-10-CM

## 2021-10-11 DIAGNOSIS — M170 Bilateral primary osteoarthritis of knee: Secondary | ICD-10-CM | POA: Diagnosis present

## 2021-10-11 DIAGNOSIS — E785 Hyperlipidemia, unspecified: Secondary | ICD-10-CM | POA: Diagnosis not present

## 2021-10-11 DIAGNOSIS — N179 Acute kidney failure, unspecified: Secondary | ICD-10-CM | POA: Diagnosis present

## 2021-10-11 DIAGNOSIS — Z85048 Personal history of other malignant neoplasm of rectum, rectosigmoid junction, and anus: Secondary | ICD-10-CM

## 2021-10-11 DIAGNOSIS — K056 Periodontal disease, unspecified: Secondary | ICD-10-CM | POA: Diagnosis present

## 2021-10-11 DIAGNOSIS — Z20822 Contact with and (suspected) exposure to covid-19: Secondary | ICD-10-CM | POA: Diagnosis present

## 2021-10-11 DIAGNOSIS — B9561 Methicillin susceptible Staphylococcus aureus infection as the cause of diseases classified elsewhere: Secondary | ICD-10-CM

## 2021-10-11 DIAGNOSIS — G894 Chronic pain syndrome: Secondary | ICD-10-CM | POA: Diagnosis present

## 2021-10-11 DIAGNOSIS — Z825 Family history of asthma and other chronic lower respiratory diseases: Secondary | ICD-10-CM

## 2021-10-11 DIAGNOSIS — N400 Enlarged prostate without lower urinary tract symptoms: Secondary | ICD-10-CM | POA: Diagnosis present

## 2021-10-11 DIAGNOSIS — R778 Other specified abnormalities of plasma proteins: Secondary | ICD-10-CM

## 2021-10-11 DIAGNOSIS — Z800 Family history of malignant neoplasm of digestive organs: Secondary | ICD-10-CM

## 2021-10-11 DIAGNOSIS — Z91030 Bee allergy status: Secondary | ICD-10-CM

## 2021-10-11 DIAGNOSIS — K029 Dental caries, unspecified: Secondary | ICD-10-CM | POA: Diagnosis not present

## 2021-10-11 DIAGNOSIS — F418 Other specified anxiety disorders: Secondary | ICD-10-CM | POA: Diagnosis not present

## 2021-10-11 DIAGNOSIS — K219 Gastro-esophageal reflux disease without esophagitis: Secondary | ICD-10-CM | POA: Diagnosis present

## 2021-10-11 DIAGNOSIS — I088 Other rheumatic multiple valve diseases: Secondary | ICD-10-CM | POA: Diagnosis not present

## 2021-10-11 DIAGNOSIS — F1410 Cocaine abuse, uncomplicated: Secondary | ICD-10-CM | POA: Diagnosis not present

## 2021-10-11 DIAGNOSIS — J439 Emphysema, unspecified: Secondary | ICD-10-CM | POA: Diagnosis not present

## 2021-10-11 DIAGNOSIS — D649 Anemia, unspecified: Secondary | ICD-10-CM | POA: Diagnosis present

## 2021-10-11 DIAGNOSIS — Z7902 Long term (current) use of antithrombotics/antiplatelets: Secondary | ICD-10-CM

## 2021-10-11 DIAGNOSIS — R7989 Other specified abnormal findings of blood chemistry: Secondary | ICD-10-CM

## 2021-10-11 DIAGNOSIS — Z9221 Personal history of antineoplastic chemotherapy: Secondary | ICD-10-CM

## 2021-10-11 DIAGNOSIS — F419 Anxiety disorder, unspecified: Secondary | ICD-10-CM | POA: Diagnosis present

## 2021-10-11 DIAGNOSIS — E639 Nutritional deficiency, unspecified: Secondary | ICD-10-CM | POA: Diagnosis present

## 2021-10-11 DIAGNOSIS — Z885 Allergy status to narcotic agent status: Secondary | ICD-10-CM

## 2021-10-11 DIAGNOSIS — A4101 Sepsis due to Methicillin susceptible Staphylococcus aureus: Principal | ICD-10-CM | POA: Diagnosis present

## 2021-10-11 DIAGNOSIS — B1920 Unspecified viral hepatitis C without hepatic coma: Secondary | ICD-10-CM | POA: Diagnosis not present

## 2021-10-11 DIAGNOSIS — R42 Dizziness and giddiness: Secondary | ICD-10-CM | POA: Diagnosis not present

## 2021-10-11 DIAGNOSIS — I351 Nonrheumatic aortic (valve) insufficiency: Secondary | ICD-10-CM | POA: Diagnosis not present

## 2021-10-11 DIAGNOSIS — I248 Other forms of acute ischemic heart disease: Secondary | ICD-10-CM | POA: Diagnosis not present

## 2021-10-11 DIAGNOSIS — E538 Deficiency of other specified B group vitamins: Secondary | ICD-10-CM | POA: Diagnosis present

## 2021-10-11 DIAGNOSIS — F1490 Cocaine use, unspecified, uncomplicated: Secondary | ICD-10-CM | POA: Diagnosis present

## 2021-10-11 DIAGNOSIS — Z79899 Other long term (current) drug therapy: Secondary | ICD-10-CM

## 2021-10-11 DIAGNOSIS — I340 Nonrheumatic mitral (valve) insufficiency: Secondary | ICD-10-CM | POA: Diagnosis not present

## 2021-10-11 DIAGNOSIS — I2489 Other forms of acute ischemic heart disease: Secondary | ICD-10-CM | POA: Diagnosis present

## 2021-10-11 DIAGNOSIS — F1990 Other psychoactive substance use, unspecified, uncomplicated: Secondary | ICD-10-CM | POA: Diagnosis present

## 2021-10-11 DIAGNOSIS — I739 Peripheral vascular disease, unspecified: Secondary | ICD-10-CM | POA: Diagnosis not present

## 2021-10-11 DIAGNOSIS — J449 Chronic obstructive pulmonary disease, unspecified: Secondary | ICD-10-CM | POA: Diagnosis present

## 2021-10-11 DIAGNOSIS — C210 Malignant neoplasm of anus, unspecified: Secondary | ICD-10-CM | POA: Diagnosis present

## 2021-10-11 DIAGNOSIS — Z8249 Family history of ischemic heart disease and other diseases of the circulatory system: Secondary | ICD-10-CM

## 2021-10-11 DIAGNOSIS — Z720 Tobacco use: Secondary | ICD-10-CM | POA: Diagnosis present

## 2021-10-11 DIAGNOSIS — Z886 Allergy status to analgesic agent status: Secondary | ICD-10-CM

## 2021-10-11 DIAGNOSIS — R6520 Severe sepsis without septic shock: Secondary | ICD-10-CM | POA: Diagnosis not present

## 2021-10-11 DIAGNOSIS — D638 Anemia in other chronic diseases classified elsewhere: Secondary | ICD-10-CM | POA: Diagnosis not present

## 2021-10-11 DIAGNOSIS — I10 Essential (primary) hypertension: Secondary | ICD-10-CM | POA: Diagnosis present

## 2021-10-11 DIAGNOSIS — D696 Thrombocytopenia, unspecified: Secondary | ICD-10-CM | POA: Diagnosis not present

## 2021-10-11 DIAGNOSIS — G629 Polyneuropathy, unspecified: Secondary | ICD-10-CM | POA: Diagnosis present

## 2021-10-11 DIAGNOSIS — R079 Chest pain, unspecified: Secondary | ICD-10-CM | POA: Diagnosis not present

## 2021-10-11 DIAGNOSIS — Z923 Personal history of irradiation: Secondary | ICD-10-CM

## 2021-10-11 DIAGNOSIS — E860 Dehydration: Secondary | ICD-10-CM | POA: Diagnosis present

## 2021-10-11 HISTORY — DX: Bacteremia: R78.81

## 2021-10-11 HISTORY — DX: Methicillin susceptible Staphylococcus aureus infection as the cause of diseases classified elsewhere: B95.61

## 2021-10-11 LAB — CBC
HCT: 36.8 % — ABNORMAL LOW (ref 39.0–52.0)
Hemoglobin: 12.4 g/dL — ABNORMAL LOW (ref 13.0–17.0)
MCH: 30.4 pg (ref 26.0–34.0)
MCHC: 33.7 g/dL (ref 30.0–36.0)
MCV: 90.2 fL (ref 80.0–100.0)
Platelets: 113 10*3/uL — ABNORMAL LOW (ref 150–400)
RBC: 4.08 MIL/uL — ABNORMAL LOW (ref 4.22–5.81)
RDW: 14.2 % (ref 11.5–15.5)
WBC: 19 10*3/uL — ABNORMAL HIGH (ref 4.0–10.5)
nRBC: 0 % (ref 0.0–0.2)

## 2021-10-11 LAB — COMPREHENSIVE METABOLIC PANEL
ALT: 15 U/L (ref 0–44)
AST: 26 U/L (ref 15–41)
Albumin: 3.2 g/dL — ABNORMAL LOW (ref 3.5–5.0)
Alkaline Phosphatase: 84 U/L (ref 38–126)
Anion gap: 13 (ref 5–15)
BUN: 19 mg/dL (ref 8–23)
CO2: 21 mmol/L — ABNORMAL LOW (ref 22–32)
Calcium: 9.1 mg/dL (ref 8.9–10.3)
Chloride: 99 mmol/L (ref 98–111)
Creatinine, Ser: 1.29 mg/dL — ABNORMAL HIGH (ref 0.61–1.24)
GFR, Estimated: >60 mL/min (ref >60)
Glucose, Bld: 97 mg/dL (ref 70–99)
Potassium: 4.3 mmol/L (ref 3.5–5.1)
Sodium: 133 mmol/L — ABNORMAL LOW (ref 135–145)
Total Bilirubin: 0.9 mg/dL (ref 0.3–1.2)
Total Protein: 6.7 g/dL (ref 6.5–8.1)

## 2021-10-11 LAB — CBG MONITORING, ED: Glucose-Capillary: 100 mg/dL — ABNORMAL HIGH (ref 70–99)

## 2021-10-11 LAB — LIPASE, BLOOD: Lipase: 21 U/L (ref 11–51)

## 2021-10-11 LAB — C-REACTIVE PROTEIN: CRP: 20.2 mg/dL — ABNORMAL HIGH (ref <1.0)

## 2021-10-11 LAB — SEDIMENTATION RATE: Sed Rate: 58 mm/hr — ABNORMAL HIGH (ref 0–16)

## 2021-10-11 LAB — TROPONIN I (HIGH SENSITIVITY)
Troponin I (High Sensitivity): 241 ng/L (ref <18)
Troponin I (High Sensitivity): 240 ng/L (ref <18)

## 2021-10-11 LAB — LACTIC ACID, PLASMA
Lactic Acid, Venous: 2.9 mmol/L (ref 0.5–1.9)
Lactic Acid, Venous: 2.4 mmol/L (ref 0.5–1.9)
Lactic Acid, Venous: 3.2 mmol/L (ref 0.5–1.9)

## 2021-10-11 LAB — RESP PANEL BY RT-PCR (FLU A&B, COVID) ARPGX2
Influenza A by PCR: NEGATIVE
Influenza B by PCR: NEGATIVE
SARS Coronavirus 2 by RT PCR: NEGATIVE

## 2021-10-11 LAB — ETHANOL: Alcohol, Ethyl (B): <10 mg/dL (ref <10)

## 2021-10-11 LAB — HIV ANTIBODY (ROUTINE TESTING W REFLEX): HIV Screen 4th Generation wRfx: NONREACTIVE

## 2021-10-11 MED ORDER — MORPHINE SULFATE (PF) 4 MG/ML IV SOLN
4.0000 mg | Freq: Once | INTRAVENOUS | Status: AC
  Administered 2021-10-11: 4 mg via INTRAVENOUS
  Filled 2021-10-11: qty 1

## 2021-10-11 MED ORDER — DEXTROSE IN LACTATED RINGERS 5 % IV SOLN: INTRAVENOUS | Status: DC

## 2021-10-11 MED ORDER — ALBUTEROL SULFATE (2.5 MG/3ML) 0.083% IN NEBU: 3.0000 mL | INHALATION_SOLUTION | Freq: Four times a day (QID) | RESPIRATORY_TRACT | Status: DC | PRN

## 2021-10-11 MED ORDER — ONDANSETRON HCL 4 MG PO TABS
4.0000 mg | ORAL_TABLET | Freq: Four times a day (QID) | ORAL | Status: DC | PRN
  Administered 2021-10-13: 03:00:00 4 mg via ORAL
  Filled 2021-10-11: qty 1

## 2021-10-11 MED ORDER — SODIUM CHLORIDE 0.9 % IV BOLUS
1000.0000 mL | Freq: Once | INTRAVENOUS | Status: AC
  Administered 2021-10-11: 1000 mL via INTRAVENOUS

## 2021-10-11 MED ORDER — NICOTINE 21 MG/24HR TD PT24
21.0000 mg | MEDICATED_PATCH | Freq: Every day | TRANSDERMAL | Status: DC
  Administered 2021-10-11 – 2021-10-17 (×7): 21 mg via TRANSDERMAL
  Filled 2021-10-11 (×7): qty 1

## 2021-10-11 MED ORDER — ONDANSETRON HCL 4 MG/2ML IJ SOLN
4.0000 mg | Freq: Four times a day (QID) | INTRAMUSCULAR | Status: DC | PRN
  Administered 2021-10-11: 21:00:00 4 mg via INTRAVENOUS
  Filled 2021-10-11: qty 2

## 2021-10-11 MED ORDER — CLOPIDOGREL BISULFATE 75 MG PO TABS
75.0000 mg | ORAL_TABLET | Freq: Every day | ORAL | Status: DC
  Administered 2021-10-11 – 2021-10-17 (×7): 75 mg via ORAL
  Filled 2021-10-11 (×7): qty 1

## 2021-10-11 MED ORDER — HYDRALAZINE HCL 25 MG PO TABS: 25.0000 mg | ORAL_TABLET | Freq: Four times a day (QID) | ORAL | Status: DC | PRN

## 2021-10-11 MED ORDER — VANCOMYCIN HCL 1250 MG/250ML IV SOLN: 1250.0000 mg | INTRAVENOUS | Status: DC

## 2021-10-11 MED ORDER — TRAMADOL HCL 50 MG PO TABS
50.0000 mg | ORAL_TABLET | Freq: Four times a day (QID) | ORAL | Status: DC | PRN
  Administered 2021-10-12 – 2021-10-17 (×5): 50 mg via ORAL
  Filled 2021-10-11 (×5): qty 1

## 2021-10-11 MED ORDER — TAMSULOSIN HCL 0.4 MG PO CAPS
0.4000 mg | ORAL_CAPSULE | Freq: Every day | ORAL | Status: DC
  Administered 2021-10-11 – 2021-10-16 (×6): 0.4 mg via ORAL
  Filled 2021-10-11 (×6): qty 1

## 2021-10-11 MED ORDER — VANCOMYCIN HCL 1250 MG/250ML IV SOLN
1250.0000 mg | Freq: Once | INTRAVENOUS | Status: AC
  Administered 2021-10-11: 1250 mg via INTRAVENOUS
  Filled 2021-10-11: qty 250

## 2021-10-11 MED ORDER — ACETAMINOPHEN 650 MG RE SUPP: 650.0000 mg | Freq: Four times a day (QID) | RECTAL | Status: DC | PRN

## 2021-10-11 MED ORDER — SODIUM CHLORIDE 0.9 % IV SOLN
2.0000 g | Freq: Once | INTRAVENOUS | Status: AC
  Administered 2021-10-11: 2 g via INTRAVENOUS
  Filled 2021-10-11: qty 2

## 2021-10-11 MED ORDER — FOLIC ACID 1 MG PO TABS
1.0000 mg | ORAL_TABLET | Freq: Every day | ORAL | Status: DC
  Administered 2021-10-11 – 2021-10-17 (×7): 1 mg via ORAL
  Filled 2021-10-11 (×7): qty 1

## 2021-10-11 MED ORDER — ADULT MULTIVITAMIN W/MINERALS CH
1.0000 | ORAL_TABLET | Freq: Every day | ORAL | Status: DC
  Administered 2021-10-11 – 2021-10-17 (×7): 1 via ORAL
  Filled 2021-10-11 (×7): qty 1

## 2021-10-11 MED ORDER — SODIUM CHLORIDE 0.9 % IV BOLUS: 800.0000 mL | Freq: Once | INTRAVENOUS | Status: DC

## 2021-10-11 MED ORDER — IBUPROFEN 400 MG PO TABS
600.0000 mg | ORAL_TABLET | Freq: Once | ORAL | Status: DC
  Filled 2021-10-11: qty 1

## 2021-10-11 MED ORDER — SODIUM CHLORIDE 0.9% FLUSH
3.0000 mL | Freq: Two times a day (BID) | INTRAVENOUS | Status: DC
  Administered 2021-10-12 – 2021-10-17 (×9): 3 mL via INTRAVENOUS

## 2021-10-11 MED ORDER — APIXABAN 2.5 MG PO TABS
2.5000 mg | ORAL_TABLET | Freq: Two times a day (BID) | ORAL | Status: DC
  Administered 2021-10-11 – 2021-10-17 (×12): 2.5 mg via ORAL
  Filled 2021-10-11 (×12): qty 1

## 2021-10-11 MED ORDER — ACETAMINOPHEN 325 MG PO TABS
650.0000 mg | ORAL_TABLET | Freq: Once | ORAL | Status: AC | PRN
  Administered 2021-10-11: 650 mg via ORAL
  Filled 2021-10-11: qty 2

## 2021-10-11 MED ORDER — THIAMINE HCL 100 MG PO TABS
100.0000 mg | ORAL_TABLET | Freq: Every day | ORAL | Status: DC
  Administered 2021-10-11 – 2021-10-17 (×7): 100 mg via ORAL
  Filled 2021-10-11 (×7): qty 1

## 2021-10-11 MED ORDER — ACETAMINOPHEN 325 MG PO TABS
650.0000 mg | ORAL_TABLET | Freq: Four times a day (QID) | ORAL | Status: DC | PRN
  Administered 2021-10-13: 02:00:00 650 mg via ORAL
  Filled 2021-10-11 (×2): qty 2

## 2021-10-11 NOTE — ED Notes (Signed)
ED TO INPATIENT HANDOFF REPORT  ED Nurse Name and Phone #: Laqueta Jean [phone]  S Name/Age/Gender Martin [name] 61 y.o. male Room/Bed: 029C/029C  Code Status   Code Status: Prior  Home/SNF/Other Home Patient oriented to: self, place, time, and situation Is this baseline? Yes   Triage Complete: Triage complete  Chief Complaint Sepsis [facility]) [A41.9]  Triage Note Patient BIB [facility] from home with complaint of dizziness, headache, and emesis that started last night. One episode of vomiting with EMS. Patient states dizziness is worse when standing. No focal weaknesses with EMS. Patient is alert, oriented, and in no apparent distress. Patient is on methadone, has not had methadone in the last few days. Patient does reports injecting cocaine yesterday but states that is a regular practice and he does not believe that is related to his symptoms.   Allergies Allergies  Allergen Reactions   Yellow Jacket Venom [Bee Venom] Anaphylaxis   Aspirin Swelling   Codeine Nausea Only   Ibuprofen     On blood thinners   Morphine And Related Itching    Level of Care/Admitting Diagnosis ED Disposition     ED Disposition  Admit   Condition  --   Comment  Hospital Area: [facility] [[id number]]  Level of Care: Telemetry Medical [[id number]]  May admit patient to [facility] or [facility] if equivalent level of care is available:: No  Covid Evaluation: Confirmed COVID Negative  Diagnosis: Sepsis [facility]) [[id number]]  Admitting Physician: Patrecia Pour [[id number]]  Attending Physician: Patrecia Pour [phone]  Estimated length of stay: past midnight tomorrow  Certification:: I certify this patient will need inpatient services for at least 2 midnights          B Medical/Surgery History Past Medical History:  Diagnosis Date   Allergy 10/2011   Anal cancer ([place]) 10/14/2011   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    Anxiety    Arthritis    DDD lumbar, arthritis knees   DJD  (degenerative joint disease) of lumbar spine    GERD (gastroesophageal reflux disease)    Hemorrhoid    internal   History of bowel resection    Hypertension    EKG 12/12 EPIC   no  PCP- states increased lately but hasnt been diagnosed formally   Inguinal hernia    Peripheral vascular disease ([place])    Pneumonia    as child, cough at present with no fever   Pneumonia    as child, cough at present with no fever    Radiation 11/19/11-01/08/12   5040 cGy 28 fx Pelvis and inguinal area   Past Surgical History:  Procedure Laterality Date   APPENDECTOMY     age 34 or 29   EXAMINATION UNDER ANESTHESIA  10/14/2011   Procedure: EXAM UNDER ANESTHESIA;  Surgeon: Earnstine Regal, MD;  Location: [facility];  Service: General;  Laterality: N/A;  exam under anethesia, Excision of mass anal canal, 1.5cm   INGUINAL HERNIA REPAIR  05/11/2012   Procedure: HERNIA REPAIR INGUINAL ADULT;  Surgeon: Earnstine Regal, MD;  Location: [facility];  Service: General;  Laterality: Left;  left inguinal hernia repair with mesh   LOWER EXTREMITY ANGIOGRAPHY Left 07/02/2021   Procedure: LOWER EXTREMITY ANGIOGRAPHY;  Surgeon: Katha Cabal, MD;  Location: [place] [facility];  Service: Cardiovascular;  Laterality: Left;   LOWER EXTREMITY ANGIOGRAPHY Right 07/16/2021   Procedure: LOWER EXTREMITY ANGIOGRAPHY;  Surgeon: Katha Cabal, MD;  Location: [place] [facility];  Service: Cardiovascular;  Laterality: Right;   LOWER EXTREMITY ANGIOGRAPHY Left 08/27/2021   Procedure: LOWER EXTREMITY ANGIOGRAPHY;  Surgeon: Katha Cabal, MD;  Location: [place] [facility];  Service: Cardiovascular;  Laterality: Left;   surgical pathology   10/14/2011   squamous cell ca of anus   transanal excision  10/14/2011   Dr.Todd Gerkin     A IV Location/Drains/Wounds Patient Lines/Drains/Airways Status     Active Line/Drains/Airways     Name Placement date Placement time Site Days   Peripheral IV 10/11/21 20 G Left Hand  10/11/21  1530  Hand  less than 1   Peripheral IV 10/11/21 20 G Left Forearm 10/11/21  1535  Forearm  less than 1            Intake/Output Last 24 hours  Intake/Output Summary (Last 24 hours) at 10/11/2021 1742 Last data filed at 10/11/2021 1650 Gross per 24 hour  Intake 1000 ml  Output --  Net 1000 ml    Labs/Imaging Results for orders placed or performed during the hospital encounter of 10/11/21 (from the past 48 hour(s))  CBC     Status: Abnormal   Collection Time: 10/11/21  1:45 PM  Result Value Ref Range   WBC 19.0 (H) 4.0 - 10.5 K/uL   RBC 4.08 (L) 4.22 - 5.81 MIL/uL   Hemoglobin 12.4 (L) 13.0 - 17.0 g/dL   HCT 36.8 (L) 39.0 - 52.0 %   MCV 90.2 80.0 - 100.0 fL   MCH 30.4 26.0 - 34.0 pg   MCHC 33.7 30.0 - 36.0 g/dL   RDW 14.2 11.5 - 15.5 %   Platelets 113 (L) 150 - 400 K/uL    Comment: Immature Platelet Fraction may be clinically indicated, consider ordering this additional test [id number] REPEATED TO VERIFY PLATELET COUNT CONFIRMED BY SMEAR    nRBC 0.0 0.0 - 0.2 %    Comment: Performed at [facility], [place] [address], [place], [place] [address]  Troponin I (High Sensitivity)     Status: Abnormal   Collection Time: 10/11/21  1:45 PM  Result Value Ref Range   Troponin I (High Sensitivity) 241 (HH) <18 ng/L    Comment: CRITICAL RESULT CALLED TO, READ BACK BY AND VERIFIED WITH: BRITTANY BECK,RN AT 0962 10/11/2021 BY ZBEECH. (NOTE) Elevated high sensitivity troponin I (hsTnI) values and significant  changes across serial measurements may suggest ACS but many other  chronic and acute conditions are known to elevate hsTnI results.  Refer to the Links section for chest pain algorithms and additional  guidance. Performed at [facility], [place] [address] [place], [place] [address]   Lipase, blood     Status: None   Collection Time: 10/11/21  1:45 PM  Result Value Ref Range   Lipase 21 11 - 51 U/L    Comment: Performed at [facility]  [address] [place], [place] [address]  Comprehensive metabolic panel     Status: Abnormal   Collection Time: 10/11/21  1:45 PM  Result Value Ref Range   Sodium 133 (L) 135 - 145 mmol/L   Potassium 4.3 3.5 - 5.1 mmol/L   Chloride 99 98 - 111 mmol/L   CO2 21 (L) 22 - 32 mmol/L   Glucose, Bld 97 70 - 99 mg/dL    Comment: Glucose reference range applies only to samples taken after fasting for at least 8 hours.   BUN 19 8 - 23  mg/dL   Creatinine, Ser 1.29 (H) 0.61 - 1.24 mg/dL   Calcium 9.1 8.9 - 10.3 mg/dL   Total Protein 6.7 6.5 - 8.1 g/dL   Albumin 3.2 (L) 3.5 - 5.0 g/dL   AST 26 15 - 41 U/L   ALT 15 0 - 44 U/L   Alkaline Phosphatase 84 38 - 126 U/L   Total Bilirubin 0.9 0.3 - 1.2 mg/dL   GFR, Estimated >60 >60 mL/min    Comment: (NOTE) Calculated using the CKD-EPI Creatinine Equation (2021)    Anion gap 13 5 - 15    Comment: Performed at [facility] [address], [place], [place] [address]  Ethanol     Status: None   Collection Time: 10/11/21  2:11 PM  Result Value Ref Range   Alcohol, Ethyl (B) <10 <10 mg/dL    Comment: (NOTE) Lowest detectable limit for serum alcohol is 10 mg/dL.  For medical purposes only. Performed at [facility], [place] [address], [place], [place] [address]   Resp Panel by RT-PCR (Flu A&B, Covid)     Status: None   Collection Time: 10/11/21  3:23 PM   Specimen: Nasopharyngeal(NP) swabs in vial transport medium  Result Value Ref Range   SARS Coronavirus 2 by RT PCR NEGATIVE NEGATIVE    Comment: (NOTE) SARS-CoV-2 target nucleic acids are NOT DETECTED.  The SARS-CoV-2 RNA is generally detectable in upper respiratory specimens during the acute phase of infection. The lowest concentration of SARS-CoV-2 viral copies this assay can detect is 138 copies/mL. A negative result does not preclude SARS-Cov-2 infection and should not be used as the sole basis for treatment or other patient management decisions. A negative result may occur with  improper  specimen collection/handling, submission of specimen other than nasopharyngeal swab, presence of viral mutation(s) within the areas targeted by this assay, and inadequate number of viral copies(<138 copies/mL). A negative result must be combined with clinical observations, patient history, and epidemiological information. The expected result is Negative.  Fact Sheet for Patients:  EntrepreneurPulse.com.au  Fact Sheet for Healthcare Providers:  IncredibleEmployment.be  This test is no t yet approved or cleared by the [place] FDA and  has been authorized for detection and/or diagnosis of SARS-CoV-2 by FDA under an Emergency Use Authorization (EUA). This EUA will remain  in effect (meaning this test can be used) for the duration of the COVID-19 declaration under Section 564(b)(1) of the Act, 21 U.S.C.section 360bbb-3(b)(1), unless the authorization is terminated  or revoked sooner.       Influenza A by PCR NEGATIVE NEGATIVE   Influenza B by PCR NEGATIVE NEGATIVE    Comment: (NOTE) The Xpert Xpress SARS-CoV-2/FLU/RSV plus assay is intended as an aid in the diagnosis of influenza from Nasopharyngeal swab specimens and should not be used as a sole basis for treatment. Nasal washings and aspirates are unacceptable for Xpert Xpress SARS-CoV-2/FLU/RSV testing.  Fact Sheet for Patients: EntrepreneurPulse.com.au  Fact Sheet for Healthcare Providers: IncredibleEmployment.be  This test is not yet approved or cleared by the [place] FDA and has been authorized for detection and/or diagnosis of SARS-CoV-2 by FDA under an Emergency Use Authorization (EUA). This EUA will remain in effect (meaning this test can be used) for the duration of the COVID-19 declaration under Section 564(b)(1) of the Act, 21 U.S.C. section 360bbb-3(b)(1), unless the authorization is terminated or revoked.  Performed at [facility], [place] [address] [place], [place] [address]   Sedimentation rate  Status: Abnormal   Collection Time: 10/11/21  3:30 PM  Result Value Ref Range   Sed Rate 58 (H) 0 - 16 mm/hr    Comment: Performed at [facility] [address], [place], [place] [address]  C-reactive protein     Status: Abnormal   Collection Time: 10/11/21  3:30 PM  Result Value Ref Range   CRP 20.2 (H) <1.0 mg/dL    Comment: Performed at [facility] [address] [place], [place] [address]  Troponin I (High Sensitivity)     Status: Abnormal   Collection Time: 10/11/21  3:39 PM  Result Value Ref Range   Troponin I (High Sensitivity) 240 (HH) <18 ng/L    Comment: CRITICAL VALUE NOTED.  VALUE IS CONSISTENT WITH PREVIOUSLY REPORTED AND CALLED VALUE. (NOTE) Elevated high sensitivity troponin I (hsTnI) values and significant  changes across serial measurements may suggest ACS but many other  chronic and acute conditions are known to elevate hsTnI results.  Refer to the Links section for chest pain algorithms and additional  guidance. Performed at [facility], [place] [address], [place], [place] [address]   CBG monitoring, ED     Status: Abnormal   Collection Time: 10/11/21  3:44 PM  Result Value Ref Range   Glucose-Capillary 100 (H) 70 - 99 mg/dL    Comment: Glucose reference range applies only to samples taken after fasting for at least 8 hours.  Lactic acid, plasma     Status: Abnormal   Collection Time: 10/11/21  3:44 PM  Result Value Ref Range   Lactic Acid, Venous 2.9 (HH) 0.5 - 1.9 mmol/L    Comment: CRITICAL RESULT CALLED TO, READ BACK BY AND VERIFIED WITH: G.CATES,RN 10/10/2021 AT 1642 A.HUGHES Performed at [facility], [place] [address], [place], [place] [address]    DG Chest 2 View  Result Date: 10/11/2021 CLINICAL DATA:  Chest pain EXAM: CHEST - 2 VIEW COMPARISON:  Chest x-ray 02/09/2021 FINDINGS: Heart size and mediastinal contours are within normal limits.  Hyperinflated lungs. No suspicious pulmonary opacities identified. No pleural effusion or pneumothorax visualized. No acute osseous abnormality appreciated. IMPRESSION: Emphysematous changes with no acute process identified. Electronically Signed   By: Ofilia Neas M.D.   On: 10/11/2021 14:12    Pending Labs Unresulted Labs (From admission, onward)     Start     Ordered   10/11/21 1545  Urine Culture  Once,   STAT       Question:  Indication  Answer:  Sepsis   10/11/21 1544   10/11/21 1544  Lactic acid, plasma  Now then every 2 hours,   STAT      10/11/21 1543   10/11/21 1521  Culture, blood (routine x 2)  BLOOD CULTURE X 2,   STAT      10/11/21 1520   10/11/21 1358  Urine rapid drug screen (hosp performed)  ONCE - STAT,   STAT        10/11/21 1357   10/11/21 1338  Urinalysis, Routine w reflex microscopic  Once,   STAT        10/11/21 1338            Vitals/Pain Today's Vitals   10/11/21 1630 10/11/21 1645 10/11/21 1646 10/11/21 1715  BP: (!) 106/59 (!) 114/59  138/60  Pulse: 61 75  63  Resp: 19 17  18   Temp:   98 F (36.7 C)   TempSrc:   Oral  SpO2: 100% 100%  100%  Weight:      Height:      PainSc:        Isolation Precautions No active isolations  Medications Medications  vancomycin (VANCOREADY) IVPB 1250 mg/250 mL (1,250 mg Intravenous New Bag/Given 10/11/21 1736)  acetaminophen (TYLENOL) tablet 650 mg (650 mg Oral Given 10/11/21 1347)  sodium chloride 0.9 % bolus 1,000 mL (0 mLs Intravenous Stopped 10/11/21 1650)  morphine (PF) 4 MG/ML injection 4 mg (4 mg Intravenous Given 10/11/21 1549)  ceFEPIme (MAXIPIME) 2 g in sodium chloride 0.9 % 100 mL IVPB (2 g Intravenous New Bag/Given 10/11/21 1644)    Mobility walks Low fall risk   Focused Assessments Neuro Assessment Handoff:        Neuro Assessment: Within Defined Limits   R Recommendations: See Admitting Provider Note

## 2021-10-11 NOTE — ED Notes (Signed)
MD notified of critcal lactic of 2.9

## 2021-10-11 NOTE — ED Provider Triage Note (Signed)
Emergency Medicine Provider Triage Evaluation Note  Martin [name] , a 61 y.o. male  was evaluated in triage.  Pt complains of dizziness and emesis onset last night.  He notes that he used cocaine yesterday and used his normal amount.  Denies cocaine use today or alcohol use.  Has associated nausea.  Denies chest pain, shortness of breath.  Review of Systems  Positive: As per HPI above Negative: Fever, chills  Physical Exam  BP (!) 113/59 (BP Location: Right Arm)    Pulse 84    Temp (!) 100.9 F (38.3 C) (Oral)    Resp 16    SpO2 99%  Gen:   Somnolent during exam, but arousable, no distress Resp:  Normal effort MSK:   Moves extremities without difficulty  Other:  Pinpoint pupils noted. No chest wall TTP  Medical Decision Making  Medically screening exam initiated at 1:52 PM.  Appropriate orders placed.  TUFF CLABO was informed that the remainder of the evaluation will be completed by another provider, this initial triage assessment does not replace that evaluation, and the importance of remaining in the ED until their evaluation is complete.  1:57 PM - Discussed with RN that patient is in need of a room immediately. RN aware and working on room placement.    Lucion Dilger A, PA-C 10/11/21 1521

## 2021-10-11 NOTE — Plan of Care (Signed)
°  Problem: Respiratory: °Goal: Ability to maintain adequate ventilation will improve °Outcome: Progressing °  °

## 2021-10-11 NOTE — ED Notes (Signed)
Pt began to complain of numbness and a pins and needles sensation in both his hands and arms. Pt stated he has felt this before but it normally goes away quickly and this time it has not. MD notified.

## 2021-10-11 NOTE — ED Triage Notes (Signed)
Patient BIB [facility] from home with complaint of dizziness, headache, and emesis that started last night. One episode of vomiting with EMS. Patient states dizziness is worse when standing. No focal weaknesses with EMS. Patient is alert, oriented, and in no apparent distress. Patient is on methadone, has not had methadone in the last few days. Patient does reports injecting cocaine yesterday but states that is a regular practice and he does not believe that is related to his symptoms.

## 2021-10-11 NOTE — ED Provider Notes (Addendum)
[facility] EMERGENCY DEPARTMENT Provider Note   CSN: [id number] Arrival date & time: 10/11/21  1330     History  Chief Complaint  Patient presents with   Dizziness    Martin [name] is a 61 y.o. male.  Patient with history of IV drug use, hypertension, presents ER chief complaint of dizziness generalized malaise body aches, headache.  He denies chest pain.  He states has been vomiting since last night as well.  Nonbloody nonbilious vomitus.      Home Medications Prior to Admission medications   Medication Sig Start Date End Date Taking? Authorizing Provider  acetaminophen (TYLENOL) 325 MG tablet Take 650 mg by mouth every 6 (six) hours as needed for moderate pain.   Yes [provider]  albuterol (VENTOLIN HFA) 108 (90 Base) MCG/ACT inhaler Inhale 2 puffs into the lungs every 6 (six) hours as needed for wheezing or shortness of breath. 06/26/21  Yes Minette Brine, Amy J, NP  apixaban (ELIQUIS) 2.5 MG TABS tablet Take 1 tablet (2.5 mg total) by mouth 2 (two) times daily. 07/02/21  Yes Schnier, Dolores Lory, MD  clopidogrel (PLAVIX) 75 MG tablet Take 1 tablet (75 mg total) by mouth daily. 07/03/21  Yes Schnier, Dolores Lory, MD  tamsulosin (FLOMAX) 0.4 MG CAPS capsule Take 1 capsule (0.4 mg total) by mouth at bedtime. 09/19/21  Yes Minette Brine, Amy J, NP  amoxicillin-clavulanate (AUGMENTIN) 875-125 MG tablet Take 1 tablet by mouth 2 (two) times daily. Patient not taking: Reported on 08/05/2021 07/03/21   Camillia Herter, NP  atorvastatin (LIPITOR) 40 MG tablet Take 1 tablet (40 mg total) by mouth daily. Patient not taking: Reported on 10/11/2021 06/14/21 10/12/21  Camillia Herter, NP  dextromethorphan-guaiFENesin [facility] DM) 30-600 MG 12hr tablet Take 1 tablet by mouth 2 (two) times daily. Patient not taking: Reported on 06/28/2021 02/25/21   Wieters, Madelynn Done C, PA-C  EPINEPHrine (EPIPEN 2-PAK) 0.3 mg/0.3 mL IJ SOAJ injection Inject 0.3 mLs (0.3 mg total) into the muscle as  needed for anaphylaxis. Patient not taking: Reported on 10/11/2021 02/23/20   Long, Wonda Olds, MD  HYDROcodone bit-homatropine (HYCODAN) 5-1.5 MG/5ML syrup Take 5 mLs by mouth at bedtime as needed for cough. Patient not taking: Reported on 06/28/2021 02/25/21   Wieters, Hallie C, PA-C  nicotine (NICODERM CQ) 14 mg/24hr patch Place 1 patch (14 mg total) onto the skin daily. Patient not taking: Reported on 06/28/2021 06/14/21   Camillia Herter, NP  Phenylephrine-Acetaminophen 5-325 MG CAPS Take 2 tablets by mouth every 6 (six) hours as needed (cold symptoms). Patient not taking: Reported on 07/03/2021    [provider]  predniSONE (DELTASONE) 10 MG tablet Take 6 tabs day one, 5 tabs day two, 4 tabs day three, etc Patient not taking: Reported on 06/28/2021 04/10/21   Volney American, PA-C  umeclidinium-vilanterol [facility] ELLIPTA) 62.5-25 MCG/INH AEPB Inhale 1 puff into the lungs daily. Patient not taking: Reported on 10/11/2021 04/10/21   Volney American, PA-C  sucralfate (CARAFATE) 1 GM/10ML suspension Take 10 mLs (1 g total) by mouth 4 (four) times daily -  with meals and at bedtime. Patient not taking: Reported on 11/28/2019 10/21/19 06/13/20  Palumbo, April, MD      Allergies    Yellow jacket venom [bee venom], Aspirin, Codeine, Ibuprofen, and Morphine and related    Review of Systems   Review of Systems  Constitutional:  Negative for fever.  HENT:  Negative for ear pain and sore throat.  Eyes:  Negative for pain.  Respiratory:  Negative for cough.   Cardiovascular:  Negative for chest pain.  Gastrointestinal:  Positive for abdominal pain.  Genitourinary:  Negative for flank pain.  Musculoskeletal:  Negative for back pain.  Skin:  Negative for color change and rash.  Neurological:  Positive for headaches. Negative for syncope.  All other systems reviewed and are negative.  Physical Exam Updated Vital Signs BP 138/60    Pulse 63    Temp 98 F (36.7 C) (Oral)    Resp 18     Ht 5' 11"  (1.803 m)    Wt 67.1 kg    SpO2 100%    BMI 20.64 kg/m  Physical Exam Constitutional:      Appearance: He is well-developed.  HENT:     Head: Normocephalic.     Nose: Nose normal.  Eyes:     Extraocular Movements: Extraocular movements intact.     Comments: No lesions seen in the conjunctiva.  Neck:     Comments: No meningismal signs noted. Cardiovascular:     Rate and Rhythm: Normal rate.     Heart sounds: Normal heart sounds. No murmur heard.   No gallop.  Pulmonary:     Effort: Pulmonary effort is normal.  Abdominal:     General: There is no distension.     Tenderness: There is no abdominal tenderness. There is no guarding or rebound.  Musculoskeletal:     Cervical back: Normal range of motion and neck supple. No rigidity.  Skin:    Coloration: Skin is not jaundiced.     Comments: No splinter hemorrhages seen on fingernail exam.  Neurological:     Mental Status: He is alert. Mental status is at baseline.    ED Results / Procedures / Treatments   Labs (all labs ordered are listed, but only abnormal results are displayed) Labs Reviewed  CBC - Abnormal; Notable for the following components:      Result Value   WBC 19.0 (*)    RBC 4.08 (*)    Hemoglobin 12.4 (*)    HCT 36.8 (*)    Platelets 113 (*)    All other components within normal limits  COMPREHENSIVE METABOLIC PANEL - Abnormal; Notable for the following components:   Sodium 133 (*)    CO2 21 (*)    Creatinine, Ser 1.29 (*)    Albumin 3.2 (*)    All other components within normal limits  SEDIMENTATION RATE - Abnormal; Notable for the following components:   Sed Rate 58 (*)    All other components within normal limits  C-REACTIVE PROTEIN - Abnormal; Notable for the following components:   CRP 20.2 (*)    All other components within normal limits  LACTIC ACID, PLASMA - Abnormal; Notable for the following components:   Lactic Acid, Venous 2.9 (*)    All other components within normal limits  CBG  MONITORING, ED - Abnormal; Notable for the following components:   Glucose-Capillary 100 (*)    All other components within normal limits  TROPONIN I (HIGH SENSITIVITY) - Abnormal; Notable for the following components:   Troponin I (High Sensitivity) 241 (*)    All other components within normal limits  TROPONIN I (HIGH SENSITIVITY) - Abnormal; Notable for the following components:   Troponin I (High Sensitivity) 240 (*)    All other components within normal limits  RESP PANEL BY RT-PCR (FLU A&B, COVID) ARPGX2  CULTURE, BLOOD (ROUTINE X 2)  CULTURE, BLOOD (  ROUTINE X 2)  URINE CULTURE  LIPASE, BLOOD  ETHANOL  URINALYSIS, ROUTINE W REFLEX MICROSCOPIC  RAPID URINE DRUG SCREEN, HOSP PERFORMED  LACTIC ACID, PLASMA    EKG EKG Interpretation  Date/Time:  Friday October 11 2021 13:27:15 EST Ventricular Rate:  80 PR Interval:  132 QRS Duration: 86 QT Interval:  374 QTC Calculation: 431 R Axis:   86 Text Interpretation: Sinus rhythm with occasional Premature ventricular complexes Possible Left atrial enlargement Nonspecific T wave abnormality Abnormal ECG When compared with ECG of 08-Mar-2020 07:31, PREVIOUS ECG IS PRESENT No significant change since last tracing Confirmed by Deno Etienne [phone]) on 10/11/2021 2:58:21 PM  Radiology DG Chest 2 View  Result Date: 10/11/2021 CLINICAL DATA:  Chest pain EXAM: CHEST - 2 VIEW COMPARISON:  Chest x-ray 02/09/2021 FINDINGS: Heart size and mediastinal contours are within normal limits. Hyperinflated lungs. No suspicious pulmonary opacities identified. No pleural effusion or pneumothorax visualized. No acute osseous abnormality appreciated. IMPRESSION: Emphysematous changes with no acute process identified. Electronically Signed   By: Ofilia Neas M.D.   On: 10/11/2021 14:12    Procedures .Critical Care Performed by: Luna Fuse, MD Authorized by: Luna Fuse, MD   Critical care provider statement:    Critical care time (minutes):  30    Critical care was necessary to treat or prevent imminent or life-threatening deterioration of the following conditions:  CNS failure or compromise and sepsis   Critical care was time spent personally by me on the following activities:  Development of treatment plan with patient or surrogate, discussions with consultants, evaluation of patient's response to treatment, examination of patient, ordering and review of laboratory studies, ordering and review of radiographic studies, ordering and performing treatments and interventions, pulse oximetry, re-evaluation of patient's condition and review of old charts Comments:     Sepsis, elevated troponin elevated lactic acid    Medications Ordered in ED Medications  vancomycin (VANCOREADY) IVPB 1250 mg/250 mL (1,250 mg Intravenous New Bag/Given 10/11/21 1736)  acetaminophen (TYLENOL) tablet 650 mg (650 mg Oral Given 10/11/21 1347)  sodium chloride 0.9 % bolus 1,000 mL (0 mLs Intravenous Stopped 10/11/21 1650)  morphine (PF) 4 MG/ML injection 4 mg (4 mg Intravenous Given 10/11/21 1549)  ceFEPIme (MAXIPIME) 2 g in sodium chloride 0.9 % 100 mL IVPB (2 g Intravenous New Bag/Given 10/11/21 1644)    ED Course/ Medical Decision Making/ A&P                           Medical Decision Making Amount and/or Complexity of Data Reviewed Labs: ordered. Radiology: ordered.  Risk OTC drugs. Prescription drug management. Decision regarding hospitalization.   Review of outside charts show patient was admitted August 27, 2021 for peripheral vascular disease and was placed on blood thinners.  Today patient is febrile and tachycardic.  Blood pressure appears stable.  Labs returned elevated white count.  Lactic acid sent and blood culture sent at this time.  Given history of IV drug use concern for bacteremia versus endocarditis versus other causes of sepsis.  Will be started on broad-spectrum antibiotics after cultures have been sent.          Final Clinical  Impression(s) / ED Diagnoses Final diagnoses:  Sepsis, due to unspecified organism, unspecified whether acute organ dysfunction present ([place])  Elevated troponin    Rx / DC Orders ED Discharge Orders     None         [place], Vonna Kotyk  S, MD 10/11/21 1749    Luna Fuse, MD 10/11/21 [phone]

## 2021-10-11 NOTE — Progress Notes (Signed)
Pharmacy Antibiotic Note  Martin [name] is a 61 y.o. male admitted on 10/11/2021 with sepsis.  Pharmacy has been consulted for vancomycin dosing.   WBC and SCr elevated. CrCl ~ 56 mL  Plan: -Vancomycin 1250 mg IV Q 24 hrs. Goal AUC 400-550. Expected AUC: 507  SCr used: 1.29 -F/u maintenance cefepime dose -Monitor CBC, renal fx, cultures and clinical progress -Vanc levels as indicated     Height: 5' 11"  (180.3 cm) Weight: 67.1 kg (148 lb) IBW/kg (Calculated) : 75.3  Temp (24hrs), Avg:99.5 F (37.5 C), Min:98 F (36.7 C), Max:100.9 F (38.3 C)  Recent Labs  Lab 10/11/21 1345 10/11/21 1544  WBC 19.0*  --   CREATININE 1.29*  --   LATICACIDVEN  --  2.9*    Estimated Creatinine Clearance: 56.4 mL/min (A) (by C-G formula based on SCr of 1.29 mg/dL (H)).    Allergies  Allergen Reactions   Yellow Jacket Venom [Bee Venom] Anaphylaxis   Aspirin Swelling   Codeine Nausea Only   Ibuprofen     On blood thinners   Morphine And Related Itching    Antimicrobials this admission: Cefepime 2/3 >>  Vancomycin 2/3 >>   Dose adjustments this admission:  Microbiology results: 2/3 BCx >> 2/3 UCx >>   Thank you for allowing pharmacy to be a part of this patients care.  Albertina Parr, PharmD., BCCCP Clinical Pharmacist Please refer to [facility] for unit-specific pharmacist

## 2021-10-11 NOTE — H&P (Signed)
History and Physical    Patient: Martin [name] BTD:[id number] DOB: 1961/03/25 DOA: 10/11/2021 DOS: the patient was seen and examined on 10/11/2021 PCP: Camillia Herter, NP  Patient coming from:  home by EMS  Chief Complaint:  Chief Complaint  Patient presents with   Dizziness    HPI: Martin [name] is a 61 y.o. 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 He activated EMS for constant, severe symptoms and was found to be febrile on arrival to 100.9F, normotensive. Labs indicated AKI with leukocytosis (WBC 19k), troponin elevation to 241 > 240 without ischemic ECG changes or chest pain reported. CRP 20.2, ESR 58. CXR without infiltrate, UA and UDS pending. EtOH undetectable. Influenza, covid-19 PCR negative. Blood cultures were sent, vancomycin and cefepime given, and admission to hospitalist service requested.  He denies current headache, vision changes, neck stiffness or pain, chest pain, dyspnea, abdominal pain, new wounds.  Confirms that he's had some cough that is somewhat chronic, nasal congestion, and feels crampy myalgias diffusely. No sick contacts. Last injected cocaine earlier in the day of symptom onset, no change in his source recently. Hasn't taken meds including methadone due to vomiting since then either. He has had sepsis before, but doesn't recognize endocarditis or blood stream infection or bacteremia.   Review of Systems: As mentioned in the history of present illness. All other systems reviewed and are negative. 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 Delana Meyer [facility]) Dec 2022: Percutaneous transluminal angioplasty left common femoral and superficial femoral artery to 7 mm  Nov 2022: Percutaneous transluminal angioplasty and stent placement right superficial femoral artery. Percutaneous transluminal angioplasty to 4 mm with a Lutonix drug-eluting balloon right posterior tibial artery.   Oct 2022: Percutaneous transluminal angioplasty left superficial femoral arteries to 6 mm with a Lutonix drug-eluting balloon. Percutaneous transluminal angioplasty left anterior tibial artery to 4 mm with a Lutonix drug-eluting balloon. Percutaneous transluminal angioplasty and stent placement right external iliac artery with a 9 mm x 60 mm life star stent.   Past Surgical History:  Procedure Laterality Date   APPENDECTOMY     age 71 or 81   EXAMINATION UNDER ANESTHESIA  10/14/2011   Procedure: EXAM UNDER ANESTHESIA;  Surgeon: Earnstine Regal, MD;  Location: [facility];  Service: General;  Laterality: N/A;  exam under anethesia, Excision of mass anal canal, 1.5cm  INGUINAL HERNIA REPAIR  05/11/2012   Procedure: HERNIA REPAIR INGUINAL ADULT;  Surgeon: Earnstine Regal, MD;  Location: [place];  Service: General;  Laterality: Left;  left inguinal hernia repair with mesh   LOWER EXTREMITY ANGIOGRAPHY Left 07/02/2021   Procedure: LOWER EXTREMITY ANGIOGRAPHY;  Surgeon: Katha Cabal, MD;   Location: [place] [facility];  Service: Cardiovascular;  Laterality: Left;   LOWER EXTREMITY ANGIOGRAPHY Right 07/16/2021   Procedure: LOWER EXTREMITY ANGIOGRAPHY;  Surgeon: Katha Cabal, MD;  Location: [place] [facility];  Service: Cardiovascular;  Laterality: Right;   LOWER EXTREMITY ANGIOGRAPHY Left 08/27/2021   Procedure: LOWER EXTREMITY ANGIOGRAPHY;  Surgeon: Katha Cabal, MD;  Location: [place] [facility];  Service: Cardiovascular;  Laterality: Left;   surgical pathology   10/14/2011   squamous cell ca of anus   transanal excision  10/14/2011   Dr.Todd Gerkin   Social History: Smokes 1 ppd for a long time, no EtOH, injects cocaine, last was 2/1.   Allergies  Allergen Reactions   Yellow Jacket Venom [Bee Venom] Anaphylaxis   Aspirin Swelling   Codeine Nausea Only   Ibuprofen     On blood thinners   Morphine And Related Itching    Family History  Problem Relation Age of Onset   Colon cancer Father    Asthma Father    Cancer Father        prostate   Hypertension Father    Asthma Mother    Hypertension Mother    Stomach cancer Neg Hx    Esophageal cancer Neg Hx    Meds: Says he only takes eliquis, plavix, flomax, and methadone 148m (last dose of all was 1/31 or 2/1)  Physical Exam: Vitals:   10/11/21 1715  BP: 138/60  Pulse: 63  Resp: 18  Temp:   TempSrc:   SpO2: 100%  Weight:   Height:   Gen: Chronically ill-appearing, thin male appears drowsy/lethargic but appropriately rousable and interactive. HEENT: Severe dental caries, largely edentulous with gingivitis. Pulm: Nonlabored breathing room air. Clear. CV: Regular rate and rhythm. No murmur, rub, or gallop. No JVD, no dependent edema. GI: Abdomen soft, , diffusely uncomfortable without point tenderness, nondistended, negative Murphy's, +BS. Ext: Warm, dry, diminished DP pulses, limited hair growth on legs.  Skin: Possible splinter hemorrhage on left index finger, diffuse tattoos, right AC fossa  cocaine injection site does appear to have phlebitis without surrounding erythema or fluctuance.  Neuro: Conversant, oriented, no meningismus or focal neurological deficits. Psych: Judgement and insight appear marginal. Calm.  Data Reviewed: ECG: NSR w/PVC x1, J point elevation most notable in mid precordial leads (which is present on prior ECGs), P wave inversions noted, no ischemic ST changes CXR: Emphysema without infiltrate Inflammatory markers up. LFTs reassuring. Lipase normal. Troponin 241 > 240.   Liver looked normal on CT A/P Feb 2021.   Assessment and Plan: Sepsis:  Unclear etiology, though obvious concern for bacteremia in IVDU. No murmur on exam, possible splinter hemorrhage (vs. incidental finding) noted. Evidence of phlebitis at injection site, no other obvious cutaneous sources. Very poor dentition possible source. - Give additional 1L NS bolus to approximate 3cc/kg, then D5LR at 100cc/hr. Trend lactic acid to normal.  - Monitor blood cultures. With his high risk, do not suspect we will deescalate antibiotics until 48 hours of culture data have returned.  - Continue vancomycin, cefepime which should provide adequate broad coverage. - Echocardiogram - Urinalysis still pending, though no urinary symptoms  reported.  - With severity of periodontal disease, will check orthopantogram  - Abd exam currently benign, though he has history of SBO and may need CT if source remains unclear. - Tylenol, antiemetics.  Cocaine use, IVDU:  - Cessation counseling. - HIV, RPR, Hep C w/reflex, Hep B Ab  OUD:  - I called [facility] which is closed and did not answer. We will call again in the AM to confirm patient's 191m daily dose.   Tobacco use:  - Cessation counseling - Nicotine patch  PVD: No critical limb ischemia noted.  - Continue eliquis (interestingly, on 2.579mBID per vascular surgery despite not meeting criteria for dose reduction, will not change dose at this  time), plavix. Recommended he be on statin, though he is reluctant. Will not start at this time.   BPH:  - Continue tamsulosin.   Demand myocardial ischemia: Troponin trend is flat, no chest pain, and no ischemic ECG changes - all arguing against ACS. Suspect demand ischemia due to sepsis.  - Restart eliquis, antiplatelet. Not on beta blocker, won't start pending UDS. - Echo as above to look for WMA as well.   AKI: Due to dehydration and sepsis. SCr 1.29 on admission from normal baseline (0.7-0.8).  - Continue IVF, no evidence of overload and lactate is still elevated.  - Monitor in AM.  Emphysema: No wheezing - Albuterol prn  Advance Care Planning: Full, confirmed with patient  Consults: None  Family Communication: None  Severity of Illness: The appropriate patient status for this patient is INPATIENT. Inpatient status is judged to be reasonable and necessary in order to provide the required intensity of service to ensure the patient's safety. The patient's presenting symptoms, physical exam findings, and initial radiographic and laboratory data in the context of their chronic comorbidities is felt to place them at high risk for further clinical deterioration. Furthermore, it is not anticipated that the patient will be medically stable for discharge from the hospital within 2 midnights of admission.   * I certify that at the point of admission it is my clinical judgment that the patient will require inpatient hospital care spanning beyond 2 midnights from the point of admission due to high intensity of service, high risk for further deterioration and high frequency of surveillance required.*  Author: RyPatrecia PourMD 10/11/2021 7:04 PM  For on call review [email or web address]

## 2021-10-12 ENCOUNTER — Inpatient Hospital Stay (HOSPITAL_COMMUNITY): Payer: Medicare Other

## 2021-10-12 DIAGNOSIS — B9561 Methicillin susceptible Staphylococcus aureus infection as the cause of diseases classified elsewhere: Secondary | ICD-10-CM | POA: Diagnosis not present

## 2021-10-12 DIAGNOSIS — N400 Enlarged prostate without lower urinary tract symptoms: Secondary | ICD-10-CM | POA: Diagnosis not present

## 2021-10-12 DIAGNOSIS — D649 Anemia, unspecified: Secondary | ICD-10-CM | POA: Diagnosis present

## 2021-10-12 DIAGNOSIS — I248 Other forms of acute ischemic heart disease: Secondary | ICD-10-CM

## 2021-10-12 DIAGNOSIS — J439 Emphysema, unspecified: Secondary | ICD-10-CM

## 2021-10-12 DIAGNOSIS — I739 Peripheral vascular disease, unspecified: Secondary | ICD-10-CM | POA: Diagnosis present

## 2021-10-12 DIAGNOSIS — R7881 Bacteremia: Secondary | ICD-10-CM | POA: Diagnosis not present

## 2021-10-12 DIAGNOSIS — Z720 Tobacco use: Secondary | ICD-10-CM | POA: Diagnosis present

## 2021-10-12 DIAGNOSIS — E785 Hyperlipidemia, unspecified: Secondary | ICD-10-CM

## 2021-10-12 DIAGNOSIS — D696 Thrombocytopenia, unspecified: Secondary | ICD-10-CM | POA: Diagnosis present

## 2021-10-12 DIAGNOSIS — J449 Chronic obstructive pulmonary disease, unspecified: Secondary | ICD-10-CM | POA: Diagnosis present

## 2021-10-12 DIAGNOSIS — A419 Sepsis, unspecified organism: Secondary | ICD-10-CM | POA: Diagnosis present

## 2021-10-12 DIAGNOSIS — C210 Malignant neoplasm of anus, unspecified: Secondary | ICD-10-CM

## 2021-10-12 DIAGNOSIS — N179 Acute kidney failure, unspecified: Secondary | ICD-10-CM | POA: Diagnosis not present

## 2021-10-12 DIAGNOSIS — G894 Chronic pain syndrome: Secondary | ICD-10-CM

## 2021-10-12 DIAGNOSIS — F1410 Cocaine abuse, uncomplicated: Secondary | ICD-10-CM | POA: Diagnosis present

## 2021-10-12 DIAGNOSIS — A4101 Sepsis due to Methicillin susceptible Staphylococcus aureus: Principal | ICD-10-CM

## 2021-10-12 DIAGNOSIS — K056 Periodontal disease, unspecified: Secondary | ICD-10-CM | POA: Diagnosis present

## 2021-10-12 LAB — IRON AND TIBC
Iron: 14 ug/dL — ABNORMAL LOW (ref 45–182)
Saturation Ratios: 5 % — ABNORMAL LOW (ref 17.9–39.5)
TIBC: 283 ug/dL (ref 250–450)
UIBC: 269 ug/dL

## 2021-10-12 LAB — BLOOD CULTURE ID PANEL (REFLEXED) - BCID2

## 2021-10-12 LAB — RETICULOCYTES
Immature Retic Fract: 6.5 % (ref 2.3–15.9)
RBC.: 3.22 MIL/uL — ABNORMAL LOW (ref 4.22–5.81)
Retic Count, Absolute: 31.9 10*3/uL (ref 19.0–186.0)
Retic Ct Pct: 1 % (ref 0.4–3.1)

## 2021-10-12 LAB — CBC
HCT: 28.2 % — ABNORMAL LOW (ref 39.0–52.0)
HCT: 29.4 % — ABNORMAL LOW (ref 39.0–52.0)
Hemoglobin: 9.8 g/dL — ABNORMAL LOW (ref 13.0–17.0)
Hemoglobin: 9.8 g/dL — ABNORMAL LOW (ref 13.0–17.0)
MCH: 30.6 pg (ref 26.0–34.0)
MCH: 30.3 pg (ref 26.0–34.0)
MCHC: 34.8 g/dL (ref 30.0–36.0)
MCHC: 33.3 g/dL (ref 30.0–36.0)
MCV: 88.1 fL (ref 80.0–100.0)
MCV: 91 fL (ref 80.0–100.0)
Platelets: 74 10*3/uL — ABNORMAL LOW (ref 150–400)
Platelets: 67 10*3/uL — ABNORMAL LOW (ref 150–400)
RBC: 3.2 MIL/uL — ABNORMAL LOW (ref 4.22–5.81)
RBC: 3.23 MIL/uL — ABNORMAL LOW (ref 4.22–5.81)
RDW: 14.2 % (ref 11.5–15.5)
RDW: 14.2 % (ref 11.5–15.5)
WBC: 11.3 10*3/uL — ABNORMAL HIGH (ref 4.0–10.5)
WBC: 7.9 10*3/uL (ref 4.0–10.5)
nRBC: 0 % (ref 0.0–0.2)
nRBC: 0 % (ref 0.0–0.2)

## 2021-10-12 LAB — URINALYSIS, MICROSCOPIC (REFLEX)
Bacteria, UA: NONE SEEN
Squamous Epithelial / HPF: NONE SEEN (ref 0–5)

## 2021-10-12 LAB — URINALYSIS, ROUTINE W REFLEX MICROSCOPIC
Bilirubin Urine: NEGATIVE
Glucose, UA: NEGATIVE mg/dL
Ketones, ur: NEGATIVE mg/dL
Leukocytes,Ua: NEGATIVE
Nitrite: NEGATIVE
Protein, ur: NEGATIVE mg/dL
Specific Gravity, Urine: 1.02 (ref 1.005–1.030)
pH: 6 (ref 5.0–8.0)

## 2021-10-12 LAB — RAPID URINE DRUG SCREEN, HOSP PERFORMED
Amphetamines: NOT DETECTED
Barbiturates: NOT DETECTED
Benzodiazepines: NOT DETECTED
Cocaine: POSITIVE — AB
Opiates: POSITIVE — AB
Tetrahydrocannabinol: POSITIVE — AB

## 2021-10-12 LAB — ECHOCARDIOGRAM COMPLETE
AR max vel: 1.9 cm2
AV Area VTI: 1.92 cm2
AV Area mean vel: 1.87 cm2
AV Mean grad: 9.5 mmHg
AV Peak grad: 17.4 mmHg
Ao pk vel: 2.09 m/s
Area-P 1/2: 3.37 cm2
Height: 71 in
P 1/2 time: 351 msec
S' Lateral: 3.4 cm
Weight: 2211.65 oz

## 2021-10-12 LAB — BASIC METABOLIC PANEL
Anion gap: 8 (ref 5–15)
BUN: 18 mg/dL (ref 8–23)
CO2: 19 mmol/L — ABNORMAL LOW (ref 22–32)
Calcium: 8.1 mg/dL — ABNORMAL LOW (ref 8.9–10.3)
Chloride: 103 mmol/L (ref 98–111)
Creatinine, Ser: 0.83 mg/dL (ref 0.61–1.24)
GFR, Estimated: >60 mL/min (ref >60)
Glucose, Bld: 113 mg/dL — ABNORMAL HIGH (ref 70–99)
Potassium: 3.8 mmol/L (ref 3.5–5.1)
Sodium: 130 mmol/L — ABNORMAL LOW (ref 135–145)

## 2021-10-12 LAB — VITAMIN B12: Vitamin B-12: 114 pg/mL — ABNORMAL LOW (ref 180–914)

## 2021-10-12 LAB — RPR: RPR Ser Ql: NONREACTIVE

## 2021-10-12 LAB — FOLATE: Folate: 17.5 ng/mL (ref >5.9)

## 2021-10-12 LAB — FERRITIN: Ferritin: 93 ng/mL (ref 24–336)

## 2021-10-12 MED ORDER — CEFAZOLIN SODIUM-DEXTROSE 2-4 GM/100ML-% IV SOLN
2.0000 g | Freq: Three times a day (TID) | INTRAVENOUS | Status: DC
  Administered 2021-10-12 – 2021-10-17 (×15): 2 g via INTRAVENOUS
  Filled 2021-10-12 (×15): qty 100

## 2021-10-12 MED ORDER — METHADONE HCL 10 MG PO TABS
115.0000 mg | ORAL_TABLET | Freq: Every day | ORAL | Status: DC
  Administered 2021-10-12 – 2021-10-17 (×6): 115 mg via ORAL
  Filled 2021-10-12: qty 12
  Filled 2021-10-12: qty 10
  Filled 2021-10-12: qty 12
  Filled 2021-10-12: qty 2
  Filled 2021-10-12 (×3): qty 12

## 2021-10-12 MED ORDER — ALUM & MAG HYDROXIDE-SIMETH 200-200-20 MG/5ML PO SUSP
15.0000 mL | Freq: Four times a day (QID) | ORAL | Status: DC | PRN
  Administered 2021-10-12: 15 mL via ORAL
  Filled 2021-10-12: qty 30

## 2021-10-12 NOTE — Consult Note (Signed)
[facility]       Reason for Consult: Staph aureus bacteremia    Referring Physician: CHAMP autoconsult  Principal Problem:   MSSA bacteremia Active Problems:   Anal cancer   Chronic pain syndrome   Hyperlipidemia   Sepsis   Peripheral vascular disease   AKI   Tobacco use   Emphysema   BPH    Periodontal disease   IVDU, cocaine use   Demand ischemia   Thrombocytopenia   Normocytic anemia    apixaban  2.5 mg Oral BID   clopidogrel  75 mg Oral Daily   folic acid  1 mg Oral Daily   methadone  115 mg Oral Daily   multivitamin with minerals  1 tablet Oral Daily   nicotine  21 mg Transdermal Daily   sodium chloride flush  3 mL Intravenous Q12H   tamsulosin  0.4 mg Oral QHS   thiamine  100 mg Oral Daily    Recommendations: Cefazolin TTE Repeat blood cultures  Assessment: He has recent drug use and now with bacteremia.  As above, checking for endocarditis, repeating blood cultures.   Will follow up again on Monday  Antibiotics: Vancomycin and cefepime, now changed to cefazolin  HPI: Martin [name] is a 61 y.o. male with a history of anal cancer, chemoradiation, PVD and recent lower extremity stent placements who came in via EMS with fatigue, weakness and febrile.  Blood cultures now positive for MSSA on BCID and on cefazolin.  He continues to feel fatigued.  Fever and WBC trending down.  He denies any new back pain.  He uses iv drugs, cocaine, opiates and marijuana positive on his drug screen.    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    Anxiety    Arthritis    DDD lumbar, arthritis knees   DJD (degenerative joint disease) of lumbar  spine    GERD (gastroesophageal reflux disease)    Hemorrhoid    internal   History of bowel resection    Hypertension    EKG 12/12 EPIC   no  PCP- states increased lately but hasnt been diagnosed formally   Inguinal hernia    Peripheral vascular disease ([place])    Pneumonia    as child, cough at present with no fever   Pneumonia    as child, cough at present with no fever    Radiation 11/19/11-01/08/12   5040 cGy 28 fx Pelvis and inguinal area    Social History   Tobacco Use   Smoking status: Every Day    Packs/day: 1.00    Years: 47.00    Pack years: 47.00    Types: Cigarettes   Smokeless tobacco: Never   Tobacco comments:    Cutting back>> Quit Smart card supplied  Substance Use Topics   Alcohol use: Not Currently    Comment: stopped years ago   Drug use: Yes    Types: Marijuana, Cocaine    Comment: marijuana few days ago, cocaine few days ago    Family History  Problem Relation Age of Onset   Colon cancer Father    Asthma Father    Cancer Father  prostate   Hypertension Father    Asthma Mother    Hypertension Mother    Stomach cancer Neg Hx    Esophageal cancer Neg Hx     Allergies  Allergen Reactions   Yellow Jacket Venom [Bee Venom] Anaphylaxis   Aspirin Swelling   Codeine Nausea Only   Ibuprofen     On blood thinners   Morphine And Related Itching    Physical Exam: Constitutional: in no apparent distress  Vitals:   10/12/21 0227 10/12/21 0400  BP: (!) 103/50 (!) 98/54  Pulse: 76 64  Resp: 19 18  Temp: 99 F (37.2 C) 98.7 F (37.1 C)  SpO2: 98% 97%   EYES: anicteric ENMT: no thrush Cardiovascular: RRR Respiratory: normal respiratory effort GI: soft Musculoskeletal: no edema Skin: no rashes Neuro: non-focal  Lab Results  Component Value Date   WBC 7.9 10/12/2021   HGB 9.8 (L) 10/12/2021   HCT 29.4 (L) 10/12/2021   MCV 91.0 10/12/2021   PLT 67 (L) 10/12/2021    Lab Results  Component Value Date   CREATININE 0.83 10/12/2021    BUN 18 10/12/2021   NA 130 (L) 10/12/2021   K 3.8 10/12/2021   CL 103 10/12/2021   CO2 19 (L) 10/12/2021    Lab Results  Component Value Date   ALT 15 10/11/2021   AST 26 10/11/2021   ALKPHOS 84 10/11/2021     Microbiology: Recent Results (from the past 240 hour(s))  Resp Panel by RT-PCR (Flu A&B, Covid)     Status: None   Collection Time: 10/11/21  3:23 PM   Specimen: Nasopharyngeal(NP) swabs in vial transport medium  Result Value Ref Range Status   SARS Coronavirus 2 by RT PCR NEGATIVE NEGATIVE Final    Comment: (NOTE) SARS-CoV-2 target nucleic acids are NOT DETECTED.  The SARS-CoV-2 RNA is generally detectable in upper respiratory specimens during the acute phase of infection. The lowest concentration of SARS-CoV-2 viral copies this assay can detect is 138 copies/mL. A negative result does not preclude SARS-Cov-2 infection and should not be used as the sole basis for treatment or other patient management decisions. A negative result may occur with  improper specimen collection/handling, submission of specimen other than nasopharyngeal swab, presence of viral mutation(s) within the areas targeted by this assay, and inadequate number of viral copies(<138 copies/mL). A negative result must be combined with clinical observations, patient history, and epidemiological information. The expected result is Negative.  Fact Sheet for Patients:  EntrepreneurPulse.com.au  Fact Sheet for Healthcare Providers:  IncredibleEmployment.be  This test is no t yet approved or cleared by the [place] FDA and  has been authorized for detection and/or diagnosis of SARS-CoV-2 by FDA under an Emergency Use Authorization (EUA). This EUA will remain  in effect (meaning this test can be used) for the duration of the COVID-19 declaration under Section 564(b)(1) of the Act, 21 U.S.C.section 360bbb-3(b)(1), unless the authorization is terminated  or  revoked sooner.       Influenza A by PCR NEGATIVE NEGATIVE Final   Influenza B by PCR NEGATIVE NEGATIVE Final    Comment: (NOTE) The Xpert Xpress SARS-CoV-2/FLU/RSV plus assay is intended as an aid in the diagnosis of influenza from Nasopharyngeal swab specimens and should not be used as a sole basis for treatment. Nasal washings and aspirates are unacceptable for Xpert Xpress SARS-CoV-2/FLU/RSV testing.  Fact Sheet for Patients: EntrepreneurPulse.com.au  Fact Sheet for Healthcare Providers: IncredibleEmployment.be  This test is not yet approved or cleared  by the [place] and has been authorized for detection and/or diagnosis of SARS-CoV-2 by FDA under an Emergency Use Authorization (EUA). This EUA will remain in effect (meaning this test can be used) for the duration of the COVID-19 declaration under Section 564(b)(1) of the Act, 21 U.S.C. section 360bbb-3(b)(1), unless the authorization is terminated or revoked.  Performed at [facility], [place] [address], [place], [place] [address]   Culture, blood (routine x 2)     Status: None (Preliminary result)   Collection Time: 10/11/21  3:30 PM   Specimen: BLOOD LEFT HAND  Result Value Ref Range Status   Specimen Description BLOOD LEFT HAND  Final   Special Requests   Final    BOTTLES DRAWN AEROBIC AND ANAEROBIC Blood Culture adequate volume   Culture  Setup Time   Final    GRAM POSITIVE COCCI IN CLUSTERS IN BOTH AEROBIC AND ANAEROBIC BOTTLES CRITICAL RESULT CALLED TO, READ BACK BY AND VERIFIED WITH: V BRYK,PHARMD@[id number]  10/12/21 [place]    Culture   Final    NO GROWTH < 24 HOURS Performed at [facility], [place] [address] [place], [place] [address]    Report Status PENDING  Incomplete  Blood Culture ID Panel (Reflexed)     Status: Abnormal   Collection Time: 10/11/21  3:30 PM  Result Value Ref Range Status   Enterococcus faecalis NOT DETECTED NOT DETECTED Final    Enterococcus Faecium NOT DETECTED NOT DETECTED Final   Listeria monocytogenes NOT DETECTED NOT DETECTED Final   Staphylococcus species DETECTED (A) NOT DETECTED Final    Comment: CRITICAL RESULT CALLED TO, READ BACK BY AND VERIFIED WITH: V BRYK,PHARMD@0523  10/12/21 [place]    Staphylococcus aureus (BCID) DETECTED (A) NOT DETECTED Final    Comment: CRITICAL RESULT CALLED TO, READ BACK BY AND VERIFIED WITH: V BRYK,PHARMD@0523  10/12/21 [place]    Staphylococcus epidermidis NOT DETECTED NOT DETECTED Final   Staphylococcus lugdunensis NOT DETECTED NOT DETECTED Final   Streptococcus species NOT DETECTED NOT DETECTED Final   Streptococcus agalactiae NOT DETECTED NOT DETECTED Final   Streptococcus pneumoniae NOT DETECTED NOT DETECTED Final   Streptococcus pyogenes NOT DETECTED NOT DETECTED Final   A.calcoaceticus-baumannii NOT DETECTED NOT DETECTED Final   Bacteroides fragilis NOT DETECTED NOT DETECTED Final   Enterobacterales NOT DETECTED NOT DETECTED Final   Enterobacter cloacae complex NOT DETECTED NOT DETECTED Final   Escherichia coli NOT DETECTED NOT DETECTED Final   Klebsiella aerogenes NOT DETECTED NOT DETECTED Final   Klebsiella oxytoca NOT DETECTED NOT DETECTED Final   Klebsiella pneumoniae NOT DETECTED NOT DETECTED Final   Proteus species NOT DETECTED NOT DETECTED Final   Salmonella species NOT DETECTED NOT DETECTED Final   Serratia marcescens NOT DETECTED NOT DETECTED Final   Haemophilus influenzae NOT DETECTED NOT DETECTED Final   Neisseria meningitidis NOT DETECTED NOT DETECTED Final   Pseudomonas aeruginosa NOT DETECTED NOT DETECTED Final   Stenotrophomonas maltophilia NOT DETECTED NOT DETECTED Final   Candida albicans NOT DETECTED NOT DETECTED Final   Candida auris NOT DETECTED NOT DETECTED Final   Candida glabrata NOT DETECTED NOT DETECTED Final   Candida krusei NOT DETECTED NOT DETECTED Final   Candida parapsilosis NOT DETECTED NOT DETECTED Final   Candida tropicalis NOT DETECTED  NOT DETECTED Final   Cryptococcus neoformans/gattii NOT DETECTED NOT DETECTED Final   Meth resistant mecA/C and MREJ NOT DETECTED NOT DETECTED Final    Comment: Performed at [facility], [address], [place], [place]  [address]  Culture, blood (routine x 2)     Status: None (Preliminary result)   Collection Time: 10/11/21  3:35 PM   Specimen: BLOOD LEFT FOREARM  Result Value Ref Range Status   Specimen Description BLOOD LEFT FOREARM  Final   Special Requests   Final    BOTTLES DRAWN AEROBIC AND ANAEROBIC Blood Culture adequate volume   Culture  Setup Time   Final    GRAM POSITIVE COCCI IN CLUSTERS IN BOTH AEROBIC AND ANAEROBIC BOTTLES CRITICAL VALUE NOTED.  VALUE IS CONSISTENT WITH PREVIOUSLY REPORTED AND CALLED VALUE.    Culture   Final    NO GROWTH < 24 HOURS Performed at [facility], [place] [address], [place], [place] [address]    Report Status PENDING  Incomplete    Thayer Headings, [facility] [place] [email or web address] 10/12/2021, 4:10 PM

## 2021-10-12 NOTE — Assessment & Plan Note (Addendum)
Troponin trend is flat, no chest pain, and no ischemic ECG changes, no WMA on echo. Suspect demand ischemia due to sepsis.  - Restarted eliquis, antiplatelet. Not on beta blocker, won't start with +cocaine on UDS.

## 2021-10-12 NOTE — Progress Notes (Signed)
Progress Note  Patient: Martin [name] OLM:[id number] DOB: 01/02/1961 DOA: 10/11/2021  DOS: 10/12/2021  LOS: 1 days   Brief hospital course: NASIRE REALI is a 61 y.o. 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 Labs indicated AKI (SCr 1.29) and demand myocardial ischemia (troponin 241 > 240 without angina) with leukocytosis (WBC 19k). Vancomycin and cefepime were started on admission, changed to ancef when blood cultures grew MSSA.  Assessment and Plan: * MSSA bacteremia- (present on admission) Likely due to cocaine IVDU. Note phlebitis at left [facility] site without fluctuance. - Convert to ancef 2g IV q8h pending susceptibility data.  - Repeat blood cultures - Echocardiogram pending.  - Implications on disposition, i.e. that he will require at least 4 weeks of IV antibiotics and is not eligible to be safely discharged home, have been shared with patient who currently has no objection with future attempts at facility placement pending clinical course/work up.   Normocytic anemia- (present on admission) - Check anemia panel. 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 Certainly at risk of nutritional deficiencies and AOCD.   Thrombocytopenia- (present on admission) Acute, worsened with IV fluids (all cell lines down). Suspect due to sepsis. Had this during 2013 as well.  - Recheck CBC this PM to confirm stability.  - If bleeding develops or if level goes < 50k, will hold eliquis.  Demand ischemia- (present on admission) Troponin trend is flat, no chest pain, and no ischemic ECG changes - all arguing against ACS. Suspect demand ischemia due to sepsis.  -  Restart eliquis, antiplatelet. Not on beta blocker, won't start with +cocaine on UDS. - Echo as above to look for WMA as well.  IVDU, cocaine use- (present on admission) Cessation counseling provided. HIV and RPR nonreactive - Hepatitis B and C assays pending.  Periodontal disease- (present on admission) No large abscess on orthopantogram. Needs significant excisions but not urgently.  BPH - (present on admission) - Continue tamsulosin  Emphysema- (present on admission) No wheezing.  - Continue prn albuterol  Tobacco use- (present on admission) - Cessation counseling provided, nicotine patch ordered.  AKI- (present on admission) Due to dehydration and sepsis. SCr 1.29 on admission from normal baseline (0.7-0.8). Improving. - Continue IVF, no evidence of overload and lactate is still elevated.  - Monitor serially  Peripheral vascular disease- (present on admission) No critical limb ischemia noted.  - Continue eliquis (interestingly, on 2.21m BID per vascular surgery despite not meeting criteria for dose reduction, will not change dose at this time), plavix.  - Recommended he be on statin, though he is reluctant. Will not start at this time.   Sepsis- (present on admission) Due to MSSA bacteremia. Sepsis physiology resolving.  Chronic pain syndrome- (present on admission) Confirmed methadone dosing and alerted [facility] to patient's current admission.  - Continue methadone 1178mdaily chronic dose  Anal cancer- (present on admission) History of excision, chemotherapy, and radiation, diagnosed 2013.    Subjective: Feels somewhat less fatigued, though still sore everywhere. Hasn't gotten AM dose of methadone yet as of encounter time. No fevers/chills. No leg swelling, chest pain, dyspnea. No urinary complaints or new wounds.  Objective: Vitals:   10/11/21  1900 10/11/21 2031 10/12/21 0227 10/12/21 0400  BP: (!) 151/77 128/61 (!) 103/50 (!) 98/54  Pulse: 81  76 64   Resp: (!) 32 20 19 18   Temp:  99.3 F (37.4 C) 99 F (37.2 C) 98.7 F (37.1 C)  TempSrc:  Oral Axillary Oral  SpO2: 100% 100% 98% 97%  Weight:  62.7 kg    Height:  5' 11"  (1.803 m)     Gen: Thin, chronically ill-appearing 61 y.o. male in no distress Pulm: Nonlabored breathing room air.  CV: Regular rate and rhythm. No murmur, rub, or gallop. No JVD, no dependent edema. GI: Abdomen soft, non-tender, non-distended, with normoactive bowel sounds.  Ext: Warm, no deformities Skin: No new rashes, lesions or ulcers on visualized skin. Anal area without significant wound. R AC fossa with stable hardened vein no fluctuance or tenderness. Left index finger with small hyperpigmented streak in nail bed. Neuro: Alert and oriented. No focal neurological deficits. Psych: Judgement and insight appear fair. Mood euthymic & affect congruent. Behavior is appropriate.    Data Personally reviewed: WBC 19 > 11 Hgb 12 > 9.8 Plt 113 > 74.  Creatinine 1.29 > 0.83 Albumin 3.2 LFTs wnl UA unremarkable CXR Emphysema, no infiltrate HIV NR; RPR negative.  Blood cultures 2/4: MSSA, susceptibilities pending   Orthopantogram 10/11/2021 Limited examination due to patient motion demonstrating diffusely poor dentition with multiple tooth extractions, dental caries, and periapical lucencies suggesting periodontal disease. SARS-CoV-2 PCR (2/3): Negative Influenza A/B: Negative  Family Communication: None at bedside  Disposition: Status is: Inpatient Remains inpatient appropriate because: For continued IV antibiotics and work up of MSSA bacteremia Planned Discharge Destination: TBD  Patrecia Pour, MD 10/12/2021 2:40 PM Page by [email or web address]

## 2021-10-12 NOTE — Assessment & Plan Note (Addendum)
Acute, worsened with IV fluids (all cell lines down). Suspect due to sepsis. Had this during 2013 as well. Stabilized > 50 k for now with immature fraction elevated.  - Monitor prior to TEE.

## 2021-10-12 NOTE — Assessment & Plan Note (Signed)
Confirmed methadone dosing and alerted [facility] to patient's current admission.  - Continue methadone 1102m daily chronic dose

## 2021-10-12 NOTE — Assessment & Plan Note (Addendum)
Stable. 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

## 2021-10-12 NOTE — Assessment & Plan Note (Signed)
No critical limb ischemia noted.  - Continue eliquis (interestingly, on 2.63m BID per vascular surgery despite not meeting criteria for dose reduction, will not change dose at this time), plavix.  - Recommended he be on statin, though he is reluctant. Will not start at this time.

## 2021-10-12 NOTE — Progress Notes (Signed)
PHARMACY - PHYSICIAN COMMUNICATION CRITICAL VALUE ALERT - BLOOD CULTURE IDENTIFICATION (BCID)  Martin [name] is an 61 y.o. male who presented to [facility] on 10/11/2021 with a chief complaint of dizziness, HA, and N/V.  Assessment:  Started on broad-spectrum ABX for sepsis of unknown etiology with concern for bacteremia given IVDU, now growing MSSA in 4 of 4 blood cx bottles.  Name of physician (or Provider) ContactedWallie Char MD and Clovis Fredrickson MD  Current antibiotics: vancomycin and cefepime  Changes to prescribed antibiotics recommended:  Recommendations accepted by provider -- narrow to Ancef 2g IV Q8H.  Results for orders placed or performed during the hospital encounter of 10/11/21  Blood Culture ID Panel (Reflexed) (Collected: 10/11/2021  3:30 PM)  Result Value Ref Range   Enterococcus faecalis NOT DETECTED NOT DETECTED   Enterococcus Faecium NOT DETECTED NOT DETECTED   Listeria monocytogenes NOT DETECTED NOT DETECTED   Staphylococcus species DETECTED (A) NOT DETECTED   Staphylococcus aureus (BCID) DETECTED (A) NOT DETECTED   Staphylococcus epidermidis NOT DETECTED NOT DETECTED   Staphylococcus lugdunensis NOT DETECTED NOT DETECTED   Streptococcus species NOT DETECTED NOT DETECTED   Streptococcus agalactiae NOT DETECTED NOT DETECTED   Streptococcus pneumoniae NOT DETECTED NOT DETECTED   Streptococcus pyogenes NOT DETECTED NOT DETECTED   A.calcoaceticus-baumannii NOT DETECTED NOT DETECTED   Bacteroides fragilis NOT DETECTED NOT DETECTED   Enterobacterales NOT DETECTED NOT DETECTED   Enterobacter cloacae complex NOT DETECTED NOT DETECTED   Escherichia coli NOT DETECTED NOT DETECTED   Klebsiella aerogenes NOT DETECTED NOT DETECTED   Klebsiella oxytoca NOT DETECTED NOT DETECTED   Klebsiella pneumoniae NOT DETECTED NOT DETECTED   Proteus species NOT DETECTED NOT DETECTED   Salmonella species NOT DETECTED NOT DETECTED   Serratia marcescens NOT DETECTED NOT DETECTED    Haemophilus influenzae NOT DETECTED NOT DETECTED   Neisseria meningitidis NOT DETECTED NOT DETECTED   Pseudomonas aeruginosa NOT DETECTED NOT DETECTED   Stenotrophomonas maltophilia NOT DETECTED NOT DETECTED   Candida albicans NOT DETECTED NOT DETECTED   Candida auris NOT DETECTED NOT DETECTED   Candida glabrata NOT DETECTED NOT DETECTED   Candida krusei NOT DETECTED NOT DETECTED   Candida parapsilosis NOT DETECTED NOT DETECTED   Candida tropicalis NOT DETECTED NOT DETECTED   Cryptococcus neoformans/gattii NOT DETECTED NOT DETECTED   Meth resistant mecA/C and MREJ NOT DETECTED NOT DETECTED    Wynona Neat, PharmD, BCPS  10/12/2021  6:42 AM

## 2021-10-12 NOTE — Assessment & Plan Note (Addendum)
Likely due to cocaine IVDU.  - Converted to ancef 2g IV q8h. Pansensitivity is reported. ID auto-consulted. - Repeat blood cultures 2/4 NGTD x3 days - Echocardiogram cannot rule out AV vegetation.  TEE was performed on 2/8, no evidence of endocarditis . -Antibiotics management per ID, will await further recommendations.

## 2021-10-12 NOTE — Assessment & Plan Note (Signed)
No wheezing.  - Continue prn albuterol

## 2021-10-12 NOTE — Assessment & Plan Note (Signed)
Continue tamsulosin.

## 2021-10-12 NOTE — Assessment & Plan Note (Addendum)
Due to MSSA bacteremia. Sepsis physiology resolving.

## 2021-10-12 NOTE — Assessment & Plan Note (Signed)
-   Cessation counseling provided, nicotine patch ordered.

## 2021-10-12 NOTE — Assessment & Plan Note (Addendum)
Cessation counseling provided. HIV and RPR nonreactive, HepBAb negative. - Hepatitis C pending - This patient cannot be discharged to an unsupervised environment with PICC line.

## 2021-10-12 NOTE — Hospital Course (Addendum)
Martin [name] is a 61 y.o. 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 Labs indicated AKI (SCr 1.29) and demand myocardial ischemia (troponin 241 > 240 without angina) with leukocytosis (WBC 19k). Vancomycin and cefepime were started on admission, changed to ancef when blood cultures grew MSSA. Repeat blood cultures on 2/5 have remained negative. TTE was indeterminate for aortic valve vegetation.  He went for TEE on 2/8, with no evidence of endocarditis.

## 2021-10-12 NOTE — Progress Notes (Signed)
°  Echocardiogram 2D Echocardiogram has been performed.  Elmer Ramp 10/12/2021, 4:47 PM

## 2021-10-12 NOTE — Assessment & Plan Note (Signed)
No large abscess on orthopantogram. Needs significant excisions but not urgently.

## 2021-10-12 NOTE — Assessment & Plan Note (Signed)
History of excision, chemotherapy, and radiation, diagnosed 2013.

## 2021-10-12 NOTE — Assessment & Plan Note (Addendum)
Due to dehydration and sepsis. SCr 1.29 on admission, has returned to baseline 0.7-0.8.

## 2021-10-13 DIAGNOSIS — R7881 Bacteremia: Secondary | ICD-10-CM | POA: Diagnosis not present

## 2021-10-13 DIAGNOSIS — N400 Enlarged prostate without lower urinary tract symptoms: Secondary | ICD-10-CM | POA: Diagnosis not present

## 2021-10-13 DIAGNOSIS — N179 Acute kidney failure, unspecified: Secondary | ICD-10-CM | POA: Diagnosis not present

## 2021-10-13 DIAGNOSIS — C210 Malignant neoplasm of anus, unspecified: Secondary | ICD-10-CM | POA: Diagnosis not present

## 2021-10-13 LAB — CBC
HCT: 29.9 % — ABNORMAL LOW (ref 39.0–52.0)
Hemoglobin: 9.8 g/dL — ABNORMAL LOW (ref 13.0–17.0)
MCH: 30.1 pg (ref 26.0–34.0)
MCHC: 32.8 g/dL (ref 30.0–36.0)
MCV: 91.7 fL (ref 80.0–100.0)
Platelets: 68 10*3/uL — ABNORMAL LOW (ref 150–400)
RBC: 3.26 MIL/uL — ABNORMAL LOW (ref 4.22–5.81)
RDW: 14.2 % (ref 11.5–15.5)
WBC: 7.1 10*3/uL (ref 4.0–10.5)
nRBC: 0 % (ref 0.0–0.2)

## 2021-10-13 LAB — BASIC METABOLIC PANEL
Anion gap: 10 (ref 5–15)
BUN: 11 mg/dL (ref 8–23)
CO2: 23 mmol/L (ref 22–32)
Calcium: 8.4 mg/dL — ABNORMAL LOW (ref 8.9–10.3)
Chloride: 102 mmol/L (ref 98–111)
Creatinine, Ser: 0.78 mg/dL (ref 0.61–1.24)
GFR, Estimated: >60 mL/min (ref >60)
Glucose, Bld: 104 mg/dL — ABNORMAL HIGH (ref 70–99)
Potassium: 3.5 mmol/L (ref 3.5–5.1)
Sodium: 135 mmol/L (ref 135–145)

## 2021-10-13 LAB — HEPATITIS B SURFACE ANTIBODY, QUANTITATIVE: Hep B S AB Quant (Post): <3.1 m[IU]/mL — ABNORMAL LOW (ref >9.9)

## 2021-10-13 LAB — IMMATURE PLATELET FRACTION: Immature Platelet Fraction: 8.7 % — ABNORMAL HIGH (ref 1.2–8.6)

## 2021-10-13 MED ORDER — LORAZEPAM 1 MG PO TABS
0.0000 mg | ORAL_TABLET | Freq: Four times a day (QID) | ORAL | Status: AC
  Administered 2021-10-13 (×2): 3 mg via ORAL
  Filled 2021-10-13 (×2): qty 3
  Filled 2021-10-13: qty 1

## 2021-10-13 MED ORDER — LORAZEPAM 1 MG PO TABS: 0.0000 mg | ORAL_TABLET | Freq: Two times a day (BID) | ORAL | Status: AC

## 2021-10-13 MED ORDER — THIAMINE HCL 100 MG/ML IJ SOLN: 100.0000 mg | Freq: Every day | INTRAMUSCULAR | Status: DC

## 2021-10-13 MED ORDER — ADULT MULTIVITAMIN W/MINERALS CH: 1.0000 | ORAL_TABLET | Freq: Every day | ORAL | Status: DC

## 2021-10-13 MED ORDER — CYANOCOBALAMIN 1000 MCG/ML IJ SOLN: 1000.0000 ug | INTRAMUSCULAR | Status: DC

## 2021-10-13 MED ORDER — MELATONIN 5 MG PO TABS
5.0000 mg | ORAL_TABLET | Freq: Once | ORAL | Status: AC
Start: 2021-10-13 — End: 2021-10-13
  Administered 2021-10-13: 5 mg via ORAL
  Filled 2021-10-13: qty 1

## 2021-10-13 MED ORDER — THIAMINE HCL 100 MG PO TABS: 100.0000 mg | ORAL_TABLET | Freq: Every day | ORAL | Status: DC

## 2021-10-13 MED ORDER — CYANOCOBALAMIN 1000 MCG/ML IJ SOLN
1000.0000 ug | Freq: Every day | INTRAMUSCULAR | Status: DC
  Administered 2021-10-13 – 2021-10-17 (×5): 1000 ug via INTRAMUSCULAR
  Filled 2021-10-13 (×5): qty 1

## 2021-10-13 MED ORDER — LORAZEPAM 1 MG PO TABS: 1.0000 mg | ORAL_TABLET | ORAL | Status: AC | PRN

## 2021-10-13 MED ORDER — FOLIC ACID 1 MG PO TABS: 1.0000 mg | ORAL_TABLET | Freq: Every day | ORAL | Status: DC

## 2021-10-13 MED ORDER — LORAZEPAM 2 MG/ML IJ SOLN: 1.0000 mg | INTRAMUSCULAR | Status: AC | PRN

## 2021-10-13 NOTE — Assessment & Plan Note (Signed)
Likely causing an element of peripheral neuropathy, as this was noted in PMH. Confirmed to be low here.  - IM supplementation daily x1 week, weekly x1 month, then monthly

## 2021-10-13 NOTE — Progress Notes (Signed)
Progress Note  Patient: Martin [name] WGY:[id number] DOB: [birth year] DOA: 10/11/2021  DOS: 10/13/2021  LOS: 2 days   Brief hospital course: ISAUL LANDI is a 61 y.o. 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 Labs indicated AKI (SCr 1.29) and demand myocardial ischemia (troponin 241 > 240 without angina) with leukocytosis (WBC 19k). Vancomycin and cefepime were started on admission, changed to ancef when blood cultures grew MSSA.  Assessment and Plan: * MSSA bacteremia- (present on admission) Likely due to cocaine IVDU. Note phlebitis at left [facility] site without fluctuance. - Converted to ancef 2g IV q8h pending susceptibility data.  - Repeat blood cultures 2/5 NGTD - Echocardiogram cannot rule out AV vegetation. TEE requested, planning 2/7 per discussion with cardiology. - Implications on disposition, i.e. that he will require at least 4 weeks of IV antibiotics and is not eligible to be safely discharged home, have been shared with patient who currently has no objection with future attempts at facility placement pending clinical course/work up.   Normocytic anemia- (present on admission) Stable. 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  Thrombocytopenia- (present on admission) Acute, worsened with IV fluids (all cell lines down). Suspect due to sepsis. Had this during 2013 as well.  - Stabilized > 50 k for now with immature fraction elevated. Will monitor daily, ok to continue low dose eliquis in absence of bleeding.  Demand ischemia- (present on admission) Troponin trend is flat, no chest pain, and no ischemic ECG changes - all arguing against ACS. Suspect demand ischemia due to sepsis.  -  Restart eliquis, antiplatelet. Not on beta blocker, won't start with +cocaine on UDS. - Echo as above to look for WMA as well.  IVDU, cocaine use- (present on admission) Cessation counseling provided. HIV and RPR nonreactive - Hepatitis B and C assays pending.  Periodontal disease- (present on admission) No large abscess on orthopantogram. Needs significant excisions but not urgently.  BPH - (present on admission) - Continue tamsulosin  Emphysema- (present on admission) No wheezing.  - Continue prn albuterol  Tobacco use- (present on admission) - Cessation counseling provided, nicotine patch ordered.  AKI- (present on admission) Due to dehydration and sepsis. SCr 1.29 on admission from normal baseline (0.7-0.8). Improving. - Continue IVF, no evidence of overload and lactate is still elevated.  - Monitor serially  Peripheral vascular disease- (present on admission) No critical limb ischemia noted.  - Continue eliquis (interestingly, on 2.34m BID per vascular surgery despite not meeting criteria for dose reduction, will not change dose at this time), plavix.  - Recommended he be on statin, though he is reluctant. Will not start at this time.   Sepsis- (present on admission) Due to MSSA bacteremia. Sepsis physiology resolving.  Chronic pain syndrome- (present on admission) Confirmed methadone dosing and alerted [facility] to patient's current admission.  - Continue methadone 1184mdaily chronic dose  Vitamin B12 deficiency- (present on admission) Likely causing an element of peripheral neuropathy, [place]  - IM supplementation daily x1 week, weekly x1 month, then monthly  Anal cancer- (present on admission) History of excision, chemotherapy, and radiation, diagnosed 2013.    Subjective: No new complaints. Very insistent he doesn't  drink regularly. He is having a harder time being redirectable but is oriented, not agitated.  No chest pain or dyspnea or neck pain/stiffness.   Objective: Vitals:   10/13/21 0037 10/13/21 0300 10/13/21 0358 10/13/21 0842  BP: (!) 116/57 120/61 (!) 103/55 (!) 118/46  Pulse: 76 (!) 56 (!) 57 72  Resp: 20 14 12 20   Temp: 98.8 F (37.1 C)  98.9 F (37.2 C) 97.7 F (36.5 C)  TempSrc: Oral  Oral Oral  SpO2: 100% 96% 93% 98%  Weight:      Height:      Gen: 61 y.o. male in no distress Pulm: Nonlabored breathing room air. Clear. CV: Regular rate and rhythm. No murmur, rub, or gallop. No JVD, no dependent edema. GI: Abdomen soft, non-tender, non-distended, with normoactive bowel sounds.  Ext: Warm, dry. Diminished muscle bulk diffusely, infrapatellar bony deformities noted bilaterally. Decreased pulses bilateral DP pulses. Skin: No new rashes, lesions or ulcers on visualized skin. Neuro: Alert and oriented, unsteady on his feet. Decreased sensation in feet without acute focal neurological deficits. Psych: Judgement and insight appear marginal-fair. Mood euthymic & affect congruent. Behavior is appropriate  Data Personally reviewed: WBC 19 > 11 Hgb 12 > 9.8 Plt 113 > 74.  Creatinine 1.29 > 0.83 Albumin 3.2 LFTs wnl UA unremarkable CXR Emphysema, no infiltrate HIV NR; RPR negative.  Blood cultures 2/4: MSSA, susceptibilities pending   TTE 2/4: Normal biventricular function. Mild MR with hypermobility in submitral apparatus (said to be atypical for vegetation based on position). Mod AR with restricted left coronary cusp motion and moderate nodular calcification.   Orthopantogram 10/11/2021 Limited examination due to patient motion demonstrating diffusely poor dentition with multiple tooth extractions, dental caries, and periapical lucencies suggesting periodontal disease. SARS-CoV-2 PCR (2/3): Negative Influenza A/B: Negative  Family Communication: None at bedside  Disposition: Status is: Inpatient Remains inpatient appropriate because: For continued IV antibiotics and work up  of MSSA bacteremia Planned Discharge Destination: TBD  Patrecia Pour, MD 10/13/2021 10:11 AM [email or web address]

## 2021-10-13 NOTE — Progress Notes (Signed)
The patient requested sleep medication; Dr. Hal Hope ordered Melatonin.  Will administer and continue to monitor patient.

## 2021-10-14 DIAGNOSIS — N400 Enlarged prostate without lower urinary tract symptoms: Secondary | ICD-10-CM | POA: Diagnosis not present

## 2021-10-14 DIAGNOSIS — C210 Malignant neoplasm of anus, unspecified: Secondary | ICD-10-CM | POA: Diagnosis not present

## 2021-10-14 DIAGNOSIS — N179 Acute kidney failure, unspecified: Secondary | ICD-10-CM | POA: Diagnosis not present

## 2021-10-14 DIAGNOSIS — R7881 Bacteremia: Secondary | ICD-10-CM | POA: Diagnosis not present

## 2021-10-14 DIAGNOSIS — B9561 Methicillin susceptible Staphylococcus aureus infection as the cause of diseases classified elsewhere: Secondary | ICD-10-CM | POA: Diagnosis not present

## 2021-10-14 LAB — CULTURE, BLOOD (ROUTINE X 2)
Special Requests: ADEQUATE
Special Requests: ADEQUATE

## 2021-10-14 NOTE — Progress Notes (Signed)
[facility] for Infectious Disease   Reason for visit: Follow up on bacteremia  Interval History: WBC wnl yesterday, remains afebrile since his initial fever on admission.  No new complaints.  No associated rash or diarrhea.  Day 4 antibiotics Day 3 cefazolin  Physical Exam: Constitutional:  Vitals:   10/14/21 0739 10/14/21 1233  BP: (!) 128/57 115/61  Pulse: (!) 57 (!) 58  Resp: 16 11  Temp: 97.7 F (36.5 C) 98.4 F (36.9 C)  SpO2: 93% 94%   patient appears in NAD; sleeping but arousable Respiratory: Normal respiratory effort; CTA B Cardiovascular: RRR GI: soft, nt, nd  Review of Systems: Constitutional: negative for fevers and chills Gastrointestinal: negative for nausea and diarrhea  Lab Results  Component Value Date   WBC 7.1 10/13/2021   HGB 9.8 (L) 10/13/2021   HCT 29.9 (L) 10/13/2021   MCV 91.7 10/13/2021   PLT 68 (L) 10/13/2021    Lab Results  Component Value Date   CREATININE 0.78 10/13/2021   BUN 11 10/13/2021   NA 135 10/13/2021   K 3.5 10/13/2021   CL 102 10/13/2021   CO2 23 10/13/2021    Lab Results  Component Value Date   ALT 15 10/11/2021   AST 26 10/11/2021   ALKPHOS 84 10/11/2021     Microbiology: Recent Results (from the past 240 hour(s))  Resp Panel by RT-PCR (Flu A&B, Covid)     Status: None   Collection Time: 10/11/21  3:23 PM   Specimen: Nasopharyngeal(NP) swabs in vial transport medium  Result Value Ref Range Status   SARS Coronavirus 2 by RT PCR NEGATIVE NEGATIVE Final    Comment: (NOTE) SARS-CoV-2 target nucleic acids are NOT DETECTED.  The SARS-CoV-2 RNA is generally detectable in upper respiratory specimens during the acute phase of infection. The lowest concentration of SARS-CoV-2 viral copies this assay can detect is 138 copies/mL. A negative result does not preclude SARS-Cov-2 infection and should not be used as the sole basis for treatment or other patient management decisions. A negative result may occur with   improper specimen collection/handling, submission of specimen other than nasopharyngeal swab, presence of viral mutation(s) within the areas targeted by this assay, and inadequate number of viral copies(<138 copies/mL). A negative result must be combined with clinical observations, patient history, and epidemiological information. The expected result is Negative.  Fact Sheet for Patients:  EntrepreneurPulse.com.au  Fact Sheet for Healthcare Providers:  IncredibleEmployment.be  This test is no t yet approved or cleared by the [place] FDA and  has been authorized for detection and/or diagnosis of SARS-CoV-2 by FDA under an Emergency Use Authorization (EUA). This EUA will remain  in effect (meaning this test can be used) for the duration of the COVID-19 declaration under Section 564(b)(1) of the Act, 21 U.S.C.section 360bbb-3(b)(1), unless the authorization is terminated  or revoked sooner.       Influenza A by PCR NEGATIVE NEGATIVE Final   Influenza B by PCR NEGATIVE NEGATIVE Final    Comment: (NOTE) The Xpert Xpress SARS-CoV-2/FLU/RSV plus assay is intended as an aid in the diagnosis of influenza from Nasopharyngeal swab specimens and should not be used as a sole basis for treatment. Nasal washings and aspirates are unacceptable for Xpert Xpress SARS-CoV-2/FLU/RSV testing.  Fact Sheet for Patients: EntrepreneurPulse.com.au  Fact Sheet for Healthcare Providers: IncredibleEmployment.be  This test is not yet approved or cleared by the [place] FDA and has been authorized for detection and/or diagnosis of SARS-CoV-2 by FDA  under an Emergency Use Authorization (EUA). This EUA will remain in effect (meaning this test can be used) for the duration of the COVID-19 declaration under Section 564(b)(1) of the Act, 21 U.S.C. section 360bbb-3(b)(1), unless the authorization is terminated  or revoked.  Performed at [facility], [place] [address], [place], [place] [address]   Culture, blood (routine x 2)     Status: Abnormal   Collection Time: 10/11/21  3:30 PM   Specimen: BLOOD LEFT HAND  Result Value Ref Range Status   Specimen Description BLOOD LEFT HAND  Final   Special Requests   Final    BOTTLES DRAWN AEROBIC AND ANAEROBIC Blood Culture adequate volume   Culture  Setup Time   Final    GRAM POSITIVE COCCI IN CLUSTERS IN BOTH AEROBIC AND ANAEROBIC BOTTLES CRITICAL RESULT CALLED TO, READ BACK BY AND VERIFIED WITH: V BRYK,PHARMD@[id number]  10/12/21 Echo Performed at [facility], [place] [address], [place], [place] [address]    Culture STAPHYLOCOCCUS AUREUS (A)  Final   Report Status 10/14/2021 FINAL  Final   Organism ID, Bacteria STAPHYLOCOCCUS AUREUS  Final      Susceptibility   Staphylococcus aureus - MIC*    CIPROFLOXACIN <=0.5 SENSITIVE Sensitive     ERYTHROMYCIN <=0.25 SENSITIVE Sensitive     GENTAMICIN <=0.5 SENSITIVE Sensitive     OXACILLIN <=0.25 SENSITIVE Sensitive     TETRACYCLINE <=1 SENSITIVE Sensitive     VANCOMYCIN 1 SENSITIVE Sensitive     TRIMETH/SULFA <=10 SENSITIVE Sensitive     CLINDAMYCIN <=0.25 SENSITIVE Sensitive     RIFAMPIN <=0.5 SENSITIVE Sensitive     Inducible Clindamycin NEGATIVE Sensitive     * STAPHYLOCOCCUS AUREUS  Blood Culture ID Panel (Reflexed)     Status: Abnormal   Collection Time: 10/11/21  3:30 PM  Result Value Ref Range Status   Enterococcus faecalis NOT DETECTED NOT DETECTED Final   Enterococcus Faecium NOT DETECTED NOT DETECTED Final   Listeria monocytogenes NOT DETECTED NOT DETECTED Final   Staphylococcus species DETECTED (A) NOT DETECTED Final    Comment: CRITICAL RESULT CALLED TO, READ BACK BY AND VERIFIED WITH: V BRYK,PHARMD@0523  10/12/21 [place]    Staphylococcus aureus (BCID) DETECTED (A) NOT DETECTED Final    Comment: CRITICAL RESULT CALLED TO, READ BACK BY AND VERIFIED WITH: V BRYK,PHARMD@0523  10/12/21 [place]     Staphylococcus epidermidis NOT DETECTED NOT DETECTED Final   Staphylococcus lugdunensis NOT DETECTED NOT DETECTED Final   Streptococcus species NOT DETECTED NOT DETECTED Final   Streptococcus agalactiae NOT DETECTED NOT DETECTED Final   Streptococcus pneumoniae NOT DETECTED NOT DETECTED Final   Streptococcus pyogenes NOT DETECTED NOT DETECTED Final   A.calcoaceticus-baumannii NOT DETECTED NOT DETECTED Final   Bacteroides fragilis NOT DETECTED NOT DETECTED Final   Enterobacterales NOT DETECTED NOT DETECTED Final   Enterobacter cloacae complex NOT DETECTED NOT DETECTED Final   Escherichia coli NOT DETECTED NOT DETECTED Final   Klebsiella aerogenes NOT DETECTED NOT DETECTED Final   Klebsiella oxytoca NOT DETECTED NOT DETECTED Final   Klebsiella pneumoniae NOT DETECTED NOT DETECTED Final   Proteus species NOT DETECTED NOT DETECTED Final   Salmonella species NOT DETECTED NOT DETECTED Final   Serratia marcescens NOT DETECTED NOT DETECTED Final   Haemophilus influenzae NOT DETECTED NOT DETECTED Final   Neisseria meningitidis NOT DETECTED NOT DETECTED Final   Pseudomonas aeruginosa NOT DETECTED NOT DETECTED Final   Stenotrophomonas maltophilia NOT DETECTED NOT DETECTED Final   Candida albicans NOT DETECTED NOT DETECTED Final  Candida auris NOT DETECTED NOT DETECTED Final   Candida glabrata NOT DETECTED NOT DETECTED Final   Candida krusei NOT DETECTED NOT DETECTED Final   Candida parapsilosis NOT DETECTED NOT DETECTED Final   Candida tropicalis NOT DETECTED NOT DETECTED Final   Cryptococcus neoformans/gattii NOT DETECTED NOT DETECTED Final   Meth resistant mecA/C and MREJ NOT DETECTED NOT DETECTED Final    Comment: Performed at [facility], [place] [address] [place], [place] [address]  Culture, blood (routine x 2)     Status: Abnormal   Collection Time: 10/11/21  3:35 PM   Specimen: BLOOD LEFT FOREARM  Result Value Ref Range Status   Specimen Description BLOOD LEFT FOREARM  Final    Special Requests   Final    BOTTLES DRAWN AEROBIC AND ANAEROBIC Blood Culture adequate volume   Culture  Setup Time   Final    GRAM POSITIVE COCCI IN CLUSTERS IN BOTH AEROBIC AND ANAEROBIC BOTTLES CRITICAL VALUE NOTED.  VALUE IS CONSISTENT WITH PREVIOUSLY REPORTED AND CALLED VALUE.    Culture (A)  Final    STAPHYLOCOCCUS AUREUS SUSCEPTIBILITIES PERFORMED ON PREVIOUS CULTURE WITHIN THE LAST 5 DAYS. Performed at [facility], [place] [address], [place], [place] [address]    Report Status 10/14/2021 FINAL  Final  Culture, blood (routine x 2)     Status: None (Preliminary result)   Collection Time: 10/12/21  2:42 PM   Specimen: BLOOD RIGHT ARM  Result Value Ref Range Status   Specimen Description BLOOD RIGHT ARM  Final   Special Requests   Final    BOTTLES DRAWN AEROBIC ONLY Blood Culture results may not be optimal due to an inadequate volume of blood received in culture bottles   Culture   Final    NO GROWTH 2 DAYS Performed at [facility], [place] [address] [place], [place] [address]    Report Status PENDING  Incomplete  Culture, blood (routine x 2)     Status: None (Preliminary result)   Collection Time: 10/12/21  2:55 PM   Specimen: BLOOD RIGHT HAND  Result Value Ref Range Status   Specimen Description BLOOD RIGHT HAND  Final   Special Requests   Final    BOTTLES DRAWN AEROBIC AND ANAEROBIC Blood Culture adequate volume   Culture   Final    NO GROWTH 2 DAYS Performed at [facility], [place] [address], [place], [place] [address]    Report Status PENDING  Incomplete    Impression/Plan:  1. Mssa bacteremia - he is on cefazolin and repeat blood cultures sent. TTE without obvious vegetation and TEE scheduled for tomorrow.  Continue with cefazolin If TEE negative, will not need a prolonged IV course  2.  Aki - creat now wnl with hydration.    3.  IVDU - counseled.  Will not be a candidate for home IV therapy.

## 2021-10-14 NOTE — Plan of Care (Signed)
°  Problem: Fluid Volume: Goal: Hemodynamic stability will improve Outcome: Progressing   Problem: Clinical Measurements: Goal: Diagnostic test results will improve Outcome: Progressing Goal: Signs and symptoms of infection will decrease Outcome: Progressing   Problem: Respiratory: Goal: Ability to maintain adequate ventilation will improve Outcome: Progressing

## 2021-10-14 NOTE — Progress Notes (Signed)
°  Transition of Care [facility]) Screening Note   Patient Details  Name: Martin [name] Date of Birth: [birth year]   Transition of Care [facility]) CM/SW Contact:    Benard Halsted, LCSW Phone Number: 10/14/2021, 9:02 AM    Transition of Care Department [facility]) has reviewed patient. We will continue to monitor patient advancement through interdisciplinary progression rounds. If new patient transition needs arise, please place a TOC consult.

## 2021-10-14 NOTE — Evaluation (Signed)
Physical Therapy Evaluation Patient Details Name: Martin [name] MRN: [id number] DOB: [birth year] Today's Date: 10/14/2021  History of Present Illness  pt is a 61 y/o male admitted2/3 with 2 days of worsening fatigue, generalized weakness, N/V and lightheadedness.  Work up found MSSA bacteremia.  PMHx:  Anal CA, DJD, HTN, PVD  Clinical Impression  Pt admitted with/for the problems stated above found to be in part due to MSSA bacteremia.  Pt needing min to light moderate assist for mobility OOB..  Pt currently limited functionally due to the problems listed below.  (see problems list.)  Pt will benefit from PT to maximize function and safety to be able to get home safely with available assist.        Recommendations for follow up therapy are one component of a multi-disciplinary discharge planning process, led by the attending physician.  Recommendations may be updated based on patient status, additional functional criteria and insurance authorization.  Follow Up Recommendations Home health PT    Assistance Recommended at Discharge Intermittent Supervision/Assistance  Patient can return home with the following  A little help with walking and/or transfers;Assist for transportation;Help with stairs or ramp for entrance;Assistance with cooking/housework    Equipment Recommendations Rolling walker (2 wheels)  Recommendations for Other Services       Functional Status Assessment       Precautions / Restrictions Precautions Precautions: Fall      Mobility  Bed Mobility Overal bed mobility: Modified Independent                  Transfers Overall transfer level: Needs assistance   Transfers: Sit to/from Stand Sit to Stand: Min guard           General transfer comment: unsteady during ascent and in stance    Ambulation/Gait Ambulation/Gait assistance: Min assist, Mod assist (periods where mod needed to regain balance.) Gait Distance (Feet): 400 Feet Assistive device:  Rolling walker (2 wheels), None Gait Pattern/deviations: Step-through pattern Gait velocity: able to change speed, but generally slower. Gait velocity interpretation: <1.8 ft/sec, indicate of risk for recurrent falls   General Gait Details: unsteady throughout with bias posterior and to the R, drift, mild stagger trying to recover balance.  Too unsteady without RW.  Needed stability assist throughout in addition to the RW  Stairs            Wheelchair Mobility    Modified Rankin (Stroke Patients Only)       Balance Overall balance assessment: Needs assistance Sitting-balance support: No upper extremity supported, Feet supported Sitting balance-Leahy Scale: Good     Standing balance support: Single extremity supported, Bilateral upper extremity supported, During functional activity Standing balance-Leahy Scale: Poor Standing balance comment: bias posterior and R without external assist                             Pertinent Vitals/Pain Pain Assessment Pain Assessment: Faces Faces Pain Scale: Hurts a little bit Pain Location: toes R>L Pain Descriptors / Indicators: Sore (stiff) Pain Intervention(s): Monitored during session    Home Living Family/patient expects to be discharged to:: Private residence Living Arrangements: Children;Other relatives Available Help at Discharge: Family;Available 24 hours/day Type of Home: Mobile home Home Access: Stairs to enter Entrance Stairs-Rails: Right;Left Entrance Stairs-Number of Steps: 5   Home Layout: One level Home Equipment: None      Prior Function Prior Level of Function : Independent/Modified Independent;Driving  Hand Dominance   Dominant Hand: Right    Extremity/Trunk Assessment   Upper Extremity Assessment Upper Extremity Assessment: Overall WFL for tasks assessed    Lower Extremity Assessment Lower Extremity Assessment: Generalized weakness;Overall [facility] for tasks  assessed    Cervical / Trunk Assessment Cervical / Trunk Assessment: Normal  Communication   Communication: No difficulties  Cognition Arousal/Alertness: Awake/alert Behavior During Therapy: WFL for tasks assessed/performed Overall Cognitive Status: Within Functional Limits for tasks assessed                                          General Comments General comments (skin integrity, edema, etc.): vss on RA    Exercises     Assessment/Plan    PT Assessment Patient needs continued PT services  PT Problem List Decreased strength;Decreased balance;Decreased mobility;Decreased knowledge of use of DME       PT Treatment Interventions DME instruction;Gait training;Stair training;Functional mobility training;Therapeutic activities;Balance training;Patient/family education    PT Goals (Current goals can be found in the Care Plan section)  Acute Rehab PT Goals Patient Stated Goal: back to independent level. PT Goal Formulation: With patient Time For Goal Achievement: 10/28/21 Potential to Achieve Goals: Good    Frequency Min 3X/week     Co-evaluation               AM-PAC PT "6 Clicks" Mobility  Outcome Measure Help needed turning from your back to your side while in a flat bed without using bedrails?: None Help needed moving from lying on your back to sitting on the side of a flat bed without using bedrails?: None Help needed moving to and from a bed to a chair (including a wheelchair)?: A Little Help needed standing up from a chair using your arms (e.g., wheelchair or bedside chair)?: A Little Help needed to walk in hospital room?: A Lot Help needed climbing 3-5 steps with a railing? : A Little 6 Click Score: 19    End of Session   Activity Tolerance: Patient tolerated treatment well Patient left: in bed;with call bell/phone within reach;with bed alarm set Nurse Communication: Mobility status PT Visit Diagnosis: Unsteadiness on feet  (R26.81);Difficulty in walking, not elsewhere classified (R26.2)    Time: 7253-6644 PT Time Calculation (min) (ACUTE ONLY): 22 min   Charges:   PT Evaluation $PT Eval Moderate Complexity: 1 Mod          10/14/2021  Ginger Carne., PT Acute Rehabilitation Services [phone]  (pager) [phone]  (office)  Tessie Fass Sherolyn Trettin 10/14/2021, 5:41 PM

## 2021-10-14 NOTE — TOC Initial Note (Signed)
Transition of Care [facility]) - Initial/Assessment Note    Patient Details  Name: Martin [name] MRN: [id number] Date of Birth: 1960/09/24  Transition of Care [facility]) CM/SW Contact:    Benard Halsted, LCSW Phone Number: 10/14/2021, 2:20 PM  Clinical Narrative:                 CSW received request for SNF placement for IV antibiotics. Referrals sent out, however, current barriers include substance use and Methadone.   Expected Discharge Plan: Home/Self Care Barriers to Discharge: Continued Medical Work up, Active Substance Use with PICC Line   Patient Goals and CMS Choice     Choice offered to / list presented to : Patient  Expected Discharge Plan and Services Expected Discharge Plan: Home/Self Care In-house Referral: Clinical Social Work     Living arrangements for the past 2 months: Single Family Home                                      Prior Living Arrangements/Services Living arrangements for the past 2 months: Single Family Home   Patient language and need for interpreter reviewed:: Yes Do you feel safe going back to the place where you live?: Yes      Need for Family Participation in Patient Care: Yes (Comment) Care giver support system in place?: Yes (comment)   Criminal Activity/Legal Involvement Pertinent to Current Situation/Hospitalization: No - Comment as needed  Activities of Daily Living Home Assistive Devices/Equipment: None ADL Screening (condition at time of admission) Patient's cognitive ability adequate to safely complete daily activities?: Yes Is the patient deaf or have difficulty hearing?: No Does the patient have difficulty seeing, even when wearing glasses/contacts?: No Does the patient have difficulty concentrating, remembering, or making decisions?: No Patient able to express need for assistance with ADLs?: Yes Does the patient have difficulty dressing or bathing?: No Independently performs ADLs?: Yes (appropriate for developmental  age) Does the patient have difficulty walking or climbing stairs?: No Weakness of Legs: Both Weakness of Arms/Hands: Both  Permission Sought/Granted Permission sought to share information with : Facility Art therapist granted to share information with : Yes, Verbal Permission Granted     Permission granted to share info w AGENCY: SNFs        Emotional Assessment Appearance:: Appears stated age     Orientation: : Oriented to Self, Oriented to Place, Oriented to  Time, Oriented to Situation Alcohol / Substance Use: Illicit Drugs Psych Involvement: No (comment)  Admission diagnosis:  Periodontal disease [K05.6] Elevated troponin [R77.8] Sepsis ([place]) [A41.9] Sepsis, due to unspecified organism, unspecified whether acute organ dysfunction present [facility]) [A41.9] Patient Active Problem List   Diagnosis Date Noted   Sepsis 10/12/2021   Peripheral vascular disease    AKI    Tobacco use    Emphysema    BPH     Periodontal disease    IVDU, cocaine use    Demand ischemia    Thrombocytopenia    Normocytic anemia    MSSA bacteremia 10/11/2021   Callus 06/12/2021   Hav (hallux abducto valgus), unspecified laterality 06/12/2021   Hammer toes, bilateral 06/12/2021   Atherosclerosis of native arteries of extremity with intermittent claudication ([place]) 06/12/2021   Hyperlipidemia 06/07/2021   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   Elevated blood-pressure reading, without diagnosis of hypertension 09/26/2020   Body mass index (BMI) 29.0-29.9, adult 08/29/2020   Partial  small bowel obstruction ([place]) 03/10/2020   Small bowel obstruction due to adhesions (H[facility]) 03/08/2020   Enteritis 10/15/2019   Abnormal liver function 10/15/2019   Leukocytosis 10/15/2019   Nausea & vomiting 10/15/2019   GAD (generalized anxiety disorder) 05/18/2017   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   Constipation 04/11/2017   Bee sting 04/05/2017   Diverticulosis 11/29/2014    Healthcare maintenance 11/06/2014   Cough 10/05/2014   Insomnia 09/13/2014   Erectile dysfunction 11/30/2013   Chronic pain syndrome 11/30/2013   Depression 11/30/2013   GERD (gastroesophageal reflux disease)    Hypertension    Anxiety    Arthritis    Osteoarthritis of lumbar spine    Radiation    Night sweats 08/12/2012   Tobacco abuse counseling 05/26/2012   Lymphocytic colitis 05/03/2012   Vitamin B12 deficiency 03/19/2012   Folate deficiency 03/19/2012   Inguinal hernia 11/24/2011   Cancer ([place]) 10/14/2011   Internal hemorrhoid 09/11/2011   Anal cancer 10/09/2010   PCP:  Camillia Herter, NP Pharmacy:   [facility] [place], [place] [address] Phone: [phone] Fax: [facility] [place] [address] Phone: [phone] Fax: [phone]  [place] [place] [address] Phone: [phone] Fax: [phone]     Social Determinants of Health (SDOH) Interventions    Readmission Risk Interventions No flowsheet data found.

## 2021-10-14 NOTE — NC FL2 (Signed)
[place] LEVEL OF CARE SCREENING TOOL     IDENTIFICATION  Patient Name: LAVAN IMES Birthdate: 07-24-1961 Sex: male Admission Date (Current Location): 10/11/2021  [facility] [place] Number:  Herbalist and Address:  [facility], [place] [address] [place], [place] [address]      Provider Number: [id number]  Attending Physician Name and Address:  Patrecia Pour, MD  Relative Name and Phone Number:       Current Level of Care: Hospital Recommended Level of Care: [place] Prior Approval Number:    Date Approved/Denied:   PASRR Number: [id number]  Discharge Plan: SNF    Current Diagnoses: Patient Active Problem List   Diagnosis Date Noted   Sepsis 10/12/2021   Peripheral vascular disease    AKI    Tobacco use    Emphysema    BPH     Periodontal disease    IVDU, cocaine use    Demand ischemia    Thrombocytopenia    Normocytic anemia    MSSA bacteremia 10/11/2021   Callus 06/12/2021   Hav (hallux abducto valgus), unspecified laterality 06/12/2021   Hammer toes, bilateral 06/12/2021   Atherosclerosis of native arteries of extremity with intermittent claudication ([place]) 06/12/2021   Hyperlipidemia 06/07/2021   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   Elevated blood-pressure reading, without diagnosis of hypertension 09/26/2020   Body mass index (BMI) 29.0-29.9, adult 08/29/2020   Partial small bowel obstruction ([place]) 03/10/2020   Small bowel obstruction due to adhesions ([place]) 03/08/2020   Enteritis 10/15/2019   Abnormal liver function 10/15/2019   Leukocytosis 10/15/2019   Nausea & vomiting 10/15/2019   GAD (generalized anxiety disorder) 05/18/2017   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   Constipation 04/11/2017   Bee sting 04/05/2017   Diverticulosis 11/29/2014   Healthcare maintenance 11/06/2014   Cough 10/05/2014   Insomnia 09/13/2014   Erectile dysfunction 11/30/2013   Chronic pain  syndrome 11/30/2013   Depression 11/30/2013   GERD (gastroesophageal reflux disease)    Hypertension    Anxiety    Arthritis    Osteoarthritis of lumbar spine    Radiation    Night sweats 08/12/2012   Tobacco abuse counseling 05/26/2012   Lymphocytic colitis 05/03/2012   Vitamin B12 deficiency 03/19/2012   Folate deficiency 03/19/2012   Inguinal hernia 11/24/2011   Cancer ([place]) 10/14/2011   Internal hemorrhoid 09/11/2011   Anal cancer 10/09/2010    Orientation RESPIRATION BLADDER Height & Weight     Self, Time, Situation, Place  Normal Continent Weight: 138 lb 3.7 oz (62.7 kg) Height:  5' 11"  (180.3 cm)  BEHAVIORAL SYMPTOMS/MOOD NEUROLOGICAL BOWEL NUTRITION STATUS      Continent Diet (see dc summary)  AMBULATORY STATUS COMMUNICATION OF NEEDS Skin   Independent Verbally Normal                       Personal Care Assistance Level of Assistance  Bathing, Feeding, Dressing Bathing Assistance: Limited assistance Feeding assistance: Independent Dressing Assistance: Limited assistance     Functional Limitations Info             SPECIAL CARE FACTORS FREQUENCY   (Requires 4-6 weeks IV antibiotics)                    Contractures Contractures Info: Not present    Additional Factors Info  Code Status, Allergies Code Status Info: Full Allergies Info: [place] Venom (Bee Venom), Aspirin,  Codeine, Ibuprofen, Morphine And Related           Current Medications (10/14/2021):  This is the current hospital active medication list Current Facility-Administered Medications  Medication Dose Route Frequency Provider Last Rate Last Admin   acetaminophen (TYLENOL) tablet 650 mg  650 mg Oral Q6H PRN Patrecia Pour, MD   650 mg at 10/13/21 0157   Or   acetaminophen (TYLENOL) suppository 650 mg  650 mg Rectal Q6H PRN Patrecia Pour, MD       albuterol (PROVENTIL) (2.5 MG/3ML) 0.083% nebulizer solution 3 mL  3 mL Inhalation Q6H PRN Patrecia Pour, MD       alum & mag  hydroxide-simeth (MAALOX/MYLANTA) 200-200-20 MG/5ML suspension 15 mL  15 mL Oral Q6H PRN Patrecia Pour, MD   15 mL at 10/12/21 2306   apixaban (ELIQUIS) tablet 2.5 mg  2.5 mg Oral BID Vance Gather B, MD   2.5 mg at 10/14/21 4132   ceFAZolin (ANCEF) IVPB 2g/100 mL premix  2 g Intravenous Q8H Vance Gather B, MD 200 mL/hr at 10/14/21 4401 2 g at 10/14/21 0272   clopidogrel (PLAVIX) tablet 75 mg  75 mg Oral Daily Patrecia Pour, MD   75 mg at 10/14/21 5366   cyanocobalamin ((VITAMIN B-12)) injection 1,000 mcg  1,000 mcg Intramuscular Daily Vance Gather B, MD   1,000 mcg at 10/14/21 4403   Followed by   Derrill Memo ON 10/20/2021] cyanocobalamin ((VITAMIN B-12)) injection 1,000 mcg  1,000 mcg Intramuscular Weekly Patrecia Pour, MD       Followed by   Derrill Memo ON 11/17/2021] cyanocobalamin ((VITAMIN B-12)) injection 1,000 mcg  1,000 mcg Intramuscular Q30 days Patrecia Pour, MD       folic acid (FOLVITE) tablet 1 mg  1 mg Oral Daily Vance Gather B, MD   1 mg at 10/14/21 0951   hydrALAZINE (APRESOLINE) tablet 25 mg  25 mg Oral Q6H PRN Patrecia Pour, MD       LORazepam (ATIVAN) tablet 1-4 mg  1-4 mg Oral Q1H PRN Rise Patience, MD       Or   LORazepam (ATIVAN) injection 1-4 mg  1-4 mg Intravenous Q1H PRN Rise Patience, MD       LORazepam (ATIVAN) tablet 0-4 mg  0-4 mg Oral Q6H Rise Patience, MD   3 mg at 10/13/21 4742   Followed by   Derrill Memo ON 10/15/2021] LORazepam (ATIVAN) tablet 0-4 mg  0-4 mg Oral Q12H Rise Patience, MD       methadone (DOLOPHINE) tablet 115 mg  115 mg Oral Daily Patrecia Pour, MD   115 mg at 10/14/21 0950   multivitamin with minerals tablet 1 tablet  1 tablet Oral Daily Vance Gather B, MD   1 tablet at 10/14/21 0951   nicotine (NICODERM CQ - dosed in mg/24 hours) patch 21 mg  21 mg Transdermal Daily Vance Gather B, MD   21 mg at 10/14/21 0951   ondansetron (ZOFRAN) tablet 4 mg  4 mg Oral Q6H PRN Patrecia Pour, MD   4 mg at 10/13/21 0305   Or   ondansetron (ZOFRAN) injection  4 mg  4 mg Intravenous Q6H PRN Vance Gather B, MD   4 mg at 10/11/21 2059   sodium chloride flush (NS) 0.9 % injection 3 mL  3 mL Intravenous Q12H Patrecia Pour, MD   3 mL at 10/14/21 0952   tamsulosin (FLOMAX) capsule 0.4 mg  0.4 mg Oral QHS Vance Gather B, MD   0.4 mg at 10/13/21 2246   thiamine tablet 100 mg  100 mg Oral Daily Patrecia Pour, MD   100 mg at 10/14/21 0951   traMADol (ULTRAM) tablet 50 mg  50 mg Oral Q6H PRN Patrecia Pour, MD   50 mg at 10/12/21 2307     Discharge Medications: Please see discharge summary for a list of discharge medications.  Relevant Imaging Results:  Relevant Lab Results:   Additional Information SSN: [id number]. Requires IV Ancef 4-6 weeks  Benard Halsted, LCSW

## 2021-10-14 NOTE — Progress Notes (Signed)
Progress Note  Patient: Martin [name] IRJ:[id number] DOB: 09/04/1961 DOA: 10/11/2021  DOS: 10/14/2021  LOS: 3 days   Brief hospital course: KHAALID LEFKOWITZ is a 61 y.o. 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 Labs indicated AKI (SCr 1.29) and demand myocardial ischemia (troponin 241 > 240 without angina) with leukocytosis (WBC 19k). Vancomycin and cefepime were started on admission, changed to ancef when blood cultures grew MSSA. Repeat blood cultures on 2/5 have remained negative. TTE was indeterminate for aortic valve vegetation. TEE is planned 2/8.  Assessment and Plan: * MSSA bacteremia- (present on admission) Likely due to cocaine IVDU.  - Converted to ancef 2g IV q8h. Pansensitivity is reported. ID auto-consulted. - Repeat blood cultures 2/5 NGTD x2 days - Echocardiogram cannot rule out AV vegetation. TEE will be performed 2/8.  - Implications on disposition, i.e. that he will require at least 4 weeks of IV antibiotics and is not eligible to be safely discharged home, have been shared with patient who currently has no objection with future attempts at facility placement pending clinical course/work up. CSW consulted.  Normocytic anemia- (present on admission) Stable. 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  Thrombocytopenia- (present on admission) Acute, worsened with IV fluids (all cell lines down). Suspect due to sepsis. Had this during 2013 as well. Stabilized > 50 k for now with immature fraction elevated.  - Monitor prior to TEE.  Demand ischemia- (present on admission) Troponin trend is flat, no chest pain, and no ischemic ECG changes, no WMA on echo. Suspect demand ischemia due to  sepsis.  - Restarted eliquis, antiplatelet. Not on beta blocker, won't start with +cocaine on UDS.  IVDU, cocaine use- (present on admission) Cessation counseling provided. HIV and RPR nonreactive, HepBAb negative. - Hepatitis C pending - This patient cannot be discharged to an unsupervised environment with PICC line.  Periodontal disease- (present on admission) No large abscess on orthopantogram. Needs significant excisions but not urgently.  BPH - (present on admission) - Continue tamsulosin  Emphysema- (present on admission) No wheezing.  - Continue prn albuterol  Tobacco use- (present on admission) - Cessation counseling provided, nicotine patch ordered.  AKI- (present on admission) Due to dehydration and sepsis. SCr 1.29 on admission, has returned to baseline 0.7-0.8.  Peripheral vascular disease- (present on admission) No critical limb ischemia noted.  - Continue eliquis (interestingly, on 2.28m BID per vascular surgery despite not meeting criteria for dose reduction, will not change dose at this time), plavix.  - Recommended he be on statin, though he is reluctant. Will not start at this time.   Sepsis- (present on admission) Due to MSSA bacteremia. Sepsis physiology resolving.  Chronic pain syndrome- (present on admission) Confirmed methadone dosing and alerted [facility] to patient's current admission.  - Continue methadone 1169mdaily chronic dose  Vitamin B12 deficiency- (present on admission) Likely causing an element of peripheral neuropathy, as this was noted in PMBertramConfirmed to be low here.  - IM supplementation daily x1 week, weekly x1 month, then monthly  Anal cancer- (present on admission) History of excision, chemotherapy, and radiation, diagnosed 2013.    Subjective: No overnight events noted. Feeling less generally ill. No fevers, no chest pain, no dyspnea.  Objective: Vitals:   10/14/21 0005 10/14/21 0320 10/14/21 0739 10/14/21 1233  BP:  (!) 117/57 (!) 144/76 (!) 128/57 115/61  Pulse: 62 70 (!) 57 (!) 58  Resp: 15 16 16 11   Temp: 97.7 F (36.5 C) 97.8 F (36.6 C) 97.7 F (36.5 C) 98.4 F (36.9 C)  TempSrc: Oral Axillary Axillary Oral  SpO2: 96% 95% 93% 94%  Weight:      Height:      Gen: Frail, chronically ill-appearing male in no distress Pulm: Nonlabored breathing room air. Clear. CV: Regular rate and rhythm. No murmur, rub, or gallop. No JVD, no dependent edema. GI: Abdomen soft, non-tender, non-distended, with normoactive bowel sounds.  Ext: Warm, no deformities Skin: No new rashes, lesions or ulcers on visualized skin. Neuro: Alert and oriented. No focal neurological deficits. Psych: Judgement and insight appear fair. Mood euthymic & affect congruent. Behavior is appropriate.    Data Personally reviewed: WBC 19 > 11 Hgb 12 > 9.8 Plt 113 > 74.  Creatinine 1.29 > 0.83 Albumin 3.2 LFTs wnl UA unremarkable CXR Emphysema, no infiltrate HIV NR; RPR negative.  Blood cultures 2/4: MSSA, susceptibilities pending   TTE 2/4: Normal biventricular function. Mild MR with hypermobility in submitral apparatus (said to be atypical for vegetation based on position). Mod AR with restricted left coronary cusp motion and moderate nodular calcification.   Orthopantogram 10/11/2021 Limited examination due to patient motion demonstrating diffusely poor dentition with multiple tooth extractions, dental caries, and periapical lucencies suggesting periodontal disease. SARS-CoV-2 PCR (2/3): Negative Influenza A/B: Negative  Family Communication: None at bedside  Disposition: Status is: Inpatient Remains inpatient appropriate because: For continued IV antibiotics and work up of MSSA bacteremia Planned Discharge Destination: TBD  Patrecia Pour, MD 10/14/2021 2:15 PM Page by [email or web address]

## 2021-10-14 NOTE — Care Management Important Message (Signed)
Important Message  Patient Details  Name: Martin [name] MRN: [id number] Date of Birth: Apr 19, 1961   Medicare Important Message Given:  Yes     Orbie Pyo 10/14/2021, 2:19 PM

## 2021-10-15 DIAGNOSIS — C210 Malignant neoplasm of anus, unspecified: Secondary | ICD-10-CM | POA: Diagnosis not present

## 2021-10-15 DIAGNOSIS — R7881 Bacteremia: Secondary | ICD-10-CM | POA: Diagnosis not present

## 2021-10-15 DIAGNOSIS — N179 Acute kidney failure, unspecified: Secondary | ICD-10-CM | POA: Diagnosis not present

## 2021-10-15 DIAGNOSIS — E538 Deficiency of other specified B group vitamins: Secondary | ICD-10-CM

## 2021-10-15 DIAGNOSIS — N400 Enlarged prostate without lower urinary tract symptoms: Secondary | ICD-10-CM | POA: Diagnosis not present

## 2021-10-15 NOTE — Anesthesia Preprocedure Evaluation (Addendum)
Anesthesia Evaluation  Patient identified by MRN, date of birth, ID band Patient awake    Reviewed: Allergy & Precautions, NPO status , Patient's Chart, lab work & pertinent test results  History of Anesthesia Complications Negative for: history of anesthetic complications  Airway Mallampati: II  TM Distance: >3 FB Neck ROM: Full    Dental  (+) Poor Dentition, Edentulous Upper,    Pulmonary COPD, Current Smoker and Patient abstained from smoking.,    Pulmonary exam normal        Cardiovascular hypertension, + Peripheral Vascular Disease  Normal cardiovascular exam     Neuro/Psych Anxiety Depression negative neurological ROS     GI/Hepatic GERD  ,(+)     substance abuse  cocaine use,   Endo/Other  negative endocrine ROS  Renal/GU ARFRenal disease  negative genitourinary   Musculoskeletal  (+) Arthritis , narcotic dependent  Abdominal   Peds  Hematology negative hematology ROS (+) Blood dyscrasia, anemia , Hgb 9.8, Plt 68k   Anesthesia Other Findings Bacteremia  Reproductive/Obstetrics negative OB ROS                            Anesthesia Physical Anesthesia Plan  ASA: 3  Anesthesia Plan: MAC   Post-op Pain Management: Minimal or no pain anticipated   Induction:   PONV Risk Score and Plan: 0 and Treatment may vary due to age or medical condition and Propofol infusion  Airway Management Planned: Natural Airway and Nasal Cannula  Additional Equipment: None  Intra-op Plan:   Post-operative Plan:   Informed Consent: I have reviewed the patients History and Physical, chart, labs and discussed the procedure including the risks, benefits and alternatives for the proposed anesthesia with the patient or authorized representative who has indicated his/her understanding and acceptance.       Plan Discussed with: CRNA  Anesthesia Plan Comments:        Anesthesia Quick  Evaluation

## 2021-10-15 NOTE — Progress Notes (Signed)
Progress Note  Patient: Martin [name] SEG:[id number] DOB: 28-Aug-1961 DOA: 10/11/2021  DOS: 10/15/2021  LOS: 4 days   Brief hospital course: Martin [name] is a 61 y.o. 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 Labs indicated AKI (SCr 1.29) and demand myocardial ischemia (troponin 241 > 240 without angina) with leukocytosis (WBC 19k). Vancomycin and cefepime were started on admission, changed to ancef when blood cultures grew MSSA. Repeat blood cultures on 2/5 have remained negative. TTE was indeterminate for aortic valve vegetation. TEE is planned 2/8.  Assessment and Plan: * MSSA bacteremia- (present on admission) Likely due to cocaine IVDU.  - Converted to ancef 2g IV q8h. Pansensitivity is reported. ID auto-consulted. - Repeat blood cultures 2/4 NGTD x3 days - Echocardiogram cannot rule out AV vegetation. TEE will be performed 2/8. Per ID, pt may not need protracted IV antibiotics if vegetation ruled out. Not a candidate for discharge home with PICC. CSW consulted in the event that we need to pursue placement.  Normocytic anemia- (present on admission) Stable. 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  Thrombocytopenia- (present on admission) Acute, worsened with IV fluids (all cell lines down). Suspect due to sepsis. Had this during 2013 as well. Stabilized > 50 k for now with immature fraction elevated.  - Monitor prior to TEE.  Demand ischemia- (present on admission) Troponin trend is flat, no chest pain, and no ischemic ECG changes, no WMA on echo. Suspect demand ischemia due to sepsis.  - Restarted eliquis, antiplatelet. Not on beta blocker, won't start with +cocaine on UDS.  IVDU,  cocaine use- (present on admission) Cessation counseling provided. HIV and RPR nonreactive, HepBAb negative. - Hepatitis C pending - This patient cannot be discharged to an unsupervised environment with PICC line.  Periodontal disease- (present on admission) No large abscess on orthopantogram. Needs significant excisions but not urgently.  BPH - (present on admission) - Continue tamsulosin  Emphysema- (present on admission) No wheezing.  - Continue prn albuterol  Tobacco use- (present on admission) - Cessation counseling provided, nicotine patch ordered.  AKI- (present on admission) Due to dehydration and sepsis. SCr 1.29 on admission, has returned to baseline 0.7-0.8.  Peripheral vascular disease- (present on admission) No critical limb ischemia noted.  - Continue eliquis (interestingly, on 2.45m BID per vascular surgery despite not meeting criteria for dose reduction, will not change dose at this time), plavix.  - Recommended he be on statin, though he is reluctant. Will not start at this time.   Sepsis- (present on admission) Due to MSSA bacteremia. Sepsis physiology resolving.  Chronic pain syndrome- (present on admission) Confirmed methadone dosing and alerted [facility] to patient's current admission.  - Continue methadone 1164mdaily chronic dose  Vitamin B12 deficiency- (present on admission) Likely causing an element of peripheral neuropathy, as this was noted in PMCabarrusConfirmed to be low here.  - IM supplementation daily x1 week, weekly x1 month, then monthly  Anal cancer- (present on admission) History of excision, chemotherapy, and radiation, diagnosed 2013.    Subjective: Feels generally better, moving around better, eating better, no fevers. No chest pain, dyspnea, palpitations.  Objective: Vitals:   10/15/21 0200 10/15/21 0351 10/15/21 0400 10/15/21 0945  BP: (!) 109/58 (!Marland Kitchen  141/62 135/84 (!) 122/57  Pulse: 67  64 77  Resp:  14 18 18   Temp:  97.7  F (36.5 C) 98.1 F (36.7 C) 98.6 F (37 C)  TempSrc:  Oral Oral Oral  SpO2:   100% 100%  Weight:      Height:      Gen: Chronically ill-appearing male in no distress HEENT: Very poor dentition Pulm: Nonlabored breathing room air. Clear. CV: Regular rate and rhythm. No murmur, rub, or gallop. No JVD, no dependent edema. GI: Abdomen soft, non-tender, non-distended, with normoactive bowel sounds.  Ext: Warm, no deformities Skin: No new rashes, lesions or ulcers on visualized skin. Neuro: Alert and oriented. No focal neurological deficits. Psych: Judgement and insight appear fair. Mood euthymic & affect congruent. Behavior is appropriate.    Data Personally reviewed: WBC 19 > 11 Hgb 12 > 9.8 Plt 113 > 74.  Creatinine 1.29 > 0.83 Albumin 3.2 LFTs wnl UA unremarkable CXR Emphysema, no infiltrate HIV NR; RPR negative.  Blood cultures 2/3: MSSA, pansensitive Blood cultures 2/4: NGTD TTE 2/4: Normal biventricular function. Mild MR with hypermobility in submitral apparatus (said to be atypical for vegetation based on position). Mod AR with restricted left coronary cusp motion and moderate nodular calcification.   Orthopantogram 10/11/2021 Limited examination due to patient motion demonstrating diffusely poor dentition with multiple tooth extractions, dental caries, and periapical lucencies suggesting periodontal disease. SARS-CoV-2 PCR (2/3): Negative Influenza A/B: Negative  Family Communication: None at bedside  Disposition: Status is: Inpatient Remains inpatient appropriate because: For continued IV antibiotics and work up of MSSA bacteremia Planned Discharge Destination: TBD  Patrecia Pour, MD 10/15/2021 12:25 PM Page by [email or web address]

## 2021-10-15 NOTE — Progress Notes (Signed)
° ° °  [facility] has been requested to perform a transesophageal echocardiogram on Martin [name] for Bacteremia.  After careful review of history and examination, the risks and benefits of transesophageal echocardiogram have been explained including risks of esophageal damage, perforation (1:10,000 risk), bleeding, pharyngeal hematoma as well as other potential complications associated with conscious sedation including aspiration, arrhythmia, respiratory failure and death. Alternatives to treatment were discussed, questions were answered. Patient is willing to proceed.  TEE - Dr. Debara Pickett @ 11am. NPO after midnight.   Leanor Kail, PA-C 10/15/2021 4:14 PM

## 2021-10-15 NOTE — Evaluation (Signed)
Occupational Therapy Evaluation Patient Details Name: Martin [name] MRN: [id number] DOB: 1960/10/09 Today's Date: 10/15/2021   History of Present Illness pt is a 61 y/o male admitted2/3 with 2 days of worsening fatigue, generalized weakness, N/V and lightheadedness.  Work up found MSSA bacteremia.  PMHx:  Anal CA, DJD, HTN, PVD   Clinical Impression   Pt presents with decline in function and safety with ADLs and ADL mobility with impaired balance and endurance. PTA pt lived at home with family members and was Ind with ADLs/selfcare, mobility and was driving. Pt currently requires min A - min guard A with LB ADLs and transfers and mobility using RW. Pt would benefit from acute OT services to address impairments to maximize level of function and safety     Recommendations for follow up therapy are one component of a multi-disciplinary discharge planning process, led by the attending physician.  Recommendations may be updated based on patient status, additional functional criteria and insurance authorization.   Follow Up Recommendations  Home health OT    Assistance Recommended at Discharge    Patient can return home with the following A little help with bathing/dressing/bathroom;A little help with walking and/or transfers    Functional Status Assessment  Patient has had a recent decline in their functional status and demonstrates the ability to make significant improvements in function in a reasonable and predictable amount of time.  Equipment Recommendations  Other (comment) (RW)    Recommendations for Other Services       Precautions / Restrictions Precautions Precautions: Fall Restrictions Weight Bearing Restrictions: No      Mobility Bed Mobility Overal bed mobility: Modified Independent                  Transfers Overall transfer level: Needs assistance   Transfers: Sit to/from Stand Sit to Stand: Min guard                  Balance Overall balance  assessment: Needs assistance Sitting-balance support: No upper extremity supported, Feet supported Sitting balance-Leahy Scale: Good     Standing balance support: Single extremity supported, Bilateral upper extremity supported, During functional activity Standing balance-Leahy Scale: Poor                             ADL either performed or assessed with clinical judgement   ADL Overall ADL's : Needs assistance/impaired Eating/Feeding: Independent;Sitting   Grooming: Wash/dry hands;Wash/dry face;Brushing hair;Min guard;Standing   Upper Body Bathing: Set up;Sitting   Lower Body Bathing: Min guard;Sit to/from stand   Upper Body Dressing : Set up;Sitting   Lower Body Dressing: Min guard;Sit to/from stand   Toilet Transfer: Min guard;Ambulation;Rolling walker (2 wheels);Cueing for Licensed conveyancer and Hygiene: Min guard;Sit to/from stand       Functional mobility during ADLs: Min guard       Vision Baseline Vision/History: 1 Wears glasses Ability to See in Adequate Light: 0 Adequate Patient Visual Report: No change from baseline       Perception     Praxis      Pertinent Vitals/Pain Pain Assessment Pain Assessment: Faces Faces Pain Scale: Hurts little more Pain Location: B feet Pain Descriptors / Indicators: Sore, Restless Pain Intervention(s): Monitored during session, Repositioned     Hand Dominance Right   Extremity/Trunk Assessment Upper Extremity Assessment Upper Extremity Assessment: Generalized weakness   Lower Extremity Assessment Lower Extremity Assessment: Defer to PT  evaluation   Cervical / Trunk Assessment Cervical / Trunk Assessment: Normal   Communication Communication Communication: No difficulties   Cognition Arousal/Alertness: Awake/alert Behavior During Therapy: WFL for tasks assessed/performed Overall Cognitive Status: Within Functional Limits for tasks assessed                                        General Comments       Exercises     Shoulder Instructions      Home Living Family/patient expects to be discharged to:: Private residence Living Arrangements: Children;Other relatives Available Help at Discharge: Family;Available 24 hours/day Type of Home: Mobile home Home Access: Stairs to enter Entrance Stairs-Number of Steps: 5 Entrance Stairs-Rails: Right;Left Home Layout: One level     Bathroom Shower/Tub: Occupational psychologist: Standard Bathroom Accessibility: Yes   Home Equipment: None          Prior Functioning/Environment Prior Level of Function : Independent/Modified Independent;Driving                        OT Problem List: Decreased activity tolerance;Impaired balance (sitting and/or standing)      OT Treatment/Interventions: Self-care/ADL training;Patient/family education;Therapeutic activities;Balance training;DME and/or AE instruction    OT Goals(Current goals can be found in the care plan section) Acute Rehab OT Goals Patient Stated Goal: go home OT Goal Formulation: With patient Time For Goal Achievement: 10/29/21 Potential to Achieve Goals: Good ADL Goals Pt Will Perform Grooming: with supervision;with set-up;with modified independence;standing Pt Will Perform Lower Body Bathing: with supervision;with set-up;with modified independence Pt Will Perform Lower Body Dressing: with supervision;with set-up;with modified independence Pt Will Transfer to Toilet: with supervision;with modified independence;ambulating Pt Will Perform Toileting - Clothing Manipulation and hygiene: with supervision;with modified independence;sit to/from stand  OT Frequency: Min 2X/week    Co-evaluation              AM-PAC OT "6 Clicks" Daily Activity     Outcome Measure Help from another person eating meals?: None Help from another person taking care of personal grooming?: A Little Help from another person  toileting, which includes using toliet, bedpan, or urinal?: A Little Help from another person bathing (including washing, rinsing, drying)?: A Little Help from another person to put on and taking off regular upper body clothing?: None Help from another person to put on and taking off regular lower body clothing?: A Little 6 Click Score: 20   End of Session Equipment Utilized During Treatment: Gait belt;Rolling walker (2 wheels)  Activity Tolerance: Patient tolerated treatment well Patient left: in bed;with call bell/phone within reach;with bed alarm set  OT Visit Diagnosis: Muscle weakness (generalized) (M62.81);Unsteadiness on feet (R26.81)                Time: 1000-1024 OT Time Calculation (min): 24 min Charges:  OT General Charges $OT Visit: 1 Visit OT Evaluation $OT Eval Moderate Complexity: 1 Mod OT Treatments $Self Care/Home Management : 8-22 mins    Emmit Alexanders [facility] 10/15/2021, 11:21 AM

## 2021-10-16 ENCOUNTER — Encounter (HOSPITAL_COMMUNITY)
Admission: EM | Disposition: A | Discharge status: Home Health | Payer: Self-pay | Source: Home / Self Care | Attending: Family Medicine

## 2021-10-16 ENCOUNTER — Encounter (HOSPITAL_COMMUNITY): Payer: Self-pay | Admitting: Family Medicine

## 2021-10-16 ENCOUNTER — Other Ambulatory Visit (HOSPITAL_COMMUNITY): Payer: Self-pay

## 2021-10-16 ENCOUNTER — Inpatient Hospital Stay (HOSPITAL_COMMUNITY): Payer: Medicare Other | Admitting: Anesthesiology

## 2021-10-16 ENCOUNTER — Inpatient Hospital Stay (HOSPITAL_COMMUNITY): Payer: Medicare Other

## 2021-10-16 DIAGNOSIS — C210 Malignant neoplasm of anus, unspecified: Secondary | ICD-10-CM | POA: Diagnosis not present

## 2021-10-16 DIAGNOSIS — R7881 Bacteremia: Secondary | ICD-10-CM

## 2021-10-16 DIAGNOSIS — Z20822 Contact with and (suspected) exposure to covid-19: Secondary | ICD-10-CM | POA: Diagnosis not present

## 2021-10-16 DIAGNOSIS — F418 Other specified anxiety disorders: Secondary | ICD-10-CM | POA: Diagnosis not present

## 2021-10-16 DIAGNOSIS — I340 Nonrheumatic mitral (valve) insufficiency: Secondary | ICD-10-CM

## 2021-10-16 DIAGNOSIS — B9561 Methicillin susceptible Staphylococcus aureus infection as the cause of diseases classified elsewhere: Secondary | ICD-10-CM | POA: Diagnosis not present

## 2021-10-16 DIAGNOSIS — I351 Nonrheumatic aortic (valve) insufficiency: Secondary | ICD-10-CM | POA: Diagnosis not present

## 2021-10-16 DIAGNOSIS — B1920 Unspecified viral hepatitis C without hepatic coma: Secondary | ICD-10-CM | POA: Diagnosis not present

## 2021-10-16 DIAGNOSIS — J439 Emphysema, unspecified: Secondary | ICD-10-CM | POA: Diagnosis not present

## 2021-10-16 DIAGNOSIS — E860 Dehydration: Secondary | ICD-10-CM | POA: Diagnosis not present

## 2021-10-16 DIAGNOSIS — A4101 Sepsis due to Methicillin susceptible Staphylococcus aureus: Secondary | ICD-10-CM | POA: Diagnosis not present

## 2021-10-16 DIAGNOSIS — N179 Acute kidney failure, unspecified: Secondary | ICD-10-CM | POA: Diagnosis not present

## 2021-10-16 DIAGNOSIS — I10 Essential (primary) hypertension: Secondary | ICD-10-CM | POA: Diagnosis not present

## 2021-10-16 DIAGNOSIS — I088 Other rheumatic multiple valve diseases: Secondary | ICD-10-CM | POA: Diagnosis not present

## 2021-10-16 DIAGNOSIS — D638 Anemia in other chronic diseases classified elsewhere: Secondary | ICD-10-CM | POA: Diagnosis not present

## 2021-10-16 DIAGNOSIS — D696 Thrombocytopenia, unspecified: Secondary | ICD-10-CM | POA: Diagnosis not present

## 2021-10-16 DIAGNOSIS — I739 Peripheral vascular disease, unspecified: Secondary | ICD-10-CM | POA: Diagnosis not present

## 2021-10-16 DIAGNOSIS — I248 Other forms of acute ischemic heart disease: Secondary | ICD-10-CM | POA: Diagnosis not present

## 2021-10-16 HISTORY — PX: TEE WITHOUT CARDIOVERSION: SHX5443

## 2021-10-16 LAB — BASIC METABOLIC PANEL
Anion gap: 9 (ref 5–15)
BUN: 11 mg/dL (ref 8–23)
CO2: 25 mmol/L (ref 22–32)
Calcium: 8.4 mg/dL — ABNORMAL LOW (ref 8.9–10.3)
Chloride: 99 mmol/L (ref 98–111)
Creatinine, Ser: 0.78 mg/dL (ref 0.61–1.24)
GFR, Estimated: >60 mL/min (ref >60)
Glucose, Bld: 123 mg/dL — ABNORMAL HIGH (ref 70–99)
Potassium: 3.9 mmol/L (ref 3.5–5.1)
Sodium: 133 mmol/L — ABNORMAL LOW (ref 135–145)

## 2021-10-16 LAB — HCV RT-PCR, QUANT (NON-GRAPH): Hepatitis C Quantitation: NOT DETECTED IU/mL

## 2021-10-16 LAB — ECHO TEE: P 1/2 time: 354 msec

## 2021-10-16 LAB — HCV AB W REFLEX TO QUANT PCR: HCV Ab: >11 s/co ratio — ABNORMAL HIGH (ref 0.0–0.9)

## 2021-10-16 LAB — CBC
HCT: 30.3 % — ABNORMAL LOW (ref 39.0–52.0)
Hemoglobin: 10.3 g/dL — ABNORMAL LOW (ref 13.0–17.0)
MCH: 29.8 pg (ref 26.0–34.0)
MCHC: 34 g/dL (ref 30.0–36.0)
MCV: 87.6 fL (ref 80.0–100.0)
Platelets: 107 10*3/uL — ABNORMAL LOW (ref 150–400)
RBC: 3.46 MIL/uL — ABNORMAL LOW (ref 4.22–5.81)
RDW: 13.8 % (ref 11.5–15.5)
WBC: 7.8 10*3/uL (ref 4.0–10.5)
nRBC: 0 % (ref 0.0–0.2)

## 2021-10-16 SURGERY — ECHOCARDIOGRAM, TRANSESOPHAGEAL
Anesthesia: Monitor Anesthesia Care

## 2021-10-16 MED ORDER — PROPOFOL 500 MG/50ML IV EMUL
INTRAVENOUS | Status: DC | PRN
Start: 2021-10-16 — End: 2021-10-16
  Administered 2021-10-16: 125 ug/kg/min via INTRAVENOUS

## 2021-10-16 MED ORDER — PROPOFOL 10 MG/ML IV BOLUS
INTRAVENOUS | Status: DC | PRN
  Administered 2021-10-16: 50 mg via INTRAVENOUS

## 2021-10-16 MED ORDER — SODIUM CHLORIDE 0.9 % IV SOLN: INTRAVENOUS | Status: DC

## 2021-10-16 MED ORDER — LIDOCAINE 2% (20 MG/ML) 5 ML SYRINGE
INTRAMUSCULAR | Status: DC | PRN
  Administered 2021-10-16: 100 mg via INTRAVENOUS

## 2021-10-16 MED ORDER — LINEZOLID 600 MG PO TABS
600.0000 mg | ORAL_TABLET | Freq: Two times a day (BID) | ORAL | 0 refills | Status: AC
  Filled 2021-10-16: qty 20, 10d supply, fill #0

## 2021-10-16 NOTE — Progress Notes (Signed)
Progress Note  Patient: Martin [name] ZOX:[id number] DOB: December 31, 1960 DOA: 10/11/2021  DOS: 10/16/2021  LOS: 5 days   Brief hospital course: AHMADOU BOLZ is a 61 y.o. 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 Labs indicated AKI (SCr 1.29) and demand myocardial ischemia (troponin 241 > 240 without angina) with leukocytosis (WBC 19k). Vancomycin and cefepime were started on admission, changed to ancef when blood cultures grew MSSA. Repeat blood cultures on 2/5 have remained negative. TTE was indeterminate for aortic valve vegetation.  He went for TEE on 2/8, with no evidence of endocarditis.   Assessment and Plan: * MSSA bacteremia- (present on admission) Likely due to cocaine IVDU.  - Converted to ancef 2g IV q8h. Pansensitivity is reported. ID auto-consulted. - Repeat blood cultures 2/4 NGTD x3 days - Echocardiogram cannot rule out AV vegetation.  TEE was performed on 2/8, no evidence of endocarditis . -Antibiotics management per ID, will await further recommendations.    Sepsis- (present on admission) Due to MSSA bacteremia. Sepsis physiology resolving.  IVDU, cocaine use- (present on admission) Cessation counseling provided. HIV and RPR nonreactive, HepBAb negative. - Hepatitis C pending - This patient cannot be discharged to an unsupervised environment with PICC line.  Normocytic anemia- (present on admission) Stable. 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  Thrombocytopenia- (present on admission) Acute, worsened with IV fluids (all cell lines down). Suspect due to sepsis. Had this during 2013 as well. Stabilized > 50 k for now with immature fraction elevated.  - Monitor prior to  TEE.  Demand ischemia- (present on admission) Troponin trend is flat, no chest pain, and no ischemic ECG changes, no WMA on echo. Suspect demand ischemia due to sepsis.  - Restarted eliquis, antiplatelet. Not on beta blocker, won't start with +cocaine on UDS.  Periodontal disease- (present on admission) No large abscess on orthopantogram. Needs significant excisions but not urgently.  BPH - (present on admission) - Continue tamsulosin  Emphysema- (present on admission) No wheezing.  - Continue prn albuterol  Tobacco use- (present on admission) - Cessation counseling provided, nicotine patch ordered.  AKI- (present on admission) Due to dehydration and sepsis. SCr 1.29 on admission, has returned to baseline 0.7-0.8.  Peripheral vascular disease- (present on admission) No critical limb ischemia noted.  - Continue eliquis (interestingly, on 2.37m BID per vascular surgery despite not meeting criteria for dose reduction, will not change dose at this time), plavix.  - Recommended he be on statin, though he is reluctant. Will not start at this time.   Chronic pain syndrome- (present on admission) Confirmed methadone dosing and alerted [facility] to patient's current admission.  - Continue methadone 1139mdaily chronic dose  Vitamin B12 deficiency- (present on admission) Likely causing an element of peripheral neuropathy, as this was noted in PMDarganConfirmed to be low here.  - IM supplementation daily x1 week, weekly x1 month, then monthly  Anal cancer- (present on admission) History of excision, chemotherapy, and radiation, diagnosed 2013.    Subjective:  Patient reports he is feeling better, no fever, no chills, no dyspnea or palpitation.    Objective: Vitals:   10/16/21 1055 10/16/21 1110 10/16/21 1125 10/16/21 1143  BP: (!) 114/51 (!) 109/50 (!) 110/51 (!) 119/54  Pulse: (!) 58 (!) 54 (!) 58 67  Resp: 12 12 15 16   Temp: (!) 97.2 F (36.2 C)  97.6 F (36.4 C) 97.8  F (36.6 C)  TempSrc:    Oral  SpO2: 94% 97% 96% 97%  Weight:      Height:       Awake Alert, Oriented X 3, frail, chronically ill-appearing. Symmetrical Chest wall movement, Good air movement bilaterally, CTAB RRR,No Gallops,Rubs or new Murmurs, No Parasternal Heave +ve B.Sounds, Abd Soft, No tenderness, No rebound - guarding or rigidity. No Cyanosis, Clubbing or edema, No new Rash or bruise     Data Personally reviewed: WBC 19 > 11 Hgb 12 > 9.8 Plt 113 > 74.  Creatinine 1.29 > 0.83 Albumin 3.2 LFTs wnl UA unremarkable CXR Emphysema, no infiltrate HIV NR; RPR negative.  Blood cultures 2/3: MSSA, pansensitive Blood cultures 2/4: NGTD TTE 2/4: Normal biventricular function. Mild MR with hypermobility in submitral apparatus (said to be atypical for vegetation based on position). Mod AR with restricted left coronary cusp motion and moderate nodular calcification.  TEE 2/4: No endocarditis.  Orthopantogram 10/11/2021 Limited examination due to patient motion demonstrating diffusely poor dentition with multiple tooth extractions, dental caries, and periapical lucencies suggesting periodontal disease. SARS-CoV-2 PCR (2/3): Negative Influenza A/B: Negative  Family Communication: None at bedside  Disposition: Status is: Inpatient Remains inpatient appropriate because: For continued IV antibiotics and work up of MSSA bacteremia Planned Discharge Destination: TBD  Phillips Climes, MD 10/16/2021 2:44 PM Page by [email or web address]

## 2021-10-16 NOTE — CV Procedure (Signed)
° °  TRANSESOPHAGEAL ECHOCARDIOGRAM (TEE) NOTE  INDICATIONS: infective endocarditis  PROCEDURE:   Informed consent was obtained prior to the procedure. The risks, benefits and alternatives for the procedure were discussed and the patient comprehended these risks.  Risks include, but are not limited to, cough, sore throat, vomiting, nausea, somnolence, esophageal and stomach trauma or perforation, bleeding, low blood pressure, aspiration, pneumonia, infection, trauma to the teeth and death.    After a procedural time-out, the patient was given propofol per anesthesia for sedation.  The patient's heart rate, blood pressure, and oxygen saturation are monitored continuously during the procedure.The oropharynx was anesthetized with topical cetacaine.  The transesophageal probe was inserted in the esophagus and stomach without difficulty and multiple views were obtained.  The patient was kept under observation until the patient left the procedure room. I was present face-to-face 100% of this time. The patient left the procedure room in stable condition.   Agitated microbubble saline contrast was not administered.  COMPLICATIONS:    There were no immediate complications.  Findings:  LEFT VENTRICLE: The left ventricular wall thickness is normal.  The left ventricular cavity is normal in size. Wall motion is normal.  LVEF is 55-60%.  RIGHT VENTRICLE:  The right ventricle is normal in structure and function without any thrombus or masses.    LEFT ATRIUM:  The left atrium is mildly dilated in size without any thrombus or masses.  There is not spontaneous echo contrast ("smoke") in the left atrium consistent with a low flow state.  LEFT ATRIAL APPENDAGE:  The left atrial appendage is free of any thrombus or masses. The appendage has single lobes. Pulse doppler indicates high flow in the appendage.  ATRIAL SEPTUM:  The atrial septum appears intact and is free of thrombus and/or masses.  There is no  evidence for interatrial shunting by color doppler.  RIGHT ATRIUM:  The right atrium is normal in size and function without any thrombus or masses.  MITRAL VALVE:  The mitral valve is degenerative in structure and function with Mild regurgitation with multiple jets.  There were no vegetations or stenosis.  AORTIC VALVE:  The aortic valve is trileaflet, normal in structure and function with Moderate central regurgitation.  There were no vegetations or stenosis.  TRICUSPID VALVE:  The tricuspid valve is normal in structure and function with  trivial  regurgitation.  There were no vegetations or stenosis   PULMONIC VALVE:  The pulmonic valve is normal in structure and function with  trivial  regurgitation.  There were no vegetations or stenosis.   AORTIC ARCH, ASCENDING AND DESCENDING AORTA:  There was grade 1 Ron Parker et. Al, 1992) atherosclerosis of the aortic arch and proximal descending aorta.  12. PULMONARY VEINS: Anomalous pulmonary venous return was not noted.  13. PERICARDIUM: The pericardium appeared normal and non-thickened.  There is no pericardial effusion.  IMPRESSION:   No endocarditis Moderate central AI Mild MR, multiple jets, degenerated leaflets Mild LAE LVEF 55-60%  RECOMMENDATIONS:    No endocarditis. Treatment of MSSA bacteremia per guidelines.  Time Spent Directly with the Patient:  45 minutes   [name], MD, [facility], [place] Director of the [facility]  [facility] Diplomate of the American Board of Clinical Lipidology Attending Cardiologist  Direct Dial: [phone]   Fax: [phone]  Website:  [email or web address] 10/16/2021, 11:05 AM

## 2021-10-16 NOTE — Anesthesia Postprocedure Evaluation (Signed)
Anesthesia Post Note  Patient: Martin [name]  Procedure(s) Performed: TRANSESOPHAGEAL ECHOCARDIOGRAM (TEE)     Patient location during evaluation: PACU Anesthesia Type: MAC Level of consciousness: awake and alert Pain management: pain level controlled Vital Signs Assessment: post-procedure vital signs reviewed and stable Respiratory status: spontaneous breathing, nonlabored ventilation and respiratory function stable Cardiovascular status: blood pressure returned to baseline Postop Assessment: no apparent nausea or vomiting Anesthetic complications: no   No notable events documented.  Last Vitals:  Vitals:   10/16/21 1125 10/16/21 1143  BP: (!) 110/51 (!) 119/54  Pulse: (!) 58 67  Resp: 15 16  Temp: 36.4 C 36.6 C  SpO2: 96% 97%    Last Pain:  Vitals:   10/16/21 1143  TempSrc: Oral  PainSc: Blanket

## 2021-10-16 NOTE — TOC Benefit Eligibility Note (Signed)
Patient Teacher, English as a foreign language completed.    The patient is currently admitted and upon discharge could be taking linezolid (Zyvox) 600 mg tablets.  The current 30 day co-pay is, $1.45.   The patient is insured through [place], [place] Patient Advocate Specialist [place] Patient Advocate Team Direct Number: [phone]  Fax: [phone]

## 2021-10-16 NOTE — Transfer of Care (Signed)
Immediate Anesthesia Transfer of Care Note  Patient: Martin [name]  Procedure(s) Performed: TRANSESOPHAGEAL ECHOCARDIOGRAM (TEE)  Patient Location: PACU  Anesthesia Type:MAC  Level of Consciousness: drowsy and patient cooperative  Airway & Oxygen Therapy: Patient Spontanous Breathing  Post-op Assessment: Report given to RN and Post -op Vital signs reviewed and stable  Post vital signs: Reviewed and stable  Last Vitals:  Vitals Value Taken Time  BP 114/51 10/16/21 1055  Temp    Pulse 56 10/16/21 1055  Resp 13 10/16/21 1055  SpO2 94 % 10/16/21 1055  Vitals shown include unvalidated device data.  Last Pain:  Vitals:   10/16/21 1008  TempSrc: Temporal  PainSc: 0-No pain      Patients Stated Pain Goal: 3 (04/22/47 1856)  Complications: No notable events documented.

## 2021-10-16 NOTE — Progress Notes (Deleted)
°  Echocardiogram 2D Echocardiogram has been performed.  Bobbye Charleston 10/16/2021, 10:57 AM

## 2021-10-16 NOTE — Progress Notes (Incomplete)
°  Echocardiogram Echocardiogram Transesophageal has been performed.  Bobbye Charleston 10/16/2021, 10:57 AM

## 2021-10-16 NOTE — Interval H&P Note (Signed)
History and Physical Interval Note:  10/16/2021 10:05 AM  Martin [name]  has presented today for surgery, with the diagnosis of BACTEREMIA.  The various methods of treatment have been discussed with the patient and family. After consideration of risks, benefits and other options for treatment, the patient has consented to  Procedure(s): TRANSESOPHAGEAL ECHOCARDIOGRAM (TEE) (N/A) as a surgical intervention.  The patient's history has been reviewed, patient examined, no change in status, stable for surgery.  I have reviewed the patient's chart and labs.  Questions were answered to the patient's satisfaction.     [name]

## 2021-10-16 NOTE — TOC Progression Note (Signed)
Transition of Care [facility]) - Progression Note    Patient Details  Name: Martin [name] MRN: [id number] Date of Birth: Nov 25, 1960  Transition of Care [facility]) CM/SW [place], LCSW Phone Number: 10/16/2021, 9:29 AM  Clinical Narrative:    No SNF bed offers. Patient will need to remain in the hospital for IV antibiotics.    Expected Discharge Plan: Home/Self Care Barriers to Discharge: Continued Medical Work up, Active Substance Use with PICC Line  Expected Discharge Plan and Services Expected Discharge Plan: Home/Self Care In-house Referral: Clinical Social Work     Living arrangements for the past 2 months: Single Family Home                                       Social Determinants of Health (SDOH) Interventions    Readmission Risk Interventions No flowsheet data found.

## 2021-10-16 NOTE — Progress Notes (Signed)
Physical Therapy Treatment Patient Details Name: Martin [name] MRN: [id number] DOB: [birth year] Today's Date: 10/16/2021   History of Present Illness pt is a 61 y/o male admitted2/3 with 2 days of worsening fatigue, generalized weakness, N/V and lightheadedness.  Work up found MSSA bacteremia.  PMHx:  Anal CA, DJD, HTN, PVD    PT Comments    Patient agreeable to OOB and ambulating, however did not want to sit up in recliner due to back pain. Continues to have imbalance during gait requiring use of RW to stabilize himself. Patient agrees will need RW for home use.     Recommendations for follow up therapy are one component of a multi-disciplinary discharge planning process, led by the attending physician.  Recommendations may be updated based on patient status, additional functional criteria and insurance authorization.  Follow Up Recommendations  Home health PT     Assistance Recommended at Discharge Intermittent Supervision/Assistance  Patient can return home with the following A little help with walking and/or transfers;Assist for transportation;Help with stairs or ramp for entrance;Assistance with cooking/housework   Equipment Recommendations  Rolling walker (2 wheels)    Recommendations for Other Services       Precautions / Restrictions Precautions Precautions: Fall     Mobility  Bed Mobility Overal bed mobility: Modified Independent                  Transfers Overall transfer level: Needs assistance Equipment used: Rolling walker (2 wheels) Transfers: Sit to/from Stand Sit to Stand: Min guard           General transfer comment: vc for safe use of RW    Ambulation/Gait Ambulation/Gait assistance: Min guard (periods where mod needed to regain balance.) Gait Distance (Feet): 350 Feet Assistive device: Rolling walker (2 wheels), None Gait Pattern/deviations: Step-through pattern, Drifts right/left Gait velocity: able to change speed, but generally  slower. Gait velocity interpretation: 1.31 - 2.62 ft/sec, indicative of limited community ambulator   General Gait Details: slight drift to rt; offered to try ambulation without RW and pt refused stating he felt too off-balance   Stairs             Wheelchair Mobility    Modified Rankin (Stroke Patients Only)       Balance Overall balance assessment: Needs assistance Sitting-balance support: No upper extremity supported, Feet supported Sitting balance-Leahy Scale: Good     Standing balance support: Single extremity supported, Bilateral upper extremity supported, During functional activity Standing balance-Leahy Scale: Poor                              Cognition Arousal/Alertness: Awake/alert Behavior During Therapy: WFL for tasks assessed/performed Overall Cognitive Status: Within Functional Limits for tasks assessed                                          Exercises      General Comments General comments (skin integrity, edema, etc.): pt refused OOB to recliner due to recliner hurts his back      Pertinent Vitals/Pain Pain Assessment Pain Assessment: Faces Faces Pain Scale: Hurts little more Pain Location: low back Pain Descriptors / Indicators: Sore, Aching Pain Intervention(s): Limited activity within patient's tolerance, Monitored during session, Repositioned    Home Living  Prior Function            PT Goals (current goals can now be found in the care plan section) Acute Rehab PT Goals Patient Stated Goal: back to independent level. Time For Goal Achievement: 10/28/21 Potential to Achieve Goals: Good Progress towards PT goals: Progressing toward goals    Frequency    Min 3X/week      PT Plan Current plan remains appropriate    Co-evaluation              AM-PAC PT "6 Clicks" Mobility   Outcome Measure  Help needed turning from your back to your side while in a  flat bed without using bedrails?: None Help needed moving from lying on your back to sitting on the side of a flat bed without using bedrails?: None Help needed moving to and from a bed to a chair (including a wheelchair)?: A Little Help needed standing up from a chair using your arms (e.g., wheelchair or bedside chair)?: A Little Help needed to walk in hospital room?: A Little Help needed climbing 3-5 steps with a railing? : A Little 6 Click Score: 20    End of Session Equipment Utilized During Treatment: Gait belt Activity Tolerance: Patient tolerated treatment well Patient left: in bed;with call bell/phone within reach;with bed alarm set Nurse Communication: Mobility status PT Visit Diagnosis: Unsteadiness on feet (R26.81);Difficulty in walking, not elsewhere classified (R26.2)     Time: 3754-3606 PT Time Calculation (min) (ACUTE ONLY): 15 min  Charges:  $Gait Training: 8-22 mins                      [name], PT Acute Rehabilitation Services  Pager [phone] Office [phone]    [name] 10/16/2021, 8:50 AM

## 2021-10-16 NOTE — Progress Notes (Signed)
OT Cancellation Note  Patient Details Name: Martin [name] MRN: [id number] DOB: December 22, 1960   Cancelled Treatment:    Reason Eval/Treat Not Completed: Patient at procedure or test/ unavailable (pt off unit for TEE). OT will follow up next available time as appropriate  Britt Bottom 10/16/2021, 11:01 AM

## 2021-10-17 ENCOUNTER — Other Ambulatory Visit (HOSPITAL_COMMUNITY): Payer: Self-pay

## 2021-10-17 DIAGNOSIS — F1410 Cocaine abuse, uncomplicated: Secondary | ICD-10-CM | POA: Diagnosis not present

## 2021-10-17 DIAGNOSIS — C210 Malignant neoplasm of anus, unspecified: Secondary | ICD-10-CM | POA: Diagnosis not present

## 2021-10-17 DIAGNOSIS — B9561 Methicillin susceptible Staphylococcus aureus infection as the cause of diseases classified elsewhere: Secondary | ICD-10-CM | POA: Diagnosis not present

## 2021-10-17 DIAGNOSIS — R7881 Bacteremia: Secondary | ICD-10-CM | POA: Diagnosis not present

## 2021-10-17 LAB — CULTURE, BLOOD (ROUTINE X 2)
Culture: NO GROWTH
Culture: NO GROWTH
Special Requests: ADEQUATE

## 2021-10-17 MED ORDER — VITAMIN B-12 1000 MCG PO TABS
1000.0000 ug | ORAL_TABLET | Freq: Every day | ORAL | 0 refills | Status: DC
  Filled 2021-10-17: qty 30, 30d supply, fill #0

## 2021-10-17 MED ORDER — CYANOCOBALAMIN 250 MCG PO TABS: 250.0000 ug | ORAL_TABLET | Freq: Every day | ORAL | Status: DC

## 2021-10-17 MED ORDER — FOLIC ACID 1 MG PO TABS: 1.0000 mg | ORAL_TABLET | Freq: Every day | ORAL | Status: DC

## 2021-10-17 NOTE — Plan of Care (Signed)
°  Problem: Fluid Volume: Goal: Hemodynamic stability will improve Outcome: Adequate for Discharge   Problem: Clinical Measurements: Goal: Diagnostic test results will improve Outcome: Adequate for Discharge Goal: Signs and symptoms of infection will decrease Outcome: Adequate for Discharge   Problem: Respiratory: Goal: Ability to maintain adequate ventilation will improve Outcome: Adequate for Discharge   Problem: Acute Rehab PT Goals(only PT should resolve) Goal: Patient Will Transfer Sit To/From Stand Outcome: Adequate for Discharge Goal: Pt Will Transfer Bed To Chair/Chair To Bed Outcome: Adequate for Discharge Goal: Pt Will Ambulate Outcome: Adequate for Discharge Goal: Pt Will Go Up/Down Stairs Outcome: Adequate for Discharge   Problem: Acute Rehab OT Goals (only OT should resolve) Goal: Pt. Will Perform Grooming Outcome: Adequate for Discharge Goal: Pt. Will Perform Lower Body Bathing Outcome: Adequate for Discharge Goal: Pt. Will Perform Lower Body Dressing Outcome: Adequate for Discharge Goal: Pt. Will Transfer To Toilet Outcome: Adequate for Discharge Goal: Pt. Will Perform Toileting-Clothing Manipulation Outcome: Adequate for Discharge

## 2021-10-17 NOTE — Progress Notes (Signed)
Occupational Therapy Treatment Patient Details Name: Martin [name] MRN: [id number] DOB: [birth year] Today's Date: 10/17/2021   History of present illness pt is a 61 y/o male admitted2/3 with 2 days of worsening fatigue, generalized weakness, N/V and lightheadedness.  Work up found MSSA bacteremia.  PMHx:  Anal CA, DJD, HTN, PVD   OT comments  Pt making good progress with functional goals. Pt eager hopeful to d/c home later today. Pt able to stand form EOB min guard A to walk to bathroom to transfer to toilet with Sup and to shower min guard A with cues for safety to use grab bar. Pt stood at sink with Sup for UB dressing, grooming and simulated bathing tasks.    Recommendations for follow up therapy are one component of a multi-disciplinary discharge planning process, led by the attending physician.  Recommendations may be updated based on patient status, additional functional criteria and insurance authorization.    Follow Up Recommendations  Home health OT    Assistance Recommended at Discharge    Patient can return home with the following  A little help with bathing/dressing/bathroom;A little help with walking and/or transfers   Equipment Recommendations  Other (comment) (RW)    Recommendations for Other Services      Precautions / Restrictions Precautions Precautions: Fall Restrictions Weight Bearing Restrictions: No       Mobility Bed Mobility Overal bed mobility: Modified Independent                  Transfers Overall transfer level: Needs assistance Equipment used: Rolling walker (2 wheels) Transfers: Sit to/from Stand Sit to Stand: Min guard, Supervision           General transfer comment: no AD for walking to bathroom and standing at sink for ADLs     Balance Overall balance assessment: Needs assistance Sitting-balance support: No upper extremity supported, Feet supported Sitting balance-Leahy Scale: Good     Standing balance support: Single  extremity supported, Bilateral upper extremity supported, During functional activity Standing balance-Leahy Scale: Fair                             ADL either performed or assessed with clinical judgement   ADL Overall ADL's : Needs assistance/impaired     Grooming: Wash/dry hands;Wash/dry face;Brushing hair;Supervision/safety;Standing       Lower Body Bathing: Min guard;Supervison/ safety;Sit to/from stand Lower Body Bathing Details (indicate cue type and reason): simulated standing at sink Upper Body Dressing : Supervision/safety;Standing       Toilet Transfer: Min guard;Supervision/safety;Ambulation;Regular Toilet;Grab bars   Toileting- Clothing Manipulation and Hygiene: Supervision/safety;Sit to/from stand   Tub/ Shower Transfer: Min guard;Cueing for safety;Grab bars;Ambulation   Functional mobility during ADLs: Min guard;Supervision/safety      Extremity/Trunk Assessment Upper Extremity Assessment Upper Extremity Assessment: Overall WFL for tasks assessed   Lower Extremity Assessment Lower Extremity Assessment: Defer to PT evaluation   Cervical / Trunk Assessment Cervical / Trunk Assessment: Normal    Vision Baseline Vision/History: 1 Wears glasses Ability to See in Adequate Light: 0 Adequate Patient Visual Report: No change from baseline     Perception     Praxis      Cognition Arousal/Alertness: Awake/alert Behavior During Therapy: WFL for tasks assessed/performed Overall Cognitive Status: Within Functional Limits for tasks assessed  Exercises      Shoulder Instructions       General Comments      Pertinent Vitals/ Pain       Pain Assessment Pain Assessment: Faces Faces Pain Scale: Hurts a little bit Pain Descriptors / Indicators: Aching (stiffness) Pain Intervention(s): Monitored during session, Repositioned  Home Living                                           Prior Functioning/Environment              Frequency  Min 2X/week        Progress Toward Goals  OT Goals(current goals can now be found in the care plan section)  Progress towards OT goals: Progressing toward goals     Plan Discharge plan remains appropriate;Frequency remains appropriate    Co-evaluation                 AM-PAC OT "6 Clicks" Daily Activity     Outcome Measure   Help from another person eating meals?: None Help from another person taking care of personal grooming?: A Little Help from another person toileting, which includes using toliet, bedpan, or urinal?: A Little Help from another person bathing (including washing, rinsing, drying)?: A Little Help from another person to put on and taking off regular upper body clothing?: None Help from another person to put on and taking off regular lower body clothing?: A Little 6 Click Score: 20    End of Session Equipment Utilized During Treatment: Gait belt;Rolling walker (2 wheels)  OT Visit Diagnosis: Muscle weakness (generalized) (M62.81);Unsteadiness on feet (R26.81)   Activity Tolerance Patient tolerated treatment well   Patient Left in bed;with call bell/phone within reach;with bed alarm set   Nurse Communication          Time: 1030-1056 OT Time Calculation (min): 26 min  Charges: OT General Charges $OT Visit: 1 Visit OT Treatments $Self Care/Home Management : 8-22 mins $Therapeutic Activity: 8-22 mins    Britt Bottom 10/17/2021, 11:43 AM

## 2021-10-17 NOTE — TOC Progression Note (Signed)
Transition of Care [facility]) - Progression Note    Patient Details  Name: Martin [name] MRN: [id number] Date of Birth: July 13, 1961  [facility] CM/SW Contact  Venus Gilles Aileen Fass, [place] Work Phone Number: 10/17/2021, 11:58 AM  Clinical Narrative:    CSW intern visited patient at bedside to provide resources for substance use treatment facilities. The patient stated he is receiving treatment with [facility] and will contact them to continue his treatment once discharged from the hospital. The patient accepted the resources packet just in case [facility] does not work out. CSW intern will follow up if any questions arise.   Expected Discharge Plan: Home/Self Care Barriers to Discharge: Continued Medical Work up, Active Substance Use with PICC Line  Expected Discharge Plan and Services Expected Discharge Plan: Home/Self Care In-house Referral: Clinical Social Work     Living arrangements for the past 2 months: Single Family Home Expected Discharge Date: 10/17/21                                     Social Determinants of Health (SDOH) Interventions    Readmission Risk Interventions No flowsheet data found.

## 2021-10-17 NOTE — Discharge Summary (Signed)
Physician Discharge Summary  FINNICK OROSZ XTK:[id number] DOB: September 05, 1961 DOA: 10/11/2021  PCP: Camillia Herter, NP  Admit date: 10/11/2021 Discharge date: 10/17/2021  Admitted From: Home Disposition:  Home  Recommendations for Outpatient Follow-up:  Follow up with PCP in 1-2 weeks Please obtain BMP/CBC in one week Patient to follow-up with ID on 2/21  Home Health:YES  Discharge Condition:Stable CODE STATUS:FULL Diet recommendation: Heart Healthy   Brief/Interim Summary:  DICKEY CAAMANO is a 61 y.o. male with a history of anal CA s/p excision, chemoradiation 2013, PVD s/p multiple bilateral lower extremity angioplasties/stent in Oct, Nov, and Dec 2022, OUD on methadone, and cocaine injection who presented to the ED 2/3 with 2 days of worsening fatigue, generalized weakness, found to be febrile on arrival to 100.21F, normotensive. Labs indicated AKI (SCr 1.29) and demand myocardial ischemia (troponin 241 > 240 without angina) with leukocytosis (WBC 19k). Vancomycin and cefepime were started on admission, changed to ancef when blood cultures grew MSSA. Repeat blood cultures on 2/5 have remained negative. TTE was indeterminate for aortic valve vegetation.  He went for TEE on 2/8, with no evidence of endocarditis.   MSSA bacteremia- (present on admission) Likely due to cocaine IVDU.  -  ID were consulted, he was treated with IV Ancef during hospital stay, repeat cultures 2/4 with no growth to date, Echocardiogram cannot rule out AV vegetation.  TEE was performed on 2/8, no evidence of endocarditis .  No indication for IV antibiotics on discharge, patient to continue another 10 days of oral Zyvox as an outpatient, appointment has been scheduled on 10/29/2021 with ID.   Sepsis- (present on admission) Due to MSSA bacteremia. resolved   IVDU, cocaine use- (present on admission) Cessation counseling provided. HIV and RPR nonreactive, HepBAb negative. - Hepatitis C not deftedted - This patient  cannot be discharged to an unsupervised environment with PICC line.   Normocytic anemia- (present on admission) Stable. 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   Thrombocytopenia- (present on admission) Acute,Suspect due to sepsis. Had this during 2013 as well. Stabilized .   Demand ischemia- (present on admission) Troponin trend is flat, no chest pain, and no ischemic ECG changes, no WMA on echo. Suspect demand ischemia due to sepsis.  - Restarted eliquis, antiplatelet. Not on beta blocker, won't start with +cocaine on UDS.   Periodontal disease- (present on admission) No large abscess on orthopantogram. Needs significant excisions but not urgently.   BPH - (present on admission) - Continue tamsulosin   Emphysema- (present on admission) No wheezing.  - Continue prn albuterol   Tobacco use- (present on admission) - Cessation counseling provided, nicotine patch ordered.   AKI- (present on admission) Due to dehydration and sepsis. SCr 1.29 on admission, has returned to baseline 0.7-0.8.   Peripheral vascular disease- (present on admission) No critical limb ischemia noted.  - Continue eliquis (interestingly, on 2.85m BID per vascular surgery despite not meeting criteria for dose reduction, will not change dose at this time), plavix.    Chronic pain syndrome- (present on admission) Confirmed methadone dosing and alerted [facility] to patient's current admission.  - Continue methadone 1147mdaily chronic dose   Vitamin B12 deficiency- (present on admission) Likely causing an element of peripheral neuropathy, [place]  -Treated with IM supplements during hospital stay, to continue with p.o. supplements on discharge.   Anal cancer- (present on admission) History of excision, chemotherapy, and radiation, diagnosed 2013.  Discharge Diagnoses:  Principal Problem:   MSSA bacteremia Active Problems:   Sepsis   IVDU,  cocaine use   Normocytic anemia   Thrombocytopenia   Demand ischemia   Anal cancer   Vitamin B12 deficiency   Chronic pain syndrome   Hyperlipidemia   Peripheral vascular disease   AKI   Tobacco use   Emphysema   BPH    Periodontal disease    Discharge Instructions  Discharge Instructions     Diet - low sodium heart healthy   Complete by: As directed    Discharge instructions   Complete by: As directed    Follow with Primary MD Camillia Herter, NP in 7 days   Get CBC, CMP, checked  by Primary MD next visit.    Activity: As tolerated with Full fall precautions use walker/cane & assistance as needed   Disposition Home    Diet: Heart Healthy  .  On your next visit with your primary care physician please Get Medicines reviewed and adjusted.   Please request your Prim.MD to go over all Hospital Tests and Procedure/Radiological results at the follow up, please get all Hospital records sent to your Prim MD by signing hospital release before you go home.   If you experience worsening of your admission symptoms, develop shortness of breath, life threatening emergency, suicidal or homicidal thoughts you must seek medical attention immediately by calling 911 or calling your MD immediately  if symptoms less severe.  You Must read complete instructions/literature along with all the possible adverse reactions/side effects for all the Medicines you take and that have been prescribed to you. Take any new Medicines after you have completely understood and accpet all the possible adverse reactions/side effects.   Do not drive, operating heavy machinery, perform activities at heights, swimming or participation in water activities or provide baby sitting services if your were admitted for syncope or siezures until you have seen by Primary MD or a Neurologist and advised to do so again.  Do not drive when taking Pain medications.    Do not take more than prescribed Pain, Sleep and  Anxiety Medications  Special Instructions: If you have smoked or chewed Tobacco  in the last 2 yrs please stop smoking, stop any regular Alcohol  and or any Recreational drug use.  Wear Seat belts while driving.   Please note  You were cared for by a hospitalist during your hospital stay. If you have any questions about your discharge medications or the care you received while you were in the hospital after you are discharged, you can call the unit and asked to speak with the hospitalist on call if the hospitalist that took care of you is not available. Once you are discharged, your primary care physician will handle any further medical issues. Please note that NO REFILLS for any discharge medications will be authorized once you are discharged, as it is imperative that you return to your primary care physician (or establish a relationship with a primary care physician if you do not have one) for your aftercare needs so that they can reassess your need for medications and monitor your lab values.   Increase activity slowly   Complete by: As directed       Allergies as of 10/17/2021       Reactions   Yellow Jacket Venom [bee Venom] Anaphylaxis   Aspirin Swelling   Codeine Nausea Only   Ibuprofen    On blood thinners   Morphine  And Related Itching        Medication List     TAKE these medications    acetaminophen 325 MG tablet Commonly known as: TYLENOL Take 650 mg by mouth every 6 (six) hours as needed for moderate pain.   albuterol 108 (90 Base) MCG/ACT inhaler Commonly known as: VENTOLIN HFA Inhale 2 puffs into the lungs every 6 (six) hours as needed for wheezing or shortness of breath.   apixaban 2.5 MG Tabs tablet Commonly known as: ELIQUIS Take 1 tablet (2.5 mg total) by mouth 2 (two) times daily.   atorvastatin 40 MG tablet Commonly known as: LIPITOR Take 1 tablet (40 mg total) by mouth daily.   clopidogrel 75 MG tablet Commonly known as: Plavix Take 1 tablet (75  mg total) by mouth daily.   EPINEPHrine 0.3 mg/0.3 mL Soaj injection Commonly known as: EpiPen 2-Pak Inject 0.3 mLs (0.3 mg total) into the muscle as needed for anaphylaxis.   folic acid 1 MG tablet Commonly known as: FOLVITE Take 1 tablet (1 mg total) by mouth daily. Start taking on: October 18, 2021   linezolid 600 MG tablet Commonly known as: ZYVOX Take 1 tablet (600 mg total) by mouth 2 (two) times daily for 10 days. Stop date 10/26/21   methadone 10 MG/ML solution Commonly known as: DOLOPHINE Take 115 mg by mouth daily. Confirmed with [facility] 10/12/21   nicotine 14 mg/24hr patch Commonly known as: Nicoderm CQ Place 1 patch (14 mg total) onto the skin daily.   tamsulosin 0.4 MG Caps capsule Commonly known as: FLOMAX Take 1 capsule (0.4 mg total) by mouth at bedtime.   umeclidinium-vilanterol 62.5-25 MCG/INH Aepb Commonly known as: ANORO ELLIPTA Inhale 1 puff into the lungs daily.   vitamin B-12 1000 MCG tablet Commonly known as: CYANOCOBALAMIN Take 1 tablet (1,000 mcg total) by mouth daily.               Durable Medical Equipment  (From admission, onward)           Start     Ordered   10/17/21 1157  For home use only DME Walker rolling  Once       Question Answer Comment  Walker: With [place] Wheels   Patient needs a walker to treat with the following condition Weakness      10/17/21 1157   10/17/21 1157  For home use only DME Shower stool  Once        10/17/21 1157            Follow-up Information     Golden Circle, FNP Follow up.   Specialty: Infectious Diseases Why: 10/29/21 at 2:30pm. Please call to reschedule if unable to make this appointment. Contact information: [address] [place] [address] [phone]                Allergies  Allergen Reactions   Yellow Jacket Venom [Bee Venom] Anaphylaxis   Aspirin Swelling   Codeine Nausea Only   Ibuprofen     On blood thinners   Morphine And  Related Itching    Consultations: ID Cariology for TEE   Procedures/Studies: DG Orthopantogram  Result Date: 10/11/2021 CLINICAL DATA:  Sepsis and periodontal disease. EXAM: ORTHOPANTOGRAM/PANORAMIC COMPARISON:  None. FINDINGS: Technically limited study due to patient motion despite multiple retakes. Extremely poor dentition with multiple prior tooth extractions and extensive dental caries. Periapical lucencies and bone resorption throughout all the remaining teeth consistent with periodontal disease.  No acute fracture or TMJ dislocation identified. IMPRESSION: Limited examination due to patient motion demonstrating diffusely poor dentition with multiple tooth extractions, dental caries, and periapical lucencies suggesting periodontal disease. Electronically Signed   By: Lucienne Capers M.D.   On: 10/11/2021 19:49   DG Chest 2 View  Result Date: 10/11/2021 CLINICAL DATA:  Chest pain EXAM: CHEST - 2 VIEW COMPARISON:  Chest x-ray 02/09/2021 FINDINGS: Heart size and mediastinal contours are within normal limits. Hyperinflated lungs. No suspicious pulmonary opacities identified. No pleural effusion or pneumothorax visualized. No acute osseous abnormality appreciated. IMPRESSION: Emphysematous changes with no acute process identified. Electronically Signed   By: Ofilia Neas M.D.   On: 10/11/2021 14:12   ECHOCARDIOGRAM COMPLETE  Result Date: 10/12/2021    ECHOCARDIOGRAM REPORT   Patient Name:   MURIEL WILBER [facility] Date of Exam: 10/12/2021 Medical Rec #:  [id number]       Height:       71.0 in Accession #:    [id number]      Weight:       138.2 lb Date of Birth:  1961/07/29       BSA:          1.802 m Patient Age:    6 years        BP:           98/54 mmHg Patient Gender: M               HR:           67 bpm. Exam Location:  Inpatient Procedure: 2D Echo, Cardiac Doppler and Color Doppler Indications:    TIA  History:        Patient has prior history of Echocardiogram examinations. PAD,                  Aortic Valve Disease; Risk Factors:Current Smoker and                 Hypertension. Polysubstance abuse. Bacteremia.  Sonographer:    Merrie Roof RDCS Referring Phys: [place]  1. Left ventricular ejection fraction, by estimation, is 55 to 60%. The left ventricle has normal function. The left ventricle has no regional wall motion abnormalities. Left ventricular diastolic parameters were normal.  2. Right ventricular systolic function is normal. The right ventricular size is normal. There is normal pulmonary artery systolic pressure. The estimated right ventricular systolic pressure is 56.2 mmHg.  3. Left atrial size was moderately dilated.  4. The mitral valve is degenerative. Portions of the submitral apparatus are hypermoble (not typical for vegetation based on position). Mild mitral valve regurgitation.  5. The aortic valve is abnormal. Trileaflet with restricted left coronary cusp motion and associated moderate nodular calcification (cannot exclude old vegetation). Aortic valve regurgitation is moderate. Aortic regurgitation PHT measures 351 msec. Aortic valve mean gradient measures 9.5 mmHg.  6. The inferior vena cava is dilated in size with <50% respiratory variability, suggesting right atrial pressure of 15 mmHg.  7. In setting of MSSA bacteremia and abnormal aortic valve, would suggest TEE if clinically feasible. Comparison(s): Prior images reviewed side by side. Previous study was very limited. FINDINGS  Left Ventricle: Left ventricular ejection fraction, by estimation, is 55 to 60%. The left ventricle has normal function. The left ventricle has no regional wall motion abnormalities. The left ventricular internal cavity size was normal in size. There is  no left ventricular hypertrophy. Left ventricular diastolic parameters were normal. Right Ventricle: The  right ventricular size is normal. No increase in right ventricular wall thickness. Right ventricular systolic function is normal.  There is normal pulmonary artery systolic pressure. The tricuspid regurgitant velocity is 2.05 m/s, and  with an assumed right atrial pressure of 15 mmHg, the estimated right ventricular systolic pressure is 29.2 mmHg. Left Atrium: Left atrial size was moderately dilated. Right Atrium: Right atrial size was normal in size. Pericardium: There is no evidence of pericardial effusion. Mitral Valve: The mitral valve is degenerative in appearance. Mild mitral valve regurgitation. Tricuspid Valve: The tricuspid valve is grossly normal. Tricuspid valve regurgitation is mild. Aortic Valve: The aortic valve is abnormal. Aortic valve regurgitation is moderate. Aortic regurgitation PHT measures 351 msec. Aortic valve mean gradient measures 9.5 mmHg. Aortic valve peak gradient measures 17.4 mmHg. Aortic valve area, by VTI measures 1.92 cm. Pulmonic Valve: The pulmonic valve was not well visualized. Pulmonic valve regurgitation is trivial. Aorta: The aortic root is normal in size and structure. Venous: The inferior vena cava is dilated in size with less than 50% respiratory variability, suggesting right atrial pressure of 15 mmHg. IAS/Shunts: No atrial level shunt detected by color flow Doppler.  LEFT VENTRICLE PLAX 2D LVIDd:         5.10 cm   Diastology LVIDs:         3.40 cm   LV e' medial:    9.46 cm/s LV PW:         0.90 cm   LV E/e' medial:  9.8 LV IVS:        0.60 cm   LV e' lateral:   11.10 cm/s LVOT diam:     2.20 cm   LV E/e' lateral: 8.4 LV SV:         85 LV SV Index:   47 LVOT Area:     3.80 cm  RIGHT VENTRICLE          IVC RV Basal diam:  3.70 cm  IVC diam: 2.60 cm LEFT ATRIUM             Index        RIGHT ATRIUM           Index LA diam:        4.40 cm 2.44 cm/m   RA Area:     17.20 cm LA Vol (A2C):   90.6 ml 50.27 ml/m  RA Volume:   48.10 ml  26.69 ml/m LA Vol (A4C):   78.8 ml 43.72 ml/m LA Biplane Vol: 91.7 ml 50.88 ml/m  AORTIC VALVE AV Area (Vmax):    1.90 cm AV Area (Vmean):   1.87 cm AV Area (VTI):      1.92 cm AV Vmax:           208.50 cm/s AV Vmean:          139.500 cm/s AV VTI:            0.444 m AV Peak Grad:      17.4 mmHg AV Mean Grad:      9.5 mmHg LVOT Vmax:         104.00 cm/s LVOT Vmean:        68.700 cm/s LVOT VTI:          0.224 m LVOT/AV VTI ratio: 0.51 AI PHT:            351 msec  AORTA Ao Root diam: 3.40 cm MITRAL VALVE  TRICUSPID VALVE MV Area (PHT): 3.37 cm    TR Peak grad:   16.8 mmHg MV Decel Time: 225 msec    TR Vmax:        205.00 cm/s MV E velocity: 92.70 cm/s MV A velocity: 71.70 cm/s  SHUNTS MV E/A ratio:  1.29        Systemic VTI:  0.22 m                            Systemic Diam: 2.20 cm Rozann Lesches MD Electronically signed by Rozann Lesches MD Signature Date/Time: 10/12/2021/5:03:24 PM    Final    ECHO TEE  Result Date: 10/16/2021    TRANSESOPHOGEAL ECHO REPORT   Patient Name:   GERAD CORNELIO Date of Exam: 10/16/2021 Medical Rec #:  [id number]       Height:       71.0 in Accession #:    [id number]      Weight:       138.2 lb Date of Birth:  [birth year]       BSA:          1.802 m Patient Age:    61 years        BP:           121/58 mmHg Patient Gender: M               HR:           68 bpm. Exam Location:  Inpatient Procedure: 3D Echo, Transesophageal Echo, Cardiac Doppler and Color Doppler Indications:     Bacteremia  History:         Patient has prior history of Echocardiogram examinations, most                  recent 10/12/2021. Signs/Symptoms:Bacteremia; Risk                  Factors:Current Smoker, Dyslipidemia and Hypertension. Cancer.                  IVDU.  Sonographer:     Roseanna Rainbow RDCS Referring Phys:  [id number] Leanor Kail Diagnosing Phys: Lyman Bishop MD PROCEDURE: After discussion of the risks and benefits of a TEE, an informed consent was obtained from the patient. The transesophogeal probe was passed without difficulty through the esophogus of the patient. Imaged were obtained with the patient in a supine position. Sedation performed by different  physician. The patient was monitored while under deep sedation. Anesthestetic sedation was provided intravenously by Anesthesiology: 18m of Propofol, 1047mof Lidocaine. The patient developed no complications during the procedure. IMPRESSIONS  1. Left ventricular ejection fraction, by estimation, is 55 to 60%. The left ventricle has normal function. The left ventricle has no regional wall motion abnormalities. There is mild left ventricular hypertrophy.  2. Right ventricular systolic function is normal. The right ventricular size is normal.  3. Left atrial size was mildly dilated. No left atrial/left atrial appendage thrombus was detected. The LAA emptying velocity was 62 cm/s.  4. The mitral valve is degenerative. Mild mitral valve regurgitation.  5. No vegetation. The aortic valve is tricuspid. Aortic valve regurgitation moderate central. No aortic stenosis is present. Conclusion(s)/Recommendation(s): No evidence of vegetation/infective endocarditis on this transesophageael echocardiogram. FINDINGS  Left Ventricle: Left ventricular ejection fraction, by estimation, is 55 to 60%. The left ventricle has normal function. The left ventricle has no regional wall motion abnormalities. The left  ventricular internal cavity size was normal in size. There is  mild left ventricular hypertrophy. Right Ventricle: The right ventricular size is normal. No increase in right ventricular wall thickness. Right ventricular systolic function is normal. Left Atrium: Left atrial size was mildly dilated. No left atrial/left atrial appendage thrombus was detected. The LAA emptying velocity was 62 cm/s. Right Atrium: Right atrial size was normal in size. Pericardium: There is no evidence of pericardial effusion. Mitral Valve: The mitral valve is degenerative in appearance. Mild mitral valve regurgitation, with multiple jets. Tricuspid Valve: The tricuspid valve is grossly normal. Tricuspid valve regurgitation is trivial. Aortic Valve: No  vegetation. The aortic valve is tricuspid. Aortic valve regurgitation moderate central. Aortic regurgitation PHT measures 354 msec. No aortic stenosis is present. Pulmonic Valve: The pulmonic valve was grossly normal. Pulmonic valve regurgitation is trivial. Aorta: The aortic root and ascending aorta are structurally normal, with no evidence of dilitation. There is minimal (Grade I) layered plaque involving the aortic arch and descending aorta. IAS/Shunts: No atrial level shunt detected by color flow Doppler.  LEFT VENTRICLE PLAX 2D LVOT diam:     2.10 cm LVOT Area:     3.46 cm  AORTIC VALVE AI PHT:      354 msec  AORTA Ao Asc diam: 3.10 cm  SHUNTS Systemic Diam: 2.10 cm Lyman Bishop MD Electronically signed by Lyman Bishop MD Signature Date/Time: 10/16/2021/11:27:44 AM    Final       Subjective: Patient denies any complaints today, he is eager to go home today.  No significant events overnight as discussed with staff.  Discharge Exam: Vitals:   10/17/21 0757 10/17/21 1140  BP: (!) 130/43 119/64  Pulse: 66 (!) 54  Resp: 16 16  Temp: 98.4 F (36.9 C) 98.6 F (37 C)  SpO2: 97% 98%   Vitals:   10/17/21 0034 10/17/21 0400 10/17/21 0757 10/17/21 1140  BP: (!) 122/54 (!) 111/46 (!) 130/43 119/64  Pulse: 73 60 66 (!) 54  Resp: 15 19 16 16   Temp: 98.1 F (36.7 C) 98 F (36.7 C) 98.4 F (36.9 C) 98.6 F (37 C)  TempSrc: Oral Oral Oral Oral  SpO2: 97% 97% 97% 98%  Weight:      Height:        General: Pt is alert, awake, not in acute distress Cardiovascular: RRR, S1/S2 +, no rubs, no gallops Respiratory: CTA bilaterally, no wheezing, no rhonchi Abdominal: Soft, NT, ND, bowel sounds + Extremities: no edema, no cyanosis    The results of significant diagnostics from this hospitalization (including imaging, microbiology, ancillary and laboratory) are listed below for reference.     Microbiology: Recent Results (from the past 240 hour(s))  Resp Panel by RT-PCR (Flu A&B, Covid)      Status: None   Collection Time: 10/11/21  3:23 PM   Specimen: Nasopharyngeal(NP) swabs in vial transport medium  Result Value Ref Range Status   SARS Coronavirus 2 by RT PCR NEGATIVE NEGATIVE Final    Comment: (NOTE) SARS-CoV-2 target nucleic acids are NOT DETECTED.  The SARS-CoV-2 RNA is generally detectable in upper respiratory specimens during the acute phase of infection. The lowest concentration of SARS-CoV-2 viral copies this assay can detect is 138 copies/mL. A negative result does not preclude SARS-Cov-2 infection and should not be used as the sole basis for treatment or other patient management decisions. A negative result may occur with  improper specimen collection/handling, submission of specimen other than nasopharyngeal swab, presence of viral mutation(s) within  the areas targeted by this assay, and inadequate number of viral copies(<138 copies/mL). A negative result must be combined with clinical observations, patient history, and epidemiological information. The expected result is Negative.  Fact Sheet for Patients:  EntrepreneurPulse.com.au  Fact Sheet for Healthcare Providers:  IncredibleEmployment.be  This test is no t yet approved or cleared by the [place] FDA and  has been authorized for detection and/or diagnosis of SARS-CoV-2 by FDA under an Emergency Use Authorization (EUA). This EUA will remain  in effect (meaning this test can be used) for the duration of the COVID-19 declaration under Section 564(b)(1) of the Act, 21 U.S.C.section 360bbb-3(b)(1), unless the authorization is terminated  or revoked sooner.       Influenza A by PCR NEGATIVE NEGATIVE Final   Influenza B by PCR NEGATIVE NEGATIVE Final    Comment: (NOTE) The Xpert Xpress SARS-CoV-2/FLU/RSV plus assay is intended as an aid in the diagnosis of influenza from Nasopharyngeal swab specimens and should not be used as a sole basis for treatment. Nasal  washings and aspirates are unacceptable for Xpert Xpress SARS-CoV-2/FLU/RSV testing.  Fact Sheet for Patients: EntrepreneurPulse.com.au  Fact Sheet for Healthcare Providers: IncredibleEmployment.be  This test is not yet approved or cleared by the [place] FDA and has been authorized for detection and/or diagnosis of SARS-CoV-2 by FDA under an Emergency Use Authorization (EUA). This EUA will remain in effect (meaning this test can be used) for the duration of the COVID-19 declaration under Section 564(b)(1) of the Act, 21 U.S.C. section 360bbb-3(b)(1), unless the authorization is terminated or revoked.  Performed at [facility], [place] [address], [place], [place] [address]   Culture, blood (routine x 2)     Status: Abnormal   Collection Time: 10/11/21  3:30 PM   Specimen: BLOOD LEFT HAND  Result Value Ref Range Status   Specimen Description BLOOD LEFT HAND  Final   Special Requests   Final    BOTTLES DRAWN AEROBIC AND ANAEROBIC Blood Culture adequate volume   Culture  Setup Time   Final    GRAM POSITIVE COCCI IN CLUSTERS IN BOTH AEROBIC AND ANAEROBIC BOTTLES CRITICAL RESULT CALLED TO, READ BACK BY AND VERIFIED WITH: V BRYK,PHARMD@0523  10/12/21 [place] Performed at [facility], [place] [address] [place], [place] [address]    Culture STAPHYLOCOCCUS AUREUS (A)  Final   Report Status 10/14/2021 FINAL  Final   Organism ID, Bacteria STAPHYLOCOCCUS AUREUS  Final      Susceptibility   Staphylococcus aureus - MIC*    CIPROFLOXACIN <=0.5 SENSITIVE Sensitive     ERYTHROMYCIN <=0.25 SENSITIVE Sensitive     GENTAMICIN <=0.5 SENSITIVE Sensitive     OXACILLIN <=0.25 SENSITIVE Sensitive     TETRACYCLINE <=1 SENSITIVE Sensitive     VANCOMYCIN 1 SENSITIVE Sensitive     TRIMETH/SULFA <=10 SENSITIVE Sensitive     CLINDAMYCIN <=0.25 SENSITIVE Sensitive     RIFAMPIN <=0.5 SENSITIVE Sensitive     Inducible Clindamycin NEGATIVE Sensitive     *  STAPHYLOCOCCUS AUREUS  Blood Culture ID Panel (Reflexed)     Status: Abnormal   Collection Time: 10/11/21  3:30 PM  Result Value Ref Range Status   Enterococcus faecalis NOT DETECTED NOT DETECTED Final   Enterococcus Faecium NOT DETECTED NOT DETECTED Final   Listeria monocytogenes NOT DETECTED NOT DETECTED Final   Staphylococcus species DETECTED (A) NOT DETECTED Final    Comment: CRITICAL RESULT CALLED TO, READ BACK BY AND VERIFIED WITH: V BRYK,PHARMD@0523  10/12/21 [place]  Staphylococcus aureus (BCID) DETECTED (A) NOT DETECTED Final    Comment: CRITICAL RESULT CALLED TO, READ BACK BY AND VERIFIED WITH: V BRYK,PHARMD@0523  10/12/21 [place]    Staphylococcus epidermidis NOT DETECTED NOT DETECTED Final   Staphylococcus lugdunensis NOT DETECTED NOT DETECTED Final   Streptococcus species NOT DETECTED NOT DETECTED Final   Streptococcus agalactiae NOT DETECTED NOT DETECTED Final   Streptococcus pneumoniae NOT DETECTED NOT DETECTED Final   Streptococcus pyogenes NOT DETECTED NOT DETECTED Final   A.calcoaceticus-baumannii NOT DETECTED NOT DETECTED Final   Bacteroides fragilis NOT DETECTED NOT DETECTED Final   Enterobacterales NOT DETECTED NOT DETECTED Final   Enterobacter cloacae complex NOT DETECTED NOT DETECTED Final   Escherichia coli NOT DETECTED NOT DETECTED Final   Klebsiella aerogenes NOT DETECTED NOT DETECTED Final   Klebsiella oxytoca NOT DETECTED NOT DETECTED Final   Klebsiella pneumoniae NOT DETECTED NOT DETECTED Final   Proteus species NOT DETECTED NOT DETECTED Final   Salmonella species NOT DETECTED NOT DETECTED Final   Serratia marcescens NOT DETECTED NOT DETECTED Final   Haemophilus influenzae NOT DETECTED NOT DETECTED Final   Neisseria meningitidis NOT DETECTED NOT DETECTED Final   Pseudomonas aeruginosa NOT DETECTED NOT DETECTED Final   Stenotrophomonas maltophilia NOT DETECTED NOT DETECTED Final   Candida albicans NOT DETECTED NOT DETECTED Final   Candida auris NOT DETECTED NOT  DETECTED Final   Candida glabrata NOT DETECTED NOT DETECTED Final   Candida krusei NOT DETECTED NOT DETECTED Final   Candida parapsilosis NOT DETECTED NOT DETECTED Final   Candida tropicalis NOT DETECTED NOT DETECTED Final   Cryptococcus neoformans/gattii NOT DETECTED NOT DETECTED Final   Meth resistant mecA/C and MREJ NOT DETECTED NOT DETECTED Final    Comment: Performed at [facility], [address] [place], [place] [address]  Culture, blood (routine x 2)     Status: Abnormal   Collection Time: 10/11/21  3:35 PM   Specimen: BLOOD LEFT FOREARM  Result Value Ref Range Status   Specimen Description BLOOD LEFT FOREARM  Final   Special Requests   Final    BOTTLES DRAWN AEROBIC AND ANAEROBIC Blood Culture adequate volume   Culture  Setup Time   Final    GRAM POSITIVE COCCI IN CLUSTERS IN BOTH AEROBIC AND ANAEROBIC BOTTLES CRITICAL VALUE NOTED.  VALUE IS CONSISTENT WITH PREVIOUSLY REPORTED AND CALLED VALUE.    Culture (A)  Final    STAPHYLOCOCCUS AUREUS SUSCEPTIBILITIES PERFORMED ON PREVIOUS CULTURE WITHIN THE LAST 5 DAYS. Performed at [facility], [place] [address], [place], [place] [address]    Report Status 10/14/2021 FINAL  Final  Culture, blood (routine x 2)     Status: None   Collection Time: 10/12/21  2:42 PM   Specimen: BLOOD RIGHT ARM  Result Value Ref Range Status   Specimen Description BLOOD RIGHT ARM  Final   Special Requests   Final    BOTTLES DRAWN AEROBIC ONLY Blood Culture results may not be optimal due to an inadequate volume of blood received in culture bottles   Culture   Final    NO GROWTH 5 DAYS Performed at [facility], [place] [address] [place],  [address]    Report Status 10/17/2021 FINAL  Final  Culture, blood (routine x 2)     Status: None   Collection Time: 10/12/21  2:55 PM   Specimen: BLOOD RIGHT HAND  Result Value Ref Range Status   Specimen Description BLOOD RIGHT HAND  Final   Special  Requests   Final    BOTTLES DRAWN  AEROBIC AND ANAEROBIC Blood Culture adequate volume   Culture   Final    NO GROWTH 5 DAYS Performed at [facility], [place] [address] [place], [place] [address]    Report Status 10/17/2021 FINAL  Final     Labs: BNP (last 3 results) No results for input(s): BNP in the last 8760 hours. Basic Metabolic Panel: Recent Labs  Lab 10/11/21 1345 10/12/21 0109 10/13/21 0131 10/16/21 0117  NA 133* 130* 135 133*  K 4.3 3.8 3.5 3.9  CL 99 103 102 99  CO2 21* 19* 23 25  GLUCOSE 97 113* 104* 123*  BUN 19 18 11 11   CREATININE 1.29* 0.83 0.78 0.78  CALCIUM 9.1 8.1* 8.4* 8.4*   Liver Function Tests: Recent Labs  Lab 10/11/21 1345  AST 26  ALT 15  ALKPHOS 84  BILITOT 0.9  PROT 6.7  ALBUMIN 3.2*   Recent Labs  Lab 10/11/21 1345  LIPASE 21   No results for input(s): AMMONIA in the last 168 hours. CBC: Recent Labs  Lab 10/11/21 1345 10/12/21 0109 10/12/21 1442 10/13/21 0131 10/16/21 0117  WBC 19.0* 11.3* 7.9 7.1 7.8  HGB 12.4* 9.8* 9.8* 9.8* 10.3*  HCT 36.8* 28.2* 29.4* 29.9* 30.3*  MCV 90.2 88.1 91.0 91.7 87.6  PLT 113* 74* 67* 68* 107*   Cardiac Enzymes: No results for input(s): CKTOTAL, CKMB, CKMBINDEX, TROPONINI in the last 168 hours. BNP: Invalid input(s): POCBNP CBG: Recent Labs  Lab 10/11/21 1544  GLUCAP 100*   D-Dimer No results for input(s): DDIMER in the last 72 hours. Hgb A1c No results for input(s): HGBA1C in the last 72 hours. Lipid Profile No results for input(s): CHOL, HDL, LDLCALC, TRIG, CHOLHDL, LDLDIRECT in the last 72 hours. Thyroid function studies No results for input(s): TSH, T4TOTAL, T3FREE, THYROIDAB in the last 72 hours.  Invalid input(s): FREET3 Anemia work up No results for input(s): VITAMINB12, FOLATE, FERRITIN, TIBC, IRON, RETICCTPCT in the last 72 hours. Urinalysis    Component Value Date/Time   COLORURINE YELLOW 10/12/2021 0234   APPEARANCEUR CLEAR 10/12/2021 0234   LABSPEC 1.020 10/12/2021 0234   LABSPEC 1.015  03/02/2012 1620   PHURINE 6.0 10/12/2021 0234   GLUCOSEU NEGATIVE 10/12/2021 0234   GLUCOSEU NEGATIVE 03/19/2012 1426   HGBUR TRACE (A) 10/12/2021 0234   BILIRUBINUR NEGATIVE 10/12/2021 0234   BILIRUBINUR NEG 08/12/2012 1631   BILIRUBINUR Negative 03/02/2012 1620   KETONESUR NEGATIVE 10/12/2021 0234   PROTEINUR NEGATIVE 10/12/2021 0234   UROBILINOGEN 0.2 07/07/2015 2056   NITRITE NEGATIVE 10/12/2021 0234   LEUKOCYTESUR NEGATIVE 10/12/2021 0234   LEUKOCYTESUR Negative 03/02/2012 1620   Sepsis Labs Invalid input(s): PROCALCITONIN,  WBC,  LACTICIDVEN Microbiology Recent Results (from the past 240 hour(s))  Resp Panel by RT-PCR (Flu A&B, Covid)     Status: None   Collection Time: 10/11/21  3:23 PM   Specimen: Nasopharyngeal(NP) swabs in vial transport medium  Result Value Ref Range Status   SARS Coronavirus 2 by RT PCR NEGATIVE NEGATIVE Final    Comment: (NOTE) SARS-CoV-2 target nucleic acids are NOT DETECTED.  The SARS-CoV-2 RNA is generally detectable in upper respiratory specimens during the acute phase of infection. The lowest concentration of SARS-CoV-2 viral copies this assay can detect is 138 copies/mL. A negative result does not preclude SARS-Cov-2 infection and should not be used as the sole basis for treatment or other patient management decisions. A negative result may occur with  improper  specimen collection/handling, submission of specimen other than nasopharyngeal swab, presence of viral mutation(s) within the areas targeted by this assay, and inadequate number of viral copies(<138 copies/mL). A negative result must be combined with clinical observations, patient history, and epidemiological information. The expected result is Negative.  Fact Sheet for Patients:  EntrepreneurPulse.com.au  Fact Sheet for Healthcare Providers:  IncredibleEmployment.be  This test is no t yet approved or cleared by the [place] FDA and  has  been authorized for detection and/or diagnosis of SARS-CoV-2 by FDA under an Emergency Use Authorization (EUA). This EUA will remain  in effect (meaning this test can be used) for the duration of the COVID-19 declaration under Section 564(b)(1) of the Act, 21 U.S.C.section 360bbb-3(b)(1), unless the authorization is terminated  or revoked sooner.       Influenza A by PCR NEGATIVE NEGATIVE Final   Influenza B by PCR NEGATIVE NEGATIVE Final    Comment: (NOTE) The Xpert Xpress SARS-CoV-2/FLU/RSV plus assay is intended as an aid in the diagnosis of influenza from Nasopharyngeal swab specimens and should not be used as a sole basis for treatment. Nasal washings and aspirates are unacceptable for Xpert Xpress SARS-CoV-2/FLU/RSV testing.  Fact Sheet for Patients: EntrepreneurPulse.com.au  Fact Sheet for Healthcare Providers: IncredibleEmployment.be  This test is not yet approved or cleared by the [place] FDA and has been authorized for detection and/or diagnosis of SARS-CoV-2 by FDA under an Emergency Use Authorization (EUA). This EUA will remain in effect (meaning this test can be used) for the duration of the COVID-19 declaration under Section 564(b)(1) of the Act, 21 U.S.C. section 360bbb-3(b)(1), unless the authorization is terminated or revoked.  Performed at [facility], [place] [address], [place], [place] [address]   Culture, blood (routine x 2)     Status: Abnormal   Collection Time: 10/11/21  3:30 PM   Specimen: BLOOD LEFT HAND  Result Value Ref Range Status   Specimen Description BLOOD LEFT HAND  Final   Special Requests   Final    BOTTLES DRAWN AEROBIC AND ANAEROBIC Blood Culture adequate volume   Culture  Setup Time   Final    GRAM POSITIVE COCCI IN CLUSTERS IN BOTH AEROBIC AND ANAEROBIC BOTTLES CRITICAL RESULT CALLED TO, READ BACK BY AND VERIFIED WITH: V BRYK,PHARMD@0523  10/12/21 [place] Performed at [facility],  [place] [address] [place], [place] [address]    Culture STAPHYLOCOCCUS AUREUS (A)  Final   Report Status 10/14/2021 FINAL  Final   Organism ID, Bacteria STAPHYLOCOCCUS AUREUS  Final      Susceptibility   Staphylococcus aureus - MIC*    CIPROFLOXACIN <=0.5 SENSITIVE Sensitive     ERYTHROMYCIN <=0.25 SENSITIVE Sensitive     GENTAMICIN <=0.5 SENSITIVE Sensitive     OXACILLIN <=0.25 SENSITIVE Sensitive     TETRACYCLINE <=1 SENSITIVE Sensitive     VANCOMYCIN 1 SENSITIVE Sensitive     TRIMETH/SULFA <=10 SENSITIVE Sensitive     CLINDAMYCIN <=0.25 SENSITIVE Sensitive     RIFAMPIN <=0.5 SENSITIVE Sensitive     Inducible Clindamycin NEGATIVE Sensitive     * STAPHYLOCOCCUS AUREUS  Blood Culture ID Panel (Reflexed)     Status: Abnormal   Collection Time: 10/11/21  3:30 PM  Result Value Ref Range Status   Enterococcus faecalis NOT DETECTED NOT DETECTED Final   Enterococcus Faecium NOT DETECTED NOT DETECTED Final   Listeria monocytogenes NOT DETECTED NOT DETECTED Final   Staphylococcus species DETECTED (A) NOT DETECTED Final    Comment:  CRITICAL RESULT CALLED TO, READ BACK BY AND VERIFIED WITH: V BRYK,PHARMD@0523  10/12/21 [place]    Staphylococcus aureus (BCID) DETECTED (A) NOT DETECTED Final    Comment: CRITICAL RESULT CALLED TO, READ BACK BY AND VERIFIED WITH: V BRYK,PHARMD@0523  10/12/21 [place]    Staphylococcus epidermidis NOT DETECTED NOT DETECTED Final   Staphylococcus lugdunensis NOT DETECTED NOT DETECTED Final   Streptococcus species NOT DETECTED NOT DETECTED Final   Streptococcus agalactiae NOT DETECTED NOT DETECTED Final   Streptococcus pneumoniae NOT DETECTED NOT DETECTED Final   Streptococcus pyogenes NOT DETECTED NOT DETECTED Final   A.calcoaceticus-baumannii NOT DETECTED NOT DETECTED Final   Bacteroides fragilis NOT DETECTED NOT DETECTED Final   Enterobacterales NOT DETECTED NOT DETECTED Final   Enterobacter cloacae complex NOT DETECTED NOT DETECTED Final   Escherichia coli NOT DETECTED NOT  DETECTED Final   Klebsiella aerogenes NOT DETECTED NOT DETECTED Final   Klebsiella oxytoca NOT DETECTED NOT DETECTED Final   Klebsiella pneumoniae NOT DETECTED NOT DETECTED Final   Proteus species NOT DETECTED NOT DETECTED Final   Salmonella species NOT DETECTED NOT DETECTED Final   Serratia marcescens NOT DETECTED NOT DETECTED Final   Haemophilus influenzae NOT DETECTED NOT DETECTED Final   Neisseria meningitidis NOT DETECTED NOT DETECTED Final   Pseudomonas aeruginosa NOT DETECTED NOT DETECTED Final   Stenotrophomonas maltophilia NOT DETECTED NOT DETECTED Final   Candida albicans NOT DETECTED NOT DETECTED Final   Candida auris NOT DETECTED NOT DETECTED Final   Candida glabrata NOT DETECTED NOT DETECTED Final   Candida krusei NOT DETECTED NOT DETECTED Final   Candida parapsilosis NOT DETECTED NOT DETECTED Final   Candida tropicalis NOT DETECTED NOT DETECTED Final   Cryptococcus neoformans/gattii NOT DETECTED NOT DETECTED Final   Meth resistant mecA/C and MREJ NOT DETECTED NOT DETECTED Final    Comment: Performed at [facility], [address], [place], [place] [address]  Culture, blood (routine x 2)     Status: Abnormal   Collection Time: 10/11/21  3:35 PM   Specimen: BLOOD LEFT FOREARM  Result Value Ref Range Status   Specimen Description BLOOD LEFT FOREARM  Final   Special Requests   Final    BOTTLES DRAWN AEROBIC AND ANAEROBIC Blood Culture adequate volume   Culture  Setup Time   Final    GRAM POSITIVE COCCI IN CLUSTERS IN BOTH AEROBIC AND ANAEROBIC BOTTLES CRITICAL VALUE NOTED.  VALUE IS CONSISTENT WITH PREVIOUSLY REPORTED AND CALLED VALUE.    Culture (A)  Final    STAPHYLOCOCCUS AUREUS SUSCEPTIBILITIES PERFORMED ON PREVIOUS CULTURE WITHIN THE LAST 5 DAYS. Performed at [facility], [place] [address], [place], [place] [address]    Report Status 10/14/2021 FINAL  Final  Culture, blood (routine x 2)     Status: None   Collection Time: 10/12/21  2:42 PM   Specimen:  BLOOD RIGHT ARM  Result Value Ref Range Status   Specimen Description BLOOD RIGHT ARM  Final   Special Requests   Final    BOTTLES DRAWN AEROBIC ONLY Blood Culture results may not be optimal due to an inadequate volume of blood received in culture bottles   Culture   Final    NO GROWTH 5 DAYS Performed at [facility], [place] [address], [place], [place] [address]    Report Status 10/17/2021 FINAL  Final  Culture, blood (routine x 2)     Status: None   Collection Time: 10/12/21  2:55 PM   Specimen: BLOOD RIGHT HAND  Result Value Ref Range Status   Specimen Description BLOOD RIGHT HAND  Final   Special Requests   Final    BOTTLES DRAWN AEROBIC AND ANAEROBIC Blood Culture adequate volume   Culture   Final    NO GROWTH 5 DAYS Performed at [facility], [address], [place], [place] [address]    Report Status 10/17/2021 FINAL  Final     Time coordinating discharge: Over 30 minutes  SIGNED:   Phillips Climes, MD  [facility] 10/17/2021, 12:06 PM Pager   If 7PM-7AM, please contact night-coverage [email or web address] Password [email or web address]

## 2021-10-17 NOTE — Progress Notes (Signed)
Patient given discharge instructions and stated understanding.  Patient has received his TOC medication.

## 2021-10-17 NOTE — Discharge Instructions (Signed)
Follow with Primary MD Camillia Herter, NP in 7 days   Get CBC, CMP, checked  by Primary MD next visit.    Activity: As tolerated with Full fall precautions use walker/cane & assistance as needed   Disposition Home    Diet: Heart Healthy  .  On your next visit with your primary care physician please Get Medicines reviewed and adjusted.   Please request your Prim.MD to go over all Hospital Tests and Procedure/Radiological results at the follow up, please get all Hospital records sent to your Prim MD by signing hospital release before you go home.   If you experience worsening of your admission symptoms, develop shortness of breath, life threatening emergency, suicidal or homicidal thoughts you must seek medical attention immediately by calling 911 or calling your MD immediately  if symptoms less severe.  You Must read complete instructions/literature along with all the possible adverse reactions/side effects for all the Medicines you take and that have been prescribed to you. Take any new Medicines after you have completely understood and accpet all the possible adverse reactions/side effects.   Do not drive, operating heavy machinery, perform activities at heights, swimming or participation in water activities or provide baby sitting services if your were admitted for syncope or siezures until you have seen by Primary MD or a Neurologist and advised to do so again.  Do not drive when taking Pain medications.    Do not take more than prescribed Pain, Sleep and Anxiety Medications  Special Instructions: If you have smoked or chewed Tobacco  in the last 2 yrs please stop smoking, stop any regular Alcohol  and or any Recreational drug use.  Wear Seat belts while driving.   Please note  You were cared for by a hospitalist during your hospital stay. If you have any questions about your discharge medications or the care you received while you were in the hospital after you are  discharged, you can call the unit and asked to speak with the hospitalist on call if the hospitalist that took care of you is not available. Once you are discharged, your primary care physician will handle any further medical issues. Please note that NO REFILLS for any discharge medications will be authorized once you are discharged, as it is imperative that you return to your primary care physician (or establish a relationship with a primary care physician if you do not have one) for your aftercare needs so that they can reassess your need for medications and monitor your lab values.

## 2021-10-17 NOTE — TOC Transition Note (Addendum)
Transition of Care [facility]) - CM/SW Discharge Note   Patient Details  Name: Martin [name] MRN: [id number] Date of Birth: Apr 19, 1961  [facility] CM/SW Contact:  Verdell Carmine, RN Phone Number: 10/17/2021, 12:03 PM   Clinical Narrative:    Elesa Hacker to patient via phone. He has transportation home. DME RW and shower stooll ordered via [employer] . Patient is approving of Home Health. Discussed Medicare ratings system and [place] agencies. He would like a [place] agency that is highly rated.  Reached out to Nanine Means [facility]) for referral. Declined [facility]  Accepted Adela Lank for PT OT   Final next level of care: [place] Barriers to Discharge: No Barriers Identified   Patient Goals and CMS Choice     Choice offered to / list presented to : Patient  Discharge Placement               Home with [place] [facility]        Discharge Plan and Services In-house Referral: Clinical Social Work              DME Arranged: Shower stool, [place] Rile DME Agency: [employer] Date DME Agency Contacted: 10/17/21 Time DME Agency Contacted: 1202 Representative spoke with at DME Agency: Freda Munro HH Arranged: PT, OT          Social Determinants of Health (SDOH) Interventions     Readmission Risk Interventions No flowsheet data found.

## 2021-10-17 NOTE — Progress Notes (Signed)
Martin [name] for Infectious Disease  Date of Admission:  10/11/2021     Total days of antibiotics 7         ASSESSMENT:  Martin [name] TEE was negative for endocarditis. Blood cultures from 2/4 have remained without growth to date. We will continue with plan for treatment of simple bacteremia and treatment with linezolid for 10 days. Will arrange follow up in ID office. [place] for discharge from ID standpoint and TOC to bring medications to bedside. Remaining medical and supportive care per primary team.   ID will sign off.   PLAN:  Change antibiotics to PO linezolid at discharge for 10 days. Medications to filled by [facility] pharmacy.  Arrange follow up in ID office.  [place] for discharge from ID standpoint.   Principal Problem:   MSSA bacteremia Active Problems:   Anal cancer   Vitamin B12 deficiency   Chronic pain syndrome   Hyperlipidemia   Sepsis   Peripheral vascular disease   AKI   Tobacco use   Emphysema   BPH    Periodontal disease   IVDU, cocaine use   Demand ischemia   Thrombocytopenia   Normocytic anemia    apixaban  2.5 mg Oral BID   clopidogrel  75 mg Oral Daily   cyanocobalamin  1,000 mcg Intramuscular Daily   Followed by   Derrill Memo ON 10/20/2021] cyanocobalamin  1,000 mcg Intramuscular Weekly   Followed by   Derrill Memo ON 11/17/2021] cyanocobalamin  1,000 mcg Intramuscular X73 days   folic acid  1 mg Oral Daily   methadone  115 mg Oral Daily   multivitamin with minerals  1 tablet Oral Daily   nicotine  21 mg Transdermal Daily   sodium chloride flush  3 mL Intravenous Q12H   tamsulosin  0.4 mg Oral QHS   thiamine  100 mg Oral Daily    SUBJECTIVE:  Afebrile overnight with no acute events. No new concerns/complaints. Working with physical therapy.   Allergies  Allergen Reactions   Yellow Jacket Venom [Bee Venom] Anaphylaxis   Aspirin Swelling   Codeine Nausea Only   Ibuprofen     On blood thinners   Morphine And Related Itching     Review of  Systems: Review of Systems  Constitutional:  Negative for chills, fever and weight loss.  Respiratory:  Negative for cough, shortness of breath and wheezing.   Cardiovascular:  Negative for chest pain and leg swelling.  Gastrointestinal:  Negative for abdominal pain, constipation, diarrhea, nausea and vomiting.  Skin:  Negative for rash.     OBJECTIVE: Vitals:   10/16/21 2241 10/17/21 0034 10/17/21 0400 10/17/21 0757  BP: (!) 125/54 (!) 122/54 (!) 111/46 (!) 130/43  Pulse: 62 73 60 66  Resp: 20 15 19 16   Temp: 98.2 F (36.8 C) 98.1 F (36.7 C) 98 F (36.7 C) 98.4 F (36.9 C)  TempSrc: Oral Oral Oral Oral  SpO2: 97% 97% 97% 97%  Weight:      Height:       Body mass index is 19.28 kg/m.  Physical Exam Constitutional:      General: He is not in acute distress.    Appearance: He is well-developed.  Cardiovascular:     Rate and Rhythm: Normal rate and regular rhythm.     Heart sounds: Normal heart sounds.  Pulmonary:     Effort: Pulmonary effort is normal.     Breath sounds: Normal breath sounds.  Skin:    General: Skin is  warm and dry.  Neurological:     Mental Status: He is alert and oriented to person, place, and time.  Psychiatric:        Behavior: Behavior normal.        Thought Content: Thought content normal.        Judgment: Judgment normal.    Lab Results Lab Results  Component Value Date   WBC 7.8 10/16/2021   HGB 10.3 (L) 10/16/2021   HCT 30.3 (L) 10/16/2021   MCV 87.6 10/16/2021   PLT 107 (L) 10/16/2021    Lab Results  Component Value Date   CREATININE 0.78 10/16/2021   BUN 11 10/16/2021   NA 133 (L) 10/16/2021   K 3.9 10/16/2021   CL 99 10/16/2021   CO2 25 10/16/2021    Lab Results  Component Value Date   ALT 15 10/11/2021   AST 26 10/11/2021   ALKPHOS 84 10/11/2021   BILITOT 0.9 10/11/2021     Microbiology: Recent Results (from the past 240 hour(s))  Resp Panel by RT-PCR (Flu A&B, Covid)     Status: None   Collection Time:  10/11/21  3:23 PM   Specimen: Nasopharyngeal(NP) swabs in vial transport medium  Result Value Ref Range Status   SARS Coronavirus 2 by RT PCR NEGATIVE NEGATIVE Final    Comment: (NOTE) SARS-CoV-2 target nucleic acids are NOT DETECTED.  The SARS-CoV-2 RNA is generally detectable in upper respiratory specimens during the acute phase of infection. The lowest concentration of SARS-CoV-2 viral copies this assay can detect is 138 copies/mL. A negative result does not preclude SARS-Cov-2 infection and should not be used as the sole basis for treatment or other patient management decisions. A negative result may occur with  improper specimen collection/handling, submission of specimen other than nasopharyngeal swab, presence of viral mutation(s) within the areas targeted by this assay, and inadequate number of viral copies(<138 copies/mL). A negative result must be combined with clinical observations, patient history, and epidemiological information. The expected result is Negative.  Fact Sheet for Patients:  EntrepreneurPulse.com.au  Fact Sheet for Healthcare Providers:  IncredibleEmployment.be  This test is no t yet approved or cleared by the [place] FDA and  has been authorized for detection and/or diagnosis of SARS-CoV-2 by FDA under an Emergency Use Authorization (EUA). This EUA will remain  in effect (meaning this test can be used) for the duration of the COVID-19 declaration under Section 564(b)(1) of the Act, 21 U.S.C.section 360bbb-3(b)(1), unless the authorization is terminated  or revoked sooner.       Influenza A by PCR NEGATIVE NEGATIVE Final   Influenza B by PCR NEGATIVE NEGATIVE Final    Comment: (NOTE) The Xpert Xpress SARS-CoV-2/FLU/RSV plus assay is intended as an aid in the diagnosis of influenza from Nasopharyngeal swab specimens and should not be used as a sole basis for treatment. Nasal washings and aspirates are  unacceptable for Xpert Xpress SARS-CoV-2/FLU/RSV testing.  Fact Sheet for Patients: EntrepreneurPulse.com.au  Fact Sheet for Healthcare Providers: IncredibleEmployment.be  This test is not yet approved or cleared by the [place] FDA and has been authorized for detection and/or diagnosis of SARS-CoV-2 by FDA under an Emergency Use Authorization (EUA). This EUA will remain in effect (meaning this test can be used) for the duration of the COVID-19 declaration under Section 564(b)(1) of the Act, 21 U.S.C. section 360bbb-3(b)(1), unless the authorization is terminated or revoked.  Performed at [facility], [place] [address] [place], [place] [address]   Culture,  blood (routine x 2)     Status: Abnormal   Collection Time: 10/11/21  3:30 PM   Specimen: BLOOD LEFT HAND  Result Value Ref Range Status   Specimen Description BLOOD LEFT HAND  Final   Special Requests   Final    BOTTLES DRAWN AEROBIC AND ANAEROBIC Blood Culture adequate volume   Culture  Setup Time   Final    GRAM POSITIVE COCCI IN CLUSTERS IN BOTH AEROBIC AND ANAEROBIC BOTTLES CRITICAL RESULT CALLED TO, READ BACK BY AND VERIFIED WITH: V BRYK,PHARMD@0523  10/12/21 [place] Performed at [facility], [place] [address] [place], [place] [address]    Culture STAPHYLOCOCCUS AUREUS (A)  Final   Report Status 10/14/2021 FINAL  Final   Organism ID, Bacteria STAPHYLOCOCCUS AUREUS  Final      Susceptibility   Staphylococcus aureus - MIC*    CIPROFLOXACIN <=0.5 SENSITIVE Sensitive     ERYTHROMYCIN <=0.25 SENSITIVE Sensitive     GENTAMICIN <=0.5 SENSITIVE Sensitive     OXACILLIN <=0.25 SENSITIVE Sensitive     TETRACYCLINE <=1 SENSITIVE Sensitive     VANCOMYCIN 1 SENSITIVE Sensitive     TRIMETH/SULFA <=10 SENSITIVE Sensitive     CLINDAMYCIN <=0.25 SENSITIVE Sensitive     RIFAMPIN <=0.5 SENSITIVE Sensitive     Inducible Clindamycin NEGATIVE Sensitive     * STAPHYLOCOCCUS AUREUS  Blood  Culture ID Panel (Reflexed)     Status: Abnormal   Collection Time: 10/11/21  3:30 PM  Result Value Ref Range Status   Enterococcus faecalis NOT DETECTED NOT DETECTED Final   Enterococcus Faecium NOT DETECTED NOT DETECTED Final   Listeria monocytogenes NOT DETECTED NOT DETECTED Final   Staphylococcus species DETECTED (A) NOT DETECTED Final    Comment: CRITICAL RESULT CALLED TO, READ BACK BY AND VERIFIED WITH: V BRYK,PHARMD@0523  10/12/21 [place]    Staphylococcus aureus (BCID) DETECTED (A) NOT DETECTED Final    Comment: CRITICAL RESULT CALLED TO, READ BACK BY AND VERIFIED WITH: V BRYK,PHARMD@0523  10/12/21 [place]    Staphylococcus epidermidis NOT DETECTED NOT DETECTED Final   Staphylococcus lugdunensis NOT DETECTED NOT DETECTED Final   Streptococcus species NOT DETECTED NOT DETECTED Final   Streptococcus agalactiae NOT DETECTED NOT DETECTED Final   Streptococcus pneumoniae NOT DETECTED NOT DETECTED Final   Streptococcus pyogenes NOT DETECTED NOT DETECTED Final   A.calcoaceticus-baumannii NOT DETECTED NOT DETECTED Final   Bacteroides fragilis NOT DETECTED NOT DETECTED Final   Enterobacterales NOT DETECTED NOT DETECTED Final   Enterobacter cloacae complex NOT DETECTED NOT DETECTED Final   Escherichia coli NOT DETECTED NOT DETECTED Final   Klebsiella aerogenes NOT DETECTED NOT DETECTED Final   Klebsiella oxytoca NOT DETECTED NOT DETECTED Final   Klebsiella pneumoniae NOT DETECTED NOT DETECTED Final   Proteus species NOT DETECTED NOT DETECTED Final   Salmonella species NOT DETECTED NOT DETECTED Final   Serratia marcescens NOT DETECTED NOT DETECTED Final   Haemophilus influenzae NOT DETECTED NOT DETECTED Final   Neisseria meningitidis NOT DETECTED NOT DETECTED Final   Pseudomonas aeruginosa NOT DETECTED NOT DETECTED Final   Stenotrophomonas maltophilia NOT DETECTED NOT DETECTED Final   Candida albicans NOT DETECTED NOT DETECTED Final   Candida auris NOT DETECTED NOT DETECTED Final   Candida  glabrata NOT DETECTED NOT DETECTED Final   Candida krusei NOT DETECTED NOT DETECTED Final   Candida parapsilosis NOT DETECTED NOT DETECTED Final   Candida tropicalis NOT DETECTED NOT DETECTED Final   Cryptococcus neoformans/gattii NOT DETECTED NOT DETECTED Final   Meth resistant mecA/C  and MREJ NOT DETECTED NOT DETECTED Final    Comment: Performed at [facility], [place] [address], [place], [place] [address]  Culture, blood (routine x 2)     Status: Abnormal   Collection Time: 10/11/21  3:35 PM   Specimen: BLOOD LEFT FOREARM  Result Value Ref Range Status   Specimen Description BLOOD LEFT FOREARM  Final   Special Requests   Final    BOTTLES DRAWN AEROBIC AND ANAEROBIC Blood Culture adequate volume   Culture  Setup Time   Final    GRAM POSITIVE COCCI IN CLUSTERS IN BOTH AEROBIC AND ANAEROBIC BOTTLES CRITICAL VALUE NOTED.  VALUE IS CONSISTENT WITH PREVIOUSLY REPORTED AND CALLED VALUE.    Culture (A)  Final    STAPHYLOCOCCUS AUREUS SUSCEPTIBILITIES PERFORMED ON PREVIOUS CULTURE WITHIN THE LAST 5 DAYS. Performed at [facility], [place] [address], [place], [place] [address]    Report Status 10/14/2021 FINAL  Final  Culture, blood (routine x 2)     Status: None (Preliminary result)   Collection Time: 10/12/21  2:42 PM   Specimen: BLOOD RIGHT ARM  Result Value Ref Range Status   Specimen Description BLOOD RIGHT ARM  Final   Special Requests   Final    BOTTLES DRAWN AEROBIC ONLY Blood Culture results may not be optimal due to an inadequate volume of blood received in culture bottles   Culture   Final    NO GROWTH 4 DAYS Performed at [facility], [place] [address] [place], [place] [address]    Report Status PENDING  Incomplete  Culture, blood (routine x 2)     Status: None (Preliminary result)   Collection Time: 10/12/21  2:55 PM   Specimen: BLOOD RIGHT HAND  Result Value Ref Range Status   Specimen Description BLOOD RIGHT HAND  Final   Special Requests   Final     BOTTLES DRAWN AEROBIC AND ANAEROBIC Blood Culture adequate volume   Culture   Final    NO GROWTH 4 DAYS Performed at [name], [place] [address], [place], [place] [address]    Report Status PENDING  Incomplete     Terri Piedra, NP [facility] for Infectious Disease [facility]  10/17/2021  10:46 AM

## 2021-10-19 ENCOUNTER — Telehealth: Payer: Self-pay

## 2021-10-19 ENCOUNTER — Encounter (HOSPITAL_COMMUNITY): Payer: Self-pay | Admitting: Internal Medicine

## 2021-10-19 NOTE — Telephone Encounter (Signed)
Transition Care Management Follow-up Telephone Call   Date of discharge and from where:[facility] on 10/16/2021 How have you been since you were released from the hospital? ok Any questions or concerns? No questions/concerns reported.  Items Reviewed: Did the pt receive and understand the discharge instructions provided? have the instructions and have no questions.  Medications obtained and verified? He said that they have the medication list  from the hospital but he has to pick up some more from the pharmacy. He said that he has all of the medications and they have no questions.  Any new allergies since your discharge? None reported  Do you have support at home? Yes, daughter, nephew  Other (ie: DME, Home Health, etc) DME Arranged: Shower stool, Walker and PT       Functional Questionnaire: (I = Independent and D = Dependent) ADL's:  Independent.        Follow up appointments reviewed:   PCP Hospital f/u appt confirmed? NP Amy Minette Brine on 10/21/2021@ [facility] f/u appt confirmed?  scheduled at this time  Are transportation arrangements needed? have transportation   If their condition worsens, is the pt aware to call  their PCP or go to the ED? YES Was the patient provided with contact information for the PCP's office or ED? He has the phone number  Was the pt encouraged to call back with questions or concerns?yes

## 2021-10-20 NOTE — Progress Notes (Signed)
Erroneous encounter

## 2021-10-21 ENCOUNTER — Encounter: Payer: Medicare Other | Admitting: Family

## 2021-10-21 DIAGNOSIS — J439 Emphysema, unspecified: Secondary | ICD-10-CM

## 2021-10-21 DIAGNOSIS — E538 Deficiency of other specified B group vitamins: Secondary | ICD-10-CM

## 2021-10-21 DIAGNOSIS — N400 Enlarged prostate without lower urinary tract symptoms: Secondary | ICD-10-CM

## 2021-10-21 DIAGNOSIS — N179 Acute kidney failure, unspecified: Secondary | ICD-10-CM

## 2021-10-21 DIAGNOSIS — B9561 Methicillin susceptible Staphylococcus aureus infection as the cause of diseases classified elsewhere: Secondary | ICD-10-CM

## 2021-10-21 DIAGNOSIS — I739 Peripheral vascular disease, unspecified: Secondary | ICD-10-CM

## 2021-10-21 DIAGNOSIS — I248 Other forms of acute ischemic heart disease: Secondary | ICD-10-CM

## 2021-10-21 DIAGNOSIS — D696 Thrombocytopenia, unspecified: Secondary | ICD-10-CM

## 2021-10-21 DIAGNOSIS — G894 Chronic pain syndrome: Secondary | ICD-10-CM

## 2021-10-21 DIAGNOSIS — Z720 Tobacco use: Secondary | ICD-10-CM

## 2021-10-21 DIAGNOSIS — Z09 Encounter for follow-up examination after completed treatment for conditions other than malignant neoplasm: Secondary | ICD-10-CM

## 2021-10-21 DIAGNOSIS — A4101 Sepsis due to Methicillin susceptible Staphylococcus aureus: Secondary | ICD-10-CM

## 2021-10-21 DIAGNOSIS — D649 Anemia, unspecified: Secondary | ICD-10-CM

## 2021-10-21 DIAGNOSIS — F1410 Cocaine abuse, uncomplicated: Secondary | ICD-10-CM

## 2021-10-21 DIAGNOSIS — C210 Malignant neoplasm of anus, unspecified: Secondary | ICD-10-CM

## 2021-10-21 DIAGNOSIS — K056 Periodontal disease, unspecified: Secondary | ICD-10-CM

## 2021-10-29 ENCOUNTER — Ambulatory Visit (INDEPENDENT_AMBULATORY_CARE_PROVIDER_SITE_OTHER): Payer: Medicare Other | Admitting: Family

## 2021-10-29 ENCOUNTER — Encounter: Payer: Self-pay | Admitting: Family

## 2021-10-29 ENCOUNTER — Other Ambulatory Visit: Payer: Self-pay

## 2021-10-29 VITALS — BP 122/64 | HR 67 | Temp 98.4°F | Wt 139.0 lb

## 2021-10-29 DIAGNOSIS — B9561 Methicillin susceptible Staphylococcus aureus infection as the cause of diseases classified elsewhere: Secondary | ICD-10-CM | POA: Diagnosis not present

## 2021-10-29 DIAGNOSIS — R7881 Bacteremia: Secondary | ICD-10-CM

## 2021-10-29 LAB — CBC WITH DIFFERENTIAL/PLATELET
Absolute Monocytes: 832 cells/uL (ref 200–950)
Basophils Absolute: 104 cells/uL (ref 0–200)
Basophils Relative: 1 %
Eosinophils Absolute: 166 cells/uL (ref 15–500)
Eosinophils Relative: 1.6 %
HCT: 34.3 % — ABNORMAL LOW (ref 38.5–50.0)
Hemoglobin: 11.6 g/dL — ABNORMAL LOW (ref 13.2–17.1)
Lymphs Abs: 2413 cells/uL (ref 850–3900)
MCH: 29.7 pg (ref 27.0–33.0)
MCHC: 33.8 g/dL (ref 32.0–36.0)
MCV: 87.7 fL (ref 80.0–100.0)
MPV: 9.8 fL (ref 7.5–12.5)
Monocytes Relative: 8 %
Neutro Abs: 6885 cells/uL (ref 1500–7800)
Neutrophils Relative %: 66.2 %
Platelets: 166 10*3/uL (ref 140–400)
RBC: 3.91 10*6/uL — ABNORMAL LOW (ref 4.20–5.80)
RDW: 13.8 % (ref 11.0–15.0)
Total Lymphocyte: 23.2 %
WBC: 10.4 10*3/uL (ref 3.8–10.8)

## 2021-10-29 NOTE — Assessment & Plan Note (Signed)
Martin [name] has completed treatment for suspected simple MSSA bacteremia with completion of his Linezolid 2 days ago. Check CBC to check platelets today. Feeling better with no indications for further infection. No further antibiotics are needed at this time. Reviewed signs/symptoms of infection and when to seek further care. Follow up with ID as needed pending lab work results.

## 2021-10-29 NOTE — Progress Notes (Signed)
Subjective:    Patient ID: Martin [name], male    DOB: [birth year], 61 y.o.   MRN: [id number]  Chief Complaint  Patient presents with   Hospitalization Follow-up    HPI:  Martin [name] is a 61 y.o. male with previous medical history of anal cancer, chemoradiation, and PVD s/p lower extremity stents and recent hospitalization with MSSA bacteremia presenting today for hospitalization follow up.  Martin [name] was recently admitted to [facility] for fever, weakness and fatigue and found to have the MSSA bacteremia. Cultures cleared quickly and TTE and TEE were negative for endocarditis. Treated for simple bacteremia with 10 days of linezolid.  Martin [name] has taken his Linezolid as prescribed with no adverse side effects although has noted he has some increased in bleeding. He completed his medication 2 days ago. Has concerns about how he got this infection and about some of the results from the hospital    Allergies  Allergen Reactions   Yellow Jacket Venom [Bee Venom] Anaphylaxis   Aspirin Swelling   Codeine Nausea Only   Ibuprofen     On blood thinners   Morphine And Related Itching      Outpatient Medications Prior to Visit  Medication Sig Dispense Refill   acetaminophen (TYLENOL) 325 MG tablet Take 650 mg by mouth every 6 (six) hours as needed for moderate pain.     albuterol (VENTOLIN HFA) 108 (90 Base) MCG/ACT inhaler Inhale 2 puffs into the lungs every 6 (six) hours as needed for wheezing or shortness of breath. 18 g 2   apixaban (ELIQUIS) 2.5 MG TABS tablet Take 1 tablet (2.5 mg total) by mouth 2 (two) times daily. 60 tablet 3   clopidogrel (PLAVIX) 75 MG tablet Take 1 tablet (75 mg total) by mouth daily. 30 tablet 5   folic acid (FOLVITE) 1 MG tablet Take 1 tablet (1 mg total) by mouth daily.     methadone (DOLOPHINE) 10 MG/ML solution Take 115 mg by mouth daily. Confirmed with [facility] 10/12/21     tamsulosin (FLOMAX) 0.4 MG CAPS capsule Take 1  capsule (0.4 mg total) by mouth at bedtime. 30 capsule 2   vitamin B-12 (CYANOCOBALAMIN) 1000 MCG tablet Take 1 tablet (1,000 mcg total) by mouth daily. 30 tablet 0   atorvastatin (LIPITOR) 40 MG tablet Take 1 tablet (40 mg total) by mouth daily. (Patient not taking: Reported on 10/11/2021) 120 tablet 0   EPINEPHrine (EPIPEN 2-PAK) 0.3 mg/0.3 mL IJ SOAJ injection Inject 0.3 mLs (0.3 mg total) into the muscle as needed for anaphylaxis. (Patient not taking: Reported on 10/11/2021) 1 each 0   nicotine (NICODERM CQ) 14 mg/24hr patch Place 1 patch (14 mg total) onto the skin daily. (Patient not taking: Reported on 06/28/2021) 28 patch 3   umeclidinium-vilanterol (ANORO ELLIPTA) 62.5-25 MCG/INH AEPB Inhale 1 puff into the lungs daily. (Patient not taking: Reported on 10/11/2021) 60 each 2   No facility-administered medications prior to visit.     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    Anxiety    Arthritis    DDD lumbar, arthritis knees   DJD (degenerative joint disease) of lumbar spine    GERD (gastroesophageal reflux disease)    Hemorrhoid    internal   History of bowel resection    Hypertension    EKG 12/12 EPIC   no  PCP-  states increased lately but hasnt been diagnosed formally   Inguinal hernia    Peripheral vascular disease ([place])    Pneumonia    as child, cough at present with no fever   Pneumonia    as child, cough at present with no fever    Radiation 11/19/11-01/08/12   5040 cGy 28 fx Pelvis and inguinal area     Past Surgical History:  Procedure Laterality Date   APPENDECTOMY     age 57 or 63   EXAMINATION UNDER ANESTHESIA  10/14/2011   Procedure: EXAM UNDER ANESTHESIA;  Surgeon: Earnstine Regal, MD;  Location: [facility];  Service: General;  Laterality: N/A;  exam under anethesia, Excision of mass anal canal, 1.5cm   INGUINAL HERNIA REPAIR  05/11/2012   Procedure: HERNIA REPAIR INGUINAL  ADULT;  Surgeon: Earnstine Regal, MD;  Location: [place];  Service: General;  Laterality: Left;  left inguinal hernia repair with mesh   LOWER EXTREMITY ANGIOGRAPHY Left 07/02/2021   Procedure: LOWER EXTREMITY ANGIOGRAPHY;  Surgeon: Katha Cabal, MD;  Location: [place] [facility];  Service: Cardiovascular;  Laterality: Left;   LOWER EXTREMITY ANGIOGRAPHY Right 07/16/2021   Procedure: LOWER EXTREMITY ANGIOGRAPHY;  Surgeon: Katha Cabal, MD;  Location: [place] [facility];  Service: Cardiovascular;  Laterality: Right;   LOWER EXTREMITY ANGIOGRAPHY Left 08/27/2021   Procedure: LOWER EXTREMITY ANGIOGRAPHY;  Surgeon: Katha Cabal, MD;  Location: [place] [facility];  Service: Cardiovascular;  Laterality: Left;   surgical pathology   10/14/2011   squamous cell ca of anus   TEE WITHOUT CARDIOVERSION N/A 10/16/2021   Procedure: TRANSESOPHAGEAL ECHOCARDIOGRAM (TEE);  Surgeon: [name], MD;  Location: [facility];  Service: Cardiovascular;  Laterality: N/A;   transanal excision  10/14/2011   Dr.Todd Gerkin       Review of Systems  Constitutional:  Negative for chills, diaphoresis, fatigue and fever.  Respiratory:  Negative for cough, chest tightness, shortness of breath and wheezing.   Cardiovascular:  Negative for chest pain.  Gastrointestinal:  Negative for abdominal pain, diarrhea, nausea and vomiting.     Objective:    BP 122/64    Pulse 67    Temp 98.4 F (36.9 C) (Temporal)    Wt 139 lb (63 kg)    BMI 19.39 kg/m  Nursing note and vital signs reviewed.  Physical Exam Constitutional:      General: He is not in acute distress.    Appearance: He is well-developed.  Cardiovascular:     Rate and Rhythm: Normal rate and regular rhythm.     Heart sounds: Normal heart sounds.  Pulmonary:     Effort: Pulmonary effort is normal.     Breath sounds: Normal breath sounds.  Skin:    General: Skin is warm and dry.  Neurological:     Mental Status: He is  alert and oriented to person, place, and time.  Psychiatric:        Behavior: Behavior normal.        Thought Content: Thought content normal.        Judgment: Judgment normal.     Depression screen [facility] 2/9 10/29/2021 06/06/2021 01/15/2017 07/05/2015 11/02/2014  Decreased Interest 0 0 0 0 0  Down, Depressed, Hopeless 1 0 0 0 0  PHQ - 2 Score 1 0 0 0 0  Some recent data might be hidden       Assessment & Plan:    Patient Active Problem List  Diagnosis Date Noted   Sepsis 10/12/2021   Peripheral vascular disease    AKI    Tobacco use    Emphysema    BPH     Periodontal disease    IVDU, cocaine use    Demand ischemia    Thrombocytopenia    Normocytic anemia    MSSA bacteremia 10/11/2021   Callus 06/12/2021   Hav (hallux abducto valgus), unspecified laterality 06/12/2021   Hammer toes, bilateral 06/12/2021   Atherosclerosis of native arteries of extremity with intermittent claudication ([place]) 06/12/2021   Hyperlipidemia 06/07/2021   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   Elevated blood-pressure reading, without diagnosis of hypertension 09/26/2020   Body mass index (BMI) 29.0-29.9, adult 08/29/2020   Partial small bowel obstruction ([place]) 03/10/2020   Small bowel obstruction due to adhesions ([place]) 03/08/2020   Enteritis 10/15/2019   Abnormal liver function 10/15/2019   Leukocytosis 10/15/2019   Nausea & vomiting 10/15/2019   GAD (generalized anxiety disorder) 05/18/2017   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   Constipation 04/11/2017   Bee sting 04/05/2017   Diverticulosis 11/29/2014   Healthcare maintenance 11/06/2014   Cough 10/05/2014   Insomnia 09/13/2014   Erectile dysfunction 11/30/2013   Chronic pain syndrome 11/30/2013   Depression 11/30/2013   GERD (gastroesophageal reflux disease)    Hypertension    Anxiety    Arthritis    Osteoarthritis of lumbar spine    Radiation    Night sweats 08/12/2012   Tobacco abuse counseling 05/26/2012    Lymphocytic colitis 05/03/2012   Vitamin B12 deficiency 03/19/2012   Folate deficiency 03/19/2012   Inguinal hernia 11/24/2011   Cancer ([place]) 10/14/2011   Internal hemorrhoid 09/11/2011   Anal cancer 10/09/2010     Problem List Items Addressed This Visit       Other   MSSA bacteremia - Primary    Martin [name] has completed treatment for suspected simple MSSA bacteremia with completion of his Linezolid 2 days ago. Check CBC to check platelets today. Feeling better with no indications for further infection. No further antibiotics are needed at this time. Reviewed signs/symptoms of infection and when to seek further care. Follow up with ID as needed pending lab work results.       Relevant Orders   CBC with Differential/Platelet     I am having Martin [name] maintain his EPINEPHrine, umeclidinium-vilanterol, atorvastatin, nicotine, albuterol, clopidogrel, apixaban, tamsulosin, acetaminophen, methadone, folic acid, and vitamin B-12.   Follow-up: As needed pending lab work results.    Terri Piedra, MSN, FNP-C Nurse Practitioner [facility] for Infectious Disease [place] number: [phone]

## 2021-10-29 NOTE — Patient Instructions (Signed)
Nice to see you.  We will check your blood work today.   No further antibiotics are needed at this time.   Monitor for signs of new infection include fevers, chills, night sweats, or pain similar to what you have had in the past.  Follow up with ID pending lab work results for possible lab visit.   Hope you are feeling better!

## 2021-10-31 ENCOUNTER — Telehealth: Payer: Self-pay

## 2021-10-31 NOTE — Telephone Encounter (Signed)
Patient aware of results and to follow up with ID prn. Patient voiced his understanding.   [place], CMA

## 2021-10-31 NOTE — Telephone Encounter (Signed)
-----   Message from [place], [place] sent at 10/30/2021  5:34 PM EST ----- Please inform [name] that his lab work looks good and there is no need for any additional lab work. Follow up with ID as needed.

## 2021-11-18 ENCOUNTER — Other Ambulatory Visit: Payer: Self-pay | Admitting: Family Medicine

## 2021-11-18 ENCOUNTER — Other Ambulatory Visit: Payer: Self-pay | Admitting: Family

## 2021-11-18 ENCOUNTER — Other Ambulatory Visit (INDEPENDENT_AMBULATORY_CARE_PROVIDER_SITE_OTHER): Payer: Self-pay | Admitting: Vascular Surgery

## 2021-12-02 NOTE — H&P (Signed)
?  Patient: Martin [name]  PID: [id number]  DOB: 1961/03/18  SEX: Male  ? ?Patient referred by DDS for extraction all remaining teeth ? ?CC: Painful teeth ? ?Past Medical History:  Cancer, COPD, Smoker, Bruise Easily, Immune System Problem, Arthritis   ? ?Medications: Flomax, Albuterol, Methadone   ? ?Allergies:     Aspirin   ? ?Surgeries:   intestinal surgery, Tumor Removal, Colonoscopy    ? ?Social History       ?Smoking:  1 ppd          ?Alcohol:n ?Drug use:  h/o Percocet abuse          ?                 ?Exam: BMI  20, Gross decay all remaining teeth. Small palatal torus.  No purulence, edema, fluctuance, trismus. Oral cancer screening negative. Pharynx clear. No lymphadenopathy. ? ?Panorex: Gross decay all remaining teeth # 1, 3, 6, 11, 12, 14, 15, 17, 19, 21, 22, 23, 24, 25, 26, 27, 28, 29, 31.  ? ?Assessment: ASA 3. Non-restorable  1, 3, 6, 11, 12, 14, 15, 17, 19, 21, 22, 23, 24, 25, 26, 27, 28, 29, 31.              ? ?Plan: 1. MD clearance -done ? 2. Extraction Teeth #  1, 3, 6, 11, 12, 14, 15, 17, 19, 21, 22, 23, 24, 25, 26, 27, 28, 29, 31.   Hospital Day surgery.                ? ?Rx: n              ? ?Risks and complications explained. Questions answered.  ? ?Gae Bon, DMD ? ?

## 2021-12-03 ENCOUNTER — Other Ambulatory Visit: Payer: Self-pay

## 2021-12-03 ENCOUNTER — Encounter (HOSPITAL_COMMUNITY): Payer: Self-pay | Admitting: Oral Surgery

## 2021-12-03 NOTE — Anesthesia Preprocedure Evaluation (Addendum)
Anesthesia Evaluation  ?Patient identified by MRN, date of birth, ID band ?Patient awake ? ? ? ?Reviewed: ?Allergy & Precautions, NPO status , Patient's Chart, lab work & pertinent test results ? ?History of Anesthesia Complications ?Negative for: history of anesthetic complications ? ?Airway ?Mallampati: I ? ?TM Distance: >3 FB ?Neck ROM: Full ? ? ? Dental ? ?(+) Poor Dentition, Missing, Loose, Chipped, Dental Advisory Given ?  ?Pulmonary ?COPD, Current SmokerPatient did not abstain from smoking.,  ?  ?breath sounds clear to auscultation ? ? ? ? ? ? Cardiovascular ?hypertension, + Peripheral Vascular Disease  ?(-) CAD  ?Rhythm:Regular Rate:Normal ? ?TEE 10/16/21: ?IMPRESSIONS  ??1. Left ventricular ejection fraction, by estimation, is 55 to 60%. The  ?left ventricle has normal function. The left ventricle has no regional  ?wall motion abnormalities. There is mild left ventricular hypertrophy.  ??2. Right ventricular systolic function is normal. The right ventricular  ?size is normal.  ??3. Left atrial size was mildly dilated. No left atrial/left atrial  ?appendage thrombus was detected. The LAA emptying velocity was 62 cm/s.  ??4. The mitral valve is degenerative. Mild mitral valve regurgitation.  ??5. No vegetation. The aortic valve is tricuspid. Aortic valve  ?regurgitation moderate central. No aortic stenosis is present.  ?- Conclusion(s)/Recommendation(s): No evidence of vegetation/infective  ?endocarditis on this transesophageael echocardiogram.  ?  ?Neuro/Psych ?Anxiety Depression CVA, No Residual Symptoms   ? GI/Hepatic ?GERD  Controlled,(+)  ?  ? substance abuse (methadone) ? cocaine use, marijuana use and IV drug use,   ?Endo/Other  ?negative endocrine ROS ? Renal/GU ?Renal InsufficiencyRenal disease  ? ?  ?Musculoskeletal ? ?(+) narcotic dependent ? Abdominal ?  ?Peds ? Hematology ?Plavix ?Eliquis: last dose sat   ?Anesthesia Other Findings ? ? Reproductive/Obstetrics ? ?   ? ? ? ? ? ? ? ? ? ? ? ? ? ?  ?  ? ? ? ? ? ? ?Anesthesia Physical ?Anesthesia Plan ? ?ASA: 3 ? ?Anesthesia Plan: General  ? ?Post-op Pain Management: Tylenol PO (pre-op)*  ? ?Induction: Intravenous ? ?PONV Risk Score and Plan: 1 and Ondansetron and Dexamethasone ? ?Airway Management Planned: Nasal ETT ? ?Additional Equipment: None ? ?Intra-op Plan:  ? ?Post-operative Plan: Extubation in OR ? ?Informed Consent: I have reviewed the patients History and Physical, chart, labs and discussed the procedure including the risks, benefits and alternatives for the proposed anesthesia with the patient or authorized representative who has indicated his/her understanding and acceptance.  ? ? ? ?Dental advisory given ? ?Plan Discussed with: CRNA and Surgeon ? ?Anesthesia Plan Comments: (See PAT note written 12/03/2021 by Myra Gianotti, PA-C. ?)  ? ? ? ? ?Anesthesia Quick Evaluation ? ?

## 2021-12-03 NOTE — Progress Notes (Signed)
Spoke with pt for pre-op call. Pt denies HTN, states he's never been told he had it and has never been treated for it. Pt has had a stroke in the past with no lasting weakness or paralysis. Takes Plavix and his last dose was 11/29/21. He is on Eliquis also due to PVD. He states his last dose of Eliquis was 12/01/21. Denies having diabetes.  ? ?Shower instructions given to pt.  ?

## 2021-12-03 NOTE — Progress Notes (Signed)
Anesthesia Chart Review: SAME DAY WORK-UP ? ? Case: [id number] Date/Time: 12/04/21 0715  ? Procedure: DENTAL RESTORATION/EXTRACTIONS  ? Anesthesia type: General  ? Pre-op diagnosis: DENTAL CARIES  ? Location: [facility] OR ROOM 06 / [place] [facility]  ? Surgeons: Diona Browner, DMD  ? ?  ? ? ?DISCUSSION: Patient is a 61 year old male scheduled for the above procedure.  Per H&P extraction of all remaining teeth is planned. Admission 10/2021 for MSSA bacteremia in setting of recent cocaine and periodontal disease. ? 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 ? ?[facility] admission 10/11/21/-10/17/21 with 2 days worsening fatigue and generalized weakness. + Fever 100.9. WBC 19K. Lactic acid 2.9. Blood cultures grew MSSA with antibiotics per ID. HS Troponins 241->240, but flat and felt consistent with demand ischemia. UDS also + cocaine, THC, opiates. He had a TTE 10/12/21 and later a TEE on 10/16/21. LVEF normal, normal regional wall motion abnormalities, moderate AI, no vegetation.  IVDU thought to be a contributing factor in bacteremia. Also with periodontal disease and referred to dental medicine/oral surgeon for out-patient evaluation and management. ? ?He is on Plavix and Eliquis per vascular surgery for PVD and LE stents (last 08/2021). He reported last Plavix 11/29/21 and last Eliquis 12/01/21 in preparation for surgery.  ? ?He is a same day work-up. Anesthesia team to evaluate on the day of surgery. By medication list currently available, he is on methadone per [facility]. ? ? ?VS:  ?BP Readings from Last 3  Encounters:  ?10/29/21 122/64  ?10/17/21 119/64  ?08/27/21 (!) 140/53  ? ?Pulse Readings from Last 3 Encounters:  ?10/29/21 67  ?10/17/21 (!) 54  ?08/27/21 74  ?  ? ?PROVIDERS: ?Camillia Herter, NP is PCP  ?[name], MD is vascular surgeon ?He is not followed by cardiology. Lyman Bishop, MD was consulted during 10/2021 admission for TEE only.  ? ? ?LABS: He is for labs on arrival as indicated. Last results include: ?Lab Results  ?Component Value Date  ? WBC 10.4 10/29/2021  ? HGB 11.6 (L) 10/29/2021  ? HCT 34.3 (L) 10/29/2021  ? PLT 166 10/29/2021  ? GLUCOSE 123 (H) 10/16/2021  ? ALT 15 10/11/2021  ? AST 26 10/11/2021  ? NA 133 (L) 10/16/2021  ? K 3.9 10/16/2021  ? CL 99 10/16/2021  ? CREATININE 0.78 10/16/2021  ? BUN 11 10/16/2021  ? CO2 25 10/16/2021  ? TSH 1.240 06/06/2021  ? HGBA1C 5.0 06/06/2021  ? ? ? ?IMAGES: ?Orthopantogram/panoramic 10/11/21: ?IMPRESSION: ?Limited examination due to patient motion demonstrating diffusely ?poor dentition with multiple tooth extractions, dental caries, and ?periapical lucencies suggesting periodontal disease. ? ?CXR 10/11/21: ?FINDINGS: ?- Heart size and mediastinal contours are within normal limits. ?Hyperinflated lungs. No suspicious pulmonary opacities identified. ?- No pleural effusion or pneumothorax visualized. ?- No acute osseous abnormality appreciated. ?IMPRESSION: ?Emphysematous changes with no acute process identified. ? ? ?EKG: 10/11/21:  ?Sinus rhythm with occasional Premature ventricular complexes ?Possible Left atrial enlargement ?Nonspecific T wave abnormality ?Abnormal ECG ?When compared with ECG of 08-Mar-2020 07:31, ?PREVIOUS ECG IS PRESENT ?No significant change since last tracing ?Confirmed by Deno Etienne [phone]) on 10/11/2021 2:58:21 PM ? ? ?  CV: ?TEE 10/16/21: ?IMPRESSIONS  ? 1. Left ventricular ejection fraction, by estimation, is 55 to 60%. The  ?left ventricle has normal function. The left ventricle has no regional  ?wall motion abnormalities. There is  mild left ventricular hypertrophy.  ? 2. Right ventricular systolic function is normal. The right ventricular  ?size is normal.  ? 3. Left atrial size was mildly dilated. No left atrial/left atrial  ?appendage thrombus was detected. The LAA emptying velocity was 62 cm/s.  ? 4. The mitral valve is degenerative. Mild mitral valve regurgitation.  ? 5. No vegetation. The aortic valve is tricuspid. Aortic valve  ?regurgitation moderate central. No aortic stenosis is present.  ?- Conclusion(s)/Recommendation(s): No evidence of vegetation/infective  ?endocarditis on this transesophageael echocardiogram.  ? ? ?TTE 10/12/21: ?IMPRESSIONS  ? 1. Left ventricular ejection fraction, by estimation, is 55 to 60%. The  ?left ventricle has normal function. The left ventricle has no regional  ?wall motion abnormalities. Left ventricular diastolic parameters were  ?normal.  ? 2. Right ventricular systolic function is normal. The right ventricular  ?size is normal. There is normal pulmonary artery systolic pressure. The  ?estimated right ventricular systolic pressure is 81.4 mmHg.  ? 3. Left atrial size was moderately dilated.  ? 4. The mitral valve is degenerative. Portions of the submitral apparatus  ?are hypermoble (not typical for vegetation based on position). Mild mitral  ?valve regurgitation.  ? 5. The aortic valve is abnormal. Trileaflet with restricted left coronary  ?cusp motion and associated moderate nodular calcification (cannot exclude  ?old vegetation). Aortic valve regurgitation is moderate. Aortic  ?regurgitation PHT measures 351 msec.  ?Aortic valve mean gradient measures 9.5 mmHg.  ? 6. The inferior vena cava is dilated in size with <50% respiratory  ?variability, suggesting right atrial pressure of 15 mmHg.  ? 7. In setting of MSSA bacteremia and abnormal aortic valve, would suggest  ?TEE if clinically feasible.  ?- Comparison(s): Prior images reviewed side by side. Previous study was very  ?limited.  ? ? ?Past  Medical History:  ?Diagnosis Date  ? Allergy 10/2011  ? Anal cancer ([place]) 10/14/2011  ? 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 Anxiety   ? Arthritis   ? DDD lumbar, arthritis knees  ? DJD (degenerative joint disease) of lumbar spine   ? GERD (gastroesophageal reflux disease)   ? Hemorrhoid   ? internal  ? History of bowel resection 04/19/2017  ? Hypertension   ? EKG 12/12 EPIC   no  PCP- states increased lately but hasnt been diagnosed formally  ? Illicit drug use   ? + UDS cocaine, THC, opiates 10/11/21  ? Inguinal hernia   ? MSSA bacteremia 10/11/2021  ? s/p IV cefazolin, po Linezolid; 10/16/21 TEE w/o vegetations.  ? Peripheral vascular disease ([place])   ? Pneumonia   ? as child, cough at present with no fever  ? Pneumonia   ? as child, cough at present with no fever   ? Radiation 11/19/11-01/08/12  ? 5040 cGy 28 fx Pelvis and inguinal area  ? ? ?Past Surgical History:  ?Procedure Laterality Date  ? APPENDECTOMY    ? age 31 or 67  ? EXAMINATION UNDER ANESTHESIA  10/14/2011  ? Procedure: EXAM UNDER ANESTHESIA;  Surgeon: Earnstine Regal, MD;  Location: [facility];  Service: General;  Laterality: N/A;  exam under anethesia, Excision of mass anal canal, 1.5cm  ? INGUINAL HERNIA REPAIR  05/11/2012  ? Procedure: HERNIA REPAIR INGUINAL  ADULT;  Surgeon: Earnstine Regal, MD;  Location: [facility];  Service: General;  Laterality: Left;  left inguinal hernia repair with mesh  ? LOWER EXTREMITY ANGIOGRAPHY Left 07/02/2021  ? Procedure: LOWER EXTREMITY ANGIOGRAPHY;  Surgeon: Katha Cabal, MD;  Location: [facility];  Service: Cardiovascular;  Laterality: Left;  ? LOWER EXTREMITY ANGIOGRAPHY Right 07/16/2021  ? Procedure: LOWER EXTREMITY ANGIOGRAPHY;  Surgeon: Katha Cabal, MD;  Location: [facility];  Service: Cardiovascular;  Laterality: Right;  ? LOWER EXTREMITY ANGIOGRAPHY Left 08/27/2021  ? Procedure: LOWER EXTREMITY ANGIOGRAPHY;  Surgeon: Katha Cabal, MD;  Location: [facility];  Service: Cardiovascular;  Laterality: Left;  ? SMALL INTESTINE SURGERY  04/19/2017  ? exploratory lap, partial small bowel resection for perforated ulcer/abcess, radiation enteritis  ? surgical patho

## 2021-12-04 ENCOUNTER — Ambulatory Visit (HOSPITAL_COMMUNITY): Payer: Medicare Other | Admitting: Vascular Surgery

## 2021-12-04 ENCOUNTER — Encounter (HOSPITAL_COMMUNITY): Payer: Self-pay | Admitting: Oral Surgery

## 2021-12-04 ENCOUNTER — Ambulatory Visit (HOSPITAL_COMMUNITY)
Admission: RE | Admit: 2021-12-04 | Discharge: 2021-12-04 | Disposition: A | Discharge status: Home / Self Care | Payer: Medicare Other | Attending: Oral Surgery | Admitting: Oral Surgery

## 2021-12-04 ENCOUNTER — Encounter (HOSPITAL_COMMUNITY)
Admission: RE | Disposition: A | Discharge status: Home / Self Care | Payer: Self-pay | Source: Home / Self Care | Attending: Oral Surgery

## 2021-12-04 ENCOUNTER — Ambulatory Visit (HOSPITAL_BASED_OUTPATIENT_CLINIC_OR_DEPARTMENT_OTHER): Payer: Medicare Other | Admitting: Vascular Surgery

## 2021-12-04 ENCOUNTER — Other Ambulatory Visit: Payer: Self-pay

## 2021-12-04 DIAGNOSIS — Z8673 Personal history of transient ischemic attack (TIA), and cerebral infarction without residual deficits: Secondary | ICD-10-CM | POA: Diagnosis not present

## 2021-12-04 DIAGNOSIS — F32A Depression, unspecified: Secondary | ICD-10-CM | POA: Diagnosis not present

## 2021-12-04 DIAGNOSIS — M270 Developmental disorders of jaws: Secondary | ICD-10-CM | POA: Insufficient documentation

## 2021-12-04 DIAGNOSIS — F419 Anxiety disorder, unspecified: Secondary | ICD-10-CM | POA: Insufficient documentation

## 2021-12-04 DIAGNOSIS — I739 Peripheral vascular disease, unspecified: Secondary | ICD-10-CM | POA: Insufficient documentation

## 2021-12-04 DIAGNOSIS — M2601 Maxillary hyperplasia: Secondary | ICD-10-CM | POA: Diagnosis not present

## 2021-12-04 DIAGNOSIS — I10 Essential (primary) hypertension: Secondary | ICD-10-CM | POA: Insufficient documentation

## 2021-12-04 DIAGNOSIS — F17200 Nicotine dependence, unspecified, uncomplicated: Secondary | ICD-10-CM | POA: Diagnosis not present

## 2021-12-04 DIAGNOSIS — K029 Dental caries, unspecified: Secondary | ICD-10-CM | POA: Diagnosis present

## 2021-12-04 DIAGNOSIS — K0889 Other specified disorders of teeth and supporting structures: Secondary | ICD-10-CM | POA: Insufficient documentation

## 2021-12-04 DIAGNOSIS — M2607 Excessive tuberosity of jaw: Secondary | ICD-10-CM

## 2021-12-04 DIAGNOSIS — K219 Gastro-esophageal reflux disease without esophagitis: Secondary | ICD-10-CM | POA: Diagnosis not present

## 2021-12-04 DIAGNOSIS — J449 Chronic obstructive pulmonary disease, unspecified: Secondary | ICD-10-CM | POA: Insufficient documentation

## 2021-12-04 HISTORY — DX: Other psychoactive substance abuse, uncomplicated: F19.10

## 2021-12-04 HISTORY — DX: Chronic obstructive pulmonary disease, unspecified: J44.9

## 2021-12-04 HISTORY — DX: Cerebral infarction, unspecified: I63.9

## 2021-12-04 HISTORY — DX: Other psychoactive substance use, unspecified, uncomplicated: F19.90

## 2021-12-04 HISTORY — PX: TOOTH EXTRACTION: SHX859

## 2021-12-04 LAB — CBC
HCT: 34.7 % — ABNORMAL LOW (ref 39.0–52.0)
Hemoglobin: 11.7 g/dL — ABNORMAL LOW (ref 13.0–17.0)
MCH: 30.2 pg (ref 26.0–34.0)
MCHC: 33.7 g/dL (ref 30.0–36.0)
MCV: 89.4 fL (ref 80.0–100.0)
Platelets: 150 10*3/uL (ref 150–400)
RBC: 3.88 MIL/uL — ABNORMAL LOW (ref 4.22–5.81)
RDW: 15.4 % (ref 11.5–15.5)
WBC: 8.5 10*3/uL (ref 4.0–10.5)
nRBC: 0 % (ref 0.0–0.2)

## 2021-12-04 LAB — COMPREHENSIVE METABOLIC PANEL
ALT: 20 U/L (ref 0–44)
AST: 32 U/L (ref 15–41)
Albumin: 3.5 g/dL (ref 3.5–5.0)
Alkaline Phosphatase: 94 U/L (ref 38–126)
Anion gap: 8 (ref 5–15)
BUN: 10 mg/dL (ref 8–23)
CO2: 22 mmol/L (ref 22–32)
Calcium: 9.1 mg/dL (ref 8.9–10.3)
Chloride: 105 mmol/L (ref 98–111)
Creatinine, Ser: 0.8 mg/dL (ref 0.61–1.24)
GFR, Estimated: >60 mL/min (ref >60)
Glucose, Bld: 81 mg/dL (ref 70–99)
Potassium: 4.1 mmol/L (ref 3.5–5.1)
Sodium: 135 mmol/L (ref 135–145)
Total Bilirubin: 0.8 mg/dL (ref 0.3–1.2)
Total Protein: 6.8 g/dL (ref 6.5–8.1)

## 2021-12-04 SURGERY — DENTAL RESTORATION/EXTRACTIONS
Anesthesia: General | Site: Mouth

## 2021-12-04 MED ORDER — ONDANSETRON HCL 4 MG/2ML IJ SOLN
INTRAMUSCULAR | Status: DC | PRN
  Administered 2021-12-04: 4 mg via INTRAVENOUS

## 2021-12-04 MED ORDER — LACTATED RINGERS IV SOLN: INTRAVENOUS | Status: DC

## 2021-12-04 MED ORDER — SUGAMMADEX SODIUM 200 MG/2ML IV SOLN
INTRAVENOUS | Status: DC | PRN
Start: 2021-12-04 — End: 2021-12-04
  Administered 2021-12-04: 131.6 mg via INTRAVENOUS

## 2021-12-04 MED ORDER — MIDAZOLAM HCL 2 MG/2ML IJ SOLN
INTRAMUSCULAR | Status: DC | PRN
Start: 2021-12-04 — End: 2021-12-04
  Administered 2021-12-04: 4 mg via INTRAVENOUS

## 2021-12-04 MED ORDER — CEFAZOLIN SODIUM-DEXTROSE 2-4 GM/100ML-% IV SOLN
2.0000 g | INTRAVENOUS | Status: AC
  Administered 2021-12-04: 2 g via INTRAVENOUS
  Filled 2021-12-04: qty 100

## 2021-12-04 MED ORDER — MIDAZOLAM HCL 2 MG/2ML IJ SOLN
INTRAMUSCULAR | Status: AC
  Filled 2021-12-04: qty 2

## 2021-12-04 MED ORDER — AMOXICILLIN 500 MG PO CAPS: 500.0000 mg | ORAL_CAPSULE | Freq: Three times a day (TID) | ORAL | 0 refills | Status: DC

## 2021-12-04 MED ORDER — OXYCODONE HCL 5 MG PO TABS
ORAL_TABLET | ORAL | Status: AC
  Filled 2021-12-04: qty 1

## 2021-12-04 MED ORDER — PROPOFOL 10 MG/ML IV BOLUS
INTRAVENOUS | Status: DC | PRN
  Administered 2021-12-04: 130 mg via INTRAVENOUS

## 2021-12-04 MED ORDER — ACETAMINOPHEN 500 MG PO TABS
1000.0000 mg | ORAL_TABLET | Freq: Once | ORAL | Status: AC
  Administered 2021-12-04: 1000 mg via ORAL
  Filled 2021-12-04: qty 2

## 2021-12-04 MED ORDER — KETOROLAC TROMETHAMINE 15 MG/ML IJ SOLN
INTRAMUSCULAR | Status: DC | PRN
  Administered 2021-12-04: 15 mg via INTRAVENOUS

## 2021-12-04 MED ORDER — OXYCODONE-ACETAMINOPHEN 5-325 MG PO TABS
1.0000 | ORAL_TABLET | ORAL | 0 refills | Status: AC | PRN
Start: 2021-12-04 — End: 2021-12-09

## 2021-12-04 MED ORDER — OXYMETAZOLINE HCL 0.05 % NA SOLN
NASAL | Status: DC | PRN
  Administered 2021-12-04: 1 via TOPICAL

## 2021-12-04 MED ORDER — FENTANYL CITRATE (PF) 250 MCG/5ML IJ SOLN
INTRAMUSCULAR | Status: AC
  Filled 2021-12-04: qty 5

## 2021-12-04 MED ORDER — SODIUM CHLORIDE 0.9 % IR SOLN
Status: DC | PRN
  Administered 2021-12-04: 1000 mL

## 2021-12-04 MED ORDER — FENTANYL CITRATE (PF) 250 MCG/5ML IJ SOLN
INTRAMUSCULAR | Status: DC | PRN
  Administered 2021-12-04: 100 ug via INTRAVENOUS

## 2021-12-04 MED ORDER — ORAL CARE MOUTH RINSE: 15.0000 mL | Freq: Once | OROMUCOSAL | Status: AC

## 2021-12-04 MED ORDER — OXYCODONE HCL 5 MG PO TABS
5.0000 mg | ORAL_TABLET | Freq: Once | ORAL | Status: AC | PRN
  Administered 2021-12-04: 5 mg via ORAL

## 2021-12-04 MED ORDER — LIDOCAINE-EPINEPHRINE 2 %-1:100000 IJ SOLN
INTRAMUSCULAR | Status: DC | PRN
  Administered 2021-12-04: 20 mL

## 2021-12-04 MED ORDER — EPHEDRINE SULFATE-NACL 50-0.9 MG/10ML-% IV SOSY
PREFILLED_SYRINGE | INTRAVENOUS | Status: DC | PRN
  Administered 2021-12-04: 15 mg via INTRAVENOUS

## 2021-12-04 MED ORDER — MEPERIDINE HCL 25 MG/ML IJ SOLN: 6.2500 mg | INTRAMUSCULAR | Status: DC | PRN

## 2021-12-04 MED ORDER — 0.9 % SODIUM CHLORIDE (POUR BTL) OPTIME
TOPICAL | Status: DC | PRN
  Administered 2021-12-04: 1000 mL

## 2021-12-04 MED ORDER — OXYCODONE HCL 5 MG/5ML PO SOLN: 5.0000 mg | Freq: Once | ORAL | Status: AC | PRN

## 2021-12-04 MED ORDER — ROCURONIUM BROMIDE 10 MG/ML (PF) SYRINGE
PREFILLED_SYRINGE | INTRAVENOUS | Status: DC | PRN
  Administered 2021-12-04: 50 mg via INTRAVENOUS

## 2021-12-04 MED ORDER — DEXAMETHASONE SODIUM PHOSPHATE 10 MG/ML IJ SOLN
INTRAMUSCULAR | Status: DC | PRN
  Administered 2021-12-04: 5 mg via INTRAVENOUS

## 2021-12-04 MED ORDER — CHLORHEXIDINE GLUCONATE 0.12 % MT SOLN
15.0000 mL | Freq: Once | OROMUCOSAL | Status: AC
  Administered 2021-12-04: 15 mL via OROMUCOSAL
  Filled 2021-12-04: qty 15

## 2021-12-04 MED ORDER — GLYCOPYRROLATE 0.2 MG/ML IJ SOLN
INTRAMUSCULAR | Status: DC | PRN
Start: 2021-12-04 — End: 2021-12-04
  Administered 2021-12-04 (×2): .1 mg via INTRAVENOUS

## 2021-12-04 MED ORDER — MIDAZOLAM HCL 2 MG/2ML IJ SOLN: 0.5000 mg | Freq: Once | INTRAMUSCULAR | Status: DC | PRN

## 2021-12-04 MED ORDER — EPHEDRINE SULFATE (PRESSORS) 50 MG/ML IJ SOLN
INTRAMUSCULAR | Status: DC | PRN
Start: 2021-12-04 — End: 2021-12-04
  Administered 2021-12-04 (×2): 5 mg via INTRAVENOUS

## 2021-12-04 MED ORDER — LIDOCAINE 2% (20 MG/ML) 5 ML SYRINGE
INTRAMUSCULAR | Status: DC | PRN
Start: 2021-12-04 — End: 2021-12-04
  Administered 2021-12-04: 40 mg via INTRAVENOUS

## 2021-12-04 MED ORDER — FENTANYL CITRATE (PF) 100 MCG/2ML IJ SOLN: 25.0000 ug | INTRAMUSCULAR | Status: DC | PRN

## 2021-12-04 SURGICAL SUPPLY — 37 items
BAG COUNTER SPONGE SURGICOUNT (BAG) IMPLANT
BLADE SURG 15 STRL LF DISP TIS (BLADE) ×1 IMPLANT
BLADE SURG 15 STRL SS (BLADE) ×2
BUR CROSS CUT FISSURE 1.6 (BURR) ×2 IMPLANT
BUR EGG ELITE 4.0 (BURR) ×2 IMPLANT
CANISTER SUCT 3000ML PPV (MISCELLANEOUS) ×2 IMPLANT
COVER SURGICAL LIGHT HANDLE (MISCELLANEOUS) ×2 IMPLANT
DECANTER SPIKE VIAL GLASS SM (MISCELLANEOUS) ×1 IMPLANT
DRAPE U-SHAPE 76X120 STRL (DRAPES) ×2 IMPLANT
GAUZE 4X4 16PLY ~~LOC~~+RFID DBL (SPONGE) ×1 IMPLANT
GAUZE PACKING FOLDED 2  STR (GAUZE/BANDAGES/DRESSINGS) ×2
GAUZE PACKING FOLDED 2 STR (GAUZE/BANDAGES/DRESSINGS) ×1 IMPLANT
GLOVE SURG ENC MOIS LTX SZ6.5 (GLOVE) IMPLANT
GLOVE SURG ENC MOIS LTX SZ7 (GLOVE) IMPLANT
GLOVE SURG ENC MOIS LTX SZ8 (GLOVE) ×2 IMPLANT
GLOVE SURG UNDER POLY LF SZ6.5 (GLOVE) IMPLANT
GLOVE SURG UNDER POLY LF SZ7 (GLOVE) IMPLANT
GOWN STRL REUS W/ TWL LRG LVL3 (GOWN DISPOSABLE) ×1 IMPLANT
GOWN STRL REUS W/ TWL XL LVL3 (GOWN DISPOSABLE) ×1 IMPLANT
GOWN STRL REUS W/TWL LRG LVL3 (GOWN DISPOSABLE) ×2
GOWN STRL REUS W/TWL XL LVL3 (GOWN DISPOSABLE) ×2
IV NS 1000ML (IV SOLUTION) ×2
IV NS 1000ML BAXH (IV SOLUTION) ×1 IMPLANT
KIT BASIN OR (CUSTOM PROCEDURE TRAY) ×2 IMPLANT
KIT TURNOVER KIT B (KITS) ×2 IMPLANT
NDL HYPO 25GX1X1/2 BEV (NEEDLE) ×2 IMPLANT
NEEDLE HYPO 25GX1X1/2 BEV (NEEDLE) ×2 IMPLANT
NS IRRIG 1000ML POUR BTL (IV SOLUTION) ×2 IMPLANT
PAD ARMBOARD 7.5X6 YLW CONV (MISCELLANEOUS) ×2 IMPLANT
SLEEVE IRRIGATION ELITE 7 (MISCELLANEOUS) ×2 IMPLANT
SPONGE SURGIFOAM ABS GEL 12-7 (HEMOSTASIS) IMPLANT
SUT CHROMIC 3 0 PS 2 (SUTURE) ×2 IMPLANT
SYR BULB IRRIG 60ML STRL (SYRINGE) ×2 IMPLANT
SYR CONTROL 10ML LL (SYRINGE) ×2 IMPLANT
TRAY ENT MC OR (CUSTOM PROCEDURE TRAY) ×2 IMPLANT
TUBING IRRIGATION (MISCELLANEOUS) ×2 IMPLANT
YANKAUER SUCT BULB TIP NO VENT (SUCTIONS) ×2 IMPLANT

## 2021-12-04 NOTE — Anesthesia Procedure Notes (Signed)
Procedure Name: Intubation ?Date/Time: 12/04/2021 9:05 AM ?Performed by: Minerva Ends, CRNA ?Pre-anesthesia Checklist: Patient identified, Emergency Drugs available, Suction available and Patient being monitored ?Patient Re-evaluated:Patient Re-evaluated prior to induction ?Oxygen Delivery Method: Circle system utilized ?Preoxygenation: Pre-oxygenation with 100% oxygen ?Induction Type: IV induction ?Ventilation: Mask ventilation without difficulty ?Laryngoscope Size: Mac and 3 ?Grade View: Grade II ?Nasal Tubes: Left and Nasal Dwyane Luo ?Tube size: 7.0 mm ?Number of attempts: 1 ?Airway Equipment and Method: Stylet and Oral airway ?Placement Confirmation: ETT inserted through vocal cords under direct vision, positive ETCO2 and breath sounds checked- equal and bilateral ?Tube secured with: Tape ?Dental Injury: Teeth and Oropharynx as per pre-operative assessment  ? ? ? ? ?

## 2021-12-04 NOTE — Progress Notes (Signed)
Patient had coffee with creamer - surgery delayed until 0900 per Dr. Glennon Mac.  Dr. Hoyt Koch aware, OR aware, CRNA aware.   ?

## 2021-12-04 NOTE — Op Note (Signed)
12/04/2021 ? ?9:54 AM ? ?PATIENT:  Martin [name]  61 y.o. male ? ?PRE-OPERATIVE DIAGNOSIS:  NON-RESTORABLE TEETH # 1, 3, 5, 11, 12, 14, 15, 17, 19, 21, 22, 23, 24, 25, 26, 27, 28, 29, 31, SECONDARY TO DENTAL CARIES, LEFT MANDIBULAR LINGUAL TORUS. ? ?POST-OPERATIVE DIAGNOSIS:  SAME PLUS HYPERPLASTIC RIGHT MAXILLARY TUBEROSITY ? ?PROCEDURE:  Procedure(s): EXTRACTION TEETH # 1, 3, 5, 11, 12, 14, 15, 17, 19, 21, 22, 23, 24, 25, 26, 27, 28, 29, 31, ALVEOLOPLASTY, REMOVAL LEFT MANDIBULAR LINGUAL TORUS, REDUCTION RIGHT MAXILLARY TUBEROSITY ? ?SURGEON:  Surgeon(s): ?Diona Browner, DMD ? ?ANESTHESIA:   local and general ? ?EBL:  minimal ? ?DRAINS: none  ? ?SPECIMEN:  No Specimen ? ?COUNTS:  YES ? ?PLAN OF CARE: Discharge to home after PACU ? ?PATIENT DISPOSITION:  PACU - hemodynamically stable. ?  ?PROCEDURE DETAILS: ?Dictation # [id number] ? ?Gae Bon, DMD ?12/04/2021 ?9:54 AM ? ? ? ? ? ? ? ? ? ? ? ? ? ? ? ?  ?

## 2021-12-04 NOTE — Op Note (Signed)
NAME: AEDYN, KEMPFER ?MEDICAL RECORD NO: [id number] ?ACCOUNT NO: [id number] ?DATE OF BIRTH: [birth year] ?FACILITY: [facility] ?LOCATION: [facility] ?PHYSICIAN: Gae Bon, DDS ? ?Operative Report  ? ?DATE OF PROCEDURE: 12/04/2021 ? ?PREOPERATIVE DIAGNOSES:  Nonrestorable teeth numbers 1, 3, 5, 11, 12, 14, 15, 17, 19, 21, 22, 23, 24, 25, 26, 27, 28, 29, 31 secondary to dental caries and left mandibular lingual torus. ? ?POSTOPERATIVE DIAGNOSES:  Nonrestorable teeth numbers 1, 3, 5, 11, 12, 14, 15, 17, 19, 21, 22, 23, 24, 25, 26, 27, 28, 29, 31 secondary to dental caries and left mandibular lingual torus plus hyperplastic right maxillary tuberosity. ? ?PROCEDURE:  Extraction teeth numbers 1, 3, 5, 11, 12, 14, 15, 17, 19, 21, 22, 23, 24, 25, 26, 27, 28, 29, 31; alveoloplasty; removal left mandibular lingual torus; reduction right maxillary tuberosity. ? ?SURGEON:  Gae Bon, DDS ? ?ANESTHESIA:  General. Dr. Glennon Mac attending. ? ?DESCRIPTION OF PROCEDURE:  The patient was taken to the operating room and placed on the table in supine position.  General anesthesia was administered and nasal endotracheal tube was placed and secured.  The eyes were protected and the patient was  ?draped for surgery.  Timeout was performed.  The posterior pharynx was suctioned and a throat pack was placed.  2% lidocaine 1:100,000 epinephrine was infiltrated in an inferior alveolar block on the right and left sides and in buccal and palatal  ?infiltration around the maxillary teeth. Sweetheart retractor was used to retract the tongue.  A #15 blade was used to make an incision beginning at tooth #17, carried forward on the alveolar ridge to tooth numbers 19, 21, 22, 23, 24, 25 and 26.  The  ?periosteum was reflected.  The teeth were elevated and removed with the dental forceps.  The sockets were curetted.  The tissue was trimmed to allow for closure.  The tissue was reflected to expose the alveolus, which was irregular in contour and had   ?sharp areas and undercuts.  Alveoplasty was performed using the egg bur followed by the bone file, then the lingual tissue was reflected to expose the lingual torus.  This was reduced using the egg bur under irrigation and then the area of the left  ?mandible was irrigated and closed with 3-0 chromic.  The left maxilla was operated next.  A 15 blade was used to make an incision around teeth numbers 15, 14, 12 and 11.  The periosteum was reflected.  The teeth were elevated and removed from the mouth  ?with the dental forceps.  The sockets were curetted.  The tissue was trimmed and then reflected to expose the irregular shaped alveolar crest.  Alveoplasty was performed using the egg bur followed by the bone file.  Then, the sweetheart retractor and  ?bite block were repositioned to the other side of the mouth and the right side was operated.  The 15 blade was used to make an incision around teeth numbers 27, 28, 29 and 31 in the mandible and around teeth numbers 1, 3, and 6 in the maxilla.  The  ?periosteum was reflected from around these teeth.  The teeth were elevated.  Teeth numbers 31, 1, 3 and 6 were removed with the dental forceps.  Teeth numbers 27, 28 and 29 required removal of interproximal bone in order to gain access to elevate the  ?teeth.  The teeth were then elevated and removed with the dental forceps.  Then, the periosteum was reflected to expose the alveolar crest.  Alveoplasty was performed using the egg-shaped bur followed by the bone file.  Then, the right maxilla was  ?examined and found to have excessive bone at the tuberosity area behind tooth #1 and the rongeur was used to smooth the level of bone, so that a denture could be accommodated after healing and then the right maxilla and mandible were irrigated and closed ? with 3-0 chromic.  Additional local anesthesia was administered.  The oral cavity was irrigated and suctioned.  The throat pack was removed.  The patient was left under care of  anesthesia for extubation and transport to recovery room with plans for  ?discharge home through day surgery. ? ?ESTIMATED BLOOD LOSS:  Minimum. ? ?COMPLICATIONS:  None. ? ?SPECIMENS:  No specimens. ? ? ?SHW ?D: 12/04/2021 10:00:00 am T: 12/04/2021 11:02:00 am  ?JOB: [id number]/ [id number]  ?

## 2021-12-04 NOTE — H&P (Signed)
H&P documentation  -History and Physical Reviewed  -Patient has been re-examined  -No change in the plan of care  Martin [name]  

## 2021-12-04 NOTE — Anesthesia Postprocedure Evaluation (Signed)
Anesthesia Post Note ? ?Patient: LAMON ROTUNDO ? ?Procedure(s) Performed: DENTAL RESTORATION/EXTRACTIONS (Mouth) ? ?  ? ?Patient location during evaluation: PACU ?Anesthesia Type: General ?Level of consciousness: awake and alert, oriented and patient cooperative ?Pain management: pain level controlled ?Vital Signs Assessment: post-procedure vital signs reviewed and stable ?Respiratory status: spontaneous breathing, nonlabored ventilation and respiratory function stable ?Cardiovascular status: blood pressure returned to baseline and stable ?Postop Assessment: no apparent nausea or vomiting ?Anesthetic complications: no ? ? ?No notable events documented. ? ?Last Vitals:  ?Vitals:  ? 12/04/21 1018 12/04/21 1032  ?BP: (!) 139/46 (!) 138/41  ?Pulse: 66 63  ?Resp: 12 11  ?Temp:  (!) 36.3 ?C  ?SpO2: 95% 98%  ?  ?Last Pain:  ?Vitals:  ? 12/04/21 1032  ?TempSrc:   ?PainSc: 0-No pain  ? ? ?  ?  ?  ?  ?  ?  ? ?Walker Paddack,E. Janeth Terry ? ? ? ? ?

## 2021-12-04 NOTE — Transfer of Care (Signed)
Immediate Anesthesia Transfer of Care Note ? ?Patient: Martin [name] ? ?Procedure(s) Performed: DENTAL RESTORATION/EXTRACTIONS (Mouth) ? ?Patient Location: PACU ? ?Anesthesia Type:General ? ?Level of Consciousness: sedated ? ?Airway & Oxygen Therapy: Patient Spontanous Breathing ? ?Post-op Assessment: Report given to RN and Post -op Vital signs reviewed and stable ? ?Post vital signs: Reviewed and stable ? ?Last Vitals:  ?Vitals Value Taken Time  ?BP 121/42 12/04/21 1002  ?Temp    ?Pulse 65 12/04/21 1004  ?Resp 12 12/04/21 1004  ?SpO2 91 % 12/04/21 1004  ?Vitals shown include unvalidated device data. ? ?Last Pain:  ?Vitals:  ? 12/04/21 0654  ?TempSrc:   ?PainSc: 0-No pain  ?   ? ?  ? ?Complications: No notable events documented. ?

## 2021-12-05 ENCOUNTER — Encounter (HOSPITAL_COMMUNITY): Payer: Self-pay | Admitting: Oral Surgery

## 2021-12-09 DIAGNOSIS — F1120 Opioid dependence, uncomplicated: Secondary | ICD-10-CM | POA: Diagnosis not present

## 2021-12-16 DIAGNOSIS — F1120 Opioid dependence, uncomplicated: Secondary | ICD-10-CM | POA: Diagnosis not present

## 2021-12-23 DIAGNOSIS — F1120 Opioid dependence, uncomplicated: Secondary | ICD-10-CM | POA: Diagnosis not present

## 2021-12-27 ENCOUNTER — Other Ambulatory Visit: Payer: Self-pay | Admitting: Family

## 2021-12-27 ENCOUNTER — Other Ambulatory Visit (INDEPENDENT_AMBULATORY_CARE_PROVIDER_SITE_OTHER): Payer: Self-pay | Admitting: Nurse Practitioner

## 2021-12-27 NOTE — Telephone Encounter (Signed)
Requested medications are due for refill today.  yes ? ?Requested medications are on the active medications list.  yes ? ?Last refill. 11/18/2021 #60 0 refills ? ?Future visit scheduled.   no ? ?Notes to clinic.  Rx signed by Eulogio Ditch. ? ? ? ?Requested Prescriptions  ?Pending Prescriptions Disp Refills  ? ELIQUIS 2.5 MG TABS tablet [Pharmacy Med Name: Eliquis 2.5 MG Oral Tablet] 60 tablet 0  ?  Sig: Take 1 tablet by mouth twice daily  ?  ? There is no refill protocol information for this order  ?  ?  ?

## 2021-12-27 NOTE — Telephone Encounter (Signed)
Pt called and stated he needed the Eliquis refill today/ he asked if someone can call him / he cant go without this medication / please advise  ?

## 2021-12-27 NOTE — Telephone Encounter (Signed)
Medication Refill - Medication:  ?ELIQUIS 2.5 MG TABS tablet  ? ?Has the patient contacted their pharmacy? Yes.   ?Contact PCP ? ?Preferred Pharmacy (with phone number or street name):  ?[facility] [facility] ([place] - [address]  ?[address], [place] [address]  ?Phone:  [phone]  Fax:  [phone]  ? ?Has the patient been seen for an appointment in the last year OR does the patient have an upcoming appointment? Yes. ? ?Agent: Please be advised that RX refills may take up to 3 business days. We ask that you follow-up with your pharmacy. ?

## 2021-12-27 NOTE — Telephone Encounter (Signed)
Eliquis last refilled on 11/18/2021 by Eulogio Ditch, NP at [facility]. Request refills from the same.

## 2021-12-27 NOTE — Telephone Encounter (Unsigned)
This rx has also been sent to [facility] but I am unsure if they can fill today. The pt is very upset and wants the medication today. He is afraid to miss a dose. Please call in a few days worth of Eloquis until [facility] gets to it. ?

## 2021-12-30 ENCOUNTER — Other Ambulatory Visit (INDEPENDENT_AMBULATORY_CARE_PROVIDER_SITE_OTHER): Payer: Self-pay | Admitting: Nurse Practitioner

## 2021-12-30 DIAGNOSIS — F1120 Opioid dependence, uncomplicated: Secondary | ICD-10-CM | POA: Diagnosis not present

## 2021-12-30 NOTE — Telephone Encounter (Signed)
Bring patient in for abi and left arterial study see [facility] or [facility] per Dr Delana Meyer ?

## 2021-12-30 NOTE — Telephone Encounter (Signed)
Patient need refill on eliquis to be sent to  ?[facility].  He also stated his left leg is still hurting and needs an appointment ?

## 2022-01-06 DIAGNOSIS — F1120 Opioid dependence, uncomplicated: Secondary | ICD-10-CM | POA: Diagnosis not present

## 2022-01-07 ENCOUNTER — Other Ambulatory Visit (INDEPENDENT_AMBULATORY_CARE_PROVIDER_SITE_OTHER): Payer: Self-pay | Admitting: Vascular Surgery

## 2022-01-07 DIAGNOSIS — Z9889 Other specified postprocedural states: Secondary | ICD-10-CM

## 2022-01-08 ENCOUNTER — Encounter (INDEPENDENT_AMBULATORY_CARE_PROVIDER_SITE_OTHER): Payer: Self-pay | Admitting: Nurse Practitioner

## 2022-01-08 ENCOUNTER — Ambulatory Visit (INDEPENDENT_AMBULATORY_CARE_PROVIDER_SITE_OTHER): Payer: Medicare HMO

## 2022-01-08 ENCOUNTER — Ambulatory Visit (INDEPENDENT_AMBULATORY_CARE_PROVIDER_SITE_OTHER): Payer: Medicare HMO | Admitting: Nurse Practitioner

## 2022-01-08 VITALS — BP 179/67 | HR 69 | Resp 18 | Ht 67.0 in | Wt 147.0 lb

## 2022-01-08 DIAGNOSIS — E785 Hyperlipidemia, unspecified: Secondary | ICD-10-CM

## 2022-01-08 DIAGNOSIS — I739 Peripheral vascular disease, unspecified: Secondary | ICD-10-CM

## 2022-01-08 DIAGNOSIS — Z9889 Other specified postprocedural states: Secondary | ICD-10-CM | POA: Diagnosis not present

## 2022-01-08 DIAGNOSIS — Z720 Tobacco use: Secondary | ICD-10-CM

## 2022-01-08 DIAGNOSIS — I70222 Atherosclerosis of native arteries of extremities with rest pain, left leg: Secondary | ICD-10-CM | POA: Diagnosis not present

## 2022-01-08 MED ORDER — APIXABAN 2.5 MG PO TABS: 2.5000 mg | ORAL_TABLET | Freq: Two times a day (BID) | ORAL | 6 refills | Status: DC

## 2022-01-08 NOTE — H&P (View-Only) (Signed)
? ?Subjective:  ? ? Patient ID: Martin [name], male    DOB: 07-09-1961, 61 y.o.   MRN: [id number] ?No chief complaint on file. ? ? ?Martin [name] is a 61 year old male that returns to the office for followup and review of the noninvasive studies.  The patient most recently had intervention on 08/27/2021 of the left lower extremity. ? ?The patient notes that there has been a significant deterioration in the lower extremity symptoms.  The patient notes interval shortening of their claudication distance and development of mild rest pain symptoms. No new ulcers or wounds have occurred since the last visit. ? ?There have been no significant changes to the patient's overall health care. ? ?The patient denies amaurosis fugax or recent TIA symptoms. There are no recent neurological changes noted. ?There is no history of DVT, PE or superficial thrombophlebitis. ?The patient denies recent episodes of angina or shortness of breath.  ? ?ABI's Rt=1.04 and Lt=1.05 (previous ABI's Rt=1.12 and Lt=1.10) ?However with exercise the patient's ABIs are Rt=1.04 and Lt=.04  ? ? ?Duplex US of the lower extremity arterial system shows a 75 to 99% stenosis of the left SFA  ? ? ?Review of Systems  ?Skin:  Negative for wound.  ?All other systems reviewed and are negative. ? ?   ?Objective:  ? Physical Exam ?Vitals reviewed.  ?HENT:  ?   Head: Normocephalic.  ?Cardiovascular:  ?   Rate and Rhythm: Normal rate.  ?   Pulses:     ?     Dorsalis pedis pulses are 1+ on the right side and detected w/ Doppler on the left side.  ?     Posterior tibial pulses are 1+ on the right side and detected w/ Doppler on the left side.  ?Pulmonary:  ?   Effort: Pulmonary effort is normal.  ?Skin: ?   General: Skin is dry.  ?Neurological:  ?   Mental Status: He is alert and oriented to person, place, and time.  ?Psychiatric:     ?   Mood and Affect: Mood normal.     ?   Behavior: Behavior normal.     ?   Thought Content: Thought content normal.     ?   Judgment:  Judgment normal.  ? ? ?BP (!) 179/67 (BP Location: Right Arm)   Pulse 69   Resp 18   Ht 5' 7"  (1.702 m)   Wt 147 lb (66.7 kg)   BMI 23.02 kg/m?  ? ?Past Medical History:  ?Diagnosis Date  ? Allergy 10/2011  ? Anal cancer ([place]) 10/14/2011  ? 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 Anxiety   ? Arthritis   ? DDD lumbar, arthritis knees  ? COPD (chronic obstructive pulmonary disease) ([place])   ? DJD (degenerative joint disease) of lumbar spine   ? GERD (gastroesophageal reflux disease)   ? Hemorrhoid   ? internal  ? History of bowel resection 04/19/2017  ? Hypertension   ? Pt states he's never been told or treated for HTN  ? Illicit drug use   ? + UDS cocaine, THC, opiates 10/11/21  ? Inguinal hernia   ? MSSA bacteremia 10/11/2021  ? s/p IV cefazolin, po Linezolid; 10/16/21 TEE w/o vegetations.  ? Peripheral vascular disease ([place])   ? Pneumonia   ? as child, cough at present with no fever  ? Pneumonia   ? as child, cough at present with no fever   ? Radiation  11/19/11-01/08/12  ? 5040 cGy 28 fx Pelvis and inguinal area  ? Stroke [facility])   ? no weaknes or paralysis  ? Substance abuse ([place])   ? ? ?Social History  ? ?Socioeconomic History  ? Marital status: Widowed  ?  Spouse name: Not on file  ? Number of children: 3  ? Years of education: Not on file  ? Highest education level: Not on file  ?Occupational History  ? Occupation: unemployed  ?Tobacco Use  ? Smoking status: Every Day  ?  Packs/day: 1.00  ?  Years: 47.00  ?  Pack years: 47.00  ?  Types: Cigarettes  ? Smokeless tobacco: Never  ? Tobacco comments:  ?  Cutting back>> Quit Smart card supplied  ?Vaping Use  ? Vaping Use: Never used  ?Substance and Sexual Activity  ? Alcohol use: Not Currently  ?  Comment: stopped years ago  ? Drug use: Yes  ?  Types: Marijuana, Cocaine  ?  Comment: occasional marijuana, last Cocaine use 2-3 weeks (as of 12/03/21)  ? Sexual activity: Yes  ?  Birth control/protection: Condom  ?Other Topics Concern  ? Not on file  ?Social History  Narrative  ? Not on file  ? ?Social Determinants of Health  ? ?Financial Resource Strain: Not on file  ?Food Insecurity: Not on file  ?Transportation Needs: Not on file  ?Physical Activity: Not on file  ?Stress: Not on file  ?Social Connections: Not on file  ?Intimate Partner Violence: Not on file  ? ? ?Past Surgical History:  ?Procedure Laterality Date  ? APPENDECTOMY    ? age 61 or 56  ? EXAMINATION UNDER ANESTHESIA  10/14/2011  ? Procedure: EXAM UNDER ANESTHESIA;  Surgeon: Earnstine Regal, MD;  [facility];  Service: General;  Laterality: N/A;  exam under anethesia, Excision of mass anal canal, 1.5cm  ? INGUINAL HERNIA REPAIR  05/11/2012  ? Procedure: HERNIA REPAIR INGUINAL ADULT;  Surgeon: Earnstine Regal, MD;  Location: [place];  Service: General;  Laterality: Left;  left inguinal hernia repair with mesh  ? LOWER EXTREMITY ANGIOGRAPHY Left 07/02/2021  ? Procedure: LOWER EXTREMITY ANGIOGRAPHY;  Surgeon: Katha Cabal, MD;  Location: [place] [facility];  Service: Cardiovascular;  Laterality: Left;  ? LOWER EXTREMITY ANGIOGRAPHY Right 07/16/2021  ? Procedure: LOWER EXTREMITY ANGIOGRAPHY;  Surgeon: Katha Cabal, MD;  Location: [place] [facility];  Service: Cardiovascular;  Laterality: Right;  ? LOWER EXTREMITY ANGIOGRAPHY Left 08/27/2021  ? Procedure: LOWER EXTREMITY ANGIOGRAPHY;  Surgeon: Katha Cabal, MD;  Location: [place] [facility];  Service: Cardiovascular;  Laterality: Left;  ? SMALL INTESTINE SURGERY  04/19/2017  ? exploratory lap, partial small bowel resection for perforated ulcer/abcess, radiation enteritis  ? surgical pathology   10/14/2011  ? squamous cell ca of anus  ? TEE WITHOUT CARDIOVERSION N/A 10/16/2021  ? Procedure: TRANSESOPHAGEAL ECHOCARDIOGRAM (TEE);  Surgeon: [name], MD;  Location: [place];  Service: Cardiovascular;  Laterality: N/A;  ? TOOTH EXTRACTION N/A 12/04/2021  ? Procedure: DENTAL RESTORATION/EXTRACTIONS;  Surgeon: Diona Browner, DMD;  Location: [place];  Service: Oral Surgery;  Laterality: N/A;  ? transanal excision  10/14/2011  ? Dr.Todd Gerkin  ? ? ?Family History  ?Problem Relation Age of Onset  ? Colon cancer Father   ? Asthma Father   ? Cancer Father   ?     prostate  ? Hypertension Father   ? Asthma Mother   ? Hypertension Mother   ?  Stomach cancer Neg Hx   ? Esophageal cancer Neg Hx   ? ? ?Allergies  ?Allergen Reactions  ? Aspirin Swelling  ?  Lip swelling  ? Yellow Jacket Venom [Bee Venom] Anaphylaxis  ? Codeine Nausea Only  ? Ibuprofen   ?  On blood thinners  ? Morphine And Related Itching  ? ? ? ?  Latest Ref Rng & Units 12/04/2021  ?  6:12 AM 10/29/2021  ?  3:25 PM 10/16/2021  ?  1:17 AM  ?CBC  ?WBC 4.0 - 10.5 K/uL 8.5   10.4   7.8    ?Hemoglobin 13.0 - 17.0 g/dL 11.7   11.6   10.3    ?Hematocrit 39.0 - 52.0 % 34.7   34.3   30.3    ?Platelets 150 - 400 K/uL 150   166   107    ? ? ? ? ?CMP  ?   ?Component Value Date/Time  ? NA 135 12/04/2021 0612  ? NA 138 06/06/2021 1134  ? NA 141 10/19/2014 1204  ? K 4.1 12/04/2021 0612  ? K 4.6 10/19/2014 1204  ? CL 105 12/04/2021 0612  ? CO2 22 12/04/2021 0612  ? CO2 26 10/19/2014 1204  ? GLUCOSE 81 12/04/2021 0612  ? GLUCOSE 91 10/19/2014 1204  ? BUN 10 12/04/2021 0612  ? BUN 12 06/06/2021 1134  ? BUN 10.8 10/19/2014 1204  ? CREATININE 0.80 12/04/2021 0612  ? CREATININE 0.83 07/05/2015 1042  ? CREATININE 0.9 10/19/2014 1204  ? CALCIUM 9.1 12/04/2021 0612  ? CALCIUM 9.6 10/19/2014 1204  ? PROT 6.8 12/04/2021 0612  ? PROT 6.5 06/06/2021 1134  ? PROT 7.3 10/19/2014 1204  ? ALBUMIN 3.5 12/04/2021 0612  ? ALBUMIN 4.3 06/06/2021 1134  ? ALBUMIN 4.0 10/19/2014 1204  ? AST 32 12/04/2021 0612  ? AST 15 10/19/2014 1204  ? ALT 20 12/04/2021 0612  ? ALT 13 10/19/2014 1204  ? ALKPHOS 94 12/04/2021 0612  ? ALKPHOS 83 10/19/2014 1204  ? BILITOT 0.8 12/04/2021 0612  ? BILITOT 0.5 06/06/2021 1134  ? BILITOT 0.22 10/19/2014 1204  ? GFRNONAA >60 12/04/2021 0612  ? GFRNONAA >89 07/05/2015 1042  ? GFRAA >60  03/10/2020 1219  ? GFRAA >89 07/05/2015 1042  ? ? ? ?No results found. ? ?   ?Assessment & Plan:  ? ?1. Atherosclerosis of native artery of left lower extremity with rest pain ([place]) ?Recommend: ? ?The patient has evide

## 2022-01-08 NOTE — Progress Notes (Signed)
? ?Subjective:  ? ? Patient ID: Martin [name], male    DOB: 11-Aug-1961, 61 y.o.   MRN: [id number] ?No chief complaint on file. ? ? ?Martin [name] is a 61 year old male that returns to the office for followup and review of the noninvasive studies.  The patient most recently had intervention on 08/27/2021 of the left lower extremity. ? ?The patient notes that there has been a significant deterioration in the lower extremity symptoms.  The patient notes interval shortening of their claudication distance and development of mild rest pain symptoms. No new ulcers or wounds have occurred since the last visit. ? ?There have been no significant changes to the patient's overall health care. ? ?The patient denies amaurosis fugax or recent TIA symptoms. There are no recent neurological changes noted. ?There is no history of DVT, PE or superficial thrombophlebitis. ?The patient denies recent episodes of angina or shortness of breath.  ? ?ABI's Rt=1.04 and Lt=1.05 (previous ABI's Rt=1.12 and Lt=1.10) ?However with exercise the patient's ABIs are Rt=1.04 and Lt=.04  ? ? ?Duplex US of the lower extremity arterial system shows a 75 to 99% stenosis of the left SFA  ? ? ?Review of Systems  ?Skin:  Negative for wound.  ?All other systems reviewed and are negative. ? ?   ?Objective:  ? Physical Exam ?Vitals reviewed.  ?HENT:  ?   Head: Normocephalic.  ?Cardiovascular:  ?   Rate and Rhythm: Normal rate.  ?   Pulses:     ?     Dorsalis pedis pulses are 1+ on the right side and detected w/ Doppler on the left side.  ?     Posterior tibial pulses are 1+ on the right side and detected w/ Doppler on the left side.  ?Pulmonary:  ?   Effort: Pulmonary effort is normal.  ?Skin: ?   General: Skin is dry.  ?Neurological:  ?   Mental Status: He is alert and oriented to person, place, and time.  ?Psychiatric:     ?   Mood and Affect: Mood normal.     ?   Behavior: Behavior normal.     ?   Thought Content: Thought content normal.     ?   Judgment:  Judgment normal.  ? ? ?BP (!) 179/67 (BP Location: Right Arm)   Pulse 69   Resp 18   Ht 5' 7"  (1.702 m)   Wt 147 lb (66.7 kg)   BMI 23.02 kg/m?  ? ?Past Medical History:  ?Diagnosis Date  ? Allergy 10/2011  ? Anal cancer ([place]) 10/14/2011  ? 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 Anxiety   ? Arthritis   ? DDD lumbar, arthritis knees  ? COPD (chronic obstructive pulmonary disease) ([place])   ? DJD (degenerative joint disease) of lumbar spine   ? GERD (gastroesophageal reflux disease)   ? Hemorrhoid   ? internal  ? History of bowel resection 04/19/2017  ? Hypertension   ? Pt states he's never been told or treated for HTN  ? Illicit drug use   ? + UDS cocaine, THC, opiates 10/11/21  ? Inguinal hernia   ? MSSA bacteremia 10/11/2021  ? s/p IV cefazolin, po Linezolid; 10/16/21 TEE w/o vegetations.  ? Peripheral vascular disease ([place])   ? Pneumonia   ? as child, cough at present with no fever  ? Pneumonia   ? as child, cough at present with no fever   ? Radiation  11/19/11-01/08/12  ? 5040 cGy 28 fx Pelvis and inguinal area  ? Stroke [facility])   ? no weaknes or paralysis  ? Substance abuse ([place])   ? ? ?Social History  ? ?Socioeconomic History  ? Marital status: Widowed  ?  Spouse name: Not on file  ? Number of children: 3  ? Years of education: Not on file  ? Highest education level: Not on file  ?Occupational History  ? Occupation: unemployed  ?Tobacco Use  ? Smoking status: Every Day  ?  Packs/day: 1.00  ?  Years: 47.00  ?  Pack years: 47.00  ?  Types: Cigarettes  ? Smokeless tobacco: Never  ? Tobacco comments:  ?  Cutting back>> Quit Smart card supplied  ?Vaping Use  ? Vaping Use: Never used  ?Substance and Sexual Activity  ? Alcohol use: Not Currently  ?  Comment: stopped years ago  ? Drug use: Yes  ?  Types: Marijuana, Cocaine  ?  Comment: occasional marijuana, last Cocaine use 2-3 weeks (as of 12/03/21)  ? Sexual activity: Yes  ?  Birth control/protection: Condom  ?Other Topics Concern  ? Not on file  ?Social History  Narrative  ? Not on file  ? ?Social Determinants of Health  ? ?Financial Resource Strain: Not on file  ?Food Insecurity: Not on file  ?Transportation Needs: Not on file  ?Physical Activity: Not on file  ?Stress: Not on file  ?Social Connections: Not on file  ?Intimate Partner Violence: Not on file  ? ? ?Past Surgical History:  ?Procedure Laterality Date  ? APPENDECTOMY    ? age 76 or 33  ? EXAMINATION UNDER ANESTHESIA  10/14/2011  ? Procedure: EXAM UNDER ANESTHESIA;  Surgeon: Earnstine Regal, MD;  [facility];  Service: General;  Laterality: N/A;  exam under anethesia, Excision of mass anal canal, 1.5cm  ? INGUINAL HERNIA REPAIR  05/11/2012  ? Procedure: HERNIA REPAIR INGUINAL ADULT;  Surgeon: Earnstine Regal, MD;  Location: [place];  Service: General;  Laterality: Left;  left inguinal hernia repair with mesh  ? LOWER EXTREMITY ANGIOGRAPHY Left 07/02/2021  ? Procedure: LOWER EXTREMITY ANGIOGRAPHY;  Surgeon: Katha Cabal, MD;  Location: [place] [facility];  Service: Cardiovascular;  Laterality: Left;  ? LOWER EXTREMITY ANGIOGRAPHY Right 07/16/2021  ? Procedure: LOWER EXTREMITY ANGIOGRAPHY;  Surgeon: Katha Cabal, MD;  Location: [place] [facility];  Service: Cardiovascular;  Laterality: Right;  ? LOWER EXTREMITY ANGIOGRAPHY Left 08/27/2021  ? Procedure: LOWER EXTREMITY ANGIOGRAPHY;  Surgeon: Katha Cabal, MD;  Location: [place] [facility];  Service: Cardiovascular;  Laterality: Left;  ? SMALL INTESTINE SURGERY  04/19/2017  ? exploratory lap, partial small bowel resection for perforated ulcer/abcess, radiation enteritis  ? surgical pathology   10/14/2011  ? squamous cell ca of anus  ? TEE WITHOUT CARDIOVERSION N/A 10/16/2021  ? Procedure: TRANSESOPHAGEAL ECHOCARDIOGRAM (TEE);  Surgeon: [name], MD;  Location: [place];  Service: Cardiovascular;  Laterality: N/A;  ? TOOTH EXTRACTION N/A 12/04/2021  ? Procedure: DENTAL RESTORATION/EXTRACTIONS;  Surgeon: Diona Browner, DMD;  Location: [place];  Service: Oral Surgery;  Laterality: N/A;  ? transanal excision  10/14/2011  ? Dr.Todd Gerkin  ? ? ?Family History  ?Problem Relation Age of Onset  ? Colon cancer Father   ? Asthma Father   ? Cancer Father   ?     prostate  ? Hypertension Father   ? Asthma Mother   ? Hypertension Mother   ?  Stomach cancer Neg Hx   ? Esophageal cancer Neg Hx   ? ? ?Allergies  ?Allergen Reactions  ? Aspirin Swelling  ?  Lip swelling  ? Yellow Jacket Venom [Bee Venom] Anaphylaxis  ? Codeine Nausea Only  ? Ibuprofen   ?  On blood thinners  ? Morphine And Related Itching  ? ? ? ?  Latest Ref Rng & Units 12/04/2021  ?  6:12 AM 10/29/2021  ?  3:25 PM 10/16/2021  ?  1:17 AM  ?CBC  ?WBC 4.0 - 10.5 K/uL 8.5   10.4   7.8    ?Hemoglobin 13.0 - 17.0 g/dL 11.7   11.6   10.3    ?Hematocrit 39.0 - 52.0 % 34.7   34.3   30.3    ?Platelets 150 - 400 K/uL 150   166   107    ? ? ? ? ?CMP  ?   ?Component Value Date/Time  ? NA 135 12/04/2021 0612  ? NA 138 06/06/2021 1134  ? NA 141 10/19/2014 1204  ? K 4.1 12/04/2021 0612  ? K 4.6 10/19/2014 1204  ? CL 105 12/04/2021 0612  ? CO2 22 12/04/2021 0612  ? CO2 26 10/19/2014 1204  ? GLUCOSE 81 12/04/2021 0612  ? GLUCOSE 91 10/19/2014 1204  ? BUN 10 12/04/2021 0612  ? BUN 12 06/06/2021 1134  ? BUN 10.8 10/19/2014 1204  ? CREATININE 0.80 12/04/2021 0612  ? CREATININE 0.83 07/05/2015 1042  ? CREATININE 0.9 10/19/2014 1204  ? CALCIUM 9.1 12/04/2021 0612  ? CALCIUM 9.6 10/19/2014 1204  ? PROT 6.8 12/04/2021 0612  ? PROT 6.5 06/06/2021 1134  ? PROT 7.3 10/19/2014 1204  ? ALBUMIN 3.5 12/04/2021 0612  ? ALBUMIN 4.3 06/06/2021 1134  ? ALBUMIN 4.0 10/19/2014 1204  ? AST 32 12/04/2021 0612  ? AST 15 10/19/2014 1204  ? ALT 20 12/04/2021 0612  ? ALT 13 10/19/2014 1204  ? ALKPHOS 94 12/04/2021 0612  ? ALKPHOS 83 10/19/2014 1204  ? BILITOT 0.8 12/04/2021 0612  ? BILITOT 0.5 06/06/2021 1134  ? BILITOT 0.22 10/19/2014 1204  ? GFRNONAA >60 12/04/2021 0612  ? GFRNONAA >89 07/05/2015 1042  ? GFRAA >60  03/10/2020 1219  ? GFRAA >89 07/05/2015 1042  ? ? ? ?No results found. ? ?   ?Assessment & Plan:  ? ?1. Atherosclerosis of native artery of left lower extremity with rest pain ([place]) ?Recommend: ? ?The patient has evide

## 2022-01-10 ENCOUNTER — Telehealth (INDEPENDENT_AMBULATORY_CARE_PROVIDER_SITE_OTHER): Payer: Self-pay

## 2022-01-10 NOTE — Telephone Encounter (Signed)
Patient returned my call and is scheduled with Dr. Delana Meyer for a LLE angio on 01/14/22 with a  11:15 am arrival time to the [facility]. Pre-procedure instructions were discussed and patient understood and stated he was writing it down. ?

## 2022-01-13 DIAGNOSIS — F1120 Opioid dependence, uncomplicated: Secondary | ICD-10-CM | POA: Diagnosis not present

## 2022-01-14 ENCOUNTER — Telehealth (INDEPENDENT_AMBULATORY_CARE_PROVIDER_SITE_OTHER): Payer: Self-pay

## 2022-01-14 ENCOUNTER — Ambulatory Visit: Payer: Medicare HMO | Admitting: Certified Registered"

## 2022-01-14 ENCOUNTER — Other Ambulatory Visit: Payer: Self-pay

## 2022-01-14 ENCOUNTER — Other Ambulatory Visit (INDEPENDENT_AMBULATORY_CARE_PROVIDER_SITE_OTHER): Payer: Self-pay | Admitting: Vascular Surgery

## 2022-01-14 ENCOUNTER — Encounter
Admission: RE | Disposition: A | Discharge status: Home / Self Care | Payer: Self-pay | Source: Home / Self Care | Attending: Vascular Surgery

## 2022-01-14 ENCOUNTER — Ambulatory Visit
Admission: RE | Admit: 2022-01-14 | Discharge: 2022-01-14 | Disposition: A | Discharge status: Home / Self Care | Payer: Medicare HMO | Attending: Vascular Surgery | Admitting: Vascular Surgery

## 2022-01-14 ENCOUNTER — Encounter: Payer: Self-pay | Admitting: Vascular Surgery

## 2022-01-14 DIAGNOSIS — I7092 Chronic total occlusion of artery of the extremities: Secondary | ICD-10-CM | POA: Diagnosis not present

## 2022-01-14 DIAGNOSIS — I70222 Atherosclerosis of native arteries of extremities with rest pain, left leg: Secondary | ICD-10-CM

## 2022-01-14 DIAGNOSIS — I700 Atherosclerosis of aorta: Secondary | ICD-10-CM

## 2022-01-14 DIAGNOSIS — F17210 Nicotine dependence, cigarettes, uncomplicated: Secondary | ICD-10-CM | POA: Diagnosis not present

## 2022-01-14 DIAGNOSIS — E785 Hyperlipidemia, unspecified: Secondary | ICD-10-CM | POA: Diagnosis not present

## 2022-01-14 DIAGNOSIS — K219 Gastro-esophageal reflux disease without esophagitis: Secondary | ICD-10-CM | POA: Diagnosis not present

## 2022-01-14 DIAGNOSIS — I70219 Atherosclerosis of native arteries of extremities with intermittent claudication, unspecified extremity: Secondary | ICD-10-CM

## 2022-01-14 DIAGNOSIS — Z79891 Long term (current) use of opiate analgesic: Secondary | ICD-10-CM | POA: Insufficient documentation

## 2022-01-14 DIAGNOSIS — I70212 Atherosclerosis of native arteries of extremities with intermittent claudication, left leg: Secondary | ICD-10-CM | POA: Diagnosis not present

## 2022-01-14 HISTORY — PX: LOWER EXTREMITY ANGIOGRAPHY: CATH118251

## 2022-01-14 LAB — URINE DRUG SCREEN, QUALITATIVE (ARMC ONLY)
Amphetamines, Ur Screen: NOT DETECTED
Barbiturates, Ur Screen: NOT DETECTED
Benzodiazepine, Ur Scrn: NOT DETECTED
Cannabinoid 50 Ng, Ur ~~LOC~~: NOT DETECTED
Cocaine Metabolite,Ur ~~LOC~~: NOT DETECTED
MDMA (Ecstasy)Ur Screen: NOT DETECTED
Methadone Scn, Ur: POSITIVE — AB
Opiate, Ur Screen: NOT DETECTED
Phencyclidine (PCP) Ur S: NOT DETECTED
Tricyclic, Ur Screen: NOT DETECTED

## 2022-01-14 LAB — BUN: BUN: 14 mg/dL (ref 8–23)

## 2022-01-14 LAB — CREATININE, SERUM
Creatinine, Ser: 0.73 mg/dL (ref 0.61–1.24)
GFR, Estimated: >60 mL/min (ref >60)

## 2022-01-14 SURGERY — LOWER EXTREMITY ANGIOGRAPHY
Anesthesia: General | Site: Leg Lower | Laterality: Left

## 2022-01-14 MED ORDER — PROPOFOL 10 MG/ML IV BOLUS
INTRAVENOUS | Status: DC | PRN
  Administered 2022-01-14: 50 mg via INTRAVENOUS

## 2022-01-14 MED ORDER — DIPHENHYDRAMINE HCL 50 MG/ML IJ SOLN
INTRAMUSCULAR | Status: AC
  Filled 2022-01-14: qty 1

## 2022-01-14 MED ORDER — PROPOFOL 500 MG/50ML IV EMUL
INTRAVENOUS | Status: DC | PRN
  Administered 2022-01-14: 125 ug/kg/min via INTRAVENOUS

## 2022-01-14 MED ORDER — SODIUM CHLORIDE 0.9% FLUSH: 3.0000 mL | Freq: Two times a day (BID) | INTRAVENOUS | Status: DC

## 2022-01-14 MED ORDER — MORPHINE SULFATE (PF) 4 MG/ML IV SOLN: 2.0000 mg | INTRAVENOUS | Status: DC | PRN

## 2022-01-14 MED ORDER — HYDROMORPHONE HCL 1 MG/ML IJ SOLN: 1.0000 mg | Freq: Once | INTRAMUSCULAR | Status: DC | PRN

## 2022-01-14 MED ORDER — PROPOFOL 10 MG/ML IV BOLUS
INTRAVENOUS | Status: AC
  Filled 2022-01-14: qty 20

## 2022-01-14 MED ORDER — IODIXANOL 320 MG/ML IV SOLN
INTRAVENOUS | Status: DC | PRN
Start: 2022-01-14 — End: 2022-01-14
  Administered 2022-01-14: 75 mL via INTRA_ARTERIAL

## 2022-01-14 MED ORDER — SODIUM CHLORIDE 0.9 % IV SOLN: INTRAVENOUS | Status: DC

## 2022-01-14 MED ORDER — CEFAZOLIN SODIUM-DEXTROSE 2-4 GM/100ML-% IV SOLN
INTRAVENOUS | Status: AC
  Filled 2022-01-14: qty 100

## 2022-01-14 MED ORDER — EPHEDRINE SULFATE (PRESSORS) 50 MG/ML IJ SOLN
INTRAMUSCULAR | Status: DC | PRN
Start: 2022-01-14 — End: 2022-01-14
  Administered 2022-01-14: 10 mg via INTRAVENOUS

## 2022-01-14 MED ORDER — LIDOCAINE HCL (CARDIAC) PF 100 MG/5ML IV SOSY
PREFILLED_SYRINGE | INTRAVENOUS | Status: DC | PRN
  Administered 2022-01-14: 100 mg via INTRAVENOUS

## 2022-01-14 MED ORDER — HEPARIN SODIUM (PORCINE) 1000 UNIT/ML IJ SOLN
INTRAMUSCULAR | Status: DC | PRN
  Administered 2022-01-14: 5000 [IU] via INTRAVENOUS

## 2022-01-14 MED ORDER — OXYCODONE HCL 5 MG/5ML PO SOLN
5.0000 mg | Freq: Once | ORAL | Status: DC | PRN
  Filled 2022-01-14: qty 5

## 2022-01-14 MED ORDER — DIPHENHYDRAMINE HCL 50 MG/ML IJ SOLN
INTRAMUSCULAR | Status: DC | PRN
  Administered 2022-01-14: 12.5 mg via INTRAVENOUS

## 2022-01-14 MED ORDER — ONDANSETRON HCL 4 MG/2ML IJ SOLN: 4.0000 mg | Freq: Once | INTRAMUSCULAR | Status: DC | PRN

## 2022-01-14 MED ORDER — METHYLPREDNISOLONE SODIUM SUCC 125 MG IJ SOLR: 125.0000 mg | Freq: Once | INTRAMUSCULAR | Status: DC | PRN

## 2022-01-14 MED ORDER — FENTANYL CITRATE (PF) 100 MCG/2ML IJ SOLN
INTRAMUSCULAR | Status: DC | PRN
  Administered 2022-01-14: 50 ug via INTRAVENOUS

## 2022-01-14 MED ORDER — MIDAZOLAM HCL 2 MG/ML PO SYRP
8.0000 mg | ORAL_SOLUTION | Freq: Once | ORAL | Status: DC | PRN
Start: 2022-01-14 — End: 2022-01-14

## 2022-01-14 MED ORDER — FENTANYL CITRATE (PF) 100 MCG/2ML IJ SOLN: 25.0000 ug | INTRAMUSCULAR | Status: DC | PRN

## 2022-01-14 MED ORDER — ACETAMINOPHEN 325 MG PO TABS: 650.0000 mg | ORAL_TABLET | ORAL | Status: DC | PRN

## 2022-01-14 MED ORDER — FENTANYL CITRATE (PF) 100 MCG/2ML IJ SOLN
INTRAMUSCULAR | Status: AC
  Filled 2022-01-14: qty 2

## 2022-01-14 MED ORDER — CEFAZOLIN SODIUM-DEXTROSE 2-4 GM/100ML-% IV SOLN
2.0000 g | INTRAVENOUS | Status: AC
  Administered 2022-01-14: 2 g via INTRAVENOUS

## 2022-01-14 MED ORDER — FAMOTIDINE 20 MG PO TABS: 40.0000 mg | ORAL_TABLET | Freq: Once | ORAL | Status: DC | PRN

## 2022-01-14 MED ORDER — LIDOCAINE HCL (PF) 2 % IJ SOLN
INTRAMUSCULAR | Status: AC
  Filled 2022-01-14: qty 5

## 2022-01-14 MED ORDER — ONDANSETRON HCL 4 MG/2ML IJ SOLN
4.0000 mg | Freq: Four times a day (QID) | INTRAMUSCULAR | Status: DC | PRN
Start: 2022-01-14 — End: 2022-01-14

## 2022-01-14 MED ORDER — HYDRALAZINE HCL 20 MG/ML IJ SOLN: 5.0000 mg | INTRAMUSCULAR | Status: DC | PRN

## 2022-01-14 MED ORDER — APIXABAN 2.5 MG PO TABS: 2.5000 mg | ORAL_TABLET | Freq: Two times a day (BID) | ORAL | 6 refills | Status: DC

## 2022-01-14 MED ORDER — OXYCODONE HCL 5 MG PO TABS: 5.0000 mg | ORAL_TABLET | ORAL | Status: DC | PRN

## 2022-01-14 MED ORDER — LABETALOL HCL 5 MG/ML IV SOLN: 10.0000 mg | INTRAVENOUS | Status: DC | PRN

## 2022-01-14 MED ORDER — SODIUM CHLORIDE 0.9 % IV SOLN
250.0000 mL | INTRAVENOUS | Status: DC | PRN
Start: 2022-01-14 — End: 2022-01-14

## 2022-01-14 MED ORDER — MIDAZOLAM HCL 2 MG/2ML IJ SOLN
INTRAMUSCULAR | Status: DC | PRN
  Administered 2022-01-14: 2 mg via INTRAVENOUS

## 2022-01-14 MED ORDER — ACETAMINOPHEN 10 MG/ML IV SOLN: 1000.0000 mg | Freq: Once | INTRAVENOUS | Status: DC | PRN

## 2022-01-14 MED ORDER — MIDAZOLAM HCL 2 MG/2ML IJ SOLN
INTRAMUSCULAR | Status: AC
Start: 2022-01-14 — End: ?
  Filled 2022-01-14: qty 2

## 2022-01-14 MED ORDER — SODIUM CHLORIDE 0.9% FLUSH: 3.0000 mL | INTRAVENOUS | Status: DC | PRN

## 2022-01-14 MED ORDER — DIPHENHYDRAMINE HCL 50 MG/ML IJ SOLN: 50.0000 mg | Freq: Once | INTRAMUSCULAR | Status: DC | PRN

## 2022-01-14 MED ORDER — LACTATED RINGERS IV SOLN: INTRAVENOUS | Status: DC

## 2022-01-14 MED ORDER — OXYCODONE HCL 5 MG PO TABS: 5.0000 mg | ORAL_TABLET | Freq: Once | ORAL | Status: DC | PRN

## 2022-01-14 MED ORDER — ONDANSETRON HCL 4 MG/2ML IJ SOLN: 4.0000 mg | Freq: Four times a day (QID) | INTRAMUSCULAR | Status: DC | PRN

## 2022-01-14 SURGICAL SUPPLY — 23 items
BALLN LUTONIX 018 4X40X130 (BALLOONS) ×2
BALLN LUTONIX 5X220X130 (BALLOONS) ×2
BALLN LUTONIX DCB 5X40X130 (BALLOONS) ×2
BALLN ULTRASCORE 014 3X40X150 (BALLOONS) ×2
BALLOON LUTONIX 018 4X40X130 (BALLOONS) IMPLANT
BALLOON LUTONIX 5X220X130 (BALLOONS) IMPLANT
BALLOON LUTONIX DCB 5X40X130 (BALLOONS) IMPLANT
BALLOON ULTRSCRE 014 3X40X150 (BALLOONS) IMPLANT
CANNULA 5F STIFF (CANNULA) ×1 IMPLANT
CATH ANGIO 5F PIGTAIL 65CM (CATHETERS) ×1 IMPLANT
CATH VERT 5FR 125CM (CATHETERS) ×1 IMPLANT
COVER PROBE U/S 5X48 (MISCELLANEOUS) ×1 IMPLANT
DEVICE STARCLOSE SE CLOSURE (Vascular Products) ×1 IMPLANT
GLIDEWIRE ADV .035X260CM (WIRE) ×1 IMPLANT
KIT ENCORE 26 ADVANTAGE (KITS) ×1 IMPLANT
PACK ANGIOGRAPHY (CUSTOM PROCEDURE TRAY) ×2 IMPLANT
SHEATH BRITE TIP 5FRX11 (SHEATH) ×1 IMPLANT
SHEATH RAABE 6FR (SHEATH) ×1 IMPLANT
STENT LIFESTENT 6X170X130 (Permanent Stent) ×1 IMPLANT
SYR MEDRAD MARK 7 150ML (SYRINGE) ×1 IMPLANT
TUBING CONTRAST HIGH PRESS 72 (TUBING) ×1 IMPLANT
WIRE GUIDERIGHT .035X150 (WIRE) ×1 IMPLANT
WIRE RUNTHROUGH .014X300CM (WIRE) ×1 IMPLANT

## 2022-01-14 NOTE — Anesthesia Preprocedure Evaluation (Signed)
Anesthesia Evaluation  ?Patient identified by MRN, date of birth, ID band ?Patient awake ? ? ? ?Reviewed: ?Allergy & Precautions, NPO status , Patient's Chart, lab work & pertinent test results ? ?History of Anesthesia Complications ?Negative for: history of anesthetic complications ? ?Airway ?Mallampati: III ? ? ?Neck ROM: Full ? ? ? Dental ? ?(+) Edentulous Upper, Edentulous Lower ?  ?Pulmonary ?COPD, Current Smoker (1 ppd)Patient did not abstain from smoking.,  ?  ?Pulmonary exam normal ?breath sounds clear to auscultation ? ? ? ? ? ? Cardiovascular ?hypertension, + Peripheral Vascular Disease (on Plavix and Eliquis)  ?Normal cardiovascular exam ?Rhythm:Regular Rate:Normal ? ? ?  ?Neuro/Psych ?PSYCHIATRIC DISORDERS Anxiety Depression Hx polysubstance use disorder; last cocaine use few months ago ?CVA, No Residual Symptoms   ? GI/Hepatic ?GERD  ,  ?Endo/Other  ?negative endocrine ROS ? Renal/GU ?Renal disease  ? ?  ?Musculoskeletal ? ?(+) Arthritis ,  ? Abdominal ?  ?Peds ? Hematology ? ?(+) Blood dyscrasia, anemia ,   ?Anesthesia Other Findings ? ? Reproductive/Obstetrics ? ?  ? ? ? ? ? ? ? ? ? ? ? ? ? ?  ?  ? ? ? ? ? ? ? ? ?Anesthesia Physical ?Anesthesia Plan ? ?ASA: 3 ? ?Anesthesia Plan: General  ? ?Post-op Pain Management:   ? ?Induction: Intravenous ? ?PONV Risk Score and Plan: 1 and Propofol infusion, TIVA, Treatment may vary due to age or medical condition and Ondansetron ? ?Airway Management Planned: Natural Airway ? ?Additional Equipment:  ? ?Intra-op Plan:  ? ?Post-operative Plan:  ? ?Informed Consent: I have reviewed the patients History and Physical, chart, labs and discussed the procedure including the risks, benefits and alternatives for the proposed anesthesia with the patient or authorized representative who has indicated his/her understanding and acceptance.  ? ? ? ? ? ?Plan Discussed with: CRNA ? ?Anesthesia Plan Comments: (LMA/GETA backup discussed.  Patient  consented for risks of anesthesia including but not limited to:  ?- adverse reactions to medications ?- damage to eyes, teeth, lips or other oral mucosa ?- nerve damage due to positioning  ?- sore throat or hoarseness ?- damage to heart, brain, nerves, lungs, other parts of body or loss of life ? ?Informed patient about role of CRNA in peri- and intra-operative care.  Patient voiced understanding.)  ? ? ? ? ? ? ?Anesthesia Quick Evaluation ? ?

## 2022-01-14 NOTE — Telephone Encounter (Signed)
Patient called stating he was not told what time to be at the [facility] for his procedure today. I explained that when I spoke with him on 01/10/22 I gave him that information and he stated he wrote it down. Patient stated lots of people gave him different times. I advised that he be at the [facility] at 11:15 am to check in and go to special procedures. ?

## 2022-01-14 NOTE — Discharge Instructions (Signed)
Restart Eliquis in AM 01/15/2022 ?

## 2022-01-14 NOTE — Interval H&P Note (Signed)
History and Physical Interval Note: ? ?01/14/2022 ?12:10 PM ? ?Martin [name]  has presented today for surgery, with the diagnosis of LLE Angio   BARD  ANESTHESIA   ASO w claudication.  The various methods of treatment have been discussed with the patient and family. After consideration of risks, benefits and other options for treatment, the patient has consented to  Procedure(s): ?Lower Extremity Angiography (Left) as a surgical intervention.  The patient's history has been reviewed, patient examined, no change in status, stable for surgery.  I have reviewed the patient's chart and labs.  Questions were answered to the patient's satisfaction.   ? ? ?[name] ? ? ?

## 2022-01-14 NOTE — Op Note (Signed)
[facility] ? Percutaneous Study/Intervention Procedural Note ? ? ?Date of Surgery: 01/14/2022 ? ?Surgeon:  [name] ? ?Pre-operative Diagnosis: Atherosclerotic occlusive disease bilateral lower extremities with lifestyle limiting claudication of the left lower extremity associated with mild rest pain. ? ?Post-operative diagnosis:  Same ? ?Procedure(s) Performed: ?            1.  Introduction catheter into left lower extremity 3rd order catheter placement   ?            2.    Contrast injection left lower extremity for distal runoff ?            3.  Percutaneous transluminal angioplasty and stent placement left superficial femoral and popliteal arteries to 5 mm ?            4.  Percutaneous transluminal angioplasty left anterior tibial to 4 mm ?            5.  Star close closure right common femoral arteriotomy ? ?Anesthesia: General anesthesia. ? ?Sheath: 6 [place] Rabie right common femoral retrograde ? ?Contrast: 75 cc ? ?Fluoroscopy Time: 6.2 minutes ? ?Indications:  Martin [name] presents with increasing pain of the left lower extremity.  Noninvasive studies suggest a greater than 75% stenosis within the superficial femoral artery.  This suggests the patient is having limb threatening ischemia. The risks and benefits are reviewed all questions answered patient agrees to proceed. ? ?Procedure:NAME@ is a 61 y.o. y.o. male who was identified and appropriate procedural time out was performed.  The patient was then placed supine on the table and prepped and draped in the usual sterile fashion.   ? ?Ultrasound was placed in the sterile sleeve and the right groin was evaluated the right common femoral artery was echolucent and pulsatile indicating patency.  Image was recorded for the permanent record and under real-time visualization a microneedle was inserted into the common femoral artery followed by the microwire and then the micro-sheath.  A J-wire was then advanced through the  micro-sheath and a  5 [place] sheath was then inserted over a J-wire. J-wire was then advanced and a 5 French pigtail catheter was positioned at the level of T12.  AP projection of the aorta was then obtained. Pigtail catheter was repositioned to above the bifurcation and a RAO view of the pelvis was obtained.  Subsequently a pigtail catheter with an Advantage wire was used to cross the aortic bifurcation.  The catheter and wire were advanced down into the left distal external iliac artery. Oblique view of the femoral bifurcation was then obtained and subsequently the wire was reintroduced and the pigtail catheter negotiated into the SFA representing third order catheter placement. Distal runoff was then performed. ? ?Diagnostic interpretation: ?The abdominal aorta is opacified with a bolus injection contrast.  Diffuse calcifications are noted but there are no hemodynamically significant stenoses noted.  Single renal arteries are noted bilaterally with normal nephrograms.  The aortic bifurcation is patent as is the bilateral common iliac arteries.  Previously placed right common iliac artery stent is widely patent.  External iliac arteries are widely patent bilaterally. ? ?The left common femoral is widely patent and free of recurrent disease.  The profunda femoris is widely patent although it is diffusely diseased there are no hemodynamically significant stenoses noted.  The superficial femoral artery is widely patent proximally.  In its midportion there are tandem greater than 80% stenoses with a third lesion located at [facility] canal of  greater than 70%.  These lesions are associated with diffuse atherosclerotic changes.  The total length of these lesions is approximately 20 cm.  Distal to Hunter's canal the popliteal artery is diffusely diseased but free of hemodynamically significant stenoses.  Trifurcation demonstrates significant atherosclerotic changes with a subtotal occlusion within the tibioperoneal trunk  the peroneal and posterior tibial are quite small and appeared diffusely diseased.  Anterior tibial demonstrates an 80% stenosis at its origin distal to this lesion it appears to be widely patent down to the foot filling the dorsalis pedis and the pedal arch. ? ?5000 units of heparin was then given and allowed to circulate for several minutes.  A 6 French Rabie sheath was advanced up and over the bifurcation and positioned in the femoral artery. ? ?KMP catheter and advantage Glidewire were then negotiated down into the distal popliteal. Catheter was then advanced. Hand injection contrast confirmed intraluminal positioning demonstrated the tibial anatomy in further detail.  A 6 mm x 175 mm life stent was then deployed across the superficial femoral and popliteal artery lesions.  A 5 mm x 220 mm Lutonix drug-eluting balloon was used to angioplasty the SFA and popliteal.  The inflation was for 1 minute at 12 atm. Follow-up imaging demonstrated patency with less than 10% residual stenosis. ? ?The detector was then positioned distally. A 3 mm x 40 mm ultra score balloon was advanced across the anterior tibial lesion.  Inflation was to 12 atmospheres for 1 minute. Follow-up imaging demonstrated greater than 50% residual stenosis and therefore a 4 mm x 40 mm Lutonix drug-eluting balloon was advanced over the wire across the anterior tibial lesion inflated to 8 atm for 1 minute.  Follow-up imaging demonstrated patency with less than 10% residual stenosis. Distal runoff was then reassessed and noted to be widely patent.   ? ?After review of these images the sheath is pulled into the right external iliac oblique of the common femoral is obtained and a Star close device deployed. There no immediate Complications. ? ?Findings:   ?The abdominal aorta is opacified with a bolus injection contrast.  Diffuse calcifications are noted but there are no hemodynamically significant stenoses noted.  Single renal arteries are noted  bilaterally with normal nephrograms.  The aortic bifurcation is patent as is the bilateral common iliac arteries.  Previously placed right common iliac artery stent is widely patent.  External iliac arteries are widely patent bilaterally. ? ?The left common femoral is widely patent and free of recurrent disease.  The profunda femoris is widely patent although it is diffusely diseased there are no hemodynamically significant stenoses noted.  The superficial femoral artery is widely patent proximally.  In its midportion there are tandem greater than 80% stenoses with a third lesion located at [facility] canal of greater than 70%.  These lesions are associated with diffuse atherosclerotic changes.  The total length of these lesions is approximately 20 cm.  Distal to Hunter's canal the popliteal artery is diffusely diseased but free of hemodynamically significant stenoses.  Trifurcation demonstrates significant atherosclerotic changes with a subtotal occlusion within the tibioperoneal trunk the peroneal and posterior tibial are quite small and appeared diffusely diseased.  Anterior tibial demonstrates an 80% stenosis at its origin distal to this lesion it appears to be widely patent down to the foot filling the dorsalis pedis and the pedal arch. ? ?Following angioplasty anterior tibial now is in-line flow and looks quite nice with less than 10% residual stenosis. Angioplasty and stent placement of the  mid SFA through the anterior Hunter's canal lesion yields an excellent result with less than 10% residual stenosis. ? ?Summary: Successful recanalization left anterior lower extremity for limb salvage ?                         ? ?Disposition: Patient was taken to the recovery room in stable condition having tolerated the procedure well. ? ?[name] ?01/14/2022,3:25 PM  ?

## 2022-01-14 NOTE — Progress Notes (Signed)
Pt. Groin with slight ooze; drsg. Saturated with bright red blood. Manual pressure held x 5 min. Epi SQ injected by T. [place], Tech : pt. Tolerated well. Pt. States he strained while trying to urinate via urinal. New drsg.4x4 (sterile) And tegaderm applied to site. Pt. Tolerated well.  ?

## 2022-01-14 NOTE — Transfer of Care (Signed)
Immediate Anesthesia Transfer of Care Note ? ?Patient: Martin [name] ? ?Procedure(s) Performed: Lower Extremity Angiography (Left: Leg Lower) ? ?Patient Location: Cath Lab ? ?Anesthesia Type:MAC ? ?Level of Consciousness: drowsy ? ?Airway & Oxygen Therapy: Patient Spontanous Breathing and Patient connected to face mask oxygen ? ?Post-op Assessment: Report given to RN and Post -op Vital signs reviewed and stable ? ?Post vital signs: Reviewed and stable ? ?Last Vitals:  ?Vitals Value Taken Time  ?BP 126/49   ?Temp    ?Pulse 57   ?Resp 10   ?SpO2 100   ? ? ?Last Pain:  ?Vitals:  ? 01/14/22 1126  ?TempSrc: Oral  ?PainSc: 0-No pain  ?   ? ?  ? ?Complications: There were no known notable events for this encounter. ?

## 2022-01-14 NOTE — Anesthesia Procedure Notes (Signed)
Procedure Name: [place] ?Date/Time: 01/14/2022 1:35 PM ?Performed by: Jerrye Noble, CRNA ?Pre-anesthesia Checklist: Patient identified, Emergency Drugs available, Suction available and Patient being monitored ?Patient Re-evaluated:Patient Re-evaluated prior to induction ?Oxygen Delivery Method: Simple face mask ? ? ? ? ?

## 2022-01-14 NOTE — Anesthesia Postprocedure Evaluation (Signed)
Anesthesia Post Note ? ?Patient: Martin [name] ? ?Procedure(s) Performed: Lower Extremity Angiography (Left: Leg Lower) ? ?Patient location during evaluation: PACU ?Anesthesia Type: General ?Level of consciousness: awake and alert, oriented and patient cooperative ?Pain management: pain level controlled ?Vital Signs Assessment: post-procedure vital signs reviewed and stable ?Respiratory status: spontaneous breathing, nonlabored ventilation and respiratory function stable ?Cardiovascular status: blood pressure returned to baseline and stable ?Postop Assessment: adequate PO intake ?Anesthetic complications: no ? ? ?There were no known notable events for this encounter. ? ? ?Last Vitals:  ?Vitals:  ? 01/14/22 1530 01/14/22 1545  ?BP: (!) 146/52 (!) 129/52  ?Pulse: (!) 58 (!) 57  ?Resp: 14 15  ?Temp:    ?SpO2: 99% 99%  ?  ?Last Pain:  ?Vitals:  ? 01/14/22 1530  ?TempSrc:   ?PainSc: 0-No pain  ? ? ?  ?  ?  ?  ?  ?  ? ?Darrin Nipper ? ? ? ? ?

## 2022-01-15 ENCOUNTER — Encounter: Payer: Self-pay | Admitting: Vascular Surgery

## 2022-01-17 ENCOUNTER — Ambulatory Visit (HOSPITAL_COMMUNITY)
Admission: EM | Admit: 2022-01-17 | Discharge: 2022-01-17 | Disposition: A | Discharge status: Home / Self Care | Payer: Medicare HMO | Attending: Physician Assistant | Admitting: Physician Assistant

## 2022-01-17 DIAGNOSIS — L291 Pruritus scroti: Secondary | ICD-10-CM | POA: Diagnosis not present

## 2022-01-17 DIAGNOSIS — B356 Tinea cruris: Secondary | ICD-10-CM

## 2022-01-17 MED ORDER — ECONAZOLE NITRATE 1 % EX CREA: TOPICAL_CREAM | Freq: Every day | CUTANEOUS | 0 refills | Status: DC

## 2022-01-17 NOTE — ED Triage Notes (Signed)
Rash under penis x 10 years.  ?

## 2022-01-17 NOTE — Discharge Instructions (Addendum)
Advised to use 1% cortisone cream over-the-counter. ?Advised to mix the prescription Spectazole with the cortisone cream 5050 combination and applied to the area 3 times daily. ?

## 2022-01-17 NOTE — ED Provider Notes (Signed)
?[facility] ? ? ? ?CSN: [id number] ?Arrival date & time: 01/17/22  1203 ? ? ?  ? ?History   ?Chief Complaint ?Chief Complaint  ?Patient presents with  ? Rash  ? ? ?HPI ?LONG Martin [name] is a 61 y.o. male.  ? ?61 year old male presents with rash in the groin area patient relates for the past 10 years he has had intermittent rash in the groin area.  He relates that this rash started secondary to having rectal cancer and receiving chemotherapy and radiation patient relates that the rash is intermittent usually confined to the underside of the scrotum bilaterally.  He relates that he has intense itching and irritation from the area that causes him to scratch until he bleeds.  Patient relates that he has used multiple over-the-counter topical creams without relief over the past couple weeks.  Patient does relate he did use a cream that was given by prescription by his surgeon that provided relief but he has not had this cream in many years.  Patient does not recall the name. ? ? ?Rash ? ?Past Medical History:  ?Diagnosis Date  ? Allergy 10/2011  ? Anal cancer ([place]) 10/14/2011  ? 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 Anxiety   ? Arthritis   ? DDD lumbar, arthritis knees  ? COPD (chronic obstructive pulmonary disease) ([place])   ? DJD (degenerative joint disease) of lumbar spine   ? GERD (gastroesophageal reflux disease)   ? Hemorrhoid   ? internal  ? History of bowel resection 04/19/2017  ? Hypertension   ? Pt states he's never been told or treated for HTN  ? Illicit drug use   ? + UDS cocaine, THC, opiates 10/11/21  ? Inguinal hernia   ? MSSA bacteremia 10/11/2021  ? s/p IV cefazolin, po Linezolid; 10/16/21 TEE w/o vegetations.  ? Peripheral vascular disease (Martin [name])   ? Pneumonia   ? as child, cough at present with no fever  ? Pneumonia   ? as child, cough at present with no fever   ? Radiation 11/19/11-01/08/12  ? 5040 cGy 28 fx Pelvis and inguinal area  ? Stroke Martin [name])   ? no weaknes or paralysis  ? Substance  abuse (Martin [name])   ? ? ?Patient Active Problem List  ? Diagnosis Date Noted  ? Sepsis 10/12/2021  ? Peripheral vascular disease   ? AKI   ? Tobacco use   ? Emphysema   ? BPH    ? Periodontal disease   ? IVDU, cocaine use   ? Demand ischemia   ? Thrombocytopenia   ? Normocytic anemia   ? MSSA bacteremia 10/11/2021  ? Callus 06/12/2021  ? Hav (hallux abducto valgus), unspecified laterality 06/12/2021  ? Hammer toes, bilateral 06/12/2021  ? Atherosclerosis of native arteries of extremity with intermittent claudication (Martin [name]) 06/12/2021  ? Hyperlipidemia 06/07/2021  ? 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 Elevated blood-pressure reading, without diagnosis of hypertension 09/26/2020  ? Body mass index (BMI) 29.0-29.9, adult 08/29/2020  ? Partial small bowel obstruction (Martin [name]) 03/10/2020  ? Small bowel obstruction due to adhesions (Martin [name]) 03/08/2020  ? Enteritis 10/15/2019  ? Abnormal liver function 10/15/2019  ? Leukocytosis 10/15/2019  ? Nausea & vomiting 10/15/2019  ? GAD (generalized anxiety disorder) 05/18/2017  ? 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 Constipation 04/11/2017  ? Bee sting 04/05/2017  ? Diverticulosis 11/29/2014  ? Healthcare maintenance 11/06/2014  ? Cough 10/05/2014  ? Insomnia 09/13/2014  ? Erectile dysfunction 11/30/2013  ?  Chronic pain syndrome 11/30/2013  ? Depression 11/30/2013  ? GERD (gastroesophageal reflux disease)   ? Hypertension   ? Anxiety   ? Arthritis   ? Osteoarthritis of lumbar spine   ? Radiation   ? Night sweats 08/12/2012  ? Tobacco abuse counseling 05/26/2012  ? 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 Vitamin B12 deficiency 03/19/2012  ? Folate deficiency 03/19/2012  ? Inguinal hernia 11/24/2011  ? Cancer (Martin [name]) 10/14/2011  ? Internal hemorrhoid 09/11/2011  ? Anal cancer 10/09/2010  ? ? ?Past Surgical History:  ?Procedure Laterality Date  ? APPENDECTOMY    ? age 15 or 89  ? EXAMINATION UNDER ANESTHESIA  10/14/2011  ? Procedure: EXAM UNDER ANESTHESIA;  Surgeon: Earnstine Regal, MD;   Location: [facility];  Service: General;  Laterality: N/A;  exam under anethesia, Excision of mass anal canal, 1.5cm  ? INGUINAL HERNIA REPAIR  05/11/2012  ? Procedure: HERNIA REPAIR INGUINAL ADULT;  Surgeon: Earnstine Regal, MD;  Location: [place];  Service: General;  Laterality: Left;  left inguinal hernia repair with mesh  ? LOWER EXTREMITY ANGIOGRAPHY Left 07/02/2021  ? Procedure: LOWER EXTREMITY ANGIOGRAPHY;  Surgeon: Katha Cabal, MD;  Location: [place] [facility];  Service: Cardiovascular;  Laterality: Left;  ? LOWER EXTREMITY ANGIOGRAPHY Right 07/16/2021  ? Procedure: LOWER EXTREMITY ANGIOGRAPHY;  Surgeon: Katha Cabal, MD;  Location: [place] [facility];  Service: Cardiovascular;  Laterality: Right;  ? LOWER EXTREMITY ANGIOGRAPHY Left 08/27/2021  ? Procedure: LOWER EXTREMITY ANGIOGRAPHY;  Surgeon: Katha Cabal, MD;  Location: [place] [facility];  Service: Cardiovascular;  Laterality: Left;  ? LOWER EXTREMITY ANGIOGRAPHY Left 01/14/2022  ? Procedure: Lower Extremity Angiography;  Surgeon: Katha Cabal, MD;  Location: [place] [facility];  Service: Cardiovascular;  Laterality: Left;  ? SMALL INTESTINE SURGERY  04/19/2017  ? exploratory lap, partial small bowel resection for perforated ulcer/abcess, radiation enteritis  ? surgical pathology   10/14/2011  ? squamous cell ca of anus  ? TEE WITHOUT CARDIOVERSION N/A 10/16/2021  ? Procedure: TRANSESOPHAGEAL ECHOCARDIOGRAM (TEE);  Surgeon: [name], MD;  Location: [place];  Service: Cardiovascular;  Laterality: N/A;  ? TOOTH EXTRACTION N/A 12/04/2021  ? Procedure: DENTAL RESTORATION/EXTRACTIONS;  Surgeon: Diona Browner, DMD;  Location: [place];  Service: Oral Surgery;  Laterality: N/A;  ? transanal excision  10/14/2011  ? Dr.Todd Gerkin  ? ? ? ? ? ?Home Medications   ? ?Prior to Admission medications   ?Medication Sig Start Date End Date Taking? Authorizing Provider  ?econazole nitrate 1 % cream Apply topically  daily. 01/17/22  Yes Nyoka Lint, PA-C  ?acetaminophen (TYLENOL) 500 MG tablet Take 1,000 mg by mouth every 6 (six) hours as needed for mild pain.    [provider]  ?albuterol (VENTOLIN HFA) 108 (90 Base) MCG/ACT inhaler INHALE 2 PUFFS BY MOUTH EVERY 6 HOURS AS NEEDED FOR WHEEZING FOR SHORTNESS OF BREATH 11/18/21   Camillia Herter, NP  ?amoxicillin (AMOXIL) 500 MG capsule Take 1 capsule (500 mg total) by mouth 3 (three) times daily. ?Patient not taking: Reported on 01/14/2022 12/04/21   Diona Browner, DMD  ?apixaban (ELIQUIS) 2.5 MG TABS tablet Take 1 tablet (2.5 mg total) by mouth 2 (two) times daily. 01/14/22   Schnier, Dolores Lory, MD  ?clopidogrel (PLAVIX) 75 MG tablet Take 1 tablet (75 mg total) by mouth daily. 07/03/21   Schnier, Dolores Lory, MD  ?EPINEPHrine (EPIPEN 2-PAK) 0.3 mg/0.3 mL IJ SOAJ injection Inject 0.3 mLs (  0.3 mg total) into the muscle as needed for anaphylaxis. ?Patient not taking: Reported on 01/08/2022 02/23/20   Margette Fast, MD  ?folic acid (FOLVITE) 1 MG tablet Take 1 tablet (1 mg total) by mouth daily. 10/18/21   Elgergawy, Silver Huguenin, MD  ?methadone (DOLOPHINE) 10 MG/ML solution Take 70 mg by mouth daily. Confirmed with [facility] 10/12/21    [provider]  ?tamsulosin (FLOMAX) 0.4 MG CAPS capsule Take 1 capsule (0.4 mg total) by mouth at bedtime. 09/19/21   Camillia Herter, NP  ?vitamin B-12 (CYANOCOBALAMIN) 1000 MCG tablet Take 1 tablet (1,000 mcg total) by mouth daily. ?Patient not taking: Reported on 11/29/2021 10/17/21   Elgergawy, Silver Huguenin, MD  ?sucralfate (CARAFATE) 1 GM/10ML suspension Take 10 mLs (1 g total) by mouth 4 (four) times daily -  with meals and at bedtime. ?Patient not taking: Reported on 11/28/2019 10/21/19 06/13/20  Randal Buba, April, MD  ? ? ?Family History ?Family History  ?Problem Relation Age of Onset  ? Colon cancer Father   ? Asthma Father   ? Cancer Father   ?     prostate  ? Hypertension Father   ? Asthma Mother   ? Hypertension Mother   ? Stomach  cancer Neg Hx   ? Esophageal cancer Neg Hx   ? ? ?Social History ?Social History  ? ?Tobacco Use  ? Smoking status: Every Day  ?  Packs/day: 1.00  ?  Years: 47.00  ?  Pack years: 47.00  ?  Types: Cigarettes  ? Sm

## 2022-01-20 DIAGNOSIS — F1120 Opioid dependence, uncomplicated: Secondary | ICD-10-CM | POA: Diagnosis not present

## 2022-01-22 ENCOUNTER — Ambulatory Visit (INDEPENDENT_AMBULATORY_CARE_PROVIDER_SITE_OTHER): Payer: Medicare HMO

## 2022-01-22 ENCOUNTER — Ambulatory Visit
Admission: EM | Admit: 2022-01-22 | Discharge: 2022-01-22 | Disposition: A | Discharge status: Home / Self Care | Payer: Medicare HMO | Attending: Urgent Care | Admitting: Urgent Care

## 2022-01-22 DIAGNOSIS — R0602 Shortness of breath: Secondary | ICD-10-CM | POA: Diagnosis not present

## 2022-01-22 DIAGNOSIS — R079 Chest pain, unspecified: Secondary | ICD-10-CM

## 2022-01-22 DIAGNOSIS — J441 Chronic obstructive pulmonary disease with (acute) exacerbation: Secondary | ICD-10-CM

## 2022-01-22 MED ORDER — IPRATROPIUM-ALBUTEROL 0.5-2.5 (3) MG/3ML IN SOLN
3.0000 mL | Freq: Once | RESPIRATORY_TRACT | Status: AC
  Administered 2022-01-22: 3 mL via RESPIRATORY_TRACT

## 2022-01-22 MED ORDER — COMBIVENT RESPIMAT 20-100 MCG/ACT IN AERS: 1.0000 | INHALATION_SPRAY | Freq: Four times a day (QID) | RESPIRATORY_TRACT | 2 refills | Status: DC | PRN

## 2022-01-22 MED ORDER — FLUTICASONE FUROATE-VILANTEROL 100-25 MCG/ACT IN AEPB: 1.0000 | INHALATION_SPRAY | Freq: Every day | RESPIRATORY_TRACT | 0 refills | Status: DC

## 2022-01-22 NOTE — Discharge Instructions (Addendum)
Please start using combivent 1 puff every 6 hours as needed for shortness of breath. ?You were given this in our office at 8pm, so you cannot take another treatment until 2am on 01/23/22. ?Start the [facility] in the morning. ?Rinse your mouth out after each use to prevent thrush ?Please follow up with your PCP in 1 week, you need to get qualified for a home nebulizer machine. ?Your chest xray was negative for pneumonia. ?

## 2022-01-22 NOTE — ED Triage Notes (Signed)
Pt c/o dyspnea, cough ~ 1 month, intermittent CP. Breathing worse at night, needs to sit up.  ?

## 2022-01-22 NOTE — ED Provider Notes (Signed)
?[facility] ? ? ? ?CSN: [id number] ?Arrival date & time: 01/22/22  1815 ? ? ?  ? ?History   ?Chief Complaint ?Chief Complaint  ?Patient presents with  ? Cough  ? Shortness of Breath  ? ? ?HPI ?Martin [name] is a 61 y.o. male.  ? ?61yo male with significant past medical hx presents today primarily complaining of cough and SOB. Has longstanding hx of COPD, states he is "frustrated because all people ever give me is oral pills that don't help me breathe." States he has PRN albuterol for use, but no other pulmonary treatments. States he chronically feels short of breath, but reports increased cough recently. Denies sputum production. Pt states the feeling of the inability to breath causes him significant anxiety. Reports that today he had 3 twinges of pain in his chest, but states it was bilateral and primarily in the area of his diaphragm. He denies any diaphoresis, crushing chest pains. States he has no pain at all in our office at present time. Has smoked 3 PPD since age 63 until recently in which he cut back to 1 PPD, but states he feels it will be hard to cut back any more. Denies fever. States he feels tired, but denies any lethargy, weakness or myalgias.  ? ? ?Cough ?Associated symptoms: shortness of breath   ?Shortness of Breath ?Associated symptoms: cough   ? ?Past Medical History:  ?Diagnosis Date  ? Allergy 10/2011  ? Anal cancer ([place]) 10/14/2011  ? 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 Anxiety   ? Arthritis   ? DDD lumbar, arthritis knees  ? COPD (chronic obstructive pulmonary disease) ([place])   ? DJD (degenerative joint disease) of lumbar spine   ? GERD (gastroesophageal reflux disease)   ? Hemorrhoid   ? internal  ? History of bowel resection 04/19/2017  ? Hypertension   ? Pt states he's never been told or treated for HTN  ? Illicit drug use   ? + UDS cocaine, THC, opiates 10/11/21  ? Inguinal hernia   ? MSSA bacteremia 10/11/2021  ? s/p IV cefazolin, po Linezolid; 10/16/21 TEE w/o  vegetations.  ? Peripheral vascular disease ([place])   ? Pneumonia   ? as child, cough at present with no fever  ? Pneumonia   ? as child, cough at present with no fever   ? Radiation 11/19/11-01/08/12  ? 5040 cGy 28 fx Pelvis and inguinal area  ? Stroke [facility])   ? no weaknes or paralysis  ? Substance abuse ([place])   ? ? ?Patient Active Problem List  ? Diagnosis Date Noted  ? Sepsis 10/12/2021  ? Peripheral vascular disease   ? AKI   ? Tobacco use   ? Emphysema   ? BPH    ? Periodontal disease   ? IVDU, cocaine use   ? Demand ischemia   ? Thrombocytopenia   ? Normocytic anemia   ? MSSA bacteremia 10/11/2021  ? Callus 06/12/2021  ? Hav (hallux abducto valgus), unspecified laterality 06/12/2021  ? Hammer toes, bilateral 06/12/2021  ? Atherosclerosis of native arteries of extremity with intermittent claudication ([place]) 06/12/2021  ? Hyperlipidemia 06/07/2021  ? 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 Elevated blood-pressure reading, without diagnosis of hypertension 09/26/2020  ? Body mass index (BMI) 29.0-29.9, adult 08/29/2020  ? Partial small bowel obstruction ([place]) 03/10/2020  ? Small bowel obstruction due to adhesions ([place]) 03/08/2020  ? Enteritis 10/15/2019  ? Abnormal liver function 10/15/2019  ?  Leukocytosis 10/15/2019  ? Nausea & vomiting 10/15/2019  ? GAD (generalized anxiety disorder) 05/18/2017  ? 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 Constipation 04/11/2017  ? Bee sting 04/05/2017  ? Diverticulosis 11/29/2014  ? Healthcare maintenance 11/06/2014  ? Cough 10/05/2014  ? Insomnia 09/13/2014  ? Erectile dysfunction 11/30/2013  ? Chronic pain syndrome 11/30/2013  ? Depression 11/30/2013  ? GERD (gastroesophageal reflux disease)   ? Hypertension   ? Anxiety   ? Arthritis   ? Osteoarthritis of lumbar spine   ? Radiation   ? Night sweats 08/12/2012  ? Tobacco abuse counseling 05/26/2012  ? 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 Vitamin B12 deficiency 03/19/2012  ? Folate deficiency 03/19/2012  ? Inguinal hernia  11/24/2011  ? Cancer ([place]) 10/14/2011  ? Internal hemorrhoid 09/11/2011  ? Anal cancer 10/09/2010  ? ? ?Past Surgical History:  ?Procedure Laterality Date  ? APPENDECTOMY    ? age 31 or 40  ? EXAMINATION UNDER ANESTHESIA  10/14/2011  ? Procedure: EXAM UNDER ANESTHESIA;  Surgeon: Earnstine Regal, MD;  Location: [facility];  Service: General;  Laterality: N/A;  exam under anethesia, Excision of mass anal canal, 1.5cm  ? INGUINAL HERNIA REPAIR  05/11/2012  ? Procedure: HERNIA REPAIR INGUINAL ADULT;  Surgeon: Earnstine Regal, MD;  Location: [place];  Service: General;  Laterality: Left;  left inguinal hernia repair with mesh  ? LOWER EXTREMITY ANGIOGRAPHY Left 07/02/2021  ? Procedure: LOWER EXTREMITY ANGIOGRAPHY;  Surgeon: Katha Cabal, MD;  Location: [place] [facility];  Service: Cardiovascular;  Laterality: Left;  ? LOWER EXTREMITY ANGIOGRAPHY Right 07/16/2021  ? Procedure: LOWER EXTREMITY ANGIOGRAPHY;  Surgeon: Katha Cabal, MD;  Location: [place] [facility];  Service: Cardiovascular;  Laterality: Right;  ? LOWER EXTREMITY ANGIOGRAPHY Left 08/27/2021  ? Procedure: LOWER EXTREMITY ANGIOGRAPHY;  Surgeon: Katha Cabal, MD;  Location: [place] [facility];  Service: Cardiovascular;  Laterality: Left;  ? LOWER EXTREMITY ANGIOGRAPHY Left 01/14/2022  ? Procedure: Lower Extremity Angiography;  Surgeon: Katha Cabal, MD;  Location: [place] [facility];  Service: Cardiovascular;  Laterality: Left;  ? SMALL INTESTINE SURGERY  04/19/2017  ? exploratory lap, partial small bowel resection for perforated ulcer/abcess, radiation enteritis  ? surgical pathology   10/14/2011  ? squamous cell ca of anus  ? TEE WITHOUT CARDIOVERSION N/A 10/16/2021  ? Procedure: TRANSESOPHAGEAL ECHOCARDIOGRAM (TEE);  Surgeon: [name], MD;  Location: [place];  Service: Cardiovascular;  Laterality: N/A;  ? TOOTH EXTRACTION N/A 12/04/2021  ? Procedure: DENTAL RESTORATION/EXTRACTIONS;  Surgeon: Diona Browner,  DMD;  Location: [place];  Service: Oral Surgery;  Laterality: N/A;  ? transanal excision  10/14/2011  ? Dr.Todd Gerkin  ? ? ? ? ? ?Home Medications   ? ?Prior to Admission medications   ?Medication Sig Start Date End Date Taking? Authorizing Provider  ?fluticasone furoate-vilanterol (BREO ELLIPTA) 100-25 MCG/ACT AEPB Inhale 1 puff into the lungs daily. 01/22/22  Yes Annise Boran L, PA  ?Ipratropium-Albuterol (COMBIVENT RESPIMAT) 20-100 MCG/ACT AERS respimat Inhale 1 puff into the lungs every 6 (six) hours as needed for wheezing or shortness of breath. 01/22/22  Yes Shiree Altemus L, PA  ?acetaminophen (TYLENOL) 500 MG tablet Take 1,000 mg by mouth every 6 (six) hours as needed for mild pain.    [provider]  ?apixaban (ELIQUIS) 2.5 MG TABS tablet Take 1 tablet (2.5 mg total) by mouth 2 (two) times daily. 01/14/22   Schnier, Dolores Lory, MD  ?clopidogrel (PLAVIX) 75  MG tablet Take 1 tablet (75 mg total) by mouth daily. 07/03/21   Schnier, Dolores Lory, MD  ?econazole nitrate 1 % cream Apply topically daily. 01/17/22   Nyoka Lint, PA-C  ?EPINEPHrine (EPIPEN 2-PAK) 0.3 mg/0.3 mL IJ SOAJ injection Inject 0.3 mLs (0.3 mg total) into the muscle as needed for anaphylaxis. ?Patient not taking: Reported on 01/08/2022 02/23/20   Margette Fast, MD  ?folic acid (FOLVITE) 1 MG tablet Take 1 tablet (1 mg total) by mouth daily. 10/18/21   Elgergawy, Silver Huguenin, MD  ?methadone (DOLOPHINE) 10 MG/ML solution Take 70 mg by mouth daily. Confirmed with [facility] 10/12/21    [provider]  ?tamsulosin (FLOMAX) 0.4 MG CAPS capsule Take 1 capsule (0.4 mg total) by mouth at bedtime. 09/19/21   Camillia Herter, NP  ?sucralfate (CARAFATE) 1 GM/10ML suspension Take 10 mLs (1 g total) by mouth 4 (four) times daily -  with meals and at bedtime. ?Patient not taking: Reported on 11/28/2019 10/21/19 06/13/20  Randal Buba, April, MD  ? ? ?Family History ?Family History  ?Problem Relation Age of Onset  ? Colon cancer Father   ? Asthma  Father   ? Cancer Father   ?     prostate  ? Hypertension Father   ? Asthma Mother   ? Hypertension Mother   ? Stomach cancer Neg Hx   ? Esophageal cancer Neg Hx   ? ? ?Social History ?Social History  ? ?Tobacco [place]

## 2022-01-25 ENCOUNTER — Ambulatory Visit
Admission: EM | Admit: 2022-01-25 | Discharge: 2022-01-25 | Disposition: A | Discharge status: Home / Self Care | Payer: Medicare HMO

## 2022-01-25 ENCOUNTER — Other Ambulatory Visit: Payer: Self-pay

## 2022-01-25 ENCOUNTER — Encounter (HOSPITAL_BASED_OUTPATIENT_CLINIC_OR_DEPARTMENT_OTHER): Payer: Self-pay

## 2022-01-25 ENCOUNTER — Emergency Department (HOSPITAL_BASED_OUTPATIENT_CLINIC_OR_DEPARTMENT_OTHER)
Admission: EM | Admit: 2022-01-25 | Discharge: 2022-01-25 | Discharge status: Against Medical Advice | Payer: Medicare HMO | Attending: Internal Medicine | Admitting: Internal Medicine

## 2022-01-25 DIAGNOSIS — Z7901 Long term (current) use of anticoagulants: Secondary | ICD-10-CM | POA: Insufficient documentation

## 2022-01-25 DIAGNOSIS — Z5321 Procedure and treatment not carried out due to patient leaving prior to being seen by health care provider: Secondary | ICD-10-CM | POA: Insufficient documentation

## 2022-01-25 DIAGNOSIS — R040 Epistaxis: Secondary | ICD-10-CM | POA: Diagnosis not present

## 2022-01-25 NOTE — ED Triage Notes (Signed)
Pt c/o nosebleed onset this morning states he's on 2 blood thinners and cannot get to stop. Currently has tissue lodged into nostril.

## 2022-01-25 NOTE — Discharge Instructions (Signed)
Go to the ER as soon as you leave urgent care for further evaluation and management.

## 2022-01-25 NOTE — ED Provider Notes (Signed)
[name]    CSN: [id number] Arrival date & time: 01/25/22  1457      History   Chief Complaint Chief Complaint  Patient presents with   Epistaxis    HPI Martin [name] is a 61 y.o. male.   Patient presents with nosebleed that has been present since early this morning.  Patient reports that he takes 2 blood thinning medications and he was concerned given persistent nosebleed.  He has been using a Kleenex to stop up the nostril which has not provided any relief of nosebleed.  He also reports that he has been feeling a little bit weak since it started.  Denies any trauma or foreign body to the nose.  He reports that it started after he blew his nose.   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   Peripheral vascular disease ([place])    Pneumonia    as child, cough at present with no fever   Pneumonia    as child, cough at present with no fever    Radiation 11/19/11-01/08/12   5040 cGy 28 fx Pelvis and inguinal area   Stroke ([place])    no weaknes or paralysis   Substance abuse ([place])     Patient Active Problem List   Diagnosis Date Noted   Sepsis 10/12/2021   Peripheral vascular disease    AKI    Tobacco use    Emphysema    BPH     Periodontal disease    IVDU, cocaine use    Demand ischemia    Thrombocytopenia    Normocytic anemia    MSSA bacteremia  10/11/2021   Callus 06/12/2021   Hav (hallux abducto valgus), unspecified laterality 06/12/2021   Hammer toes, bilateral 06/12/2021   Atherosclerosis of native arteries of extremity with intermittent claudication ([place]) 06/12/2021   Hyperlipidemia 06/07/2021   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   Elevated blood-pressure reading, without diagnosis of hypertension 09/26/2020   Body mass index (BMI) 29.0-29.9, adult 08/29/2020   Partial small bowel obstruction ([place]) 03/10/2020   Small bowel obstruction due to adhesions ([place]) 03/08/2020   Enteritis 10/15/2019   Abnormal liver function 10/15/2019   Leukocytosis 10/15/2019   Nausea & vomiting 10/15/2019   GAD (generalized anxiety disorder) 05/18/2017   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   Constipation 04/11/2017   Bee sting 04/05/2017   Diverticulosis 11/29/2014   Healthcare maintenance 11/06/2014   Cough 10/05/2014   Insomnia 09/13/2014   Erectile dysfunction 11/30/2013   Chronic pain syndrome 11/30/2013   Depression 11/30/2013   GERD (gastroesophageal reflux disease)    Hypertension    Anxiety    Arthritis    Osteoarthritis of lumbar spine    Radiation    Night sweats 08/12/2012   Tobacco  abuse counseling 05/26/2012   Lymphocytic colitis 05/03/2012   Vitamin B12 deficiency 03/19/2012   Folate deficiency 03/19/2012   Inguinal hernia 11/24/2011   Cancer ([place]) 10/14/2011   Internal hemorrhoid 09/11/2011   Anal cancer 10/09/2010    Past Surgical History:  Procedure Laterality Date   APPENDECTOMY     age 18 or 71   EXAMINATION UNDER ANESTHESIA  10/14/2011   Procedure: EXAM UNDER ANESTHESIA;  Surgeon: Earnstine Regal, MD;  [facility];  Service: General;  Laterality: N/A;  exam under anethesia, Excision of mass anal canal, 1.5cm   INGUINAL HERNIA REPAIR  05/11/2012   Procedure: HERNIA REPAIR INGUINAL ADULT;  Surgeon: Earnstine Regal, MD;  Location: [place];  Service: General;  Laterality: Left;   left inguinal hernia repair with mesh   LOWER EXTREMITY ANGIOGRAPHY Left 07/02/2021   Procedure: LOWER EXTREMITY ANGIOGRAPHY;  Surgeon: Katha Cabal, MD;  Location: [place] [facility];  Service: Cardiovascular;  Laterality: Left;   LOWER EXTREMITY ANGIOGRAPHY Right 07/16/2021   Procedure: LOWER EXTREMITY ANGIOGRAPHY;  Surgeon: Katha Cabal, MD;  Location: [place] [facility];  Service: Cardiovascular;  Laterality: Right;   LOWER EXTREMITY ANGIOGRAPHY Left 08/27/2021   Procedure: LOWER EXTREMITY ANGIOGRAPHY;  Surgeon: Katha Cabal, MD;  Location: [place] [facility];  Service: Cardiovascular;  Laterality: Left;   LOWER EXTREMITY ANGIOGRAPHY Left 01/14/2022   Procedure: Lower Extremity Angiography;  Surgeon: Katha Cabal, MD;  Location: [place] [facility];  Service: Cardiovascular;  Laterality: Left;   SMALL INTESTINE SURGERY  04/19/2017   exploratory lap, partial small bowel resection for perforated ulcer/abcess, radiation enteritis   surgical pathology   10/14/2011   squamous cell ca of anus   TEE WITHOUT CARDIOVERSION N/A 10/16/2021   Procedure: TRANSESOPHAGEAL ECHOCARDIOGRAM (TEE);  Surgeon: [name], MD;  Location: [facility];  Service: Cardiovascular;  Laterality: N/A;   TOOTH EXTRACTION N/A 12/04/2021   Procedure: DENTAL RESTORATION/EXTRACTIONS;  Surgeon: Diona Browner, DMD;  Location: [place];  Service: Oral Surgery;  Laterality: N/A;   transanal excision  10/14/2011   Dr.Todd Gerkin       Home Medications    Prior to Admission medications   Medication Sig Start Date End Date Taking? Authorizing Provider  acetaminophen (TYLENOL) 500 MG tablet Take 1,000 mg by mouth every 6 (six) hours as needed for mild pain.    [provider]  apixaban (ELIQUIS) 2.5 MG TABS tablet Take 1 tablet (2.5 mg total) by mouth 2 (two) times daily. 01/14/22   Schnier, Dolores Lory, MD  clopidogrel (PLAVIX) 75 MG tablet Take 1 tablet (75 mg total) by mouth daily.  07/03/21   Schnier, Dolores Lory, MD  econazole nitrate 1 % cream Apply topically daily. 01/17/22   Nyoka Lint, PA-C  EPINEPHrine (EPIPEN 2-PAK) 0.3 mg/0.3 mL IJ SOAJ injection Inject 0.3 mLs (0.3 mg total) into the muscle as needed for anaphylaxis. Patient not taking: Reported on 01/08/2022 02/23/20   Long, Wonda Olds, MD  fluticasone furoate-vilanterol (BREO ELLIPTA) 100-25 MCG/ACT AEPB Inhale 1 puff into the lungs daily. 01/22/22   Crain, Whitney L, PA  folic acid (FOLVITE) 1 MG tablet Take 1 tablet (1 mg total) by mouth daily. 10/18/21   Elgergawy, Silver Huguenin, MD  Ipratropium-Albuterol (COMBIVENT RESPIMAT) 20-100 MCG/ACT AERS respimat Inhale 1 puff into the lungs every 6 (six) hours as needed for wheezing or shortness of breath. 01/22/22   Crain, Whitney L, PA  methadone (DOLOPHINE) 10 MG/ML solution Take  70 mg by mouth daily. Confirmed with [facility] 10/12/21    [provider]  tamsulosin (FLOMAX) 0.4 MG CAPS capsule Take 1 capsule (0.4 mg total) by mouth at bedtime. 09/19/21   Camillia Herter, NP  sucralfate (CARAFATE) 1 GM/10ML suspension Take 10 mLs (1 g total) by mouth 4 (four) times daily -  with meals and at bedtime. Patient not taking: Reported on 11/28/2019 10/21/19 06/13/20  Randal Buba, April, MD    Family History Family History  Problem Relation Age of Onset   Colon cancer Father    Asthma Father    Cancer Father        prostate   Hypertension Father    Asthma Mother    Hypertension Mother    Stomach cancer Neg Hx    Esophageal cancer Neg Hx     Social History Social History   Tobacco Use   Smoking status: Every Day    Packs/day: 1.00    Years: 47.00    Pack years: 47.00    Types: Cigarettes   Smokeless tobacco: Never   Tobacco comments:    Cutting back>> Quit Smart card supplied  Scientific laboratory technician Use: Never used  Substance Use Topics   Alcohol use: Not Currently    Comment: stopped years ago   Drug use: Not Currently    Types: Marijuana, Cocaine     Comment: occasional marijuana, last Cocaine use 2-3 weeks (as of 12/03/21)     Allergies   Aspirin, Yellow jacket venom [bee venom], Codeine, Ibuprofen, and Morphine and related   Review of Systems Review of Systems Per HPI  Physical Exam Triage Vital Signs ED Triage Vitals  Enc Vitals Group     BP 01/25/22 1529 (!) 124/57     Pulse Rate 01/25/22 1529 73     Resp 01/25/22 1529 18     Temp 01/25/22 1529 98 F (36.7 C)     Temp Source 01/25/22 1529 Oral     SpO2 01/25/22 1529 95 %     Weight --      Height --      Head Circumference --      Peak Flow --      Pain Score 01/25/22 1531 0     Pain Loc --      Pain Edu? --      Excl. in [place]? --    No data found.  Updated Vital Signs BP (!) 125/56 (BP Location: Left Arm)   Pulse 73   Temp 98 F (36.7 C) (Oral)   Resp 18   SpO2 95%   Visual Acuity Right Eye Distance:   Left Eye Distance:   Bilateral Distance:    Right Eye Near:   Left Eye Near:    Bilateral Near:     Physical Exam Constitutional:      General: He is not in acute distress.    Appearance: Normal appearance. He is not toxic-appearing or diaphoretic.  HENT:     Head: Normocephalic and atraumatic.     Nose: No nasal deformity, septal deviation, signs of injury, laceration, nasal tenderness or mucosal edema.     Right Nostril: Epistaxis present.     Comments: Epistaxis noted to right nare.  No obvious injury or abnormality noted. Eyes:     Extraocular Movements: Extraocular movements intact.     Conjunctiva/sclera: Conjunctivae normal.  Pulmonary:     Effort: Pulmonary effort is normal.  Neurological:     General:  No focal deficit present.     Mental Status: He is alert and oriented to person, place, and time. Mental status is at baseline.  Psychiatric:        Mood and Affect: Mood normal.        Behavior: Behavior normal.        Thought Content: Thought content normal.        Judgment: Judgment normal.     UC Treatments / Results   Labs (all labs ordered are listed, but only abnormal results are displayed) Labs Reviewed - No data to display  EKG   Radiology No results found.  Procedures Procedures (including critical care time)  Medications Ordered in UC Medications - No data to display  Initial Impression / Assessment and Plan / UC Course  I have reviewed the triage vital signs and the nursing notes.  Pertinent labs & imaging results that were available during my care of the patient were reviewed by me and considered in my medical decision making (see chart for details).     Right nare epistaxis upon arrival.  Patient reported tha he had nosebleed since early this morning which is approximately 6 to 8 hours in total upon arrival to urgent care.  Attempted to stop nosebleed with Afrin nose spray as well as applying pressure to nose.  This did not prove successful after multiple attempts, so patient was advised that he will need to go to the hospital for further evaluation and management given limited resources here in the urgent care and no access to [employer].  It is very worrisome due to duration of nosebleed and patient taking blood thinning medications.  Patient was agreeable with plan and left via self transport to go to the hospital. Final Clinical Impressions(s) / UC Diagnoses   Final diagnoses:  Epistaxis     Discharge Instructions      Go to the ER as soon as you leave urgent care for further evaluation and management.    ED Prescriptions   None    PDMP not reviewed this encounter.   Teodora Medici, [place] 01/26/22 [phone]

## 2022-01-25 NOTE — ED Notes (Signed)
Registration informed this RN the Patient LWBS from Waiting Area.

## 2022-01-25 NOTE — ED Triage Notes (Signed)
Patient here POV from UC.  Endorses Bleeding from Right Nostril since 0300 today that has continued since.  Seen at [facility] today and sent for Evaluation. Bleeding Minimal at the Time of Triage. Patient does take Anticoagulants.   NAD Noted during Triage. A&Ox4. GCS 15. Ambulatory.

## 2022-01-27 DIAGNOSIS — F1120 Opioid dependence, uncomplicated: Secondary | ICD-10-CM | POA: Diagnosis not present

## 2022-01-28 ENCOUNTER — Other Ambulatory Visit (INDEPENDENT_AMBULATORY_CARE_PROVIDER_SITE_OTHER): Payer: Self-pay | Admitting: Vascular Surgery

## 2022-01-28 ENCOUNTER — Telehealth (INDEPENDENT_AMBULATORY_CARE_PROVIDER_SITE_OTHER): Payer: Self-pay

## 2022-01-28 NOTE — Telephone Encounter (Signed)
He should continue to take both blood thinners at least for the next few months.  This is to try to maintain his stent patency since he has clotted off his stents.

## 2022-01-29 NOTE — Telephone Encounter (Signed)
Patient was made aware with medical advice and verbalized understanding

## 2022-02-03 DIAGNOSIS — F1120 Opioid dependence, uncomplicated: Secondary | ICD-10-CM | POA: Diagnosis not present

## 2022-02-05 ENCOUNTER — Other Ambulatory Visit: Payer: Self-pay | Admitting: Family

## 2022-02-05 DIAGNOSIS — C801 Malignant (primary) neoplasm, unspecified: Secondary | ICD-10-CM

## 2022-02-07 ENCOUNTER — Other Ambulatory Visit (INDEPENDENT_AMBULATORY_CARE_PROVIDER_SITE_OTHER): Payer: Self-pay | Admitting: Vascular Surgery

## 2022-02-07 DIAGNOSIS — I70222 Atherosclerosis of native arteries of extremities with rest pain, left leg: Secondary | ICD-10-CM

## 2022-02-07 DIAGNOSIS — Z95820 Peripheral vascular angioplasty status with implants and grafts: Secondary | ICD-10-CM

## 2022-02-10 ENCOUNTER — Other Ambulatory Visit: Payer: Self-pay | Admitting: Family

## 2022-02-10 ENCOUNTER — Ambulatory Visit (INDEPENDENT_AMBULATORY_CARE_PROVIDER_SITE_OTHER): Payer: Medicare HMO

## 2022-02-10 ENCOUNTER — Encounter (INDEPENDENT_AMBULATORY_CARE_PROVIDER_SITE_OTHER): Payer: Self-pay | Admitting: Vascular Surgery

## 2022-02-10 ENCOUNTER — Encounter (INDEPENDENT_AMBULATORY_CARE_PROVIDER_SITE_OTHER): Payer: Medicare HMO

## 2022-02-10 ENCOUNTER — Ambulatory Visit (INDEPENDENT_AMBULATORY_CARE_PROVIDER_SITE_OTHER): Payer: Medicare HMO | Admitting: Vascular Surgery

## 2022-02-10 VITALS — BP 136/62 | HR 65 | Ht 71.0 in | Wt 142.6 lb

## 2022-02-10 DIAGNOSIS — J439 Emphysema, unspecified: Secondary | ICD-10-CM

## 2022-02-10 DIAGNOSIS — K219 Gastro-esophageal reflux disease without esophagitis: Secondary | ICD-10-CM | POA: Diagnosis not present

## 2022-02-10 DIAGNOSIS — I70222 Atherosclerosis of native arteries of extremities with rest pain, left leg: Secondary | ICD-10-CM

## 2022-02-10 DIAGNOSIS — Z95820 Peripheral vascular angioplasty status with implants and grafts: Secondary | ICD-10-CM | POA: Diagnosis not present

## 2022-02-10 DIAGNOSIS — F1120 Opioid dependence, uncomplicated: Secondary | ICD-10-CM | POA: Diagnosis not present

## 2022-02-10 DIAGNOSIS — C801 Malignant (primary) neoplasm, unspecified: Secondary | ICD-10-CM

## 2022-02-10 DIAGNOSIS — I10 Essential (primary) hypertension: Secondary | ICD-10-CM | POA: Diagnosis not present

## 2022-02-10 NOTE — Telephone Encounter (Signed)
Pt called and is upset that he has to wait for refills, says he has been out for four days. Wants to know if this Rx can be refilled today

## 2022-02-11 ENCOUNTER — Other Ambulatory Visit: Payer: Self-pay | Admitting: Family

## 2022-02-11 DIAGNOSIS — N400 Enlarged prostate without lower urinary tract symptoms: Secondary | ICD-10-CM

## 2022-02-11 NOTE — Telephone Encounter (Signed)
Refilled per patient request. Schedule appointment for additional refills. Last appointment October 2022.

## 2022-02-12 ENCOUNTER — Ambulatory Visit
Admission: EM | Admit: 2022-02-12 | Discharge: 2022-02-12 | Disposition: A | Discharge status: Home / Self Care | Payer: Medicare HMO | Attending: Internal Medicine | Admitting: Internal Medicine

## 2022-02-12 ENCOUNTER — Ambulatory Visit (INDEPENDENT_AMBULATORY_CARE_PROVIDER_SITE_OTHER): Payer: Medicare HMO

## 2022-02-12 DIAGNOSIS — M25512 Pain in left shoulder: Secondary | ICD-10-CM

## 2022-02-12 NOTE — ED Provider Notes (Signed)
[name]    CSN: [id number] Arrival date & time: 02/12/22  1026      History   Chief Complaint Chief Complaint  Patient presents with   left shoulder pain     HPI Martin [name] is a 61 y.o. male.   Patient presents with left shoulder pain that started a few days prior.  Denies any apparent recent injury.  Patient does report that he dropped a box of tile on that shoulder when he was younger but was not evaluated by a doctor at that time.  Patient denies any numbness or tingling.  Pain is mainly exacerbated with lifting the arm up.  Pain is present in the lateral shoulder.  Patient has taken Tylenol for pain with minimal improvement.    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   Peripheral vascular disease ([place])    Pneumonia    as child, cough at present with no fever   Pneumonia    as child, cough at present with no fever    Radiation 11/19/11-01/08/12   5040 cGy 28 fx Pelvis and inguinal area   Stroke ([place])    no weaknes or paralysis   Substance abuse ([place])     Patient Active Problem List   Diagnosis Date Noted   Sepsis 10/12/2021   Peripheral vascular disease    AKI    Tobacco use    Emphysema    BPH     Periodontal disease    IVDU, cocaine use    Demand ischemia    Thrombocytopenia    Normocytic anemia    MSSA bacteremia 10/11/2021   Callus  06/12/2021   Hav (hallux abducto valgus), unspecified laterality 06/12/2021   Hammer toes, bilateral 06/12/2021   Atherosclerosis of native arteries of extremity with intermittent claudication ([place]) 06/12/2021   Hyperlipidemia 06/07/2021   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   Elevated blood-pressure reading, without diagnosis of hypertension 09/26/2020   Body mass index (BMI) 29.0-29.9, adult 08/29/2020   Partial small bowel obstruction ([place]) 03/10/2020   Small bowel obstruction due to adhesions ([place]) 03/08/2020   Enteritis 10/15/2019   Abnormal liver function 10/15/2019   Leukocytosis 10/15/2019   Nausea & vomiting 10/15/2019   GAD (generalized anxiety disorder) 05/18/2017   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   Constipation 04/11/2017   Bee sting 04/05/2017   Diverticulosis 11/29/2014   Healthcare maintenance 11/06/2014   Cough 10/05/2014   Insomnia 09/13/2014   Erectile dysfunction 11/30/2013   Chronic pain syndrome 11/30/2013   Depression 11/30/2013   GERD (gastroesophageal reflux disease)    Hypertension    Anxiety    Arthritis    Osteoarthritis of lumbar spine    Radiation    Night sweats 08/12/2012   Tobacco abuse counseling 05/26/2012  Lymphocytic colitis 05/03/2012   Vitamin B12 deficiency 03/19/2012   Folate deficiency 03/19/2012   Inguinal hernia 11/24/2011   Cancer ([place]) 10/14/2011   Internal hemorrhoid 09/11/2011   Anal cancer 10/09/2010    Past Surgical History:  Procedure Laterality Date   APPENDECTOMY     age 19 or 63   EXAMINATION UNDER ANESTHESIA  10/14/2011   Procedure: EXAM UNDER ANESTHESIA;  Surgeon: Earnstine Regal, MD;  Location: [facility];  Service: General;  Laterality: N/A;  exam under anethesia, Excision of mass anal canal, 1.5cm   INGUINAL HERNIA REPAIR  05/11/2012   Procedure: HERNIA REPAIR INGUINAL ADULT;  Surgeon: Earnstine Regal, MD;  Location: [facility];  Service: General;  Laterality: Left;  left inguinal hernia  repair with mesh   LOWER EXTREMITY ANGIOGRAPHY Left 07/02/2021   Procedure: LOWER EXTREMITY ANGIOGRAPHY;  Surgeon: Katha Cabal, MD;  Location: [place] [facility];  Service: Cardiovascular;  Laterality: Left;   LOWER EXTREMITY ANGIOGRAPHY Right 07/16/2021   Procedure: LOWER EXTREMITY ANGIOGRAPHY;  Surgeon: Katha Cabal, MD;  Location: [place] [facility];  Service: Cardiovascular;  Laterality: Right;   LOWER EXTREMITY ANGIOGRAPHY Left 08/27/2021   Procedure: LOWER EXTREMITY ANGIOGRAPHY;  Surgeon: Katha Cabal, MD;  Location: [place] [facility];  Service: Cardiovascular;  Laterality: Left;   LOWER EXTREMITY ANGIOGRAPHY Left 01/14/2022   Procedure: Lower Extremity Angiography;  Surgeon: Katha Cabal, MD;  Location: [place] [facility];  Service: Cardiovascular;  Laterality: Left;   SMALL INTESTINE SURGERY  04/19/2017   exploratory lap, partial small bowel resection for perforated ulcer/abcess, radiation enteritis   surgical pathology   10/14/2011   squamous cell ca of anus   TEE WITHOUT CARDIOVERSION N/A 10/16/2021   Procedure: TRANSESOPHAGEAL ECHOCARDIOGRAM (TEE);  Surgeon: [name], MD;  Location: [facility];  Service: Cardiovascular;  Laterality: N/A;   TOOTH EXTRACTION N/A 12/04/2021   Procedure: DENTAL RESTORATION/EXTRACTIONS;  Surgeon: Diona Browner, DMD;  Location: [place];  Service: Oral Surgery;  Laterality: N/A;   transanal excision  10/14/2011   Dr.Todd Gerkin       Home Medications    Prior to Admission medications   Medication Sig Start Date End Date Taking? Authorizing Provider  acetaminophen (TYLENOL) 500 MG tablet Take 1,000 mg by mouth every 6 (six) hours as needed for mild pain.    [provider]  apixaban (ELIQUIS) 2.5 MG TABS tablet Take 1 tablet (2.5 mg total) by mouth 2 (two) times daily. 01/14/22   Schnier, Dolores Lory, MD  clopidogrel (PLAVIX) 75 MG tablet Take 1 tablet by mouth once daily 01/28/22   Schnier, Dolores Lory, MD   econazole nitrate 1 % cream Apply topically daily. 01/17/22   Nyoka Lint, PA-C  EPINEPHrine (EPIPEN 2-PAK) 0.3 mg/0.3 mL IJ SOAJ injection Inject 0.3 mLs (0.3 mg total) into the muscle as needed for anaphylaxis. 02/23/20   Long, Wonda Olds, MD  fluticasone furoate-vilanterol (BREO ELLIPTA) 100-25 MCG/ACT AEPB Inhale 1 puff into the lungs daily. 01/22/22   Crain, Whitney L, PA  folic acid (FOLVITE) 1 MG tablet Take 1 tablet (1 mg total) by mouth daily. 10/18/21   Elgergawy, Silver Huguenin, MD  Ipratropium-Albuterol (COMBIVENT RESPIMAT) 20-100 MCG/ACT AERS respimat Inhale 1 puff into the lungs every 6 (six) hours as needed for wheezing or shortness of breath. 01/22/22   Crain, Whitney L, PA  methadone (DOLOPHINE) 10 MG/ML solution Take 70 mg by mouth daily. Confirmed with [facility] 10/12/21  [provider]  tamsulosin (FLOMAX) 0.4 MG CAPS capsule Take 1 capsule by mouth at bedtime 02/11/22   Camillia Herter, NP  sucralfate (CARAFATE) 1 GM/10ML suspension Take 10 mLs (1 g total) by mouth 4 (four) times daily -  with meals and at bedtime. Patient not taking: Reported on 11/28/2019 10/21/19 06/13/20  Randal Buba, April, MD    Family History Family History  Problem Relation Age of Onset   Colon cancer Father    Asthma Father    Cancer Father        prostate   Hypertension Father    Asthma Mother    Hypertension Mother    Stomach cancer Neg Hx    Esophageal cancer Neg Hx     Social History Social History   Tobacco Use   Smoking status: Every Day    Packs/day: 1.00    Years: 47.00    Pack years: 47.00    Types: Cigarettes   Smokeless tobacco: Never   Tobacco comments:    Cutting back>> Quit Smart card supplied  Scientific laboratory technician Use: Never used  Substance Use Topics   Alcohol use: Not Currently    Comment: stopped years ago   Drug use: Not Currently    Types: Marijuana, Cocaine    Comment: occasional marijuana, last Cocaine use 2-3 weeks (as of 12/03/21)     Allergies    Aspirin, Yellow jacket venom [bee venom], Codeine, Ibuprofen, and Morphine and related   Review of Systems Review of Systems Per HPI  Physical Exam Triage Vital Signs ED Triage Vitals [02/12/22 1141]  Enc Vitals Group     BP (!) 127/56     Pulse Rate 60     Resp 18     Temp 98 F (36.7 C)     Temp Source Oral     SpO2 98 %     Weight      Height      Head Circumference      Peak Flow      Pain Score 0     Pain Loc      Pain Edu?      Excl. in [place]?    No data found.  Updated Vital Signs BP (!) 127/56 (BP Location: Left Arm)   Pulse 60   Temp 98 F (36.7 C) (Oral)   Resp 18   SpO2 98%   Visual Acuity Right Eye Distance:   Left Eye Distance:   Bilateral Distance:    Right Eye Near:   Left Eye Near:    Bilateral Near:     Physical Exam Constitutional:      General: He is not in acute distress.    Appearance: Normal appearance. He is not toxic-appearing or diaphoretic.  HENT:     Head: Normocephalic and atraumatic.  Eyes:     Extraocular Movements: Extraocular movements intact.     Conjunctiva/sclera: Conjunctivae normal.  Pulmonary:     Effort: Pulmonary effort is normal.  Musculoskeletal:     Comments: Mild tenderness to palpation to left lateral shoulder.  Pain with range of motion but patient does have range of motion fully.  No crepitus noted.  Grip strength 5/5.  Neurovascular intact.  Neurological:     General: No focal deficit present.     Mental Status: He is alert and oriented to person, place, and time. Mental status is at baseline.  Psychiatric:        Mood and Affect: Mood  normal.        Behavior: Behavior normal.        Thought Content: Thought content normal.        Judgment: Judgment normal.     UC Treatments / Results  Labs (all labs ordered are listed, but only abnormal results are displayed) Labs Reviewed - No data to display  EKG   Radiology DG Shoulder Left  Result Date: 02/12/2022 CLINICAL DATA:  shoulder pain EXAM:  LEFT SHOULDER - 2+ VIEW COMPARISON:  02/09/2021 FINDINGS: There is no evidence of fracture or dislocation. There is no evidence of arthropathy or other focal bone abnormality. Soft tissues are unremarkable. IMPRESSION: Negative. Electronically Signed   By: Lucrezia Europe M.D.   On: 02/12/2022 12:23   VAS [place] ABI WITH/WO TBI  Result Date: 02/10/2022  LOWER EXTREMITY DOPPLER STUDY Patient Name:  Martin [name]  Date of Exam:   02/10/2022 Medical Rec #: [id number]        Accession #:    [id number] Date of Birth: September 04, 1961        Patient Gender: M Patient Age:   60 years Exam Location:  [facility] Procedure:      VAS [place] ABI WITH/WO TBI Referring Phys: --------------------------------------------------------------------------------   Vascular Interventions: 01/2022 left pta stent of SFA/Pop                         06/2021; 07/2021 multiple interventions bilaterally.                         Right EIA stent, Bilate PTA's throughout. Comparison Study: 01/2022 Performing Technologist: Concha Norway RVT  Examination Guidelines: A complete evaluation includes at minimum, Doppler waveform signals and systolic blood pressure reading at the level of bilateral brachial, anterior tibial, and posterior tibial arteries, when vessel segments are accessible. Bilateral testing is considered an integral part of a complete examination. Photoelectric Plethysmograph (PPG) waveforms and toe systolic pressure readings are included as required and additional duplex testing as needed. Limited examinations for reoccurring indications may be performed as noted.  ABI Findings: +---------+------------------+-----+---------+--------+ Right    Rt Pressure (mmHg)IndexWaveform Comment  +---------+------------------+-----+---------+--------+ Brachial 123                                      +---------+------------------+-----+---------+--------+ ATA      127               1.02 triphasic          +---------+------------------+-----+---------+--------+ PTA      130               1.04 triphasic         +---------+------------------+-----+---------+--------+ Great Toe106               0.85 Normal            +---------+------------------+-----+---------+--------+ +---------+------------------+-----+---------+-------+ Left     Lt Pressure (mmHg)IndexWaveform Comment +---------+------------------+-----+---------+-------+ Brachial 125                                     +---------+------------------+-----+---------+-------+ ATA      137               1.10 triphasic        +---------+------------------+-----+---------+-------+ PTA  137               1.10 biphasic         +---------+------------------+-----+---------+-------+ Great Toe93                0.74 Normal           +---------+------------------+-----+---------+-------+ +-------+-----------+-----------+------------+------------+ ABI/TBIToday's ABIToday's TBIPrevious ABIPrevious TBI +-------+-----------+-----------+------------+------------+ Right  1.04       .85        1.14        .86          +-------+-----------+-----------+------------+------------+ Left   1.10       .74        1.05        .43          +-------+-----------+-----------+------------+------------+  Left TBIs appear increased compared to prior study on 01/2022.  Summary: Right: Resting right ankle-brachial index is within normal range. No evidence of significant right lower extremity arterial disease. The right toe-brachial index is normal. Left: Resting left ankle-brachial index is within normal range. No evidence of significant left lower extremity arterial disease. The left toe-brachial index is normal. *See table(s) above for measurements and observations.  Electronically signed by [name] MD on 02/10/2022 at 5:53:59 PM.    Final     Procedures Procedures (including critical care time)  Medications Ordered in  UC Medications - No data to display  Initial Impression / Assessment and Plan / UC Course  I have reviewed the triage vital signs and the nursing notes.  Pertinent labs & imaging results that were available during my care of the patient were reviewed by me and considered in my medical decision making (see chart for details).     Left shoulder x-ray negative for any acute bony abnormality.  Suspect muscular strain/injury.  Discussed supportive care and ice application.  Limited options on pain management given that patient takes blood thinner medications.  Patient will need to follow-up with orthopedist for further evaluation and management at provided contact information.  Discussed return precautions.  Patient verbalized understanding and was agreeable with plan. Final Clinical Impressions(s) / UC Diagnoses   Final diagnoses:  Acute pain of left shoulder     Discharge Instructions      Your x-ray was normal.  Please apply ice to affected area.  Follow-up with provided contact information for orthopedist for further evaluation and management.    ED Prescriptions   None    PDMP not reviewed this encounter.   Teodora Medici, [place] 02/12/22 1242

## 2022-02-12 NOTE — ED Triage Notes (Signed)
Pt c/o left shoulder pain denies injury

## 2022-02-12 NOTE — Discharge Instructions (Signed)
Your x-ray was normal.  Please apply ice to affected area.  Follow-up with provided contact information for orthopedist for further evaluation and management.

## 2022-02-16 ENCOUNTER — Encounter (INDEPENDENT_AMBULATORY_CARE_PROVIDER_SITE_OTHER): Payer: Self-pay | Admitting: Vascular Surgery

## 2022-02-16 NOTE — Progress Notes (Signed)
MRN : [id number]  Martin [name] is a 61 y.o. (04/30/1961) male who presents with chief complaint of check circulation.  History of Present Illness: The patient returns to the office for followup and review status post angiogram with intervention on 01/14/2022.   Procedure:  Percutaneous transluminal angioplasty and stent placement left superficial femoral and popliteal arteries to 5 mm 2.    Percutaneous transluminal angioplasty left anterior tibial to 4 mm  The patient notes improvement in the lower extremity symptoms. No interval shortening of the patient's claudication distance or rest pain symptoms. No new ulcers or wounds have occurred since the last visit.  There have been no significant changes to the patient's overall health care.  No documented history of amaurosis fugax or recent TIA symptoms. There are no recent neurological changes noted. No documented history of DVT, PE or superficial thrombophlebitis. The patient denies recent episodes of angina or shortness of breath.   ABI's Rt=1.04 and Lt=1.10  (previous ABI's Rt=1.14 and Lt=1.05)   Current Meds  Medication Sig   acetaminophen (TYLENOL) 500 MG tablet Take 1,000 mg by mouth every 6 (six) hours as needed for mild pain.   apixaban (ELIQUIS) 2.5 MG TABS tablet Take 1 tablet (2.5 mg total) by mouth 2 (two) times daily.   clopidogrel (PLAVIX) 75 MG tablet Take 1 tablet by mouth once daily   econazole nitrate 1 % cream Apply topically daily.   EPINEPHrine (EPIPEN 2-PAK) 0.3 mg/0.3 mL IJ SOAJ injection Inject 0.3 mLs (0.3 mg total) into the muscle as needed for anaphylaxis.   fluticasone furoate-vilanterol (BREO ELLIPTA) 100-25 MCG/ACT AEPB Inhale 1 puff into the lungs daily.   folic acid (FOLVITE) 1 MG tablet Take 1 tablet (1 mg total) by mouth daily.   Ipratropium-Albuterol (COMBIVENT RESPIMAT) 20-100 MCG/ACT AERS respimat Inhale 1 puff into the lungs every 6 (six) hours as needed for wheezing or shortness of breath.    methadone (DOLOPHINE) 10 MG/ML solution Take 70 mg by mouth daily. Confirmed with [facility] 10/12/21   [DISCONTINUED] tamsulosin (FLOMAX) 0.4 MG CAPS capsule Take 1 capsule (0.4 mg total) by mouth at bedtime.    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   Peripheral vascular disease ([place])    Pneumonia    as child, cough at present with no fever   Pneumonia    as child, cough at present with no fever    Radiation 11/19/11-01/08/12   5040 cGy 28 fx Pelvis and inguinal area   Stroke ([place])    no weaknes or paralysis   Substance abuse [facility])     Past Surgical History:  Procedure Laterality Date   APPENDECTOMY     age 2 or 34   EXAMINATION UNDER ANESTHESIA  10/14/2011  Procedure: EXAM UNDER ANESTHESIA;  Surgeon: Earnstine Regal, MD;  Location: [facility];  Service: General;  Laterality: N/A;  exam under anethesia, Excision of mass anal canal, 1.5cm   INGUINAL HERNIA REPAIR  05/11/2012   Procedure: HERNIA REPAIR INGUINAL ADULT;  Surgeon: Earnstine Regal, MD;  Location: [facility];  Service: General;  Laterality: Left;  left inguinal hernia repair with mesh   LOWER EXTREMITY ANGIOGRAPHY Left 07/02/2021   Procedure: LOWER EXTREMITY ANGIOGRAPHY;  Surgeon: Katha Cabal, MD;  Location: [place] [facility];  Service: Cardiovascular;  Laterality: Left;   LOWER EXTREMITY  ANGIOGRAPHY Right 07/16/2021   Procedure: LOWER EXTREMITY ANGIOGRAPHY;  Surgeon: Katha Cabal, MD;  Location: [place] [facility];  Service: Cardiovascular;  Laterality: Right;   LOWER EXTREMITY ANGIOGRAPHY Left 08/27/2021   Procedure: LOWER EXTREMITY ANGIOGRAPHY;  Surgeon: Katha Cabal, MD;  Location: [place] [facility];  Service: Cardiovascular;  Laterality: Left;   LOWER EXTREMITY ANGIOGRAPHY Left 01/14/2022   Procedure: Lower Extremity Angiography;  Surgeon: Katha Cabal, MD;  Location: [place] [facility];  Service: Cardiovascular;  Laterality: Left;   SMALL INTESTINE SURGERY  04/19/2017   exploratory lap, partial small bowel resection for perforated ulcer/abcess, radiation enteritis   surgical pathology   10/14/2011   squamous cell ca of anus   TEE WITHOUT CARDIOVERSION N/A 10/16/2021   Procedure: TRANSESOPHAGEAL ECHOCARDIOGRAM (TEE);  Surgeon: [name], MD;  Location: [facility];  Service: Cardiovascular;  Laterality: N/A;   TOOTH EXTRACTION N/A 12/04/2021   Procedure: DENTAL RESTORATION/EXTRACTIONS;  Surgeon: Diona Browner, DMD;  Location: [facility];  Service: Oral Surgery;  Laterality: N/A;   transanal excision  10/14/2011   Dr.Todd Gerkin    Social History Social History   Tobacco Use   Smoking status: Every Day    Packs/day: 1.00    Years: 47.00    Total pack years: 47.00    Types: Cigarettes   Smokeless tobacco: Never   Tobacco comments:    Cutting back>> Quit Smart card supplied  Vaping Use   Vaping Use: Never used  Substance Use Topics   Alcohol use: Not Currently    Comment: stopped years ago   Drug use: Not Currently    Types: Marijuana, Cocaine    Comment: occasional marijuana, last Cocaine use 2-3 weeks (as of 12/03/21)    Family History Family History  Problem Relation Age of Onset   Colon cancer Father    Asthma Father    Cancer Father        prostate   Hypertension Father    Asthma Mother    Hypertension Mother    Stomach  cancer Neg Hx    Esophageal cancer Neg Hx     Allergies  Allergen Reactions   Aspirin Swelling    Lip swelling   Yellow Jacket Venom [Bee Venom] Anaphylaxis   Codeine Nausea Only   Ibuprofen     On blood thinners   Morphine And Related Itching     REVIEW OF SYSTEMS (Negative unless checked)  Constitutional: [] Weight loss  [] Fever  [] Chills Cardiac: [] Chest pain   [] Chest pressure   [] Palpitations   [] Shortness of breath when laying flat   [] Shortness of breath with exertion. Vascular:  [x] Pain in legs with walking   [] Pain in legs at rest  [] History of DVT   [] Phlebitis   [] Swelling in legs   [] Varicose veins   [] Non-healing ulcers Pulmonary:   [] Uses home oxygen   [] Productive  cough   [] Hemoptysis   [] Wheeze  [x] COPD   [] Asthma Neurologic:  [] Dizziness   [] Seizures   [] History of stroke   [] History of TIA  [] Aphasia   [] Vissual changes   [] Weakness or numbness in arm   [] Weakness or numbness in leg Musculoskeletal:   [] Joint swelling   [x] Joint pain   [] Low back pain Hematologic:  [] Easy bruising  [] Easy bleeding   [] Hypercoagulable state   [] Anemic Gastrointestinal:  [] Diarrhea   [] Vomiting  [x] Gastroesophageal reflux/heartburn   [] Difficulty swallowing. Genitourinary:  [] Chronic kidney disease   [] Difficult urination  [] Frequent urination   [] Blood in urine Skin:  [] Rashes   [] Ulcers  Psychological:  [] History of anxiety   []  History of major depression.  Physical Examination  Vitals:   02/10/22 1519  BP: 136/62  Pulse: 65  Weight: 142 lb 9.6 oz (64.7 kg)  Height: 5' 11"  (1.803 m)   Body mass index is 19.89 kg/m. Gen: WD/WN, NAD Head: [place]/AT, No temporalis wasting.  Ear/Nose/Throat: Hearing grossly intact, nares w/o erythema or drainage Eyes: PER, EOMI, sclera nonicteric.  Neck: Supple, no masses.  No bruit or JVD.  Pulmonary:  Good air movement, no audible wheezing, no use of accessory muscles.  Cardiac: RRR, normal S1, S2, no Murmurs. Vascular:  mild trophic  changes, no open wounds Vessel Right Left  Radial Palpable Palpable  PT Not Palpable Not Palpable  DP Not Palpable Not Palpable  Gastrointestinal: soft, non-distended. No guarding/no peritoneal signs.  Musculoskeletal: M/S 5/5 throughout.  No visible deformity.  Neurologic: CN 2-12 intact. Pain and light touch intact in extremities.  Symmetrical.  Speech is fluent. Motor exam as listed above. Psychiatric: Judgment intact, Mood & affect appropriate for pt's clinical situation. Dermatologic: No rashes or ulcers noted.  No changes consistent with cellulitis.   CBC Lab Results  Component Value Date   WBC 8.5 12/04/2021   HGB 11.7 (L) 12/04/2021   HCT 34.7 (L) 12/04/2021   MCV 89.4 12/04/2021   PLT 150 12/04/2021    BMET    Component Value Date/Time   NA 135 12/04/2021 0612   NA 138 06/06/2021 1134   NA 141 10/19/2014 1204   K 4.1 12/04/2021 0612   K 4.6 10/19/2014 1204   CL 105 12/04/2021 0612   CO2 22 12/04/2021 0612   CO2 26 10/19/2014 1204   GLUCOSE 81 12/04/2021 0612   GLUCOSE 91 10/19/2014 1204   BUN 14 01/14/2022 1140   BUN 12 06/06/2021 1134   BUN 10.8 10/19/2014 1204   CREATININE 0.73 01/14/2022 1140   CREATININE 0.83 07/05/2015 1042   CREATININE 0.9 10/19/2014 1204   CALCIUM 9.1 12/04/2021 0612   CALCIUM 9.6 10/19/2014 1204   GFRNONAA >60 01/14/2022 1140   GFRNONAA >89 07/05/2015 1042   GFRAA >60 03/10/2020 1219   GFRAA >89 07/05/2015 1042   CrCl cannot be calculated (Patient's most recent lab result is older than the maximum 21 days allowed.).  COAG No results found for: "INR", "PROTIME"  Radiology DG Shoulder Left  Result Date: 02/12/2022 CLINICAL DATA:  shoulder pain EXAM: LEFT SHOULDER - 2+ VIEW COMPARISON:  02/09/2021 FINDINGS: There is no evidence of fracture or dislocation. There is no evidence of arthropathy or other focal bone abnormality. Soft tissues are unremarkable. IMPRESSION: Negative. Electronically Signed   By: Lucrezia Europe M.D.   On:  02/12/2022 12:23   VAS [place] ABI WITH/WO TBI  Result Date: 02/10/2022  LOWER EXTREMITY DOPPLER STUDY Patient Name:  EUGEN JEANSONNE  Date of Exam:   02/10/2022 Medical Rec #: [id number]        Accession #:    [id number] Date of Birth: [birth year]        Patient Gender: M Patient Age:   74 years Exam Location:  [facility] Procedure:      VAS [place] ABI WITH/WO TBI Referring Phys: --------------------------------------------------------------------------------   Vascular Interventions: 01/2022 left pta stent of SFA/Pop                         06/2021; 07/2021 multiple interventions bilaterally.                         Right EIA stent, Bilate PTA's throughout. Comparison Study: 01/2022 Performing Technologist: Concha Norway RVT  Examination Guidelines: A complete evaluation includes at minimum, Doppler waveform signals and systolic blood pressure reading at the level of bilateral brachial, anterior tibial, and posterior tibial arteries, when vessel segments are accessible. Bilateral testing is considered an integral part of a complete examination. Photoelectric Plethysmograph (PPG) waveforms and toe systolic pressure readings are included as required and additional duplex testing as needed. Limited examinations for reoccurring indications may be performed as noted.  ABI Findings: +---------+------------------+-----+---------+--------+ Right    Rt Pressure (mmHg)IndexWaveform Comment  +---------+------------------+-----+---------+--------+ Brachial 123                                      +---------+------------------+-----+---------+--------+ ATA      127               1.02 triphasic         +---------+------------------+-----+---------+--------+ PTA      130               1.04 triphasic         +---------+------------------+-----+---------+--------+ Great Toe106               0.85 Normal            +---------+------------------+-----+---------+--------+  +---------+------------------+-----+---------+-------+ Left     Lt Pressure (mmHg)IndexWaveform Comment +---------+------------------+-----+---------+-------+ Brachial 125                                     +---------+------------------+-----+---------+-------+ ATA      137               1.10 triphasic        +---------+------------------+-----+---------+-------+ PTA      137               1.10 biphasic         +---------+------------------+-----+---------+-------+ Great Toe93                0.74 Normal           +---------+------------------+-----+---------+-------+ +-------+-----------+-----------+------------+------------+ ABI/TBIToday's ABIToday's TBIPrevious ABIPrevious TBI +-------+-----------+-----------+------------+------------+ Right  1.04       .85        1.14        .86          +-------+-----------+-----------+------------+------------+ Left   1.10       .74        1.05        .43          +-------+-----------+-----------+------------+------------+  Left TBIs appear increased compared to prior study on 01/2022.  Summary:  Right: Resting right ankle-brachial index is within normal range. No evidence of significant right lower extremity arterial disease. The right toe-brachial index is normal. Left: Resting left ankle-brachial index is within normal range. No evidence of significant left lower extremity arterial disease. The left toe-brachial index is normal. *See table(s) above for measurements and observations.  Electronically signed by [name] MD on 02/10/2022 at 5:53:59 PM.    Final    DG Chest 2 View  Result Date: 01/22/2022 CLINICAL DATA:  Shortness of breath and chest pain. EXAM: CHEST - 2 VIEW COMPARISON:  Chest radiograph dated 10/11/2021. FINDINGS: The heart size and mediastinal contours are within normal limits. The lungs are hyperinflated, consistent with emphysema. Both lungs are clear. The visualized skeletal structures are  unremarkable. IMPRESSION: No active cardiopulmonary disease. Emphysema (ICD10-J43.9). Electronically Signed   By: Zerita Boers M.D.   On: 01/22/2022 19:26     Assessment/Plan 1. 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  The patient reports that the claudication symptoms and leg pain has improved.   The patient denies lifestyle limiting changes at this point in time.  No further invasive studies, angiography or surgery at this time The patient should continue walking and begin a more formal exercise program.  The patient should continue antiplatelet therapy and aggressive treatment of the lipid abnormalities  Continued surveillance is indicated as atherosclerosis is likely to progress with time.    Patient should undergo noninvasive studies as ordered. The patient will follow up with me to review the studies.   - VAS [place] ABI WITH/WO TBI - VAS [place] LOWER EXTREMITY ARTERIAL DUPLEX; Future - VAS [place] ABI WITH/WO TBI; Future  2. Status post angioplasty with stent Recommend:  The patient is status post successful angiogram with intervention.  The patient reports that the claudication symptoms and leg pain has improved.   The patient denies lifestyle limiting changes at this point in time.  No further invasive studies, angiography or surgery at this time The patient should continue walking and begin a more formal exercise program.  The patient should continue antiplatelet therapy and aggressive treatment of the lipid abnormalities  Continued surveillance is indicated as atherosclerosis is likely to progress with time.    Patient should undergo noninvasive studies as ordered. The patient will follow up with me to review the studies.   - VAS [place] ABI WITH/WO TBI  3. Primary hypertension Continue antihypertensive medications as already ordered, these medications have been reviewed and there  are no changes at this time.   4. Pulmonary emphysema, unspecified emphysema type ([place]) Continue pulmonary medications and aerosols as already ordered, these medications have been reviewed and there are no changes at this time.    5. Gastroesophageal reflux disease without esophagitis Continue PPI as already ordered, this medication has been reviewed and there are no changes at this time.  Avoidence of caffeine and alcohol  Moderate elevation of the head of the bed      [name], MD  02/16/2022 1:01 PM

## 2022-02-17 DIAGNOSIS — F1120 Opioid dependence, uncomplicated: Secondary | ICD-10-CM | POA: Diagnosis not present

## 2022-02-24 DIAGNOSIS — F1120 Opioid dependence, uncomplicated: Secondary | ICD-10-CM | POA: Diagnosis not present

## 2022-02-28 ENCOUNTER — Other Ambulatory Visit (INDEPENDENT_AMBULATORY_CARE_PROVIDER_SITE_OTHER): Payer: Self-pay | Admitting: Vascular Surgery

## 2022-03-02 ENCOUNTER — Encounter: Payer: Self-pay | Admitting: Emergency Medicine

## 2022-03-02 ENCOUNTER — Ambulatory Visit
Admission: EM | Admit: 2022-03-02 | Discharge: 2022-03-02 | Disposition: A | Discharge status: Home / Self Care | Payer: Medicare HMO | Attending: Physician Assistant | Admitting: Physician Assistant

## 2022-03-02 DIAGNOSIS — R5383 Other fatigue: Secondary | ICD-10-CM

## 2022-03-02 MED ORDER — ONDANSETRON HCL 4 MG PO TABS: 4.0000 mg | ORAL_TABLET | Freq: Four times a day (QID) | ORAL | 0 refills | Status: DC

## 2022-03-02 NOTE — ED Triage Notes (Signed)
Pt reports fatigue for over a week.  States yesterday he began dry heaving and feeling like he was going to pass out. Denies syncope episode.

## 2022-03-03 DIAGNOSIS — F1120 Opioid dependence, uncomplicated: Secondary | ICD-10-CM | POA: Diagnosis not present

## 2022-03-04 LAB — BASIC METABOLIC PANEL
BUN/Creatinine Ratio: 14 (ref 10–24)
BUN: 11 mg/dL (ref 8–27)
CO2: 20 mmol/L (ref 20–29)
Calcium: 10.1 mg/dL (ref 8.6–10.2)
Chloride: 102 mmol/L (ref 96–106)
Creatinine, Ser: 0.77 mg/dL (ref 0.76–1.27)
Glucose: 89 mg/dL (ref 70–99)
Potassium: 4.5 mmol/L (ref 3.5–5.2)
Sodium: 137 mmol/L (ref 134–144)
eGFR: 101 mL/min/{1.73_m2} (ref >59)

## 2022-03-04 LAB — CBC
Hematocrit: 36.6 % — ABNORMAL LOW (ref 37.5–51.0)
Hemoglobin: 12.1 g/dL — ABNORMAL LOW (ref 13.0–17.7)
MCH: 28.3 pg (ref 26.6–33.0)
MCHC: 33.1 g/dL (ref 31.5–35.7)
MCV: 86 fL (ref 79–97)
Platelets: 157 10*3/uL (ref 150–450)
RBC: 4.28 x10E6/uL (ref 4.14–5.80)
RDW: 13.9 % (ref 11.6–15.4)
WBC: 8.9 10*3/uL (ref 3.4–10.8)

## 2022-03-10 DIAGNOSIS — F1120 Opioid dependence, uncomplicated: Secondary | ICD-10-CM | POA: Diagnosis not present

## 2022-03-12 ENCOUNTER — Telehealth (INDEPENDENT_AMBULATORY_CARE_PROVIDER_SITE_OTHER): Payer: Self-pay | Admitting: Vascular Surgery

## 2022-03-12 NOTE — Telephone Encounter (Signed)
Patient left a VM asking for refill on his blood thinner medication. He is supposed to be taking 2 blood thinners and keep having a problem refilling the 2nd one and requesting the medication have refills on it when sent to the [place] ([place] - [address]   clopidogrel (PLAVIX) 75 MG tablet    Please call and advise

## 2022-03-12 NOTE — Telephone Encounter (Signed)
New prescription for Clopidogrel 75 with 5 refills has been called into pharmacy. Patient has been made aware

## 2022-03-14 ENCOUNTER — Ambulatory Visit (INDEPENDENT_AMBULATORY_CARE_PROVIDER_SITE_OTHER): Payer: Medicare HMO

## 2022-03-14 ENCOUNTER — Ambulatory Visit
Admission: EM | Admit: 2022-03-14 | Discharge: 2022-03-14 | Disposition: A | Discharge status: Home / Self Care | Payer: Medicare HMO | Attending: Internal Medicine | Admitting: Internal Medicine

## 2022-03-14 ENCOUNTER — Ambulatory Visit: Payer: Medicare HMO

## 2022-03-14 DIAGNOSIS — M25512 Pain in left shoulder: Secondary | ICD-10-CM | POA: Diagnosis not present

## 2022-03-14 DIAGNOSIS — M25511 Pain in right shoulder: Secondary | ICD-10-CM

## 2022-03-14 DIAGNOSIS — M19012 Primary osteoarthritis, left shoulder: Secondary | ICD-10-CM | POA: Diagnosis not present

## 2022-03-14 MED ORDER — METHOCARBAMOL 500 MG PO TABS: 500.0000 mg | ORAL_TABLET | Freq: Every evening | ORAL | 0 refills | Status: DC | PRN

## 2022-03-14 MED ORDER — PREDNISONE 20 MG PO TABS: 20.0000 mg | ORAL_TABLET | Freq: Every day | ORAL | 0 refills | Status: AC

## 2022-03-14 NOTE — ED Triage Notes (Signed)
Pt presents with constant sharp left shoulder pain X 2 weeks with no known injury.

## 2022-03-14 NOTE — Discharge Instructions (Addendum)
Rest and elevate the affected painful area.   Apply warm compresses intermittently as needed.   As pain recedes, begin normal activities slowly as tolerated. Return if symptoms persist or worsens. If symptoms worsen or persist, orthopedic surgery referral will be appropriate

## 2022-03-14 NOTE — ED Provider Notes (Addendum)
[name]    CSN: [id number] Arrival date & time: 03/14/22  1023      History   Chief Complaint Chief Complaint  Patient presents with   Shoulder Pain    HPI Martin [name] is a 61 y.o. male comes to the urgent care with left shoulder pain of 2 weeks duration.  Patient describes the pain as sharp and of moderate severity.  Pain is aggravated by raising the left arm over his head.  No known relieving factors.  He has not tried any over-the-counter medications.  No numbness or tingling in the left upper extremity.  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   Peripheral vascular disease ([place])    Pneumonia    as child, cough at present with no fever   Pneumonia    as child, cough at present with no fever    Radiation 11/19/11-01/08/12   5040 cGy 28 fx Pelvis and inguinal area   Stroke ([place])    no weaknes or paralysis   Substance abuse ([place])     Patient Active Problem List   Diagnosis Date Noted   Sepsis 10/12/2021   Peripheral vascular disease    AKI    Tobacco use    Emphysema    BPH     Periodontal disease    IVDU, cocaine use    Demand ischemia    Thrombocytopenia    Normocytic anemia    MSSA bacteremia 10/11/2021   Callus 06/12/2021   Hav (hallux abducto valgus), unspecified laterality 06/12/2021   Hammer toes,  bilateral 06/12/2021   Atherosclerosis of native arteries of extremity with intermittent claudication ([place]) 06/12/2021   Hyperlipidemia 06/07/2021   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   Elevated blood-pressure reading, without diagnosis of hypertension 09/26/2020   Body mass index (BMI) 29.0-29.9, adult 08/29/2020   Partial small bowel obstruction ([place]) 03/10/2020   Small bowel obstruction due to adhesions ([place]) 03/08/2020   Enteritis 10/15/2019   Abnormal liver function 10/15/2019   Leukocytosis 10/15/2019   Nausea & vomiting 10/15/2019   GAD (generalized anxiety disorder) 05/18/2017   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   Constipation 04/11/2017   Bee sting 04/05/2017   Diverticulosis 11/29/2014   Healthcare maintenance 11/06/2014   Cough 10/05/2014   Insomnia 09/13/2014   Erectile dysfunction 11/30/2013   Chronic pain syndrome 11/30/2013   Depression 11/30/2013   GERD (gastroesophageal reflux disease)    Hypertension    Anxiety    Arthritis    Osteoarthritis of lumbar spine    Radiation    Night sweats 08/12/2012   Tobacco abuse counseling 05/26/2012   Lymphocytic colitis 05/03/2012   Vitamin B12 deficiency 03/19/2012   Folate deficiency 03/19/2012  Inguinal hernia 11/24/2011   Cancer ([place]) 10/14/2011   Internal hemorrhoid 09/11/2011   Anal cancer 10/09/2010    Past Surgical History:  Procedure Laterality Date   APPENDECTOMY     age 19 or 89   EXAMINATION UNDER ANESTHESIA  10/14/2011   Procedure: EXAM UNDER ANESTHESIA;  Surgeon: Earnstine Regal, MD;  Location: [facility];  Service: General;  Laterality: N/A;  exam under anethesia, Excision of mass anal canal, 1.5cm   INGUINAL HERNIA REPAIR  05/11/2012   Procedure: HERNIA REPAIR INGUINAL ADULT;  Surgeon: Earnstine Regal, MD;  Location: [facility];  Service: General;  Laterality: Left;  left inguinal hernia repair with mesh   LOWER EXTREMITY ANGIOGRAPHY Left 07/02/2021   Procedure: LOWER  EXTREMITY ANGIOGRAPHY;  Surgeon: Katha Cabal, MD;  Location: [facility];  Service: Cardiovascular;  Laterality: Left;   LOWER EXTREMITY ANGIOGRAPHY Right 07/16/2021   Procedure: LOWER EXTREMITY ANGIOGRAPHY;  Surgeon: Katha Cabal, MD;  Location: [facility];  Service: Cardiovascular;  Laterality: Right;   LOWER EXTREMITY ANGIOGRAPHY Left 08/27/2021   Procedure: LOWER EXTREMITY ANGIOGRAPHY;  Surgeon: Katha Cabal, MD;  Location: [facility];  Service: Cardiovascular;  Laterality: Left;   LOWER EXTREMITY ANGIOGRAPHY Left 01/14/2022   Procedure: Lower Extremity Angiography;  Surgeon: Katha Cabal, MD;  Location: [facility];  Service: Cardiovascular;  Laterality: Left;   SMALL INTESTINE SURGERY  04/19/2017   exploratory lap, partial small bowel resection for perforated ulcer/abcess, radiation enteritis   surgical pathology   10/14/2011   squamous cell ca of anus   TEE WITHOUT CARDIOVERSION N/A 10/16/2021   Procedure: TRANSESOPHAGEAL ECHOCARDIOGRAM (TEE);  Surgeon: [name], MD;  Location: [facility];  Service: Cardiovascular;  Laterality: N/A;   TOOTH EXTRACTION N/A 12/04/2021   Procedure: DENTAL RESTORATION/EXTRACTIONS;  Surgeon: Diona Browner, DMD;  Location: [facility];  Service: Oral Surgery;  Laterality: N/A;   transanal excision  10/14/2011   Dr.Todd Gerkin       Home Medications    Prior to Admission medications   Medication Sig Start Date End Date Taking? Authorizing Provider  methocarbamol (ROBAXIN) 500 MG tablet Take 1 tablet (500 mg total) by mouth at bedtime as needed for muscle spasms. 03/14/22  Yes Brenly Trawick, Myrene Galas, MD  predniSONE (DELTASONE) 20 MG tablet Take 1 tablet (20 mg total) by mouth daily for 5 days. 03/14/22 03/19/22 Yes Merrissa Giacobbe, Myrene Galas, MD  acetaminophen (TYLENOL) 500 MG tablet Take 1,000 mg by mouth every 6 (six) hours as needed for mild pain.    [provider]  apixaban (ELIQUIS) 2.5 MG TABS  tablet Take 1 tablet (2.5 mg total) by mouth 2 (two) times daily. 01/14/22   Schnier, Dolores Lory, MD  clopidogrel (PLAVIX) 75 MG tablet Take 1 tablet by mouth once daily 02/28/22   Schnier, Dolores Lory, MD  econazole nitrate 1 % cream Apply topically daily. 01/17/22   Nyoka Lint, PA-C  EPINEPHrine (EPIPEN 2-PAK) 0.3 mg/0.3 mL IJ SOAJ injection Inject 0.3 mLs (0.3 mg total) into the muscle as needed for anaphylaxis. 02/23/20   Long, Wonda Olds, MD  fluticasone furoate-vilanterol (BREO ELLIPTA) 100-25 MCG/ACT AEPB Inhale 1 puff into the lungs daily. 01/22/22   Crain, Whitney L, PA  folic acid (FOLVITE) 1 MG tablet Take 1 tablet (1 mg total) by mouth daily. 10/18/21   Elgergawy, Silver Huguenin, MD  Ipratropium-Albuterol (COMBIVENT RESPIMAT) 20-100 MCG/ACT AERS respimat Inhale 1 puff into the lungs every 6 (  six) hours as needed for wheezing or shortness of breath. 01/22/22   Crain, Whitney L, PA  methadone (DOLOPHINE) 10 MG/ML solution Take 70 mg by mouth daily. Confirmed with [facility] 10/12/21    [provider]  ondansetron (ZOFRAN) 4 MG tablet Take 1 tablet (4 mg total) by mouth every 6 (six) hours. 03/02/22   Ward, Lenise Arena, PA-C  tamsulosin (FLOMAX) 0.4 MG CAPS capsule Take 1 capsule by mouth at bedtime 02/11/22   Camillia Herter, NP  sucralfate (CARAFATE) 1 GM/10ML suspension Take 10 mLs (1 g total) by mouth 4 (four) times daily -  with meals and at bedtime. Patient not taking: Reported on 11/28/2019 10/21/19 06/13/20  Randal Buba, April, MD    Family History Family History  Problem Relation Age of Onset   Colon cancer Father    Asthma Father    Cancer Father        prostate   Hypertension Father    Asthma Mother    Hypertension Mother    Stomach cancer Neg Hx    Esophageal cancer Neg Hx     Social History Social History   Tobacco Use   Smoking status: Every Day    Packs/day: 1.00    Years: 47.00    Total pack years: 47.00    Types: Cigarettes   Smokeless tobacco: Never   Tobacco  comments:    Cutting back>> Quit Smart card supplied  Vaping Use   Vaping Use: Never used  Substance Use Topics   Alcohol use: Not Currently    Comment: stopped years ago   Drug use: Not Currently    Types: Marijuana, Cocaine    Comment: occasional marijuana, last Cocaine use 2-3 weeks (as of 12/03/21)     Allergies   Aspirin, Yellow jacket venom [bee venom], Codeine, Ibuprofen, and Morphine and related   Review of Systems Review of Systems  Constitutional: Negative.   Respiratory: Negative.    Cardiovascular: Negative.   Gastrointestinal: Negative.   Musculoskeletal:  Positive for arthralgias.  Skin: Negative.      Physical Exam Triage Vital Signs ED Triage Vitals  Enc Vitals Group     BP 03/14/22 1031 (!) 148/56     Pulse Rate 03/14/22 1031 71     Resp 03/14/22 1031 18     Temp 03/14/22 1031 98.3 F (36.8 C)     Temp Source 03/14/22 1031 Oral     SpO2 03/14/22 1031 97 %     Weight --      Height --      Head Circumference --      Peak Flow --      Pain Score 03/14/22 1030 9     Pain Loc --      Pain Edu? --      Excl. in [place]? --    No data found.  Updated Vital Signs BP (!) 148/56 (BP Location: Left Arm)   Pulse 71   Temp 98.3 F (36.8 C) (Oral)   Resp 18   SpO2 97%   Visual Acuity Right Eye Distance:   Left Eye Distance:   Bilateral Distance:    Right Eye Near:   Left Eye Near:    Bilateral Near:     Physical Exam Vitals and nursing note reviewed.  Constitutional:      General: He is not in acute distress.    Appearance: He is not ill-appearing.  Cardiovascular:     Rate and Rhythm: Normal rate and  regular rhythm.     Pulses: Normal pulses.     Heart sounds: Normal heart sounds.  Musculoskeletal:        General: Tenderness present. No swelling, deformity or signs of injury. Normal range of motion.  Skin:    General: Skin is warm.     Findings: No bruising or erythema.  Neurological:     Mental Status: He is alert.      UC  Treatments / Results  Labs (all labs ordered are listed, but only abnormal results are displayed) Labs Reviewed - No data to display  EKG   Radiology DG Shoulder Left  Result Date: 03/14/2022 CLINICAL DATA:  Left shoulder pain EXAM: LEFT SHOULDER - 2+ VIEW COMPARISON:  Left shoulder x-ray 02/12/2022 FINDINGS: No acute fracture or dislocation identified. Mild narrowing of the glenohumeral joint and acromioclavicular joint with early inferior marginal osteophyte formation. No focal bone lesions identified. IMPRESSION: Mild degenerative changes of the left shoulder. Electronically Signed   By: Ofilia Neas M.D.   On: 03/14/2022 11:43    Procedures Procedures (including critical care time)  Medications Ordered in UC Medications - No data to display  Initial Impression / Assessment and Plan / UC Course  I have reviewed the triage vital signs and the nursing notes.  Pertinent labs & imaging results that were available during my care of the patient were reviewed by me and considered in my medical decision making (see chart for details).     1.  Right shoulder pain: This is likely rotator cuff sprain X-rays negative for fracture Patient is unable to tolerate NSAIDs and is currently on methadone. Prednisone 20 mg orally daily for 5 days Robaxin at bedtime as needed for muscle tightness/spasms-medication precautions given Heating pad use recommended Return precautions given. Final Clinical Impressions(s) / UC Diagnoses   Final diagnoses:  Right shoulder pain, unspecified chronicity     Discharge Instructions      Rest and elevate the affected painful area.   Apply warm compresses intermittently as needed.   As pain recedes, begin normal activities slowly as tolerated. Return if symptoms persist or worsens. If symptoms worsen or persist, orthopedic surgery referral will be appropriate    ED Prescriptions     Medication Sig Dispense Auth. Provider   predniSONE  (DELTASONE) 20 MG tablet Take 1 tablet (20 mg total) by mouth daily for 5 days. 5 tablet Nisha Dhami, Myrene Galas, MD   methocarbamol (ROBAXIN) 500 MG tablet Take 1 tablet (500 mg total) by mouth at bedtime as needed for muscle spasms. 20 tablet Bela Bonaparte, Myrene Galas, MD      PDMP not reviewed this encounter.   Chase Picket, MD 03/14/22 [id number]    Chase Picket, MD 03/14/22 [phone]

## 2022-03-17 DIAGNOSIS — F1120 Opioid dependence, uncomplicated: Secondary | ICD-10-CM | POA: Diagnosis not present

## 2022-03-24 DIAGNOSIS — F1120 Opioid dependence, uncomplicated: Secondary | ICD-10-CM | POA: Diagnosis not present

## 2022-03-31 DIAGNOSIS — F1120 Opioid dependence, uncomplicated: Secondary | ICD-10-CM | POA: Diagnosis not present

## 2022-04-07 DIAGNOSIS — F1120 Opioid dependence, uncomplicated: Secondary | ICD-10-CM | POA: Diagnosis not present

## 2022-04-14 DIAGNOSIS — F1120 Opioid dependence, uncomplicated: Secondary | ICD-10-CM | POA: Diagnosis not present

## 2022-04-15 DIAGNOSIS — R69 Illness, unspecified: Secondary | ICD-10-CM | POA: Diagnosis not present

## 2022-04-19 ENCOUNTER — Encounter (HOSPITAL_COMMUNITY): Payer: Self-pay | Admitting: Emergency Medicine

## 2022-04-19 ENCOUNTER — Emergency Department (HOSPITAL_COMMUNITY)
Admission: EM | Admit: 2022-04-19 | Discharge: 2022-04-19 | Disposition: A | Discharge status: Home / Self Care | Payer: Medicare HMO | Attending: Emergency Medicine | Admitting: Emergency Medicine

## 2022-04-19 ENCOUNTER — Emergency Department (HOSPITAL_COMMUNITY): Payer: Medicare HMO

## 2022-04-19 ENCOUNTER — Other Ambulatory Visit: Payer: Self-pay

## 2022-04-19 DIAGNOSIS — R0602 Shortness of breath: Secondary | ICD-10-CM | POA: Diagnosis not present

## 2022-04-19 DIAGNOSIS — Z7901 Long term (current) use of anticoagulants: Secondary | ICD-10-CM | POA: Diagnosis not present

## 2022-04-19 DIAGNOSIS — R040 Epistaxis: Secondary | ICD-10-CM | POA: Diagnosis not present

## 2022-04-19 DIAGNOSIS — J439 Emphysema, unspecified: Secondary | ICD-10-CM | POA: Diagnosis not present

## 2022-04-19 DIAGNOSIS — F17210 Nicotine dependence, cigarettes, uncomplicated: Secondary | ICD-10-CM | POA: Diagnosis not present

## 2022-04-19 DIAGNOSIS — R031 Nonspecific low blood-pressure reading: Secondary | ICD-10-CM | POA: Diagnosis not present

## 2022-04-19 DIAGNOSIS — J449 Chronic obstructive pulmonary disease, unspecified: Secondary | ICD-10-CM | POA: Diagnosis not present

## 2022-04-19 DIAGNOSIS — R5383 Other fatigue: Secondary | ICD-10-CM | POA: Diagnosis not present

## 2022-04-19 DIAGNOSIS — R059 Cough, unspecified: Secondary | ICD-10-CM | POA: Diagnosis not present

## 2022-04-19 DIAGNOSIS — F1120 Opioid dependence, uncomplicated: Secondary | ICD-10-CM | POA: Diagnosis not present

## 2022-04-19 DIAGNOSIS — I10 Essential (primary) hypertension: Secondary | ICD-10-CM | POA: Diagnosis not present

## 2022-04-19 DIAGNOSIS — R042 Hemoptysis: Secondary | ICD-10-CM | POA: Diagnosis not present

## 2022-04-19 LAB — COMPREHENSIVE METABOLIC PANEL
ALT: 17 U/L (ref 0–44)
AST: 20 U/L (ref 15–41)
Albumin: 3.7 g/dL (ref 3.5–5.0)
Alkaline Phosphatase: 89 U/L (ref 38–126)
Anion gap: 4 — ABNORMAL LOW (ref 5–15)
BUN: 15 mg/dL (ref 8–23)
CO2: 24 mmol/L (ref 22–32)
Calcium: 8.9 mg/dL (ref 8.9–10.3)
Chloride: 109 mmol/L (ref 98–111)
Creatinine, Ser: 0.93 mg/dL (ref 0.61–1.24)
GFR, Estimated: >60 mL/min (ref >60)
Glucose, Bld: 91 mg/dL (ref 70–99)
Potassium: 4.4 mmol/L (ref 3.5–5.1)
Sodium: 137 mmol/L (ref 135–145)
Total Bilirubin: 0.6 mg/dL (ref 0.3–1.2)
Total Protein: 6.8 g/dL (ref 6.5–8.1)

## 2022-04-19 LAB — PROTIME-INR
INR: 1.1 (ref 0.8–1.2)
Prothrombin Time: 13.7 seconds (ref 11.4–15.2)

## 2022-04-19 LAB — CBC WITH DIFFERENTIAL/PLATELET
Abs Immature Granulocytes: 0.03 10*3/uL (ref 0.00–0.07)
Basophils Absolute: 0.1 10*3/uL (ref 0.0–0.1)
Basophils Relative: 1 %
Eosinophils Absolute: 0.3 10*3/uL (ref 0.0–0.5)
Eosinophils Relative: 3 %
HCT: 31.5 % — ABNORMAL LOW (ref 39.0–52.0)
Hemoglobin: 10.4 g/dL — ABNORMAL LOW (ref 13.0–17.0)
Immature Granulocytes: 0 %
Lymphocytes Relative: 20 %
Lymphs Abs: 2.1 10*3/uL (ref 0.7–4.0)
MCH: 29.3 pg (ref 26.0–34.0)
MCHC: 33 g/dL (ref 30.0–36.0)
MCV: 88.7 fL (ref 80.0–100.0)
Monocytes Absolute: 0.6 10*3/uL (ref 0.1–1.0)
Monocytes Relative: 6 %
Neutro Abs: 7.4 10*3/uL (ref 1.7–7.7)
Neutrophils Relative %: 70 %
Platelets: 143 10*3/uL — ABNORMAL LOW (ref 150–400)
RBC: 3.55 MIL/uL — ABNORMAL LOW (ref 4.22–5.81)
RDW: 15.6 % — ABNORMAL HIGH (ref 11.5–15.5)
WBC: 10.5 10*3/uL (ref 4.0–10.5)
nRBC: 0 % (ref 0.0–0.2)

## 2022-04-19 LAB — BRAIN NATRIURETIC PEPTIDE: B Natriuretic Peptide: 128.9 pg/mL — ABNORMAL HIGH (ref 0.0–100.0)

## 2022-04-19 MED ORDER — IOHEXOL 350 MG/ML SOLN
80.0000 mL | Freq: Once | INTRAVENOUS | Status: AC | PRN
  Administered 2022-04-19: 80 mL via INTRAVENOUS

## 2022-04-19 MED ORDER — SODIUM CHLORIDE (PF) 0.9 % IJ SOLN
INTRAMUSCULAR | Status: AC
  Filled 2022-04-19: qty 50

## 2022-04-19 NOTE — Discharge Instructions (Addendum)
You are seen today for a nosebleed.  You do not have any active hemorrhage at this time.  Your lab values are all grossly reassuring.  Your hemoglobin has dropped a little bit, however it is still at a safe level at this time.  I recommend you follow-up close with your primary care provider for reassessment next week to ensure ongoing improvement.  You may have packed gauze into the area of bleeding in the back of your mouth

## 2022-04-19 NOTE — ED Provider Triage Note (Signed)
Emergency Medicine Provider Triage Evaluation Note  Martin [name] , a 61 y.o. male  was evaluated in triage.  Pt complains of coughing up blood for two days. Patient states he takes blood thinners due to stints in bilateral legs. Patient does endorse cough, mild shortness of breath. Has hx of COPD, anal cancer, intestinal cancer. Cancer in remission for 10 years. Denies abdominal pain, chest pain, headache, blood in stool  Review of Systems  Positive:          As above Negative: As above  Physical Exam  BP (!) 152/49 (BP Location: Left Arm)   Pulse 81   Temp 98.7 F (37.1 C) (Oral)   Resp 18   Ht 5' 11"  (1.803 m)   Wt 65 kg   SpO2 98%   BMI 19.99 kg/m  Gen:   Awake, no distress   Resp:  Normal effort  MSK:   Moves extremities without difficulty  Other:    Medical Decision Making  Medically screening exam initiated at 2:21 PM.  Appropriate orders placed.  PIERS BAADE was informed that the remainder of the evaluation will be completed by another provider, this initial triage assessment does not replace that evaluation, and the importance of remaining in the ED until their evaluation is complete.     Dorothyann Peng, PA-C 04/19/22 1426

## 2022-04-19 NOTE — ED Provider Notes (Signed)
[facility] DEPT Provider Note   CSN: [id number] Arrival date & time: 04/19/22  1325     History Chief Complaint  Patient presents with   spitting up blood    HPI Martin [name] is a 61 y.o. male presenting for intermittent nosebleeds and oozing from his posterior dental surgical sites.  Patient has an extensive medical history including ongoing anticoagulation secondary to stent utilization.  He states that he has had multiple dental procedures this week and has had slow oozing from the dental procedure sites.  He is not packing the locations and has been eating full meals though he acknowledges difficulty.  Stitches are still in place. He denies fevers or chills nausea or vomiting, syncope or shortness of breath.  No active bleeding at this time..   Patient's recorded medical, surgical, social, medication list and allergies were reviewed in the Snapshot window as part of the initial history.   Review of Systems   Review of Systems  Constitutional:  Negative for chills and fever.  HENT:  Negative for ear pain and sore throat.   Eyes:  Negative for pain and visual disturbance.  Respiratory:  Negative for cough and shortness of breath.   Cardiovascular:  Negative for chest pain and palpitations.  Gastrointestinal:  Negative for abdominal pain and vomiting.  Genitourinary:  Negative for dysuria and hematuria.  Musculoskeletal:  Negative for arthralgias and back pain.  Skin:  Negative for color change and rash.  Neurological:  Negative for seizures and syncope.  All other systems reviewed and are negative.   Physical Exam Updated Vital Signs BP (!) 139/41   Pulse (!) 58   Temp 97.8 F (36.6 C) (Oral)   Resp 14   Ht 5' 11"  (1.803 m)   Wt 65 kg   SpO2 98%   BMI 19.99 kg/m  Physical Exam Vitals and nursing note reviewed.  Constitutional:      General: He is not in acute distress.    Appearance: He is well-developed.  HENT:     Head:  Normocephalic and atraumatic.     Nose:     Comments: Friable nasal nasal mucosa with no active epistaxis.  Oozing from posterior stitches in his dental site.  Controlled with gauze placement Eyes:     Conjunctiva/sclera: Conjunctivae normal.  Cardiovascular:     Rate and Rhythm: Normal rate and regular rhythm.     Heart sounds: No murmur heard. Pulmonary:     Effort: Pulmonary effort is normal. No respiratory distress.     Breath sounds: Normal breath sounds.  Abdominal:     Palpations: Abdomen is soft.     Tenderness: There is no abdominal tenderness.  Musculoskeletal:        General: No swelling.     Cervical back: Neck supple.  Skin:    General: Skin is warm and dry.     Capillary Refill: Capillary refill takes less than 2 seconds.  Neurological:     Mental Status: He is alert.  Psychiatric:        Mood and Affect: Mood normal.      ED Course/ Medical Decision Making/ A&P    Procedures Procedures   Medications Ordered in ED Medications  sodium chloride (PF) 0.9 % injection (has no administration in time range)  iohexol (OMNIPAQUE) 350 MG/ML injection 80 mL (80 mLs Intravenous Contrast Given 04/19/22 1611)    Medical Decision Making:    Martin [name] is a 61 y.o. male who  presented to the ED today with epistaxis and oral detailed above.     Patient's presentation is complicated by their history of multiple comorbid medical problems.  Patient placed on continuous vitals and telemetry monitoring while in ED which was reviewed periodically.   Complete initial physical exam performed, notably the patient  was hemodynamically stable in no acute distress.  No active hemorrhage at this time.      Reviewed and confirmed nursing documentation for past medical history, family history, social history.    Initial Assessment:   With the patient's presentation of hemoptysis, most likely diagnosis is anterior upper respiratory bleeding secondary to recent dental procedures  and anticoagulation use. Other diagnoses were considered including (but not limited to) pulmonary embolism, ACS, distal hemorrhage, posterior nasal hemorrhage. These are considered less likely due to history of present illness and physical exam findings.   This is most consistent with an acute life/limb threatening illness complicated by underlying chronic conditions.  Initial Plan:  CTA PE study ordered by triage team for evaluation of pulmonary embolism.  Grossly negative at time of assumption of care. Screening labs including CBC and Metabolic panel to evaluate for infectious or metabolic etiology of disease.  Urinalysis with reflex culture ordered to evaluate for UTI or relevant urologic/nephrologic pathology.  CXR to evaluate for structural/infectious intrathoracic pathology.  EKG to evaluate for cardiac pathology. Objective evaluation as below reviewed with plan for close reassessment  Initial Study Results:   Laboratory  All laboratory results reviewed without evidence of clinically relevant pathology.     EKG EKG was reviewed independently. Rate, rhythm, axis, intervals all examined and without medically relevant abnormality. ST segments without concerns for elevations.    Radiology  All images reviewed independently. Agree with radiology report at this time.   CT Angio Chest PE W and/or Wo Contrast  Result Date: 04/19/2022 CLINICAL DATA:  Hemoptysis EXAM: CT ANGIOGRAPHY CHEST WITH CONTRAST TECHNIQUE: Multidetector CT imaging of the chest was performed using the standard protocol during bolus administration of intravenous contrast. Multiplanar CT image reconstructions and MIPs were obtained to evaluate the vascular anatomy. RADIATION DOSE REDUCTION: This exam was performed according to the departmental dose-optimization program which includes automated exposure control, adjustment of the mA and/or kV according to patient size and/or use of iterative reconstruction technique. CONTRAST:   14m OMNIPAQUE IOHEXOL 350 MG/ML SOLN COMPARISON:  Previous studies including the CT done on 02021/07/04and chest radiograph done earlier today FINDINGS: Cardiovascular: Contrast density in the lumen of thoracic aorta is less than optimal. Scattered coronary artery calcifications are seen. There are no intraluminal filling defects in pulmonary artery branches. Mediastinum/Nodes: No significant lymphadenopathy seen. Lungs/Pleura: There are blebs and bullae in both apices and both lower lung fields. There is no focal pulmonary consolidation. Centrilobular emphysema is seen. There is no pleural effusion or pneumothorax. Upper Abdomen: There is a fluid-filled structure inseparable from the posterior margin of fundus of the stomach, possibly a diverticulum. Musculoskeletal: No acute findings are seen. Review of the MIP images confirms the above findings. IMPRESSION: There is no evidence of pulmonary artery embolism. There is no focal pulmonary consolidation. There are scattered coronary artery calcifications.  COPD. Other findings as described in the body of the report. Electronically Signed   By: PElmer PickerM.D.   On: 04/19/2022 16:34   DG Chest 2 View  Result Date: 04/19/2022 CLINICAL DATA:  Shortness of breath, hemoptysis, cough EXAM: CHEST - 2 VIEW COMPARISON:  01/22/2022 FINDINGS: Cardiac size is within normal  limits. Low position of diaphragms may suggest COPD. There are no signs of pulmonary edema or focal pulmonary consolidation. There is no pleural effusion or pneumothorax. IMPRESSION: There are no signs of pulmonary edema or focal pulmonary consolidation. Electronically Signed   By: Elmer Picker M.D.   On: 04/19/2022 15:38     Final Assessment and Plan:   Laboratory evaluation grossly reassuring at this time.  Patient reassessed and continues to be hemodynamically stable without any active bleeding.  Discussed all the above with the patient and recommend he follow-up with his primary care  provider in the outpatient setting for reassessment within 48 hours.  Patient well-appearing ambulatory tolerating p.o. intake at this time.   Disposition:  Based on the above findings, I believe patient is stable for discharge.    Patient/family educated about specific return precautions for given chief complaint and symptoms.  Patient/family educated about follow-up with PCP.     Patient/family expressed understanding of return precautions and need for follow-up. Patient spoken to regarding all imaging and laboratory results and appropriate follow up for these results. All education provided in verbal form with additional information in written form. Time was allowed for answering of patient questions. Patient discharged.    Emergency Department Medication Summary:   Medications  sodium chloride (PF) 0.9 % injection (has no administration in time range)  iohexol (OMNIPAQUE) 350 MG/ML injection 80 mL (80 mLs Intravenous Contrast Given 04/19/22 1611)         Clinical Impression:  1. Epistaxis      Discharge   Final Clinical Impression(s) / ED Diagnoses Final diagnoses:  Epistaxis    Rx / DC Orders ED Discharge Orders     None         Tretha Sciara, MD 04/19/22 2306

## 2022-04-19 NOTE — ED Triage Notes (Addendum)
Patient reports spitting up blood for the last two days, he is on blood thinners.   Hx of cancer. Recently had teeth pulled. Has been straining really hard to have BM's and this can cause a nose bleed. He has not had recent nose bleeds, in unsure why his coughing up blood. Went to UC and they said he needed a CT scan of his lungs and to go to the ED. Denies fevers, N/V/D.   Has bilateral leg stents, this is why he is on blood thinners.

## 2022-04-21 DIAGNOSIS — F1120 Opioid dependence, uncomplicated: Secondary | ICD-10-CM | POA: Diagnosis not present

## 2022-04-21 NOTE — Progress Notes (Unsigned)
Patient ID: CAMAREN TAI, male    DOB: 1961/04/14  MRN: [id number]  CC: Emergency Department Follow-Up  Subjective: Martin [name] is a 61 y.o. male who presents for emergency department follow-up.   His concerns today include:  04/19/2022 [facility] Emergency Department per MD note: HPI Martin [name] is a 61 y.o. male presenting for intermittent nosebleeds and oozing from his posterior dental surgical sites.  Patient has an extensive medical history including ongoing anticoagulation secondary to stent utilization.  He states that he has had multiple dental procedures this week and has had slow oozing from the dental procedure sites.  He is not packing the locations and has been eating full meals though he acknowledges difficulty.  Stitches are still in place. He denies fevers or chills nausea or vomiting, syncope or shortness of breath.  No active bleeding at this time.  Medical Decision Making:     Martin [name] is a 61 y.o. male who presented to the ED today with epistaxis and oral detailed above.      Patient's presentation is complicated by their history of multiple comorbid medical problems.  Patient placed on continuous vitals and telemetry monitoring while in ED which was reviewed periodically.    Complete initial physical exam performed, notably the patient  was hemodynamically stable in no acute distress.  No active hemorrhage at this time.      Reviewed and confirmed nursing documentation for past medical history, family history, social history.     Initial Assessment:   With the patient's presentation of hemoptysis, most likely diagnosis is anterior upper respiratory bleeding secondary to recent dental procedures and anticoagulation use. Other diagnoses were considered including (but not limited to) pulmonary embolism, ACS, distal hemorrhage, posterior nasal hemorrhage. These are considered less likely due to history of present illness and physical exam  findings.   This is most consistent with an acute life/limb threatening illness complicated by underlying chronic conditions.   Initial Plan:  CTA PE study ordered by triage team for evaluation of pulmonary embolism.  Grossly negative at time of assumption of care. Screening labs including CBC and Metabolic panel to evaluate for infectious or metabolic etiology of disease.  Urinalysis with reflex culture ordered to evaluate for UTI or relevant urologic/nephrologic pathology.  CXR to evaluate for structural/infectious intrathoracic pathology.  EKG to evaluate for cardiac pathology. Objective evaluation as below reviewed with plan for close reassessment   Initial Study Results:   Laboratory  All laboratory results reviewed without evidence of clinically relevant pathology.       EKG EKG was reviewed independently. Rate, rhythm, axis, intervals all examined and without medically relevant abnormality. ST segments without concerns for elevations.     Radiology  All images reviewed independently. Agree with radiology report at this time.   CT Angio Chest PE W and/or Wo Contrast   Result Date: 04/19/2022 CLINICAL DATA:  Hemoptysis EXAM: CT ANGIOGRAPHY CHEST WITH CONTRAST TECHNIQUE: Multidetector CT imaging of the chest was performed using the standard protocol during bolus administration of intravenous contrast. Multiplanar CT image reconstructions and MIPs were obtained to evaluate the vascular anatomy. RADIATION DOSE REDUCTION: This exam was performed according to the departmental dose-optimization program which includes automated exposure control, adjustment of the mA and/or kV according to patient size and/or use of iterative reconstruction technique. CONTRAST:  80mL OMNIPAQUE IOHEXOL 350 MG/ML SOLN COMPARISON:  Previous studies including the CT done on 22-Mar-2020 and chest radiograph done earlier today FINDINGS: Cardiovascular: Contrast  density in the lumen of thoracic aorta is less than  optimal. Scattered coronary artery calcifications are seen. There are no intraluminal filling defects in pulmonary artery branches. Mediastinum/Nodes: No significant lymphadenopathy seen. Lungs/Pleura: There are blebs and bullae in both apices and both lower lung fields. There is no focal pulmonary consolidation. Centrilobular emphysema is seen. There is no pleural effusion or pneumothorax. Upper Abdomen: There is a fluid-filled structure inseparable from the posterior margin of fundus of the stomach, possibly a diverticulum. Musculoskeletal: No acute findings are seen. Review of the MIP images confirms the above findings. IMPRESSION: There is no evidence of pulmonary artery embolism. There is no focal pulmonary consolidation. There are scattered coronary artery calcifications.  COPD. Other findings as described in the body of the report. Electronically Signed   By: Ernie Avena M.D.   On: 04/19/2022 16:34    DG Chest 2 View   Result Date: 04/19/2022 CLINICAL DATA:  Shortness of breath, hemoptysis, cough EXAM: CHEST - 2 VIEW COMPARISON:  01/22/2022 FINDINGS: Cardiac size is within normal limits. Low position of diaphragms may suggest COPD. There are no signs of pulmonary edema or focal pulmonary consolidation. There is no pleural effusion or pneumothorax. IMPRESSION: There are no signs of pulmonary edema or focal pulmonary consolidation. Electronically Signed   By: Ernie Avena M.D.   On: 04/19/2022 15:38      Final Assessment and Plan:   Laboratory evaluation grossly reassuring at this time.  Patient reassessed and continues to be hemodynamically stable without any active bleeding.  Discussed all the above with the patient and recommend he follow-up with his primary care provider in the outpatient setting for reassessment within 48 hours.  Patient well-appearing ambulatory tolerating p.o. intake at this time.   Disposition:  Based on the above findings, I believe patient is stable for  discharge.     Patient/family educated about specific return precautions for given chief complaint and symptoms.  Patient/family educated about follow-up with PCP.     Patient/family expressed understanding of return precautions and need for follow-up. Patient spoken to regarding all imaging and laboratory results and appropriate follow up for these results. All education provided in verbal form with additional information in written form. Time was allowed for answering of patient questions. Patient discharged.      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   Elevated blood-pressure reading, without diagnosis of hypertension 09/26/2020   Body mass index (BMI) 29.0-29.9, adult 08/29/2020   Partial small bowel obstruction (HCC) 03/10/2020   Small bowel obstruction due to adhesions (HCC) 03/08/2020   Enteritis 10/15/2019   Abnormal liver function 10/15/2019   Leukocytosis 10/15/2019   Nausea & vomiting 10/15/2019   GAD (generalized anxiety disorder) 05/18/2017   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   Constipation 04/11/2017   Bee sting 04/05/2017   Diverticulosis 11/29/2014   Healthcare maintenance 11/06/2014   Cough 10/05/2014  Insomnia 09/13/2014   Erectile dysfunction 11/30/2013   Chronic pain syndrome 11/30/2013   Depression 11/30/2013   GERD (gastroesophageal reflux disease)    Hypertension    Anxiety    Arthritis    Osteoarthritis of lumbar  spine    Radiation    Night sweats 08/12/2012   Tobacco abuse counseling 05/26/2012   Lymphocytic colitis 05/03/2012   Vitamin B12 deficiency 03/19/2012   Folate deficiency 03/19/2012   Inguinal hernia 11/24/2011   Cancer (HCC) 10/14/2011   Internal hemorrhoid 09/11/2011   Anal cancer 10/09/2010     Current Outpatient Medications on File Prior to Visit  Medication Sig Dispense Refill   acetaminophen (TYLENOL) 500 MG tablet Take 1,000 mg by mouth every 6 (six) hours as needed for mild pain.     apixaban (ELIQUIS) 2.5 MG TABS tablet Take 1 tablet (2.5 mg total) by mouth 2 (two) times daily. 60 tablet 6   clopidogrel (PLAVIX) 75 MG tablet Take 1 tablet by mouth once daily 30 tablet 0   econazole nitrate 1 % cream Apply topically daily. 15 g 0   EPINEPHrine (EPIPEN 2-PAK) 0.3 mg/0.3 mL IJ SOAJ injection Inject 0.3 mLs (0.3 mg total) into the muscle as needed for anaphylaxis. 1 each 0   fluticasone furoate-vilanterol (BREO ELLIPTA) 100-25 MCG/ACT AEPB Inhale 1 puff into the lungs daily. 60 each 0   folic acid (FOLVITE) 1 MG tablet Take 1 tablet (1 mg total) by mouth daily.     Ipratropium-Albuterol (COMBIVENT RESPIMAT) 20-100 MCG/ACT AERS respimat Inhale 1 puff into the lungs every 6 (six) hours as needed for wheezing or shortness of breath. 4 g 2   methadone (DOLOPHINE) 10 MG/ML solution Take 70 mg by mouth daily. Confirmed with [facility] 10/12/21     methocarbamol (ROBAXIN) 500 MG tablet Take 1 tablet (500 mg total) by mouth at bedtime as needed for muscle spasms. 20 tablet 0   ondansetron (ZOFRAN) 4 MG tablet Take 1 tablet (4 mg total) by mouth every 6 (six) hours. 12 tablet 0   tamsulosin (FLOMAX) 0.4 MG CAPS capsule Take 1 capsule by mouth at bedtime 30 capsule 1   [DISCONTINUED] sucralfate (CARAFATE) 1 GM/10ML suspension Take 10 mLs (1 g total) by mouth 4 (four) times daily -  with meals and at bedtime. (Patient not taking: Reported on 11/28/2019) 420 mL 0   No current  facility-administered medications on file prior to visit.    Allergies  Allergen Reactions   Aspirin Swelling    Lip swelling   Yellow Jacket Venom [Bee Venom] Anaphylaxis   Codeine Nausea Only   Ibuprofen     On blood thinners   Morphine And Related Itching    Social History   Socioeconomic History   Marital status: Widowed    Spouse name: Not on file   Number of children: 3   Years of education: Not on file   Highest education level: Not on file  Occupational History   Occupation: unemployed  Tobacco Use   Smoking status: Every Day    Packs/day: 1.00    Years: 47.00    Total pack years: 47.00    Types: Cigarettes   Smokeless tobacco: Never   Tobacco comments:    Cutting back>> Quit Smart card supplied  Building services engineer Use: Never used  Substance and Sexual Activity   Alcohol use: Not Currently    Comment: stopped years ago   Drug use: Not Currently    Types: Marijuana, Cocaine  Comment: occasional marijuana, last Cocaine use 2-3 weeks (as of 12/03/21)   Sexual activity: Yes    Birth control/protection: Condom  Other Topics Concern   Not on file  Social History Narrative   Not on file   Social Determinants of Health   Financial Resource Strain: Not on file  Food Insecurity: Not on file  Transportation Needs: Not on file  Physical Activity: Not on file  Stress: Not on file  Social Connections: Not on file  Intimate Partner Violence: Not on file    Family History  Problem Relation Age of Onset   Colon cancer Father    Asthma Father    Cancer Father        prostate   Hypertension Father    Asthma Mother    Hypertension Mother    Stomach cancer Neg Hx    Esophageal cancer Neg Hx     Past Surgical History:  Procedure Laterality Date   APPENDECTOMY     age 62 or 27   EXAMINATION UNDER ANESTHESIA  10/14/2011   Procedure: EXAM UNDER ANESTHESIA;  Surgeon: Velora Heckler, MD;  Location: [facility];  Service: General;  Laterality: N/A;  exam under  anethesia, Excision of mass anal canal, 1.5cm   INGUINAL HERNIA REPAIR  05/11/2012   Procedure: HERNIA REPAIR INGUINAL ADULT;  Surgeon: Velora Heckler, MD;  Location: [facility];  Service: General;  Laterality: Left;  left inguinal hernia repair with mesh   LOWER EXTREMITY ANGIOGRAPHY Left 07/02/2021   Procedure: LOWER EXTREMITY ANGIOGRAPHY;  Surgeon: Renford Dills, MD;  Location: [facility];  Service: Cardiovascular;  Laterality: Left;   LOWER EXTREMITY ANGIOGRAPHY Right 07/16/2021   Procedure: LOWER EXTREMITY ANGIOGRAPHY;  Surgeon: Renford Dills, MD;  Location: [facility];  Service: Cardiovascular;  Laterality: Right;   LOWER EXTREMITY ANGIOGRAPHY Left 08/27/2021   Procedure: LOWER EXTREMITY ANGIOGRAPHY;  Surgeon: Renford Dills, MD;  Location: [facility];  Service: Cardiovascular;  Laterality: Left;   LOWER EXTREMITY ANGIOGRAPHY Left 01/14/2022   Procedure: Lower Extremity Angiography;  Surgeon: Renford Dills, MD;  Location: [facility];  Service: Cardiovascular;  Laterality: Left;   SMALL INTESTINE SURGERY  04/19/2017   exploratory lap, partial small bowel resection for perforated ulcer/abcess, radiation enteritis   surgical pathology   10/14/2011   squamous cell ca of anus   TEE WITHOUT CARDIOVERSION N/A 10/16/2021   Procedure: TRANSESOPHAGEAL ECHOCARDIOGRAM (TEE);  Surgeon: Chrystie Nose, MD;  Location: [facility];  Service: Cardiovascular;  Laterality: N/A;   TOOTH EXTRACTION N/A 12/04/2021   Procedure: DENTAL RESTORATION/EXTRACTIONS;  Surgeon: Ocie Doyne, DMD;  Location: [facility];  Service: Oral Surgery;  Laterality: N/A;   transanal excision  10/14/2011   Dr.Todd Gerkin    ROS: Review of Systems Negative except as stated above  PHYSICAL EXAM: There were no vitals taken for this visit.  Physical Exam  {male adult master:310786} {male adult master:310785}     Latest Ref Rng & Units 04/19/2022     2:51 PM 03/02/2022   11:36 AM 01/14/2022   11:40 AM  CMP  Glucose 70 - 99 mg/dL 91  89    BUN 8 - 23 mg/dL 15  11  14    Creatinine 0.61 - 1.24 mg/dL 1.61  0.96  0.45   Sodium 135 - 145 mmol/L 137  137    Potassium 3.5 - 5.1 mmol/L 4.4  4.5    Chloride 98 - 111  mmol/L 109  102    CO2 22 - 32 mmol/L 24  20    Calcium 8.9 - 10.3 mg/dL 8.9  16.1    Total Protein 6.5 - 8.1 g/dL 6.8     Total Bilirubin 0.3 - 1.2 mg/dL 0.6     Alkaline Phos 38 - 126 U/L 89     AST 15 - 41 U/L 20     ALT 0 - 44 U/L 17      Lipid Panel     Component Value Date/Time   CHOL 120 07/31/2021 0856   TRIG 137 07/31/2021 0856   HDL 35 (L) 07/31/2021 0856   CHOLHDL 3.4 07/31/2021 0856   LDLCALC 61 07/31/2021 0856    CBC    Component Value Date/Time   WBC 10.5 04/19/2022 1641   RBC 3.55 (L) 04/19/2022 1641   HGB 10.4 (L) 04/19/2022 1641   HGB 12.1 (L) 03/02/2022 1136   HGB 14.4 10/19/2014 1203   HCT 31.5 (L) 04/19/2022 1641   HCT 36.6 (L) 03/02/2022 1136   HCT 44.3 10/19/2014 1203   PLT 143 (L) 04/19/2022 1641   PLT 157 03/02/2022 1136   MCV 88.7 04/19/2022 1641   MCV 86 03/02/2022 1136   MCV 95.2 10/19/2014 1203   MCH 29.3 04/19/2022 1641   MCHC 33.0 04/19/2022 1641   RDW 15.6 (H) 04/19/2022 1641   RDW 13.9 03/02/2022 1136   RDW 13.4 10/19/2014 1203   LYMPHSABS 2.1 04/19/2022 1641   LYMPHSABS 1.7 10/19/2014 1203   MONOABS 0.6 04/19/2022 1641   MONOABS 0.4 10/19/2014 1203   EOSABS 0.3 04/19/2022 1641   EOSABS 0.1 10/19/2014 1203   BASOSABS 0.1 04/19/2022 1641   BASOSABS 0.1 10/19/2014 1203    ASSESSMENT AND PLAN:  There are no diagnoses linked to this encounter.   Patient was given the opportunity to ask questions.  Patient verbalized understanding of the plan and was able to repeat key elements of the plan. Patient was given clear instructions to go to Emergency Department or return to medical center if symptoms don't improve, worsen, or new problems develop.The patient verbalized  understanding.   No orders of the defined types were placed in this encounter.    Requested Prescriptions    No prescriptions requested or ordered in this encounter    No follow-ups on file.  Rema Fendt, NP

## 2022-04-22 ENCOUNTER — Encounter: Payer: Medicare HMO | Admitting: Family

## 2022-04-22 DIAGNOSIS — R040 Epistaxis: Secondary | ICD-10-CM

## 2022-04-25 ENCOUNTER — Ambulatory Visit
Admission: EM | Admit: 2022-04-25 | Discharge: 2022-04-25 | Disposition: A | Discharge status: Home / Self Care | Payer: Medicare HMO | Attending: Physician Assistant | Admitting: Physician Assistant

## 2022-04-25 DIAGNOSIS — L03115 Cellulitis of right lower limb: Secondary | ICD-10-CM

## 2022-04-25 MED ORDER — SULFAMETHOXAZOLE-TRIMETHOPRIM 800-160 MG PO TABS: 1.0000 | ORAL_TABLET | Freq: Two times a day (BID) | ORAL | 0 refills | Status: DC

## 2022-04-25 MED ORDER — DOXYCYCLINE HYCLATE 100 MG PO CAPS
100.0000 mg | ORAL_CAPSULE | Freq: Two times a day (BID) | ORAL | 0 refills | Status: DC
Start: 2022-04-25 — End: 2022-04-25

## 2022-04-25 NOTE — ED Triage Notes (Signed)
Pt presents with right knee injury from bumping it up against a piece of sheet metal a few days ago; area is now red and swollen.

## 2022-04-25 NOTE — ED Provider Notes (Signed)
[name]    CSN: [id number] Arrival date & time: 04/25/22  1447      History   Chief Complaint Chief Complaint  Patient presents with   Knee Injury    HPI Martin [name] is a 61 y.o. male.   Patient here today for evaluation of right knee swelling and redness that developed after he injured his knee a few days ago. He reports that he accidentally hit his knee against sheet metal and sustained a superficial laceration that seemed to heal well. He notes that after this he developed a small bump to his upper knee and he "popped" this as he thought is was a "whitehead". He reports since then he has had more swelling and erythema develop. Movement worsens symptoms. He has a stent to his medial right upper leg- he notes he has had some pain radiate into his right thigh where stent is located. He has not had fever. He does not report treatment for current symptoms.  The history is provided by the patient.    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   Peripheral vascular disease ([place])    Pneumonia    as child, cough at present with no fever   Pneumonia    as child, cough at present with no fever    Radiation 11/19/11-01/08/12   5040 cGy 28 fx Pelvis and inguinal area   Stroke ([place])    no weaknes or paralysis   Substance abuse ([place])     Patient Active Problem List    Diagnosis Date Noted   Sepsis 10/12/2021   Peripheral vascular disease    AKI    Tobacco use    Emphysema    BPH     Periodontal disease    IVDU, cocaine use    Demand ischemia    Thrombocytopenia    Normocytic anemia    MSSA bacteremia 10/11/2021   Callus 06/12/2021   Hav (hallux abducto valgus), unspecified laterality 06/12/2021   Hammer toes, bilateral 06/12/2021   Atherosclerosis of native arteries of extremity with intermittent claudication ([place]) 06/12/2021   Hyperlipidemia 06/07/2021   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   Elevated blood-pressure reading, without diagnosis of hypertension 09/26/2020   Body mass index (BMI) 29.0-29.9, adult 08/29/2020   Partial small bowel obstruction ([place]) 03/10/2020   Small bowel obstruction due to adhesions ([place]) 03/08/2020   Enteritis 10/15/2019   Abnormal liver function 10/15/2019   Leukocytosis 10/15/2019   Nausea & vomiting 10/15/2019   GAD (generalized anxiety disorder) 05/18/2017   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   Constipation 04/11/2017   Bee sting 04/05/2017   Diverticulosis 11/29/2014   Healthcare maintenance 11/06/2014   Cough 10/05/2014   Insomnia 09/13/2014   Erectile dysfunction 11/30/2013   Chronic pain  syndrome 11/30/2013   Depression 11/30/2013   GERD (gastroesophageal reflux disease)    Hypertension    Anxiety    Arthritis    Osteoarthritis of lumbar spine    Radiation    Night sweats 08/12/2012   Tobacco abuse counseling 05/26/2012   Lymphocytic colitis 05/03/2012   Vitamin B12 deficiency 03/19/2012   Folate deficiency 03/19/2012   Inguinal hernia 11/24/2011   Cancer ([place]) 10/14/2011   Internal hemorrhoid 09/11/2011   Anal cancer 10/09/2010    Past Surgical History:  Procedure Laterality Date   APPENDECTOMY     age 30 or 5   EXAMINATION UNDER ANESTHESIA  10/14/2011   Procedure: EXAM UNDER ANESTHESIA;  Surgeon: Earnstine Regal, MD;  Location: [facility];  Service: General;  Laterality:  N/A;  exam under anethesia, Excision of mass anal canal, 1.5cm   INGUINAL HERNIA REPAIR  05/11/2012   Procedure: HERNIA REPAIR INGUINAL ADULT;  Surgeon: Earnstine Regal, MD;  Location: [place];  Service: General;  Laterality: Left;  left inguinal hernia repair with mesh   LOWER EXTREMITY ANGIOGRAPHY Left 07/02/2021   Procedure: LOWER EXTREMITY ANGIOGRAPHY;  Surgeon: Katha Cabal, MD;  Location: [place] [facility];  Service: Cardiovascular;  Laterality: Left;   LOWER EXTREMITY ANGIOGRAPHY Right 07/16/2021   Procedure: LOWER EXTREMITY ANGIOGRAPHY;  Surgeon: Katha Cabal, MD;  Location: [place] [facility];  Service: Cardiovascular;  Laterality: Right;   LOWER EXTREMITY ANGIOGRAPHY Left 08/27/2021   Procedure: LOWER EXTREMITY ANGIOGRAPHY;  Surgeon: Katha Cabal, MD;  Location: [place] [facility];  Service: Cardiovascular;  Laterality: Left;   LOWER EXTREMITY ANGIOGRAPHY Left 01/14/2022   Procedure: Lower Extremity Angiography;  Surgeon: Katha Cabal, MD;  Location: [place] [facility];  Service: Cardiovascular;  Laterality: Left;   SMALL INTESTINE SURGERY  04/19/2017   exploratory lap, partial small bowel resection for perforated ulcer/abcess, radiation enteritis   surgical pathology   10/14/2011   squamous cell ca of anus   TEE WITHOUT CARDIOVERSION N/A 10/16/2021   Procedure: TRANSESOPHAGEAL ECHOCARDIOGRAM (TEE);  Surgeon: [name], MD;  Location: [facility];  Service: Cardiovascular;  Laterality: N/A;   TOOTH EXTRACTION N/A 12/04/2021   Procedure: DENTAL RESTORATION/EXTRACTIONS;  Surgeon: Diona Browner, DMD;  Location: [place];  Service: Oral Surgery;  Laterality: N/A;   transanal excision  10/14/2011   Dr.Todd Gerkin       Home Medications    Prior to Admission medications   Medication Sig Start Date End Date Taking? Authorizing Provider  doxycycline (VIBRAMYCIN) 100 MG capsule Take 1 capsule (100 mg total) by mouth 2 (two) times  daily. 04/25/22  Yes Francene Finders, PA-C  acetaminophen (TYLENOL) 500 MG tablet Take 1,000 mg by mouth every 6 (six) hours as needed for mild pain.    [provider]  apixaban (ELIQUIS) 2.5 MG TABS tablet Take 1 tablet (2.5 mg total) by mouth 2 (two) times daily. 01/14/22   Schnier, Dolores Lory, MD  clopidogrel (PLAVIX) 75 MG tablet Take 1 tablet by mouth once daily 02/28/22   Schnier, Dolores Lory, MD  econazole nitrate 1 % cream Apply topically daily. 01/17/22   Nyoka Lint, PA-C  EPINEPHrine (EPIPEN 2-PAK) 0.3 mg/0.3 mL IJ SOAJ injection Inject 0.3 mLs (0.3 mg total) into the muscle as needed for anaphylaxis. 02/23/20   Long, Wonda Olds, MD  fluticasone furoate-vilanterol (BREO ELLIPTA) 100-25 MCG/ACT AEPB Inhale 1 puff into the lungs daily. 01/22/22   Geryl Councilman L, PA  folic  acid (FOLVITE) 1 MG tablet Take 1 tablet (1 mg total) by mouth daily. 10/18/21   Elgergawy, Silver Huguenin, MD  Ipratropium-Albuterol (COMBIVENT RESPIMAT) 20-100 MCG/ACT AERS respimat Inhale 1 puff into the lungs every 6 (six) hours as needed for wheezing or shortness of breath. 01/22/22   Crain, Whitney L, PA  methadone (DOLOPHINE) 10 MG/ML solution Take 70 mg by mouth daily. Confirmed with [facility] 10/12/21    [provider]  methocarbamol (ROBAXIN) 500 MG tablet Take 1 tablet (500 mg total) by mouth at bedtime as needed for muscle spasms. 03/14/22   Lamptey, Myrene Galas, MD  ondansetron (ZOFRAN) 4 MG tablet Take 1 tablet (4 mg total) by mouth every 6 (six) hours. 03/02/22   Ward, Lenise Arena, PA-C  tamsulosin (FLOMAX) 0.4 MG CAPS capsule Take 1 capsule by mouth at bedtime 02/11/22   Camillia Herter, NP  sucralfate (CARAFATE) 1 GM/10ML suspension Take 10 mLs (1 g total) by mouth 4 (four) times daily -  with meals and at bedtime. Patient not taking: Reported on 11/28/2019 10/21/19 06/13/20  Randal Buba, April, MD    Family History Family History  Problem Relation Age of Onset   Colon cancer Father    Asthma Father     Cancer Father        prostate   Hypertension Father    Asthma Mother    Hypertension Mother    Stomach cancer Neg Hx    Esophageal cancer Neg Hx     Social History Social History   Tobacco Use   Smoking status: Every Day    Packs/day: 1.00    Years: 47.00    Total pack years: 47.00    Types: Cigarettes   Smokeless tobacco: Never   Tobacco comments:    Cutting back>> Quit Smart card supplied  Vaping Use   Vaping Use: Never used  Substance Use Topics   Alcohol use: Not Currently    Comment: stopped years ago   Drug use: Not Currently    Types: Marijuana, Cocaine    Comment: occasional marijuana, last Cocaine use 2-3 weeks (as of 12/03/21)     Allergies   Aspirin, Yellow jacket venom [bee venom], Codeine, Ibuprofen, and Morphine and related   Review of Systems Review of Systems  Constitutional:  Negative for chills and fever.  Eyes:  Negative for discharge and redness.  Musculoskeletal:  Positive for arthralgias and joint swelling.  Skin:  Positive for color change. Negative for wound.  Neurological:  Negative for numbness.     Physical Exam Triage Vital Signs ED Triage Vitals  Enc Vitals Group     BP      Pulse      Resp      Temp      Temp src      SpO2      Weight      Height      Head Circumference      Peak Flow      Pain Score      Pain Loc      Pain Edu?      Excl. in [place]?    No data found.  Updated Vital Signs BP (!) 130/54 (BP Location: Left Arm)   Pulse 68   Temp 98.2 F (36.8 C) (Oral)   Resp 17   SpO2 97%      Physical Exam Vitals and nursing note reviewed.  Constitutional:      General: He is not in acute  distress.    Appearance: Normal appearance. He is not ill-appearing.  HENT:     Head: Normocephalic and atraumatic.  Eyes:     Conjunctiva/sclera: Conjunctivae normal.  Cardiovascular:     Rate and Rhythm: Normal rate.  Pulmonary:     Effort: Pulmonary effort is normal.  Musculoskeletal:     Comments: Right knee with  diffuse anterior swelling and erythema, healed superficial laceration, no active bleeding or drainage.  Neurological:     Mental Status: He is alert.  Psychiatric:        Mood and Affect: Mood normal.        Behavior: Behavior normal.        Thought Content: Thought content normal.      UC Treatments / Results  Labs (all labs ordered are listed, but only abnormal results are displayed) Labs Reviewed - No data to display  EKG   Radiology No results found.  Procedures Procedures (including critical care time)  Medications Ordered in UC Medications - No data to display  Initial Impression / Assessment and Plan / UC Course  I have reviewed the triage vital signs and the nursing notes.  Pertinent labs & imaging results that were available during my care of the patient were reviewed by me and considered in my medical decision making (see chart for details).    Will start doxycycline for suspected cellulitis with very strict precaution to report to ED if no improvement or with any worsening as he may need imaging. Patient expresses understanding.   Final Clinical Impressions(s) / UC Diagnoses   Final diagnoses:  Cellulitis of right lower extremity     Discharge Instructions       Please report to ED with any worsening symptoms.     ED Prescriptions     Medication Sig Dispense Auth. Provider   doxycycline (VIBRAMYCIN) 100 MG capsule Take 1 capsule (100 mg total) by mouth 2 (two) times daily. 20 capsule Francene Finders, PA-C      PDMP not reviewed this encounter.   Francene Finders, PA-C 04/25/22 1528

## 2022-04-25 NOTE — Discharge Instructions (Addendum)
  Please report to ED with any worsening symptoms.

## 2022-04-28 ENCOUNTER — Telehealth (INDEPENDENT_AMBULATORY_CARE_PROVIDER_SITE_OTHER): Payer: Self-pay

## 2022-04-28 ENCOUNTER — Inpatient Hospital Stay (HOSPITAL_COMMUNITY)
Admission: EM | Admit: 2022-04-28 | Discharge: 2022-05-01 | DRG: 602 | Disposition: A | Discharge status: Home / Self Care | Payer: Medicare HMO | Attending: Internal Medicine | Admitting: Internal Medicine

## 2022-04-28 ENCOUNTER — Inpatient Hospital Stay (HOSPITAL_COMMUNITY): Payer: Medicare HMO

## 2022-04-28 ENCOUNTER — Other Ambulatory Visit: Payer: Self-pay

## 2022-04-28 ENCOUNTER — Emergency Department (HOSPITAL_COMMUNITY): Payer: Medicare HMO

## 2022-04-28 ENCOUNTER — Encounter (HOSPITAL_COMMUNITY): Payer: Self-pay | Admitting: Emergency Medicine

## 2022-04-28 DIAGNOSIS — Z681 Body mass index (BMI) 19 or less, adult: Secondary | ICD-10-CM | POA: Diagnosis not present

## 2022-04-28 DIAGNOSIS — Z720 Tobacco use: Secondary | ICD-10-CM | POA: Diagnosis not present

## 2022-04-28 DIAGNOSIS — J439 Emphysema, unspecified: Secondary | ICD-10-CM | POA: Diagnosis not present

## 2022-04-28 DIAGNOSIS — Z8673 Personal history of transient ischemic attack (TIA), and cerebral infarction without residual deficits: Secondary | ICD-10-CM

## 2022-04-28 DIAGNOSIS — L0291 Cutaneous abscess, unspecified: Secondary | ICD-10-CM | POA: Diagnosis not present

## 2022-04-28 DIAGNOSIS — L02224 Furuncle of groin: Secondary | ICD-10-CM | POA: Diagnosis not present

## 2022-04-28 DIAGNOSIS — K219 Gastro-esophageal reflux disease without esophagitis: Secondary | ICD-10-CM | POA: Diagnosis present

## 2022-04-28 DIAGNOSIS — Z9221 Personal history of antineoplastic chemotherapy: Secondary | ICD-10-CM

## 2022-04-28 DIAGNOSIS — Z7951 Long term (current) use of inhaled steroids: Secondary | ICD-10-CM | POA: Diagnosis not present

## 2022-04-28 DIAGNOSIS — G894 Chronic pain syndrome: Secondary | ICD-10-CM | POA: Diagnosis not present

## 2022-04-28 DIAGNOSIS — D696 Thrombocytopenia, unspecified: Secondary | ICD-10-CM | POA: Diagnosis not present

## 2022-04-28 DIAGNOSIS — Z800 Family history of malignant neoplasm of digestive organs: Secondary | ICD-10-CM | POA: Diagnosis not present

## 2022-04-28 DIAGNOSIS — L02415 Cutaneous abscess of right lower limb: Secondary | ICD-10-CM | POA: Diagnosis not present

## 2022-04-28 DIAGNOSIS — R600 Localized edema: Secondary | ICD-10-CM | POA: Diagnosis not present

## 2022-04-28 DIAGNOSIS — J449 Chronic obstructive pulmonary disease, unspecified: Secondary | ICD-10-CM | POA: Diagnosis present

## 2022-04-28 DIAGNOSIS — M25561 Pain in right knee: Secondary | ICD-10-CM | POA: Diagnosis not present

## 2022-04-28 DIAGNOSIS — E538 Deficiency of other specified B group vitamins: Secondary | ICD-10-CM | POA: Diagnosis present

## 2022-04-28 DIAGNOSIS — Z7901 Long term (current) use of anticoagulants: Secondary | ICD-10-CM

## 2022-04-28 DIAGNOSIS — N400 Enlarged prostate without lower urinary tract symptoms: Secondary | ICD-10-CM | POA: Diagnosis present

## 2022-04-28 DIAGNOSIS — I739 Peripheral vascular disease, unspecified: Secondary | ICD-10-CM | POA: Diagnosis present

## 2022-04-28 DIAGNOSIS — D638 Anemia in other chronic diseases classified elsewhere: Secondary | ICD-10-CM | POA: Diagnosis not present

## 2022-04-28 DIAGNOSIS — Z825 Family history of asthma and other chronic lower respiratory diseases: Secondary | ICD-10-CM | POA: Diagnosis not present

## 2022-04-28 DIAGNOSIS — N4821 Abscess of corpus cavernosum and penis: Secondary | ICD-10-CM | POA: Diagnosis present

## 2022-04-28 DIAGNOSIS — F17210 Nicotine dependence, cigarettes, uncomplicated: Secondary | ICD-10-CM | POA: Diagnosis present

## 2022-04-28 DIAGNOSIS — F419 Anxiety disorder, unspecified: Secondary | ICD-10-CM | POA: Diagnosis present

## 2022-04-28 DIAGNOSIS — Z79899 Other long term (current) drug therapy: Secondary | ICD-10-CM

## 2022-04-28 DIAGNOSIS — Z923 Personal history of irradiation: Secondary | ICD-10-CM | POA: Diagnosis not present

## 2022-04-28 DIAGNOSIS — E43 Unspecified severe protein-calorie malnutrition: Secondary | ICD-10-CM | POA: Diagnosis not present

## 2022-04-28 DIAGNOSIS — N433 Hydrocele, unspecified: Secondary | ICD-10-CM | POA: Diagnosis not present

## 2022-04-28 DIAGNOSIS — M533 Sacrococcygeal disorders, not elsewhere classified: Secondary | ICD-10-CM | POA: Diagnosis not present

## 2022-04-28 DIAGNOSIS — L03115 Cellulitis of right lower limb: Principal | ICD-10-CM

## 2022-04-28 DIAGNOSIS — M1611 Unilateral primary osteoarthritis, right hip: Secondary | ICD-10-CM | POA: Diagnosis not present

## 2022-04-28 DIAGNOSIS — Z8249 Family history of ischemic heart disease and other diseases of the circulatory system: Secondary | ICD-10-CM | POA: Diagnosis not present

## 2022-04-28 DIAGNOSIS — D649 Anemia, unspecified: Secondary | ICD-10-CM | POA: Diagnosis present

## 2022-04-28 DIAGNOSIS — Z85048 Personal history of other malignant neoplasm of rectum, rectosigmoid junction, and anus: Secondary | ICD-10-CM

## 2022-04-28 DIAGNOSIS — S81011A Laceration without foreign body, right knee, initial encounter: Secondary | ICD-10-CM | POA: Diagnosis not present

## 2022-04-28 DIAGNOSIS — Z872 Personal history of diseases of the skin and subcutaneous tissue: Secondary | ICD-10-CM | POA: Diagnosis not present

## 2022-04-28 DIAGNOSIS — I10 Essential (primary) hypertension: Secondary | ICD-10-CM | POA: Diagnosis not present

## 2022-04-28 DIAGNOSIS — Z7902 Long term (current) use of antithrombotics/antiplatelets: Secondary | ICD-10-CM

## 2022-04-28 DIAGNOSIS — F1120 Opioid dependence, uncomplicated: Secondary | ICD-10-CM | POA: Diagnosis not present

## 2022-04-28 DIAGNOSIS — Z9049 Acquired absence of other specified parts of digestive tract: Secondary | ICD-10-CM

## 2022-04-28 LAB — COMPREHENSIVE METABOLIC PANEL
ALT: 17 U/L (ref 0–44)
AST: 19 U/L (ref 15–41)
Albumin: 3.6 g/dL (ref 3.5–5.0)
Alkaline Phosphatase: 93 U/L (ref 38–126)
Anion gap: 4 — ABNORMAL LOW (ref 5–15)
BUN: 13 mg/dL (ref 8–23)
CO2: 21 mmol/L — ABNORMAL LOW (ref 22–32)
Calcium: 8.6 mg/dL — ABNORMAL LOW (ref 8.9–10.3)
Chloride: 107 mmol/L (ref 98–111)
Creatinine, Ser: 1.02 mg/dL (ref 0.61–1.24)
GFR, Estimated: >60 mL/min (ref >60)
Glucose, Bld: 88 mg/dL (ref 70–99)
Potassium: 4.6 mmol/L (ref 3.5–5.1)
Sodium: 132 mmol/L — ABNORMAL LOW (ref 135–145)
Total Bilirubin: <0.1 mg/dL — ABNORMAL LOW (ref 0.3–1.2)
Total Protein: 6.9 g/dL (ref 6.5–8.1)

## 2022-04-28 LAB — CBC WITH DIFFERENTIAL/PLATELET
Abs Immature Granulocytes: 0.04 10*3/uL (ref 0.00–0.07)
Basophils Absolute: 0.1 10*3/uL (ref 0.0–0.1)
Basophils Relative: 1 %
Eosinophils Absolute: 0.1 10*3/uL (ref 0.0–0.5)
Eosinophils Relative: 1 %
HCT: 29.5 % — ABNORMAL LOW (ref 39.0–52.0)
Hemoglobin: 9.6 g/dL — ABNORMAL LOW (ref 13.0–17.0)
Immature Granulocytes: 0 %
Lymphocytes Relative: 16 %
Lymphs Abs: 1.8 10*3/uL (ref 0.7–4.0)
MCH: 29.2 pg (ref 26.0–34.0)
MCHC: 32.5 g/dL (ref 30.0–36.0)
MCV: 89.7 fL (ref 80.0–100.0)
Monocytes Absolute: 0.7 10*3/uL (ref 0.1–1.0)
Monocytes Relative: 6 %
Neutro Abs: 8.9 10*3/uL — ABNORMAL HIGH (ref 1.7–7.7)
Neutrophils Relative %: 76 %
Platelets: 168 10*3/uL (ref 150–400)
RBC: 3.29 MIL/uL — ABNORMAL LOW (ref 4.22–5.81)
RDW: 15.9 % — ABNORMAL HIGH (ref 11.5–15.5)
WBC: 11.7 10*3/uL — ABNORMAL HIGH (ref 4.0–10.5)
nRBC: 0 % (ref 0.0–0.2)

## 2022-04-28 LAB — LACTIC ACID, PLASMA: Lactic Acid, Venous: 0.9 mmol/L (ref 0.5–1.9)

## 2022-04-28 MED ORDER — IOHEXOL 300 MG/ML  SOLN
100.0000 mL | Freq: Once | INTRAMUSCULAR | Status: AC | PRN
  Administered 2022-04-28: 100 mL via INTRAVENOUS

## 2022-04-28 MED ORDER — IPRATROPIUM-ALBUTEROL 20-100 MCG/ACT IN AERS: 1.0000 | INHALATION_SPRAY | Freq: Four times a day (QID) | RESPIRATORY_TRACT | Status: DC | PRN

## 2022-04-28 MED ORDER — GADOBUTROL 1 MMOL/ML IV SOLN
6.5000 mL | Freq: Once | INTRAVENOUS | Status: AC | PRN
  Administered 2022-04-28: 6.5 mL via INTRAVENOUS

## 2022-04-28 MED ORDER — ACETAMINOPHEN 650 MG RE SUPP: 650.0000 mg | Freq: Four times a day (QID) | RECTAL | Status: DC | PRN

## 2022-04-28 MED ORDER — ONDANSETRON HCL 4 MG/2ML IJ SOLN: 4.0000 mg | Freq: Four times a day (QID) | INTRAMUSCULAR | Status: DC | PRN

## 2022-04-28 MED ORDER — IPRATROPIUM-ALBUTEROL 0.5-2.5 (3) MG/3ML IN SOLN: 3.0000 mL | Freq: Four times a day (QID) | RESPIRATORY_TRACT | Status: DC | PRN

## 2022-04-28 MED ORDER — ONDANSETRON HCL 4 MG PO TABS: 4.0000 mg | ORAL_TABLET | Freq: Four times a day (QID) | ORAL | Status: DC | PRN

## 2022-04-28 MED ORDER — TAMSULOSIN HCL 0.4 MG PO CAPS
0.4000 mg | ORAL_CAPSULE | Freq: Every day | ORAL | Status: DC
  Administered 2022-04-28 – 2022-04-30 (×3): 0.4 mg via ORAL
  Filled 2022-04-28 (×3): qty 1

## 2022-04-28 MED ORDER — OXYCODONE-ACETAMINOPHEN 5-325 MG PO TABS
1.0000 | ORAL_TABLET | Freq: Once | ORAL | Status: AC
  Administered 2022-04-28: 1 via ORAL
  Filled 2022-04-28: qty 1

## 2022-04-28 MED ORDER — VANCOMYCIN HCL 1250 MG/250ML IV SOLN
1250.0000 mg | INTRAVENOUS | Status: DC
  Administered 2022-04-29 – 2022-04-30 (×2): 1250 mg via INTRAVENOUS
  Filled 2022-04-28 (×2): qty 250

## 2022-04-28 MED ORDER — ACETAMINOPHEN 325 MG PO TABS: 650.0000 mg | ORAL_TABLET | Freq: Four times a day (QID) | ORAL | Status: DC | PRN

## 2022-04-28 MED ORDER — SODIUM CHLORIDE 0.9 % IV SOLN
1.0000 g | INTRAVENOUS | Status: DC
  Administered 2022-04-29 – 2022-04-30 (×3): 1 g via INTRAVENOUS
  Filled 2022-04-28 (×3): qty 10

## 2022-04-28 MED ORDER — HYDROCODONE-ACETAMINOPHEN 5-325 MG PO TABS
1.0000 | ORAL_TABLET | ORAL | Status: DC | PRN
  Administered 2022-04-28 – 2022-05-01 (×11): 1 via ORAL
  Filled 2022-04-28 (×11): qty 1

## 2022-04-28 MED ORDER — VANCOMYCIN HCL IN DEXTROSE 1-5 GM/200ML-% IV SOLN
1000.0000 mg | Freq: Once | INTRAVENOUS | Status: AC
  Administered 2022-04-28: 1000 mg via INTRAVENOUS
  Filled 2022-04-28: qty 200

## 2022-04-28 MED ORDER — FLUTICASONE FUROATE-VILANTEROL 100-25 MCG/ACT IN AEPB
1.0000 | INHALATION_SPRAY | Freq: Every day | RESPIRATORY_TRACT | Status: DC
  Administered 2022-04-29 – 2022-05-01 (×3): 1 via RESPIRATORY_TRACT
  Filled 2022-04-28: qty 28

## 2022-04-28 MED ORDER — SENNOSIDES-DOCUSATE SODIUM 8.6-50 MG PO TABS: 1.0000 | ORAL_TABLET | Freq: Every evening | ORAL | Status: DC | PRN

## 2022-04-28 MED ORDER — LIDOCAINE-EPINEPHRINE (PF) 2 %-1:200000 IJ SOLN
20.0000 mL | Freq: Once | INTRAMUSCULAR | Status: AC
  Administered 2022-04-28: 20 mL
  Filled 2022-04-28: qty 20

## 2022-04-28 MED ORDER — NICOTINE 21 MG/24HR TD PT24
21.0000 mg | MEDICATED_PATCH | Freq: Every day | TRANSDERMAL | Status: DC
  Administered 2022-04-28 – 2022-05-01 (×4): 21 mg via TRANSDERMAL
  Filled 2022-04-28 (×4): qty 1

## 2022-04-28 MED ORDER — METHADONE HCL 10 MG/ML PO CONC
85.0000 mg | Freq: Every day | ORAL | Status: DC
  Administered 2022-04-29 – 2022-05-01 (×3): 85 mg via ORAL
  Filled 2022-04-28 (×3): qty 10

## 2022-04-28 NOTE — ED Triage Notes (Signed)
Patient c/o R knee pain, redness and swelling. He reports hitting his knee on a metal sheet. Reports being seen at [facility] and prescribed amoxicillin, which he has been taking for 4 days w/ no relief. He reports having lower extremity stents which have been sore since incident. Hx cancer, PVD, CVA. Denies fever.

## 2022-04-28 NOTE — H&P (Signed)
History and Physical    Martin [name] LFY:[id number] DOB: 1960/09/24 DOA: 04/28/2022  PCP: Camillia Herter, NP  Patient coming from: Home  I have personally briefly reviewed patient's old medical records in [place]  Chief Complaint: Right knee pain and swelling  HPI: Martin [name] is a 61 y.o. male with medical history significant for COPD, PAD s/p LLE angioplasty and stenting (left superficial femoral and popliteal arteries, left anterior tibial) 01/14/2022 on Eliquis and Plavix, history of CVA, anemia, history of anal cancer (s/p excision, chemotherapy, radiation), chronic pain on methadone, tobacco use who presented to the ED for evaluation of right knee pain and swelling.  Patient states that he cut his right knee on a piece of sheet metal about 1 week ago.  He developed a small pustular head and he "popped" the area as he thought it was a whitehead.  He subsequently developed more swelling and erythema as well as pain limiting his range of motion.  He went to urgent care on 8/18 and was given a prescription for doxycycline.  Despite antibiotics he has had worsening erythema, swelling, pain.  He denies any subjective fevers, chills, diaphoresis.  He also notes pruritus and induration in his upper medial thigh/groin area.  He says this is in the area at his old radiation site from prior cancer.  ED Course  Labs/Imaging on admission: I have personally reviewed following labs and imaging studies.  Initial vitals showed BP 122/58, pulse 68, RR 18, temp 98.2 F, SPO2 100% on room air.  Labs show WBC 11.7, hemoglobin 9.6, platelets 168,000, sodium 132, potassium 4.6, bicarb 21, BUN 13, creatinine 1.02, serum glucose 88, lactic acid 0.9.  CT of the right femur, knee, tibia/fibula with contrast showed skin thickening and focal soft tissue edema about the posterior/medial aspect of the thigh at the level gluteus maximus measuring approximately 4.6 x 1.8 x 1.4 cm suggesting mild  cellulitis.  No definite drainable fluid collection or abscess.  Mild generalized subcutaneous soft tissue edema about the knee and lateral aspect of the leg also seen without fluid collection or abscess.  No acute osseous abnormality, no intramuscular hematoma or fluid collection.  Tendons intact.  EDP performed I&D at the right knee with purulent drainage noted.  Patient was given IV vancomycin.  EDP discussed with on-call orthopedics, Dr. Doran Durand, who recommended further imaging with MRI and can be consulted in the morning to review images and evaluate patient.  The hospitalist service was consulted to admit for further evaluation and management.  Review of Systems: All systems reviewed and are negative except as documented in history of present illness above.   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   Peripheral vascular disease (HCC)    Pneumonia  as child, cough at present with no fever   Pneumonia    as child, cough at present with no fever    Radiation 11/19/11-01/08/12   5040 cGy 28 fx Pelvis and inguinal area   Stroke ([place])    no weaknes or paralysis   Substance abuse [facility])     Past Surgical History:  Procedure Laterality Date   APPENDECTOMY     age 80 or 75   EXAMINATION UNDER ANESTHESIA  10/14/2011   Procedure: EXAM UNDER ANESTHESIA;  Surgeon: Earnstine Regal, MD;  Location: [facility]  ORS;  Service: General;  Laterality: N/A;  exam under anethesia, Excision of mass anal canal, 1.5cm   INGUINAL HERNIA REPAIR  05/11/2012   Procedure: HERNIA REPAIR INGUINAL ADULT;  Surgeon: Earnstine Regal, MD;  Location: [place];  Service: General;  Laterality: Left;  left inguinal hernia repair with mesh   LOWER EXTREMITY ANGIOGRAPHY Left 07/02/2021   Procedure: LOWER EXTREMITY ANGIOGRAPHY;  Surgeon: Katha Cabal, MD;  Location: [place] CV [facility];  Service: Cardiovascular;  Laterality: Left;   LOWER EXTREMITY ANGIOGRAPHY Right 07/16/2021   Procedure: LOWER EXTREMITY ANGIOGRAPHY;  Surgeon: Katha Cabal, MD;  Location: [place] [facility];  Service: Cardiovascular;  Laterality: Right;   LOWER EXTREMITY ANGIOGRAPHY Left 08/27/2021   Procedure: LOWER EXTREMITY ANGIOGRAPHY;  Surgeon: Katha Cabal, MD;  Location: [place] [facility];  Service: Cardiovascular;  Laterality: Left;   LOWER EXTREMITY ANGIOGRAPHY Left 01/14/2022   Procedure: Lower Extremity Angiography;  Surgeon: Katha Cabal, MD;  Location: [place] [facility];  Service: Cardiovascular;  Laterality: Left;   SMALL INTESTINE SURGERY  04/19/2017   exploratory lap, partial small bowel resection for perforated ulcer/abcess, radiation enteritis   surgical pathology   10/14/2011   squamous cell ca of anus   TEE WITHOUT CARDIOVERSION N/A 10/16/2021   Procedure: TRANSESOPHAGEAL ECHOCARDIOGRAM (TEE);  Surgeon: [name], MD;  Location: [facility] ENDOSCOPY;  Service: Cardiovascular;  Laterality: N/A;   TOOTH EXTRACTION N/A 12/04/2021   Procedure: DENTAL RESTORATION/EXTRACTIONS;  Surgeon: Diona Browner, DMD;  Location: [place];  Service: Oral Surgery;  Laterality: N/A;   transanal excision  10/14/2011   Dr.Todd Gerkin    Social History:  reports that he has been smoking cigarettes. He has a 47.00 pack-year smoking history. He has never used smokeless tobacco. He reports that he does not currently use  alcohol. He reports that he does not currently use drugs after having used the following drugs: Marijuana and Cocaine.  Allergies  Allergen Reactions   Aspirin Swelling    Lip swelling   Yellow Jacket Venom [Bee Venom] Anaphylaxis   Codeine Nausea Only   Ibuprofen     On blood thinners   Morphine And Related Itching    Family History  Problem Relation Age of Onset   Colon cancer Father    Asthma Father    Cancer Father        prostate   Hypertension Father    Asthma Mother    Hypertension Mother    Stomach cancer Neg Hx    Esophageal cancer Neg Hx      Prior to Admission medications   Medication Sig Start Date End Date Taking? Authorizing Provider  acetaminophen (TYLENOL) 500 MG tablet Take 1,000 mg by mouth every 6 (six) hours as needed for mild pain.   Yes [provider]  albuterol (VENTOLIN HFA) 108 (90 Base) MCG/ACT inhaler Inhale 2 puffs into the lungs every 6 (  six) hours as needed for wheezing or shortness of breath.   Yes [provider]  apixaban (ELIQUIS) 2.5 MG TABS tablet Take 1 tablet (2.5 mg total) by mouth 2 (two) times daily. 01/14/22  Yes Schnier, Dolores Lory, MD  clopidogrel (PLAVIX) 75 MG tablet Take 1 tablet by mouth once daily 02/28/22  Yes Schnier, Dolores Lory, MD  econazole nitrate 1 % cream Apply topically daily. Patient taking differently: Apply 1 Application topically daily as needed (rash). 01/17/22  Yes Nyoka Lint, PA-C  HYDROcodone-acetaminophen (NORCO/VICODIN) 5-325 MG tablet Take 1 tablet by mouth every 4 (four) hours as needed for moderate pain or severe pain. 04/15/22  Yes [provider]  methadone (DOLOPHINE) 10 MG/ML solution Take 85 mg by mouth daily. Confirmed with [facility] 10/12/21   Yes [provider]  sulfamethoxazole-trimethoprim (BACTRIM DS) 800-160 MG tablet Take 1 tablet by mouth 2 (two) times daily for 7 days. 04/25/22 05/02/22 Yes Francene Finders, PA-C  tamsulosin [facility]) 0.4 MG CAPS capsule  Take 1 capsule by mouth at bedtime 02/11/22  Yes Minette Brine, Amy J, NP  EPINEPHrine (EPIPEN 2-PAK) 0.3 mg/0.3 mL IJ SOAJ injection Inject 0.3 mLs (0.3 mg total) into the muscle as needed for anaphylaxis. 02/23/20   Long, Wonda Olds, MD  fluticasone furoate-vilanterol (BREO ELLIPTA) 100-25 MCG/ACT AEPB Inhale 1 puff into the lungs daily. Patient not taking: Reported on 04/28/2022 01/22/22   Geryl Councilman L, PA  folic acid (FOLVITE) 1 MG tablet Take 1 tablet (1 mg total) by mouth daily. Patient not taking: Reported on 04/28/2022 10/18/21   Elgergawy, Silver Huguenin, MD  Ipratropium-Albuterol (COMBIVENT RESPIMAT) 20-100 MCG/ACT AERS respimat Inhale 1 puff into the lungs every 6 (six) hours as needed for wheezing or shortness of breath. 01/22/22   Crain, Whitney L, PA  methocarbamol (ROBAXIN) 500 MG tablet Take 1 tablet (500 mg total) by mouth at bedtime as needed for muscle spasms. Patient not taking: Reported on 04/28/2022 03/14/22   Chase Picket, MD  ondansetron (ZOFRAN) 4 MG tablet Take 1 tablet (4 mg total) by mouth every 6 (six) hours. Patient not taking: Reported on 04/28/2022 03/02/22   Ward, Janett Billow Z, PA-C  sucralfate (CARAFATE) 1 GM/10ML suspension Take 10 mLs (1 g total) by mouth 4 (four) times daily -  with meals and at bedtime. Patient not taking: Reported on 11/28/2019 10/21/19 06/13/20  Veatrice Kells, MD    Physical Exam: Vitals:   04/28/22 1930 04/28/22 2000 04/28/22 2024 04/28/22 2131  BP: (!) 128/37 (!) 147/34    Pulse: (!) 56 76    Resp:      Temp:   98.6 F (37 C) 98.1 F (36.7 C)  TempSrc:   Oral Oral  SpO2: 100% 100%     Constitutional: Resting in bed with head elevated, NAD, calm, comfortable Eyes: EOMI, lids and conjunctivae normal ENMT: Mucous membranes are dry. Posterior pharynx clear of any exudate or lesions.Normal dentition.  Neck: normal, supple, no masses. Respiratory: Coarse wheezing bilateral lower lung fields. Normal respiratory effort. No accessory muscle use.   Cardiovascular: Regular rate and rhythm, no murmurs / rubs / gallops. No extremity edema. 2+ pedal pulses. Abdomen: no tenderness, no masses palpated.  Musculoskeletal: no clubbing / cyanosis. No joint deformity upper and lower extremities. No contractures. Normal muscle tone.  Skin: Wound wrapping in place over right knee s/p I&D.  See picture below for initial appearance.  Upper medial thigh/groin area with postradiation skin changes, slight induration and tenderness to palpation. Neurologic:  Sensation intact. Strength equal bilaterally. Psychiatric:  Alert and oriented x 3. Normal mood.     EKG: Not performed.  Assessment/Plan Principal Problem:   Cellulitis of right lower extremity Active Problems:   Peripheral vascular disease   COPD (chronic obstructive pulmonary disease) (HCC)   Chronic pain syndrome   Tobacco use   Normocytic anemia   Martin [name] is a 61 y.o. male with medical history significant for COPD, PAD s/p LLE angioplasty and stenting (left superficial femoral and popliteal arteries, left anterior tibial) 01/14/2022 on Eliquis and Plavix, history of CVA, anemia, history of anal cancer (s/p excision, chemotherapy, radiation), chronic pain on methadone, tobacco use who is admitted with cellulitis of the right lower extremity and abscess of the right knee.  Assessment and Plan: * Cellulitis of right lower extremity Presenting with right knee cellulitis/superficial abscess as well as concern for right upper posteromedial thigh cellulitis.  CT imaging without evidence of fluid collections or abscess. -S/p I&D of right knee abscess by EDP -Continue empiric IV vancomycin and ceftriaxone -Follow-up MRI to assess for myositis or deep infection  Peripheral vascular disease S/p LLE angioplasty and stenting (left superficial femoral and popliteal arteries, left anterior tibial) on Eliquis and Plavix. -Hold Eliquis and Plavix tonight pending MRI -If no surgical needs seen on  imaging then can likely resume both meds tomorrow  COPD (chronic obstructive pulmonary disease) (HCC) Mild wheezing on admission without hypoxia or respiratory distress. -Continue Breo and Combivent as needed  Normocytic anemia Mixed anemia of chronic disease and B12 deficiency.  Hemoglobin stable.  Tobacco use Reports smoking 1 pack/day.  Nicotine patch provided.  Smoking cessation advised.  Chronic pain syndrome Continue home methadone 85 mg daily.  DVT prophylaxis: SCDs Start: 04/28/22 2002 Code Status: Full code, confirmed with patient on admission Family Communication: Discussed with patient, he has discussed with family Disposition Plan: From home and likely discharge to home pending clinical progress Consults called: EDP discussed with on-call orthopedics Severity of Illness: The appropriate patient status for this patient is INPATIENT. Inpatient status is judged to be reasonable and necessary in order to provide the required intensity of service to ensure the patient's safety. The patient's presenting symptoms, physical exam findings, and initial radiographic and laboratory data in the context of their chronic comorbidities is felt to place them at high risk for further clinical deterioration. Furthermore, it is not anticipated that the patient will be medically stable for discharge from the hospital within 2 midnights of admission.   * I certify that at the point of admission it is my clinical judgment that the patient will require inpatient hospital care spanning beyond 2 midnights from the point of admission due to high intensity of service, high risk for further deterioration and high frequency of surveillance required.Zada Finders MD [facility]  If 7PM-7AM, please contact night-coverage [email or web address]  04/28/2022, 9:44 PM

## 2022-04-28 NOTE — Telephone Encounter (Signed)
That is highly unlikely because if he could see his stents, that would mean this artery was exposed. If his artery were exposed, he would bleed heavily and would need immediate intervention.  We can bring him in with ABIs to determine if there was any damage done to his stents.  If not, he would need to see his PCP to treat his continued knee pain and discomfort

## 2022-04-28 NOTE — ED Provider Notes (Signed)
Shared visit.  Patient seen by prior team.  He is here with right knee/right upper leg infection.  Failed outpatient oral therapy.  CT scan was done that showed soft tissue infection in this area.  Incidentally found to have may be cellulitic/inflammatory/infectious process in the right upper thigh at an old radiation site from prior cancer.  Talked with Dr. Doran Durand with orthopedics.  This area clinically on exam does not appear to be infectious.  We will get an MRI with and without to further evaluate this area.  He is getting IV antibiotics either way for infection in his right thigh.  To be admitted to medicine for further care.  Orthopedics can be consulted in the a.m. to go over MRI and evaluate patient.  This chart was dictated using voice recognition software.  Despite best efforts to proofread,  errors can occur which can change the documentation meaning.    Lennice Sites, DO 04/28/22 [phone]

## 2022-04-28 NOTE — Hospital Course (Signed)
Martin [name] is a 61 y.o. male with medical history significant for COPD, PAD s/p LLE angioplasty and stenting (left superficial femoral and popliteal arteries, left anterior tibial) 01/14/2022 on Eliquis and Plavix, history of CVA, anemia, history of anal cancer (s/p excision, chemotherapy, radiation), chronic pain on methadone, tobacco use who is admitted with cellulitis of the right lower extremity and abscess of the right knee.

## 2022-04-28 NOTE — Assessment & Plan Note (Signed)
Continue home methadone 85 mg daily.

## 2022-04-28 NOTE — Telephone Encounter (Signed)
I spoke with the patient and he informed that he was currently at the ER.

## 2022-04-28 NOTE — Assessment & Plan Note (Signed)
Reports smoking 1 pack/day.  Nicotine patch provided.  Smoking cessation advised.

## 2022-04-28 NOTE — Assessment & Plan Note (Signed)
Presenting with right knee cellulitis/superficial abscess as well as concern for right upper posteromedial thigh cellulitis.  CT imaging without evidence of fluid collections or abscess. -S/p I&D of right knee abscess by EDP -Continue empiric IV vancomycin and ceftriaxone -Follow-up MRI to assess for myositis or deep infection

## 2022-04-28 NOTE — ED Provider Notes (Signed)
Physical Exam  BP (!) 111/37   Pulse (!) 49   Temp 98.2 F (36.8 C) (Oral)   Resp 16   SpO2 100%   Physical Exam Vitals and nursing note reviewed.  Constitutional:      General: He is not in acute distress.    Appearance: He is not ill-appearing or toxic-appearing.  HENT:     Head: Normocephalic and atraumatic.  Pulmonary:     Effort: Pulmonary effort is normal. No respiratory distress.  Genitourinary:    Comments: Lichenification noted to the right groin with some induration, but no increase in warmth or erythema.  Musculoskeletal:     Comments: Discharge and erythema noted to the superior right knee. Full ROM with some pain. Dopplerable pulse. Compartments soft. Some erythema and tenderness overlying the vessel to the media aspect.   Neurological:     Mental Status: He is alert.     Procedures  Procedures  ED Course / MDM    Medical Decision Making Amount and/or Complexity of Data Reviewed Labs: ordered. Radiology: ordered.  Risk Prescription drug management. Decision regarding hospitalization.   Accepted handoff at shift change from [facility], [place]. Please see prior provider note for more detail.   Briefly: Patient is 61 y.o. M presents to the ED for evaluation of left knee pain worsening over the past week.  DDX: concern for infection.   Plan: Follow up on CT imaging.   Received this patient in handoff.   Imaging concerning for some mild soft tissue edema at the posterior high medial thigh at the level of the gluteus maximus measuring 4.6 x 1.8 x 1.4 cm.  On evaluation, patient does have some induration/lichenification noted to this area.  Patient reports he has consistent rash since his radiation from his colorectal cancer.  He reports its been intermittent for years.  They do not see any osseous abnormality.  There are some mild generalized subcutaneous soft tissue edema around the knee and lateral aspect of the leg without evidence of any fluid  collection or abscess.  We will place patient on vancomycin.  I think a medical admission is reasonable given that he has failed doxycycline treatment and has these new edema finding on CT imaging.  My attending consulted orthopedics and spoke to them about ordering an MRI inpatient.  Hospital medicine was consulted and my attending spoke with Dr. Posey Pronto.  Patient amenable to admission.  I discussed this case with my attending physician who cosigned this note including patient's presenting symptoms, physical exam, and planned diagnostics and interventions. Attending physician stated agreement with plan or made changes to plan which were implemented.   Attending physician assessed patient at bedside.  CT FEMUR RIGHT W CONTRAST  Result Date: 04/28/2022 CLINICAL DATA:  Soft tissue infection of the thigh suspected. EXAM: CT OF THE LOWER RIGHT EXTREMITY WITH CONTRAST TECHNIQUE: Multidetector CT imaging of the lower right extremity was performed according to the standard protocol following intravenous contrast administration from hip joint to below ankle. RADIATION DOSE REDUCTION: This exam was performed according to the departmental dose-optimization program which includes automated exposure control, adjustment of the mA and/or kV according to patient size and/or use of iterative reconstruction technique. CONTRAST:  149m OMNIPAQUE IOHEXOL 300 MG/ML  SOLN COMPARISON:  None Available. FINDINGS: Bones/Joint/Cartilage No fracture or dislocation. Normal alignment. No joint effusion. No cortical erosion or periosteal reaction. Ligaments Ligaments are suboptimally evaluated by CT. Muscles and Tendons Muscles of the gluteal region, flexor, extensor and adductor compartment of  the thigh are within normal limits. Muscles of the flexor, extensor and peroneal compartments of the leg are also within normal limits. No intramuscular fluid collection or abscess. Tendons about the hip joint are intact. Quadriceps and patellar  tendon are intact. No evidence of tendon tear. Tendons Achilles tendon is intact. Flexor, extensor and peroneal tendons of the leg are also within normal limits. Soft tissue Mild subcutaneous soft tissue edema in the posterior high medial thigh at the level of the gluteus maximus muscle measuring approximately 4.6 x 1.8 by 1.4 cm. Mild subcutaneous soft tissue edema about the knee joint. Subcutaneous soft tissue edema extends along the lateral compartment of the leg without evidence of drainable fluid collection or abscess. No soft tissue mass. Vessels: There is a stent graft in the superficial femoral artery. Atherosclerotic calcification of the popliteal artery and branch vessels of the leg without evidence of significant stenosis. IMPRESSION: 1.  No acute osseous abnormality. 2. No intramuscular hematoma or fluid collection. Tendons are intact. 3. Skin thickening and focal soft tissue edema about the posterior/medial aspect of the thigh at the level of the gluteus maximus muscle measuring approximately 4.6 x 1.8 x 1.4 cm suggesting mild cellulitis. No definite drainable fluid collection or abscess. 4. Mild generalized subcutaneous soft tissue edema about the knee and lateral aspect of the leg without evidence of fluid collection or abscess. Electronically Signed   By: Keane Police D.O.   On: 04/28/2022 17:13   CT KNEE RIGHT W CONTRAST  Result Date: 04/28/2022 CLINICAL DATA:  Soft tissue infection of the thigh suspected. EXAM: CT OF THE LOWER RIGHT EXTREMITY WITH CONTRAST TECHNIQUE: Multidetector CT imaging of the lower right extremity was performed according to the standard protocol following intravenous contrast administration from hip joint to below ankle. RADIATION DOSE REDUCTION: This exam was performed according to the departmental dose-optimization program which includes automated exposure control, adjustment of the mA and/or kV according to patient size and/or use of iterative reconstruction technique.  CONTRAST:  154m OMNIPAQUE IOHEXOL 300 MG/ML  SOLN COMPARISON:  None Available. FINDINGS: Bones/Joint/Cartilage No fracture or dislocation. Normal alignment. No joint effusion. No cortical erosion or periosteal reaction. Ligaments Ligaments are suboptimally evaluated by CT. Muscles and Tendons Muscles of the gluteal region, flexor, extensor and adductor compartment of the thigh are within normal limits. Muscles of the flexor, extensor and peroneal compartments of the leg are also within normal limits. No intramuscular fluid collection or abscess. Tendons about the hip joint are intact. Quadriceps and patellar tendon are intact. No evidence of tendon tear. Tendons Achilles tendon is intact. Flexor, extensor and peroneal tendons of the leg are also within normal limits. Soft tissue Mild subcutaneous soft tissue edema in the posterior high medial thigh at the level of the gluteus maximus muscle measuring approximately 4.6 x 1.8 by 1.4 cm. Mild subcutaneous soft tissue edema about the knee joint. Subcutaneous soft tissue edema extends along the lateral compartment of the leg without evidence of drainable fluid collection or abscess. No soft tissue mass. Vessels: There is a stent graft in the superficial femoral artery. Atherosclerotic calcification of the popliteal artery and branch vessels of the leg without evidence of significant stenosis. IMPRESSION: 1.  No acute osseous abnormality. 2. No intramuscular hematoma or fluid collection. Tendons are intact. 3. Skin thickening and focal soft tissue edema about the posterior/medial aspect of the thigh at the level of the gluteus maximus muscle measuring approximately 4.6 x 1.8 x 1.4 cm suggesting mild cellulitis. No definite drainable  fluid collection or abscess. 4. Mild generalized subcutaneous soft tissue edema about the knee and lateral aspect of the leg without evidence of fluid collection or abscess. Electronically Signed   By: Keane Police D.O.   On: 04/28/2022 17:13    CT TIBIA FIBULA RIGHT W CONTRAST  Result Date: 04/28/2022 CLINICAL DATA:  Soft tissue infection of the thigh suspected. EXAM: CT OF THE LOWER RIGHT EXTREMITY WITH CONTRAST TECHNIQUE: Multidetector CT imaging of the lower right extremity was performed according to the standard protocol following intravenous contrast administration from hip joint to below ankle. RADIATION DOSE REDUCTION: This exam was performed according to the departmental dose-optimization program which includes automated exposure control, adjustment of the mA and/or kV according to patient size and/or use of iterative reconstruction technique. CONTRAST:  146m OMNIPAQUE IOHEXOL 300 MG/ML  SOLN COMPARISON:  None Available. FINDINGS: Bones/Joint/Cartilage No fracture or dislocation. Normal alignment. No joint effusion. No cortical erosion or periosteal reaction. Ligaments Ligaments are suboptimally evaluated by CT. Muscles and Tendons Muscles of the gluteal region, flexor, extensor and adductor compartment of the thigh are within normal limits. Muscles of the flexor, extensor and peroneal compartments of the leg are also within normal limits. No intramuscular fluid collection or abscess. Tendons about the hip joint are intact. Quadriceps and patellar tendon are intact. No evidence of tendon tear. Tendons Achilles tendon is intact. Flexor, extensor and peroneal tendons of the leg are also within normal limits. Soft tissue Mild subcutaneous soft tissue edema in the posterior high medial thigh at the level of the gluteus maximus muscle measuring approximately 4.6 x 1.8 by 1.4 cm. Mild subcutaneous soft tissue edema about the knee joint. Subcutaneous soft tissue edema extends along the lateral compartment of the leg without evidence of drainable fluid collection or abscess. No soft tissue mass. Vessels: There is a stent graft in the superficial femoral artery. Atherosclerotic calcification of the popliteal artery and branch vessels of the leg  without evidence of significant stenosis. IMPRESSION: 1.  No acute osseous abnormality. 2. No intramuscular hematoma or fluid collection. Tendons are intact. 3. Skin thickening and focal soft tissue edema about the posterior/medial aspect of the thigh at the level of the gluteus maximus muscle measuring approximately 4.6 x 1.8 x 1.4 cm suggesting mild cellulitis. No definite drainable fluid collection or abscess. 4. Mild generalized subcutaneous soft tissue edema about the knee and lateral aspect of the leg without evidence of fluid collection or abscess. Electronically Signed   By: IKeane PoliceD.O.   On: 04/28/2022 17:13   CT Angio Chest PE W and/or Wo Contrast  Result Date: 04/19/2022 CLINICAL DATA:  Hemoptysis EXAM: CT ANGIOGRAPHY CHEST WITH CONTRAST TECHNIQUE: Multidetector CT imaging of the chest was performed using the standard protocol during bolus administration of intravenous contrast. Multiplanar CT image reconstructions and MIPs were obtained to evaluate the vascular anatomy. RADIATION DOSE REDUCTION: This exam was performed according to the departmental dose-optimization program which includes automated exposure control, adjustment of the mA and/or kV according to patient size and/or use of iterative reconstruction technique. CONTRAST:  832mOMNIPAQUE IOHEXOL 350 MG/ML SOLN COMPARISON:  Previous studies including the CT done on 07Jul 26, 2021nd chest radiograph done earlier today FINDINGS: Cardiovascular: Contrast density in the lumen of thoracic aorta is less than optimal. Scattered coronary artery calcifications are seen. There are no intraluminal filling defects in pulmonary artery branches. Mediastinum/Nodes: No significant lymphadenopathy seen. Lungs/Pleura: There are blebs and bullae in both apices and both lower lung fields. There is no  focal pulmonary consolidation. Centrilobular emphysema is seen. There is no pleural effusion or pneumothorax. Upper Abdomen: There is a fluid-filled structure  inseparable from the posterior margin of fundus of the stomach, possibly a diverticulum. Musculoskeletal: No acute findings are seen. Review of the MIP images confirms the above findings. IMPRESSION: There is no evidence of pulmonary artery embolism. There is no focal pulmonary consolidation. There are scattered coronary artery calcifications.  COPD. Other findings as described in the body of the report. Electronically Signed   By: Elmer Picker M.D.   On: 04/19/2022 16:34   DG Chest 2 View  Result Date: 04/19/2022 CLINICAL DATA:  Shortness of breath, hemoptysis, cough EXAM: CHEST - 2 VIEW COMPARISON:  01/22/2022 FINDINGS: Cardiac size is within normal limits. Low position of diaphragms may suggest COPD. There are no signs of pulmonary edema or focal pulmonary consolidation. There is no pleural effusion or pneumothorax. IMPRESSION: There are no signs of pulmonary edema or focal pulmonary consolidation. Electronically Signed   By: Elmer Picker M.D.   On: 04/19/2022 15:38          Sherrell Puller, PA-C 04/28/22 [place], [place], DO 04/28/22 2127

## 2022-04-28 NOTE — ED Provider Triage Note (Signed)
Emergency Medicine Provider Triage Evaluation Note  Martin [name] , a 61 y.o. male  was evaluated in triage.  Pt complains of right knee cut and swelling. The patient reports that about a week ago, he cut his knee on a piece of sheet metal. He reports that it formed a pus head and he was able to get some infection out. A few days ago he went to UC and was placed on doxycycline. He reports It has been getting worse and he has swelling and erythema noted to a vessel in his leg thatt he is concerned about because there are stents.  Review of Systems  Positive:  Negative:   Physical Exam  BP (!) 122/58 (BP Location: Right Arm)   Pulse 68   Temp 98.2 F (36.8 C) (Oral)   Resp 18   SpO2 100%  Gen:   Awake, no distress   Resp:  Normal effort  MSK:   Moves extremities without difficulty  Other:  Erythema superior to the right knee with central pustule noted to the medial aspect. Dopplerable pulse. Full extension and flexion of knee with pain. Swelling and erythema overlying the vessel on the anterior thigh.   Medical Decision Making  Medically screening exam initiated at 12:46 PM.  Appropriate orders placed.  Martin [name] was informed that the remainder of the evaluation will be completed by another provider, this initial triage assessment does not replace that evaluation, and the importance of remaining in the ED until their evaluation is complete.  Will order labs and CT   Sherrell Puller, Hershal Coria 04/28/22 1248

## 2022-04-28 NOTE — Assessment & Plan Note (Signed)
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  Hemoglobin stable.

## 2022-04-28 NOTE — Progress Notes (Signed)
Pharmacy Antibiotic Note  Martin [name] is a 61 y.o. male admitted on 04/28/2022 with worsening knee pain and soft tissue edema. Patient was taking doxycycline PTA after cutting his knee on a piece of metal ~1 week ago. Pharmacy has been consulted for vancomycin dosing.  CT of the R femur, knee, tibia/fibula showed skin thickening and focal soft tissue swelling concerning for cellulitis. No drainable abscess or fluid collection. I&D performed in the ED w/ purulent drainage noted.   Plan: S/p vancomycin 1069m IV x1 in ED Start vancomycin 12545mIV q24 hours (eAUC 438, Scr 1.02, TBW) F/u MRI, clinical improvement, ability to narrow antibiotics    Temp (24hrs), Avg:98.4 F (36.9 C), Min:98.2 F (36.8 C), Max:98.6 F (37 C)  Recent Labs  Lab 04/28/22 1257  WBC 11.7*  CREATININE 1.02  LATICACIDVEN 0.9    Estimated Creatinine Clearance: 68.2 mL/min (by C-G formula based on SCr of 1.02 mg/dL).    Allergies  Allergen Reactions   Aspirin Swelling    Lip swelling   Yellow Jacket Venom [Bee Venom] Anaphylaxis   Codeine Nausea Only   Ibuprofen     On blood thinners   Morphine And Related Itching    Antimicrobials this admission: Vancomycin 8/21 >>   Dose adjustments this admission:  Microbiology results: None this admit  Thank you for allowing pharmacy to be a part of this patient's care.  MaDimple NanasPharmD, BCPS 04/28/2022 8:10 PM

## 2022-04-28 NOTE — Assessment & Plan Note (Signed)
Mild wheezing on admission without hypoxia or respiratory distress. -Continue Breo and Combivent as needed

## 2022-04-28 NOTE — Progress Notes (Signed)
A consult was received from an ED physician for vancomycin per pharmacy dosing (for an indication other than meningitis). The patient's profile has been reviewed for ht/wt/allergies/indication/available labs. A one time order has been placed for the above antibiotics.  Further antibiotics/pharmacy consults should be ordered by admitting physician if indicated.                       Reuel Boom, PharmD, BCPS [phone] 04/28/2022, 5:56 PM

## 2022-04-28 NOTE — ED Provider Notes (Signed)
[facility] DEPT Provider Note   CSN: [id number] Arrival date & time: 04/28/22  1141     History  Chief Complaint  Patient presents with   Knee Pain    Martin [name] is a 61 y.o. male.  Patient presents to the hospital concern of a right-sided knee pain secondary to possible infection from an injury with a piece of metal.  Patient states that he cut his right knee on a piece of sheet metal approximately 1 week ago.  It formed a pus head and he was able to get some infection out.  He went to urgent care a few days ago and was placed on doxycycline.  He states that the infection/pain has been getting worse and he has increased swelling and erythema since that time.  He also has what appears to be erythema which is now going up the medial portion of the right thigh.  The patient states he has a stent in the leg and is concerned that it may be traveling towards the stent.  He denies fevers, shortness of breath, chest pain.  Past medical history is significant for PVD, COPD, history of substance abuse, history of bowel resection, arthritis, hypertension, history of stroke, history of MSSA bacteremia  HPI     Home Medications Prior to Admission medications   Medication Sig Start Date End Date Taking? Authorizing Provider  acetaminophen (TYLENOL) 500 MG tablet Take 1,000 mg by mouth every 6 (six) hours as needed for mild pain.    [provider]  apixaban (ELIQUIS) 2.5 MG TABS tablet Take 1 tablet (2.5 mg total) by mouth 2 (two) times daily. 01/14/22   Schnier, Dolores Lory, MD  clopidogrel (PLAVIX) 75 MG tablet Take 1 tablet by mouth once daily 02/28/22   Schnier, Dolores Lory, MD  econazole nitrate 1 % cream Apply topically daily. 01/17/22   Nyoka Lint, PA-C  EPINEPHrine (EPIPEN 2-PAK) 0.3 mg/0.3 mL IJ SOAJ injection Inject 0.3 mLs (0.3 mg total) into the muscle as needed for anaphylaxis. 02/23/20   Long, Wonda Olds, MD  fluticasone furoate-vilanterol (BREO  ELLIPTA) 100-25 MCG/ACT AEPB Inhale 1 puff into the lungs daily. 01/22/22   Crain, Whitney L, PA  folic acid (FOLVITE) 1 MG tablet Take 1 tablet (1 mg total) by mouth daily. 10/18/21   Elgergawy, Silver Huguenin, MD  Ipratropium-Albuterol (COMBIVENT RESPIMAT) 20-100 MCG/ACT AERS respimat Inhale 1 puff into the lungs every 6 (six) hours as needed for wheezing or shortness of breath. 01/22/22   Crain, Whitney L, PA  methadone (DOLOPHINE) 10 MG/ML solution Take 70 mg by mouth daily. Confirmed with [facility] 10/12/21    [provider]  methocarbamol (ROBAXIN) 500 MG tablet Take 1 tablet (500 mg total) by mouth at bedtime as needed for muscle spasms. 03/14/22   Lamptey, Myrene Galas, MD  ondansetron (ZOFRAN) 4 MG tablet Take 1 tablet (4 mg total) by mouth every 6 (six) hours. 03/02/22   Ward, Lenise Arena, PA-C  sulfamethoxazole-trimethoprim (BACTRIM DS) 800-160 MG tablet Take 1 tablet by mouth 2 (two) times daily for 7 days. 04/25/22 05/02/22  Francene Finders, PA-C  tamsulosin [facility]) 0.4 MG CAPS capsule Take 1 capsule by mouth at bedtime 02/11/22   Camillia Herter, NP  sucralfate (CARAFATE) 1 GM/10ML suspension Take 10 mLs (1 g total) by mouth 4 (four) times daily -  with meals and at bedtime. Patient not taking: Reported on 11/28/2019 10/21/19 06/13/20  Veatrice Kells, MD  Allergies    Aspirin, Yellow jacket venom [bee venom], Codeine, Ibuprofen, and Morphine and related    Review of Systems   Review of Systems  Constitutional:  Negative for fever.  Respiratory:  Negative for shortness of breath.   Cardiovascular:  Negative for chest pain.  Gastrointestinal:  Negative for abdominal pain.  Skin:  Positive for wound.       Red wound on anterior surface of the knee with redness streaking up the medial right thigh    Physical Exam Updated Vital Signs BP 122/72   Pulse 73   Temp 98.2 F (36.8 C) (Oral)   Resp 16   SpO2 93%  Physical Exam Vitals and nursing note reviewed.  Constitutional:       General: He is not in acute distress.    Appearance: He is normal weight.  HENT:     Head: Normocephalic and atraumatic.     Mouth/Throat:     Mouth: Mucous membranes are moist.  Eyes:     Conjunctiva/sclera: Conjunctivae normal.  Cardiovascular:     Rate and Rhythm: Normal rate and regular rhythm.     Pulses: Normal pulses.     Heart sounds: Normal heart sounds.  Pulmonary:     Effort: Pulmonary effort is normal.     Breath sounds: Normal breath sounds.  Abdominal:     Palpations: Abdomen is soft.     Tenderness: There is no abdominal tenderness.  Musculoskeletal:        General: Normal range of motion.     Cervical back: Normal range of motion and neck supple.     Comments: Wound and redness to right knee as noted in image below  Skin:    General: Skin is warm and dry.     Findings: Lesion present.  Neurological:     Mental Status: He is alert and oriented to person, place, and time.      ED Results / Procedures / Treatments   Labs (all labs ordered are listed, but only abnormal results are displayed) Labs Reviewed  CBC WITH DIFFERENTIAL/PLATELET - Abnormal; Notable for the following components:      Result Value   WBC 11.7 (*)    RBC 3.29 (*)    Hemoglobin 9.6 (*)    HCT 29.5 (*)    RDW 15.9 (*)    Neutro Abs 8.9 (*)    All other components within normal limits  COMPREHENSIVE METABOLIC PANEL - Abnormal; Notable for the following components:   Sodium 132 (*)    CO2 21 (*)    Calcium 8.6 (*)    Total Bilirubin <0.1 (*)    Anion gap 4 (*)    All other components within normal limits  LACTIC ACID, PLASMA  LACTIC ACID, PLASMA    EKG None  Radiology No results found.  Procedures .Marland KitchenIncision and Drainage  Date/Time: 04/28/2022 3:49 PM  Performed by: Dorothyann Peng, PA-C Authorized by: Dorothyann Peng, PA-C   Consent:    Consent obtained:  Verbal   Consent given by:  Patient   Risks, benefits, and alternatives were discussed: yes     Risks  discussed:  Bleeding, incomplete drainage and pain   Alternatives discussed:  No treatment and delayed treatment Universal protocol:    Procedure explained and questions answered to patient or proxy's satisfaction: yes     Relevant documents present and verified: yes     Test results available : yes     Required blood products,  implants, devices, and special equipment available: yes     Site/side marked: yes     Immediately prior to procedure, a time out was called: yes     Patient identity confirmed:  Verbally with patient Location:    Type:  Abscess   Size:  2cmx3cmx0.5cm   Location:  Lower extremity   Lower extremity location:  Knee   Knee location:  R knee Pre-procedure details:    Skin preparation:  Povidone-iodine Sedation:    Sedation type:  None Anesthesia:    Anesthesia method:  Local infiltration   Local anesthetic:  Lidocaine 2% WITH epi Procedure type:    Complexity:  Simple Procedure details:    Incision types:  Single straight   Incision depth:  Dermal   Wound management:  Probed and deloculated and irrigated with saline   Drainage:  Purulent   Drainage amount:  Moderate   Wound treatment:  Wound left open   Packing materials:  None Post-procedure details:    Procedure completion:  Tolerated     Medications Ordered in ED Medications  lidocaine-EPINEPHrine (XYLOCAINE W/EPI) 2 %-1:200000 (PF) injection 20 mL (has no administration in time range)  oxyCODONE-acetaminophen (PERCOCET/ROXICET) 5-325 MG per tablet 1 tablet (has no administration in time range)    ED Course/ Medical Decision Making/ A&P                           Medical Decision Making  Patient presents with a chief concern of right knee infection.  Differential includes but is not limited to abscess, cellulitis, septic arthritis less, and others  I reviewed the patient's past medical history including recent chart from urgent care showing his visit for the same wound and subsequent  prescription for doxycycline  I ordered and reviewed labs.  Pertinent results include lactic acid of 0.9, WBC 11.7, hemoglobin 9.6, sodium 132  Imaging was ordered from triage including CT femur, knee, tibia-fibula on the right side with contrast.  Images have not been completed at this time  Abscess was drained as noted above.  Patient care being transferred to [facility], PA-C at shift handoff. Plan for discharge/admission pending CT results. If CT not concerning, probable discharge with new antibiotic prescription.         Final Clinical Impression(s) / ED Diagnoses Final diagnoses:  Acute pain of right knee  Abscess    Rx / DC Orders ED Discharge Orders     None         Ronny Bacon 04/28/22 1555    Pattricia Boss, MD 04/28/22 [phone]

## 2022-04-28 NOTE — Assessment & Plan Note (Signed)
S/p LLE angioplasty and stenting (left superficial femoral and popliteal arteries, left anterior tibial) on Eliquis and Plavix. -Hold Eliquis and Plavix tonight pending MRI -If no surgical needs seen on imaging then can likely resume both meds tomorrow

## 2022-04-28 NOTE — ED Notes (Signed)
Pt transported to MRI and then room 1318.

## 2022-04-29 DIAGNOSIS — M25561 Pain in right knee: Secondary | ICD-10-CM

## 2022-04-29 DIAGNOSIS — L0291 Cutaneous abscess, unspecified: Secondary | ICD-10-CM

## 2022-04-29 DIAGNOSIS — G894 Chronic pain syndrome: Secondary | ICD-10-CM

## 2022-04-29 DIAGNOSIS — L03115 Cellulitis of right lower limb: Secondary | ICD-10-CM | POA: Diagnosis not present

## 2022-04-29 DIAGNOSIS — E43 Unspecified severe protein-calorie malnutrition: Secondary | ICD-10-CM | POA: Insufficient documentation

## 2022-04-29 LAB — CBC
HCT: 29.4 % — ABNORMAL LOW (ref 39.0–52.0)
Hemoglobin: 9.3 g/dL — ABNORMAL LOW (ref 13.0–17.0)
MCH: 28.7 pg (ref 26.0–34.0)
MCHC: 31.6 g/dL (ref 30.0–36.0)
MCV: 90.7 fL (ref 80.0–100.0)
Platelets: 141 10*3/uL — ABNORMAL LOW (ref 150–400)
RBC: 3.24 MIL/uL — ABNORMAL LOW (ref 4.22–5.81)
RDW: 15.9 % — ABNORMAL HIGH (ref 11.5–15.5)
WBC: 8.3 10*3/uL (ref 4.0–10.5)
nRBC: 0 % (ref 0.0–0.2)

## 2022-04-29 LAB — BASIC METABOLIC PANEL
Anion gap: 5 (ref 5–15)
BUN: 11 mg/dL (ref 8–23)
CO2: 24 mmol/L (ref 22–32)
Calcium: 8.5 mg/dL — ABNORMAL LOW (ref 8.9–10.3)
Chloride: 108 mmol/L (ref 98–111)
Creatinine, Ser: 0.94 mg/dL (ref 0.61–1.24)
GFR, Estimated: >60 mL/min (ref >60)
Glucose, Bld: 91 mg/dL (ref 70–99)
Potassium: 4.4 mmol/L (ref 3.5–5.1)
Sodium: 137 mmol/L (ref 135–145)

## 2022-04-29 LAB — LACTIC ACID, PLASMA: Lactic Acid, Venous: 0.8 mmol/L (ref 0.5–1.9)

## 2022-04-29 MED ORDER — FLUOCINONIDE 0.05 % EX CREA
TOPICAL_CREAM | Freq: Two times a day (BID) | CUTANEOUS | Status: DC
  Filled 2022-04-29: qty 15

## 2022-04-29 MED ORDER — ADULT MULTIVITAMIN W/MINERALS CH
1.0000 | ORAL_TABLET | Freq: Every day | ORAL | Status: DC
  Administered 2022-04-29 – 2022-05-01 (×3): 1 via ORAL
  Filled 2022-04-29 (×3): qty 1

## 2022-04-29 MED ORDER — CLOPIDOGREL BISULFATE 75 MG PO TABS
75.0000 mg | ORAL_TABLET | Freq: Every day | ORAL | Status: DC
  Administered 2022-04-29 – 2022-05-01 (×3): 75 mg via ORAL
  Filled 2022-04-29 (×3): qty 1

## 2022-04-29 MED ORDER — FLUOCINONIDE EMULSIFIED BASE 0.05 % EX CREA
1.0000 | TOPICAL_CREAM | Freq: Two times a day (BID) | CUTANEOUS | Status: DC
  Filled 2022-04-29: qty 1

## 2022-04-29 MED ORDER — ENSURE ENLIVE PO LIQD
237.0000 mL | Freq: Three times a day (TID) | ORAL | Status: DC
  Administered 2022-04-29 – 2022-05-01 (×3): 237 mL via ORAL

## 2022-04-29 MED ORDER — APIXABAN 2.5 MG PO TABS
2.5000 mg | ORAL_TABLET | Freq: Two times a day (BID) | ORAL | Status: DC
  Administered 2022-04-29 – 2022-05-01 (×5): 2.5 mg via ORAL
  Filled 2022-04-29 (×5): qty 1

## 2022-04-29 MED ORDER — MELATONIN 5 MG PO TABS
10.0000 mg | ORAL_TABLET | Freq: Every evening | ORAL | Status: DC | PRN
  Administered 2022-04-29 – 2022-04-30 (×2): 10 mg via ORAL
  Filled 2022-04-29 (×2): qty 2

## 2022-04-29 NOTE — Progress Notes (Signed)
PROGRESS NOTE    Martin [name]  QMV:[id number] DOB: Sep 17, 1960 DOA: 04/28/2022 PCP: Camillia Herter, NP    Brief Narrative:  61 year old male with history of peripheral vascular disease, COPD, anal cancer, presents to the hospital with right knee infection and underlying abscess.  This was drained in the emergency room.  He was admitted for IV antibiotics.  Seen by orthopedics with no further surgical intervention planned at this time.   Assessment & Plan:   Principal Problem:   Cellulitis of right lower extremity Active Problems:   Peripheral vascular disease   COPD (chronic obstructive pulmonary disease) (HCC)   Chronic pain syndrome   Tobacco use   Normocytic anemia   Protein-calorie malnutrition, severe   Right lower extremity cellulitis -Noted to have cellulitis and abscess over right knee -This was drained in the emergency room -Started on IV antibiotics -Imaging did not indicate any other deeper abscess -Seen by orthopedics, no further surgical intervention recommended at this time -Can follow-up with Ortho in the next few weeks -Plan to transition to oral antibiotics in the next 24 hours if shows continued improvement  Furuncle near base of penis -Apply warm compresses -Continue IV antibiotics  Peripheral vascular disease -Resume Plavix, Eliquis -Has strong pulses  COPD -No shortness of breath or wheezing -Continue on Breo and Combivent  Chronic pain syndrome -Continue home dose of methadone   DVT prophylaxis: apixaban (ELIQUIS) tablet 2.5 mg Start: 04/29/22 1500 SCDs Start: 04/28/22 2002 apixaban (ELIQUIS) tablet 2.5 mg  Code Status: Full code Family Communication: Discussed with patient Disposition Plan: Status is: Inpatient Remains inpatient appropriate because: Continued IV antibiotics     Consultants:  Orthopedics, Dr. Doran Durand  Procedures:  Incision and drainage of right knee performed in the emergency room by EDP  Antimicrobials:   Vancomycin 8/21 > Ceftriaxone 8/21 >   Subjective: Feels that overall swelling and pain in right knee is better since it was incised  Objective: Vitals:   04/29/22 0535 04/29/22 0757 04/29/22 0854 04/29/22 1213  BP: (!) 122/38  (!) 121/30 (!) 142/32  Pulse: 68  (!) 56 (!) 52  Resp: 16  17 17   Temp: 97.7 F (36.5 C)  97.6 F (36.4 C) 97.6 F (36.4 C)  TempSrc: Oral  Oral Oral  SpO2: 98% 95% 100% 100%  Weight:      Height:        Intake/Output Summary (Last 24 hours) at 04/29/2022 1558 Last data filed at 04/29/2022 1400 Gross per 24 hour  Intake 1380 ml  Output 1475 ml  Net -95 ml   Filed Weights   04/28/22 2315  Weight: 65 kg    Examination:  General exam: Appears calm and comfortable  Respiratory system: Clear to auscultation. Respiratory effort normal. Cardiovascular system: S1 & S2 heard, RRR. No JVD, murmurs, rubs, gallops or clicks. No pedal edema. Gastrointestinal system: Abdomen is nondistended, soft and nontender. No organomegaly or masses felt. Normal bowel sounds heard. Central nervous system: Alert and oriented. No focal neurological deficits. Extremities: Wound over right knee, draining purulent material with surrounding erythema Skin: Noted to have furuncle at base of penis Psychiatry: Judgement and insight appear normal. Mood & affect appropriate.     Data Reviewed: I have personally reviewed following labs and imaging studies  CBC: Recent Labs  Lab 04/28/22 1257 04/29/22 0431  WBC 11.7* 8.3  NEUTROABS 8.9*  --   HGB 9.6* 9.3*  HCT 29.5* 29.4*  MCV 89.7 90.7  PLT 168 141*  Basic Metabolic Panel: Recent Labs  Lab 04/28/22 1257 04/29/22 0431  NA 132* 137  K 4.6 4.4  CL 107 108  CO2 21* 24  GLUCOSE 88 91  BUN 13 11  CREATININE 1.02 0.94  CALCIUM 8.6* 8.5*   GFR: Estimated Creatinine Clearance: 74 mL/min (by C-G formula based on SCr of 0.94 mg/dL). Liver Function Tests: Recent Labs  Lab 04/28/22 1257  AST 19  ALT 17   ALKPHOS 93  BILITOT <0.1*  PROT 6.9  ALBUMIN 3.6   No results for input(s): "LIPASE", "AMYLASE" in the last 168 hours. No results for input(s): "AMMONIA" in the last 168 hours. Coagulation Profile: No results for input(s): "INR", "PROTIME" in the last 168 hours. Cardiac Enzymes: No results for input(s): "CKTOTAL", "CKMB", "CKMBINDEX", "TROPONINI" in the last 168 hours. BNP (last 3 results) No results for input(s): "PROBNP" in the last 8760 hours. HbA1C: No results for input(s): "HGBA1C" in the last 72 hours. CBG: No results for input(s): "GLUCAP" in the last 168 hours. Lipid Profile: No results for input(s): "CHOL", "HDL", "LDLCALC", "TRIG", "CHOLHDL", "LDLDIRECT" in the last 72 hours. Thyroid Function Tests: No results for input(s): "TSH", "T4TOTAL", "FREET4", "T3FREE", "THYROIDAB" in the last 72 hours. Anemia Panel: No results for input(s): "VITAMINB12", "FOLATE", "FERRITIN", "TIBC", "IRON", "RETICCTPCT" in the last 72 hours. Sepsis Labs: Recent Labs  Lab 04/28/22 1257 04/29/22 0431  LATICACIDVEN 0.9 0.8    No results found for this or any previous visit (from the past 240 hour(s)).       Radiology Studies: MR HIP RIGHT W WO CONTRAST  Result Date: 04/29/2022 CLINICAL DATA:  Hip pain, infection suspected. Right buttock cellulitis on CT. EXAM: MRI OF THE RIGHT HIP WITHOUT AND WITH CONTRAST TECHNIQUE: Multiplanar, multisequence MR imaging was performed both before and after administration of intravenous contrast. CONTRAST:  6.7m GADAVIST GADOBUTROL 1 MMOL/ML IV SOLN COMPARISON:  Right femur CT same date. FINDINGS: Bones: No evidence of acute fracture, dislocation or femoral head osteonecrosis. No evidence of osteomyelitis. There are mild sacroiliac degenerative changes bilaterally with subchondral cyst formation inferiorly in both iliac bones. No erosive changes or associated abnormal synovial enhancement. Articular cartilage and labrum Articular cartilage: No focal chondral  defect or subchondral signal abnormality identified. Mild degenerative changes of both hips. Labrum: There is no gross labral tear or paralabral abnormality. Joint or bursal effusion Joint effusion: No significant hip joint effusion. Mild right hip synovial enhancement within physiologic limits. Bursae: No focal periarticular fluid collection or suspicious soft tissue enhancement. Muscles and tendons Muscles and tendons: The visualized gluteus, hamstring and iliopsoas tendons appear normal. The piriformis muscles appear symmetric. Other findings Miscellaneous: Mild subcutaneous edema inferomedially in the right buttock without focal fluid collection or adjacent muscular involvement. No other inflammatory changes are identified. Small bilateral scrotal hydroceles are noted. IMPRESSION: 1. No evidence of acute fracture, septic arthritis or osteomyelitis. 2. Mild nonspecific subcutaneous inferomedially in the right buttock as seen on earlier CT. No focal fluid collection or suspicious soft tissue enhancement. 3. Mild sacroiliac and hip degenerative changes bilaterally. Electronically Signed   By: WRichardean SaleM.D.   On: 04/29/2022 08:13   CT FEMUR RIGHT W CONTRAST  Result Date: 04/28/2022 CLINICAL DATA:  Soft tissue infection of the thigh suspected. EXAM: CT OF THE LOWER RIGHT EXTREMITY WITH CONTRAST TECHNIQUE: Multidetector CT imaging of the lower right extremity was performed according to the standard protocol following intravenous contrast administration from hip joint to below ankle. RADIATION DOSE REDUCTION: This exam was performed  according to the departmental dose-optimization program which includes automated exposure control, adjustment of the mA and/or kV according to patient size and/or use of iterative reconstruction technique. CONTRAST:  1100m OMNIPAQUE IOHEXOL 300 MG/ML  SOLN COMPARISON:  None Available. FINDINGS: Bones/Joint/Cartilage No fracture or dislocation. Normal alignment. No joint effusion.  No cortical erosion or periosteal reaction. Ligaments Ligaments are suboptimally evaluated by CT. Muscles and Tendons Muscles of the gluteal region, flexor, extensor and adductor compartment of the thigh are within normal limits. Muscles of the flexor, extensor and peroneal compartments of the leg are also within normal limits. No intramuscular fluid collection or abscess. Tendons about the hip joint are intact. Quadriceps and patellar tendon are intact. No evidence of tendon tear. Tendons Achilles tendon is intact. Flexor, extensor and peroneal tendons of the leg are also within normal limits. Soft tissue Mild subcutaneous soft tissue edema in the posterior high medial thigh at the level of the gluteus maximus muscle measuring approximately 4.6 x 1.8 by 1.4 cm. Mild subcutaneous soft tissue edema about the knee joint. Subcutaneous soft tissue edema extends along the lateral compartment of the leg without evidence of drainable fluid collection or abscess. No soft tissue mass. Vessels: There is a stent graft in the superficial femoral artery. Atherosclerotic calcification of the popliteal artery and branch vessels of the leg without evidence of significant stenosis. IMPRESSION: 1.  No acute osseous abnormality. 2. No intramuscular hematoma or fluid collection. Tendons are intact. 3. Skin thickening and focal soft tissue edema about the posterior/medial aspect of the thigh at the level of the gluteus maximus muscle measuring approximately 4.6 x 1.8 x 1.4 cm suggesting mild cellulitis. No definite drainable fluid collection or abscess. 4. Mild generalized subcutaneous soft tissue edema about the knee and lateral aspect of the leg without evidence of fluid collection or abscess. Electronically Signed   By: IKeane PoliceD.O.   On: 04/28/2022 17:13   CT KNEE RIGHT W CONTRAST  Result Date: 04/28/2022 CLINICAL DATA:  Soft tissue infection of the thigh suspected. EXAM: CT OF THE LOWER RIGHT EXTREMITY WITH CONTRAST  TECHNIQUE: Multidetector CT imaging of the lower right extremity was performed according to the standard protocol following intravenous contrast administration from hip joint to below ankle. RADIATION DOSE REDUCTION: This exam was performed according to the departmental dose-optimization program which includes automated exposure control, adjustment of the mA and/or kV according to patient size and/or use of iterative reconstruction technique. CONTRAST:  103mOMNIPAQUE IOHEXOL 300 MG/ML  SOLN COMPARISON:  None Available. FINDINGS: Bones/Joint/Cartilage No fracture or dislocation. Normal alignment. No joint effusion. No cortical erosion or periosteal reaction. Ligaments Ligaments are suboptimally evaluated by CT. Muscles and Tendons Muscles of the gluteal region, flexor, extensor and adductor compartment of the thigh are within normal limits. Muscles of the flexor, extensor and peroneal compartments of the leg are also within normal limits. No intramuscular fluid collection or abscess. Tendons about the hip joint are intact. Quadriceps and patellar tendon are intact. No evidence of tendon tear. Tendons Achilles tendon is intact. Flexor, extensor and peroneal tendons of the leg are also within normal limits. Soft tissue Mild subcutaneous soft tissue edema in the posterior high medial thigh at the level of the gluteus maximus muscle measuring approximately 4.6 x 1.8 by 1.4 cm. Mild subcutaneous soft tissue edema about the knee joint. Subcutaneous soft tissue edema extends along the lateral compartment of the leg without evidence of drainable fluid collection or abscess. No soft tissue mass. Vessels: There is a  stent graft in the superficial femoral artery. Atherosclerotic calcification of the popliteal artery and branch vessels of the leg without evidence of significant stenosis. IMPRESSION: 1.  No acute osseous abnormality. 2. No intramuscular hematoma or fluid collection. Tendons are intact. 3. Skin thickening and  focal soft tissue edema about the posterior/medial aspect of the thigh at the level of the gluteus maximus muscle measuring approximately 4.6 x 1.8 x 1.4 cm suggesting mild cellulitis. No definite drainable fluid collection or abscess. 4. Mild generalized subcutaneous soft tissue edema about the knee and lateral aspect of the leg without evidence of fluid collection or abscess. Electronically Signed   By: Keane Police D.O.   On: 04/28/2022 17:13   CT TIBIA FIBULA RIGHT W CONTRAST  Result Date: 04/28/2022 CLINICAL DATA:  Soft tissue infection of the thigh suspected. EXAM: CT OF THE LOWER RIGHT EXTREMITY WITH CONTRAST TECHNIQUE: Multidetector CT imaging of the lower right extremity was performed according to the standard protocol following intravenous contrast administration from hip joint to below ankle. RADIATION DOSE REDUCTION: This exam was performed according to the departmental dose-optimization program which includes automated exposure control, adjustment of the mA and/or kV according to patient size and/or use of iterative reconstruction technique. CONTRAST:  171m OMNIPAQUE IOHEXOL 300 MG/ML  SOLN COMPARISON:  None Available. FINDINGS: Bones/Joint/Cartilage No fracture or dislocation. Normal alignment. No joint effusion. No cortical erosion or periosteal reaction. Ligaments Ligaments are suboptimally evaluated by CT. Muscles and Tendons Muscles of the gluteal region, flexor, extensor and adductor compartment of the thigh are within normal limits. Muscles of the flexor, extensor and peroneal compartments of the leg are also within normal limits. No intramuscular fluid collection or abscess. Tendons about the hip joint are intact. Quadriceps and patellar tendon are intact. No evidence of tendon tear. Tendons Achilles tendon is intact. Flexor, extensor and peroneal tendons of the leg are also within normal limits. Soft tissue Mild subcutaneous soft tissue edema in the posterior high medial thigh at the level  of the gluteus maximus muscle measuring approximately 4.6 x 1.8 by 1.4 cm. Mild subcutaneous soft tissue edema about the knee joint. Subcutaneous soft tissue edema extends along the lateral compartment of the leg without evidence of drainable fluid collection or abscess. No soft tissue mass. Vessels: There is a stent graft in the superficial femoral artery. Atherosclerotic calcification of the popliteal artery and branch vessels of the leg without evidence of significant stenosis. IMPRESSION: 1.  No acute osseous abnormality. 2. No intramuscular hematoma or fluid collection. Tendons are intact. 3. Skin thickening and focal soft tissue edema about the posterior/medial aspect of the thigh at the level of the gluteus maximus muscle measuring approximately 4.6 x 1.8 x 1.4 cm suggesting mild cellulitis. No definite drainable fluid collection or abscess. 4. Mild generalized subcutaneous soft tissue edema about the knee and lateral aspect of the leg without evidence of fluid collection or abscess. Electronically Signed   By: IKeane PoliceD.O.   On: 04/28/2022 17:13        Scheduled Meds:  apixaban  2.5 mg Oral BID   clopidogrel  75 mg Oral Daily   feeding supplement  237 mL Oral TID BM   fluocinonide cream   Topical BID   fluticasone furoate-vilanterol  1 puff Inhalation Daily   methadone  85 mg Oral Daily   multivitamin with minerals  1 tablet Oral Daily   nicotine  21 mg Transdermal Daily   tamsulosin  0.4 mg Oral QHS   Continuous  Infusions:  cefTRIAXone (ROCEPHIN)  IV 1 g (04/29/22 0011)   vancomycin       LOS: 1 day    Time spent: 47mns    JKathie Dike MD [facility]   If 7PM-7AM, please contact night-coverage [email or web address]  04/29/2022, 3:58 PM

## 2022-04-29 NOTE — Discharge Instructions (Signed)

## 2022-04-29 NOTE — Progress Notes (Signed)
Initial Nutrition Assessment  DOCUMENTATION CODES:   Severe malnutrition in context of chronic illness  INTERVENTION:   -Ensure Plus High Protein po TID, each supplement provides 350 kcal and 20 grams of protein.   -Multivitamin with minerals daily  -Checking vitamin labs: iron, folate, Vitamin B-12, Vitamin D  NUTRITION DIAGNOSIS:   Severe Malnutrition related to chronic illness (COPD, h/o anal cancer) as evidenced by energy intake < or equal to 75% for > or equal to 1 month, moderate fat depletion, severe muscle depletion.  GOAL:   Patient will meet greater than or equal to 90% of their needs  MONITOR:   PO intake, Supplement acceptance, Labs, Weight trends, I & O's  REASON FOR ASSESSMENT:   Malnutrition Screening Tool    ASSESSMENT:   61 y.o. male with medical history significant for COPD, PAD s/p LLE angioplasty and stenting (left superficial femoral and popliteal arteries, left anterior tibial) 01/14/2022 on Eliquis and Plavix, history of CVA, anemia, history of anal cancer (s/p excision, chemotherapy, radiation), chronic pain on methadone, tobacco use who presented to the ED for evaluation of right knee pain and swelling.  Patient in room, reports he ate his breakfast of eggs and potatoes but didn't enjoy it. He recently had his teeth pulled out so eating has been limited lately. Mainly consumes Ensure during the day and a dinner meal daily. Pt has a difficult time affording Ensure supplements. Will order for this admission. He has started taking a daily men's MVI. Has  history of folate and Vitamin B-12 deficiency, will check these labs.   Pt reports UBW is 160 lbs -around the time of his anal cancer diagnosis.  Current weight: 143 lbs.  Medications: Multivitamin with minerals daily  Labs reviewed.  NUTRITION - FOCUSED PHYSICAL EXAM:  Flowsheet Row Most Recent Value  Orbital Region Moderate depletion  Upper Arm Region Moderate depletion  Thoracic and Lumbar  Region Mild depletion  Buccal Region Moderate depletion  Temple Region Severe depletion  Clavicle Bone Region Severe depletion  Clavicle and Acromion Bone Region Severe depletion  Scapular Bone Region Severe depletion  Dorsal Hand Moderate depletion  Patellar Region Moderate depletion  Anterior Thigh Region Moderate depletion  Posterior Calf Region Moderate depletion  Edema (RD Assessment) None  Hair Reviewed  Eyes Reviewed  Mouth Reviewed  [no teeth, recently pulled]  Skin Reviewed  Nails Reviewed       Diet Order:   Diet Order             Diet regular Room service appropriate? Yes; Fluid consistency: Thin  Diet effective now                   EDUCATION NEEDS:   No education needs have been identified at this time  Skin:  Skin Assessment: Reviewed RN Assessment  Last BM:  8/21  Height:   Ht Readings from Last 1 Encounters:  04/28/22 5' 11"  (1.803 m)    Weight:   Wt Readings from Last 1 Encounters:  04/28/22 65 kg    BMI:  Body mass index is 19.99 kg/m.  Estimated Nutritional Needs:   Kcal:  1950-2150  Protein:  95-105g  Fluid:  2.1L/day  Clayton Bibles, MS, RD, LDN Inpatient Clinical Dietitian Contact information available via Amion

## 2022-04-29 NOTE — Consult Note (Signed)
Reason for Consult:  right knee pain Referring Physician: Dr. [name] is an 61 y.o. male.  HPI: 61 y/o male with PMH of anal cancer c/o R knee pain worsening over the last week.  He cut his knee superficially on a small piece of sheet metal flashing a week ago while working.  The cut was small and superficial and didn't bother him for several days.  A few days ago he describes a small pimple area that popped up next to the healing cut.  He squeezed it and got some white liquid out.  The following day he noted increased swelling and pain in the area.  He went to urgent care and was prescribed oral abx.  He presented tot he ER yesterday with increased pain and swelling.  He denies f/c/n/v/wt loss or change in appetite.  He says he had a radiation burn 10 years ago and has been prescribed a cream by Dr. Sondra Come at [facility] for a rash that appears intermittently around his groin.  He asks about getting in touch with Dr. Sondra Come to prescribe the cream since he's having a rash now.  He smokes but is not diabetic.  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   Peripheral vascular disease ([place])    Pneumonia    as child, cough at present with no fever   Pneumonia    as child, cough at present with no fever    Radiation 11/19/11-01/08/12   5040 cGy 28 fx Pelvis and inguinal area   Stroke ([place])    no  weaknes or paralysis   Substance abuse [facility])     Past Surgical History:  Procedure Laterality Date   APPENDECTOMY     age 63 or 69   EXAMINATION UNDER ANESTHESIA  10/14/2011   Procedure: EXAM UNDER ANESTHESIA;  Surgeon: Earnstine Regal, MD;  Location: [facility];  Service: General;  Laterality: N/A;  exam under anethesia, Excision of mass anal canal, 1.5cm   INGUINAL HERNIA REPAIR  05/11/2012   Procedure: HERNIA REPAIR INGUINAL ADULT;  Surgeon: Earnstine Regal, MD;  Location: [place];  Service: General;  Laterality: Left;  left inguinal hernia repair with mesh   LOWER EXTREMITY ANGIOGRAPHY Left 07/02/2021   Procedure: LOWER EXTREMITY ANGIOGRAPHY;  Surgeon: Katha Cabal, MD;  Location: [place] [facility];  Service: Cardiovascular;  Laterality: Left;   LOWER EXTREMITY ANGIOGRAPHY Right 07/16/2021   Procedure: LOWER EXTREMITY ANGIOGRAPHY;  Surgeon: Katha Cabal, MD;  Location: [place] [facility];  Service: Cardiovascular;  Laterality: Right;   LOWER EXTREMITY ANGIOGRAPHY Left 08/27/2021   Procedure: LOWER EXTREMITY ANGIOGRAPHY;  Surgeon: Katha Cabal, MD;  Location: [place] [facility];  Service: Cardiovascular;  Laterality: Left;  LOWER EXTREMITY ANGIOGRAPHY Left 01/14/2022   Procedure: Lower Extremity Angiography;  Surgeon: Katha Cabal, MD;  Location: [place] [facility];  Service: Cardiovascular;  Laterality: Left;   SMALL INTESTINE SURGERY  04/19/2017   exploratory lap, partial small bowel resection for perforated ulcer/abcess, radiation enteritis   surgical pathology   10/14/2011   squamous cell ca of anus   TEE WITHOUT CARDIOVERSION N/A 10/16/2021   Procedure: TRANSESOPHAGEAL ECHOCARDIOGRAM (TEE);  Surgeon: [name], MD;  Location: [facility] ENDOSCOPY;  Service: Cardiovascular;  Laterality: N/A;   TOOTH EXTRACTION N/A 12/04/2021   Procedure: DENTAL RESTORATION/EXTRACTIONS;  Surgeon: Diona Browner, DMD;  Location: [place];  Service: Oral Surgery;   Laterality: N/A;   transanal excision  10/14/2011   Dr.Todd Gerkin    Family History  Problem Relation Age of Onset   Colon cancer Father    Asthma Father    Cancer Father        prostate   Hypertension Father    Asthma Mother    Hypertension Mother    Stomach cancer Neg Hx    Esophageal cancer Neg Hx     Social History:  reports that he has been smoking cigarettes. He has a 47.00 pack-year smoking history. He has never used smokeless tobacco. He reports that he does not currently use alcohol. He reports that he does not currently use drugs after having used the following drugs: Marijuana and Cocaine.  Allergies:  Allergies  Allergen Reactions   Aspirin Swelling    Lip swelling   Yellow Jacket Venom [Bee Venom] Anaphylaxis   Codeine Nausea Only   Ibuprofen     On blood thinners   Morphine And Related Itching    Medications: I have reviewed the patient's current medications.  Results for orders placed or performed during the hospital encounter of 04/28/22 (from the past 48 hour(s))  CBC with Differential     Status: Abnormal   Collection Time: 04/28/22 12:57 PM  Result Value Ref Range   WBC 11.7 (H) 4.0 - 10.5 K/uL   RBC 3.29 (L) 4.22 - 5.81 MIL/uL   Hemoglobin 9.6 (L) 13.0 - 17.0 g/dL   HCT 29.5 (L) 39.0 - 52.0 %   MCV 89.7 80.0 - 100.0 fL   MCH 29.2 26.0 - 34.0 pg   MCHC 32.5 30.0 - 36.0 g/dL   RDW 15.9 (H) 11.5 - 15.5 %   Platelets 168 150 - 400 K/uL   nRBC 0.0 0.0 - 0.2 %   Neutrophils Relative % 76 %   Neutro Abs 8.9 (H) 1.7 - 7.7 K/uL   Lymphocytes Relative 16 %   Lymphs Abs 1.8 0.7 - 4.0 K/uL   Monocytes Relative 6 %   Monocytes Absolute 0.7 0.1 - 1.0 K/uL   Eosinophils Relative 1 %   Eosinophils Absolute 0.1 0.0 - 0.5 K/uL   Basophils Relative 1 %   Basophils Absolute 0.1 0.0 - 0.1 K/uL   Immature Granulocytes 0 %   Abs Immature Granulocytes 0.04 0.00 - 0.07 K/uL    Comment: Performed at [facility], [place] [address],  [place],  [address]  Comprehensive metabolic panel     Status: Abnormal   Collection Time: 04/28/22 12:57 PM  Result Value Ref Range   Sodium 132 (L) 135 - 145 mmol/L   Potassium 4.6 3.5 - 5.1 mmol/L   Chloride 107 98 - 111 mmol/L   CO2 21 (L) 22 - 32 mmol/L   Glucose, Bld 88 70 -  99 mg/dL    Comment: Glucose reference range applies only to samples taken after fasting for at least 8 hours.   BUN 13 8 - 23 mg/dL   Creatinine, Ser 1.02 0.61 - 1.24 mg/dL   Calcium 8.6 (L) 8.9 - 10.3 mg/dL   Total Protein 6.9 6.5 - 8.1 g/dL   Albumin 3.6 3.5 - 5.0 g/dL   AST 19 15 - 41 U/L   ALT 17 0 - 44 U/L   Alkaline Phosphatase 93 38 - 126 U/L   Total Bilirubin <0.1 (L) 0.3 - 1.2 mg/dL   GFR, Estimated >60 >60 mL/min    Comment: (NOTE) Calculated using the CKD-EPI Creatinine Equation (2021)    Anion gap 4 (L) 5 - 15    Comment: Performed at [facility], [place] [address], [place], [place] [address]  Lactic acid, plasma     Status: None   Collection Time: 04/28/22 12:57 PM  Result Value Ref Range   Lactic Acid, Venous 0.9 0.5 - 1.9 mmol/L    Comment: Performed at [facility], [place] [address], [place], [place] [address]  Lactic acid, plasma     Status: None   Collection Time: 04/29/22  4:31 AM  Result Value Ref Range   Lactic Acid, Venous 0.8 0.5 - 1.9 mmol/L    Comment: Performed at [facility], [place] [address] [place], [place] [address]  CBC     Status: Abnormal   Collection Time: 04/29/22  4:31 AM  Result Value Ref Range   WBC 8.3 4.0 - 10.5 K/uL   RBC 3.24 (L) 4.22 - 5.81 MIL/uL   Hemoglobin 9.3 (L) 13.0 - 17.0 g/dL   HCT 29.4 (L) 39.0 - 52.0 %   MCV 90.7 80.0 - 100.0 fL   MCH 28.7 26.0 - 34.0 pg   MCHC 31.6 30.0 - 36.0 g/dL   RDW 15.9 (H) 11.5 - 15.5 %   Platelets 141 (L) 150 - 400 K/uL   nRBC 0.0 0.0 - 0.2 %    Comment: Performed at [facility], [place] [address], [place], [place] [address]  Basic metabolic  panel     Status: Abnormal   Collection Time: 04/29/22  4:31 AM  Result Value Ref Range   Sodium 137 135 - 145 mmol/L   Potassium 4.4 3.5 - 5.1 mmol/L   Chloride 108 98 - 111 mmol/L   CO2 24 22 - 32 mmol/L   Glucose, Bld 91 70 - 99 mg/dL    Comment: Glucose reference range applies only to samples taken after fasting for at least 8 hours.   BUN 11 8 - 23 mg/dL   Creatinine, Ser 0.94 0.61 - 1.24 mg/dL   Calcium 8.5 (L) 8.9 - 10.3 mg/dL   GFR, Estimated >60 >60 mL/min    Comment: (NOTE) Calculated using the CKD-EPI Creatinine Equation (2021)    Anion gap 5 5 - 15    Comment: Performed at [facility], [place] [address], [place], [place] [address]    MR HIP RIGHT W WO CONTRAST  Result Date: 04/29/2022 CLINICAL DATA:  Hip pain, infection suspected. Right buttock cellulitis on CT. EXAM: MRI OF THE RIGHT HIP WITHOUT AND WITH CONTRAST TECHNIQUE: Multiplanar, multisequence MR imaging was performed both before and after administration of intravenous contrast. CONTRAST:  6.20m GADAVIST GADOBUTROL 1 MMOL/ML IV SOLN COMPARISON:  Right femur CT same date. FINDINGS: Bones: No evidence of acute fracture, dislocation or femoral head osteonecrosis.  No evidence of osteomyelitis. There are mild sacroiliac degenerative changes bilaterally with subchondral cyst formation inferiorly in both iliac bones. No erosive changes or associated abnormal synovial enhancement. Articular cartilage and labrum Articular cartilage: No focal chondral defect or subchondral signal abnormality identified. Mild degenerative changes of both hips. Labrum: There is no gross labral tear or paralabral abnormality. Joint or bursal effusion Joint effusion: No significant hip joint effusion. Mild right hip synovial enhancement within physiologic limits. Bursae: No focal periarticular fluid collection or suspicious soft tissue enhancement. Muscles and tendons Muscles and tendons: The visualized gluteus, hamstring and iliopsoas  tendons appear normal. The piriformis muscles appear symmetric. Other findings Miscellaneous: Mild subcutaneous edema inferomedially in the right buttock without focal fluid collection or adjacent muscular involvement. No other inflammatory changes are identified. Small bilateral scrotal hydroceles are noted. IMPRESSION: 1. No evidence of acute fracture, septic arthritis or osteomyelitis. 2. Mild nonspecific subcutaneous inferomedially in the right buttock as seen on earlier CT. No focal fluid collection or suspicious soft tissue enhancement. 3. Mild sacroiliac and hip degenerative changes bilaterally. Electronically Signed   By: Richardean Sale M.D.   On: 04/29/2022 08:13   CT FEMUR RIGHT W CONTRAST  Result Date: 04/28/2022 CLINICAL DATA:  Soft tissue infection of the thigh suspected. EXAM: CT OF THE LOWER RIGHT EXTREMITY WITH CONTRAST TECHNIQUE: Multidetector CT imaging of the lower right extremity was performed according to the standard protocol following intravenous contrast administration from hip joint to below ankle. RADIATION DOSE REDUCTION: This exam was performed according to the departmental dose-optimization program which includes automated exposure control, adjustment of the mA and/or kV according to patient size and/or use of iterative reconstruction technique. CONTRAST:  157m OMNIPAQUE IOHEXOL 300 MG/ML  SOLN COMPARISON:  None Available. FINDINGS: Bones/Joint/Cartilage No fracture or dislocation. Normal alignment. No joint effusion. No cortical erosion or periosteal reaction. Ligaments Ligaments are suboptimally evaluated by CT. Muscles and Tendons Muscles of the gluteal region, flexor, extensor and adductor compartment of the thigh are within normal limits. Muscles of the flexor, extensor and peroneal compartments of the leg are also within normal limits. No intramuscular fluid collection or abscess. Tendons about the hip joint are intact. Quadriceps and patellar tendon are intact. No evidence  of tendon tear. Tendons Achilles tendon is intact. Flexor, extensor and peroneal tendons of the leg are also within normal limits. Soft tissue Mild subcutaneous soft tissue edema in the posterior high medial thigh at the level of the gluteus maximus muscle measuring approximately 4.6 x 1.8 by 1.4 cm. Mild subcutaneous soft tissue edema about the knee joint. Subcutaneous soft tissue edema extends along the lateral compartment of the leg without evidence of drainable fluid collection or abscess. No soft tissue mass. Vessels: There is a stent graft in the superficial femoral artery. Atherosclerotic calcification of the popliteal artery and branch vessels of the leg without evidence of significant stenosis. IMPRESSION: 1.  No acute osseous abnormality. 2. No intramuscular hematoma or fluid collection. Tendons are intact. 3. Skin thickening and focal soft tissue edema about the posterior/medial aspect of the thigh at the level of the gluteus maximus muscle measuring approximately 4.6 x 1.8 x 1.4 cm suggesting mild cellulitis. No definite drainable fluid collection or abscess. 4. Mild generalized subcutaneous soft tissue edema about the knee and lateral aspect of the leg without evidence of fluid collection or abscess. Electronically Signed   By: IKeane PoliceD.O.   On: 04/28/2022 17:13   CT KNEE RIGHT W CONTRAST  Result Date: 04/28/2022 CLINICAL  DATA:  Soft tissue infection of the thigh suspected. EXAM: CT OF THE LOWER RIGHT EXTREMITY WITH CONTRAST TECHNIQUE: Multidetector CT imaging of the lower right extremity was performed according to the standard protocol following intravenous contrast administration from hip joint to below ankle. RADIATION DOSE REDUCTION: This exam was performed according to the departmental dose-optimization program which includes automated exposure control, adjustment of the mA and/or kV according to patient size and/or use of iterative reconstruction technique. CONTRAST:  170m OMNIPAQUE  IOHEXOL 300 MG/ML  SOLN COMPARISON:  None Available. FINDINGS: Bones/Joint/Cartilage No fracture or dislocation. Normal alignment. No joint effusion. No cortical erosion or periosteal reaction. Ligaments Ligaments are suboptimally evaluated by CT. Muscles and Tendons Muscles of the gluteal region, flexor, extensor and adductor compartment of the thigh are within normal limits. Muscles of the flexor, extensor and peroneal compartments of the leg are also within normal limits. No intramuscular fluid collection or abscess. Tendons about the hip joint are intact. Quadriceps and patellar tendon are intact. No evidence of tendon tear. Tendons Achilles tendon is intact. Flexor, extensor and peroneal tendons of the leg are also within normal limits. Soft tissue Mild subcutaneous soft tissue edema in the posterior high medial thigh at the level of the gluteus maximus muscle measuring approximately 4.6 x 1.8 by 1.4 cm. Mild subcutaneous soft tissue edema about the knee joint. Subcutaneous soft tissue edema extends along the lateral compartment of the leg without evidence of drainable fluid collection or abscess. No soft tissue mass. Vessels: There is a stent graft in the superficial femoral artery. Atherosclerotic calcification of the popliteal artery and branch vessels of the leg without evidence of significant stenosis. IMPRESSION: 1.  No acute osseous abnormality. 2. No intramuscular hematoma or fluid collection. Tendons are intact. 3. Skin thickening and focal soft tissue edema about the posterior/medial aspect of the thigh at the level of the gluteus maximus muscle measuring approximately 4.6 x 1.8 x 1.4 cm suggesting mild cellulitis. No definite drainable fluid collection or abscess. 4. Mild generalized subcutaneous soft tissue edema about the knee and lateral aspect of the leg without evidence of fluid collection or abscess. Electronically Signed   By: IKeane PoliceD.O.   On: 04/28/2022 17:13   CT TIBIA FIBULA RIGHT  W CONTRAST  Result Date: 04/28/2022 CLINICAL DATA:  Soft tissue infection of the thigh suspected. EXAM: CT OF THE LOWER RIGHT EXTREMITY WITH CONTRAST TECHNIQUE: Multidetector CT imaging of the lower right extremity was performed according to the standard protocol following intravenous contrast administration from hip joint to below ankle. RADIATION DOSE REDUCTION: This exam was performed according to the departmental dose-optimization program which includes automated exposure control, adjustment of the mA and/or kV according to patient size and/or use of iterative reconstruction technique. CONTRAST:  1080mOMNIPAQUE IOHEXOL 300 MG/ML  SOLN COMPARISON:  None Available. FINDINGS: Bones/Joint/Cartilage No fracture or dislocation. Normal alignment. No joint effusion. No cortical erosion or periosteal reaction. Ligaments Ligaments are suboptimally evaluated by CT. Muscles and Tendons Muscles of the gluteal region, flexor, extensor and adductor compartment of the thigh are within normal limits. Muscles of the flexor, extensor and peroneal compartments of the leg are also within normal limits. No intramuscular fluid collection or abscess. Tendons about the hip joint are intact. Quadriceps and patellar tendon are intact. No evidence of tendon tear. Tendons Achilles tendon is intact. Flexor, extensor and peroneal tendons of the leg are also within normal limits. Soft tissue Mild subcutaneous soft tissue edema in the posterior high medial thigh at  the level of the gluteus maximus muscle measuring approximately 4.6 x 1.8 by 1.4 cm. Mild subcutaneous soft tissue edema about the knee joint. Subcutaneous soft tissue edema extends along the lateral compartment of the leg without evidence of drainable fluid collection or abscess. No soft tissue mass. Vessels: There is a stent graft in the superficial femoral artery. Atherosclerotic calcification of the popliteal artery and branch vessels of the leg without evidence of significant  stenosis. IMPRESSION: 1.  No acute osseous abnormality. 2. No intramuscular hematoma or fluid collection. Tendons are intact. 3. Skin thickening and focal soft tissue edema about the posterior/medial aspect of the thigh at the level of the gluteus maximus muscle measuring approximately 4.6 x 1.8 x 1.4 cm suggesting mild cellulitis. No definite drainable fluid collection or abscess. 4. Mild generalized subcutaneous soft tissue edema about the knee and lateral aspect of the leg without evidence of fluid collection or abscess. Electronically Signed   By: Keane Police D.O.   On: 04/28/2022 17:13    ROS:  as above.  10 system review o/w neGATIVE. PE:  Blood pressure (!) 121/30, pulse (!) 56, temperature 97.6 F (36.4 C), temperature source Oral, resp. rate 17, height 5' 11"  (1.803 m), weight 65 kg, SpO2 100 %. 19 wd male in n ad.  A and O.  EOMI.  Resp unlabored.  R knee with small ulcer at the superior pre patellar area.  Localized erythema and swelling with tenderness around the ulcer.  The ulcer is about 1 cm across.  I expressed a scant amount of seropurulent drainage with palpation.  No fluctuance.  No effusion.  No pain with ROM of the knee.  No lymphadenopathy or lymphangiitis.  5/5 strength at the quad and hamstring.  Palpable pulses in the foot.  Assessment/Plan: R knee cellulitis - the patient reports improvement in pain, swelling and redness since being on IV abx overnight.  The superficial ulcer is open and draining.  There is no evidence of pyarthrosis or prepatellar septic bursitis.  I don't believe there is a surgical indication.  I wrote orders for wound care and spoke to his nurse.  He can transition to oral abx upon discharge.  F/u with his PCP in a couple of weeks if his sxs don't improve or with me if he doesn't have a PCP.  The proximal thigh "mass" seen on CT is localized swelling on MRI.  There is no evidence of mass or abscess at the proximal thigh.  The area of swelling and rash at  the base of his penis is best assessed by urology.  I'll sign off.  If there are further concerns please re consult.  Martin [name] 04/29/2022, 9:05 AM

## 2022-04-29 NOTE — TOC Progression Note (Signed)
Transition of Care (TOC) - Progression Note   Transition of Care (TOC) Screening Note  Patient Details  Name: Martin [name] Date of Birth: [birth year]  Transition of Care [facility]) CM/SW Contact:    Sherie Don, LCSW Phone Number: 04/29/2022, 11:21 AM  Transition of Care Department [facility]) has reviewed patient and no TOC needs have been identified at this time. We will continue to monitor patient advancement through interdisciplinary progression rounds. If new patient transition needs arise, please place a TOC consult.  Barriers to Discharge: Continued Medical Work up  Readmission Risk Interventions    04/29/2022   11:20 AM  Readmission Risk Prevention Plan  Transportation Screening Complete  Medication Review [employer]) Complete  HRI or [place] Complete  SW Recovery Care/Counseling Consult Complete  Palliative Care Screening Not [place] Not Applicable

## 2022-04-30 DIAGNOSIS — D649 Anemia, unspecified: Secondary | ICD-10-CM | POA: Diagnosis not present

## 2022-04-30 DIAGNOSIS — J439 Emphysema, unspecified: Secondary | ICD-10-CM

## 2022-04-30 DIAGNOSIS — L03115 Cellulitis of right lower limb: Secondary | ICD-10-CM | POA: Diagnosis not present

## 2022-04-30 DIAGNOSIS — E43 Unspecified severe protein-calorie malnutrition: Secondary | ICD-10-CM

## 2022-04-30 DIAGNOSIS — G894 Chronic pain syndrome: Secondary | ICD-10-CM | POA: Diagnosis not present

## 2022-04-30 DIAGNOSIS — I739 Peripheral vascular disease, unspecified: Secondary | ICD-10-CM

## 2022-04-30 DIAGNOSIS — Z720 Tobacco use: Secondary | ICD-10-CM

## 2022-04-30 LAB — CBC
HCT: 30.8 % — ABNORMAL LOW (ref 39.0–52.0)
Hemoglobin: 9.7 g/dL — ABNORMAL LOW (ref 13.0–17.0)
MCH: 28.6 pg (ref 26.0–34.0)
MCHC: 31.5 g/dL (ref 30.0–36.0)
MCV: 90.9 fL (ref 80.0–100.0)
Platelets: 153 10*3/uL (ref 150–400)
RBC: 3.39 MIL/uL — ABNORMAL LOW (ref 4.22–5.81)
RDW: 15.9 % — ABNORMAL HIGH (ref 11.5–15.5)
WBC: 8.9 10*3/uL (ref 4.0–10.5)
nRBC: 0 % (ref 0.0–0.2)

## 2022-04-30 LAB — FERRITIN: Ferritin: 11 ng/mL — ABNORMAL LOW (ref 24–336)

## 2022-04-30 LAB — IRON AND TIBC
Iron: 38 ug/dL — ABNORMAL LOW (ref 45–182)
Saturation Ratios: 10 % — ABNORMAL LOW (ref 17.9–39.5)
TIBC: 378 ug/dL (ref 250–450)
UIBC: 340 ug/dL

## 2022-04-30 LAB — VITAMIN B12: Vitamin B-12: 187 pg/mL (ref 180–914)

## 2022-04-30 LAB — BASIC METABOLIC PANEL
Anion gap: 5 (ref 5–15)
BUN: 15 mg/dL (ref 8–23)
CO2: 25 mmol/L (ref 22–32)
Calcium: 9.1 mg/dL (ref 8.9–10.3)
Chloride: 110 mmol/L (ref 98–111)
Creatinine, Ser: 0.89 mg/dL (ref 0.61–1.24)
GFR, Estimated: >60 mL/min (ref >60)
Glucose, Bld: 100 mg/dL — ABNORMAL HIGH (ref 70–99)
Potassium: 4.9 mmol/L (ref 3.5–5.1)
Sodium: 140 mmol/L (ref 135–145)

## 2022-04-30 LAB — FOLATE: Folate: 11.8 ng/mL (ref >5.9)

## 2022-04-30 LAB — VITAMIN D 25 HYDROXY (VIT D DEFICIENCY, FRACTURES): Vit D, 25-Hydroxy: 26.09 ng/mL — ABNORMAL LOW (ref 30–100)

## 2022-04-30 LAB — RETICULOCYTES
Immature Retic Fract: 10.3 % (ref 2.3–15.9)
RBC.: 3.42 MIL/uL — ABNORMAL LOW (ref 4.22–5.81)
Retic Count, Absolute: 54.4 10*3/uL (ref 19.0–186.0)
Retic Ct Pct: 1.6 % (ref 0.4–3.1)

## 2022-04-30 NOTE — Progress Notes (Signed)
Mobility Specialist - Progress Note  04/30/22 0952  Mobility  HOB Elevated/Bed Position Self regulated  Activity Ambulated independently in hallway  Range of Motion/Exercises Active  Level of Assistance Independent  Assistive Device None  Distance Ambulated (ft) 250 ft  Activity Response Tolerated well  Transport method Ambulatory  $Mobility charge 1 Mobility   Pt received in bed and agreeable to mobility. Pt to bed after session with all needs met.    [facility]

## 2022-04-30 NOTE — Progress Notes (Addendum)
PROGRESS NOTE    Martin [name]  UEA:[id number] DOB: 04-Mar-1961 DOA: 04/28/2022 PCP: Camillia Herter, NP   Brief Narrative:  Martin [name] is a 60 y.o. male with medical history significant for COPD, PAD s/p LLE angioplasty and stenting (left superficial femoral and popliteal arteries, left anterior tibial) 01/14/2022 on Eliquis and Plavix, history of CVA, anemia, history of anal cancer (s/p excision, chemotherapy, radiation), chronic pain on methadone, tobacco use as well as other comorbidities who is admitted with cellulitis of the right lower extremity and abscess of the right knee.  He was also found to have a boil at the base of his penis on the left side so urology been consulted.  Orthopedic surgery was consulted for his right knee cellulitis and he had improvement in his pain, redness and swelling on IV antibiotics.  Orthopedic surgery recommends continuing IV antibiotics and they do not feel any evidence of high arthrosis or prepatellar septic bursitis and they do not feel that surgical intervention is necessary.  They are recommending transitioning to oral antibiotics upon discharge.  Assessment and Plan: * Cellulitis of right lower extremity -Presenting with right knee cellulitis/superficial abscess as well as concern for right upper posteromedial thigh cellulitis.   -CT imaging without evidence of fluid collections or abscess. -S/p I&D of right knee abscess by EDP -Continue empiric IV vancomycin and ceftriaxone -Follow-up MRI to assess for myositis or deep infection and it showed "No evidence of acute fracture, septic arthritis or osteomyelitis. Mild nonspecific subcutaneous inferomedially in the right buttock as seen on earlier CT. No focal fluid collection or suspicious soft tissue enhancement. Mild sacroiliac and hip degenerative changes bilaterally" -Orthopedic surgery was consulted and Dr. Doran Durand reviewed his case and evaluated patient and patient reported improvement in pain,  swelling and redness since being on antibiotics and he has a superficial ulcer that was open and draining.  Dr. He would believe there is no evidence of pyarthrosis or prepatellar septic bursitis and does not feel that surgical intervention is indicated at this time; Dr. Doran Durand has written orders for wound care and recommends transitioning to oral antibiotics at discharge and following up with PCP within a few weeks and if he does not have a PCP with Dr. Doran Durand within a few weeks -WBC has now normalized and went from 11.7 and trended down to 11.3 yesterday and today is 8.9 -Continuing IV vancomycin and IV ceftriaxone and will de-escalate as necessary  Peripheral Vascular Disease -S/p LLE angioplasty and stenting (left superficial femoral and popliteal arteries, left anterior tibial) on Eliquis and Plavix. -His Eliquis and Plavix were resumed yesterday given no surgical intervention is needed by orthopedic surgery; -Urology has been consulted for his furuncle and possible abscess and will defer to them if this needs to be held  COPD (chronic obstructive pulmonary disease) ([place]) -Continues to have mild wheezing; noted to have this on admission without hypoxia or respiratory distress. -Continue Breo and Combivent as needed -Continue with DuoNeb 3 MLS every 6 hours as needed for wheezing or shortness of breath -Continue monitor respiratory status carefully  Normocytic Anemia -Mixed anemia of chronic disease and B12 deficiency.  -Anemia panel done and showed an iron level of 38, UIBC 340, TIBC 378, saturation ratios of 10%, ferritin level 11%, folate level 11.8, vitamin B12 of 187 -Patient's hemoglobin/hematocrit went from 10.4/31.5 -> 9.6/29.5 -> 9.3/29.4 -> 9.7/30.8 -Continue to monitor for signs and symptoms bleeding; no overt bleeding noted -Repeat CBC in a.m.  Thrombocytopenia -Mild and improved -  Patient's platelet count went from 143 -> 168 -> 141 -> 153 -Continue to monitor for signs and  symptoms of bleeding and repeat CBC in a.m.  Tobacco use -Reports smoking 1 pack/day.   -Nicotine patch 21 mg provided.   -Smoking cessation counseling given.  Chronic pain syndrome -Continue home Methadone 85 mg daily. -He has breakthrough pain with hydrocodone-acetaminophen 1 tab every 4 hours as needed for moderate-severe pain  BPH -Continue with Tamsulosin 0.4 mg p.o. nightly  Severe malnutrition in the context of chronic illness -Nutrition Status: Nutrition Problem: Severe Malnutrition Etiology: chronic illness (COPD, h/o anal cancer) Signs/Symptoms: energy intake < or equal to 75% for > or equal to 1 month, moderate fat depletion, severe muscle depletion Interventions: Ensure Enlive (each supplement provides 350kcal and 20 grams of protein), Liberalize Diet, MVI  DVT prophylaxis: apixaban (ELIQUIS) tablet 2.5 mg Start: 04/29/22 1500 SCDs Start: 04/28/22 2002 apixaban (ELIQUIS) tablet 2.5 mg    Code Status: Full Code Family Communication: No family currently at bedside  Disposition Plan:  Level of care: Med-Surg Status is: Inpatient Remains inpatient appropriate because: Needs further clinical improvement and further evaluation by urology    Consultants:  Orthopedic Surgery Urology  Procedures:  None  Antimicrobials:  Anti-infectives (From admission, onward)    Start     Dose/Rate Route Frequency Ordered Stop   04/29/22 1800  vancomycin (VANCOREADY) IVPB 1250 mg/250 mL        1,250 mg 166.7 mL/hr over 90 Minutes Intravenous Every 24 hours 04/28/22 2036     04/28/22 2145  cefTRIAXone (ROCEPHIN) 1 g in sodium chloride 0.9 % 100 mL IVPB        1 g 200 mL/hr over 30 Minutes Intravenous Every 24 hours 04/28/22 2132     04/28/22 1800  vancomycin (VANCOCIN) IVPB 1000 mg/200 mL premix        1,000 mg 200 mL/hr over 60 Minutes Intravenous  Once 04/28/22 1756 04/28/22 2014        Subjective: Seen and examined at bedside and thinks his knee is doing much better and  he is much more range of motion and states that the swelling and pain is improved significantly.  Feels okay.  Complaining of a boil at the base of his penis on the left side.  No nausea or vomiting.  Denies any other concerns or complaints at this time and states that he continues to smoke has been trying to quit and cut down.  Objective: Vitals:   04/29/22 1700 04/29/22 2219 04/30/22 0540 04/30/22 0744  BP: (!) 124/38 (!) 127/35 (!) 126/36   Pulse: 63 (!) 56 (!) 57   Resp: 18 16 17    Temp: 97.9 F (36.6 C) 98.1 F (36.7 C) 97.6 F (36.4 C)   TempSrc: Oral Oral Oral   SpO2: 100% 99% 100% 99%  Weight:      Height:        Intake/Output Summary (Last 24 hours) at 04/30/2022 1221 Last data filed at 04/30/2022 1016 Gross per 24 hour  Intake 1110 ml  Output 525 ml  Net 585 ml   Filed Weights   04/28/22 2315  Weight: 65 kg   Examination: Physical Exam:  Constitutional: Thin Caucasian male currently no acute distress appears calm Respiratory: Diminished to auscultation bilaterally with coarse breath sounds and has some expiratory wheezing noted., no rales, rhonchi or crackles. Normal respiratory effort and patient is not tachypenic. No accessory muscle use.  Unlabored breathing and not wearing supplemental oxygen via  nasal cannula Cardiovascular: RRR, no murmurs / rubs / gallops. S1 and S2 auscultated. No extremity edema.  Abdomen: Soft, non-tender, nondistended.  Bowel sounds positive.  GU: Has some erythema and furuncle at the left side base of his penis Musculoskeletal: No clubbing / cyanosis of digits/nails. No joint deformity upper and lower extremities.  Right knee is nonswollen and draining Skin: No rashes, lesions, ulcers. No induration; Warm and dry.  Neurologic: CN 2-12 grossly intact with no focal deficits. Romberg sign and cerebellar reflexes not assessed.  Psychiatric: Normal judgment and insight. Alert and oriented x 3. Normal mood and appropriate affect.   Data  Reviewed: I have personally reviewed following labs and imaging studies  CBC: Recent Labs  Lab 04/28/22 1257 04/29/22 0431 04/30/22 0515  WBC 11.7* 8.3 8.9  NEUTROABS 8.9*  --   --   HGB 9.6* 9.3* 9.7*  HCT 29.5* 29.4* 30.8*  MCV 89.7 90.7 90.9  PLT 168 141* 101   Basic Metabolic Panel: Recent Labs  Lab 04/28/22 1257 04/29/22 0431 04/30/22 0515  NA 132* 137 140  K 4.6 4.4 4.9  CL 107 108 110  CO2 21* 24 25  GLUCOSE 88 91 100*  BUN 13 11 15   CREATININE 1.02 0.94 0.89  CALCIUM 8.6* 8.5* 9.1   GFR: Estimated Creatinine Clearance: 78.1 mL/min (by C-G formula based on SCr of 0.89 mg/dL). Liver Function Tests: Recent Labs  Lab 04/28/22 1257  AST 19  ALT 17  ALKPHOS 93  BILITOT <0.1*  PROT 6.9  ALBUMIN 3.6   No results for input(s): "LIPASE", "AMYLASE" in the last 168 hours. No results for input(s): "AMMONIA" in the last 168 hours. Coagulation Profile: No results for input(s): "INR", "PROTIME" in the last 168 hours. Cardiac Enzymes: No results for input(s): "CKTOTAL", "CKMB", "CKMBINDEX", "TROPONINI" in the last 168 hours. BNP (last 3 results) No results for input(s): "PROBNP" in the last 8760 hours. HbA1C: No results for input(s): "HGBA1C" in the last 72 hours. CBG: No results for input(s): "GLUCAP" in the last 168 hours. Lipid Profile: No results for input(s): "CHOL", "HDL", "LDLCALC", "TRIG", "CHOLHDL", "LDLDIRECT" in the last 72 hours. Thyroid Function Tests: No results for input(s): "TSH", "T4TOTAL", "FREET4", "T3FREE", "THYROIDAB" in the last 72 hours. Anemia Panel: Recent Labs    04/30/22 0515  VITAMINB12 187  FOLATE 11.8  FERRITIN 11*  TIBC 378  IRON 38*  RETICCTPCT 1.6   Sepsis Labs: Recent Labs  Lab 04/28/22 1257 04/29/22 0431  LATICACIDVEN 0.9 0.8    No results found for this or any previous visit (from the past 240 hour(s)).   Radiology Studies: MR HIP RIGHT W WO CONTRAST  Result Date: 04/29/2022 CLINICAL DATA:  Hip pain,  infection suspected. Right buttock cellulitis on CT. EXAM: MRI OF THE RIGHT HIP WITHOUT AND WITH CONTRAST TECHNIQUE: Multiplanar, multisequence MR imaging was performed both before and after administration of intravenous contrast. CONTRAST:  6.12m GADAVIST GADOBUTROL 1 MMOL/ML IV SOLN COMPARISON:  Right femur CT same date. FINDINGS: Bones: No evidence of acute fracture, dislocation or femoral head osteonecrosis. No evidence of osteomyelitis. There are mild sacroiliac degenerative changes bilaterally with subchondral cyst formation inferiorly in both iliac bones. No erosive changes or associated abnormal synovial enhancement. Articular cartilage and labrum Articular cartilage: No focal chondral defect or subchondral signal abnormality identified. Mild degenerative changes of both hips. Labrum: There is no gross labral tear or paralabral abnormality. Joint or bursal effusion Joint effusion: No significant hip joint effusion. Mild right hip synovial enhancement  within physiologic limits. Bursae: No focal periarticular fluid collection or suspicious soft tissue enhancement. Muscles and tendons Muscles and tendons: The visualized gluteus, hamstring and iliopsoas tendons appear normal. The piriformis muscles appear symmetric. Other findings Miscellaneous: Mild subcutaneous edema inferomedially in the right buttock without focal fluid collection or adjacent muscular involvement. No other inflammatory changes are identified. Small bilateral scrotal hydroceles are noted. IMPRESSION: 1. No evidence of acute fracture, septic arthritis or osteomyelitis. 2. Mild nonspecific subcutaneous inferomedially in the right buttock as seen on earlier CT. No focal fluid collection or suspicious soft tissue enhancement. 3. Mild sacroiliac and hip degenerative changes bilaterally. Electronically Signed   By: Richardean Sale M.D.   On: 04/29/2022 08:13   CT FEMUR RIGHT W CONTRAST  Result Date: 04/28/2022 CLINICAL DATA:  Soft tissue  infection of the thigh suspected. EXAM: CT OF THE LOWER RIGHT EXTREMITY WITH CONTRAST TECHNIQUE: Multidetector CT imaging of the lower right extremity was performed according to the standard protocol following intravenous contrast administration from hip joint to below ankle. RADIATION DOSE REDUCTION: This exam was performed according to the departmental dose-optimization program which includes automated exposure control, adjustment of the mA and/or kV according to patient size and/or use of iterative reconstruction technique. CONTRAST:  150m OMNIPAQUE IOHEXOL 300 MG/ML  SOLN COMPARISON:  None Available. FINDINGS: Bones/Joint/Cartilage No fracture or dislocation. Normal alignment. No joint effusion. No cortical erosion or periosteal reaction. Ligaments Ligaments are suboptimally evaluated by CT. Muscles and Tendons Muscles of the gluteal region, flexor, extensor and adductor compartment of the thigh are within normal limits. Muscles of the flexor, extensor and peroneal compartments of the leg are also within normal limits. No intramuscular fluid collection or abscess. Tendons about the hip joint are intact. Quadriceps and patellar tendon are intact. No evidence of tendon tear. Tendons Achilles tendon is intact. Flexor, extensor and peroneal tendons of the leg are also within normal limits. Soft tissue Mild subcutaneous soft tissue edema in the posterior high medial thigh at the level of the gluteus maximus muscle measuring approximately 4.6 x 1.8 by 1.4 cm. Mild subcutaneous soft tissue edema about the knee joint. Subcutaneous soft tissue edema extends along the lateral compartment of the leg without evidence of drainable fluid collection or abscess. No soft tissue mass. Vessels: There is a stent graft in the superficial femoral artery. Atherosclerotic calcification of the popliteal artery and branch vessels of the leg without evidence of significant stenosis. IMPRESSION: 1.  No acute osseous abnormality. 2. No  intramuscular hematoma or fluid collection. Tendons are intact. 3. Skin thickening and focal soft tissue edema about the posterior/medial aspect of the thigh at the level of the gluteus maximus muscle measuring approximately 4.6 x 1.8 x 1.4 cm suggesting mild cellulitis. No definite drainable fluid collection or abscess. 4. Mild generalized subcutaneous soft tissue edema about the knee and lateral aspect of the leg without evidence of fluid collection or abscess. Electronically Signed   By: IKeane PoliceD.O.   On: 04/28/2022 17:13   CT KNEE RIGHT W CONTRAST  Result Date: 04/28/2022 CLINICAL DATA:  Soft tissue infection of the thigh suspected. EXAM: CT OF THE LOWER RIGHT EXTREMITY WITH CONTRAST TECHNIQUE: Multidetector CT imaging of the lower right extremity was performed according to the standard protocol following intravenous contrast administration from hip joint to below ankle. RADIATION DOSE REDUCTION: This exam was performed according to the departmental dose-optimization program which includes automated exposure control, adjustment of the mA and/or kV according to patient size and/or use of  iterative reconstruction technique. CONTRAST:  172m OMNIPAQUE IOHEXOL 300 MG/ML  SOLN COMPARISON:  None Available. FINDINGS: Bones/Joint/Cartilage No fracture or dislocation. Normal alignment. No joint effusion. No cortical erosion or periosteal reaction. Ligaments Ligaments are suboptimally evaluated by CT. Muscles and Tendons Muscles of the gluteal region, flexor, extensor and adductor compartment of the thigh are within normal limits. Muscles of the flexor, extensor and peroneal compartments of the leg are also within normal limits. No intramuscular fluid collection or abscess. Tendons about the hip joint are intact. Quadriceps and patellar tendon are intact. No evidence of tendon tear. Tendons Achilles tendon is intact. Flexor, extensor and peroneal tendons of the leg are also within normal limits. Soft tissue Mild  subcutaneous soft tissue edema in the posterior high medial thigh at the level of the gluteus maximus muscle measuring approximately 4.6 x 1.8 by 1.4 cm. Mild subcutaneous soft tissue edema about the knee joint. Subcutaneous soft tissue edema extends along the lateral compartment of the leg without evidence of drainable fluid collection or abscess. No soft tissue mass. Vessels: There is a stent graft in the superficial femoral artery. Atherosclerotic calcification of the popliteal artery and branch vessels of the leg without evidence of significant stenosis. IMPRESSION: 1.  No acute osseous abnormality. 2. No intramuscular hematoma or fluid collection. Tendons are intact. 3. Skin thickening and focal soft tissue edema about the posterior/medial aspect of the thigh at the level of the gluteus maximus muscle measuring approximately 4.6 x 1.8 x 1.4 cm suggesting mild cellulitis. No definite drainable fluid collection or abscess. 4. Mild generalized subcutaneous soft tissue edema about the knee and lateral aspect of the leg without evidence of fluid collection or abscess. Electronically Signed   By: IKeane PoliceD.O.   On: 04/28/2022 17:13   CT TIBIA FIBULA RIGHT W CONTRAST  Result Date: 04/28/2022 CLINICAL DATA:  Soft tissue infection of the thigh suspected. EXAM: CT OF THE LOWER RIGHT EXTREMITY WITH CONTRAST TECHNIQUE: Multidetector CT imaging of the lower right extremity was performed according to the standard protocol following intravenous contrast administration from hip joint to below ankle. RADIATION DOSE REDUCTION: This exam was performed according to the departmental dose-optimization program which includes automated exposure control, adjustment of the mA and/or kV according to patient size and/or use of iterative reconstruction technique. CONTRAST:  1047mOMNIPAQUE IOHEXOL 300 MG/ML  SOLN COMPARISON:  None Available. FINDINGS: Bones/Joint/Cartilage No fracture or dislocation. Normal alignment. No joint  effusion. No cortical erosion or periosteal reaction. Ligaments Ligaments are suboptimally evaluated by CT. Muscles and Tendons Muscles of the gluteal region, flexor, extensor and adductor compartment of the thigh are within normal limits. Muscles of the flexor, extensor and peroneal compartments of the leg are also within normal limits. No intramuscular fluid collection or abscess. Tendons about the hip joint are intact. Quadriceps and patellar tendon are intact. No evidence of tendon tear. Tendons Achilles tendon is intact. Flexor, extensor and peroneal tendons of the leg are also within normal limits. Soft tissue Mild subcutaneous soft tissue edema in the posterior high medial thigh at the level of the gluteus maximus muscle measuring approximately 4.6 x 1.8 by 1.4 cm. Mild subcutaneous soft tissue edema about the knee joint. Subcutaneous soft tissue edema extends along the lateral compartment of the leg without evidence of drainable fluid collection or abscess. No soft tissue mass. Vessels: There is a stent graft in the superficial femoral artery. Atherosclerotic calcification of the popliteal artery and branch vessels of the leg without evidence of significant  stenosis. IMPRESSION: 1.  No acute osseous abnormality. 2. No intramuscular hematoma or fluid collection. Tendons are intact. 3. Skin thickening and focal soft tissue edema about the posterior/medial aspect of the thigh at the level of the gluteus maximus muscle measuring approximately 4.6 x 1.8 x 1.4 cm suggesting mild cellulitis. No definite drainable fluid collection or abscess. 4. Mild generalized subcutaneous soft tissue edema about the knee and lateral aspect of the leg without evidence of fluid collection or abscess. Electronically Signed   By: Keane Police D.O.   On: 04/28/2022 17:13    Scheduled Meds:  apixaban  2.5 mg Oral BID   clopidogrel  75 mg Oral Daily   feeding supplement  237 mL Oral TID BM   fluocinonide cream   Topical BID    fluticasone furoate-vilanterol  1 puff Inhalation Daily   methadone  85 mg Oral Daily   multivitamin with minerals  1 tablet Oral Daily   nicotine  21 mg Transdermal Daily   tamsulosin  0.4 mg Oral QHS   Continuous Infusions:  cefTRIAXone (ROCEPHIN)  IV Stopped (04/29/22 2336)   vancomycin Stopped (04/29/22 2336)    LOS: 2 days   Raiford Noble, DO [facility] Available via Epic secure chat 7am-7pm After these hours, please refer to coverage provider listed on [email or web address] 04/30/2022, 12:21 PM

## 2022-04-30 NOTE — Consult Note (Signed)
Urology Consult  Referring physician: Pearletha Forge Reason for referral: boil on penis  Chief Complaint: boil on penis  History of Present Illness: Patient was admitted to the hospital with an infected right knee.  He was admitted with IV antibiotics.  He had right lower extremity cellulitis.  Orthopedics saw patient.  He was found to have a lesion on the base of the penis and warm compresses was applied  White blood count normal Blood culture negative  Patient voids with good flow.  Voids every 3 hours.  He has had surgery and radiation and may have had a skin rash near the scrotum before.  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   Peripheral vascular disease ([place])    Pneumonia    as child, cough at present with no fever   Pneumonia    as child, cough at present with no fever    Radiation 11/19/11-01/08/12   5040 cGy 28 fx Pelvis and inguinal area   Stroke ([name])    no weaknes or paralysis   Substance abuse [facility])    Past Surgical History:  Procedure Laterality Date   APPENDECTOMY     age 72 or 57   EXAMINATION UNDER ANESTHESIA  10/14/2011   Procedure: EXAM UNDER ANESTHESIA;  Surgeon: Earnstine Regal, MD;  Location: [facility];  Service: General;  Laterality: N/A;  exam under anethesia, Excision of mass anal canal, 1.5cm    INGUINAL HERNIA REPAIR  05/11/2012   Procedure: HERNIA REPAIR INGUINAL ADULT;  Surgeon: Earnstine Regal, MD;  Location: [place];  Service: General;  Laterality: Left;  left inguinal hernia repair with mesh   LOWER EXTREMITY ANGIOGRAPHY Left 07/02/2021   Procedure: LOWER EXTREMITY ANGIOGRAPHY;  Surgeon: Katha Cabal, MD;  Location: [place] [facility];  Service: Cardiovascular;  Laterality: Left;   LOWER EXTREMITY ANGIOGRAPHY Right 07/16/2021   Procedure: LOWER EXTREMITY ANGIOGRAPHY;  Surgeon: Katha Cabal, MD;  Location: [place] [facility];  Service: Cardiovascular;  Laterality: Right;   LOWER EXTREMITY ANGIOGRAPHY Left 08/27/2021   Procedure: LOWER EXTREMITY ANGIOGRAPHY;  Surgeon: Katha Cabal, MD;  Location: [place] [facility];  Service: Cardiovascular;  Laterality: Left;   LOWER EXTREMITY ANGIOGRAPHY Left 01/14/2022   Procedure: Lower Extremity Angiography;  Surgeon: Katha Cabal, MD;  Location: [place] [facility];  Service: Cardiovascular;  Laterality: Left;   SMALL INTESTINE SURGERY  04/19/2017   exploratory lap, partial small bowel resection for perforated ulcer/abcess, radiation enteritis   surgical pathology   10/14/2011   squamous cell ca of anus   TEE WITHOUT CARDIOVERSION N/A 10/16/2021   Procedure: TRANSESOPHAGEAL ECHOCARDIOGRAM (TEE);  Surgeon: Debara Pickett,  Nadean Corwin, MD;  Location: [facility];  Service: Cardiovascular;  Laterality: N/A;   TOOTH EXTRACTION N/A 12/04/2021   Procedure: DENTAL RESTORATION/EXTRACTIONS;  Surgeon: Diona Browner, DMD;  Location: [place];  Service: Oral Surgery;  Laterality: N/A;   transanal excision  10/14/2011   Dr.Todd Gerkin    Medications: I have reviewed the patient's current medications. Allergies:  Allergies  Allergen Reactions   Aspirin Swelling    Lip swelling   Yellow Jacket Venom [Bee Venom] Anaphylaxis   Codeine Nausea Only   Ibuprofen     On blood thinners   Morphine And Related Itching     Family History  Problem Relation Age of Onset   Colon cancer Father    Asthma Father    Cancer Father        prostate   Hypertension Father    Asthma Mother    Hypertension Mother    Stomach cancer Neg Hx    Esophageal cancer Neg Hx    Social History:  reports that he has been smoking cigarettes. He has a 47.00 pack-year smoking history. He has never used smokeless tobacco. He reports that he does not currently use alcohol. He reports that he does not currently use drugs after having used the following drugs: Marijuana and Cocaine.  ROS: All systems are reviewed and negative except as noted. Rest negative  Physical Exam:  Vital signs in last 24 hours: Temp:  [97.6 F (36.4 C)-98.1 F (36.7 C)] 97.6 F (36.4 C) (08/23 0540) Pulse Rate:  [56-63] 57 (08/23 0540) Resp:  [16-18] 17 (08/23 0540) BP: (124-127)/(35-38) 126/36 (08/23 0540) SpO2:  [99 %-100 %] 99 % (08/23 0744)  Cardiovascular: Skin warm; not flushed Respiratory: Breaths quiet; no shortness of breath Abdomen: No masses Neurological: Normal sensation to touch Musculoskeletal: Normal motor function arms and legs Lymphatics: No inguinal adenopathy Skin: No rashes Genitourinary: Patient has a hair follicle or boil that superficial with minimal redness and elevated approximately 1 cm in the left suprapubic area where the hair is.  It is away from the penis.  He and I both squeeze a little bit and there was a little bit of pus.  It opened up superficially.  It was more like an infected pimple.  I swabbed it and sent for culture.  Laboratory Data:  Results for orders placed or performed during the hospital encounter of 04/28/22 (from the past 72 hour(s))  CBC with Differential     Status: Abnormal   Collection Time: 04/28/22 12:57 PM  Result Value Ref Range   WBC 11.7 (H) 4.0 - 10.5 K/uL   RBC 3.29 (L) 4.22 - 5.81 MIL/uL   Hemoglobin 9.6 (L) 13.0 - 17.0 g/dL   HCT 29.5 (L) 39.0 - 52.0 %   MCV 89.7 80.0 - 100.0 fL    MCH 29.2 26.0 - 34.0 pg   MCHC 32.5 30.0 - 36.0 g/dL   RDW 15.9 (H) 11.5 - 15.5 %   Platelets 168 150 - 400 K/uL   nRBC 0.0 0.0 - 0.2 %   Neutrophils Relative % 76 %   Neutro Abs 8.9 (H) 1.7 - 7.7 K/uL   Lymphocytes Relative 16 %   Lymphs Abs 1.8 0.7 - 4.0 K/uL   Monocytes Relative 6 %   Monocytes Absolute 0.7 0.1 - 1.0 K/uL   Eosinophils Relative 1 %   Eosinophils Absolute 0.1 0.0 - 0.5 K/uL   Basophils Relative 1 %   Basophils Absolute 0.1 0.0 - 0.1 K/uL  Immature Granulocytes 0 %   Abs Immature Granulocytes 0.04 0.00 - 0.07 K/uL    Comment: Performed at [facility], [place] [address] [place], [place] [address]  Comprehensive metabolic panel     Status: Abnormal   Collection Time: 04/28/22 12:57 PM  Result Value Ref Range   Sodium 132 (L) 135 - 145 mmol/L   Potassium 4.6 3.5 - 5.1 mmol/L   Chloride 107 98 - 111 mmol/L   CO2 21 (L) 22 - 32 mmol/L   Glucose, Bld 88 70 - 99 mg/dL    Comment: Glucose reference range applies only to samples taken after fasting for at least 8 hours.   BUN 13 8 - 23 mg/dL   Creatinine, Ser 1.02 0.61 - 1.24 mg/dL   Calcium 8.6 (L) 8.9 - 10.3 mg/dL   Total Protein 6.9 6.5 - 8.1 g/dL   Albumin 3.6 3.5 - 5.0 g/dL   AST 19 15 - 41 U/L   ALT 17 0 - 44 U/L   Alkaline Phosphatase 93 38 - 126 U/L   Total Bilirubin <0.1 (L) 0.3 - 1.2 mg/dL   GFR, Estimated >60 >60 mL/min    Comment: (NOTE) Calculated using the CKD-EPI Creatinine Equation (2021)    Anion gap 4 (L) 5 - 15    Comment: Performed at [facility], [place] [address], [place], [place] [address]  Lactic acid, plasma     Status: None   Collection Time: 04/28/22 12:57 PM  Result Value Ref Range   Lactic Acid, Venous 0.9 0.5 - 1.9 mmol/L    Comment: Performed at [facility], [place] [address] [place], [place] [address]  Lactic acid, plasma     Status: None   Collection Time: 04/29/22  4:31 AM  Result Value Ref Range   Lactic Acid, Venous  0.8 0.5 - 1.9 mmol/L    Comment: Performed at [facility], [place] [address] [place], [place] [address]  CBC     Status: Abnormal   Collection Time: 04/29/22  4:31 AM  Result Value Ref Range   WBC 8.3 4.0 - 10.5 K/uL   RBC 3.24 (L) 4.22 - 5.81 MIL/uL   Hemoglobin 9.3 (L) 13.0 - 17.0 g/dL   HCT 29.4 (L) 39.0 - 52.0 %   MCV 90.7 80.0 - 100.0 fL   MCH 28.7 26.0 - 34.0 pg   MCHC 31.6 30.0 - 36.0 g/dL   RDW 15.9 (H) 11.5 - 15.5 %   Platelets 141 (L) 150 - 400 K/uL   nRBC 0.0 0.0 - 0.2 %    Comment: Performed at [facility], [place] [address], [place], [place] [address]  Basic metabolic panel     Status: Abnormal   Collection Time: 04/29/22  4:31 AM  Result Value Ref Range   Sodium 137 135 - 145 mmol/L   Potassium 4.4 3.5 - 5.1 mmol/L   Chloride 108 98 - 111 mmol/L   CO2 24 22 - 32 mmol/L   Glucose, Bld 91 70 - 99 mg/dL    Comment: Glucose reference range applies only to samples taken after fasting for at least 8 hours.   BUN 11 8 - 23 mg/dL   Creatinine, Ser 0.94 0.61 - 1.24 mg/dL   Calcium 8.5 (L) 8.9 - 10.3 mg/dL   GFR, Estimated >60 >60 mL/min    Comment: (NOTE) Calculated using the CKD-EPI Creatinine Equation (2021)    Anion gap 5 5 - 15  Comment: Performed at [facility], [place] [address] [place], [place] [address]  Vitamin B12     Status: None   Collection Time: 04/30/22  5:15 AM  Result Value Ref Range   Vitamin B-12 187 180 - 914 pg/mL    Comment: (NOTE) This assay is not validated for testing neonatal or myeloproliferative syndrome specimens for Vitamin B12 levels. Performed at [facility], [place] [address], [place], [place] [address]   Folate     Status: None   Collection Time: 04/30/22  5:15 AM  Result Value Ref Range   Folate 11.8 >5.9 ng/mL    Comment: Performed at [facility], [place] [address] [place], [place] [address]  Iron and TIBC     Status: Abnormal    Collection Time: 04/30/22  5:15 AM  Result Value Ref Range   Iron 38 (L) 45 - 182 ug/dL   TIBC 378 250 - 450 ug/dL   Saturation Ratios 10 (L) 17.9 - 39.5 %   UIBC 340 ug/dL    Comment: Performed at [facility], [place] [address], [place], [place] [address]  Ferritin     Status: Abnormal   Collection Time: 04/30/22  5:15 AM  Result Value Ref Range   Ferritin 11 (L) 24 - 336 ng/mL    Comment: Performed at [facility], [place] [address], [place], [place] [address]  Reticulocytes     Status: Abnormal   Collection Time: 04/30/22  5:15 AM  Result Value Ref Range   Retic Ct Pct 1.6 0.4 - 3.1 %   RBC. 3.42 (L) 4.22 - 5.81 MIL/uL   Retic Count, Absolute 54.4 19.0 - 186.0 K/uL   Immature Retic Fract 10.3 2.3 - 15.9 %    Comment: Performed at [facility], [place] [address] [place], [place] [address]  VITAMIN D 25 Hydroxy (Vit-D Deficiency, Fractures)     Status: Abnormal   Collection Time: 04/30/22  5:15 AM  Result Value Ref Range   Vit D, 25-Hydroxy 26.09 (L) 30 - 100 ng/mL    Comment: (NOTE) Vitamin D deficiency has been defined by the Institute of Medicine  and an Endocrine Society practice guideline as a level of serum 25-OH  vitamin D less than 20 ng/mL (1,2). The Endocrine Society went on to  further define vitamin D insufficiency as a level between 21 and 29  ng/mL (2).  1. IOM (Institute of Medicine). 2010. Dietary reference intakes for  calcium and D. [place]: The [employer]. 2. Holick MF, Binkley [place]ori-Carmen, Bischoff-Ferrari HA, et al. Evaluation,  treatment, and prevention of vitamin D deficiency: an Endocrine  Society clinical practice guideline, JCEM. 2011 Jul; 96(7): 1911-30.  Performed at [facility], [place] [address] [place], [place] [address]   CBC     Status: Abnormal   Collection Time: 04/30/22  5:15 AM  Result Value Ref Range   WBC 8.9 4.0 - 10.5 K/uL   RBC 3.39 (L) 4.22 - 5.81 MIL/uL    Hemoglobin 9.7 (L) 13.0 - 17.0 g/dL   HCT 30.8 (L) 39.0 - 52.0 %   MCV 90.9 80.0 - 100.0 fL   MCH 28.6 26.0 - 34.0 pg   MCHC 31.5 30.0 - 36.0 g/dL   RDW 15.9 (H) 11.5 - 15.5 %   Platelets 153 150 - 400 K/uL   nRBC 0.0 0.0 - 0.2 %    Comment: Performed at [facility],  [place] [address] [place], [place] [address]  Basic metabolic panel     Status: Abnormal   Collection Time: 04/30/22  5:15 AM  Result Value Ref Range   Sodium 140 135 - 145 mmol/L   Potassium 4.9 3.5 - 5.1 mmol/L   Chloride 110 98 - 111 mmol/L   CO2 25 22 - 32 mmol/L   Glucose, Bld 100 (H) 70 - 99 mg/dL    Comment: Glucose reference range applies only to samples taken after fasting for at least 8 hours.   BUN 15 8 - 23 mg/dL   Creatinine, Ser 0.89 0.61 - 1.24 mg/dL   Calcium 9.1 8.9 - 10.3 mg/dL   GFR, Estimated >60 >60 mL/min    Comment: (NOTE) Calculated using the CKD-EPI Creatinine Equation (2021)    Anion gap 5 5 - 15    Comment: Performed at [facility], [place] [address] [place], [place] [address]   No results found for this or any previous visit (from the past 240 hour(s)). Creatinine: Recent Labs    04/28/22 1257 04/29/22 0431 04/30/22 0515  CREATININE 1.02 0.94 0.89    Xrays: See report/chart None to review  Impression/Assessment:  In my opinion this patient has a superficial boil or hair follicle infected.  I would make sure he goes home with Keflex but possibly it would be better to send him home with doxycycline 100 mg twice a day for 7 days as long as he is not allergic to it.  He is to use warm compresses in the hospital and at home and he can express any pus as needed.  He agreed with this suggestion and is willing to do this  Plan:  I will see the patient as needed.  Warning signs of worsening suprapubic skin infection discussed.  Janson Lamar A Shauntell Iglesia 04/30/2022, 12:34 PM

## 2022-05-01 ENCOUNTER — Other Ambulatory Visit (HOSPITAL_COMMUNITY): Payer: Self-pay

## 2022-05-01 DIAGNOSIS — I739 Peripheral vascular disease, unspecified: Secondary | ICD-10-CM | POA: Diagnosis not present

## 2022-05-01 DIAGNOSIS — D649 Anemia, unspecified: Secondary | ICD-10-CM | POA: Diagnosis not present

## 2022-05-01 DIAGNOSIS — J439 Emphysema, unspecified: Secondary | ICD-10-CM | POA: Diagnosis not present

## 2022-05-01 DIAGNOSIS — L03115 Cellulitis of right lower limb: Secondary | ICD-10-CM | POA: Diagnosis not present

## 2022-05-01 MED ORDER — ONDANSETRON HCL 4 MG PO TABS
4.0000 mg | ORAL_TABLET | Freq: Four times a day (QID) | ORAL | 0 refills | Status: DC | PRN
  Filled 2022-05-01: qty 20, 30d supply, fill #0

## 2022-05-01 MED ORDER — ADULT MULTIVITAMIN W/MINERALS CH
1.0000 | ORAL_TABLET | Freq: Every day | ORAL | 0 refills | Status: DC
  Filled 2022-05-01: qty 30, 30d supply, fill #0

## 2022-05-01 MED ORDER — ENSURE ENLIVE PO LIQD
237.0000 mL | Freq: Three times a day (TID) | ORAL | 12 refills | Status: DC
  Filled 2022-05-01: qty 237, 1d supply, fill #0

## 2022-05-01 MED ORDER — SENNOSIDES-DOCUSATE SODIUM 8.6-50 MG PO TABS
1.0000 | ORAL_TABLET | Freq: Every evening | ORAL | 0 refills | Status: DC | PRN
  Filled 2022-05-01: qty 30, 30d supply, fill #0

## 2022-05-01 MED ORDER — FOLIC ACID 1 MG PO TABS: 1.0000 mg | ORAL_TABLET | Freq: Every day | ORAL | Status: DC

## 2022-05-01 MED ORDER — NICOTINE 21 MG/24HR TD PT24
21.0000 mg | MEDICATED_PATCH | Freq: Every day | TRANSDERMAL | 0 refills | Status: DC
  Filled 2022-05-01: qty 28, 28d supply, fill #0

## 2022-05-01 MED ORDER — EPINEPHRINE 0.3 MG/0.3ML IJ SOAJ
0.3000 mg | INTRAMUSCULAR | 0 refills | Status: DC | PRN
  Filled 2022-05-01: qty 2, 30d supply, fill #0

## 2022-05-01 MED ORDER — CEFADROXIL 500 MG PO CAPS
1000.0000 mg | ORAL_CAPSULE | Freq: Two times a day (BID) | ORAL | 0 refills | Status: AC
  Filled 2022-05-01: qty 28, 7d supply, fill #0

## 2022-05-01 MED ORDER — DOXYCYCLINE HYCLATE 100 MG PO CAPS
100.0000 mg | ORAL_CAPSULE | Freq: Two times a day (BID) | ORAL | 0 refills | Status: AC
  Filled 2022-05-01: qty 14, 7d supply, fill #0

## 2022-05-01 NOTE — Progress Notes (Signed)
Mobility Specialist Cancellation/Refusal Note:    05/01/22 0948  Mobility  Activity Refused mobility     Reason for Cancellation/Refusal: Pt declined mobility at this time. Pt mentioned walking IND. Will check back as schedule permits.    [facility]

## 2022-05-01 NOTE — Progress Notes (Signed)
Pharmacy Antibiotic Note  Martin [name] is a 61 y.o. male admitted on 04/28/2022 with worsening knee pain and soft tissue edema. Patient was taking doxycycline PTA after cutting his knee on a piece of metal ~1 week ago. Pharmacy has been consulted for vancomycin dosing.  CT of the R femur, knee, tibia/fibula showed skin thickening and focal soft tissue swelling concerning for cellulitis. No drainable abscess or fluid collection. I&D performed in the ED w/ purulent drainage noted.   Today, 05/01/22 WBC WNL Renal function stable, WNL Day #4 IV antibiotics on ceftriaxone and vancomycin  Plan: Continue vancomycin 1250 mg IV q24h for estimated AUC of 430 Goal vancomycin AUC 400-550. Check levels at steady state as needed Monitor renal function  Height: 5' 11"  (180.3 cm) Weight: 65 kg (143 lb 4.8 oz) IBW/kg (Calculated) : 75.3  Temp (24hrs), Avg:97.8 F (36.6 C), Min:97.3 F (36.3 C), Max:98.2 F (36.8 C)  Recent Labs  Lab 04/28/22 1257 04/29/22 0431 04/30/22 0515  WBC 11.7* 8.3 8.9  CREATININE 1.02 0.94 0.89  LATICACIDVEN 0.9 0.8  --      Estimated Creatinine Clearance: 78.1 mL/min (by C-G formula based on SCr of 0.89 mg/dL).    Allergies  Allergen Reactions   Aspirin Swelling    Lip swelling   Yellow Jacket Venom [Bee Venom] Anaphylaxis   Codeine Nausea Only   Ibuprofen     On blood thinners   Morphine And Related Itching    Antimicrobials this admission: Vancomycin 8/21 >>  Ceftriaxone 8/22 >>  Lenis Noon, PharmD 05/01/22 10:07 AM

## 2022-05-01 NOTE — Discharge Summary (Signed)
Physician Discharge Summary   Patient: Martin [name] MRN: [id number] DOB: 03/04/1961  Admit date:     04/28/2022  Discharge date: 05/01/22  Discharge Physician: Raiford Noble, DO   PCP: Camillia Herter, NP   Recommendations at discharge:   Follow up with PCP within 1-2 weeks and repeat CBC, CMP, Mag, Phos within 1 week Follow up with Orthopedic Surgery Dr. Doran Durand within 1-2 weeks Follow up with Urology Dr. Matilde Sprang within 1-2 weeks   Discharge Diagnoses: Principal Problem:   Cellulitis of right lower extremity Active Problems:   Peripheral vascular disease   COPD (chronic obstructive pulmonary disease) (HCC)   Chronic pain syndrome   Tobacco use   Normocytic anemia   Protein-calorie malnutrition, severe  Resolved Problems:   * No resolved hospital problems. *  Hospital Course: Martin [name] is a 61 y.o. male with medical history significant for COPD, PAD s/p LLE angioplasty and stenting (left superficial femoral and popliteal arteries, left anterior tibial) 01/14/2022 on Eliquis and Plavix, history of CVA, anemia, history of anal cancer (s/p excision, chemotherapy, radiation), chronic pain on methadone, tobacco use as well as other comorbidities who is admitted with cellulitis of the right lower extremity and abscess of the right knee.  He was also found to have a boil at the base of his penis on the left side so urology been consulted.  Orthopedic surgery was consulted for his right knee cellulitis and he had improvement in his pain, redness and swelling on IV antibiotics.  Orthopedic surgery recommends continuing IV antibiotics and they do not feel any evidence of high arthrosis or prepatellar septic bursitis and they do not feel that surgical intervention is necessary.  They are recommending transitioning to oral antibiotics upon discharge and he is stable from their perspective.  Urology evaluated and felt no surgical intervention was needed for his superficial boil or infected  hair follicle.  They are recommending antibiotics.  Patient was changed to p.o. cefadroxil and doxycycline for discharge and is stable for discharge at this time with close outpatient follow-up  Assessment and Plan: * Cellulitis of right lower extremity -Presenting with right knee cellulitis/superficial abscess as well as concern for right upper posteromedial thigh cellulitis.   -CT imaging without evidence of fluid collections or abscess. -S/p I&D of right knee abscess by EDP -Continue empiric IV vancomycin and ceftriaxone -Follow-up MRI to assess for myositis or deep infection and it showed "No evidence of acute fracture, septic arthritis or osteomyelitis. Mild nonspecific subcutaneous inferomedially in the right buttock as seen on earlier CT. No focal fluid collection or suspicious soft tissue enhancement. Mild sacroiliac and hip degenerative changes bilaterally" -Orthopedic surgery was consulted and Dr. Doran Durand reviewed his case and evaluated patient and patient reported improvement in pain, swelling and redness since being on antibiotics and he has a superficial ulcer that was open and draining.  Dr. He would believe there is no evidence of pyarthrosis or prepatellar septic bursitis and does not feel that surgical intervention is indicated at this time; Dr. Doran Durand has written orders for wound care and recommends transitioning to oral antibiotics at discharge and following up with PCP within a few weeks and if he does not have a PCP with Dr. Doran Durand within a few weeks -WBC has now normalized and went from 11.7 and trended down to 11.3 yesterday and 8.9 on last check -Continuing IV vancomycin and IV ceftriaxone and will de-escalate and change to p.o. cefadroxil and doxycycline   Peripheral Vascular Disease -S/p  LLE angioplasty and stenting (left superficial femoral and popliteal arteries, left anterior tibial) on Eliquis and Plavix. -His Eliquis and Plavix were resumed yesterday given no surgical  intervention is needed by orthopedic surgery; -Urology has been consulted for his furuncle and possible abscess and will defer to them if this needs to be held but they do not recommend any incision and drainage and recommending antibiotic and warm compresses for discharge   COPD (chronic obstructive pulmonary disease) ([place]) -Continues to have mild wheezing; noted to have this on admission without hypoxia or respiratory distress. -Continue Breo and Combivent as needed -Continue with DuoNeb 3 MLS every 6 hours as needed for wheezing or shortness of breath -Continue monitor respiratory status carefully   Normocytic Anemia -Mixed anemia of chronic disease and B12 deficiency.  -Anemia panel done and showed an iron level of 38, UIBC 340, TIBC 378, saturation ratios of 10%, ferritin level 11%, folate level 11.8, vitamin B12 of 187 -Patient's hemoglobin/hematocrit went from 10.4/31.5 -> 9.6/29.5 -> 9.3/29.4 -> 9.7/30.8 on last check -Continue to monitor for signs and symptoms bleeding; no overt bleeding noted -Repeat CBC in a.m.   Thrombocytopenia -Mild and improved -Patient's platelet count went from 143 -> 168 -> 141 -> 153 on last check -Continue to monitor for signs and symptoms of bleeding and repeat CBC in a.m.   Tobacco use -Reports smoking 1 pack/day.   -Nicotine patch 21 mg provided.   -Smoking cessation counseling given.   Chronic pain syndrome -Continue home Methadone 85 mg daily. -He has breakthrough pain with hydrocodone-acetaminophen 1 tab every 4 hours as needed for moderate-severe pain   BPH -Continue with Tamsulosin 0.4 mg p.o. nightly   Severe malnutrition in the context of chronic illness -Nutrition Status: Nutrition Problem: Severe Malnutrition Etiology: chronic illness (COPD, h/o anal cancer) Signs/Symptoms: energy intake < or equal to 75% for > or equal to 1 month, moderate fat depletion, severe muscle depletion Interventions: Ensure Enlive (each supplement  provides 350kcal and 20 grams of protein), Liberalize Diet, MVI Nutrition Documentation    Flowsheet Row ED to Hosp-Admission (Discharged) from 04/28/2022 in [facility]  Nutrition Problem Severe Malnutrition  Etiology chronic illness  [COPD, h/o anal cancer]  Nutrition Goal Patient will meet greater than or equal to 90% of their needs  Interventions Ensure Enlive (each supplement provides 350kcal and 20 grams of protein), Liberalize Diet, MVI       Consultants: Orthopedic surgery, urology Procedures performed: Incision and drainage of his knee by EDP Disposition: Home Diet recommendation:  Discharge Diet Orders (From admission, onward)     Start     Ordered   05/01/22 0000  Diet - low sodium heart healthy        05/01/22 1017           Cardiac diet DISCHARGE MEDICATION: Allergies as of 05/01/2022       Reactions   Aspirin Swelling   Lip swelling   Yellow Jacket Venom [bee Venom] Anaphylaxis   Codeine Nausea Only   Ibuprofen    On blood thinners   Morphine And Related Itching        Medication List     STOP taking these medications    methocarbamol 500 MG tablet Commonly known as: ROBAXIN   sulfamethoxazole-trimethoprim 800-160 MG tablet Commonly known as: BACTRIM DS       TAKE these medications    acetaminophen 500 MG tablet Commonly known as: TYLENOL Take 1,000 mg by mouth every 6 (six)  hours as needed for mild pain.   albuterol 108 (90 Base) MCG/ACT inhaler Commonly known as: VENTOLIN HFA Inhale 2 puffs into the lungs every 6 (six) hours as needed for wheezing or shortness of breath.   apixaban 2.5 MG Tabs tablet Commonly known as: Eliquis Take 1 tablet (2.5 mg total) by mouth 2 (two) times daily.   cefadroxil 500 MG capsule Commonly known as: DURICEF Take 2 capsules (1,000 mg total) by mouth 2 (two) times daily for 7 days.   clopidogrel 75 MG tablet Commonly known as: PLAVIX Take 1 tablet by mouth once daily   Combivent  Respimat 20-100 MCG/ACT Aers respimat Generic drug: Ipratropium-Albuterol Inhale 1 puff into the lungs every 6 (six) hours as needed for wheezing or shortness of breath.   doxycycline 100 MG capsule Commonly known as: VIBRAMYCIN Take 1 capsule (100 mg total) by mouth 2 (two) times daily for 7 days.   econazole nitrate 1 % cream Apply topically daily. What changed:  how much to take when to take this reasons to take this   EPINEPHrine 0.3 mg/0.3 mL Soaj injection Commonly known as: EpiPen 2-Pak Inject 0.3 mg (1 pen) into the muscle as needed for anaphylaxis.   feeding supplement Liqd Take 237 mLs by mouth 3 (three) times daily between meals.   fluticasone furoate-vilanterol 100-25 MCG/ACT Aepb Commonly known as: Breo Ellipta Inhale 1 puff into the lungs daily.   folic acid 1 MG tablet Commonly known as: FOLVITE Take 1 tablet (1 mg total) by mouth daily.   HYDROcodone-acetaminophen 5-325 MG tablet Commonly known as: NORCO/VICODIN Take 1 tablet by mouth every 4 (four) hours as needed for moderate pain or severe pain.   METHADONE HCL PO Take 85 mg by mouth daily.   multivitamin with minerals Tabs tablet Take 1 tablet by mouth daily.   nicotine 21 mg/24hr patch Commonly known as: NICODERM CQ - dosed in mg/24 hours Place 1 patch (21 mg total) onto the skin daily.   ondansetron 4 MG tablet Commonly known as: ZOFRAN Take 1 tablet (4 mg total) by mouth every 6 (six) hours as needed for nausea. What changed:  when to take this reasons to take this   senna-docusate 8.6-50 MG tablet Commonly known as: Senokot-S Take 1 tablet by mouth at bedtime as needed for mild constipation.   tamsulosin 0.4 MG Caps capsule Commonly known as: FLOMAX Take 1 capsule by mouth at bedtime               Discharge Care Instructions  (From admission, onward)           Start     Ordered   05/01/22 0000  Discharge wound care:       Comments: Per Orthopedic Surgery   05/01/22  1017            Discharge Exam: Filed Weights   04/28/22 2315  Weight: 65 kg   Vitals:   05/01/22 0546 05/01/22 0755  BP: (!) 124/37   Pulse: (!) 54   Resp: 18   Temp: (!) 97.3 F (36.3 C)   SpO2: 100% 99%   Examination: Physical Exam:  Constitutional: Thin Caucasian male in no acute distress appears calm and more comfortable Respiratory: Diminished to auscultation bilaterally with coarse breath sounds and has some slight wheezing.  No appreciable rales, rhonchi or crackles. Normal respiratory effort and patient is not tachypenic. No accessory muscle use.  Unlabored breathing Cardiovascular: RRR, no murmurs / rubs / gallops. S1 and S2  auscultated. No extremity edema. Abdomen: Soft, non-tender, non-distended.  Bowel sounds positive.  GU: Deferred. Musculoskeletal: No clubbing / cyanosis of digits/nails. No joint deformity upper and lower extremities and right knee is not swollen and draining Skin: The boil on his left base of his penis is improving Neurologic: CN 2-12 grossly intact with no focal deficits. Romberg sign and cerebellar reflexes not assessed.  Psychiatric: Normal judgment and insight. Alert and oriented x 3. Normal mood and appropriate affect.   Condition at discharge: stable  The results of significant diagnostics from this hospitalization (including imaging, microbiology, ancillary and laboratory) are listed below for reference.   Imaging Studies: MR HIP RIGHT W WO CONTRAST  Result Date: 04/29/2022 CLINICAL DATA:  Hip pain, infection suspected. Right buttock cellulitis on CT. EXAM: MRI OF THE RIGHT HIP WITHOUT AND WITH CONTRAST TECHNIQUE: Multiplanar, multisequence MR imaging was performed both before and after administration of intravenous contrast. CONTRAST:  6.44m GADAVIST GADOBUTROL 1 MMOL/ML IV SOLN COMPARISON:  Right femur CT same date. FINDINGS: Bones: No evidence of acute fracture, dislocation or femoral head osteonecrosis. No evidence of  osteomyelitis. There are mild sacroiliac degenerative changes bilaterally with subchondral cyst formation inferiorly in both iliac bones. No erosive changes or associated abnormal synovial enhancement. Articular cartilage and labrum Articular cartilage: No focal chondral defect or subchondral signal abnormality identified. Mild degenerative changes of both hips. Labrum: There is no gross labral tear or paralabral abnormality. Joint or bursal effusion Joint effusion: No significant hip joint effusion. Mild right hip synovial enhancement within physiologic limits. Bursae: No focal periarticular fluid collection or suspicious soft tissue enhancement. Muscles and tendons Muscles and tendons: The visualized gluteus, hamstring and iliopsoas tendons appear normal. The piriformis muscles appear symmetric. Other findings Miscellaneous: Mild subcutaneous edema inferomedially in the right buttock without focal fluid collection or adjacent muscular involvement. No other inflammatory changes are identified. Small bilateral scrotal hydroceles are noted. IMPRESSION: 1. No evidence of acute fracture, septic arthritis or osteomyelitis. 2. Mild nonspecific subcutaneous inferomedially in the right buttock as seen on earlier CT. No focal fluid collection or suspicious soft tissue enhancement. 3. Mild sacroiliac and hip degenerative changes bilaterally. Electronically Signed   By: WRichardean SaleM.D.   On: 04/29/2022 08:13   CT FEMUR RIGHT W CONTRAST  Result Date: 04/28/2022 CLINICAL DATA:  Soft tissue infection of the thigh suspected. EXAM: CT OF THE LOWER RIGHT EXTREMITY WITH CONTRAST TECHNIQUE: Multidetector CT imaging of the lower right extremity was performed according to the standard protocol following intravenous contrast administration from hip joint to below ankle. RADIATION DOSE REDUCTION: This exam was performed according to the departmental dose-optimization program which includes automated exposure control, adjustment  of the mA and/or kV according to patient size and/or use of iterative reconstruction technique. CONTRAST:  1068mOMNIPAQUE IOHEXOL 300 MG/ML  SOLN COMPARISON:  None Available. FINDINGS: Bones/Joint/Cartilage No fracture or dislocation. Normal alignment. No joint effusion. No cortical erosion or periosteal reaction. Ligaments Ligaments are suboptimally evaluated by CT. Muscles and Tendons Muscles of the gluteal region, flexor, extensor and adductor compartment of the thigh are within normal limits. Muscles of the flexor, extensor and peroneal compartments of the leg are also within normal limits. No intramuscular fluid collection or abscess. Tendons about the hip joint are intact. Quadriceps and patellar tendon are intact. No evidence of tendon tear. Tendons Achilles tendon is intact. Flexor, extensor and peroneal tendons of the leg are also within normal limits. Soft tissue Mild subcutaneous soft tissue edema in the posterior high  medial thigh at the level of the gluteus maximus muscle measuring approximately 4.6 x 1.8 by 1.4 cm. Mild subcutaneous soft tissue edema about the knee joint. Subcutaneous soft tissue edema extends along the lateral compartment of the leg without evidence of drainable fluid collection or abscess. No soft tissue mass. Vessels: There is a stent graft in the superficial femoral artery. Atherosclerotic calcification of the popliteal artery and branch vessels of the leg without evidence of significant stenosis. IMPRESSION: 1.  No acute osseous abnormality. 2. No intramuscular hematoma or fluid collection. Tendons are intact. 3. Skin thickening and focal soft tissue edema about the posterior/medial aspect of the thigh at the level of the gluteus maximus muscle measuring approximately 4.6 x 1.8 x 1.4 cm suggesting mild cellulitis. No definite drainable fluid collection or abscess. 4. Mild generalized subcutaneous soft tissue edema about the knee and lateral aspect of the leg without evidence of  fluid collection or abscess. Electronically Signed   By: Keane Police D.O.   On: 04/28/2022 17:13   CT KNEE RIGHT W CONTRAST  Result Date: 04/28/2022 CLINICAL DATA:  Soft tissue infection of the thigh suspected. EXAM: CT OF THE LOWER RIGHT EXTREMITY WITH CONTRAST TECHNIQUE: Multidetector CT imaging of the lower right extremity was performed according to the standard protocol following intravenous contrast administration from hip joint to below ankle. RADIATION DOSE REDUCTION: This exam was performed according to the departmental dose-optimization program which includes automated exposure control, adjustment of the mA and/or kV according to patient size and/or use of iterative reconstruction technique. CONTRAST:  126m OMNIPAQUE IOHEXOL 300 MG/ML  SOLN COMPARISON:  None Available. FINDINGS: Bones/Joint/Cartilage No fracture or dislocation. Normal alignment. No joint effusion. No cortical erosion or periosteal reaction. Ligaments Ligaments are suboptimally evaluated by CT. Muscles and Tendons Muscles of the gluteal region, flexor, extensor and adductor compartment of the thigh are within normal limits. Muscles of the flexor, extensor and peroneal compartments of the leg are also within normal limits. No intramuscular fluid collection or abscess. Tendons about the hip joint are intact. Quadriceps and patellar tendon are intact. No evidence of tendon tear. Tendons Achilles tendon is intact. Flexor, extensor and peroneal tendons of the leg are also within normal limits. Soft tissue Mild subcutaneous soft tissue edema in the posterior high medial thigh at the level of the gluteus maximus muscle measuring approximately 4.6 x 1.8 by 1.4 cm. Mild subcutaneous soft tissue edema about the knee joint. Subcutaneous soft tissue edema extends along the lateral compartment of the leg without evidence of drainable fluid collection or abscess. No soft tissue mass. Vessels: There is a stent graft in the superficial femoral artery.  Atherosclerotic calcification of the popliteal artery and branch vessels of the leg without evidence of significant stenosis. IMPRESSION: 1.  No acute osseous abnormality. 2. No intramuscular hematoma or fluid collection. Tendons are intact. 3. Skin thickening and focal soft tissue edema about the posterior/medial aspect of the thigh at the level of the gluteus maximus muscle measuring approximately 4.6 x 1.8 x 1.4 cm suggesting mild cellulitis. No definite drainable fluid collection or abscess. 4. Mild generalized subcutaneous soft tissue edema about the knee and lateral aspect of the leg without evidence of fluid collection or abscess. Electronically Signed   By: IKeane PoliceD.O.   On: 04/28/2022 17:13   CT TIBIA FIBULA RIGHT W CONTRAST  Result Date: 04/28/2022 CLINICAL DATA:  Soft tissue infection of the thigh suspected. EXAM: CT OF THE LOWER RIGHT EXTREMITY WITH CONTRAST TECHNIQUE: Multidetector CT  imaging of the lower right extremity was performed according to the standard protocol following intravenous contrast administration from hip joint to below ankle. RADIATION DOSE REDUCTION: This exam was performed according to the departmental dose-optimization program which includes automated exposure control, adjustment of the mA and/or kV according to patient size and/or use of iterative reconstruction technique. CONTRAST:  182m OMNIPAQUE IOHEXOL 300 MG/ML  SOLN COMPARISON:  None Available. FINDINGS: Bones/Joint/Cartilage No fracture or dislocation. Normal alignment. No joint effusion. No cortical erosion or periosteal reaction. Ligaments Ligaments are suboptimally evaluated by CT. Muscles and Tendons Muscles of the gluteal region, flexor, extensor and adductor compartment of the thigh are within normal limits. Muscles of the flexor, extensor and peroneal compartments of the leg are also within normal limits. No intramuscular fluid collection or abscess. Tendons about the hip joint are intact. Quadriceps and  patellar tendon are intact. No evidence of tendon tear. Tendons Achilles tendon is intact. Flexor, extensor and peroneal tendons of the leg are also within normal limits. Soft tissue Mild subcutaneous soft tissue edema in the posterior high medial thigh at the level of the gluteus maximus muscle measuring approximately 4.6 x 1.8 by 1.4 cm. Mild subcutaneous soft tissue edema about the knee joint. Subcutaneous soft tissue edema extends along the lateral compartment of the leg without evidence of drainable fluid collection or abscess. No soft tissue mass. Vessels: There is a stent graft in the superficial femoral artery. Atherosclerotic calcification of the popliteal artery and branch vessels of the leg without evidence of significant stenosis. IMPRESSION: 1.  No acute osseous abnormality. 2. No intramuscular hematoma or fluid collection. Tendons are intact. 3. Skin thickening and focal soft tissue edema about the posterior/medial aspect of the thigh at the level of the gluteus maximus muscle measuring approximately 4.6 x 1.8 x 1.4 cm suggesting mild cellulitis. No definite drainable fluid collection or abscess. 4. Mild generalized subcutaneous soft tissue edema about the knee and lateral aspect of the leg without evidence of fluid collection or abscess. Electronically Signed   By: IKeane PoliceD.O.   On: 04/28/2022 17:13   CT Angio Chest PE W and/or Wo Contrast  Result Date: 04/19/2022 CLINICAL DATA:  Hemoptysis EXAM: CT ANGIOGRAPHY CHEST WITH CONTRAST TECHNIQUE: Multidetector CT imaging of the chest was performed using the standard protocol during bolus administration of intravenous contrast. Multiplanar CT image reconstructions and MIPs were obtained to evaluate the vascular anatomy. RADIATION DOSE REDUCTION: This exam was performed according to the departmental dose-optimization program which includes automated exposure control, adjustment of the mA and/or kV according to patient size and/or use of iterative  reconstruction technique. CONTRAST:  875mOMNIPAQUE IOHEXOL 350 MG/ML SOLN COMPARISON:  Previous studies including the CT done on 072021-07-25nd chest radiograph done earlier today FINDINGS: Cardiovascular: Contrast density in the lumen of thoracic aorta is less than optimal. Scattered coronary artery calcifications are seen. There are no intraluminal filling defects in pulmonary artery branches. Mediastinum/Nodes: No significant lymphadenopathy seen. Lungs/Pleura: There are blebs and bullae in both apices and both lower lung fields. There is no focal pulmonary consolidation. Centrilobular emphysema is seen. There is no pleural effusion or pneumothorax. Upper Abdomen: There is a fluid-filled structure inseparable from the posterior margin of fundus of the stomach, possibly a diverticulum. Musculoskeletal: No acute findings are seen. Review of the MIP images confirms the above findings. IMPRESSION: There is no evidence of pulmonary artery embolism. There is no focal pulmonary consolidation. There are scattered coronary artery calcifications.  COPD. Other findings as  described in the body of the report. Electronically Signed   By: Elmer Picker M.D.   On: 04/19/2022 16:34   DG Chest 2 View  Result Date: 04/19/2022 CLINICAL DATA:  Shortness of breath, hemoptysis, cough EXAM: CHEST - 2 VIEW COMPARISON:  01/22/2022 FINDINGS: Cardiac size is within normal limits. Low position of diaphragms may suggest COPD. There are no signs of pulmonary edema or focal pulmonary consolidation. There is no pleural effusion or pneumothorax. IMPRESSION: There are no signs of pulmonary edema or focal pulmonary consolidation. Electronically Signed   By: Elmer Picker M.D.   On: 04/19/2022 15:38    Microbiology: Results for orders placed or performed during the hospital encounter of 04/28/22  Aerobic/Anaerobic Culture w Gram Stain (surgical/deep wound)     Status: None (Preliminary result)   Collection Time: 04/30/22  1:06  PM   Specimen: Wound  Result Value Ref Range Status   Specimen Description   Final    WOUND Performed at [facility] [address] [place], [place] [address]    Special Requests   Final    NONE CYST, LEFT Performed at [facility], [place] [address], [place], [place] [address]    Gram Stain   Final    FEW WBC PRESENT, PREDOMINANTLY PMN RARE GRAM POSITIVE COCCI IN PAIRS Performed at [facility], [place] [address], [place], [place] [address]    Culture FEW STAPHYLOCOCCUS AUREUS  Final   Report Status PENDING  Incomplete   Labs: CBC: Recent Labs  Lab 04/28/22 1257 04/29/22 0431 04/30/22 0515  WBC 11.7* 8.3 8.9  NEUTROABS 8.9*  --   --   HGB 9.6* 9.3* 9.7*  HCT 29.5* 29.4* 30.8*  MCV 89.7 90.7 90.9  PLT 168 141* 202   Basic Metabolic Panel: Recent Labs  Lab 04/28/22 1257 04/29/22 0431 04/30/22 0515  NA 132* 137 140  K 4.6 4.4 4.9  CL 107 108 110  CO2 21* 24 25  GLUCOSE 88 91 100*  BUN 13 11 15   CREATININE 1.02 0.94 0.89  CALCIUM 8.6* 8.5* 9.1   Liver Function Tests: Recent Labs  Lab 04/28/22 1257  AST 19  ALT 17  ALKPHOS 93  BILITOT <0.1*  PROT 6.9  ALBUMIN 3.6   CBG: No results for input(s): "GLUCAP" in the last 168 hours.  Discharge time spent: greater than 30 minutes.  Signed: Raiford Noble, DO [facility] 05/01/2022

## 2022-05-01 NOTE — Progress Notes (Signed)
All discharge instructions were given to the Pt, demonstrated wound care, all questions answered. Pt participated in the wound care. Supplies given.

## 2022-05-01 NOTE — TOC Transition Note (Signed)
Transition of Care [facility]) - CM/SW Discharge Note  Patient Details  Name: Martin [name] MRN: [id number] Date of Birth: 1961/06/13  Transition of Care [facility]) CM/SW Contact:  Sherie Don, LCSW Phone Number: 05/01/2022, 11:15 AM  Clinical Narrative: [facility] consulted for PCP assistance, but patient is insured. CSW spoke with patient and explained he will need to follow up with his insurance to see which physicians are in-network with his insurance and find out who is accepting new patients. TOC signing off.  Final next level of care: Home/Self Care Barriers to Discharge: Barriers Resolved  Patient Goals and CMS Choice Choice offered to / list presented to : NA  Discharge Plan and Services        DME Arranged: N/A DME Agency: NA  Readmission Risk Interventions    04/29/2022   11:20 AM  Readmission Risk Prevention Plan  Transportation Screening Complete  Medication Review ([place]) Complete  HRI or [place] Complete  SW Recovery Care/Counseling Consult Complete  Palliative Care Screening Not [place] Not Applicable

## 2022-05-02 ENCOUNTER — Telehealth: Payer: Self-pay | Admitting: *Deleted

## 2022-05-02 NOTE — Patient Outreach (Signed)
  Care Coordination [facility] Note Transition Care Management Follow-up Telephone Call Date of discharge and from where: 05/01/22 [facility] How have you been since you were released from the hospital? Patient states that his knee is looking and feeling better. Any questions or concerns? No  Items Reviewed: Did the pt receive and understand the discharge instructions provided? Yes  Medications obtained and verified? Yes  Other? No  Any new allergies since your discharge? No  Dietary orders reviewed? Yes Do you have support at home? Yes   Home Care and Equipment/Supplies: Were home health services ordered? no If so, what is the name of the agency? N/A  Has the agency set up a time to come to the patient's home? not applicable Were any new equipment or medical supplies ordered?  No What is the name of the medical supply agency? N/A Were you able to get the supplies/equipment? not applicable Do you have any questions related to the use of the equipment or supplies? No  Functional Questionnaire: (I = Independent and D = Dependent) ADLs: I  Bathing/Dressing- I  Meal Prep- I  Eating- I  Maintaining continence- I  Transferring/Ambulation- I  Managing Meds- I  Follow up appointments reviewed:  PCP Hospital f/u appt confirmed? No , nurse will inform [facility] f/u appt confirmed? No  . Are transportation arrangements needed? No  If their condition worsens, is the pt aware to call PCP or go to the Emergency Dept.? Yes Was the patient provided with contact information for the PCP's office or ED? Yes Was to pt encouraged to call back with questions or concerns? Yes  SDOH assessments and interventions completed:   Yes  Care Coordination Interventions Activated:  Yes   Care Coordination Interventions:  PCP follow up appointment requested    Encounter Outcome:  Pt. Visit Completed    Emelia Loron RN, BSN [place] [phone] [email or web address]

## 2022-05-05 DIAGNOSIS — F1120 Opioid dependence, uncomplicated: Secondary | ICD-10-CM | POA: Diagnosis not present

## 2022-05-05 LAB — AEROBIC/ANAEROBIC CULTURE W GRAM STAIN (SURGICAL/DEEP WOUND)

## 2022-05-12 DIAGNOSIS — F1120 Opioid dependence, uncomplicated: Secondary | ICD-10-CM | POA: Diagnosis not present

## 2022-05-13 ENCOUNTER — Other Ambulatory Visit: Payer: Self-pay | Admitting: Family

## 2022-05-13 DIAGNOSIS — N400 Enlarged prostate without lower urinary tract symptoms: Secondary | ICD-10-CM

## 2022-05-14 NOTE — Telephone Encounter (Signed)
Requested medication (s) are due for refill today - yes  Requested medication (s) are on the active medication list -yes  Future visit scheduled -no  Last refill: 02/11/22 #30 1RF  Notes to clinic: fails lab protocol- over 1 year-2019  Requested Prescriptions  Pending Prescriptions Disp Refills   tamsulosin (FLOMAX) 0.4 MG CAPS capsule [Pharmacy Med Name: Tamsulosin HCl 0.4 MG Oral Capsule] 30 capsule 0    Sig: Take 1 capsule by mouth at bedtime     Urology: Alpha-Adrenergic Blocker Failed - 05/13/2022 12:55 PM      Failed - PSA in normal range and within 360 days    Prostate Specific Ag, Serum  Date Value Ref Range Status  05/20/2018 <0.1 0.0 - 4.0 ng/mL Final    Comment:    (NOTE) Roche ECLIA methodology. According to the American Urological Association, Serum PSA should decrease and remain at undetectable levels after radical prostatectomy. The AUA defines biochemical recurrence as an initial PSA value 0.2 ng/mL or greater followed by a subsequent confirmatory PSA value 0.2 ng/mL or greater. Values obtained with different assay methods or kits cannot be used interchangeably. Results cannot be interpreted as absolute evidence of the presence or absence of malignant disease. Performed At: [facility] [place], [place] [id number] Rush Farmer MD [id number]          Failed - Last BP in normal range    BP Readings from Last 1 Encounters:  05/01/22 (!) 124/37         Passed - Valid encounter within last 12 months    Recent Outpatient Visits           10 months ago Preoperative clearance   Primary Care at [facility], Amy J, NP   11 months ago Medicare welcome visit   Primary Care at [facility], [place], NP   1 year ago Encounter to establish care   Primary Care at [facility], Amy J, NP                 Requested Prescriptions  Pending Prescriptions Disp Refills   tamsulosin (FLOMAX) 0.4 MG CAPS  capsule [Pharmacy Med Name: Tamsulosin HCl 0.4 MG Oral Capsule] 30 capsule 0    Sig: Take 1 capsule by mouth at bedtime     Urology: Alpha-Adrenergic Blocker Failed - 05/13/2022 12:55 PM      Failed - PSA in normal range and within 360 days    Prostate Specific Ag, Serum  Date Value Ref Range Status  05/20/2018 <0.1 0.0 - 4.0 ng/mL Final    Comment:    (NOTE) Roche ECLIA methodology. According to the American Urological Association, Serum PSA should decrease and remain at undetectable levels after radical prostatectomy. The AUA defines biochemical recurrence as an initial PSA value 0.2 ng/mL or greater followed by a subsequent confirmatory PSA value 0.2 ng/mL or greater. Values obtained with different assay methods or kits cannot be used interchangeably. Results cannot be interpreted as absolute evidence of the presence or absence of malignant disease. Performed At: [facility] [place], [place] [id number] Rush Farmer MD [id number]          Failed - Last BP in normal range    BP Readings from Last 1 Encounters:  05/01/22 (!) 124/37         Passed - Valid encounter within last 12 months    Recent Outpatient Visits  10 months ago Preoperative clearance   Primary Care at [facility], [place], NP   11 months ago Medicare welcome visit   Primary Care at [facility], [place], NP   1 year ago Encounter to establish care   Primary Care at [facility], Flonnie [place], NP

## 2022-05-14 NOTE — Telephone Encounter (Signed)
Order complete.

## 2022-05-19 DIAGNOSIS — F1120 Opioid dependence, uncomplicated: Secondary | ICD-10-CM | POA: Diagnosis not present

## 2022-05-26 DIAGNOSIS — F1120 Opioid dependence, uncomplicated: Secondary | ICD-10-CM | POA: Diagnosis not present

## 2022-05-27 ENCOUNTER — Ambulatory Visit
Admission: EM | Admit: 2022-05-27 | Discharge: 2022-05-27 | Disposition: A | Discharge status: Home / Self Care | Payer: Medicare HMO | Attending: Internal Medicine | Admitting: Internal Medicine

## 2022-05-27 ENCOUNTER — Ambulatory Visit (INDEPENDENT_AMBULATORY_CARE_PROVIDER_SITE_OTHER): Payer: Medicare HMO

## 2022-05-27 DIAGNOSIS — R059 Cough, unspecified: Secondary | ICD-10-CM

## 2022-05-27 DIAGNOSIS — J069 Acute upper respiratory infection, unspecified: Secondary | ICD-10-CM | POA: Diagnosis not present

## 2022-05-27 DIAGNOSIS — R0602 Shortness of breath: Secondary | ICD-10-CM

## 2022-05-27 DIAGNOSIS — Z20822 Contact with and (suspected) exposure to covid-19: Secondary | ICD-10-CM | POA: Insufficient documentation

## 2022-05-27 DIAGNOSIS — J441 Chronic obstructive pulmonary disease with (acute) exacerbation: Secondary | ICD-10-CM | POA: Diagnosis not present

## 2022-05-27 DIAGNOSIS — R6883 Chills (without fever): Secondary | ICD-10-CM | POA: Diagnosis not present

## 2022-05-27 MED ORDER — PREDNISONE 20 MG PO TABS: 20.0000 mg | ORAL_TABLET | Freq: Every day | ORAL | 0 refills | Status: AC

## 2022-05-27 MED ORDER — IPRATROPIUM-ALBUTEROL 0.5-2.5 (3) MG/3ML IN SOLN
3.0000 mL | Freq: Once | RESPIRATORY_TRACT | Status: AC
  Administered 2022-05-27: 3 mL via RESPIRATORY_TRACT

## 2022-05-27 MED ORDER — IPRATROPIUM-ALBUTEROL 0.5-2.5 (3) MG/3ML IN SOLN: 3.0000 mL | Freq: Four times a day (QID) | RESPIRATORY_TRACT | 0 refills | Status: DC | PRN

## 2022-05-27 MED ORDER — BENZONATATE 100 MG PO CAPS: 100.0000 mg | ORAL_CAPSULE | Freq: Three times a day (TID) | ORAL | 0 refills | Status: DC | PRN

## 2022-05-27 NOTE — ED Triage Notes (Signed)
Pt presents with body aches and chills, cough, congestion, fatigue and sob for 3 days. Pt has been taking otc cold and flu medications.

## 2022-05-27 NOTE — ED Provider Notes (Signed)
[name]    CSN: [id number] Arrival date & time: 05/27/22  1807      History   Chief Complaint Chief Complaint  Patient presents with   Shortness of Breath    Nasel congestion   chest congestion   Fatigue    HPI Martin [name] is a 61 y.o. male.   Patient presents with nasal congestion, generalized body aches and chills, productive cough, fatigue, intermittent shortness of breath that has been present for about 3 days.  Patient reports that he has COPD with baseline shortness of breath but that shortness of breath is worsened since other symptoms started.  Patient has taken over-the-counter cold and flu medication that he does not know the name of that has provided minimal improvement of symptoms.  Denies any known fevers at home.  Denies any known sick contacts.  Denies any chest pain.   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   Peripheral vascular disease ([place])    Pneumonia    as child, cough at present with no fever   Pneumonia    as child, cough at present with no fever    Radiation 11/19/11-01/08/12   5040 cGy 28 fx Pelvis and inguinal area   Stroke ([place])    no weaknes or paralysis   Substance abuse [facility])     Patient Active Problem List   Diagnosis Date Noted   Protein-calorie malnutrition, severe 04/29/2022   Cellulitis of right  lower extremity 04/28/2022   Sepsis 10/12/2021   Peripheral vascular disease    AKI    Tobacco use    COPD (chronic obstructive pulmonary disease) (HCC)    BPH     Periodontal disease    IVDU, cocaine use    Demand ischemia    Thrombocytopenia    Normocytic anemia    MSSA bacteremia 10/11/2021   Callus 06/12/2021   Hav (hallux abducto valgus), unspecified laterality 06/12/2021   Hammer toes, bilateral 06/12/2021   Atherosclerosis of native arteries of extremity with intermittent claudication ([place]) 06/12/2021   Hyperlipidemia 06/07/2021   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   Elevated blood-pressure reading, without diagnosis of hypertension 09/26/2020   Body mass index (BMI) 29.0-29.9, adult 08/29/2020   Partial small bowel obstruction ([place]) 03/10/2020   Small bowel obstruction due to adhesions ([place]) 03/08/2020   Enteritis 10/15/2019   Abnormal liver function 10/15/2019   Leukocytosis 10/15/2019   Nausea & vomiting 10/15/2019   GAD (generalized anxiety disorder) 05/18/2017   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   Constipation 04/11/2017   Bee sting 04/05/2017   Diverticulosis 11/29/2014   Healthcare maintenance 11/06/2014   Cough 10/05/2014   Insomnia 09/13/2014   Erectile dysfunction 11/30/2013   Chronic pain syndrome 11/30/2013  Depression 11/30/2013   GERD (gastroesophageal reflux disease)    Hypertension    Anxiety    Arthritis    Osteoarthritis of lumbar spine    Radiation    Night sweats 08/12/2012   Tobacco abuse counseling 05/26/2012   Lymphocytic colitis 05/03/2012   Vitamin B12 deficiency 03/19/2012   Folate deficiency 03/19/2012   Inguinal hernia 11/24/2011   Cancer ([place]) 10/14/2011   Internal hemorrhoid 09/11/2011   Anal cancer 10/09/2010    Past Surgical History:  Procedure Laterality Date   APPENDECTOMY     age 64 or 30   EXAMINATION UNDER ANESTHESIA  10/14/2011   Procedure: EXAM UNDER ANESTHESIA;  Surgeon: Earnstine Regal, MD;   Location: [facility];  Service: General;  Laterality: N/A;  exam under anethesia, Excision of mass anal canal, 1.5cm   INGUINAL HERNIA REPAIR  05/11/2012   Procedure: HERNIA REPAIR INGUINAL ADULT;  Surgeon: Earnstine Regal, MD;  Location: [place];  Service: General;  Laterality: Left;  left inguinal hernia repair with mesh   LOWER EXTREMITY ANGIOGRAPHY Left 07/02/2021   Procedure: LOWER EXTREMITY ANGIOGRAPHY;  Surgeon: Katha Cabal, MD;  Location: [place] [facility];  Service: Cardiovascular;  Laterality: Left;   LOWER EXTREMITY ANGIOGRAPHY Right 07/16/2021   Procedure: LOWER EXTREMITY ANGIOGRAPHY;  Surgeon: Katha Cabal, MD;  Location: [place] [facility];  Service: Cardiovascular;  Laterality: Right;   LOWER EXTREMITY ANGIOGRAPHY Left 08/27/2021   Procedure: LOWER EXTREMITY ANGIOGRAPHY;  Surgeon: Katha Cabal, MD;  Location: [place] [facility];  Service: Cardiovascular;  Laterality: Left;   LOWER EXTREMITY ANGIOGRAPHY Left 01/14/2022   Procedure: Lower Extremity Angiography;  Surgeon: Katha Cabal, MD;  Location: [place] [facility];  Service: Cardiovascular;  Laterality: Left;   SMALL INTESTINE SURGERY  04/19/2017   exploratory lap, partial small bowel resection for perforated ulcer/abcess, radiation enteritis   surgical pathology   10/14/2011   squamous cell ca of anus   TEE WITHOUT CARDIOVERSION N/A 10/16/2021   Procedure: TRANSESOPHAGEAL ECHOCARDIOGRAM (TEE);  Surgeon: [name], MD;  Location: [facility];  Service: Cardiovascular;  Laterality: N/A;   TOOTH EXTRACTION N/A 12/04/2021   Procedure: DENTAL RESTORATION/EXTRACTIONS;  Surgeon: Diona Browner, DMD;  Location: [place];  Service: Oral Surgery;  Laterality: N/A;   transanal excision  10/14/2011   Dr.Todd Gerkin       Home Medications    Prior to Admission medications   Medication Sig Start Date End Date Taking? Authorizing Provider  benzonatate (TESSALON) 100 MG capsule Take 1  capsule (100 mg total) by mouth every 8 (eight) hours as needed for cough. 05/27/22  Yes , Hildred Alamin E, FNP  ipratropium-albuterol (DUONEB) 0.5-2.5 (3) MG/3ML SOLN Take 3 mLs by nebulization every 6 (six) hours as needed. 05/27/22  Yes , Hildred Alamin E, FNP  predniSONE (DELTASONE) 20 MG tablet Take 1 tablet (20 mg total) by mouth daily for 5 days. 05/27/22 06/01/22 Yes , Michele Rockers, FNP  acetaminophen (TYLENOL) 500 MG tablet Take 1,000 mg by mouth every 6 (six) hours as needed for mild pain.    [provider]  albuterol (VENTOLIN HFA) 108 (90 Base) MCG/ACT inhaler Inhale 2 puffs into the lungs every 6 (six) hours as needed for wheezing or shortness of breath.    [provider]  apixaban (ELIQUIS) 2.5 MG TABS tablet Take 1 tablet (2.5 mg total) by mouth 2 (two) times daily. 01/14/22   Schnier, Dolores Lory, MD  clopidogrel (PLAVIX) 75 MG tablet  Take 1 tablet by mouth once daily 02/28/22   Schnier, Dolores Lory, MD  econazole nitrate 1 % cream Apply topically daily. Patient taking differently: Apply 1 Application topically daily as needed (rash). 01/17/22   Nyoka Lint, PA-C  EPINEPHrine (EPIPEN 2-PAK) 0.3 mg/0.3 mL IJ SOAJ injection Inject 0.3 mg (1 pen) into the muscle as needed for anaphylaxis. 05/01/22   Raiford Noble Latif, DO  feeding supplement (ENSURE ENLIVE / ENSURE PLUS) LIQD Take 237 mLs by mouth 3 (three) times daily between meals. 05/01/22   Sheikh, Omair Latif, DO  fluticasone furoate-vilanterol (BREO ELLIPTA) 100-25 MCG/ACT AEPB Inhale 1 puff into the lungs daily. Patient not taking: Reported on 04/28/2022 01/22/22   Geryl Councilman L, PA  folic acid (FOLVITE) 1 MG tablet Take 1 tablet (1 mg total) by mouth daily. 05/01/22   Raiford Noble Latif, DO  HYDROcodone-acetaminophen (NORCO/VICODIN) 5-325 MG tablet Take 1 tablet by mouth every 4 (four) hours as needed for moderate pain or severe pain. 04/15/22   [provider]  METHADONE HCL PO Take 85 mg by mouth daily.    [provider]  Multiple Vitamin (MULTIVITAMIN WITH MINERALS) TABS tablet Take 1 tablet by mouth daily. 05/01/22   Sheikh, Georgina Quint Latif, DO  nicotine (NICODERM CQ - DOSED IN MG/24 HOURS) 21 mg/24hr patch Place 1 patch (21 mg total) onto the skin daily. 05/01/22   Sheikh, Omair Latif, DO  ondansetron (ZOFRAN) 4 MG tablet Take 1 tablet (4 mg total) by mouth every 6 (six) hours as needed for nausea. 05/01/22   Sheikh, Omair Latif, DO  senna-docusate (SENOKOT-S) 8.6-50 MG tablet Take 1 tablet by mouth at bedtime as needed for mild constipation. 05/01/22   Raiford Noble Latif, DO  tamsulosin (FLOMAX) 0.4 MG CAPS capsule Take 1 capsule by mouth at bedtime 05/14/22   Camillia Herter, NP  sucralfate (CARAFATE) 1 GM/10ML suspension Take 10 mLs (1 g total) by mouth 4 (four) times daily -  with meals and at bedtime. Patient not taking: Reported on 11/28/2019 10/21/19 06/13/20  Randal Buba, April, MD    Family History Family History  Problem Relation Age of Onset   Colon cancer Father    Asthma Father    Cancer Father        prostate   Hypertension Father    Asthma Mother    Hypertension Mother    Stomach cancer Neg Hx    Esophageal cancer Neg Hx     Social History Social History   Tobacco Use   Smoking status: Every Day    Packs/day: 1.00    Years: 47.00    Total pack years: 47.00    Types: Cigarettes   Smokeless tobacco: Never   Tobacco comments:    Cutting back>> Quit Smart card supplied  Vaping Use   Vaping Use: Never used  Substance Use Topics   Alcohol use: Not Currently    Comment: stopped years ago   Drug use: Not Currently    Types: Cocaine, Marijuana, Methamphetamines     Allergies   Aspirin, Yellow jacket venom [bee venom], Codeine, Ibuprofen, and Morphine and related   Review of Systems Review of Systems Per HPI  Physical Exam Triage Vital Signs ED Triage Vitals  Enc Vitals Group     BP 05/27/22 1825 (!) 160/58     Pulse Rate 05/27/22 1825 66     Resp 05/27/22 1825 18      Temp 05/27/22 1825 98 F (36.7 C)  Temp src --      SpO2 05/27/22 1825 98 %     Weight --      Height --      Head Circumference --      Peak Flow --      Pain Score 05/27/22 1823 4     Pain Loc --      Pain Edu? --      Excl. in [place]? --    No data found.  Updated Vital Signs BP (!) 160/58   Pulse 66   Temp 98 F (36.7 C)   Resp 18   SpO2 98%   Visual Acuity Right Eye Distance:   Left Eye Distance:   Bilateral Distance:    Right Eye Near:   Left Eye Near:    Bilateral Near:     Physical Exam Constitutional:      General: He is not in acute distress.    Appearance: Normal appearance. He is not toxic-appearing or diaphoretic.  HENT:     Head: Normocephalic and atraumatic.     Right Ear: Tympanic membrane and ear canal normal.     Left Ear: Tympanic membrane and ear canal normal.     Nose: Congestion present.     Mouth/Throat:     Mouth: Mucous membranes are moist.     Pharynx: No posterior oropharyngeal erythema.  Eyes:     Extraocular Movements: Extraocular movements intact.     Conjunctiva/sclera: Conjunctivae normal.     Pupils: Pupils are equal, round, and reactive to light.  Cardiovascular:     Rate and Rhythm: Normal rate and regular rhythm.     Pulses: Normal pulses.     Heart sounds: Normal heart sounds.  Pulmonary:     Effort: Pulmonary effort is normal. No respiratory distress.     Breath sounds: No stridor. Rhonchi present. No wheezing or rales.  Abdominal:     General: Abdomen is flat. Bowel sounds are normal.     Palpations: Abdomen is soft.  Musculoskeletal:        General: Normal range of motion.     Cervical back: Normal range of motion.  Skin:    General: Skin is warm and dry.  Neurological:     General: No focal deficit present.     Mental Status: He is alert and oriented to person, place, and time. Mental status is at baseline.  Psychiatric:        Mood and Affect: Mood normal.        Behavior: Behavior normal.      UC  Treatments / Results  Labs (all labs ordered are listed, but only abnormal results are displayed) Labs Reviewed  SARS CORONAVIRUS 2 BY RT PCR    EKG   Radiology DG Chest 2 View  Result Date: 05/27/2022 CLINICAL DATA:  Cough with shortness of breath, body aches and chills for 3 days. EXAM: CHEST - 2 VIEW COMPARISON:  CT 04/19/2022.  Radiographs 04/19/2022 and 01/22/2022. FINDINGS: The heart size and mediastinal contours are stable. There is chronic central airway thickening. The interstitial markings appear more accentuated on today's frontal examination, although this difference may be technical. No airspace disease, edema, pleural effusion or pneumothorax identified. IMPRESSION: Chronic obstructive pulmonary disease with possible increased interstitial thickening versus technical differences. No evidence of pneumonia. Electronically Signed   By: Richardean Sale M.D.   On: 05/27/2022 19:01    Procedures Procedures (including critical care time)  Medications Ordered in UC Medications  ipratropium-albuterol (DUONEB) 0.5-2.5 (3) MG/3ML nebulizer solution 3 mL (3 mLs Nebulization Given 05/27/22 1857)    Initial Impression / Assessment and Plan / UC Course  I have reviewed the triage vital signs and the nursing notes.  Pertinent labs & imaging results that were available during my care of the patient were reviewed by me and considered in my medical decision making (see chart for details).     Chest x-ray was negative for any acute cardiopulmonary process.  Suspect patient has a viral upper respiratory infection that is causing exacerbation of COPD.  DuoNeb was administered in urgent care with improvement in patient's symptoms.  Vital signs and patient's oxygen are stable so do not think that patient is in need of emergent evaluation at the hospital.  No signs of tachypnea as well.  Do think patient would benefit from prednisone.  I will do a low-dose and short course of prednisone given that  patient was recently discharged from the hospital due to cellulitis so as not to cause increased infection.  Patient reports he has taken prednisone before and has tolerated well.  No other obvious contraindications to prednisone noted in patient's history.  DuoNeb was also prescribed for patient to take at home as needed.  Patient provided with nebulizer machine prior to discharge.  Patient was given strict return and ER precautions. covid test pending.  Patient verbalized understanding and was agreeable with plan. Final Clinical Impressions(s) / UC Diagnoses   Final diagnoses:  Viral upper respiratory tract infection with cough  COPD exacerbation [facility])     Discharge Instructions      It appears that you have a viral illness causing a flareup of your COPD.  I have prescribed you 3 medications to help alleviate symptoms.  Nebulizer machine has been prescribed for you that you will use the DuoNeb medication in.  Please follow-up if any symptoms persist or worsen.  Your COVID test is pending.  We will call if it is positive.     ED Prescriptions     Medication Sig Dispense Auth. Provider   predniSONE (DELTASONE) 20 MG tablet Take 1 tablet (20 mg total) by mouth daily for 5 days. 5 tablet [place], [place] E, [place]   ipratropium-albuterol (DUONEB) 0.5-2.5 (3) MG/3ML SOLN Take 3 mLs by nebulization every 6 (six) hours as needed. 360 mL , [place] E, [place]   benzonatate (TESSALON) 100 MG capsule Take 1 capsule (100 mg total) by mouth every 8 (eight) hours as needed for cough. 21 capsule [place], Michele Rockers, [place]      PDMP not reviewed this encounter.   Teodora Medici, [place] 05/27/22 [phone]

## 2022-05-27 NOTE — Discharge Instructions (Signed)
It appears that you have a viral illness causing a flareup of your COPD.  I have prescribed you 3 medications to help alleviate symptoms.  Nebulizer machine has been prescribed for you that you will use the DuoNeb medication in.  Please follow-up if any symptoms persist or worsen.  Your COVID test is pending.  We will call if it is positive.

## 2022-05-28 LAB — SARS CORONAVIRUS 2 BY RT PCR: SARS Coronavirus 2 by RT PCR: NEGATIVE

## 2022-06-02 DIAGNOSIS — F1120 Opioid dependence, uncomplicated: Secondary | ICD-10-CM | POA: Diagnosis not present

## 2022-06-04 ENCOUNTER — Encounter (HOSPITAL_COMMUNITY): Payer: Self-pay

## 2022-06-04 ENCOUNTER — Other Ambulatory Visit: Payer: Self-pay

## 2022-06-04 ENCOUNTER — Emergency Department (HOSPITAL_COMMUNITY): Payer: Medicare HMO

## 2022-06-04 ENCOUNTER — Emergency Department (HOSPITAL_COMMUNITY)
Admission: EM | Admit: 2022-06-04 | Discharge: 2022-06-04 | Disposition: A | Discharge status: Home / Self Care | Payer: Medicare HMO | Attending: Emergency Medicine | Admitting: Emergency Medicine

## 2022-06-04 ENCOUNTER — Ambulatory Visit
Admission: EM | Admit: 2022-06-04 | Discharge: 2022-06-04 | Disposition: A | Discharge status: Home / Self Care | Payer: Medicare HMO | Attending: Internal Medicine | Admitting: Internal Medicine

## 2022-06-04 DIAGNOSIS — S61431A Puncture wound without foreign body of right hand, initial encounter: Secondary | ICD-10-CM | POA: Diagnosis not present

## 2022-06-04 DIAGNOSIS — W268XXA Contact with other sharp object(s), not elsewhere classified, initial encounter: Secondary | ICD-10-CM | POA: Diagnosis not present

## 2022-06-04 DIAGNOSIS — Z7901 Long term (current) use of anticoagulants: Secondary | ICD-10-CM | POA: Diagnosis not present

## 2022-06-04 DIAGNOSIS — M795 Residual foreign body in soft tissue: Secondary | ICD-10-CM | POA: Diagnosis not present

## 2022-06-04 DIAGNOSIS — Z23 Encounter for immunization: Secondary | ICD-10-CM | POA: Diagnosis not present

## 2022-06-04 DIAGNOSIS — S61441A Puncture wound with foreign body of right hand, initial encounter: Secondary | ICD-10-CM

## 2022-06-04 DIAGNOSIS — S65301A Unspecified injury of deep palmar arch of right hand, initial encounter: Secondary | ICD-10-CM | POA: Diagnosis present

## 2022-06-04 MED ORDER — ACETAMINOPHEN 500 MG PO TABS
1000.0000 mg | ORAL_TABLET | Freq: Once | ORAL | Status: AC
  Administered 2022-06-04: 1000 mg via ORAL

## 2022-06-04 MED ORDER — OXYCODONE-ACETAMINOPHEN 5-325 MG PO TABS
1.0000 | ORAL_TABLET | Freq: Once | ORAL | Status: AC
  Administered 2022-06-04: 1 via ORAL
  Filled 2022-06-04: qty 1

## 2022-06-04 MED ORDER — TETANUS-DIPHTH-ACELL PERTUSSIS 5-2.5-18.5 LF-MCG/0.5 IM SUSY
0.5000 mL | PREFILLED_SYRINGE | Freq: Once | INTRAMUSCULAR | Status: AC
  Administered 2022-06-04: 0.5 mL via INTRAMUSCULAR
  Filled 2022-06-04: qty 0.5

## 2022-06-04 MED ORDER — CIPROFLOXACIN HCL 500 MG PO TABS: 500.0000 mg | ORAL_TABLET | Freq: Two times a day (BID) | ORAL | 0 refills | Status: DC

## 2022-06-04 MED ORDER — CIPROFLOXACIN HCL 500 MG PO TABS
500.0000 mg | ORAL_TABLET | Freq: Once | ORAL | Status: AC
  Administered 2022-06-04: 500 mg via ORAL
  Filled 2022-06-04: qty 1

## 2022-06-04 NOTE — ED Notes (Signed)
Patient is being discharged from the Urgent Care and sent to the Emergency Department via POV . Per Oswaldo Conroy NP, patient is in need of higher level of care for further evaluation. Patient is aware and verbalizes understanding of plan of care.  Vitals:   06/04/22 1553  BP: (!) 181/50  Pulse: 75  Resp: (!) 24  Temp: 98.4 F (36.9 C)  SpO2: 98%

## 2022-06-04 NOTE — Discharge Instructions (Signed)
Please go to the emergency department as soon as you leave urgent care for further evaluation and management. ?

## 2022-06-04 NOTE — Discharge Instructions (Addendum)
As discussed, with your puncture wound it is important to monitor your condition carefully and follow-up with our hand surgery colleagues.  Return here for concerning changes in your condition.

## 2022-06-04 NOTE — ED Triage Notes (Signed)
Pt presents with puncture wound to R hand.  Fell on deck and punctured with screw. Pt vomiting due to pain during intake.  Pt having difficulty answering questions d/t pain. States it is worsening and traveling down arm.

## 2022-06-04 NOTE — ED Triage Notes (Addendum)
Pt presents with puncture wound to right hand s/p fall onto rusty screw on deck 2 hr pta, reports pain radiating to wrist.  Unknown last tetanus.

## 2022-06-04 NOTE — ED Provider Triage Note (Signed)
Emergency Medicine Provider Triage Evaluation Note  BARNES FLOREK , a 61 y.o. male  was evaluated in triage.  Pt complains of puncture wound to the right palm.  Sent from urgent care for further evaluation.  [place] and caught himself on outstretched right arm and a screw punctured the palm of his right hand.  Urgent care patient was having severe pain and difficulty moving the hand due to pain with some associated swelling.  He is on Eliquis and Plavix.  Sent to the ED for further ration.  Unsure of last tetanus.  No x-rays done at urgent care.  Review of Systems  Positive: wound Negative: Fever, bleeding  Physical Exam  BP (!) 159/71   Pulse 76   Temp 98.3 F (36.8 C)   Resp 20   SpO2 100%  Gen:   Awake, no distress   Resp:  Normal effort  MSK:   Moves extremities without difficulty  Other:  Puncture wound to the right palm with some surrounding swelling and swelling over the wrist, compartments soft, limited range of motion of the fingers due to pain.  Medical Decision Making  Medically screening exam initiated at 5:04 PM.  Appropriate orders placed.  RAMONA SLINGER was informed that the remainder of the evaluation will be completed by another provider, this initial triage assessment does not replace that evaluation, and the importance of remaining in the ED until their evaluation is complete.  Tetanus and x-rays of the right hand and wrist ordered.   Jacqlyn Larsen, [place] 06/04/22 1710

## 2022-06-04 NOTE — ED Provider Notes (Signed)
[facility] [facility] DEPT Provider Note   CSN: [id number] Arrival date & time: 06/04/22  1643     History  Chief Complaint  Patient presents with   Hand Injury    Martin [name] is a 61 y.o. male.  HPI Patient is a 61 year old male presenting with a chief complaint of right hand pain. Patient was sent from urgent care. Patient states he fell on an outstretched arm and punctured the palm of his right hand with a screw. Patient states the pain is radiating into his wrist. He rates the pain a 10/10. He denies numbness or tingling. Patient does not know when his last tetanus shot was. Patient is taking Eliquis and Plavix. He denies fever, fatigue, dizziness. Patient states he did have an episode of vomiting upon arrival.     Home Medications Prior to Admission medications   Medication Sig Start Date End Date Taking? Authorizing Provider  ciprofloxacin (CIPRO) 500 MG tablet Take 1 tablet (500 mg total) by mouth every 12 (twelve) hours. 06/04/22  Yes Carmin Muskrat, MD  acetaminophen (TYLENOL) 500 MG tablet Take 1,000 mg by mouth every 6 (six) hours as needed for mild pain.    [provider]  albuterol (VENTOLIN HFA) 108 (90 Base) MCG/ACT inhaler Inhale 2 puffs into the lungs every 6 (six) hours as needed for wheezing or shortness of breath.    [provider]  apixaban (ELIQUIS) 2.5 MG TABS tablet Take 1 tablet (2.5 mg total) by mouth 2 (two) times daily. 01/14/22   Schnier, Dolores Lory, MD  benzonatate (TESSALON) 100 MG capsule Take 1 capsule (100 mg total) by mouth every 8 (eight) hours as needed for cough. 05/27/22   Teodora Medici, FNP  clopidogrel (PLAVIX) 75 MG tablet Take 1 tablet by mouth once daily 02/28/22   Schnier, Dolores Lory, MD  econazole nitrate 1 % cream Apply topically daily. Patient taking differently: Apply 1 Application topically daily as needed (rash). 01/17/22   Nyoka Lint, PA-C  EPINEPHrine (EPIPEN 2-PAK) 0.3 mg/0.3 mL IJ SOAJ  injection Inject 0.3 mg (1 pen) into the muscle as needed for anaphylaxis. 05/01/22   Raiford Noble Latif, DO  feeding supplement (ENSURE ENLIVE / ENSURE PLUS) LIQD Take 237 mLs by mouth 3 (three) times daily between meals. 05/01/22   Sheikh, Omair Latif, DO  fluticasone furoate-vilanterol (BREO ELLIPTA) 100-25 MCG/ACT AEPB Inhale 1 puff into the lungs daily. Patient not taking: Reported on 04/28/2022 01/22/22   Geryl Councilman L, PA  folic acid (FOLVITE) 1 MG tablet Take 1 tablet (1 mg total) by mouth daily. 05/01/22   Raiford Noble Latif, DO  HYDROcodone-acetaminophen (NORCO/VICODIN) 5-325 MG tablet Take 1 tablet by mouth every 4 (four) hours as needed for moderate pain or severe pain. 04/15/22   [provider]  ipratropium-albuterol (DUONEB) 0.5-2.5 (3) MG/3ML SOLN Take 3 mLs by nebulization every 6 (six) hours as needed. 05/27/22   Mound, Michele Rockers, FNP  METHADONE HCL PO Take 85 mg by mouth daily.    [provider]  Multiple Vitamin (MULTIVITAMIN WITH MINERALS) TABS tablet Take 1 tablet by mouth daily. 05/01/22   Sheikh, Georgina Quint Latif, DO  nicotine (NICODERM CQ - DOSED IN MG/24 HOURS) 21 mg/24hr patch Place 1 patch (21 mg total) onto the skin daily. 05/01/22   Sheikh, Omair Latif, DO  ondansetron (ZOFRAN) 4 MG tablet Take 1 tablet (4 mg total) by mouth every 6 (six) hours as needed for nausea. 05/01/22   Raiford Noble  Latif, DO  senna-docusate (SENOKOT-S) 8.6-50 MG tablet Take 1 tablet by mouth at bedtime as needed for mild constipation. 05/01/22   Raiford Noble Latif, DO  tamsulosin (FLOMAX) 0.4 MG CAPS capsule Take 1 capsule by mouth at bedtime 05/14/22   Camillia Herter, NP  sucralfate (CARAFATE) 1 GM/10ML suspension Take 10 mLs (1 g total) by mouth 4 (four) times daily -  with meals and at bedtime. Patient not taking: Reported on 11/28/2019 10/21/19 06/13/20  Palumbo, April, MD      Allergies    Aspirin, Yellow jacket venom [bee venom], Codeine, Ibuprofen, and Morphine and related    Review  of Systems   Review of Systems  All other systems reviewed and are negative.   Physical Exam Updated Vital Signs BP (!) 159/71   Pulse 76   Temp 98.3 F (36.8 C)   Resp 20   SpO2 100%  Physical Exam Vitals and nursing note reviewed.  Constitutional:      General: He is not in acute distress.    Appearance: He is well-developed.  HENT:     Head: Normocephalic and atraumatic.  Eyes:     Conjunctiva/sclera: Conjunctivae normal.  Cardiovascular:     Rate and Rhythm: Normal rate and regular rhythm.     Pulses: Normal pulses.  Pulmonary:     Effort: Pulmonary effort is normal. No respiratory distress.     Breath sounds: No stridor.  Abdominal:     General: There is no distension.  Musculoskeletal:       Arms:  Skin:    General: Skin is warm and dry.  Neurological:     Mental Status: He is alert and oriented to person, place, and time.     ED Results / Procedures / Treatments   Labs (all labs ordered are listed, but only abnormal results are displayed) Labs Reviewed - No data to display  EKG None  Radiology DG Wrist Complete Right  Result Date: 06/04/2022 CLINICAL DATA:  Puncture wound to hand EXAM: RIGHT WRIST - COMPLETE 3+ VIEW COMPARISON:  None Available. FINDINGS: Two small radiopaque foreign bodies are noted in the anterior soft tissues on the lateral view, likely in the mid hand region on the frontal view. No acute bony abnormality. Specifically, no fracture, subluxation, or dislocation. Joint spaces maintained. IMPRESSION: Two small radiopaque foreign bodies in the anterior soft tissues. No acute bony abnormality. Electronically Signed   By: Rolm Baptise M.D.   On: 06/04/2022 17:22   DG Hand Complete Right  Result Date: 06/04/2022 CLINICAL DATA:  Puncture wound to hand EXAM: RIGHT HAND - COMPLETE 3+ VIEW COMPARISON:  None Available. FINDINGS: Small radiopaque foreign bodies seen anteriorly on the lateral view. No fracture, subluxation or dislocation. Mild joint  space narrowing at the 2nd and 3rd MCP joints. IMPRESSION: 2 small radiopaque foreign bodies in the anterior soft tissues best seen on the lateral view. No acute bony abnormality. Electronically Signed   By: Rolm Baptise M.D.   On: 06/04/2022 17:21    Procedures Procedures    Medications Ordered in ED Medications  Tdap (BOOSTRIX) injection 0.5 mL (0.5 mLs Intramuscular Given 06/04/22 2148)  oxyCODONE-acetaminophen (PERCOCET/ROXICET) 5-325 MG per tablet 1 tablet (1 tablet Oral Given 06/04/22 2147)  ciprofloxacin (CIPRO) tablet 500 mg (500 mg Oral Given 06/04/22 2146)    ED Course/ Medical Decision Making/ A&P  Medical Decision Making Adult male presents after sustaining a puncture wound, now with pain in the thenar fat pad, but with preserved distal neurovascular status, some limitation for pain acknowledged.  Patient is awake, alert, afebrile, no evidence for bacteremia, sepsis.  Patient does have some evidence for small retained foreign bodies, but with the degree of swelling, excision, exploration did not feasible.  I discussed this case with our hand surgeon, Dr. Tempie Donning, patient will start antibiotics, was received tetanus, will start analgesics, will follow-up in the office.  Amount and/or Complexity of Data Reviewed External Data Reviewed: notes.    Details: Urgent care notes reviewed, discussed with the patient, in terms of that evaluation Radiology:  Decision-making details documented in ED Course.    Details: Foreign bodies visualized on x-ray Discussion of management or test interpretation with external provider(s): As above I discussed the case with our oral surgery colleagues, patient will be placed in thumb spica, start antibiotics, received tetanus, will follow-up in the clinic for wound repeat check  Risk Prescription drug management. Decision regarding hospitalization. Risk Details: Potential minor surgery for exploration of foreign bodies once  swelling has subsided.   Final Clinical Impression(s) / ED Diagnoses Final diagnoses:  Puncture wound of right hand with foreign body, initial encounter    Rx / DC Orders ED Discharge Orders          Ordered    ciprofloxacin (CIPRO) 500 MG tablet  Every 12 hours        06/04/22 2207              Carmin Muskrat, MD 06/04/22 2207

## 2022-06-04 NOTE — ED Provider Notes (Signed)
[name]    CSN: [id number] Arrival date & time: 06/04/22  1453      History   Chief Complaint Chief Complaint  Patient presents with   Hand Injury    HPI Martin [name] is a 61 y.o. male.   Patient presents for further evaluation of puncture wound to right hand that occurred about 1 to 2 hours prior to arrival to urgent care.  Patient reports that he was working when he fell through a board and caught himself with his right hand.  He states that there was a screw on the board that he caught himself with that went through the palm of his right hand.  He had to pull his hand off of the screw but states that he does not think that it went all the way through.  Patient is not sure of last tetanus vaccine.  He does take Eliquis and Plavix.  Patient reports that pain appears to be worsening and is radiating up fingers and down forearm.  He is having difficulty moving fingers given pain.   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   Peripheral vascular disease ([place])    Pneumonia    as child, cough at present with no fever   Pneumonia    as child, cough at present with no fever    Radiation 11/19/11-01/08/12   5040 cGy 28 fx Pelvis and inguinal area   Stroke ([place])    no weaknes or paralysis   Substance abuse [facility])     Patient Active Problem List    Diagnosis Date Noted   Protein-calorie malnutrition, severe 04/29/2022   Cellulitis of right lower extremity 04/28/2022   Sepsis 10/12/2021   Peripheral vascular disease    AKI    Tobacco use    COPD (chronic obstructive pulmonary disease) (HCC)    BPH     Periodontal disease    IVDU, cocaine use    Demand ischemia    Thrombocytopenia    Normocytic anemia    MSSA bacteremia 10/11/2021   Callus 06/12/2021   Hav (hallux abducto valgus), unspecified laterality 06/12/2021   Hammer toes, bilateral 06/12/2021   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/2021   Hyperlipidemia 06/07/2021   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   Elevated blood-pressure reading, without diagnosis of hypertension 09/26/2020   Body mass index (BMI) 29.0-29.9, adult 08/29/2020   Partial small bowel obstruction ([place]) 03/10/2020   Small bowel obstruction due to adhesions ([place]) 03/08/2020   Enteritis 10/15/2019   Abnormal liver function 10/15/2019   Leukocytosis 10/15/2019   Nausea & vomiting 10/15/2019   GAD (generalized anxiety disorder) 05/18/2017   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   Constipation 04/11/2017   Bee  sting 04/05/2017   Diverticulosis 11/29/2014   Healthcare maintenance 11/06/2014   Cough 10/05/2014   Insomnia 09/13/2014   Erectile dysfunction 11/30/2013   Chronic pain syndrome 11/30/2013   Depression 11/30/2013   GERD (gastroesophageal reflux disease)    Hypertension    Anxiety    Arthritis    Osteoarthritis of lumbar spine    Radiation    Night sweats 08/12/2012   Tobacco abuse counseling 05/26/2012   Lymphocytic colitis 05/03/2012   Vitamin B12 deficiency 03/19/2012   Folate deficiency 03/19/2012   Inguinal hernia 11/24/2011   Cancer ([place]) 10/14/2011   Internal hemorrhoid 09/11/2011   Anal cancer 10/09/2010    Past Surgical History:  Procedure Laterality Date   APPENDECTOMY     age 39 or 13   EXAMINATION UNDER  ANESTHESIA  10/14/2011   Procedure: EXAM UNDER ANESTHESIA;  Surgeon: Earnstine Regal, MD;  Location: [facility];  Service: General;  Laterality: N/A;  exam under anethesia, Excision of mass anal canal, 1.5cm   INGUINAL HERNIA REPAIR  05/11/2012   Procedure: HERNIA REPAIR INGUINAL ADULT;  Surgeon: Earnstine Regal, MD;  Location: [place];  Service: General;  Laterality: Left;  left inguinal hernia repair with mesh   LOWER EXTREMITY ANGIOGRAPHY Left 07/02/2021   Procedure: LOWER EXTREMITY ANGIOGRAPHY;  Surgeon: Katha Cabal, MD;  Location: [place] [facility];  Service: Cardiovascular;  Laterality: Left;   LOWER EXTREMITY ANGIOGRAPHY Right 07/16/2021   Procedure: LOWER EXTREMITY ANGIOGRAPHY;  Surgeon: Katha Cabal, MD;  Location: [place] [facility];  Service: Cardiovascular;  Laterality: Right;   LOWER EXTREMITY ANGIOGRAPHY Left 08/27/2021   Procedure: LOWER EXTREMITY ANGIOGRAPHY;  Surgeon: Katha Cabal, MD;  Location: [place] [facility];  Service: Cardiovascular;  Laterality: Left;   LOWER EXTREMITY ANGIOGRAPHY Left 01/14/2022   Procedure: Lower Extremity Angiography;  Surgeon: Katha Cabal, MD;  Location: [place] [facility];  Service: Cardiovascular;  Laterality: Left;   SMALL INTESTINE SURGERY  04/19/2017   exploratory lap, partial small bowel resection for perforated ulcer/abcess, radiation enteritis   surgical pathology   10/14/2011   squamous cell ca of anus   TEE WITHOUT CARDIOVERSION N/A 10/16/2021   Procedure: TRANSESOPHAGEAL ECHOCARDIOGRAM (TEE);  Surgeon: [name], MD;  Location: [facility] ENDOSCOPY;  Service: Cardiovascular;  Laterality: N/A;   TOOTH EXTRACTION N/A 12/04/2021   Procedure: DENTAL RESTORATION/EXTRACTIONS;  Surgeon: Diona Browner, DMD;  Location: [place];  Service: Oral Surgery;  Laterality: N/A;   transanal excision  10/14/2011   Dr.Todd Gerkin       Home Medications    Prior to Admission medications   Medication Sig Start  Date End Date Taking? Authorizing Provider  METHADONE HCL PO Take 85 mg by mouth daily.   Yes [provider]  senna-docusate (SENOKOT-S) 8.6-50 MG tablet Take 1 tablet by mouth at bedtime as needed for mild constipation. 05/01/22  Yes Sheikh, Omair Latif, DO  acetaminophen (TYLENOL) 500 MG tablet Take 1,000 mg by mouth every 6 (six) hours as needed for mild pain.    [provider]  albuterol (VENTOLIN HFA) 108 (90 Base) MCG/ACT inhaler Inhale 2 puffs into the lungs every 6 (six) hours as needed for wheezing or shortness of breath.    [provider]  apixaban (ELIQUIS) 2.5 MG TABS tablet Take 1 tablet (2.5 mg total) by mouth 2 (two) times daily. 01/14/22   Schnier, Dolores Lory, MD  benzonatate (TESSALON) 100 MG capsule Take 1 capsule (100 mg total)  by mouth every 8 (eight) hours as needed for cough. 05/27/22   Teodora Medici, FNP  clopidogrel (PLAVIX) 75 MG tablet Take 1 tablet by mouth once daily 02/28/22   Schnier, Dolores Lory, MD  econazole nitrate 1 % cream Apply topically daily. Patient taking differently: Apply 1 Application topically daily as needed (rash). 01/17/22   Nyoka Lint, PA-C  EPINEPHrine (EPIPEN 2-PAK) 0.3 mg/0.3 mL IJ SOAJ injection Inject 0.3 mg (1 pen) into the muscle as needed for anaphylaxis. 05/01/22   Raiford Noble Latif, DO  feeding supplement (ENSURE ENLIVE / ENSURE PLUS) LIQD Take 237 mLs by mouth 3 (three) times daily between meals. 05/01/22   Sheikh, Omair Latif, DO  fluticasone furoate-vilanterol (BREO ELLIPTA) 100-25 MCG/ACT AEPB Inhale 1 puff into the lungs daily. Patient not taking: Reported on 04/28/2022 01/22/22   Geryl Councilman L, PA  folic acid (FOLVITE) 1 MG tablet Take 1 tablet (1 mg total) by mouth daily. 05/01/22   Raiford Noble Latif, DO  HYDROcodone-acetaminophen (NORCO/VICODIN) 5-325 MG tablet Take 1 tablet by mouth every 4 (four) hours as needed for moderate pain or severe pain. 04/15/22   [provider]  ipratropium-albuterol  (DUONEB) 0.5-2.5 (3) MG/3ML SOLN Take 3 mLs by nebulization every 6 (six) hours as needed. 05/27/22   Teodora Medici, FNP  Multiple Vitamin (MULTIVITAMIN WITH MINERALS) TABS tablet Take 1 tablet by mouth daily. 05/01/22   Sheikh, Georgina Quint Latif, DO  nicotine (NICODERM CQ - DOSED IN MG/24 HOURS) 21 mg/24hr patch Place 1 patch (21 mg total) onto the skin daily. 05/01/22   Sheikh, Omair Latif, DO  ondansetron (ZOFRAN) 4 MG tablet Take 1 tablet (4 mg total) by mouth every 6 (six) hours as needed for nausea. 05/01/22   Raiford Noble Latif, DO  tamsulosin (FLOMAX) 0.4 MG CAPS capsule Take 1 capsule by mouth at bedtime 05/14/22   Camillia Herter, NP  sucralfate (CARAFATE) 1 GM/10ML suspension Take 10 mLs (1 g total) by mouth 4 (four) times daily -  with meals and at bedtime. Patient not taking: Reported on 11/28/2019 10/21/19 06/13/20  Randal Buba, April, MD    Family History Family History  Problem Relation Age of Onset   Colon cancer Father    Asthma Father    Cancer Father        prostate   Hypertension Father    Asthma Mother    Hypertension Mother    Stomach cancer Neg Hx    Esophageal cancer Neg Hx     Social History Social History   Tobacco Use   Smoking status: Every Day    Packs/day: 1.00    Years: 47.00    Total pack years: 47.00    Types: Cigarettes   Smokeless tobacco: Never   Tobacco comments:    Cutting back>> Quit Smart card supplied  Vaping Use   Vaping Use: Never used  Substance Use Topics   Alcohol use: Not Currently    Comment: stopped years ago   Drug use: Not Currently    Types: Cocaine, Marijuana, Methamphetamines     Allergies   Aspirin, Yellow jacket venom [bee venom], Codeine, Ibuprofen, and Morphine and related   Review of Systems Review of Systems Per HPI  Physical Exam Triage Vital Signs ED Triage Vitals  Enc Vitals Group     BP 06/04/22 1553 (!) 181/50     Pulse Rate 06/04/22 1553 75     Resp 06/04/22 1553 (!) 24     Temp 06/04/22 1553 98.4  F (36.9  C)     Temp Source 06/04/22 1553 Oral     SpO2 06/04/22 1553 98 %     Weight --      Height --      Head Circumference --      Peak Flow --      Pain Score 06/04/22 1547 10     Pain Loc --      Pain Edu? --      Excl. in [place]? --    No data found.  Updated Vital Signs BP (!) 181/50 (BP Location: Left Arm)   Pulse 75   Temp 98.4 F (36.9 C) (Oral)   Resp (!) 24   SpO2 98%   Visual Acuity Right Eye Distance:   Left Eye Distance:   Bilateral Distance:    Right Eye Near:   Left Eye Near:    Bilateral Near:     Physical Exam Constitutional:      General: He is not in acute distress.    Appearance: Normal appearance. He is not toxic-appearing or diaphoretic.     Comments: Patient is very restless as well as vomiting due to pain.  HENT:     Head: Normocephalic and atraumatic.  Eyes:     Extraocular Movements: Extraocular movements intact.     Conjunctiva/sclera: Conjunctivae normal.  Pulmonary:     Effort: Pulmonary effort is normal.  Skin:    Comments: Puncture wound with bleeding controlled present to palmar surface of right hand at proximal thumb.  Grip strength is 2/5.  Patient having difficulty with range of motion of fingers due to pain.  Also has tenderness throughout the wrist.  There is associated swelling directly opposite to puncture wound but there does not appear to be an exit wound.  Capillary refill and pulses are normal.  Neurological:     General: No focal deficit present.     Mental Status: He is alert and oriented to person, place, and time. Mental status is at baseline.  Psychiatric:        Mood and Affect: Mood normal.        Behavior: Behavior normal.        Thought Content: Thought content normal.        Judgment: Judgment normal.      UC Treatments / Results  Labs (all labs ordered are listed, but only abnormal results are displayed) Labs Reviewed - No data to display  EKG   Radiology No results found.  Procedures Procedures  (including critical care time)  Medications Ordered in UC Medications  acetaminophen (TYLENOL) tablet 1,000 mg (1,000 mg Oral Given 06/04/22 1604)    Initial Impression / Assessment and Plan / UC Course  I have reviewed the triage vital signs and the nursing notes.  Pertinent labs & imaging results that were available during my care of the patient were reviewed by me and considered in my medical decision making (see chart for details).     I am concerned that patient has musculature damage from puncture wound given amount of pain and difficulty with range of motion on physical exam.  Patient also takes 2 blood thinning medications which is worrisome.  Bleeding is currently controlled.  I would recommend that patient gets further evaluation and management as more advanced imaging may be necessary for extensive evaluation.  Do not have these resources here in urgent care so the patient was advised to go to the emergency department for further evaluation and management.  Will defer further evaluation, management, tetanus shot to ED.  Patient was agreeable with plan.  Patient requesting pain medication prior to discharge.  Limited options on pain management given that patient appears to take methadone and takes blood thinning medications.  Will administer Tylenol.  Vital signs and patient stable at discharge.  Agree with patient self transport to the hospital. Final Clinical Impressions(s) / UC Diagnoses   Final diagnoses:  Puncture wound of right hand without foreign body, initial encounter     Discharge Instructions      Please go to the emergency department as soon as you leave urgent care for further evaluation and management.    ED Prescriptions   None    PDMP not reviewed this encounter.   Teodora Medici, [place] 06/04/22 [id number]

## 2022-06-06 ENCOUNTER — Encounter: Payer: Self-pay | Admitting: Orthopedic Surgery

## 2022-06-06 DIAGNOSIS — S61421A Laceration with foreign body of right hand, initial encounter: Secondary | ICD-10-CM | POA: Diagnosis not present

## 2022-06-09 DIAGNOSIS — F1120 Opioid dependence, uncomplicated: Secondary | ICD-10-CM | POA: Diagnosis not present

## 2022-06-16 DIAGNOSIS — F1120 Opioid dependence, uncomplicated: Secondary | ICD-10-CM | POA: Diagnosis not present

## 2022-06-23 DIAGNOSIS — F1120 Opioid dependence, uncomplicated: Secondary | ICD-10-CM | POA: Diagnosis not present

## 2022-06-30 DIAGNOSIS — F1120 Opioid dependence, uncomplicated: Secondary | ICD-10-CM | POA: Diagnosis not present

## 2022-07-07 DIAGNOSIS — F1120 Opioid dependence, uncomplicated: Secondary | ICD-10-CM | POA: Diagnosis not present

## 2022-07-14 DIAGNOSIS — F1120 Opioid dependence, uncomplicated: Secondary | ICD-10-CM | POA: Diagnosis not present

## 2022-07-21 DIAGNOSIS — F1120 Opioid dependence, uncomplicated: Secondary | ICD-10-CM | POA: Diagnosis not present

## 2022-07-23 ENCOUNTER — Ambulatory Visit (HOSPITAL_COMMUNITY)
Admission: EM | Admit: 2022-07-23 | Discharge: 2022-07-23 | Disposition: A | Discharge status: Home / Self Care | Payer: Medicare HMO | Attending: Emergency Medicine | Admitting: Emergency Medicine

## 2022-07-23 ENCOUNTER — Encounter (HOSPITAL_COMMUNITY): Payer: Self-pay

## 2022-07-23 DIAGNOSIS — L02415 Cutaneous abscess of right lower limb: Secondary | ICD-10-CM | POA: Diagnosis not present

## 2022-07-23 MED ORDER — ALBUTEROL SULFATE HFA 108 (90 BASE) MCG/ACT IN AERS: 2.0000 | INHALATION_SPRAY | Freq: Four times a day (QID) | RESPIRATORY_TRACT | 1 refills | Status: DC | PRN

## 2022-07-23 MED ORDER — DOXYCYCLINE HYCLATE 100 MG PO CAPS: 100.0000 mg | ORAL_CAPSULE | Freq: Two times a day (BID) | ORAL | 0 refills | Status: DC

## 2022-07-23 NOTE — ED Provider Notes (Signed)
[place]    CSN: [id number] Arrival date & time: 07/23/22  8527      History   Chief Complaint Chief Complaint  Patient presents with   Insect Bite    HPI Martin [name] is a 61 y.o. male.   Presents with erythema, tenderness and swelling to the right thigh beginning 3 days ago.  Endorses slight started as a small red dot that he scratched away, symptoms began within the day after occurrence.  Not attempted treatment.  Denies fever, drainage.  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   Peripheral vascular disease ([place])    Pneumonia    as child, cough at present with no fever   Pneumonia    as child, cough at present with no fever    Radiation 11/19/11-01/08/12   5040 cGy 28 fx Pelvis and inguinal area   Stroke ([place])    no weaknes or paralysis   Substance abuse [facility])     Patient Active Problem List   Diagnosis Date Noted   Protein-calorie malnutrition, severe 04/29/2022   Cellulitis of right lower extremity 04/28/2022   Sepsis 10/12/2021   Peripheral vascular disease    AKI    Tobacco use    COPD (chronic obstructive pulmonary disease) (HCC)    BPH     Periodontal disease    IVDU, cocaine use    Demand ischemia    Thrombocytopenia    Normocytic anemia    MSSA bacteremia 10/11/2021   Callus 06/12/2021   Hav (hallux abducto valgus), unspecified  laterality 06/12/2021   Hammer toes, bilateral 06/12/2021   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/2021   Hyperlipidemia 06/07/2021   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   Elevated blood-pressure reading, without diagnosis of hypertension 09/26/2020   Body mass index (BMI) 29.0-29.9, adult 08/29/2020   Partial small bowel obstruction ([place]) 03/10/2020   Small bowel obstruction due to adhesions ([place]) 03/08/2020   Enteritis 10/15/2019   Abnormal liver function 10/15/2019   Leukocytosis 10/15/2019   Nausea & vomiting 10/15/2019   GAD (generalized anxiety disorder) 05/18/2017   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   Constipation 04/11/2017   Bee sting 04/05/2017   Diverticulosis 11/29/2014   Healthcare maintenance 11/06/2014   Cough 10/05/2014   Insomnia 09/13/2014   Erectile dysfunction 11/30/2013   Chronic pain syndrome 11/30/2013   Depression 11/30/2013   GERD (gastroesophageal reflux disease)    Hypertension    Anxiety    Arthritis    Osteoarthritis of lumbar spine    Radiation    Night sweats 08/12/2012   Tobacco abuse counseling 05/26/2012   Lymphocytic colitis 05/03/2012   Vitamin B12 deficiency 03/19/2012   Folate deficiency 03/19/2012   Inguinal hernia 11/24/2011  Cancer ([place]) 10/14/2011   Internal hemorrhoid 09/11/2011   Anal cancer 10/09/2010    Past Surgical History:  Procedure Laterality Date   APPENDECTOMY     age 53 or 27   EXAMINATION UNDER ANESTHESIA  10/14/2011   Procedure: EXAM UNDER ANESTHESIA;  Surgeon: Earnstine Regal, MD;  Location: [facility];  Service: General;  Laterality: N/A;  exam under anethesia, Excision of mass anal canal, 1.5cm   INGUINAL HERNIA REPAIR  05/11/2012   Procedure: HERNIA REPAIR INGUINAL ADULT;  Surgeon: Earnstine Regal, MD;  Location: [facility];  Service: General;  Laterality: Left;  left inguinal hernia repair with mesh   LOWER EXTREMITY ANGIOGRAPHY Left  07/02/2021   Procedure: LOWER EXTREMITY ANGIOGRAPHY;  Surgeon: Katha Cabal, MD;  Location: [facility];  Service: Cardiovascular;  Laterality: Left;   LOWER EXTREMITY ANGIOGRAPHY Right 07/16/2021   Procedure: LOWER EXTREMITY ANGIOGRAPHY;  Surgeon: Katha Cabal, MD;  Location: [facility];  Service: Cardiovascular;  Laterality: Right;   LOWER EXTREMITY ANGIOGRAPHY Left 08/27/2021   Procedure: LOWER EXTREMITY ANGIOGRAPHY;  Surgeon: Katha Cabal, MD;  Location: [facility];  Service: Cardiovascular;  Laterality: Left;   LOWER EXTREMITY ANGIOGRAPHY Left 01/14/2022   Procedure: Lower Extremity Angiography;  Surgeon: Katha Cabal, MD;  Location: [facility];  Service: Cardiovascular;  Laterality: Left;   SMALL INTESTINE SURGERY  04/19/2017   exploratory lap, partial small bowel resection for perforated ulcer/abcess, radiation enteritis   surgical pathology   10/14/2011   squamous cell ca of anus   TEE WITHOUT CARDIOVERSION N/A 10/16/2021   Procedure: TRANSESOPHAGEAL ECHOCARDIOGRAM (TEE);  Surgeon: [name], MD;  Location[facility];  Service: Cardiovascular;  Laterality: N/A;   TOOTH EXTRACTION N/A 12/04/2021   Procedure: DENTAL RESTORATION/EXTRACTIONS;  Surgeon: Diona Browner, DMD;  Location: [facility];  Service: Oral Surgery;  Laterality: N/A;   transanal excision  10/14/2011   Dr.Todd Gerkin       Home Medications    Prior to Admission medications   Medication Sig Start Date End Date Taking? Authorizing Provider  acetaminophen (TYLENOL) 500 MG tablet Take 1,000 mg by mouth every 6 (six) hours as needed for mild pain.   Yes [provider]  albuterol (VENTOLIN HFA) 108 (90 Base) MCG/ACT inhaler Inhale 2 puffs into the lungs every 6 (six) hours as needed for wheezing or shortness of breath.   Yes [provider]  apixaban (ELIQUIS) 2.5 MG TABS tablet Take 1 tablet (2.5 mg total) by mouth 2 (two) times daily.  01/14/22  Yes Schnier, Dolores Lory, MD  clopidogrel (PLAVIX) 75 MG tablet Take 1 tablet by mouth once daily 02/28/22  Yes Schnier, Dolores Lory, MD  econazole nitrate 1 % cream Apply topically daily. Patient taking differently: Apply 1 Application topically daily as needed (rash). 01/17/22  Yes Nyoka Lint, PA-C  EPINEPHrine (EPIPEN 2-PAK) 0.3 mg/0.3 mL IJ SOAJ injection Inject 0.3 mg (1 pen) into the muscle as needed for anaphylaxis. 05/01/22  Yes Sheikh, [place], DO  feeding supplement (ENSURE ENLIVE / ENSURE PLUS) LIQD Take 237 mLs by mouth 3 (three) times daily between meals. 05/01/22  Yes Sheikh, Omair Latif, DO  fluticasone furoate-vilanterol (BREO ELLIPTA) 100-25 MCG/ACT AEPB Inhale 1 puff into the lungs daily. 01/22/22  Yes Crain, Whitney L, PA  folic acid (FOLVITE) 1 MG tablet Take 1 tablet (1 mg total) by mouth daily. 05/01/22  Yes Sheikh, Omair Latif, DO  HYDROcodone-acetaminophen (NORCO/VICODIN) [phone]  MG tablet Take 1 tablet by mouth every 4 (four) hours as needed for moderate pain or severe pain. 04/15/22  Yes [provider]  ipratropium-albuterol (DUONEB) 0.5-2.5 (3) MG/3ML SOLN Take 3 mLs by nebulization every 6 (six) hours as needed. 05/27/22  Yes Mound, [place] E, FNP  METHADONE HCL PO Take 85 mg by mouth daily.   Yes [provider]  Multiple Vitamin (MULTIVITAMIN WITH MINERALS) TABS tablet Take 1 tablet by mouth daily. 05/01/22  Yes Sheikh, Omair Latif, DO  nicotine (NICODERM CQ - DOSED IN MG/24 HOURS) 21 mg/24hr patch Place 1 patch (21 mg total) onto the skin daily. 05/01/22  Yes Sheikh, Omair Latif, DO  ondansetron (ZOFRAN) 4 MG tablet Take 1 tablet (4 mg total) by mouth every 6 (six) hours as needed for nausea. 05/01/22  Yes Sheikh, Otho, DO  senna-docusate (SENOKOT-S) 8.6-50 MG tablet Take 1 tablet by mouth at bedtime as needed for mild constipation. 05/01/22  Yes Sheikh, Omair Latif, DO  tamsulosin (FLOMAX) 0.4 MG CAPS capsule Take 1 capsule by mouth at bedtime 05/14/22   Yes Minette Brine, Amy J, NP  benzonatate (TESSALON) 100 MG capsule Take 1 capsule (100 mg total) by mouth every 8 (eight) hours as needed for cough. 05/27/22   Teodora Medici, FNP  ciprofloxacin (CIPRO) 500 MG tablet Take 1 tablet (500 mg total) by mouth every 12 (twelve) hours. 06/04/22   Carmin Muskrat, MD  sucralfate (CARAFATE) 1 GM/10ML suspension Take 10 mLs (1 g total) by mouth 4 (four) times daily -  with meals and at bedtime. Patient not taking: Reported on 11/28/2019 10/21/19 06/13/20  Randal Buba, April, MD    Family History Family History  Problem Relation Age of Onset   Colon cancer Father    Asthma Father    Cancer Father        prostate   Hypertension Father    Asthma Mother    Hypertension Mother    Stomach cancer Neg Hx    Esophageal cancer Neg Hx     Social History Social History   Tobacco Use   Smoking status: Every Day    Packs/day: 1.00    Years: 47.00    Total pack years: 47.00    Types: Cigarettes   Smokeless tobacco: Never   Tobacco comments:    Cutting back>> Quit Smart card supplied  Scientific laboratory technician Use: Never used  Substance Use Topics   Alcohol use: Not Currently    Comment: stopped years ago   Drug use: Not Currently    Types: Cocaine, Marijuana, Methamphetamines     Allergies   Aspirin, Yellow jacket venom [bee venom], Codeine, Ibuprofen, and Morphine and related   Review of Systems Review of Systems  Constitutional: Negative.   Respiratory: Negative.    Cardiovascular: Negative.   Skin:  Positive for wound. Negative for color change, pallor and rash.  Neurological: Negative.      Physical Exam Triage Vital Signs ED Triage Vitals  Enc Vitals Group     BP 07/23/22 0947 (!) 150/51     Pulse Rate 07/23/22 0947 60     Resp 07/23/22 0947 18     Temp 07/23/22 0947 97.7 F (36.5 C)     Temp Source 07/23/22 0947 Oral     SpO2 07/23/22 0947 98 %     Weight 07/23/22 0946 150 lb (68 kg)     Height 07/23/22 0946 5' 11"  (1.803 m)     Head  Circumference --  Peak Flow --      Pain Score 07/23/22 0945 10     Pain Loc --      Pain Edu? --      Excl. in [place]? --    No data found.  Updated Vital Signs BP (!) 150/51 (BP Location: Left Arm)   Pulse 60   Temp 97.7 F (36.5 C) (Oral)   Resp 18   Ht 5' 11"  (1.803 m)   Wt 150 lb (68 kg)   SpO2 98%   BMI 20.92 kg/m   Visual Acuity Right Eye Distance:   Left Eye Distance:   Bilateral Distance:    Right Eye Near:   Left Eye Near:    Bilateral Near:     Physical Exam Constitutional:      Appearance: Normal appearance.  HENT:     Head: Normocephalic.  Eyes:     Extraocular Movements: Extraocular movements intact.  Pulmonary:     Effort: Pulmonary effort is normal.  Skin:    Comments: 1x1 cm Erythematous and firm immature abscess present to the medial aspect of the right thigh  Neurological:     Mental Status: He is alert and oriented to person, place, and time.      UC Treatments / Results  Labs (all labs ordered are listed, but only abnormal results are displayed) Labs Reviewed - No data to display  EKG   Radiology No results found.  Procedures Procedures (including critical care time)  Medications Ordered in UC Medications - No data to display  Initial Impression / Assessment and Plan / UC Course  I have reviewed the triage vital signs and the nursing notes.  Pertinent labs & imaging results that were available during my care of the patient were reviewed by me and considered in my medical decision making (see chart for details).  Abscess of right thigh  Presentation is consistent with abscess, unable to attempt incision and drainage due to firmness, low suspicion of healed, discussed with patient, doxycycline course prescribed, extended to 10 days due to history of cancer and patient endorse that he is somewhat immunocompromised, with skin infections frequently, advised warm compresses to the affected area to soften tissue and facilitate  drainage, may use over-the-counter Tylenol for management of discomfort, advised to avoid NSAIDs as he is currently taking Eliquis, given strict precautions to follow-up with urgent care for nonhealing or nondraining site Final Clinical Impressions(s) / UC Diagnoses   Final diagnoses:  None   Discharge Instructions   None    ED Prescriptions   None    PDMP not reviewed this encounter.   Hans Eden, NP 07/23/22 1007

## 2022-07-23 NOTE — ED Triage Notes (Signed)
Pt c/o insect bite to left inner thigh x3days  Pt states that it is painful, red, and swollen.   Pt states that it did form a bump and he popped it. After popping it, it became red and swelled.

## 2022-07-23 NOTE — Discharge Instructions (Signed)
Take doxycycline every morning and every evening for 10 days, ideally you begin to see improvement in about 48 hours and steady progression from there  Hold warm-hot compresses to affected area at least 4 times a day, this helps to facilitate draining, the more the better  Use over-the-counter Tylenol 500 mg every 6 hours for management of any discomfort  Please return for evaluation for increased swelling, increased tenderness or pain, non healing site, non draining site, you begin to have fever or chills   We reviewed the etiology of recurrent abscesses of skin.  Skin abscesses are collections of pus within the dermis and deeper skin tissues. Skin abscesses manifest as painful, tender, fluctuant, and erythematous nodules, frequently surmounted by a pustule and surrounded by a rim of erythematous swelling.  Spontaneous drainage of purulent material may occur.  Fever can occur on occasion.    -Skin abscesses can develop in healthy individuals with no predisposing conditions other than skin or nasal carriage of Staphylococcus aureus.  Individuals in close contact with others who have active infection with skin abscesses are at increased risk which is likely to explain why twin brother has similar episodes.   In addition, any process leading to a breach in the skin barrier can also predispose to the development of a skin abscesses, such as atopic dermatitis.

## 2022-07-28 DIAGNOSIS — F1120 Opioid dependence, uncomplicated: Secondary | ICD-10-CM | POA: Diagnosis not present

## 2022-08-11 DIAGNOSIS — F1120 Opioid dependence, uncomplicated: Secondary | ICD-10-CM | POA: Diagnosis not present

## 2022-08-12 ENCOUNTER — Ambulatory Visit (INDEPENDENT_AMBULATORY_CARE_PROVIDER_SITE_OTHER): Payer: Medicare HMO | Admitting: Nurse Practitioner

## 2022-08-12 ENCOUNTER — Encounter (INDEPENDENT_AMBULATORY_CARE_PROVIDER_SITE_OTHER): Payer: Self-pay | Admitting: Nurse Practitioner

## 2022-08-12 ENCOUNTER — Ambulatory Visit (INDEPENDENT_AMBULATORY_CARE_PROVIDER_SITE_OTHER): Payer: Medicare HMO

## 2022-08-12 VITALS — BP 132/52 | HR 52 | Resp 16 | Wt 143.2 lb

## 2022-08-12 DIAGNOSIS — I70222 Atherosclerosis of native arteries of extremities with rest pain, left leg: Secondary | ICD-10-CM

## 2022-08-12 DIAGNOSIS — I10 Essential (primary) hypertension: Secondary | ICD-10-CM

## 2022-08-12 DIAGNOSIS — E785 Hyperlipidemia, unspecified: Secondary | ICD-10-CM

## 2022-08-15 ENCOUNTER — Ambulatory Visit
Admission: EM | Admit: 2022-08-15 | Discharge: 2022-08-15 | Discharge status: Against Medical Advice | Payer: Medicare HMO

## 2022-08-24 ENCOUNTER — Encounter (INDEPENDENT_AMBULATORY_CARE_PROVIDER_SITE_OTHER): Payer: Self-pay | Admitting: Nurse Practitioner

## 2022-08-24 NOTE — Progress Notes (Signed)
Subjective:    Patient ID: Martin [name], male    DOB: 04-23-1961, 61 y.o.   MRN: [id number] Chief Complaint  Patient presents with   Follow-up    Ultrasound follow up    The patient returns to the office for followup and review of the noninvasive studies.   The patient notes that there has been a significant deterioration in the lower extremity symptoms.  The patient notes interval shortening of their claudication distance and development of mild rest pain symptoms. No new ulcers or wounds have occurred since the last visit.  There have been no significant changes to the patient's overall health care.  The patient denies amaurosis fugax or recent TIA symptoms. There are no recent neurological changes noted. There is no history of DVT, PE or superficial thrombophlebitis. The patient denies recent episodes of angina or shortness of breath.   ABI's Rt=1.15 and Lt=1.15 (previous ABI's Rt=1.04 and Lt=1.10) Duplex US of the lower extremity arterial system shows biphasic tibial artery waveforms bilaterally, however arterial duplex shows hemodynamically significant stenosis in the left profunda artery.  This is significant as the previous angiogram did show that he had diffuse disease throughout.  In reviewing the angiogram report it also notes that the patient does not have a left hypogastric artery which may be exacerbating his symptoms in his hip.    Review of Systems  Cardiovascular:        Claudication  All other systems reviewed and are negative.      Objective:   Physical Exam Vitals reviewed.  HENT:     Head: Normocephalic.  Cardiovascular:     Rate and Rhythm: Normal rate.     Pulses:          Dorsalis pedis pulses are detected w/ Doppler on the right side and detected w/ Doppler on the left side.       Posterior tibial pulses are detected w/ Doppler on the right side and detected w/ Doppler on the left side.  Pulmonary:     Effort: Pulmonary effort is normal.   Skin:    General: Skin is warm and dry.  Neurological:     Mental Status: He is alert and oriented to person, place, and time.  Psychiatric:        Mood and Affect: Mood normal.        Behavior: Behavior normal.        Thought Content: Thought content normal.        Judgment: Judgment normal.     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  Peripheral vascular disease ([place])    Pneumonia    as child, cough at present with no fever   Pneumonia    as child, cough at present with no fever    Radiation 11/19/11-01/08/12   5040 cGy 28 fx Pelvis and inguinal area   Stroke ([place])    no weaknes or paralysis   Substance abuse ([place])     Social History   Socioeconomic History   Marital status: Widowed    Spouse name: Not on file   Number of children: 3   Years of education: Not on file   Highest education level: Not on file  Occupational History   Occupation: unemployed  Tobacco Use   Smoking status: Every Day    Packs/day: 1.00    Years: 47.00    Total pack years: 47.00    Types: Cigarettes   Smokeless tobacco: Never   Tobacco comments:    Cutting back>> Quit Smart card supplied  Scientific laboratory technician Use: Never used  Substance and Sexual Activity   Alcohol use: Not  Currently    Comment: stopped years ago   Drug use: Not Currently    Types: Cocaine, Marijuana, Methamphetamines   Sexual activity: Yes    Birth control/protection: Condom  Other Topics Concern   Not on file  Social History Narrative   Not on file   Social Determinants of Health   Financial Resource Strain: Not on file  Food Insecurity: Not on file  Transportation Needs: Not on file  Physical Activity: Not on file  Stress: Not on file  Social Connections: Not on file  Intimate Partner Violence: Not on file    Past Surgical History:  Procedure Laterality Date   APPENDECTOMY     age 76 or 73   EXAMINATION UNDER ANESTHESIA  10/14/2011   Procedure: EXAM UNDER ANESTHESIA;  Surgeon: Earnstine Regal, MD;  Location: [facility];  Service: General;  Laterality: N/A;  exam under anethesia, Excision of mass anal canal, 1.5cm   INGUINAL HERNIA REPAIR  05/11/2012   Procedure: HERNIA REPAIR INGUINAL ADULT;  Surgeon: Earnstine Regal, MD;  Location: [facility];  Service: General;  Laterality: Left;  left inguinal hernia repair with mesh   LOWER EXTREMITY ANGIOGRAPHY Left 07/02/2021   Procedure: LOWER EXTREMITY ANGIOGRAPHY;  Surgeon: Katha Cabal, MD;  Location: [facility];  Service: Cardiovascular;  Laterality: Left;   LOWER EXTREMITY ANGIOGRAPHY Right 07/16/2021   Procedure: LOWER EXTREMITY ANGIOGRAPHY;  Surgeon: Katha Cabal, MD;  Location: [facility];  Service: Cardiovascular;  Laterality: Right;   LOWER EXTREMITY ANGIOGRAPHY Left 08/27/2021   Procedure: LOWER EXTREMITY ANGIOGRAPHY;  Surgeon: Katha Cabal, MD;  Location: [facility];  Service: Cardiovascular;  Laterality: Left;   LOWER EXTREMITY ANGIOGRAPHY Left 01/14/2022   Procedure: Lower Extremity Angiography;  Surgeon: Katha Cabal, MD;  Location: [facility];  Service: Cardiovascular;  Laterality: Left;   SMALL INTESTINE SURGERY  04/19/2017   exploratory lap, partial  small bowel resection for perforated ulcer/abcess, radiation enteritis   surgical pathology   10/14/2011   squamous cell ca of anus   TEE WITHOUT CARDIOVERSION N/A 10/16/2021   Procedure: TRANSESOPHAGEAL ECHOCARDIOGRAM (TEE);  Surgeon: [name], MD;  Location: [facility];  Service: Cardiovascular;  Laterality: N/A;   TOOTH EXTRACTION N/A 12/04/2021   Procedure: DENTAL RESTORATION/EXTRACTIONS;  Surgeon: Diona Browner, DMD;  Location: [facility];  Service: Oral Surgery;  Laterality: N/A;  transanal excision  10/14/2011   Dr.Todd Gerkin    Family History  Problem Relation Age of Onset   Colon cancer Father    Asthma Father    Cancer Father        prostate   Hypertension Father    Asthma Mother    Hypertension Mother    Stomach cancer Neg Hx    Esophageal cancer Neg Hx     Allergies  Allergen Reactions   Aspirin Swelling    Lip swelling   Yellow Jacket Venom [Bee Venom] Anaphylaxis   Codeine Nausea Only   Ibuprofen     On blood thinners   Morphine And Related Itching       Latest Ref Rng & Units 04/30/2022    5:15 AM 04/29/2022    4:31 AM 04/28/2022   12:57 PM  CBC  WBC 4.0 - 10.5 K/uL 8.9  8.3  11.7   Hemoglobin 13.0 - 17.0 g/dL 9.7  9.3  9.6   Hematocrit 39.0 - 52.0 % 30.8  29.4  29.5   Platelets 150 - 400 K/uL 153  141  168       CMP     Component Value Date/Time   NA 140 04/30/2022 0515   NA 137 03/02/2022 1136   NA 141 10/19/2014 1204   K 4.9 04/30/2022 0515   K 4.6 10/19/2014 1204   CL 110 04/30/2022 0515   CO2 25 04/30/2022 0515   CO2 26 10/19/2014 1204   GLUCOSE 100 (H) 04/30/2022 0515   GLUCOSE 91 10/19/2014 1204   BUN 15 04/30/2022 0515   BUN 11 03/02/2022 1136   BUN 10.8 10/19/2014 1204   CREATININE 0.89 04/30/2022 0515   CREATININE 0.83 07/05/2015 1042   CREATININE 0.9 10/19/2014 1204   CALCIUM 9.1 04/30/2022 0515   CALCIUM 9.6 10/19/2014 1204   PROT 6.9 04/28/2022 1257   PROT 6.5 06/06/2021 1134   PROT 7.3 10/19/2014 1204   ALBUMIN  3.6 04/28/2022 1257   ALBUMIN 4.3 06/06/2021 1134   ALBUMIN 4.0 10/19/2014 1204   AST 19 04/28/2022 1257   AST 15 10/19/2014 1204   ALT 17 04/28/2022 1257   ALT 13 10/19/2014 1204   ALKPHOS 93 04/28/2022 1257   ALKPHOS 83 10/19/2014 1204   BILITOT <0.1 (L) 04/28/2022 1257   BILITOT 0.5 06/06/2021 1134   BILITOT 0.22 10/19/2014 1204   GFRNONAA >60 04/30/2022 0515   GFRNONAA >89 07/05/2015 1042   GFRAA >60 03/10/2020 1219   GFRAA >89 07/05/2015 1042     VAS [place] ABI WITH/WO TBI  Result Date: 08/14/2022  LOWER EXTREMITY DOPPLER STUDY Patient Name:  Martin [name]  Date of Exam:   08/12/2022 Medical Rec #: [id number]        Accession #:    [id number] Date of Birth: 09-Jul-1961        Patient Gender: M Patient Age:   58 years Exam Location:  [facility] Procedure:      VAS [place] ABI WITH/WO TBI Referring Phys: [name] --------------------------------------------------------------------------------  Indications: Peripheral artery disease.  Vascular Interventions: 01/2022 left pta stent of SFA/Pop                         06/2021; 07/2021 multiple interventions bilaterally.                         Right EIA stent, Bilate PTA's throughout. Comparison Study: 02/10/2022 Performing  Technologist: Almira Coaster RVS  Examination Guidelines: A complete evaluation includes at minimum, Doppler waveform signals and systolic blood pressure reading at the level of bilateral brachial, anterior tibial, and posterior tibial arteries, when vessel segments are accessible. Bilateral testing is considered an integral part of a complete examination. Photoelectric Plethysmograph (PPG) waveforms and toe systolic pressure readings are included as required and additional duplex testing as needed. Limited examinations for reoccurring indications may be performed as noted.  ABI Findings: +---------+------------------+-----+--------+--------+ Right    Rt Pressure (mmHg)IndexWaveformComment   +---------+------------------+-----+--------+--------+ Brachial 149                                     +---------+------------------+-----+--------+--------+ ATA      167               1.07 biphasic         +---------+------------------+-----+--------+--------+ PTA      179               1.15 biphasic         +---------+------------------+-----+--------+--------+ Great Toe135               0.87 Normal           +---------+------------------+-----+--------+--------+ +---------+------------------+-----+--------+-------+ Left     Lt Pressure (mmHg)IndexWaveformComment +---------+------------------+-----+--------+-------+ Brachial 156                                    +---------+------------------+-----+--------+-------+ ATA      179               1.15 biphasic        +---------+------------------+-----+--------+-------+ PTA      159               1.02 biphasic        +---------+------------------+-----+--------+-------+ Galvin Proffer               0.79 Normal          +---------+------------------+-----+--------+-------+ +-------+-----------+-----------+------------+------------+ ABI/TBIToday's ABIToday's TBIPrevious ABIPrevious TBI +-------+-----------+-----------+------------+------------+ Right  1.15       .87        1.04        .85          +-------+-----------+-----------+------------+------------+ Left   1.15       .79        1.10        .74          +-------+-----------+-----------+------------+------------+  Bilateral ABIs appear essentially unchanged compared to prior study on 02/10/2022. Right TBIs appear essentially unchanged compared to prior study on 02/10/2022. Left TBIs appear to be increased compared to prior study on 02/10/2022.  Summary: Right: Resting right ankle-brachial index is within normal range. The right toe-brachial index is normal. Left: Resting left ankle-brachial index is within normal range. The left toe-brachial  index is normal. *See table(s) above for measurements and observations.  Electronically signed by [name] MD on 08/14/2022 at 1:08:40 PM.    Final        Assessment & Plan:   1. 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  Given the severity of the patient's severe left lower extremity symptoms the patient should undergo angiography with  the hope for intervention.  Risk and benefits were reviewed the patient.  Indications for the procedure were reviewed.  All questions were answered, the patient agrees to proceed with left lower extremity angiography and possible intervention.   The patient should continue walking and begin a more formal exercise program.  The patient should continue antiplatelet therapy and aggressive treatment of the lipid abnormalities  The patient will follow up with me after the angiogram.   2. Primary hypertension Continue antihypertensive medications as already ordered, these medications have been reviewed and there are no changes at this time.  3. Hyperlipidemia, unspecified hyperlipidemia type Continue statin as ordered and reviewed, no changes at this time   Current Outpatient Medications on File Prior to Visit  Medication Sig Dispense Refill   acetaminophen (TYLENOL) 500 MG tablet Take 1,000 mg by mouth every 6 (six) hours as needed for mild pain.     albuterol (VENTOLIN HFA) 108 (90 Base) MCG/ACT inhaler Inhale 2 puffs into the lungs every 6 (six) hours as needed for wheezing or shortness of breath. 18 g 1   apixaban (ELIQUIS) 2.5 MG TABS tablet Take 1 tablet (2.5 mg total) by mouth 2 (two) times daily. 60 tablet 6   benzonatate (TESSALON) 100 MG capsule Take 1 capsule (100 mg total) by mouth every 8 (eight) hours as needed for cough. 21 capsule 0    ciprofloxacin (CIPRO) 500 MG tablet Take 1 tablet (500 mg total) by mouth every 12 (twelve) hours. 10 tablet 0   clopidogrel (PLAVIX) 75 MG tablet Take 1 tablet by mouth once daily 30 tablet 0   doxycycline (VIBRAMYCIN) 100 MG capsule Take 1 capsule (100 mg total) by mouth 2 (two) times daily. 20 capsule 0   econazole nitrate 1 % cream Apply topically daily. (Patient taking differently: Apply 1 Application topically daily as needed (rash).) 15 g 0   EPINEPHrine (EPIPEN 2-PAK) 0.3 mg/0.3 mL IJ SOAJ injection Inject 0.3 mg (1 pen) into the muscle as needed for anaphylaxis. 2 each 0   feeding supplement (ENSURE ENLIVE / ENSURE PLUS) LIQD Take 237 mLs by mouth 3 (three) times daily between meals. 237 mL 12   fluticasone furoate-vilanterol (BREO ELLIPTA) 100-25 MCG/ACT AEPB Inhale 1 puff into the lungs daily. 60 each 0   folic acid (FOLVITE) 1 MG tablet Take 1 tablet (1 mg total) by mouth daily.     HYDROcodone-acetaminophen (NORCO/VICODIN) 5-325 MG tablet Take 1 tablet by mouth every 4 (four) hours as needed for moderate pain or severe pain.     ipratropium-albuterol (DUONEB) 0.5-2.5 (3) MG/3ML SOLN Take 3 mLs by nebulization every 6 (six) hours as needed. 360 mL 0   METHADONE HCL PO Take 85 mg by mouth daily.     Multiple Vitamin (MULTIVITAMIN WITH MINERALS) TABS tablet Take 1 tablet by mouth daily. 30 tablet 0   nicotine (NICODERM CQ - DOSED IN MG/24 HOURS) 21 mg/24hr patch Place 1 patch (21 mg total) onto the skin daily. 28 patch 0   ondansetron (ZOFRAN) 4 MG tablet Take 1 tablet (4 mg total) by mouth every 6 (six) hours as needed for nausea. 20 tablet 0   senna-docusate (SENOKOT-S) 8.6-50 MG tablet Take 1 tablet by mouth at bedtime as needed for mild constipation. 30 tablet 0   tamsulosin (FLOMAX) 0.4 MG CAPS capsule Take 1 capsule by mouth at bedtime 30 capsule 1   [DISCONTINUED] sucralfate (CARAFATE) 1 GM/10ML suspension Take 10 mLs (1 g total) by mouth 4 (four) times daily -  with meals and at  bedtime. (Patient not taking: Reported on 11/28/2019) 420 mL 0   No current facility-administered medications on file prior to visit.    There are no Patient Instructions on file for this visit. No follow-ups on file.   Kris Hartmann, NP

## 2022-08-25 DIAGNOSIS — F1120 Opioid dependence, uncomplicated: Secondary | ICD-10-CM | POA: Diagnosis not present

## 2022-08-28 ENCOUNTER — Ambulatory Visit
Admission: EM | Admit: 2022-08-28 | Discharge: 2022-08-28 | Disposition: A | Discharge status: Home / Self Care | Payer: Medicare HMO | Attending: Family Medicine | Admitting: Family Medicine

## 2022-08-28 ENCOUNTER — Other Ambulatory Visit: Payer: Self-pay

## 2022-08-28 ENCOUNTER — Encounter: Payer: Self-pay | Admitting: Emergency Medicine

## 2022-08-28 DIAGNOSIS — L089 Local infection of the skin and subcutaneous tissue, unspecified: Secondary | ICD-10-CM

## 2022-08-28 DIAGNOSIS — G8929 Other chronic pain: Secondary | ICD-10-CM

## 2022-08-28 DIAGNOSIS — B9689 Other specified bacterial agents as the cause of diseases classified elsewhere: Secondary | ICD-10-CM | POA: Diagnosis not present

## 2022-08-28 DIAGNOSIS — M25512 Pain in left shoulder: Secondary | ICD-10-CM

## 2022-08-28 MED ORDER — PREDNISONE 20 MG PO TABS: 40.0000 mg | ORAL_TABLET | Freq: Every day | ORAL | 0 refills | Status: DC

## 2022-08-28 MED ORDER — HYDROCODONE-ACETAMINOPHEN 5-325 MG PO TABS: 1.0000 | ORAL_TABLET | Freq: Four times a day (QID) | ORAL | 0 refills | Status: DC | PRN

## 2022-08-28 MED ORDER — DOXYCYCLINE HYCLATE 100 MG PO CAPS: 100.0000 mg | ORAL_CAPSULE | Freq: Two times a day (BID) | ORAL | 0 refills | Status: DC

## 2022-08-28 NOTE — Discharge Instructions (Addendum)
  Call to schedule a follow up with an orthopaedist..  [facility] [address], [place], [place] [address] Phone: [phone]  [facility]: Walk in M-F 8 am to 8 pm [place], [place], [place] [address] Phone: [phone]  [facility]: By Appointment M-F 8 - 5 pm Walk-in M-F 5:30 - 9 pm, Saturday 9 am - 2 pm, Sunday 10 am - 2 pm [place], [place], [place] [address] Phone: [phone]  Be aware, you have been prescribed pain medications that may cause drowsiness. While taking this medication, do not take any other medications containing acetaminophen (Tylenol). Do not combine with alcohol or recreational drugs. Please do not drive, operate heavy machinery, or take part in activities that require making important decisions while on this medication as your judgement may be clouded.

## 2022-08-28 NOTE — ED Triage Notes (Signed)
Pt sts possible spider bite to inside left leg; pt with hx of same; pt sts chronic left shoulder pain and sts cough x 2 weeks

## 2022-09-02 NOTE — ED Provider Notes (Signed)
[place]   [id number] 08/28/22 Arrival Time: 0102  ASSESSMENT & PLAN:  1. Localized bacterial skin infection   2. Chronic left shoulder pain    No abscess requiring I&D but will treat for superficial infection. OTC symptom care as needed.  Meds ordered this encounter  Medications   HYDROcodone-acetaminophen (NORCO/VICODIN) 5-325 MG tablet    Sig: Take 1 tablet by mouth every 6 (six) hours as needed for moderate pain or severe pain.    Dispense:  10 tablet    Refill:  0   predniSONE (DELTASONE) 20 MG tablet    Sig: Take 2 tablets (40 mg total) by mouth daily.    Dispense:  10 tablet    Refill:  0   doxycycline (VIBRAMYCIN) 100 MG capsule    Sig: Take 1 capsule (100 mg total) by mouth 2 (two) times daily.    Dispense:  20 capsule    Refill:  0   Rec orthopaedic f/u for shoulder. [place] Controlled Substances Registry consulted for this patient. I feel the risk/benefit ratio today is favorable for proceeding with this prescription for a controlled substance. Medication sedation precautions given.    Discharge Instructions       Call to schedule a follow up with an orthopaedist..  [facility] [address], [place], [place] [address] Phone: [phone]  [facility]: Walk in M-F 8 am to 8 pm [place], [place], [place] [address] Phone: [phone]  [facility]: By Appointment M-F 8 - 5 pm Walk-in M-F 5:30 - 9 pm, Saturday 9 am - 2 pm, Sunday 10 am - 2 pm [place], [place], [place] [address] Phone: [phone]  Be aware, you have been prescribed pain medications that may cause drowsiness. While taking this medication, do not take any other medications containing acetaminophen (Tylenol). Do not combine with alcohol or recreational drugs. Please do not drive, operate heavy machinery, or take part in activities that require making important decisions while on this medication as your judgement may be  clouded.          Follow-up Information     [facility] at [facility] .   Specialty: Urgent Care Why: If worsening or failing to improve as anticipated. Contact information: [address] [id number] [place] [address] [phone]                Reviewed expectations re: course of current medical issues. Questions answered. Outlined signs and symptoms indicating need for more acute intervention. Understanding verbalized. After Visit Summary given.   SUBJECTIVE: History from: Patient. Martin [name] is a 61 y.o. male. Pt sts possible spider bite to inside left leg; no drainage/bleeding; pt with hx of same; pt sts chronic left shoulder pain. No shoulder trauma reported. Afebrile. Normal PO intake without n/v/d.  OBJECTIVE:  Vitals:   08/28/22 1744  BP: (!) 164/56  Pulse: 72  Resp: 18  Temp: 98.1 F (36.7 C)  TempSrc: Oral  SpO2: 96%    General appearance: alert; no distress Eyes: PERRLA; EOMI; conjunctiva normal HENT: [place]; AT Neck: supple  Lungs: speaks full sentences without difficulty; unlabored Extremities: no edema Skin: warm and dry; sub-cm area of mild erythema on inner L thigh; no fluctuance; mild TTP; no bleeding/drainage Neurologic: normal gait Psychological: alert and cooperative; normal mood and affect   Allergies  Allergen Reactions   Aspirin Swelling    Lip swelling   Yellow  Jacket Venom [Bee Venom] Anaphylaxis   Codeine Nausea Only   Ibuprofen     On blood thinners   Morphine And Related Itching    Past Medical History:  Diagnosis Date   Allergy 10/2011   Anal cancer ([place]) 10/14/2011   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   Peripheral vascular disease ([place])    Pneumonia    as child, cough at present with no fever   Pneumonia    as child, cough at present with no fever    Radiation 11/19/11-01/08/12   5040 cGy 28 fx Pelvis and inguinal area   Stroke ([place])    no weaknes or paralysis   Substance abuse ([place])    Social History   Socioeconomic History   Marital status: Widowed    Spouse name: Not on file   Number of children: 3   Years of education: Not on file   Highest education level: Not on file  Occupational History   Occupation: unemployed  Tobacco Use   Smoking status: Every Day    Packs/day: 1.00    Years: 47.00    Total pack years: 47.00    Types: Cigarettes   Smokeless tobacco: Never   Tobacco comments:    Cutting back>> Quit Smart card supplied  Scientific laboratory technician Use: Never used  Substance and Sexual Activity   Alcohol use: Not Currently    Comment: stopped years ago   Drug use: Not Currently    Types: Cocaine, Marijuana, Methamphetamines   Sexual activity: Yes    Birth control/protection: Condom  Other Topics Concern   Not on file  Social History Narrative   Not on file   Social Determinants of Health   Financial Resource Strain: Not on file  Food Insecurity: Not on file  Transportation Needs: Not on file  Physical Activity: Not on file  Stress: Not on file  Social Connections: Not on file  Intimate Partner Violence: Not on file   Family History  Problem Relation Age of Onset   Colon cancer Father    Asthma Father    Cancer Father        prostate   Hypertension Father    Asthma Mother    Hypertension Mother    Stomach cancer Neg Hx    Esophageal cancer Neg Hx    Past Surgical History:  Procedure  Laterality Date   APPENDECTOMY     age 1 or 54   EXAMINATION UNDER ANESTHESIA  10/14/2011   Procedure: EXAM UNDER ANESTHESIA;  Surgeon: Earnstine Regal, MD;  Location: [facility];  Service: General;  Laterality: N/A;  exam under anethesia, Excision of mass anal canal, 1.5cm   INGUINAL HERNIA REPAIR  05/11/2012   Procedure: HERNIA REPAIR INGUINAL ADULT;  Surgeon: Earnstine Regal, MD;  Location: [facility];  Service: General;  Laterality:  Left;  left inguinal hernia repair with mesh   LOWER EXTREMITY ANGIOGRAPHY Left 07/02/2021   Procedure: LOWER EXTREMITY ANGIOGRAPHY;  Surgeon: Katha Cabal, MD;  Location: [facility];  Service: Cardiovascular;  Laterality: Left;   LOWER EXTREMITY ANGIOGRAPHY Right 07/16/2021   Procedure: LOWER EXTREMITY ANGIOGRAPHY;  Surgeon: Katha Cabal, MD;  Location: [facility];  Service: Cardiovascular;  Laterality: Right;   LOWER EXTREMITY ANGIOGRAPHY Left 08/27/2021   Procedure: LOWER EXTREMITY ANGIOGRAPHY;  Surgeon: Katha Cabal, MD;  Location: [facility];  Service: Cardiovascular;  Laterality: Left;   LOWER EXTREMITY ANGIOGRAPHY Left 01/14/2022   Procedure: Lower Extremity Angiography;  Surgeon: Katha Cabal, MD;  Location: [facility];  Service: Cardiovascular;  Laterality: Left;   SMALL INTESTINE SURGERY  04/19/2017   exploratory lap, partial small bowel resection for perforated ulcer/abcess, radiation enteritis   surgical pathology   10/14/2011   squamous cell ca of anus   TEE WITHOUT CARDIOVERSION N/A 10/16/2021   Procedure: TRANSESOPHAGEAL ECHOCARDIOGRAM (TEE);  Surgeon: [name], MD;  Location: [facility];  Service: Cardiovascular;  Laterality: N/A;   TOOTH EXTRACTION N/A 12/04/2021   Procedure: DENTAL RESTORATION/EXTRACTIONS;  Surgeon: Diona Browner, DMD;  Location: [facility];  Service: Oral Surgery;  Laterality: N/A;   transanal excision  10/14/2011   Dr.Todd Lucia Estelle,  MD 09/02/22 [phone]

## 2022-09-08 DIAGNOSIS — F1120 Opioid dependence, uncomplicated: Secondary | ICD-10-CM | POA: Diagnosis not present

## 2022-09-09 ENCOUNTER — Other Ambulatory Visit: Payer: Self-pay | Admitting: Family

## 2022-09-09 DIAGNOSIS — N400 Enlarged prostate without lower urinary tract symptoms: Secondary | ICD-10-CM

## 2022-09-16 ENCOUNTER — Ambulatory Visit (INDEPENDENT_AMBULATORY_CARE_PROVIDER_SITE_OTHER): Payer: Medicare HMO | Admitting: Family Medicine

## 2022-09-16 ENCOUNTER — Encounter: Payer: Self-pay | Admitting: Family Medicine

## 2022-09-16 VITALS — BP 144/63 | HR 68 | Temp 98.1°F | Resp 16 | Wt 142.0 lb

## 2022-09-16 DIAGNOSIS — F17210 Nicotine dependence, cigarettes, uncomplicated: Secondary | ICD-10-CM

## 2022-09-16 DIAGNOSIS — G8929 Other chronic pain: Secondary | ICD-10-CM

## 2022-09-16 DIAGNOSIS — F17200 Nicotine dependence, unspecified, uncomplicated: Secondary | ICD-10-CM

## 2022-09-16 DIAGNOSIS — Z122 Encounter for screening for malignant neoplasm of respiratory organs: Secondary | ICD-10-CM | POA: Diagnosis not present

## 2022-09-16 DIAGNOSIS — R350 Frequency of micturition: Secondary | ICD-10-CM | POA: Diagnosis not present

## 2022-09-16 DIAGNOSIS — N400 Enlarged prostate without lower urinary tract symptoms: Secondary | ICD-10-CM

## 2022-09-16 DIAGNOSIS — M25512 Pain in left shoulder: Secondary | ICD-10-CM | POA: Diagnosis not present

## 2022-09-16 LAB — POCT URINALYSIS DIP (CLINITEK)
Bilirubin, UA: NEGATIVE
Blood, UA: NEGATIVE
Glucose, UA: NEGATIVE mg/dL
Ketones, POC UA: NEGATIVE mg/dL
Leukocytes, UA: NEGATIVE
Nitrite, UA: NEGATIVE
POC PROTEIN,UA: NEGATIVE
Spec Grav, UA: >=1.03 — AB (ref 1.010–1.025)
Urobilinogen, UA: 0.2 E.U./dL
pH, UA: 6 (ref 5.0–8.0)

## 2022-09-16 MED ORDER — TAMSULOSIN HCL 0.4 MG PO CAPS: 0.4000 mg | ORAL_CAPSULE | Freq: Every day | ORAL | 1 refills | Status: AC

## 2022-09-16 NOTE — Progress Notes (Signed)
Patient is here for med refill of tamsulosin. Patient said that he will  be on this medication for the rest of his life and would like more refills. Patient request something to help use the restroom

## 2022-09-17 NOTE — Progress Notes (Unsigned)
Established Patient Office Visit  Subjective    Patient ID: Martin [name], male    DOB: 1961-09-04  Age: 62 y.o. MRN: [id number]  CC:  Chief Complaint  Patient presents with   Urinary Tract Infection    HPI Martin [name] presents for routine follow up of chronic med issues. Patient reports that he also has been having left shoulder pain for    Outpatient Encounter Medications as of 09/16/2022  Medication Sig   albuterol (VENTOLIN HFA) 108 (90 Base) MCG/ACT inhaler Inhale 2 puffs into the lungs every 6 (six) hours as needed for wheezing or shortness of breath.   apixaban (ELIQUIS) 2.5 MG TABS tablet Take 1 tablet (2.5 mg total) by mouth 2 (two) times daily.   benzonatate (TESSALON) 100 MG capsule Take 1 capsule (100 mg total) by mouth every 8 (eight) hours as needed for cough.   clopidogrel (PLAVIX) 75 MG tablet Take 1 tablet by mouth once daily   COMBIVENT RESPIMAT 20-100 MCG/ACT AERS respimat Inhale 1 puff into the lungs 4 (four) times daily.   doxycycline (VIBRAMYCIN) 100 MG capsule Take 1 capsule (100 mg total) by mouth 2 (two) times daily.   econazole nitrate 1 % cream Apply topically daily. (Patient taking differently: Apply 1 Application topically daily as needed (rash).)   EPINEPHrine (EPIPEN 2-PAK) 0.3 mg/0.3 mL IJ SOAJ injection Inject 0.3 mg (1 pen) into the muscle as needed for anaphylaxis.   feeding supplement (ENSURE ENLIVE / ENSURE PLUS) LIQD Take 237 mLs by mouth 3 (three) times daily between meals.   fluticasone furoate-vilanterol (BREO ELLIPTA) 100-25 MCG/ACT AEPB Inhale 1 puff into the lungs daily.   folic acid (FOLVITE) 1 MG tablet Take 1 tablet (1 mg total) by mouth daily.   HYDROcodone-acetaminophen (NORCO/VICODIN) 5-325 MG tablet Take 1 tablet by mouth every 6 (six) hours as needed for moderate pain or severe pain.   ipratropium-albuterol (DUONEB) 0.5-2.5 (3) MG/3ML SOLN Take 3 mLs by nebulization every 6 (six) hours as needed.   METHADONE HCL PO Take 85 mg by  mouth daily.   Multiple Vitamin (MULTIVITAMIN WITH MINERALS) TABS tablet Take 1 tablet by mouth daily.   nicotine (NICODERM CQ - DOSED IN MG/24 HOURS) 21 mg/24hr patch Place 1 patch (21 mg total) onto the skin daily.   ondansetron (ZOFRAN) 4 MG tablet Take 1 tablet (4 mg total) by mouth every 6 (six) hours as needed for nausea.   predniSONE (DELTASONE) 20 MG tablet Take 2 tablets (40 mg total) by mouth daily.   senna-docusate (SENOKOT-S) 8.6-50 MG tablet Take 1 tablet by mouth at bedtime as needed for mild constipation.   [DISCONTINUED] tamsulosin (FLOMAX) 0.4 MG CAPS capsule Take 1 capsule by mouth at bedtime   ciprofloxacin (CIPRO) 500 MG tablet Take 1 tablet (500 mg total) by mouth every 12 (twelve) hours. (Patient not taking: Reported on 08/28/2022)   tamsulosin (FLOMAX) 0.4 MG CAPS capsule Take 1 capsule (0.4 mg total) by mouth at bedtime.   [DISCONTINUED] sucralfate (CARAFATE) 1 GM/10ML suspension Take 10 mLs (1 g total) by mouth 4 (four) times daily -  with meals and at bedtime. (Patient not taking: Reported on 11/28/2019)   No facility-administered encounter medications on file as of 09/16/2022.    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   Peripheral vascular disease ([place])    Pneumonia    as child, cough at present with no fever   Pneumonia    as child, cough at present with no fever     Radiation 11/19/11-01/08/12   5040 cGy 28 fx Pelvis and inguinal area   Stroke ([place])    no weaknes or paralysis   Substance abuse [facility])     Past Surgical History:  Procedure Laterality Date   APPENDECTOMY     age 50 or 69   EXAMINATION UNDER ANESTHESIA  10/14/2011   Procedure: EXAM UNDER ANESTHESIA;  Surgeon: Earnstine Regal, MD;  Location: [facility];  Service: General;  Laterality: N/A;  exam under anethesia, Excision of mass anal canal, 1.5cm   INGUINAL HERNIA REPAIR  05/11/2012   Procedure: HERNIA REPAIR INGUINAL ADULT;  Surgeon: Earnstine Regal, MD;  Location: [facility];  Service: General;  Laterality: Left;  left inguinal hernia repair with mesh   LOWER EXTREMITY ANGIOGRAPHY Left 07/02/2021   Procedure: LOWER EXTREMITY ANGIOGRAPHY;  Surgeon: Katha Cabal, MD;  Location: [facility];  Service: Cardiovascular;  Laterality: Left;   LOWER EXTREMITY ANGIOGRAPHY Right 07/16/2021   Procedure: LOWER EXTREMITY ANGIOGRAPHY;  Surgeon: Katha Cabal, MD;  Location: [facility] [facility];  Service: Cardiovascular;  Laterality: Right;   LOWER EXTREMITY ANGIOGRAPHY Left 08/27/2021   Procedure: LOWER EXTREMITY ANGIOGRAPHY;  Surgeon: Katha Cabal, MD;  Location: [facility];  Service: Cardiovascular;  Laterality: Left;   LOWER EXTREMITY ANGIOGRAPHY Left 01/14/2022   Procedure: Lower Extremity Angiography;  Surgeon: Katha Cabal, MD;  Location: [place] [facility];  Service: Cardiovascular;  Laterality: Left;   SMALL INTESTINE SURGERY  04/19/2017   exploratory lap, partial small bowel resection for perforated ulcer/abcess, radiation enteritis   surgical pathology   10/14/2011   squamous cell ca of anus   TEE WITHOUT CARDIOVERSION N/A 10/16/2021   Procedure: TRANSESOPHAGEAL ECHOCARDIOGRAM (TEE);  Surgeon: [name], MD;  Location: [facility];  Service: Cardiovascular;  Laterality: N/A;   TOOTH EXTRACTION N/A 12/04/2021   Procedure: DENTAL  RESTORATION/EXTRACTIONS;  Surgeon: Diona Browner, DMD;  Location: [place];  Service: Oral Surgery;  Laterality: N/A;   transanal excision  10/14/2011   Dr.Todd Gerkin    Family History  Problem Relation Age of Onset   Colon cancer Father    Asthma Father    Cancer Father        prostate   Hypertension Father    Asthma Mother    Hypertension Mother    Stomach cancer Neg Hx    Esophageal cancer Neg Hx     Social History   Socioeconomic History   Marital status: Widowed    Spouse name: Not on file   Number of children: 3   Years of education: Not on file   Highest education level: Not on file  Occupational History   Occupation: unemployed  Tobacco Use  Smoking status: Every Day    Packs/day: 1.00    Years: 47.00    Total pack years: 47.00    Types: Cigarettes   Smokeless tobacco: Never   Tobacco comments:    Cutting back>> Quit Smart card supplied  Vaping Use   Vaping Use: Never used  Substance and Sexual Activity   Alcohol use: Not Currently    Comment: stopped years ago   Drug use: Not Currently    Types: Cocaine, Marijuana, Methamphetamines   Sexual activity: Yes    Birth control/protection: Condom  Other Topics Concern   Not on file  Social History Narrative   Not on file   Social Determinants of Health   Financial Resource Strain: Not on file  Food Insecurity: Not on file  Transportation Needs: Not on file  Physical Activity: Not on file  Stress: Not on file  Social Connections: Not on file  Intimate Partner Violence: Not on file    ROS      Objective    BP (!) 144/63   Pulse 68   Temp 98.1 F (36.7 C) (Oral)   Resp 16   Wt 142 lb (64.4 kg)   SpO2 97%   BMI 19.80 kg/m   Physical Exam  {Labs (Optional):23779}    Assessment & Plan:   Problem List Items Addressed This Visit       Genitourinary   BPH    Relevant Medications   tamsulosin (FLOMAX) 0.4 MG CAPS capsule   Other Relevant Orders   Ambulatory referral to Urology    Other Visit Diagnoses     Urination frequency    -  Primary   Relevant Orders   POCT URINALYSIS DIP (CLINITEK) (Completed)   Ambulatory referral to Urology   Chronic left shoulder pain       Relevant Orders   Ambulatory referral to Orthopedic Surgery   Smoking       Relevant Orders   CT CHEST LUNG CA SCREEN LOW DOSE W/O CM   Screening for lung cancer       Relevant Orders   CT CHEST LUNG CA SCREEN LOW DOSE W/O CM       Return in about 6 months (around 03/17/2023) for follow up.   Becky Sax, MD

## 2022-09-18 ENCOUNTER — Encounter: Payer: Self-pay | Admitting: Family Medicine

## 2022-09-19 ENCOUNTER — Telehealth (INDEPENDENT_AMBULATORY_CARE_PROVIDER_SITE_OTHER): Payer: Self-pay

## 2022-09-19 NOTE — Telephone Encounter (Signed)
I attempted to contact the patient to schedule a LLE angio with anesthesia with Dr. Delana Meyer. A message was left for a return call.

## 2022-09-22 ENCOUNTER — Ambulatory Visit
Admission: EM | Admit: 2022-09-22 | Discharge: 2022-09-22 | Disposition: A | Discharge status: Home / Self Care | Payer: Medicare HMO | Attending: Physician Assistant | Admitting: Physician Assistant

## 2022-09-22 DIAGNOSIS — F1120 Opioid dependence, uncomplicated: Secondary | ICD-10-CM | POA: Diagnosis not present

## 2022-09-22 DIAGNOSIS — M545 Low back pain, unspecified: Secondary | ICD-10-CM | POA: Diagnosis not present

## 2022-09-22 DIAGNOSIS — R350 Frequency of micturition: Secondary | ICD-10-CM | POA: Diagnosis not present

## 2022-09-22 LAB — POCT URINALYSIS DIP (MANUAL ENTRY)
Bilirubin, UA: NEGATIVE
Blood, UA: NEGATIVE
Glucose, UA: NEGATIVE mg/dL
Ketones, POC UA: NEGATIVE mg/dL
Leukocytes, UA: NEGATIVE
Nitrite, UA: NEGATIVE
Protein Ur, POC: NEGATIVE mg/dL
Spec Grav, UA: 1.015 (ref 1.010–1.025)
Urobilinogen, UA: 0.2 E.U./dL
pH, UA: 6 (ref 5.0–8.0)

## 2022-09-22 MED ORDER — TIZANIDINE HCL 4 MG PO TABS: 4.0000 mg | ORAL_TABLET | Freq: Four times a day (QID) | ORAL | 0 refills | Status: DC | PRN

## 2022-09-22 MED ORDER — HYDROCODONE-ACETAMINOPHEN 5-325 MG PO TABS: 2.0000 | ORAL_TABLET | Freq: Four times a day (QID) | ORAL | 0 refills | Status: DC | PRN

## 2022-09-22 NOTE — ED Provider Notes (Signed)
[name]    CSN: [id number] Arrival date & time: 09/22/22  1312      History   Chief Complaint Chief Complaint  Patient presents with   Back Pain    HPI Martin [name] is a 62 y.o. male.   62 year old male presents with lower back pain.  Patient indicates for the past 3 days he has been having persistent and progressive lower back pain.  He relates that the pain is localized in the lower part of the back, worse with turning and bending.  Patient indicates he is concerned that it may be his kidneys because he indicates he has been urinating frequently going up multiple times during the night and having a full stream of urine each time.  Clearence Cheek that he has not been drinking enough fluids to be able to go to the bathroom and urinate as much has not been going.  Patient indicates he was drinking sodas but he is switched over to just water.  He indicates he is not having any dysuria, fever, chills, nausea or vomiting.  He indicates that his appetite is good and he has been eating and drinking fluids on a regular basis.  Patient indicates that he has not injured his back, no recent lifting.  He indicates he is unable to take nonsteroidals due to being on Eliquis.   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   Peripheral vascular disease ([place])    Pneumonia    as  child, cough at present with no fever   Pneumonia    as child, cough at present with no fever    Radiation 11/19/11-01/08/12   5040 cGy 28 fx Pelvis and inguinal area   Stroke ([place])    no weaknes or paralysis   Substance abuse [facility])     Patient Active Problem List   Diagnosis Date Noted   Protein-calorie malnutrition, severe 04/29/2022   Cellulitis of right lower extremity 04/28/2022   Sepsis 10/12/2021   Peripheral vascular disease    AKI    Tobacco use    COPD (chronic obstructive pulmonary disease) (HCC)    BPH     Periodontal disease    IVDU, cocaine use    Demand ischemia    Thrombocytopenia    Normocytic anemia    MSSA bacteremia 10/11/2021   Callus 06/12/2021   Hav (hallux abducto valgus), unspecified laterality 06/12/2021   Hammer toes, bilateral 06/12/2021   Atherosclerosis of native arteries of extremity with intermittent claudication ([place]) 06/12/2021   Hyperlipidemia 06/07/2021   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   Elevated blood-pressure reading, without diagnosis of hypertension 09/26/2020   Body mass index (BMI) 29.0-29.9, adult 08/29/2020   Partial small bowel obstruction ([place]) 03/10/2020  Small bowel obstruction due to adhesions ([place]) 03/08/2020   Enteritis 10/15/2019   Abnormal liver function 10/15/2019   Leukocytosis 10/15/2019   Nausea & vomiting 10/15/2019   GAD (generalized anxiety disorder) 05/18/2017   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   Constipation 04/11/2017   Bee sting 04/05/2017   Diverticulosis 11/29/2014   Healthcare maintenance 11/06/2014   Cough 10/05/2014   Insomnia 09/13/2014   Erectile dysfunction 11/30/2013   Chronic pain syndrome 11/30/2013   Depression 11/30/2013   GERD (gastroesophageal reflux disease)    Hypertension    Anxiety    Arthritis    Osteoarthritis of lumbar spine    Radiation    Night sweats 08/12/2012   Tobacco abuse counseling 05/26/2012   Lymphocytic colitis 05/03/2012   Vitamin B12  deficiency 03/19/2012   Folate deficiency 03/19/2012   Inguinal hernia 11/24/2011   Cancer ([place]) 10/14/2011   Internal hemorrhoid 09/11/2011   Anal cancer 10/09/2010    Past Surgical History:  Procedure Laterality Date   APPENDECTOMY     age 63 or 47   EXAMINATION UNDER ANESTHESIA  10/14/2011   Procedure: EXAM UNDER ANESTHESIA;  Surgeon: Earnstine Regal, MD;  [facility];  Service: General;  Laterality: N/A;  exam under anethesia, Excision of mass anal canal, 1.5cm   INGUINAL HERNIA REPAIR  05/11/2012   Procedure: HERNIA REPAIR INGUINAL ADULT;  Surgeon: Earnstine Regal, MD;  Location: [place];  Service: General;  Laterality: Left;  left inguinal hernia repair with mesh   LOWER EXTREMITY ANGIOGRAPHY Left 07/02/2021   Procedure: LOWER EXTREMITY ANGIOGRAPHY;  Surgeon: Katha Cabal, MD;  Location: [place] [facility];  Service: Cardiovascular;  Laterality: Left;   LOWER EXTREMITY ANGIOGRAPHY Right 07/16/2021   Procedure: LOWER EXTREMITY ANGIOGRAPHY;  Surgeon: Katha Cabal, MD;  Location: [place] [facility];  Service: Cardiovascular;  Laterality: Right;   LOWER EXTREMITY ANGIOGRAPHY Left 08/27/2021   Procedure: LOWER EXTREMITY ANGIOGRAPHY;  Surgeon: Katha Cabal, MD;  Location: [place] [facility];  Service: Cardiovascular;  Laterality: Left;   LOWER EXTREMITY ANGIOGRAPHY Left 01/14/2022   Procedure: Lower Extremity Angiography;  Surgeon: Katha Cabal, MD;  Location: [place] [facility];  Service: Cardiovascular;  Laterality: Left;   SMALL INTESTINE SURGERY  04/19/2017   exploratory lap, partial small bowel resection for perforated ulcer/abcess, radiation enteritis   surgical pathology   10/14/2011   squamous cell ca of anus   TEE WITHOUT CARDIOVERSION N/A 10/16/2021   Procedure: TRANSESOPHAGEAL ECHOCARDIOGRAM (TEE);  Surgeon: [name], MD;  Location[facility];  Service: Cardiovascular;  Laterality: N/A;   TOOTH EXTRACTION N/A  12/04/2021   Procedure: DENTAL RESTORATION/EXTRACTIONS;  Surgeon: Diona Browner, DMD;  Location: [place];  Service: Oral Surgery;  Laterality: N/A;   transanal excision  10/14/2011   Dr.Todd Gerkin       Home Medications    Prior to Admission medications   Medication Sig Start Date End Date Taking? Authorizing Provider  HYDROcodone-acetaminophen (NORCO/VICODIN) 5-325 MG tablet Take 2 tablets by mouth every 6 (six) hours as needed. 09/22/22  Yes Nyoka Lint, PA-C  tiZANidine (ZANAFLEX) 4 MG tablet Take 1 tablet (4 mg total) by mouth every 6 (six) hours as needed for muscle spasms. 09/22/22  Yes Nyoka Lint, PA-C  albuterol (VENTOLIN HFA) 108 (90 Base) MCG/ACT inhaler Inhale 2 puffs into the lungs every 6 (six) hours as needed for wheezing or shortness of breath. 07/23/22   Hans Eden, NP  apixaban (ELIQUIS) 2.5 MG  TABS tablet Take 1 tablet (2.5 mg total) by mouth 2 (two) times daily. 01/14/22   Schnier, Dolores Lory, MD  benzonatate (TESSALON) 100 MG capsule Take 1 capsule (100 mg total) by mouth every 8 (eight) hours as needed for cough. 05/27/22   Teodora Medici, FNP  ciprofloxacin (CIPRO) 500 MG tablet Take 1 tablet (500 mg total) by mouth every 12 (twelve) hours. Patient not taking: Reported on 08/28/2022 06/04/22   Carmin Muskrat, MD  clopidogrel (PLAVIX) 75 MG tablet Take 1 tablet by mouth once daily 02/28/22   Schnier, Dolores Lory, MD  COMBIVENT RESPIMAT 20-100 MCG/ACT AERS respimat Inhale 1 puff into the lungs 4 (four) times daily. 08/21/22   [provider]  doxycycline (VIBRAMYCIN) 100 MG capsule Take 1 capsule (100 mg total) by mouth 2 (two) times daily. 08/28/22   Vanessa Kick, MD  econazole nitrate 1 % cream Apply topically daily. Patient taking differently: Apply 1 Application topically daily as needed (rash). 01/17/22   Nyoka Lint, PA-C  EPINEPHrine (EPIPEN 2-PAK) 0.3 mg/0.3 mL IJ SOAJ injection Inject 0.3 mg (1 pen) into the muscle as needed for anaphylaxis. 05/01/22    Raiford Noble Latif, DO  feeding supplement (ENSURE ENLIVE / ENSURE PLUS) LIQD Take 237 mLs by mouth 3 (three) times daily between meals. 05/01/22   Sheikh, Omair Latif, DO  fluticasone furoate-vilanterol (BREO ELLIPTA) 100-25 MCG/ACT AEPB Inhale 1 puff into the lungs daily. 01/22/22   Crain, Whitney L, PA  folic acid (FOLVITE) 1 MG tablet Take 1 tablet (1 mg total) by mouth daily. 05/01/22   Sheikh, Omair Latif, DO  ipratropium-albuterol (DUONEB) 0.5-2.5 (3) MG/3ML SOLN Take 3 mLs by nebulization every 6 (six) hours as needed. 05/27/22   Mound, Michele Rockers, FNP  METHADONE HCL PO Take 85 mg by mouth daily.    [provider]  Multiple Vitamin (MULTIVITAMIN WITH MINERALS) TABS tablet Take 1 tablet by mouth daily. 05/01/22   Sheikh, Georgina Quint Latif, DO  nicotine (NICODERM CQ - DOSED IN MG/24 HOURS) 21 mg/24hr patch Place 1 patch (21 mg total) onto the skin daily. 05/01/22   Sheikh, Omair Latif, DO  ondansetron (ZOFRAN) 4 MG tablet Take 1 tablet (4 mg total) by mouth every 6 (six) hours as needed for nausea. 05/01/22   Raiford Noble Latif, DO  predniSONE (DELTASONE) 20 MG tablet Take 2 tablets (40 mg total) by mouth daily. 08/28/22   Vanessa Kick, MD  senna-docusate (SENOKOT-S) 8.6-50 MG tablet Take 1 tablet by mouth at bedtime as needed for mild constipation. 05/01/22   Raiford Noble Latif, DO  tamsulosin (FLOMAX) 0.4 MG CAPS capsule Take 1 capsule (0.4 mg total) by mouth at bedtime. 09/16/22   Dorna Mai, MD  sucralfate (CARAFATE) 1 GM/10ML suspension Take 10 mLs (1 g total) by mouth 4 (four) times daily -  with meals and at bedtime. Patient not taking: Reported on 11/28/2019 10/21/19 06/13/20  Randal Buba, April, MD    Family History Family History  Problem Relation Age of Onset   Colon cancer Father    Asthma Father    Cancer Father        prostate   Hypertension Father    Asthma Mother    Hypertension Mother    Stomach cancer Neg Hx    Esophageal cancer Neg Hx     Social History Social History    Tobacco Use   Smoking status: Every Day    Packs/day: 1.00    Years: 47.00    Total pack years:  47.00    Types: Cigarettes   Smokeless tobacco: Never   Tobacco comments:    Cutting back>> Quit Smart card supplied  Vaping Use   Vaping Use: Never used  Substance Use Topics   Alcohol use: Not Currently    Comment: stopped years ago   Drug use: Not Currently    Types: Cocaine, Marijuana, Methamphetamines     Allergies   Aspirin, Yellow jacket venom [bee venom], Codeine, Ibuprofen, and Morphine and related   Review of Systems Review of Systems  Musculoskeletal:  Positive for back pain (lower back).     Physical Exam Triage Vital Signs ED Triage Vitals [09/22/22 1334]  Enc Vitals Group     BP 137/61     Pulse Rate 70     Resp 16     Temp 98 F (36.7 C)     Temp Source Oral     SpO2 98 %     Weight      Height      Head Circumference      Peak Flow      Pain Score 10     Pain Loc      Pain Edu?      Excl. in [place]?    No data found.  Updated Vital Signs BP 137/61 (BP Location: Left Arm)   Pulse 70   Temp 98 F (36.7 C) (Oral)   Resp 16   SpO2 98%   Visual Acuity Right Eye Distance:   Left Eye Distance:   Bilateral Distance:    Right Eye Near:   Left Eye Near:    Bilateral Near:     Physical Exam Constitutional:      Appearance: Normal appearance.  Abdominal:     General: Abdomen is flat. Bowel sounds are normal.     Palpations: Abdomen is soft.     Tenderness: There is no abdominal tenderness. There is no guarding or rebound.  Musculoskeletal:       Back:     Comments: Back: Pain is palpated along the L3-L5 area midline and bilateral paraspinous.  There is no unusual swelling or rash on the skin.  Range of motion is normal although with discomfort on movement, negative straight raise bilaterally.  Neurological:     Mental Status: He is alert.      UC Treatments / Results  Labs (all labs ordered are listed, but only abnormal results are  displayed) Labs Reviewed  POCT URINALYSIS DIP (MANUAL ENTRY)    EKG   Radiology No results found.  Procedures Procedures (including critical care time)  Medications Ordered in UC Medications - No data to display  Initial Impression / Assessment and Plan / UC Course  I have reviewed the triage vital signs and the nursing notes.  Pertinent labs & imaging results that were available during my care of the patient were reviewed by me and considered in my medical decision making (see chart for details).    Plan: 1.  The acute bilateral lower back pain will be treated with the following: A.  Zanaflex 4 mg every 6 hours on a regular basis to help decrease muscle spasm. B.  Vicodin tablets, 1-2 every 6-8 hours as needed for acute pain relief. 2.  Patient advised to follow-up with PCP return to urgent care as needed. Final Clinical Impressions(s) / UC Diagnoses   Final diagnoses:  Acute bilateral low back pain without sciatica  Frequency of urination     Discharge Instructions  Advised take the Vicodin tablets, 1-2 every 6-8 hours as needed for lower back pain and discomfort.  Advised take Zanaflex 4 mg every 6-8 hours to reduce muscle spasm and back irritability.  Advised to follow-up with PCP or return to urgent care as needed.    ED Prescriptions     Medication Sig Dispense Auth. Provider   HYDROcodone-acetaminophen (NORCO/VICODIN) 5-325 MG tablet Take 2 tablets by mouth every 6 (six) hours as needed. 18 tablet Nyoka Lint, PA-C   tiZANidine (ZANAFLEX) 4 MG tablet Take 1 tablet (4 mg total) by mouth every 6 (six) hours as needed for muscle spasms. 30 tablet Nyoka Lint, PA-C      I have reviewed the PDMP during this encounter.   Nyoka Lint, PA-C 09/22/22 1422

## 2022-09-22 NOTE — ED Triage Notes (Signed)
Pt c/o lower back pain onset ~ 3 days ago

## 2022-09-22 NOTE — Discharge Instructions (Addendum)
Advised take the Vicodin tablets, 1-2 every 6-8 hours as needed for lower back pain and discomfort.  Advised take Zanaflex 4 mg every 6-8 hours to reduce muscle spasm and back irritability.  Advised to follow-up with PCP or return to urgent care as needed.

## 2022-09-22 NOTE — Telephone Encounter (Signed)
Spoke with the patient and he is scheduled with Dr. Delana Meyer for a LLE angio with anesthesia on 10/07/22 with [facility]. Spoke with Maudie Mercury for anesthesia. Pre-procedure instructions were discussed and will be mailed.

## 2022-09-30 ENCOUNTER — Ambulatory Visit (INDEPENDENT_AMBULATORY_CARE_PROVIDER_SITE_OTHER): Payer: Medicare HMO

## 2022-09-30 ENCOUNTER — Encounter: Payer: Self-pay | Admitting: Orthopaedic Surgery

## 2022-09-30 ENCOUNTER — Ambulatory Visit (INDEPENDENT_AMBULATORY_CARE_PROVIDER_SITE_OTHER): Payer: Medicare HMO | Admitting: Orthopaedic Surgery

## 2022-09-30 DIAGNOSIS — M79641 Pain in right hand: Secondary | ICD-10-CM | POA: Diagnosis not present

## 2022-09-30 DIAGNOSIS — G8929 Other chronic pain: Secondary | ICD-10-CM | POA: Diagnosis not present

## 2022-09-30 DIAGNOSIS — M25512 Pain in left shoulder: Secondary | ICD-10-CM | POA: Diagnosis not present

## 2022-09-30 MED ORDER — BUPIVACAINE HCL 0.5 % IJ SOLN
3.0000 mL | INTRAMUSCULAR | Status: AC | PRN
  Administered 2022-09-30: 3 mL via INTRA_ARTICULAR

## 2022-09-30 MED ORDER — LIDOCAINE HCL 1 % IJ SOLN
3.0000 mL | INTRAMUSCULAR | Status: AC | PRN
  Administered 2022-09-30: 3 mL

## 2022-09-30 MED ORDER — METHYLPREDNISOLONE ACETATE 40 MG/ML IJ SUSP
40.0000 mg | INTRAMUSCULAR | Status: AC | PRN
  Administered 2022-09-30: 40 mg via INTRA_ARTICULAR

## 2022-09-30 NOTE — Progress Notes (Signed)
Office Visit Note   Patient: Martin [name]           Date of Birth: [birth year]           MRN: [id number] Visit Date: 09/30/2022              Requested by: Dorna Mai, [address] [place] [address] PCP: Camillia Herter, NP   Assessment & Plan: Visit Diagnoses:  1. Chronic left shoulder pain   2. Pain in right hand     Plan: Impression is chronic left shoulder pain likely underlying subacromial bursitis rotator cuff tendinitis in addition to soft tissue injury of the right hand with possible underlying carpal tunnel syndrome.  In regards to the left shoulder, we have discussed subacromial cortisone injection today which he agreed to and tolerated well.  In regards to the right hand, we will go ahead and order nerve conduction study to assess for carpal tunnel syndrome.  Also believe he has a soft tissue injury which should improve with time.  Follow-up after nerve conduction study.  Follow-Up Instructions: Return for f/u after NCS.   Orders:  Orders Placed This Encounter  Procedures   Large Joint Inj: L subacromial bursa   XR Shoulder Left   XR Hand Complete Right   Ambulatory referral to Physical Medicine Rehab   No orders of the defined types were placed in this encounter.     Procedures: Large Joint Inj: L subacromial bursa on 09/30/2022 12:40 PM Indications: pain Details: 22 G needle  Arthrogram: No  Medications: 3 mL lidocaine 1 %; 3 mL bupivacaine 0.5 %; 40 mg methylPREDNISolone acetate 40 MG/ML Outcome: tolerated well, no immediate complications Patient was prepped and draped in the usual sterile fashion.       Clinical Data: No additional findings.   Subjective: Chief Complaint  Patient presents with   Left Shoulder - Pain   Right Hand - Pain    HPI patient is a pleasant 62 year old gentleman who comes in today with left shoulder and right hand pain.  In regards to his left shoulder, has had pain for the past 3 months.   He denies any injury or change in activity.  The pain he has is throughout the entire shoulder and is worse with any forward flexion past 90 degrees as well as when he is lying on his side or bringing his arm across his back.  He has associated weakness.  He has been taking Norco which provides some relief.  No previous cortisone injection.  In regards to his right hand, he fell through a rotten deck with his right hand going through a screw.  He was seen by the doctor where x-rays were obtained.  X-rays demonstrated small fragments along the thenar eminence.  He has had pain to the palm of his hand since in addition to burning to the long and ring fingers.  Symptoms are worse with gripping.  He is unsure whether he took antibiotics following this injury.  Review of Systems as detailed in HPI.  All others reviewed and are negative.   Objective: Vital Signs: There were no vitals taken for this visit.  Physical Exam well-developed well-nourished gentleman in no acute distress.  Alert and oriented x 3.  Ortho Exam left shoulder exam reveals forward flexion to 170 degrees.  He can internally rotate to his back pocket.  Positive empty can test.  5 out of 5 strength throughout.  He is neurovascular  intact distally.  Right hand exam reveals mild tenderness along the thenar eminence.  No skin changes.  Full range of motion and strength of all 5 fingers.  He is neurovascular intact distally.  Specialty Comments:  No specialty comments available.  Imaging: XR Shoulder Left  Result Date: 09/30/2022 Mild to moderate AC degenerative changes  XR Hand Complete Right  Result Date: 09/30/2022 2 very small opaque fragments along the base of the thumb    PMFS History: Patient Active Problem List   Diagnosis Date Noted   Protein-calorie malnutrition, severe 04/29/2022   Cellulitis of right lower extremity 04/28/2022   Sepsis 10/12/2021   Peripheral vascular disease    AKI    Tobacco use    COPD  (chronic obstructive pulmonary disease) (HCC)    BPH     Periodontal disease    IVDU, cocaine use    Demand ischemia    Thrombocytopenia    Normocytic anemia    MSSA bacteremia 10/11/2021   Callus 06/12/2021   Hav (hallux abducto valgus), unspecified laterality 06/12/2021   Hammer toes, bilateral 06/12/2021   Atherosclerosis of native arteries of extremity with intermittent claudication ([place]) 06/12/2021   Hyperlipidemia 06/07/2021   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   Elevated blood-pressure reading, without diagnosis of hypertension 09/26/2020   Body mass index (BMI) 29.0-29.9, adult 08/29/2020   Partial small bowel obstruction ([place]) 03/10/2020   Small bowel obstruction due to adhesions ([place]) 03/08/2020   Enteritis 10/15/2019   Abnormal liver function 10/15/2019   Leukocytosis 10/15/2019   Nausea & vomiting 10/15/2019   GAD (generalized anxiety disorder) 05/18/2017   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   Constipation 04/11/2017   Bee sting 04/05/2017   Diverticulosis 11/29/2014   Healthcare maintenance 11/06/2014   Cough 10/05/2014   Insomnia 09/13/2014   Erectile dysfunction 11/30/2013   Chronic pain syndrome 11/30/2013   Depression 11/30/2013   GERD (gastroesophageal reflux disease)    Hypertension    Anxiety    Arthritis    Osteoarthritis of lumbar spine    Radiation    Night sweats 08/12/2012   Tobacco abuse counseling 05/26/2012   Lymphocytic colitis 05/03/2012   Vitamin B12 deficiency 03/19/2012   Folate deficiency 03/19/2012   Inguinal hernia 11/24/2011   Cancer ([place]) 10/14/2011   Internal hemorrhoid 09/11/2011   Anal cancer 10/09/2010   Past Medical History:  Diagnosis Date   Allergy 10/2011   Anal cancer ([place]) 10/14/2011   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   Peripheral vascular disease ([place])    Pneumonia    as child, cough at present with no fever   Pneumonia    as child, cough at present with no fever    Radiation 11/19/11-01/08/12   5040 cGy 28 fx Pelvis and inguinal area   Stroke ([place])    no weaknes or paralysis   Substance abuse (  [place])     Family History  Problem Relation Age of Onset   Colon cancer Father    Asthma Father    Cancer Father        prostate   Hypertension Father    Asthma Mother    Hypertension Mother    Stomach cancer Neg Hx    Esophageal cancer Neg Hx     Past Surgical History:  Procedure Laterality Date   APPENDECTOMY     age 65 or 78   EXAMINATION UNDER ANESTHESIA  10/14/2011   Procedure: EXAM UNDER ANESTHESIA;  Surgeon: Earnstine Regal, MD;  [facility];  Service: General;  Laterality: N/A;  exam under anethesia, Excision of mass anal canal, 1.5cm   INGUINAL HERNIA REPAIR  05/11/2012   Procedure: HERNIA REPAIR INGUINAL ADULT;  Surgeon: Earnstine Regal, MD;  Location: [place];  Service: General;  Laterality: Left;  left inguinal hernia repair with mesh   LOWER EXTREMITY ANGIOGRAPHY Left 07/02/2021   Procedure: LOWER EXTREMITY ANGIOGRAPHY;  Surgeon: Katha Cabal, MD;  Location: [place] [facility];  Service: Cardiovascular;  Laterality: Left;   LOWER EXTREMITY ANGIOGRAPHY Right 07/16/2021   Procedure: LOWER EXTREMITY ANGIOGRAPHY;  Surgeon: Katha Cabal, MD;  Location: [facility];  Service: Cardiovascular;  Laterality: Right;   LOWER EXTREMITY ANGIOGRAPHY Left 08/27/2021   Procedure: LOWER EXTREMITY ANGIOGRAPHY;  Surgeon: Katha Cabal, MD;  Location: [place] [facility];  Service: Cardiovascular;  Laterality: Left;   LOWER EXTREMITY ANGIOGRAPHY Left 01/14/2022   Procedure: Lower Extremity Angiography;  Surgeon: Katha Cabal, MD;  Location: [place] [facility];  Service: Cardiovascular;  Laterality: Left;   SMALL INTESTINE SURGERY  04/19/2017   exploratory lap, partial small bowel resection for perforated ulcer/abcess, radiation enteritis   surgical pathology   10/14/2011   squamous cell ca of anus   TEE WITHOUT CARDIOVERSION N/A 10/16/2021   Procedure: TRANSESOPHAGEAL ECHOCARDIOGRAM (TEE);  Surgeon: [name], MD;  Location: [facility] ENDOSCOPY;  Service: Cardiovascular;  Laterality: N/A;   TOOTH EXTRACTION N/A 12/04/2021   Procedure: DENTAL RESTORATION/EXTRACTIONS;  Surgeon: Diona Browner, DMD;  Location: [place];  Service: Oral Surgery;  Laterality: N/A;   transanal excision  10/14/2011   Dr.Todd Gerkin   Social History   Occupational History   Occupation: unemployed  Tobacco Use   Smoking status: Every Day    Packs/day: 1.00    Years: 47.00    Total pack years: 47.00    Types: Cigarettes   Smokeless tobacco: Never   Tobacco comments:    Cutting back>> Education officer, environmental card supplied  Scientific laboratory technician Use: Never used  Substance and Sexual Activity   Alcohol use: Not Currently    Comment: stopped years ago   Drug use: Not Currently    Types: Cocaine, Marijuana, Methamphetamines   Sexual activity: Yes    Birth control/protection: Condom

## 2022-10-03 ENCOUNTER — Encounter: Payer: Self-pay | Admitting: Certified Registered"

## 2022-10-06 DIAGNOSIS — F1120 Opioid dependence, uncomplicated: Secondary | ICD-10-CM | POA: Diagnosis not present

## 2022-10-07 ENCOUNTER — Encounter
Admission: RE | Disposition: A | Discharge status: Home / Self Care | Payer: Self-pay | Source: Home / Self Care | Attending: Vascular Surgery

## 2022-10-07 ENCOUNTER — Ambulatory Visit
Admission: RE | Admit: 2022-10-07 | Discharge: 2022-10-07 | Disposition: A | Discharge status: Home / Self Care | Payer: Medicare HMO | Attending: Vascular Surgery | Admitting: Vascular Surgery

## 2022-10-07 ENCOUNTER — Other Ambulatory Visit (INDEPENDENT_AMBULATORY_CARE_PROVIDER_SITE_OTHER): Payer: Self-pay | Admitting: Nurse Practitioner

## 2022-10-07 DIAGNOSIS — F1410 Cocaine abuse, uncomplicated: Secondary | ICD-10-CM

## 2022-10-07 DIAGNOSIS — I70229 Atherosclerosis of native arteries of extremities with rest pain, unspecified extremity: Secondary | ICD-10-CM

## 2022-10-07 LAB — URINE DRUG SCREEN, QUALITATIVE (ARMC ONLY)
Amphetamines, Ur Screen: NOT DETECTED
Barbiturates, Ur Screen: NOT DETECTED
Benzodiazepine, Ur Scrn: NOT DETECTED
Cannabinoid 50 Ng, Ur ~~LOC~~: NOT DETECTED
Cocaine Metabolite,Ur ~~LOC~~: POSITIVE — AB
MDMA (Ecstasy)Ur Screen: NOT DETECTED
Methadone Scn, Ur: POSITIVE — AB
Opiate, Ur Screen: NOT DETECTED
Phencyclidine (PCP) Ur S: NOT DETECTED
Tricyclic, Ur Screen: NOT DETECTED

## 2022-10-07 SURGERY — LOWER EXTREMITY ANGIOGRAPHY
Anesthesia: General | Site: Leg Lower | Laterality: Left

## 2022-10-07 MED ORDER — CLOPIDOGREL BISULFATE 75 MG PO TABS: 75.0000 mg | ORAL_TABLET | Freq: Every day | ORAL | 11 refills | Status: AC

## 2022-10-10 ENCOUNTER — Encounter: Payer: Medicare HMO | Admitting: Physical Medicine and Rehabilitation

## 2022-10-15 ENCOUNTER — Ambulatory Visit (INDEPENDENT_AMBULATORY_CARE_PROVIDER_SITE_OTHER): Payer: Medicare HMO | Admitting: Physical Medicine and Rehabilitation

## 2022-10-15 ENCOUNTER — Telehealth (INDEPENDENT_AMBULATORY_CARE_PROVIDER_SITE_OTHER): Payer: Self-pay

## 2022-10-15 DIAGNOSIS — R531 Weakness: Secondary | ICD-10-CM | POA: Diagnosis not present

## 2022-10-15 DIAGNOSIS — M25512 Pain in left shoulder: Secondary | ICD-10-CM

## 2022-10-15 DIAGNOSIS — G8929 Other chronic pain: Secondary | ICD-10-CM | POA: Diagnosis not present

## 2022-10-15 DIAGNOSIS — M79601 Pain in right arm: Secondary | ICD-10-CM | POA: Diagnosis not present

## 2022-10-15 DIAGNOSIS — R202 Paresthesia of skin: Secondary | ICD-10-CM

## 2022-10-15 DIAGNOSIS — M79641 Pain in right hand: Secondary | ICD-10-CM | POA: Diagnosis not present

## 2022-10-15 NOTE — Progress Notes (Signed)
Functional Pain Scale - descriptive words and definitions  Unmanageable (7)  Pain interferes with normal ADL's/nothing seems to help/sleep is very difficult/active distractions are very difficult to concentrate on. Severe range order  Average Pain 7    He states that his hand keeps him up at night with burning sensation and numbness. Fingers feel like they are on fire, decrease in strength and grip. He states that he did have a screw to puncture the base of his hand but was told that he did not hit a tendon.

## 2022-10-15 NOTE — Telephone Encounter (Signed)
Patient was scheduled for 10/14/22 and has been rescheduled to 11/04/22 due to sickness. Pre-procedure instructions were discussed and will be mailed.

## 2022-10-16 ENCOUNTER — Ambulatory Visit
Admission: RE | Admit: 2022-10-16 | Discharge: 2022-10-16 | Disposition: A | Discharge status: Home / Self Care | Payer: Medicare HMO | Source: Ambulatory Visit | Attending: Physician Assistant | Admitting: Physician Assistant

## 2022-10-16 VITALS — BP 170/54 | HR 71 | Temp 97.9°F | Resp 20

## 2022-10-16 DIAGNOSIS — L03115 Cellulitis of right lower limb: Secondary | ICD-10-CM

## 2022-10-16 DIAGNOSIS — L0291 Cutaneous abscess, unspecified: Secondary | ICD-10-CM | POA: Diagnosis not present

## 2022-10-16 MED ORDER — DOXYCYCLINE HYCLATE 100 MG PO CAPS: 100.0000 mg | ORAL_CAPSULE | Freq: Two times a day (BID) | ORAL | 0 refills | Status: DC

## 2022-10-16 NOTE — ED Triage Notes (Addendum)
Pt reports bit by spider that red area that has become painful over past 4-5 days on right upper medial leg. Denies drainage.

## 2022-10-16 NOTE — ED Provider Notes (Signed)
[name]    CSN: [id number] Arrival date & time: 10/16/22  1633      History   Chief Complaint No chief complaint on file.   HPI Martin [name] is a 62 y.o. male.   Patient here today for evaluation of possible spider bite to his right upper leg. He reports that he first noticed area of redness recently (last 4-5 days) that has spread and become more painful and swollen. He has not had any fever. HE has had some nausea but no vomiting.   The history is provided by the patient.    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   Peripheral vascular disease ([place])    Pneumonia    as child, cough at present with no fever   Pneumonia    as child, cough at present with no fever    Radiation 11/19/11-01/08/12   5040 cGy 28 fx Pelvis and inguinal area   Stroke ([place])    no weaknes or paralysis   Substance abuse [facility])     Patient Active Problem List   Diagnosis Date Noted   Protein-calorie malnutrition, severe 04/29/2022   Cellulitis of right lower extremity 04/28/2022   Sepsis 10/12/2021   Peripheral vascular disease    AKI    Tobacco use    COPD (chronic obstructive pulmonary disease) (HCC)    BPH     Periodontal disease    IVDU, cocaine use    Demand ischemia    Thrombocytopenia    Normocytic anemia    MSSA bacteremia 10/11/2021   Callus 06/12/2021   Hav (hallux  abducto valgus), unspecified laterality 06/12/2021   Hammer toes, bilateral 06/12/2021   Atherosclerosis of native arteries of extremity with intermittent claudication ([place]) 06/12/2021   Hyperlipidemia 06/07/2021   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   Elevated blood-pressure reading, without diagnosis of hypertension 09/26/2020   Body mass index (BMI) 29.0-29.9, adult 08/29/2020   Partial small bowel obstruction ([place]) 03/10/2020   Small bowel obstruction due to adhesions ([place]) 03/08/2020   Enteritis 10/15/2019   Abnormal liver function 10/15/2019   Leukocytosis 10/15/2019   Nausea & vomiting 10/15/2019   GAD (generalized anxiety disorder) 05/18/2017   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   Constipation 04/11/2017   Bee sting 04/05/2017   Diverticulosis 11/29/2014   Healthcare maintenance 11/06/2014   Cough 10/05/2014   Insomnia 09/13/2014   Erectile dysfunction 11/30/2013   Chronic pain syndrome 11/30/2013   Depression 11/30/2013   GERD (gastroesophageal reflux disease)    Hypertension    Anxiety    Arthritis    Osteoarthritis of lumbar spine    Radiation    Night sweats 08/12/2012   Tobacco abuse counseling 05/26/2012   Lymphocytic colitis 05/03/2012   Vitamin B12 deficiency 03/19/2012  Folate deficiency 03/19/2012   Inguinal hernia 11/24/2011   Cancer ([place]) 10/14/2011   Internal hemorrhoid 09/11/2011   Anal cancer 10/09/2010    Past Surgical History:  Procedure Laterality Date   APPENDECTOMY     age 12 or 30   EXAMINATION UNDER ANESTHESIA  10/14/2011   Procedure: EXAM UNDER ANESTHESIA;  Surgeon: Earnstine Regal, MD;  Location: [facility];  Service: General;  Laterality: N/A;  exam under anethesia, Excision of mass anal canal, 1.5cm   INGUINAL HERNIA REPAIR  05/11/2012   Procedure: HERNIA REPAIR INGUINAL ADULT;  Surgeon: Earnstine Regal, MD;  Location: [place];  Service: General;  Laterality: Left;  left inguinal hernia repair with mesh   LOWER  EXTREMITY ANGIOGRAPHY Left 07/02/2021   Procedure: LOWER EXTREMITY ANGIOGRAPHY;  Surgeon: Katha Cabal, MD;  Location: [place] [facility];  Service: Cardiovascular;  Laterality: Left;   LOWER EXTREMITY ANGIOGRAPHY Right 07/16/2021   Procedure: LOWER EXTREMITY ANGIOGRAPHY;  Surgeon: Katha Cabal, MD;  Location: [place] [facility];  Service: Cardiovascular;  Laterality: Right;   LOWER EXTREMITY ANGIOGRAPHY Left 08/27/2021   Procedure: LOWER EXTREMITY ANGIOGRAPHY;  Surgeon: Katha Cabal, MD;  Location: [place] [facility];  Service: Cardiovascular;  Laterality: Left;   LOWER EXTREMITY ANGIOGRAPHY Left 01/14/2022   Procedure: Lower Extremity Angiography;  Surgeon: Katha Cabal, MD;  Location: [place] [facility];  Service: Cardiovascular;  Laterality: Left;   SMALL INTESTINE SURGERY  04/19/2017   exploratory lap, partial small bowel resection for perforated ulcer/abcess, radiation enteritis   surgical pathology   10/14/2011   squamous cell ca of anus   TEE WITHOUT CARDIOVERSION N/A 10/16/2021   Procedure: TRANSESOPHAGEAL ECHOCARDIOGRAM (TEE);  Surgeon: [name], MD;  Location: [facility]COPY;  Service: Cardiovascular;  Laterality: N/A;   TOOTH EXTRACTION N/A 12/04/2021   Procedure: DENTAL RESTORATION/EXTRACTIONS;  Surgeon: Diona Browner, DMD;  Location: [place];  Service: Oral Surgery;  Laterality: N/A;   transanal excision  10/14/2011   Dr.Todd Gerkin       Home Medications    Prior to Admission medications   Medication Sig Start Date End Date Taking? Authorizing Provider  doxycycline (VIBRAMYCIN) 100 MG capsule Take 1 capsule (100 mg total) by mouth 2 (two) times daily. 10/16/22  Yes Francene Finders, PA-C  albuterol (VENTOLIN HFA) 108 (90 Base) MCG/ACT inhaler Inhale 2 puffs into the lungs every 6 (six) hours as needed for wheezing or shortness of breath. 07/23/22   White, Leitha Schuller, NP  apixaban (ELIQUIS) 2.5 MG TABS tablet Take 1 tablet (2.5 mg total) by  mouth 2 (two) times daily. 01/14/22   Schnier, Dolores Lory, MD  clopidogrel (PLAVIX) 75 MG tablet Take 1 tablet (75 mg total) by mouth daily. 10/07/22   Kris Hartmann, NP  COMBIVENT RESPIMAT 20-100 MCG/ACT AERS respimat Inhale 1 puff into the lungs 4 (four) times daily. 08/21/22   [provider]  econazole nitrate 1 % cream Apply topically daily. Patient taking differently: Apply 1 Application topically daily as needed (rash). 01/17/22   Nyoka Lint, PA-C  EPINEPHrine (EPIPEN 2-PAK) 0.3 mg/0.3 mL IJ SOAJ injection Inject 0.3 mg (1 pen) into the muscle as needed for anaphylaxis. 05/01/22   Raiford Noble Latif, DO  feeding supplement (ENSURE ENLIVE / ENSURE PLUS) LIQD Take 237 mLs by mouth 3 (three) times daily between meals. 05/01/22   Sheikh, Omair Latif, DO  fluticasone furoate-vilanterol (BREO ELLIPTA) 100-25 MCG/ACT AEPB Inhale 1 puff into the lungs  daily. 01/22/22   Geryl Councilman L, PA  folic acid (FOLVITE) 1 MG tablet Take 1 tablet (1 mg total) by mouth daily. 05/01/22   Raiford Noble Latif, DO  HYDROcodone-acetaminophen (NORCO/VICODIN) 5-325 MG tablet Take 2 tablets by mouth every 6 (six) hours as needed. 09/22/22   Nyoka Lint, PA-C  ipratropium-albuterol (DUONEB) 0.5-2.5 (3) MG/3ML SOLN Take 3 mLs by nebulization every 6 (six) hours as needed. 05/27/22   [place], Michele Rockers, FNP  METHADONE HCL PO Take 85 mg by mouth daily.    [provider]  Multiple Vitamin (MULTIVITAMIN WITH MINERALS) TABS tablet Take 1 tablet by mouth daily. 05/01/22   Sheikh, Georgina Quint Latif, DO  nicotine (NICODERM CQ - DOSED IN MG/24 HOURS) 21 mg/24hr patch Place 1 patch (21 mg total) onto the skin daily. 05/01/22   Sheikh, Omair Latif, DO  ondansetron (ZOFRAN) 4 MG tablet Take 1 tablet (4 mg total) by mouth every 6 (six) hours as needed for nausea. 05/01/22   Sheikh, Omair Latif, DO  senna-docusate (SENOKOT-S) 8.6-50 MG tablet Take 1 tablet by mouth at bedtime as needed for mild constipation. 05/01/22   Raiford Noble  Latif, DO  tamsulosin (FLOMAX) 0.4 MG CAPS capsule Take 1 capsule (0.4 mg total) by mouth at bedtime. 09/16/22   Dorna Mai, MD  tiZANidine (ZANAFLEX) 4 MG tablet Take 1 tablet (4 mg total) by mouth every 6 (six) hours as needed for muscle spasms. 09/22/22   Nyoka Lint, PA-C  sucralfate (CARAFATE) 1 GM/10ML suspension Take 10 mLs (1 g total) by mouth 4 (four) times daily -  with meals and at bedtime. Patient not taking: Reported on 11/28/2019 10/21/19 06/13/20  Randal Buba, April, MD    Family History Family History  Problem Relation Age of Onset   Colon cancer Father    Asthma Father    Cancer Father        prostate   Hypertension Father    Asthma Mother    Hypertension Mother    Stomach cancer Neg Hx    Esophageal cancer Neg Hx     Social History Social History   Tobacco Use   Smoking status: Every Day    Packs/day: 1.00    Years: 47.00    Total pack years: 47.00    Types: Cigarettes   Smokeless tobacco: Never   Tobacco comments:    Cutting back>> Quit Smart card supplied  Scientific laboratory technician Use: Never used  Substance Use Topics   Alcohol use: Not Currently    Comment: stopped years ago   Drug use: Not Currently    Types: Cocaine, Marijuana, Methamphetamines     Allergies   Aspirin, Yellow jacket venom [bee venom], Codeine, Ibuprofen, and Morphine and related   Review of Systems Review of Systems  Constitutional:  Negative for chills and fever.  Eyes:  Negative for discharge and redness.  Skin:  Positive for color change and wound.  Neurological:  Negative for numbness.     Physical Exam Triage Vital Signs ED Triage Vitals  Enc Vitals Group     BP 10/16/22 1642 (!) 170/54     Pulse Rate 10/16/22 1642 71     Resp 10/16/22 1642 20     Temp 10/16/22 1642 97.9 F (36.6 C)     Temp Source 10/16/22 1642 Oral     SpO2 10/16/22 1642 98 %     Weight --      Height --      Head Circumference --  Peak Flow --      Pain Score 10/16/22 1640 10     Pain  Loc --      Pain Edu? --      Excl. in [place]? --    No data found.  Updated Vital Signs BP (!) 170/54 (BP Location: Left Arm)   Pulse 71   Temp 97.9 F (36.6 C) (Oral)   Resp 20   SpO2 98%      Physical Exam Vitals and nursing note reviewed.  Constitutional:      General: He is not in acute distress.    Appearance: Normal appearance. He is not ill-appearing.  HENT:     Head: Normocephalic and atraumatic.  Eyes:     Conjunctiva/sclera: Conjunctivae normal.  Cardiovascular:     Rate and Rhythm: Normal rate.  Pulmonary:     Effort: Pulmonary effort is normal. No respiratory distress.  Skin:    Comments: Pinpoint wound to right posterior upper leg with surrounding erythema, swelling  Neurological:     Mental Status: He is alert.  Psychiatric:        Mood and Affect: Mood normal.        Behavior: Behavior normal.        Thought Content: Thought content normal.      UC Treatments / Results  Labs (all labs ordered are listed, but only abnormal results are displayed) Labs Reviewed - No data to display  EKG   Radiology No results found.  Procedures Procedures (including critical care time)  Medications Ordered in UC Medications - No data to display  Initial Impression / Assessment and Plan / UC Course  I have reviewed the triage vital signs and the nursing notes.  Pertinent labs & imaging results that were available during my care of the patient were reviewed by me and considered in my medical decision making (see chart for details).    Will treat to cover abscess and cellulitis with doxycycline. Encouraged follow up if symptoms do not improve or if symptoms worsen in any way recommend evaluation in the ED given PMH.   Final Clinical Impressions(s) / UC Diagnoses   Final diagnoses:  Abscess  Cellulitis of right lower extremity   Discharge Instructions   None    ED Prescriptions     Medication Sig Dispense Auth. Provider   doxycycline (VIBRAMYCIN) 100  MG capsule Take 1 capsule (100 mg total) by mouth 2 (two) times daily. 20 capsule Francene Finders, PA-C      PDMP not reviewed this encounter.   Francene Finders, PA-C 10/16/22 1800

## 2022-10-20 DIAGNOSIS — F1120 Opioid dependence, uncomplicated: Secondary | ICD-10-CM | POA: Diagnosis not present

## 2022-10-20 NOTE — Procedures (Unsigned)
EMG & NCV Findings: Evaluation of the right median motor nerve showed decreased conduction velocity (Elbow-Wrist, 49 m/s).  The right median (across palm) sensory nerve showed prolonged distal peak latency (Wrist, 4.2 ms) and prolonged distal peak latency (Palm, 2.2 ms).  All remaining nerves (as indicated in the following tables) were within normal limits.    Impression: The above electrodiagnostic study is ABNORMAL and reveals evidence of a mild to moderate right median nerve entrapment at the wrist (carpal tunnel syndrome) affecting sensory and motor components. There is no significant electrodiagnostic evidence of any other focal nerve entrapment, brachial plexopathy or cervical radiculopathy.   Recommendations: 1.  Follow-up with referring physician. 2.  Continue current management of symptoms. 3.  Continue use of resting splint at night-time and as needed during the day. 4.  Suggest surgical evaluation.  ___________________________ Laurence Spates FAAPMR Board Certified, American Board of Physical Medicine and Rehabilitation    Nerve Conduction Studies Anti Sensory Summary Table   Stim Site NR Peak (ms) Norm Peak (ms) P-T Amp (V) Norm P-T Amp Site1 Site2 Delta-P (ms) Dist (cm) Vel (m/s) Norm Vel (m/s)  Right Median Acr Palm Anti Sensory (2nd Digit)  30C  Wrist    *4.2 <3.6 21.0 >10 Wrist Palm 2.0 0.0    Palm    *2.2 <2.0 13.6         Right Radial Anti Sensory (Base 1st Digit)  30.8C  Wrist    2.4 <3.1 23.5  Wrist Base 1st Digit 2.4 0.0    Right Ulnar Anti Sensory (5th Digit)  30.8C  Wrist    3.6 <3.7 18.3 >15.0 Wrist 5th Digit 3.6 14.0 39 >38   Motor Summary Table   Stim Site NR Onset (ms) Norm Onset (ms) O-P Amp (mV) Norm O-P Amp Site1 Site2 Delta-0 (ms) Dist (cm) Vel (m/s) Norm Vel (m/s)  Right Median Motor (Abd Poll Brev)  31.1C  Wrist    4.1 <4.2 5.6 >5 Elbow Wrist 4.7 23.0 *49 >50  Elbow    8.8  5.4         Right Ulnar Motor (Abd Dig Min)  31.5C  Wrist    3.0 <4.2  7.0 >3 B Elbow Wrist 4.0 22.0 55 >53  B Elbow    7.0  7.5  A Elbow B Elbow 1.7 10.0 59 >53  A Elbow    8.7  7.5           Nerve Conduction Studies Anti Sensory Left/Right Comparison   Stim Site L Lat (ms) R Lat (ms) L-R Lat (ms) L Amp (V) R Amp (V) L-R Amp (%) Site1 Site2 L Vel (m/s) R Vel (m/s) L-R Vel (m/s)  Median Acr Palm Anti Sensory (2nd Digit)  30C  Wrist  *4.2   21.0  Wrist Palm     Palm  *2.2   13.6        Radial Anti Sensory (Base 1st Digit)  30.8C  Wrist  2.4   23.5  Wrist Base 1st Digit     Ulnar Anti Sensory (5th Digit)  30.8C  Wrist  3.6   18.3  Wrist 5th Digit  39    Motor Left/Right Comparison   Stim Site L Lat (ms) R Lat (ms) L-R Lat (ms) L Amp (mV) R Amp (mV) L-R Amp (%) Site1 Site2 L Vel (m/s) R Vel (m/s) L-R Vel (m/s)  Median Motor (Abd Poll Brev)  31.1C  Wrist  4.1   5.6  Elbow Wrist  *49  Elbow  8.8   5.4        Ulnar Motor (Abd Dig Min)  31.5C  Wrist  3.0   7.0  B Elbow Wrist  55   B Elbow  7.0   7.5  A Elbow B Elbow  59   A Elbow  8.7   7.5           Waveforms:

## 2022-10-21 NOTE — Progress Notes (Signed)
Martin [name] - 62 y.o. male MRN [id number]  Date of birth: Mar 31, 1961  Office Visit Note: Visit Date: 10/15/2022 PCP: Camillia Herter, NP Referred by: Leandrew Koyanagi, MD  Subjective: Chief Complaint  Patient presents with   Right Hand - Numbness   HPI: Martin [name] is a 62 y.o. male who comes in today at the request of Dr. Eduard Roux for evaluation and management of chronic, worsening and severe pain, numbness and tingling in the Right upper extremities.  Patient is Right hand dominant.  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  Chronic pain syndrome.  Saw Dr. Erlinda Hong with complaints of left shoulder pain and impingement but also right hand pain with paresthesias and dysesthesia in the whole hand globally.  He describes the pain in his hand as the fingers feel like they are on fire.  This is again global all digits.  He reports a decrease in strength with focal weakness.  Does have some pain in the arm but not necessarily focal radicular pain.  No left-sided complaints.  Some neck pain.  No history of cervical surgery.  He reports an injury where he fell through some decking and had a screw punctured the base of his hand.  He had x-rays at the time but no surgery.  His pain is ongoing despite the use of opioid medications and other medications.     Review of Systems  Musculoskeletal:  Positive for joint pain.  Neurological:  Positive for tingling and weakness.  All other systems reviewed and are negative.  Otherwise per HPI.  Assessment & Plan: Visit Diagnoses:    ICD-10-CM   1. Paresthesia of skin  R20.2 NCV with EMG (electromyography)    2. Pain in right hand  M79.641     3. Chronic left shoulder pain  M25.512    G89.29     4. Right arm pain  M79.601     5. Weakness  R53.1        Plan:  Impression: Complicated clinical  picture with nondermatomal hand pain with paresthesia dysesthesia globally in the setting of multiple joint pains in general and chronic pain syndrome on chronic methadone treatment.  Electrodiagnostic study performed.  The above electrodiagnostic study is ABNORMAL and reveals evidence of a mild to moderate right median nerve entrapment at the wrist (carpal tunnel syndrome) affecting sensory and motor components. There is no significant electrodiagnostic evidence of any other focal nerve entrapment, brachial plexopathy or cervical radiculopathy.   Recommendations: 1.  Follow-up with referring physician. 2.  Continue current management of symptoms. 3.  Continue use of resting splint at night-time and as needed during the day. 4.  Suggest surgical evaluation.  Meds & Orders: No orders of the defined types were placed in this encounter.   Orders Placed This Encounter  Procedures   NCV with EMG (electromyography)    Follow-up: Return for  Eduard Roux, MD.   Procedures: No procedures performed  EMG & NCV Findings: Evaluation of the right median motor nerve showed decreased conduction velocity (Elbow-Wrist, 49 m/s).  The right median (across palm) sensory nerve showed prolonged distal peak latency (Wrist, 4.2 ms) and prolonged distal peak latency (Palm, 2.2 ms).  All remaining nerves (as indicated in the following tables) were within normal limits.    Impression: The above electrodiagnostic study is ABNORMAL and  reveals evidence of a mild to moderate right median nerve entrapment at the wrist (carpal tunnel syndrome) affecting sensory and motor components. There is no significant electrodiagnostic evidence of any other focal nerve entrapment, brachial plexopathy or cervical radiculopathy.   Recommendations: 1.  Follow-up with referring physician. 2.  Continue current management of symptoms. 3.  Continue use of resting splint at night-time and as needed during the day. 4.  Suggest surgical  evaluation.  ___________________________ Laurence Spates FAAPMR Board Certified, American Board of Physical Medicine and Rehabilitation    Nerve Conduction Studies Anti Sensory Summary Table   Stim Site NR Peak (ms) Norm Peak (ms) P-T Amp (V) Norm P-T Amp Site1 Site2 Delta-P (ms) Dist (cm) Vel (m/s) Norm Vel (m/s)  Right Median Acr Palm Anti Sensory (2nd Digit)  30C  Wrist    *4.2 <3.6 21.0 >10 Wrist Palm 2.0 0.0    Palm    *2.2 <2.0 13.6         Right Radial Anti Sensory (Base 1st Digit)  30.8C  Wrist    2.4 <3.1 23.5  Wrist Base 1st Digit 2.4 0.0    Right Ulnar Anti Sensory (5th Digit)  30.8C  Wrist    3.6 <3.7 18.3 >15.0 Wrist 5th Digit 3.6 14.0 39 >38   Motor Summary Table   Stim Site NR Onset (ms) Norm Onset (ms) O-P Amp (mV) Norm O-P Amp Site1 Site2 Delta-0 (ms) Dist (cm) Vel (m/s) Norm Vel (m/s)  Right Median Motor (Abd Poll Brev)  31.1C  Wrist    4.1 <4.2 5.6 >5 Elbow Wrist 4.7 23.0 *49 >50  Elbow    8.8  5.4         Right Ulnar Motor (Abd Dig Min)  31.5C  Wrist    3.0 <4.2 7.0 >3 B Elbow Wrist 4.0 22.0 55 >53  B Elbow    7.0  7.5  A Elbow B Elbow 1.7 10.0 59 >53  A Elbow    8.7  7.5           Nerve Conduction Studies Anti Sensory Left/Right Comparison   Stim Site L Lat (ms) R Lat (ms) L-R Lat (ms) L Amp (V) R Amp (V) L-R Amp (%) Site1 Site2 L Vel (m/s) R Vel (m/s) L-R Vel (m/s)  Median Acr Palm Anti Sensory (2nd Digit)  30C  Wrist  *4.2   21.0  Wrist Palm     Palm  *2.2   13.6        Radial Anti Sensory (Base 1st Digit)  30.8C  Wrist  2.4   23.5  Wrist Base 1st Digit     Ulnar Anti Sensory (5th Digit)  30.8C  Wrist  3.6   18.3  Wrist 5th Digit  39    Motor Left/Right Comparison   Stim Site L Lat (ms) R Lat (ms) L-R Lat (ms) L Amp (mV) R Amp (mV) L-R Amp (%) Site1 Site2 L Vel (m/s) R Vel (m/s) L-R Vel (m/s)  Median Motor (Abd Poll Brev)  31.1C  Wrist  4.1   5.6  Elbow Wrist  *49   Elbow  8.8   5.4        Ulnar Motor (Abd Dig Min)  31.5C  Wrist  3.0    7.0  B Elbow Wrist  55   B Elbow  7.0   7.5  A Elbow B Elbow  59   A Elbow  8.7   7.5  Waveforms:             Clinical History: No specialty comments available.   He reports that he has been smoking cigarettes. He has a 47.00 pack-year smoking history. He has never used smokeless tobacco. No results for input(s): "HGBA1C", "LABURIC" in the last 8760 hours.  Objective:  VS:  HT:    WT:   BMI:     BP:   HR: bpm  TEMP: ( )  RESP:  Physical Exam Vitals and nursing note reviewed.  Constitutional:      General: He is not in acute distress.    Appearance: Normal appearance. He is well-developed.  HENT:     Head: Normocephalic and atraumatic.  Eyes:     Conjunctiva/sclera: Conjunctivae normal.     Pupils: Pupils are equal, round, and reactive to light.  Cardiovascular:     Rate and Rhythm: Normal rate.     Pulses: Normal pulses.     Heart sounds: Normal heart sounds.  Pulmonary:     Effort: Pulmonary effort is normal. No respiratory distress.  Musculoskeletal:        General: Tenderness present.     Cervical back: Normal range of motion and neck supple. Tenderness present. No rigidity.     Right lower leg: No edema.     Left lower leg: No edema.     Comments: Inspection reveals no atrophy of the bilateral APB or FDI or hand intrinsics. There is no swelling, color changes, allodynia or dystrophic changes. There is 5 out of 5 strength in the bilateral wrist extension, finger abduction and long finger flexion. There is intact sensation to light touch in all dermatomal and peripheral nerve distributions. There is a negative Froment's test bilaterally. There is a negative Tinel's test at the bilateral wrist and elbow. There is a positive Phalen's test on the right. There is a negative Hoffmann's test bilaterally.  Skin:    General: Skin is warm and dry.     Findings: No erythema or rash.  Neurological:     General: No focal deficit present.     Mental Status: He is  alert and oriented to person, place, and time.     Cranial Nerves: No cranial nerve deficit.     Sensory: No sensory deficit.     Motor: No weakness or abnormal muscle tone.     Coordination: Coordination normal.     Gait: Gait normal.  Psychiatric:        Mood and Affect: Mood normal.        Behavior: Behavior normal.        Thought Content: Thought content normal.     Ortho Exam  Imaging: No results found.  Past Medical/Family/Surgical/Social History: Medications & Allergies reviewed per EMR, new medications updated. Patient Active Problem List   Diagnosis Date Noted   Protein-calorie malnutrition, severe 04/29/2022   Cellulitis of right lower extremity 04/28/2022   Sepsis 10/12/2021   Peripheral vascular disease    AKI    Tobacco use    COPD (chronic obstructive pulmonary disease) (HCC)    BPH     Periodontal disease    IVDU, cocaine use    Demand ischemia    Thrombocytopenia    Normocytic anemia    MSSA bacteremia 10/11/2021   Callus 06/12/2021   Hav (hallux abducto valgus), unspecified laterality 06/12/2021   Hammer toes, bilateral 06/12/2021   Atherosclerosis of native arteries of extremity with intermittent claudication ([place]) 06/12/2021   Hyperlipidemia 06/07/2021  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   Elevated blood-pressure reading, without diagnosis of hypertension 09/26/2020   Body mass index (BMI) 29.0-29.9, adult 08/29/2020   Partial small bowel obstruction ([place]) 03/10/2020   Small bowel obstruction due to adhesions (HCC) 03/08/2020   Enteritis 10/15/2019   Abnormal liver function 10/15/2019   Leukocytosis 10/15/2019   Nausea & vomiting 10/15/2019   GAD (generalized anxiety disorder) 05/18/2017   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   Constipation 04/11/2017   Bee sting 04/05/2017   Diverticulosis 11/29/2014   Healthcare maintenance 11/06/2014   Cough 10/05/2014   Insomnia 09/13/2014   Erectile dysfunction 11/30/2013   Chronic pain  syndrome 11/30/2013   Depression 11/30/2013   GERD (gastroesophageal reflux disease)    Hypertension    Anxiety    Arthritis    Osteoarthritis of lumbar spine    Radiation    Night sweats 08/12/2012   Tobacco abuse counseling 05/26/2012   Lymphocytic colitis 05/03/2012   Vitamin B12 deficiency 03/19/2012   Folate deficiency 03/19/2012   Inguinal hernia 11/24/2011   Cancer ([place]) 10/14/2011   Internal hemorrhoid 09/11/2011   Anal cancer 10/09/2010   Past Medical History:  Diagnosis Date   Allergy 10/2011   Anal cancer ([place]) 10/14/2011   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   Peripheral vascular disease ([place])    Pneumonia    as child, cough at present with no fever   Pneumonia    as child, cough at present with no fever    Radiation 11/19/11-01/08/12   5040 cGy 28 fx Pelvis and inguinal area   Stroke ([place])    no weaknes or paralysis   Substance abuse ([place])    Family History  Problem Relation Age of Onset   Colon cancer Father    Asthma Father    Cancer Father        prostate   Hypertension Father    Asthma Mother    Hypertension Mother    Stomach cancer Neg Hx    Esophageal cancer Neg Hx    Past Surgical History:  Procedure Laterality Date   APPENDECTOMY     age 15 or 50   EXAMINATION UNDER ANESTHESIA  10/14/2011   Procedure: EXAM UNDER ANESTHESIA;  Surgeon: Earnstine Regal, MD;  Location: [facility];  Service: General;  Laterality: N/A;  exam under anethesia,  Excision of mass anal canal, 1.5cm   INGUINAL HERNIA REPAIR  05/11/2012   Procedure: HERNIA REPAIR INGUINAL ADULT;  Surgeon: Earnstine Regal, MD;  Location: [facility];  Service: General;  Laterality: Left;  left inguinal hernia repair with mesh   LOWER EXTREMITY ANGIOGRAPHY Left 07/02/2021   Procedure: LOWER EXTREMITY ANGIOGRAPHY;  Surgeon: Katha Cabal, MD;  Location: [facility];  Service: Cardiovascular;  Laterality: Left;   LOWER EXTREMITY ANGIOGRAPHY Right 07/16/2021   Procedure: LOWER EXTREMITY ANGIOGRAPHY;  Surgeon: Katha Cabal, MD;  Location: [facility];  Service: Cardiovascular;  Laterality: Right;   LOWER EXTREMITY ANGIOGRAPHY Left 08/27/2021   Procedure: LOWER EXTREMITY ANGIOGRAPHY;  Surgeon: Katha Cabal, MD;  Location: [facility];  Service: Cardiovascular;  Laterality: Left;   LOWER EXTREMITY ANGIOGRAPHY Left 01/14/2022   Procedure: Lower Extremity Angiography;  Surgeon: Katha Cabal, MD;  Location: [facility];  Service: Cardiovascular;  Laterality: Left;   SMALL INTESTINE SURGERY  04/19/2017   exploratory lap, partial small bowel resection for perforated ulcer/abcess, radiation enteritis   surgical pathology   10/14/2011   squamous cell ca of anus   TEE WITHOUT CARDIOVERSION N/A 10/16/2021   Procedure: TRANSESOPHAGEAL ECHOCARDIOGRAM (TEE);  Surgeon: [name], MD;  Location: [facility];  Service: Cardiovascular;  Laterality: N/A;   TOOTH EXTRACTION N/A 12/04/2021   Procedure: DENTAL RESTORATION/EXTRACTIONS;  Surgeon: Diona Browner, DMD;  Location: [facility];  Service: Oral Surgery;  Laterality: N/A;   transanal excision  10/14/2011   Dr.Todd Gerkin   Social History   Occupational History   Occupation: unemployed  Tobacco Use   Smoking status: Every Day    Packs/day: 1.00    Years: 47.00    Total pack years: 47.00    Types: Cigarettes   Smokeless tobacco: Never   Tobacco comments:    Cutting  back>> Education officer, environmental card supplied  Scientific laboratory technician Use: Never used  Substance and Sexual Activity   Alcohol use: Not Currently    Comment: stopped years ago   Drug use: Not Currently    Types: Cocaine, Marijuana, Methamphetamines   Sexual activity: Yes    Birth control/protection: Condom

## 2022-10-27 DIAGNOSIS — F1120 Opioid dependence, uncomplicated: Secondary | ICD-10-CM | POA: Diagnosis not present

## 2022-10-28 ENCOUNTER — Ambulatory Visit (INDEPENDENT_AMBULATORY_CARE_PROVIDER_SITE_OTHER): Payer: Medicare HMO | Admitting: Orthopaedic Surgery

## 2022-10-28 ENCOUNTER — Encounter: Payer: Self-pay | Admitting: Orthopaedic Surgery

## 2022-10-28 DIAGNOSIS — G5601 Carpal tunnel syndrome, right upper limb: Secondary | ICD-10-CM | POA: Diagnosis not present

## 2022-10-28 MED ORDER — LIDOCAINE HCL 1 % IJ SOLN
1.0000 mL | INTRAMUSCULAR | Status: AC | PRN
  Administered 2022-10-28: 1 mL

## 2022-10-28 MED ORDER — BUPIVACAINE HCL 0.5 % IJ SOLN
1.0000 mL | INTRAMUSCULAR | Status: AC | PRN
  Administered 2022-10-28: 1 mL

## 2022-10-28 MED ORDER — METHYLPREDNISOLONE ACETATE 40 MG/ML IJ SUSP
40.0000 mg | INTRAMUSCULAR | Status: AC | PRN
  Administered 2022-10-28: 40 mg

## 2022-10-28 NOTE — Progress Notes (Signed)
Office Visit Note   Patient: Martin [name]           Date of Birth: Nov 02, 1960           MRN: [id number] Visit Date: 10/28/2022              Requested by: Camillia Herter, NP [address],  [place] [id number] PCP: Camillia Herter, NP   Assessment & Plan: Visit Diagnoses:  1. Right carpal tunnel syndrome     Plan: EMG show moderate carpal tunnel syndrome.  Disease process discussed and treatment options were explained.  Ultimately he would like to undergo surgery however he has an upcoming angiogram scheduled for next week which takes precedence over the carpal tunnel syndrome.  We did a cortisone injection today which hopefully will give him relief in the meantime.  He will continue to wear carpal tunnel splints at nighttime.  He will call us back when he has been released by the vascular surgeons to do the carpal tunnel surgery.  Follow-Up Instructions: No follow-ups on file.   Orders:  No orders of the defined types were placed in this encounter.  No orders of the defined types were placed in this encounter.     Procedures: Hand/UE Inj: R carpal tunnel for carpal tunnel syndrome on 10/28/2022 9:01 AM Indications: pain Details: 25 G needle Medications: 1 mL lidocaine 1 %; 1 mL bupivacaine 0.5 %; 40 mg methylPREDNISolone acetate 40 MG/ML Outcome: tolerated well, no immediate complications Patient was prepped and draped in the usual sterile fashion.       Clinical Data: No additional findings.   Subjective: Chief Complaint  Patient presents with   Right Hand - Follow-up    EMG review    HPI  Patient returns today to discuss EMG results.  Review of Systems   Objective: Vital Signs: There were no vitals taken for this visit.  Physical Exam  Ortho Exam  Examination of right hand is unchanged.  Specialty Comments:  No specialty comments available.  Imaging: No results found.   PMFS History: Patient Active Problem List    Diagnosis Date Noted   Right carpal tunnel syndrome 10/28/2022   Protein-calorie malnutrition, severe 04/29/2022   Cellulitis of right lower extremity 04/28/2022   Sepsis 10/12/2021   Peripheral vascular disease    AKI    Tobacco use    COPD (chronic obstructive pulmonary disease) (HCC)    BPH     Periodontal disease    IVDU, cocaine use    Demand ischemia    Thrombocytopenia    Normocytic anemia    MSSA bacteremia 10/11/2021   Callus 06/12/2021   Hav (hallux abducto valgus), unspecified laterality 06/12/2021   Hammer toes, bilateral 06/12/2021   Atherosclerosis of native arteries of extremity with intermittent claudication ([place]) 06/12/2021   Hyperlipidemia 06/07/2021   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   Elevated blood-pressure reading, without diagnosis of hypertension 09/26/2020   Body mass index (BMI) 29.0-29.9, adult 08/29/2020   Partial small bowel obstruction ([place]) 03/10/2020   Small bowel obstruction due to adhesions ([place]) 03/08/2020   Enteritis 10/15/2019   Abnormal liver function 10/15/2019   Leukocytosis 10/15/2019   Nausea & vomiting 10/15/2019   GAD (generalized anxiety disorder) 05/18/2017   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   Constipation 04/11/2017   Bee sting 04/05/2017   Diverticulosis 11/29/2014   Healthcare maintenance 11/06/2014   Cough 10/05/2014   Insomnia 09/13/2014   Erectile dysfunction 11/30/2013  Chronic pain syndrome 11/30/2013   Depression 11/30/2013   GERD (gastroesophageal reflux disease)    Hypertension    Anxiety    Arthritis    Osteoarthritis of lumbar spine    Radiation    Night sweats 08/12/2012   Tobacco abuse counseling 05/26/2012   Lymphocytic colitis 05/03/2012   Vitamin B12 deficiency 03/19/2012   Folate deficiency 03/19/2012   Inguinal hernia 11/24/2011   Cancer ([place]) 10/14/2011   Internal hemorrhoid 09/11/2011   Anal cancer 10/09/2010   Past Medical History:  Diagnosis Date   Allergy 10/2011    Anal cancer ([place]) 10/14/2011   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   Peripheral vascular disease ([place])    Pneumonia    as child, cough at present with no fever   Pneumonia    as child, cough at present with no fever    Radiation 11/19/11-01/08/12   5040 cGy 28 fx Pelvis and inguinal area   Stroke ([place])    no weaknes or paralysis   Substance abuse ([place])     Family History  Problem Relation Age of Onset   Colon cancer Father    Asthma Father    Cancer Father        prostate   Hypertension Father    Asthma Mother    Hypertension Mother    Stomach cancer Neg Hx    Esophageal cancer Neg Hx     Past Surgical History:  Procedure Laterality Date   APPENDECTOMY     age 39 or 33   EXAMINATION UNDER ANESTHESIA  10/14/2011   Procedure: EXAM UNDER ANESTHESIA;  Surgeon: Earnstine Regal, MD;  [facility];  Service: General;  Laterality: N/A;  exam under anethesia, Excision of mass anal canal, 1.5cm   INGUINAL HERNIA REPAIR  05/11/2012   Procedure: HERNIA REPAIR INGUINAL ADULT;  Surgeon: Earnstine Regal, MD;  Location: [place];  Service: General;  Laterality: Left;  left inguinal hernia repair with mesh   LOWER EXTREMITY ANGIOGRAPHY Left 07/02/2021   Procedure: LOWER EXTREMITY ANGIOGRAPHY;  Surgeon: Katha Cabal, MD;  Location: [place] [facility];  Service: Cardiovascular;  Laterality: Left;   LOWER EXTREMITY ANGIOGRAPHY Right 07/16/2021   Procedure: LOWER  EXTREMITY ANGIOGRAPHY;  Surgeon: Katha Cabal, MD;  Location: [place] [facility];  Service: Cardiovascular;  Laterality: Right;   LOWER EXTREMITY ANGIOGRAPHY Left 08/27/2021   Procedure: LOWER EXTREMITY ANGIOGRAPHY;  Surgeon: Katha Cabal, MD;  Location: [place] [facility];  Service: Cardiovascular;  Laterality: Left;   LOWER EXTREMITY ANGIOGRAPHY Left 01/14/2022   Procedure: Lower Extremity Angiography;  Surgeon: Katha Cabal, MD;  Location: [place] [facility];  Service: Cardiovascular;  Laterality: Left;   SMALL INTESTINE SURGERY  04/19/2017   exploratory lap, partial small bowel resection for perforated ulcer/abcess, radiation enteritis   surgical pathology  10/14/2011   squamous cell ca of anus   TEE WITHOUT CARDIOVERSION N/A 10/16/2021   Procedure: TRANSESOPHAGEAL ECHOCARDIOGRAM (TEE);  Surgeon: [name], MD;  Location: [facility];  Service: Cardiovascular;  Laterality: N/A;   TOOTH EXTRACTION N/A 12/04/2021   Procedure: DENTAL RESTORATION/EXTRACTIONS;  Surgeon: Diona Browner, DMD;  Location: [facility];  Service: Oral Surgery;  Laterality: N/A;   transanal excision  10/14/2011   Dr.Todd Gerkin   Social History   Occupational History   Occupation: unemployed  Tobacco Use   Smoking status: Every Day    Packs/day: 1.00    Years: 47.00    Total pack years: 47.00    Types: Cigarettes   Smokeless tobacco: Never   Tobacco comments:    Cutting back>> Education officer, environmental card supplied  Scientific laboratory technician Use: Never used  Substance and Sexual Activity   Alcohol use: Not Currently    Comment: stopped years ago   Drug use: Not Currently    Types: Cocaine, Marijuana, Methamphetamines   Sexual activity: Yes    Birth control/protection: Condom

## 2022-11-04 ENCOUNTER — Ambulatory Visit: Payer: Medicare HMO | Admitting: Certified Registered"

## 2022-11-04 ENCOUNTER — Ambulatory Visit
Admission: RE | Admit: 2022-11-04 | Discharge: 2022-11-04 | Disposition: A | Discharge status: Home / Self Care | Payer: Medicare HMO | Attending: Vascular Surgery | Admitting: Vascular Surgery

## 2022-11-04 ENCOUNTER — Encounter: Payer: Self-pay | Admitting: Vascular Surgery

## 2022-11-04 ENCOUNTER — Other Ambulatory Visit: Payer: Self-pay

## 2022-11-04 ENCOUNTER — Encounter
Admission: RE | Disposition: A | Discharge status: Home / Self Care | Payer: Self-pay | Source: Home / Self Care | Attending: Vascular Surgery

## 2022-11-04 DIAGNOSIS — I70229 Atherosclerosis of native arteries of extremities with rest pain, unspecified extremity: Secondary | ICD-10-CM | POA: Diagnosis not present

## 2022-11-04 DIAGNOSIS — Z7901 Long term (current) use of anticoagulants: Secondary | ICD-10-CM | POA: Insufficient documentation

## 2022-11-04 DIAGNOSIS — I10 Essential (primary) hypertension: Secondary | ICD-10-CM | POA: Diagnosis not present

## 2022-11-04 DIAGNOSIS — Z79899 Other long term (current) drug therapy: Secondary | ICD-10-CM | POA: Insufficient documentation

## 2022-11-04 DIAGNOSIS — I739 Peripheral vascular disease, unspecified: Secondary | ICD-10-CM

## 2022-11-04 DIAGNOSIS — I70222 Atherosclerosis of native arteries of extremities with rest pain, left leg: Secondary | ICD-10-CM | POA: Diagnosis present

## 2022-11-04 DIAGNOSIS — I700 Atherosclerosis of aorta: Secondary | ICD-10-CM

## 2022-11-04 DIAGNOSIS — Z7902 Long term (current) use of antithrombotics/antiplatelets: Secondary | ICD-10-CM | POA: Diagnosis not present

## 2022-11-04 DIAGNOSIS — F17290 Nicotine dependence, other tobacco product, uncomplicated: Secondary | ICD-10-CM | POA: Diagnosis not present

## 2022-11-04 DIAGNOSIS — E785 Hyperlipidemia, unspecified: Secondary | ICD-10-CM | POA: Insufficient documentation

## 2022-11-04 DIAGNOSIS — F17210 Nicotine dependence, cigarettes, uncomplicated: Secondary | ICD-10-CM | POA: Diagnosis not present

## 2022-11-04 DIAGNOSIS — J449 Chronic obstructive pulmonary disease, unspecified: Secondary | ICD-10-CM | POA: Insufficient documentation

## 2022-11-04 DIAGNOSIS — D649 Anemia, unspecified: Secondary | ICD-10-CM | POA: Diagnosis not present

## 2022-11-04 DIAGNOSIS — T82856A Stenosis of peripheral vascular stent, initial encounter: Secondary | ICD-10-CM

## 2022-11-04 HISTORY — PX: LOWER EXTREMITY ANGIOGRAPHY: CATH118251

## 2022-11-04 LAB — URINE DRUG SCREEN, QUALITATIVE (ARMC ONLY)
Amphetamines, Ur Screen: NOT DETECTED
Barbiturates, Ur Screen: NOT DETECTED
Benzodiazepine, Ur Scrn: NOT DETECTED
Cannabinoid 50 Ng, Ur ~~LOC~~: POSITIVE — AB
Cocaine Metabolite,Ur ~~LOC~~: NOT DETECTED
MDMA (Ecstasy)Ur Screen: NOT DETECTED
Methadone Scn, Ur: POSITIVE — AB
Opiate, Ur Screen: NOT DETECTED
Phencyclidine (PCP) Ur S: NOT DETECTED
Tricyclic, Ur Screen: NOT DETECTED

## 2022-11-04 LAB — CREATININE, SERUM
Creatinine, Ser: 0.78 mg/dL (ref 0.61–1.24)
GFR, Estimated: >60 mL/min (ref >60)

## 2022-11-04 LAB — BUN: BUN: 11 mg/dL (ref 8–23)

## 2022-11-04 SURGERY — LOWER EXTREMITY ANGIOGRAPHY
Anesthesia: General | Site: Leg Lower | Laterality: Left

## 2022-11-04 MED ORDER — MIDAZOLAM HCL 2 MG/ML PO SYRP: 8.0000 mg | ORAL_SOLUTION | Freq: Once | ORAL | Status: DC | PRN

## 2022-11-04 MED ORDER — OXYCODONE HCL 5 MG/5ML PO SOLN: 5.0000 mg | Freq: Once | ORAL | Status: DC | PRN

## 2022-11-04 MED ORDER — ONDANSETRON HCL 4 MG/2ML IJ SOLN: 4.0000 mg | Freq: Four times a day (QID) | INTRAMUSCULAR | Status: DC | PRN

## 2022-11-04 MED ORDER — FENTANYL CITRATE (PF) 100 MCG/2ML IJ SOLN
INTRAMUSCULAR | Status: AC
  Filled 2022-11-04: qty 2

## 2022-11-04 MED ORDER — SODIUM CHLORIDE 0.9% FLUSH: 3.0000 mL | Freq: Two times a day (BID) | INTRAVENOUS | Status: DC

## 2022-11-04 MED ORDER — PROPOFOL 10 MG/ML IV BOLUS
INTRAVENOUS | Status: DC | PRN
  Administered 2022-11-04: 30 mg via INTRAVENOUS
  Administered 2022-11-04: 50 mg via INTRAVENOUS

## 2022-11-04 MED ORDER — SODIUM CHLORIDE 0.9% FLUSH: 3.0000 mL | INTRAVENOUS | Status: DC | PRN

## 2022-11-04 MED ORDER — PROPOFOL 500 MG/50ML IV EMUL
INTRAVENOUS | Status: DC | PRN
  Administered 2022-11-04: 125 ug/kg/min via INTRAVENOUS

## 2022-11-04 MED ORDER — DEXAMETHASONE SODIUM PHOSPHATE 10 MG/ML IJ SOLN
INTRAMUSCULAR | Status: DC | PRN
  Administered 2022-11-04: 10 mg via INTRAVENOUS

## 2022-11-04 MED ORDER — ACETAMINOPHEN 10 MG/ML IV SOLN: 1000.0000 mg | Freq: Once | INTRAVENOUS | Status: DC | PRN

## 2022-11-04 MED ORDER — FENTANYL CITRATE (PF) 100 MCG/2ML IJ SOLN: 25.0000 ug | INTRAMUSCULAR | Status: DC | PRN

## 2022-11-04 MED ORDER — SODIUM CHLORIDE 0.9 % IV SOLN: INTRAVENOUS | Status: DC

## 2022-11-04 MED ORDER — MIDAZOLAM HCL 2 MG/2ML IJ SOLN
INTRAMUSCULAR | Status: AC
  Filled 2022-11-04: qty 2

## 2022-11-04 MED ORDER — OXYCODONE HCL 5 MG PO TABS: 5.0000 mg | ORAL_TABLET | ORAL | Status: DC | PRN

## 2022-11-04 MED ORDER — ACETAMINOPHEN 325 MG PO TABS: 650.0000 mg | ORAL_TABLET | ORAL | Status: DC | PRN

## 2022-11-04 MED ORDER — CEFAZOLIN SODIUM-DEXTROSE 2-4 GM/100ML-% IV SOLN
2.0000 g | INTRAVENOUS | Status: AC
  Administered 2022-11-04: 2 g via INTRAVENOUS

## 2022-11-04 MED ORDER — DROPERIDOL 2.5 MG/ML IJ SOLN: 0.6250 mg | Freq: Once | INTRAMUSCULAR | Status: DC | PRN

## 2022-11-04 MED ORDER — PHENYLEPHRINE HCL-NACL 20-0.9 MG/250ML-% IV SOLN
INTRAVENOUS | Status: AC
  Filled 2022-11-04: qty 250

## 2022-11-04 MED ORDER — FAMOTIDINE 20 MG PO TABS: 40.0000 mg | ORAL_TABLET | Freq: Once | ORAL | Status: DC | PRN

## 2022-11-04 MED ORDER — DIPHENHYDRAMINE HCL 50 MG/ML IJ SOLN: 50.0000 mg | Freq: Once | INTRAMUSCULAR | Status: DC | PRN

## 2022-11-04 MED ORDER — DEXMEDETOMIDINE HCL IN NACL 200 MCG/50ML IV SOLN
INTRAVENOUS | Status: DC | PRN
  Administered 2022-11-04: 8 ug via INTRAVENOUS

## 2022-11-04 MED ORDER — OXYCODONE HCL 5 MG PO TABS: 5.0000 mg | ORAL_TABLET | Freq: Once | ORAL | Status: DC | PRN

## 2022-11-04 MED ORDER — HEPARIN SODIUM (PORCINE) 1000 UNIT/ML IJ SOLN
INTRAMUSCULAR | Status: DC | PRN
  Administered 2022-11-04: 6000 [IU] via INTRAVENOUS

## 2022-11-04 MED ORDER — PROPOFOL 10 MG/ML IV BOLUS
INTRAVENOUS | Status: AC
  Filled 2022-11-04: qty 20

## 2022-11-04 MED ORDER — PHENYLEPHRINE 80 MCG/ML (10ML) SYRINGE FOR IV PUSH (FOR BLOOD PRESSURE SUPPORT)
PREFILLED_SYRINGE | INTRAVENOUS | Status: DC | PRN
  Administered 2022-11-04: 160 ug via INTRAVENOUS
  Administered 2022-11-04: 80 ug via INTRAVENOUS
  Administered 2022-11-04: 160 ug via INTRAVENOUS

## 2022-11-04 MED ORDER — HYDROMORPHONE HCL 1 MG/ML IJ SOLN: 1.0000 mg | Freq: Once | INTRAMUSCULAR | Status: DC | PRN

## 2022-11-04 MED ORDER — LABETALOL HCL 5 MG/ML IV SOLN: 10.0000 mg | INTRAVENOUS | Status: DC | PRN

## 2022-11-04 MED ORDER — MORPHINE SULFATE (PF) 4 MG/ML IV SOLN: 2.0000 mg | INTRAVENOUS | Status: DC | PRN

## 2022-11-04 MED ORDER — SODIUM CHLORIDE 0.9 % IV SOLN: 250.0000 mL | INTRAVENOUS | Status: DC | PRN

## 2022-11-04 MED ORDER — METHYLPREDNISOLONE SODIUM SUCC 125 MG IJ SOLR: 125.0000 mg | Freq: Once | INTRAMUSCULAR | Status: DC | PRN

## 2022-11-04 MED ORDER — HYDRALAZINE HCL 20 MG/ML IJ SOLN: 5.0000 mg | INTRAMUSCULAR | Status: DC | PRN

## 2022-11-04 MED ORDER — CEFAZOLIN SODIUM-DEXTROSE 2-4 GM/100ML-% IV SOLN
INTRAVENOUS | Status: AC
  Filled 2022-11-04: qty 100

## 2022-11-04 MED ORDER — PROPOFOL 1000 MG/100ML IV EMUL
INTRAVENOUS | Status: AC
  Filled 2022-11-04: qty 100

## 2022-11-04 MED ORDER — FENTANYL CITRATE (PF) 100 MCG/2ML IJ SOLN
INTRAMUSCULAR | Status: DC | PRN
  Administered 2022-11-04 (×2): 50 ug via INTRAVENOUS

## 2022-11-04 MED ORDER — PROMETHAZINE HCL 25 MG/ML IJ SOLN: 6.2500 mg | INTRAMUSCULAR | Status: DC | PRN

## 2022-11-04 MED ORDER — ATORVASTATIN CALCIUM 10 MG PO TABS: 10.0000 mg | ORAL_TABLET | Freq: Every day | ORAL | 4 refills | Status: DC

## 2022-11-04 MED ORDER — PHENYLEPHRINE HCL-NACL 20-0.9 MG/250ML-% IV SOLN
INTRAVENOUS | Status: DC | PRN
  Administered 2022-11-04: 25 ug/min via INTRAVENOUS

## 2022-11-04 MED ORDER — GLYCOPYRROLATE 0.2 MG/ML IJ SOLN
INTRAMUSCULAR | Status: DC | PRN
  Administered 2022-11-04: .2 mg via INTRAVENOUS

## 2022-11-04 MED ORDER — MIDAZOLAM HCL 2 MG/2ML IJ SOLN
INTRAMUSCULAR | Status: DC | PRN
  Administered 2022-11-04: 2 mg via INTRAVENOUS

## 2022-11-04 MED ORDER — ONDANSETRON HCL 4 MG/2ML IJ SOLN
INTRAMUSCULAR | Status: DC | PRN
  Administered 2022-11-04 (×2): 4 mg via INTRAVENOUS

## 2022-11-04 SURGICAL SUPPLY — 35 items
BALLN ARMADA 2.0X80X150 (BALLOONS) ×1
BALLN LUTONIX 018 4X100X130 (BALLOONS) ×1
BALLN LUTONIX 6X220X130 (BALLOONS) ×1
BALLN LUTONIX DCB 4X40X130 (BALLOONS) ×1
BALLN LUTONIX DCB 5X40X130 (BALLOONS) ×1
BALLN ULTRASCORE 014 3X100X150 (BALLOONS) ×1
BALLN ULTRVRSE 2X100X150 (BALLOONS) ×1
BALLOON ARMADA 2.0X80X150 (BALLOONS) IMPLANT
BALLOON LUTONIX 018 4X100X130 (BALLOONS) IMPLANT
BALLOON LUTONIX 6X220X130 (BALLOONS) IMPLANT
BALLOON LUTONIX DCB 4X40X130 (BALLOONS) IMPLANT
BALLOON LUTONIX DCB 5X40X130 (BALLOONS) IMPLANT
BALLOON ULTRSCRE 014 3X100X150 (BALLOONS) IMPLANT
BALLOON ULTRVRSE 2X100X150 (BALLOONS) IMPLANT
CATH ANGIO 5F PIGTAIL 65CM (CATHETERS) IMPLANT
CATH BEACON 5 .035 65 KMP TIP (CATHETERS) IMPLANT
CATH VERT 5FR 125CM (CATHETERS) IMPLANT
COVER PROBE ULTRASOUND 5X96 (MISCELLANEOUS) IMPLANT
DEVICE STARCLOSE SE CLOSURE (Vascular Products) IMPLANT
GLIDEWIRE ADV .014X300CM (WIRE) IMPLANT
GLIDEWIRE ADV .035X260CM (WIRE) IMPLANT
GLIDEWIRE ADV TRACK .018X300 (WIRE) IMPLANT
GOWN STRL REUS W/ TWL LRG LVL3 (GOWN DISPOSABLE) ×1 IMPLANT
GOWN STRL REUS W/TWL LRG LVL3 (GOWN DISPOSABLE) ×1
KIT ENCORE 26 ADVANTAGE (KITS) IMPLANT
NDL ENTRY 21GA 7CM ECHOTIP (NEEDLE) IMPLANT
NEEDLE ENTRY 21GA 7CM ECHOTIP (NEEDLE) ×1 IMPLANT
PACK ANGIOGRAPHY (CUSTOM PROCEDURE TRAY) ×1 IMPLANT
SET INTRO CAPELLA COAXIAL (SET/KITS/TRAYS/PACK) IMPLANT
SHEATH ANL2 6FRX45 HC (SHEATH) IMPLANT
SHEATH BRITE TIP 5FRX11 (SHEATH) IMPLANT
STENT LIFESTENT 5F 6X40X135 (Permanent Stent) IMPLANT
SYR MEDRAD MARK 7 150ML (SYRINGE) IMPLANT
TUBING CONTRAST HIGH PRESS 72 (TUBING) IMPLANT
WIRE GUIDERIGHT .035X150 (WIRE) IMPLANT

## 2022-11-04 NOTE — Anesthesia Postprocedure Evaluation (Signed)
Anesthesia Post Note  Patient: Martin [name]  Procedure(s) Performed: Lower Extremity Angiography (Left: Leg Lower)  Patient location during evaluation: PACU Anesthesia Type: General Level of consciousness: awake and alert Pain management: pain level controlled Vital Signs Assessment: post-procedure vital signs reviewed and stable Respiratory status: spontaneous breathing, nonlabored ventilation and respiratory function stable Cardiovascular status: blood pressure returned to baseline and stable Postop Assessment: no apparent nausea or vomiting Anesthetic complications: no   There were no known notable events for this encounter.   Last Vitals:  Vitals:   11/04/22 1530 11/04/22 1545  BP: (!) 133/42 (!) 138/93  Pulse: (!) 51 64  Resp: 14 18  Temp:    SpO2: 97% 99%    Last Pain:  Vitals:   11/04/22 1545  TempSrc:   PainSc: 0-No pain                 Iran Ouch

## 2022-11-04 NOTE — Anesthesia Preprocedure Evaluation (Addendum)
Anesthesia Evaluation  Patient identified by MRN, date of birth, ID band Patient awake    Reviewed: Allergy & Precautions, NPO status , Patient's Chart, lab work & pertinent test results  History of Anesthesia Complications Negative for: history of anesthetic complications  Airway Mallampati: III   Neck ROM: Full    Dental  (+) Edentulous Upper, Edentulous Lower   Pulmonary COPD, Current SmokerPatient did not abstain from smoking.    + wheezing      Cardiovascular Exercise Tolerance: Good hypertension, + Peripheral Vascular Disease (on Plavix and Eliquis. ASO w rest pain)   Rhythm:Regular Rate:Normal + Diastolic murmurs  ECHO 99991111: 1. Left ventricular ejection fraction, by estimation, is 55 to 60%. The  left ventricle has normal function. The left ventricle has no regional  wall motion abnormalities. There is mild left ventricular hypertrophy.   2. Right ventricular systolic function is normal. The right ventricular  size is normal.   3. Left atrial size was mildly dilated. No left atrial/left atrial  appendage thrombus was detected. The LAA emptying velocity was 62 cm/s.   4. The mitral valve is degenerative. Mild mitral valve regurgitation.   5. No vegetation. The aortic valve is tricuspid. Aortic valve  regurgitation moderate central. No aortic stenosis is present.     Neuro/Psych  PSYCHIATRIC DISORDERS Anxiety Depression    Hx polysubstance use disorder; last cocaine use few months ago CVA, No Residual Symptoms    GI/Hepatic ,,,(+)     substance abuse  cocaine use  Endo/Other  negative endocrine ROS    Renal/GU Renal disease     Musculoskeletal  (+) Arthritis ,  narcotic dependent  Abdominal   Peds  Hematology  (+) Blood dyscrasia, anemia   Anesthesia Other Findings Cocaine negative on UDS today.  Reproductive/Obstetrics                             Anesthesia  Physical Anesthesia Plan  ASA: 3  Anesthesia Plan: General   Post-op Pain Management: Minimal or no pain anticipated   Induction: Intravenous  PONV Risk Score and Plan: 1 and Propofol infusion, TIVA, Treatment may vary due to age or medical condition and Ondansetron  Airway Management Planned: Natural Airway  Additional Equipment:   Intra-op Plan:   Post-operative Plan:   Informed Consent: I have reviewed the patients History and Physical, chart, labs and discussed the procedure including the risks, benefits and alternatives for the proposed anesthesia with the patient or authorized representative who has indicated his/her understanding and acceptance.     Dental advisory given  Plan Discussed with: CRNA  Anesthesia Plan Comments: (LMA/GETA backup discussed.  Patient consented for risks of anesthesia including but not limited to:  - adverse reactions to medications - damage to eyes, teeth, lips or other oral mucosa - nerve damage due to positioning  - sore throat or hoarseness - damage to heart, brain, nerves, lungs, other parts of body or loss of life  Informed patient about role of CRNA in peri- and intra-operative care.  Patient voiced understanding.)        Anesthesia Quick Evaluation

## 2022-11-04 NOTE — H&P (View-Only) (Signed)
MRN : [id number]  Martin [name] is a 62 y.o. (01-20-1961) male who presents with chief complaint of check circulation.  History of Present Illness:   The patient returns to the office for followup and review of the noninvasive studies.   The patient notes that there has been a significant deterioration in the lower extremity symptoms.  The patient notes interval shortening of his left leg claudication distance and development of left rest pain symptoms. No new ulcers or wounds have occurred since the last visit.  There have been no significant changes to the patient's overall health care.  The patient denies amaurosis fugax or recent TIA symptoms. There are no recent neurological changes noted. There is no history of DVT, PE or superficial thrombophlebitis. The patient denies recent episodes of angina or shortness of breath.   ABI's Rt=1.15 and Lt=1.15 (previous ABI's Rt=1.04 and Lt=1.10) Duplex US of the lower extremity arterial system shows biphasic tibial artery waveforms bilaterally, however arterial duplex shows hemodynamically significant stenosis in the left profunda artery.  This is significant as the previous angiogram did show that he had diffuse disease throughout.  In reviewing the angiogram report it also notes that the patient does not have a left hypogastric artery which may be exacerbating his symptoms in his hip.  Current Meds  Medication Sig   albuterol (VENTOLIN HFA) 108 (90 Base) MCG/ACT inhaler Inhale 2 puffs into the lungs every 6 (six) hours as needed for wheezing or shortness of breath.   EPINEPHrine (EPIPEN 2-PAK) 0.3 mg/0.3 mL IJ SOAJ injection Inject 0.3 mg (1 pen) into the muscle as needed for anaphylaxis.   feeding supplement (ENSURE ENLIVE / ENSURE PLUS) LIQD Take 237 mLs by mouth 3 (three) times daily between meals.   ipratropium-albuterol (DUONEB) 0.5-2.5 (3) MG/3ML SOLN Take 3 mLs by nebulization every 6 (six) hours as needed.    METHADONE HCL PO Take 85 mg by mouth daily.   tamsulosin (FLOMAX) 0.4 MG CAPS capsule Take 1 capsule (0.4 mg total) by mouth at bedtime.    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   Peripheral vascular disease ([place])    Pneumonia    as child, cough at present with no fever   Pneumonia    as child, cough at present with no fever    Radiation 11/19/11-01/08/12   5040 cGy 28 fx Pelvis and inguinal area   Stroke ([place])    no weaknes or paralysis   Substance abuse [facility])     Past Surgical History:  Procedure Laterality Date   APPENDECTOMY     age 20 or 81   EXAMINATION UNDER ANESTHESIA  10/14/2011   Procedure: EXAM UNDER ANESTHESIA;  Surgeon: Earnstine Regal, MD;  Location: [facility];  Service: General;  Laterality: N/A;  exam under anethesia, Excision of mass anal canal, 1.5cm   INGUINAL HERNIA REPAIR  05/11/2012   Procedure: HERNIA REPAIR INGUINAL ADULT;  Surgeon: Earnstine Regal, MD;  Location: [place];  Service: General;  Laterality: Left;  left inguinal hernia repair with mesh   LOWER EXTREMITY ANGIOGRAPHY Left 07/02/2021   Procedure: LOWER EXTREMITY ANGIOGRAPHY;  Surgeon: Katha Cabal, MD;  Location: [place] [facility];  Service: Cardiovascular;  Laterality: Left;   LOWER EXTREMITY ANGIOGRAPHY Right 07/16/2021   Procedure: LOWER EXTREMITY ANGIOGRAPHY;  Surgeon: Katha Cabal, MD;  Location: [place] [facility];  Service: Cardiovascular;  Laterality: Right;   LOWER EXTREMITY ANGIOGRAPHY Left 08/27/2021   Procedure: LOWER EXTREMITY ANGIOGRAPHY;  Surgeon: Katha Cabal, MD;  Location: [place] [facility];  Service: Cardiovascular;  Laterality: Left;   LOWER EXTREMITY ANGIOGRAPHY Left 01/14/2022   Procedure: Lower Extremity Angiography;  Surgeon: Katha Cabal, MD;  Location: [place] [facility];  Service: Cardiovascular;  Laterality: Left;   SMALL INTESTINE SURGERY  04/19/2017   exploratory lap, partial small bowel resection for perforated ulcer/abcess, radiation enteritis   surgical pathology   10/14/2011   squamous cell ca of anus   TEE WITHOUT CARDIOVERSION N/A 10/16/2021   Procedure: TRANSESOPHAGEAL ECHOCARDIOGRAM (TEE);  Surgeon: [name], MD;  Location: [facility];  Service: Cardiovascular;  Laterality: N/A;   TOOTH EXTRACTION N/A 12/04/2021   Procedure: DENTAL RESTORATION/EXTRACTIONS;  Surgeon: Diona Browner, DMD;  Location: [place];  Service: Oral Surgery;  Laterality: N/A;   transanal excision  10/14/2011   Dr.Todd Gerkin    Social History Social History   Tobacco Use   Smoking status: Every Day    Packs/day: 1.00    Years: 47.00    Total pack years: 47.00    Types: Cigarettes   Smokeless tobacco: Never   Tobacco comments:    Cutting back>> Quit Smart card supplied  Scientific laboratory technician Use: Never used  Substance Use Topics   Alcohol use: Not Currently    Comment: stopped years ago   Drug use: Not Currently    Types: Cocaine, Marijuana, Methamphetamines    Family History Family History  Problem Relation Age of Onset   Colon cancer Father    Asthma Father    Cancer Father        prostate   Hypertension Father    Asthma Mother    Hypertension Mother    Stomach cancer Neg Hx    Esophageal cancer Neg Hx     Allergies  Allergen Reactions   Aspirin Swelling    Lip swelling   Yellow Jacket Venom [Bee  Venom] Anaphylaxis   Codeine Nausea Only   Ibuprofen     On blood thinners   Morphine And Related Itching     REVIEW OF SYSTEMS (Negative unless checked)  Constitutional: '[]'$ Weight loss  '[]'$ Fever  '[]'$ Chills Cardiac: '[]'$ Chest pain   '[]'$ Chest pressure   '[]'$ Palpitations   '[]'$ Shortness of breath when laying flat   '[]'$ Shortness of breath with exertion. Vascular:  '[x]'$ Pain in legs with walking   '[]'$ Pain in legs at rest  '[]'$ History of DVT   '[]'$ Phlebitis   '[]'$ Swelling in legs   '[]'$ Varicose veins   '[]'$ Non-healing ulcers Pulmonary:   '[]'$ Uses home oxygen   '[]'$ Productive cough   '[]'$ Hemoptysis   '[]'$ Wheeze  '[]'$ COPD   '[]'$ Asthma Neurologic:  '[]'$ Dizziness   '[]'$ Seizures   '[]'$   History of stroke   '[]'$ History of TIA  '[]'$ Aphasia   '[]'$ Vissual changes   '[]'$ Weakness or numbness in arm   '[]'$ Weakness or numbness in leg Musculoskeletal:   '[]'$ Joint swelling   '[]'$ Joint pain   '[]'$ Low back pain Hematologic:  '[]'$ Easy bruising  '[]'$ Easy bleeding   '[]'$ Hypercoagulable state   '[]'$ Anemic Gastrointestinal:  '[]'$ Diarrhea   '[]'$ Vomiting  '[]'$ Gastroesophageal reflux/heartburn   '[]'$ Difficulty swallowing. Genitourinary:  '[]'$ Chronic kidney disease   '[]'$ Difficult urination  '[]'$ Frequent urination   '[]'$ Blood in urine Skin:  '[]'$ Rashes   '[]'$ Ulcers  Psychological:  '[]'$ History of anxiety   '[]'$  History of major depression.  Physical Examination  Vitals:   11/04/22 1122  BP: (!) 146/42  Pulse: 64  Resp: 12  Temp: 98.3 F (36.8 C)  TempSrc: Oral  SpO2: 99%  Weight: 68 kg  Height: 5' 11.02" (1.804 m)   Body mass index is 20.91 kg/m. Gen: WD/WN, NAD Head: [place]/AT, No temporalis wasting.  Ear/Nose/Throat: Hearing grossly intact, nares w/o erythema or drainage Eyes: PER, EOMI, sclera nonicteric.  Neck: Supple, no masses.  No bruit or JVD.  Pulmonary:  Good air movement, no audible wheezing, no use of accessory muscles.  Cardiac: RRR, normal S1, S2, no Murmurs. Vascular:  mild trophic changes, no open wounds Vessel Right Left  Radial Palpable Palpable  PT Not Palpable Not Palpable   DP Not Palpable Not Palpable  Gastrointestinal: soft, non-distended. No guarding/no peritoneal signs.  Musculoskeletal: M/S 5/5 throughout.  No visible deformity.  Neurologic: CN 2-12 intact. Pain and light touch intact in extremities.  Symmetrical.  Speech is fluent. Motor exam as listed above. Psychiatric: Judgment intact, Mood & affect appropriate for pt's clinical situation. Dermatologic: No rashes or ulcers noted.  No changes consistent with cellulitis.   CBC Lab Results  Component Value Date   WBC 8.9 04/30/2022   HGB 9.7 (L) 04/30/2022   HCT 30.8 (L) 04/30/2022   MCV 90.9 04/30/2022   PLT 153 04/30/2022    BMET    Component Value Date/Time   NA 140 04/30/2022 0515   NA 137 03/02/2022 1136   NA 141 10/19/2014 1204   K 4.9 04/30/2022 0515   K 4.6 10/19/2014 1204   CL 110 04/30/2022 0515   CO2 25 04/30/2022 0515   CO2 26 10/19/2014 1204   GLUCOSE 100 (H) 04/30/2022 0515   GLUCOSE 91 10/19/2014 1204   BUN 11 11/04/2022 1113   BUN 11 03/02/2022 1136   BUN 10.8 10/19/2014 1204   CREATININE 0.78 11/04/2022 1113   CREATININE 0.83 07/05/2015 1042   CREATININE 0.9 10/19/2014 1204   CALCIUM 9.1 04/30/2022 0515   CALCIUM 9.6 10/19/2014 1204   GFRNONAA >60 11/04/2022 1113   GFRNONAA >89 07/05/2015 1042   GFRAA >60 03/10/2020 1219   GFRAA >89 07/05/2015 1042   Estimated Creatinine Clearance: 90.9 mL/min (by C-G formula based on SCr of 0.78 mg/dL).  COAG Lab Results  Component Value Date   INR 1.1 04/19/2022    Radiology NCV with EMG (electromyography)  Result Date: 10/15/2022 Magnus Sinning, MD     10/21/2022 10:16 AM EMG & NCV Findings: Evaluation of the right median motor nerve showed decreased conduction velocity (Elbow-Wrist, 49 m/s).  The right median (across palm) sensory nerve showed prolonged distal peak latency (Wrist, 4.2 ms) and prolonged distal peak latency (Palm, 2.2 ms).  All remaining nerves (as indicated in the following tables) were within normal  limits.  Impression: The above electrodiagnostic study is ABNORMAL and reveals evidence of a  mild to moderate right median nerve entrapment at the wrist (carpal tunnel syndrome) affecting sensory and motor components. There is no significant electrodiagnostic evidence of any other focal nerve entrapment, brachial plexopathy or cervical radiculopathy. Recommendations: 1.  Follow-up with referring physician. 2.  Continue current management of symptoms. 3.  Continue use of resting splint at night-time and as needed during the day. 4.  Suggest surgical evaluation. ___________________________ Laurence Spates FAAPMR Board Certified, American Board of Physical Medicine and Rehabilitation Nerve Conduction Studies Anti Sensory Summary Table  Stim Site NR Peak (ms) Norm Peak (ms) P-T Amp (V) Norm P-T Amp Site1 Site2 Delta-P (ms) Dist (cm) Vel (m/s) Norm Vel (m/s) Right Median Acr Palm Anti Sensory (2nd Digit)  30C Wrist    *4.2 <3.6 21.0 >10 Wrist Palm 2.0 0.0   Palm    *2.2 <2.0 13.6        Right Radial Anti Sensory (Base 1st Digit)  30.8C Wrist    2.4 <3.1 23.5  Wrist Base 1st Digit 2.4 0.0   Right Ulnar Anti Sensory (5th Digit)  30.8C Wrist    3.6 <3.7 18.3 >15.0 Wrist 5th Digit 3.6 14.0 39 >38 Motor Summary Table  Stim Site NR Onset (ms) Norm Onset (ms) O-P Amp (mV) Norm O-P Amp Site1 Site2 Delta-0 (ms) Dist (cm) Vel (m/s) Norm Vel (m/s) Right Median Motor (Abd Poll Brev)  31.1C Wrist    4.1 <4.2 5.6 >5 Elbow Wrist 4.7 23.0 *49 >50 Elbow    8.8  5.4        Right Ulnar Motor (Abd Dig Min)  31.5C Wrist    3.0 <4.2 7.0 >3 B Elbow Wrist 4.0 22.0 55 >53 B Elbow    7.0  7.5  A Elbow B Elbow 1.7 10.0 59 >53 A Elbow    8.7  7.5        Nerve Conduction Studies Anti Sensory Left/Right Comparison  Stim Site L Lat (ms) R Lat (ms) L-R Lat (ms) L Amp (V) R Amp (V) L-R Amp (%) Site1 Site2 L Vel (m/s) R Vel (m/s) L-R Vel (m/s) Median Acr Palm Anti Sensory (2nd Digit)  30C Wrist  *4.2   21.0  Wrist Palm    Palm  *2.2   13.6        Radial Anti Sensory (Base 1st Digit)  30.8C Wrist  2.4   23.5  Wrist Base 1st Digit    Ulnar Anti Sensory (5th Digit)  30.8C Wrist  3.6   18.3  Wrist 5th Digit  39  Motor Left/Right Comparison  Stim Site L Lat (ms) R Lat (ms) L-R Lat (ms) L Amp (mV) R Amp (mV) L-R Amp (%) Site1 Site2 L Vel (m/s) R Vel (m/s) L-R Vel (m/s) Median Motor (Abd Poll Brev)  31.1C Wrist  4.1   5.6  Elbow Wrist  *49  Elbow  8.8   5.4       Ulnar Motor (Abd Dig Min)  31.5C Wrist  3.0   7.0  B Elbow Wrist  55  B Elbow  7.0   7.5  A Elbow B Elbow  59  A Elbow  8.7   7.5       Waveforms:          Assessment/Plan 1. 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  Given the severity of the patient's severe left lower extremity symptoms the patient should undergo angiography with the hope for intervention.  Risk and benefits were reviewed the patient.  Indications for the procedure were reviewed.  All questions were answered, the patient agrees to proceed with left lower extremity angiography and possible intervention.    The patient should continue walking and begin a more formal exercise program.  The patient should continue antiplatelet therapy and aggressive treatment of the lipid abnormalities   The patient will follow up with me after the angiogram.    2. Primary hypertension Continue antihypertensive medications as already ordered, these medications have been reviewed and there are no changes at this time.   3. Hyperlipidemia, unspecified hyperlipidemia type Continue statin as ordered and reviewed, no changes at this time   [name], MD  11/04/2022 12:22 PM

## 2022-11-04 NOTE — Anesthesia Procedure Notes (Addendum)
Procedure Name: General with mask airway Date/Time: 11/04/2022 1:06 PM  Performed by: Kelton Pillar, CRNAPre-anesthesia Checklist: Patient identified, Emergency Drugs available, Suction available and Patient being monitored Patient Re-evaluated:Patient Re-evaluated prior to induction Oxygen Delivery Method: Simple face mask Induction Type: IV induction Ventilation: Oral airway inserted - appropriate to patient size Placement Confirmation: positive ETCO2, CO2 detector and breath sounds checked- equal and bilateral Dental Injury: Teeth and Oropharynx as per pre-operative assessment

## 2022-11-04 NOTE — Interval H&P Note (Signed)
History and Physical Interval Note:  11/04/2022 12:32 PM  Martin [name]  has presented today for surgery, with the diagnosis of LLE Angio   ANESTHESIA   ASO w rest pain.  The various methods of treatment have been discussed with the patient and family. After consideration of risks, benefits and other options for treatment, the patient has consented to  Procedure(s): Lower Extremity Angiography (Left) as a surgical intervention.  The patient's history has been reviewed, patient examined, no change in status, stable for surgery.  I have reviewed the patient's chart and labs.  Questions were answered to the patient's satisfaction.     [name]

## 2022-11-04 NOTE — Progress Notes (Signed)
MRN : [id number]  Martin [name] is a 62 y.o. (Jan 10, 1961) male who presents with chief complaint of check circulation.  History of Present Illness:   The patient returns to the office for followup and review of the noninvasive studies.   The patient notes that there has been a significant deterioration in the lower extremity symptoms.  The patient notes interval shortening of his left leg claudication distance and development of left rest pain symptoms. No new ulcers or wounds have occurred since the last visit.  There have been no significant changes to the patient's overall health care.  The patient denies amaurosis fugax or recent TIA symptoms. There are no recent neurological changes noted. There is no history of DVT, PE or superficial thrombophlebitis. The patient denies recent episodes of angina or shortness of breath.   ABI's Rt=1.15 and Lt=1.15 (previous ABI's Rt=1.04 and Lt=1.10) Duplex US of the lower extremity arterial system shows biphasic tibial artery waveforms bilaterally, however arterial duplex shows hemodynamically significant stenosis in the left profunda artery.  This is significant as the previous angiogram did show that he had diffuse disease throughout.  In reviewing the angiogram report it also notes that the patient does not have a left hypogastric artery which may be exacerbating his symptoms in his hip.  Current Meds  Medication Sig   albuterol (VENTOLIN HFA) 108 (90 Base) MCG/ACT inhaler Inhale 2 puffs into the lungs every 6 (six) hours as needed for wheezing or shortness of breath.   EPINEPHrine (EPIPEN 2-PAK) 0.3 mg/0.3 mL IJ SOAJ injection Inject 0.3 mg (1 pen) into the muscle as needed for anaphylaxis.   feeding supplement (ENSURE ENLIVE / ENSURE PLUS) LIQD Take 237 mLs by mouth 3 (three) times daily between meals.   ipratropium-albuterol (DUONEB) 0.5-2.5 (3) MG/3ML SOLN Take 3 mLs by nebulization every 6 (six) hours as needed.    METHADONE HCL PO Take 85 mg by mouth daily.   tamsulosin (FLOMAX) 0.4 MG CAPS capsule Take 1 capsule (0.4 mg total) by mouth at bedtime.    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   Peripheral vascular disease ([place])    Pneumonia    as child, cough at present with no fever   Pneumonia    as child, cough at present with no fever    Radiation 11/19/11-01/08/12   5040 cGy 28 fx Pelvis and inguinal area   Stroke ([place])    no weaknes or paralysis   Substance abuse [facility])     Past Surgical History:  Procedure Laterality Date   APPENDECTOMY     age 56 or 5   EXAMINATION UNDER ANESTHESIA  10/14/2011   Procedure: EXAM UNDER ANESTHESIA;  Surgeon: Earnstine Regal, MD;  Location: [facility];  Service: General;  Laterality: N/A;  exam under anethesia, Excision of mass anal canal, 1.5cm   INGUINAL HERNIA REPAIR  05/11/2012   Procedure: HERNIA REPAIR INGUINAL ADULT;  Surgeon: Earnstine Regal, MD;  Location: [place];  Service: General;  Laterality: Left;  left inguinal hernia repair with mesh   LOWER EXTREMITY ANGIOGRAPHY Left 07/02/2021   Procedure: LOWER EXTREMITY ANGIOGRAPHY;  Surgeon: Katha Cabal, MD;  Location: [place] [facility];  Service: Cardiovascular;  Laterality: Left;   LOWER EXTREMITY ANGIOGRAPHY Right 07/16/2021   Procedure: LOWER EXTREMITY ANGIOGRAPHY;  Surgeon: Katha Cabal, MD;  Location: [place] [facility];  Service: Cardiovascular;  Laterality: Right;   LOWER EXTREMITY ANGIOGRAPHY Left 08/27/2021   Procedure: LOWER EXTREMITY ANGIOGRAPHY;  Surgeon: Katha Cabal, MD;  Location: [place] [facility];  Service: Cardiovascular;  Laterality: Left;   LOWER EXTREMITY ANGIOGRAPHY Left 01/14/2022   Procedure: Lower Extremity Angiography;  Surgeon: Katha Cabal, MD;  Location: [place] [facility];  Service: Cardiovascular;  Laterality: Left;   SMALL INTESTINE SURGERY  04/19/2017   exploratory lap, partial small bowel resection for perforated ulcer/abcess, radiation enteritis   surgical pathology   10/14/2011   squamous cell ca of anus   TEE WITHOUT CARDIOVERSION N/A 10/16/2021   Procedure: TRANSESOPHAGEAL ECHOCARDIOGRAM (TEE);  Surgeon: [name], MD;  Location: [facility];  Service: Cardiovascular;  Laterality: N/A;   TOOTH EXTRACTION N/A 12/04/2021   Procedure: DENTAL RESTORATION/EXTRACTIONS;  Surgeon: Diona Browner, DMD;  Location: [place];  Service: Oral Surgery;  Laterality: N/A;   transanal excision  10/14/2011   Dr.Todd Gerkin    Social History Social History   Tobacco Use   Smoking status: Every Day    Packs/day: 1.00    Years: 47.00    Total pack years: 47.00    Types: Cigarettes   Smokeless tobacco: Never   Tobacco comments:    Cutting back>> Quit Smart card supplied  Scientific laboratory technician Use: Never used  Substance Use Topics   Alcohol use: Not Currently    Comment: stopped years ago   Drug use: Not Currently    Types: Cocaine, Marijuana, Methamphetamines    Family History Family History  Problem Relation Age of Onset   Colon cancer Father    Asthma Father    Cancer Father        prostate   Hypertension Father    Asthma Mother    Hypertension Mother    Stomach cancer Neg Hx    Esophageal cancer Neg Hx     Allergies  Allergen Reactions   Aspirin Swelling    Lip swelling   Yellow Jacket Venom [Bee  Venom] Anaphylaxis   Codeine Nausea Only   Ibuprofen     On blood thinners   Morphine And Related Itching     REVIEW OF SYSTEMS (Negative unless checked)  Constitutional: '[]'$ Weight loss  '[]'$ Fever  '[]'$ Chills Cardiac: '[]'$ Chest pain   '[]'$ Chest pressure   '[]'$ Palpitations   '[]'$ Shortness of breath when laying flat   '[]'$ Shortness of breath with exertion. Vascular:  '[x]'$ Pain in legs with walking   '[]'$ Pain in legs at rest  '[]'$ History of DVT   '[]'$ Phlebitis   '[]'$ Swelling in legs   '[]'$ Varicose veins   '[]'$ Non-healing ulcers Pulmonary:   '[]'$ Uses home oxygen   '[]'$ Productive cough   '[]'$ Hemoptysis   '[]'$ Wheeze  '[]'$ COPD   '[]'$ Asthma Neurologic:  '[]'$ Dizziness   '[]'$ Seizures   '[]'$   History of stroke   '[]'$ History of TIA  '[]'$ Aphasia   '[]'$ Vissual changes   '[]'$ Weakness or numbness in arm   '[]'$ Weakness or numbness in leg Musculoskeletal:   '[]'$ Joint swelling   '[]'$ Joint pain   '[]'$ Low back pain Hematologic:  '[]'$ Easy bruising  '[]'$ Easy bleeding   '[]'$ Hypercoagulable state   '[]'$ Anemic Gastrointestinal:  '[]'$ Diarrhea   '[]'$ Vomiting  '[]'$ Gastroesophageal reflux/heartburn   '[]'$ Difficulty swallowing. Genitourinary:  '[]'$ Chronic kidney disease   '[]'$ Difficult urination  '[]'$ Frequent urination   '[]'$ Blood in urine Skin:  '[]'$ Rashes   '[]'$ Ulcers  Psychological:  '[]'$ History of anxiety   '[]'$  History of major depression.  Physical Examination  Vitals:   11/04/22 1122  BP: (!) 146/42  Pulse: 64  Resp: 12  Temp: 98.3 F (36.8 C)  TempSrc: Oral  SpO2: 99%  Weight: 68 kg  Height: 5' 11.02" (1.804 m)   Body mass index is 20.91 kg/m. Gen: WD/WN, NAD Head: [place]/AT, No temporalis wasting.  Ear/Nose/Throat: Hearing grossly intact, nares w/o erythema or drainage Eyes: PER, EOMI, sclera nonicteric.  Neck: Supple, no masses.  No bruit or JVD.  Pulmonary:  Good air movement, no audible wheezing, no use of accessory muscles.  Cardiac: RRR, normal S1, S2, no Murmurs. Vascular:  mild trophic changes, no open wounds Vessel Right Left  Radial Palpable Palpable  PT Not Palpable Not Palpable   DP Not Palpable Not Palpable  Gastrointestinal: soft, non-distended. No guarding/no peritoneal signs.  Musculoskeletal: M/S 5/5 throughout.  No visible deformity.  Neurologic: CN 2-12 intact. Pain and light touch intact in extremities.  Symmetrical.  Speech is fluent. Motor exam as listed above. Psychiatric: Judgment intact, Mood & affect appropriate for pt's clinical situation. Dermatologic: No rashes or ulcers noted.  No changes consistent with cellulitis.   CBC Lab Results  Component Value Date   WBC 8.9 04/30/2022   HGB 9.7 (L) 04/30/2022   HCT 30.8 (L) 04/30/2022   MCV 90.9 04/30/2022   PLT 153 04/30/2022    BMET    Component Value Date/Time   NA 140 04/30/2022 0515   NA 137 03/02/2022 1136   NA 141 10/19/2014 1204   K 4.9 04/30/2022 0515   K 4.6 10/19/2014 1204   CL 110 04/30/2022 0515   CO2 25 04/30/2022 0515   CO2 26 10/19/2014 1204   GLUCOSE 100 (H) 04/30/2022 0515   GLUCOSE 91 10/19/2014 1204   BUN 11 11/04/2022 1113   BUN 11 03/02/2022 1136   BUN 10.8 10/19/2014 1204   CREATININE 0.78 11/04/2022 1113   CREATININE 0.83 07/05/2015 1042   CREATININE 0.9 10/19/2014 1204   CALCIUM 9.1 04/30/2022 0515   CALCIUM 9.6 10/19/2014 1204   GFRNONAA >60 11/04/2022 1113   GFRNONAA >89 07/05/2015 1042   GFRAA >60 03/10/2020 1219   GFRAA >89 07/05/2015 1042   Estimated Creatinine Clearance: 90.9 mL/min (by C-G formula based on SCr of 0.78 mg/dL).  COAG Lab Results  Component Value Date   INR 1.1 04/19/2022    Radiology NCV with EMG (electromyography)  Result Date: 10/15/2022 Magnus Sinning, MD     10/21/2022 10:16 AM EMG & NCV Findings: Evaluation of the right median motor nerve showed decreased conduction velocity (Elbow-Wrist, 49 m/s).  The right median (across palm) sensory nerve showed prolonged distal peak latency (Wrist, 4.2 ms) and prolonged distal peak latency (Palm, 2.2 ms).  All remaining nerves (as indicated in the following tables) were within normal  limits.  Impression: The above electrodiagnostic study is ABNORMAL and reveals evidence of a  mild to moderate right median nerve entrapment at the wrist (carpal tunnel syndrome) affecting sensory and motor components. There is no significant electrodiagnostic evidence of any other focal nerve entrapment, brachial plexopathy or cervical radiculopathy. Recommendations: 1.  Follow-up with referring physician. 2.  Continue current management of symptoms. 3.  Continue use of resting splint at night-time and as needed during the day. 4.  Suggest surgical evaluation. ___________________________ Laurence Spates FAAPMR Board Certified, American Board of Physical Medicine and Rehabilitation Nerve Conduction Studies Anti Sensory Summary Table  Stim Site NR Peak (ms) Norm Peak (ms) P-T Amp (V) Norm P-T Amp Site1 Site2 Delta-P (ms) Dist (cm) Vel (m/s) Norm Vel (m/s) Right Median Acr Palm Anti Sensory (2nd Digit)  30C Wrist    *4.2 <3.6 21.0 >10 Wrist Palm 2.0 0.0   Palm    *2.2 <2.0 13.6        Right Radial Anti Sensory (Base 1st Digit)  30.8C Wrist    2.4 <3.1 23.5  Wrist Base 1st Digit 2.4 0.0   Right Ulnar Anti Sensory (5th Digit)  30.8C Wrist    3.6 <3.7 18.3 >15.0 Wrist 5th Digit 3.6 14.0 39 >38 Motor Summary Table  Stim Site NR Onset (ms) Norm Onset (ms) O-P Amp (mV) Norm O-P Amp Site1 Site2 Delta-0 (ms) Dist (cm) Vel (m/s) Norm Vel (m/s) Right Median Motor (Abd Poll Brev)  31.1C Wrist    4.1 <4.2 5.6 >5 Elbow Wrist 4.7 23.0 *49 >50 Elbow    8.8  5.4        Right Ulnar Motor (Abd Dig Min)  31.5C Wrist    3.0 <4.2 7.0 >3 B Elbow Wrist 4.0 22.0 55 >53 B Elbow    7.0  7.5  A Elbow B Elbow 1.7 10.0 59 >53 A Elbow    8.7  7.5        Nerve Conduction Studies Anti Sensory Left/Right Comparison  Stim Site L Lat (ms) R Lat (ms) L-R Lat (ms) L Amp (V) R Amp (V) L-R Amp (%) Site1 Site2 L Vel (m/s) R Vel (m/s) L-R Vel (m/s) Median Acr Palm Anti Sensory (2nd Digit)  30C Wrist  *4.2   21.0  Wrist Palm    Palm  *2.2   13.6        Radial Anti Sensory (Base 1st Digit)  30.8C Wrist  2.4   23.5  Wrist Base 1st Digit    Ulnar Anti Sensory (5th Digit)  30.8C Wrist  3.6   18.3  Wrist 5th Digit  39  Motor Left/Right Comparison  Stim Site L Lat (ms) R Lat (ms) L-R Lat (ms) L Amp (mV) R Amp (mV) L-R Amp (%) Site1 Site2 L Vel (m/s) R Vel (m/s) L-R Vel (m/s) Median Motor (Abd Poll Brev)  31.1C Wrist  4.1   5.6  Elbow Wrist  *49  Elbow  8.8   5.4       Ulnar Motor (Abd Dig Min)  31.5C Wrist  3.0   7.0  B Elbow Wrist  55  B Elbow  7.0   7.5  A Elbow B Elbow  59  A Elbow  8.7   7.5       Waveforms:          Assessment/Plan 1. 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  Given the severity of the patient's severe left lower extremity symptoms the patient should undergo angiography with the hope for intervention.  Risk and benefits were reviewed the patient.  Indications for the procedure were reviewed.  All questions were answered, the patient agrees to proceed with left lower extremity angiography and possible intervention.    The patient should continue walking and begin a more formal exercise program.  The patient should continue antiplatelet therapy and aggressive treatment of the lipid abnormalities   The patient will follow up with me after the angiogram.    2. Primary hypertension Continue antihypertensive medications as already ordered, these medications have been reviewed and there are no changes at this time.   3. Hyperlipidemia, unspecified hyperlipidemia type Continue statin as ordered and reviewed, no changes at this time   [name], MD  11/04/2022 12:22 PM

## 2022-11-04 NOTE — Transfer of Care (Signed)
Immediate Anesthesia Transfer of Care Note  Patient: Martin [name]  Procedure(s) Performed: Lower Extremity Angiography (Left: Leg Lower)  Patient Location:  [facility]   Anesthesia Type:General w/mask airway   Level of Consciousness: drowsy and patient cooperative  Airway & Oxygen Therapy: Patient Spontanous Breathing and Patient connected to face mask oxygen  Post-op Assessment: Report given to RN and Post -op Vital signs reviewed and stable  Post vital signs: Reviewed and stable  Last Vitals:  Vitals Value Taken Time  BP    Temp    Pulse    Resp    SpO2      Last Pain:  Vitals:   11/04/22 1122  TempSrc: Oral  PainSc: 0-No pain         Complications: There were no known notable events for this encounter.

## 2022-11-04 NOTE — Op Note (Signed)
[facility]  Percutaneous Study/Intervention Procedural Note   Date of Surgery: 11/04/2022  Surgeon:  [name]  Pre-operative Diagnosis: Atherosclerotic occlusive disease bilateral lower extremities with left lower extremity with rest pain.  Post-operative diagnosis:  Same  Procedure(s) Performed:             1.  Introduction catheter into left lower extremity 3rd order catheter placement               2.    Contrast injection left lower extremity for distal runoff             3.  Percutaneous transluminal angioplasty and stent placement left popliteal artery             4.  Percutaneous transluminal angioplasty left profunda femoris             5.  Percutaneous transluminal angioplasty left superficial femoral artery within the previously placed stent.             6.  Percutaneous transluminal angioplasty left tibioperoneal trunk and peroneal to 4 mm             5.  Star close closure right common femoral arteriotomy  Anesthesia: General by anesthesia  Sheath: 6 [place] Rabie right common femoral retrograde   Contrast: 55 cc  Fluoroscopy Time: 13.0 minutes  Indications:  Ulyess Mort presents with increasing rest pain of the left lower extremity.  Noninvasive studies as well as physical examination show a significant deterioration in his arterial status.  This suggests the patient is having limb threatening ischemia. The risks and benefits are reviewed all questions answered patient agrees to proceed.  Procedure:   Martin [name] is a 62 y.o. y.o. male who was identified and appropriate procedural time out was performed.  The patient was then placed supine on the table and prepped and draped in the usual sterile fashion.    Ultrasound was placed in the sterile sleeve and the right groin was evaluated the right common femoral artery was echolucent and pulsatile indicating patency.  Image was recorded for the permanent record and under real-time  visualization a microneedle was inserted into the common femoral artery followed by the microwire and then the micro-sheath.  A J-wire was then advanced through the micro-sheath and a  5 [place] sheath was then inserted over a J-wire. J-wire was then advanced and a 5 French pigtail catheter was positioned at the level of T12.  AP projection of the aorta was then obtained. Pigtail catheter was repositioned to above the bifurcation and a RAO view of the pelvis was obtained.  Subsequently a pigtail catheter with an Advantage wire was used to cross the aortic bifurcation.  The catheter and wire were advanced down into the left distal external iliac artery. Oblique view of the femoral bifurcation was then obtained and subsequently the wire was reintroduced and the pigtail catheter negotiated into the SFA representing third order catheter placement. Distal runoff was then performed.  6000 units of heparin was then given and allowed to circulate for several minutes.  A 6 French Rabie sheath was advanced up and over the bifurcation and positioned in the femoral artery  KMP catheter and advantage Glidewire were then negotiated down into the distal profunda femoris artery.  A 4 mm x 40 mm Lutonix drug-eluting balloon was used to angioplasty the profunda femoris.  The inflation was for 1 minute at 10 atm. Follow-up imaging demonstrated patency with less than  10% residual stenosis.  The detector was then positioned in the wire and catheter negotiated into the SFA.  The sheath is then advanced so that is positioned with its tip approximately 2 to 3 cm into the SFA.  Advantage wire was then advanced down into the distal popliteal under fluoroscopic guidance.  Hand-injection contrast is used to demonstrate the multiple greater than 70% stenoses within the previously placed stent in the SFA.  A 6 mm x 22 cm Lutonix drug-eluting balloon balloon was advanced into the SFA.  Inflation was to 10 atmospheres for 1 minute. Follow-up  imaging demonstrated patency with less than 10% residual stenosis.   Imaging of the mid popliteal demonstrates a greater than 80% stenosis this is a focal lesion and is the same 1 that was treated in May 2023.  Given this finding, I elected to stent this lesion as it was a recurrence in less than a year.  A 6 mm x 40 mm life stent is deployed across the lesion and then postdilated with a 5 mm x 40 mm Lutonix drug-eluting balloon.  Follow-up imaging demonstrated less than 10% residual stenosis.  Magnified imaging of the trifurcation was then obtained.  The previously treated ostial lesion of the anterior tibial appears to be widely patent with less than 10% residual stenosis.  However, the tibioperoneal trunk and proximal peroneal appears to be occluded.  Using a Kumpe catheter and a variety of different wires including an advantage 035 as well as an advantage 018 I was able to cross the occlusion and advance the Kumpe catheter.  Hand-injection contrast demonstrated intraluminal positioning in the peroneal artery.  A 2 mm x 100 mm Ultraverse balloon is advanced across the proximal peroneal and TP trunk.  Inflation is to 12 atm for 1 minute.  A 0.01 4 wire was then advanced through the balloon and the balloon removed.  A 3 mm x 100 mm ultra score balloon was then advanced across the this lesion inflated to 12 atm for 1 full minute.  Next a 4 mm x 100 mm Lutonix drug-eluting balloon was advanced across the lesion inflated to 4 mm for 30 to 60 seconds.  It was then deflated pulled back by Tuesday centimeters and then reinflated to 12 atm for 1 full minute.  Follow-up imaging now demonstrated patency of the tibioperoneal trunk and proximal peroneal with less than 10% residual stenosis.  Distal runoff now demonstrates an intact peroneal as well as anterior tibial artery all the way to the foot.  After review of these images the sheath is pulled into the right external iliac oblique of the common femoral is obtained  and a Star close device deployed. There no immediate Complications.  Findings:  The abdominal aorta is opacified with a bolus injection contrast. Renal arteries are widely patent without evidence of hemodynamically significant stenosis.  The aorta itself has diffuse disease but no hemodynamically significant lesions. The common and external iliac arteries are widely patent bilaterally.  The left common femoral is widely patent.  The profunda femoris demonstrates a focal 90% stenosis in the main trunk approximately halfway down, this lesion extends over approximately 15 to 20 mm.  The SFA does indeed have a significant stenosis there are greater than 60% stenoses throughout the stented segment these appear focal each 1 measuring 15 to 20 mm in length.  There are 4 or 5 noted.  The mid popliteal demonstrates a focal 90% stenosis. Occlusion of the tibioperoneal trunk, peroneal and posterior tibial arteries.  The anterior tibial artery is widely patent as noted above the previous ostial lesion appears to be widely patent.    Following angioplasty tibioperoneal trunk and peroneal now is in-line flow and looks quite nice with less than 10% residual stenosis. Angioplasty of the SFA yields an excellent result with less than 10% residual stenosis.  Angioplasty and stent placement of the popliteal demonstrates excellent result with less than 10% residual stenosis.  Angioplasty of the profunda femoris demonstrates an excellent result with less than 10% residual stenosis.     Summary: Successful recanalization left lower extremity for limb salvage                           Disposition: Patient was taken to the recovery room in stable condition having tolerated the procedure well.  Belenda Cruise Crewe Heathman 11/04/2022,2:45 PM

## 2022-11-05 ENCOUNTER — Encounter: Payer: Self-pay | Admitting: Vascular Surgery

## 2022-11-10 ENCOUNTER — Encounter: Payer: Self-pay | Admitting: Vascular Surgery

## 2022-11-10 ENCOUNTER — Telehealth (INDEPENDENT_AMBULATORY_CARE_PROVIDER_SITE_OTHER): Payer: Self-pay

## 2022-11-10 DIAGNOSIS — F1120 Opioid dependence, uncomplicated: Secondary | ICD-10-CM | POA: Diagnosis not present

## 2022-11-10 NOTE — Telephone Encounter (Signed)
Patient called about pain behind left knee. Lower left leg angio was done on 11/04/22. The pain started the day after. He states gets relief when he adds pressure to it  while standing, but when he takes the pressure off it hurts. He also states that his left hip hurts as well. Hip pain started 2 days ago. Patient would like to know is it normal and what should he do.  Please advise

## 2022-11-11 ENCOUNTER — Other Ambulatory Visit (INDEPENDENT_AMBULATORY_CARE_PROVIDER_SITE_OTHER): Payer: Self-pay | Admitting: Nurse Practitioner

## 2022-11-11 MED ORDER — HYDROCODONE-ACETAMINOPHEN 5-325 MG PO TABS: 1.0000 | ORAL_TABLET | Freq: Four times a day (QID) | ORAL | 0 refills | Status: AC | PRN

## 2022-11-11 NOTE — Telephone Encounter (Signed)
This is some postprocedural pain and discomfort, and typically it can take some time to resolve.  The patient may have hip pain related to his lower back.  He can utilize Tylenol or ibuprofen for pain.

## 2022-11-11 NOTE — Telephone Encounter (Signed)
Spoke with patient.

## 2022-11-11 NOTE — Telephone Encounter (Signed)
Patient has called 2 times today and left voice mails about pain. I returned his calls to tell him to take tylenol or ibuprofen and he said neither are working and may need something stronger. He wants to know how long will this pain last?

## 2022-11-11 NOTE — Telephone Encounter (Signed)
I sent in a one time Rx for hydrocodone.  The pain is variable but for some people it can last a few weeks.

## 2022-11-20 ENCOUNTER — Ambulatory Visit (INDEPENDENT_AMBULATORY_CARE_PROVIDER_SITE_OTHER): Payer: Medicare HMO | Admitting: Orthopaedic Surgery

## 2022-11-20 ENCOUNTER — Encounter: Payer: Self-pay | Admitting: Orthopaedic Surgery

## 2022-11-20 DIAGNOSIS — G5601 Carpal tunnel syndrome, right upper limb: Secondary | ICD-10-CM

## 2022-11-20 NOTE — Progress Notes (Signed)
Office Visit Note   Patient: Martin [name]           Date of Birth: 03-12-1961           MRN: [id number] Visit Date: 11/20/2022              Requested by: Camillia Herter, NP [place],  [place] [address] PCP: Camillia Herter, NP   Assessment & Plan: Visit Diagnoses:  1. Right carpal tunnel syndrome     Plan: Impression is right carpal tunnel syndrome.  Injection was effective for a few weeks.  At this point the next option is surgical release which she is interested in but he will need to check with his vascular surgeon to see when he is allowed to stop his blood thinners since he recently had a stent.  Follow-Up Instructions: No follow-ups on file.   Orders:  No orders of the defined types were placed in this encounter.  No orders of the defined types were placed in this encounter.     Procedures: No procedures performed   Clinical Data: No additional findings.   Subjective: Chief Complaint  Patient presents with   Right Wrist - Follow-up    HPI  Amod returns today for follow-up for right carpal tunnel syndrome.  Injection worked for couple weeks.  Review of Systems   Objective: Vital Signs: There were no vitals taken for this visit.  Physical Exam  Ortho Exam  Examination right hand unchanged.  Specialty Comments:  No specialty comments available.  Imaging: No results found.   PMFS History: Patient Active Problem List   Diagnosis Date Noted   Right carpal tunnel syndrome 10/28/2022   Protein-calorie malnutrition, severe 04/29/2022   Cellulitis of right lower extremity 04/28/2022   Sepsis 10/12/2021   Peripheral vascular disease    AKI    Tobacco use    COPD (chronic obstructive pulmonary disease) (HCC)    BPH     Periodontal disease    IVDU, cocaine use    Demand ischemia    Thrombocytopenia    Normocytic anemia    MSSA bacteremia 10/11/2021   Callus 06/12/2021   Hav (hallux abducto valgus), unspecified  laterality 06/12/2021   Hammer toes, bilateral 06/12/2021   Atherosclerosis of native arteries of extremity with intermittent claudication ([place]) 06/12/2021   Hyperlipidemia 06/07/2021   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   Elevated blood-pressure reading, without diagnosis of hypertension 09/26/2020   Body mass index (BMI) 29.0-29.9, adult 08/29/2020   Partial small bowel obstruction ([place]) 03/10/2020   Small bowel obstruction due to adhesions ([place]) 03/08/2020   Enteritis 10/15/2019   Abnormal liver function 10/15/2019   Leukocytosis 10/15/2019   Nausea & vomiting 10/15/2019   GAD (generalized anxiety disorder) 05/18/2017   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   Constipation 04/11/2017   Bee sting 04/05/2017   Diverticulosis 11/29/2014   Healthcare maintenance 11/06/2014   Cough 10/05/2014   Insomnia 09/13/2014   Erectile dysfunction 11/30/2013   Chronic pain syndrome 11/30/2013   Depression 11/30/2013   GERD (gastroesophageal reflux disease)    Hypertension    Anxiety    Arthritis    Osteoarthritis of lumbar spine    Radiation    Night sweats 08/12/2012   Tobacco abuse counseling 05/26/2012   Lymphocytic colitis 05/03/2012   Vitamin B12 deficiency 03/19/2012   Folate deficiency 03/19/2012   Inguinal hernia 11/24/2011   Cancer ([place]) 10/14/2011   Internal hemorrhoid 09/11/2011   Anal cancer  10/09/2010   Past Medical History:  Diagnosis Date   Allergy 10/2011   Anal cancer ([place]) 10/14/2011   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   Peripheral vascular disease ([place])    Pneumonia    as child, cough at present with no fever   Pneumonia    as child, cough at present with no fever    Radiation 11/19/11-01/08/12   5040 cGy 28 fx Pelvis and inguinal area   Stroke ([place])    no weaknes or paralysis   Substance abuse ([place])     Family History  Problem Relation Age of Onset   Colon cancer Father    Asthma Father    Cancer Father        prostate   Hypertension Father    Asthma Mother    Hypertension Mother    Stomach cancer Neg Hx    Esophageal cancer Neg Hx     Past Surgical History:  Procedure Laterality Date   APPENDECTOMY     age 35 or 22   EXAMINATION UNDER ANESTHESIA  10/14/2011   Procedure: EXAM UNDER ANESTHESIA;  Surgeon: Earnstine Regal, MD;  Location: [facility];  Service: General;  Laterality: N/A;  exam under anethesia, Excision of mass anal canal, 1.5cm   INGUINAL HERNIA REPAIR  05/11/2012   Procedure: HERNIA REPAIR INGUINAL ADULT;  Surgeon: Earnstine Regal, MD;  Location: [facility];  Service: General;  Laterality: Left;  left inguinal hernia repair with mesh   LOWER EXTREMITY ANGIOGRAPHY Left 07/02/2021   Procedure: LOWER EXTREMITY ANGIOGRAPHY;  Surgeon: Katha Cabal, MD;  Location: [facility];  Service: Cardiovascular;  Laterality: Left;   LOWER EXTREMITY ANGIOGRAPHY Right 07/16/2021   Procedure: LOWER EXTREMITY ANGIOGRAPHY;  Surgeon: Katha Cabal, MD;  Location: [facility];  Service: Cardiovascular;  Laterality: Right;   LOWER EXTREMITY ANGIOGRAPHY Left 08/27/2021   Procedure: LOWER EXTREMITY ANGIOGRAPHY;  Surgeon: Katha Cabal, MD;  Location: [facility];  Service: Cardiovascular;  Laterality: Left;   LOWER EXTREMITY ANGIOGRAPHY Left 01/14/2022   Procedure: Lower Extremity Angiography;  Surgeon: Katha Cabal, MD;  Location: [facility];  Service:  Cardiovascular;  Laterality: Left;   LOWER EXTREMITY ANGIOGRAPHY Left 11/04/2022   Procedure: Lower Extremity Angiography;  Surgeon: Katha Cabal, MD;  Location: [facility];  Service: Cardiovascular;  Laterality: Left;   SMALL INTESTINE SURGERY  04/19/2017   exploratory lap, partial small bowel resection for perforated ulcer/abcess, radiation enteritis   surgical pathology   10/14/2011   squamous cell ca of anus   TEE WITHOUT CARDIOVERSION N/A 10/16/2021   Procedure: TRANSESOPHAGEAL ECHOCARDIOGRAM (TEE);  Surgeon: [name], MD;  Location: [facility];  Service: Cardiovascular;  Laterality: N/A;   TOOTH EXTRACTION N/A 12/04/2021   Procedure: DENTAL RESTORATION/EXTRACTIONS;  Surgeon: Diona Browner, DMD;  Location: [facility];  Service: Oral Surgery;  Laterality: N/A;   transanal excision  10/14/2011   Dr.Todd Gerkin   Social History   Occupational History   Occupation: unemployed  Tobacco Use   Smoking status: Every Day    Packs/day: 1.00    Years: 47.00    Additional pack years: 0.00    Total pack years: 47.00    Types: Cigarettes   Smokeless tobacco: Never   Tobacco comments:    Cutting back>> Quit Smart card supplied  Scientific laboratory technician Use: Never used  Substance and Sexual Activity   Alcohol use: Not Currently    Comment: stopped years ago   Drug use: Not Currently    Types: Cocaine, Marijuana, Methamphetamines   Sexual activity: Yes    Birth control/protection: Condom

## 2022-11-24 ENCOUNTER — Telehealth: Payer: Self-pay | Admitting: Orthopaedic Surgery

## 2022-11-24 DIAGNOSIS — F1120 Opioid dependence, uncomplicated: Secondary | ICD-10-CM | POA: Diagnosis not present

## 2022-11-24 NOTE — Telephone Encounter (Signed)
Patient would like to schedule right carpal tunnel release, but stated he was to find out if he could come off of the blood thinners long enough to have the surgery.  He is on Eliquis and Plavix.  He did ask at [facility] but was told we would need to send a request to nurse practitioner Eulogio Ditch or Dr. Ella Jubilee.  Please provide surgery order.  Thanks

## 2022-11-25 NOTE — Telephone Encounter (Signed)
done

## 2022-11-27 ENCOUNTER — Other Ambulatory Visit (INDEPENDENT_AMBULATORY_CARE_PROVIDER_SITE_OTHER): Payer: Self-pay | Admitting: Vascular Surgery

## 2022-11-27 DIAGNOSIS — Z95820 Peripheral vascular angioplasty status with implants and grafts: Secondary | ICD-10-CM

## 2022-11-27 DIAGNOSIS — I70222 Atherosclerosis of native arteries of extremities with rest pain, left leg: Secondary | ICD-10-CM

## 2022-12-01 ENCOUNTER — Encounter (HOSPITAL_BASED_OUTPATIENT_CLINIC_OR_DEPARTMENT_OTHER): Payer: Self-pay | Admitting: Orthopaedic Surgery

## 2022-12-01 ENCOUNTER — Other Ambulatory Visit: Payer: Self-pay

## 2022-12-02 ENCOUNTER — Ambulatory Visit (HOSPITAL_COMMUNITY)
Admission: EM | Admit: 2022-12-02 | Discharge: 2022-12-02 | Disposition: A | Discharge status: Home / Self Care | Payer: Medicare HMO | Attending: Family Medicine | Admitting: Family Medicine

## 2022-12-02 ENCOUNTER — Ambulatory Visit (INDEPENDENT_AMBULATORY_CARE_PROVIDER_SITE_OTHER): Payer: Medicare HMO

## 2022-12-02 ENCOUNTER — Encounter (HOSPITAL_COMMUNITY): Payer: Self-pay

## 2022-12-02 DIAGNOSIS — M7989 Other specified soft tissue disorders: Secondary | ICD-10-CM | POA: Diagnosis not present

## 2022-12-02 DIAGNOSIS — M792 Neuralgia and neuritis, unspecified: Secondary | ICD-10-CM | POA: Diagnosis not present

## 2022-12-02 DIAGNOSIS — M25531 Pain in right wrist: Secondary | ICD-10-CM

## 2022-12-02 MED ORDER — GABAPENTIN 100 MG PO CAPS: 100.0000 mg | ORAL_CAPSULE | Freq: Every day | ORAL | 0 refills | Status: DC

## 2022-12-02 MED ORDER — METHYLPREDNISOLONE ACETATE 80 MG/ML IJ SUSP
INTRAMUSCULAR | Status: AC
  Filled 2022-12-02: qty 1

## 2022-12-02 MED ORDER — METHYLPREDNISOLONE ACETATE 80 MG/ML IJ SUSP
80.0000 mg | Freq: Once | INTRAMUSCULAR | Status: AC
  Administered 2022-12-02: 80 mg via INTRAMUSCULAR

## 2022-12-02 NOTE — Discharge Instructions (Signed)
Your x-ray did not show any bony abnormalities except for some arthritis.  You were given a shot of methylprednisolone 80 mg here in the office  Gabapentin 100 mg--the prescription states to take 1 to 3 capsules at bedtime.  Start with 1 capsule at bedtime nightly.  Increase to 2 capsules nightly after 2 or 3 days, and then to 3 capsules in another 2 or 3 days, if the burning pain in your fingers is not completely relieved.  If only partially helped by this medication or if not relieved at all, please speak to your doctors.  If the methylprednisolone injection makes the pain improved without any other treatment, then you do not have to start the gabapentin

## 2022-12-02 NOTE — ED Triage Notes (Signed)
Pt states right wrist pain and swelling states he has carpel tunnel and is supposed to have surgery on 12/10/22. Wrist brace in place. States he has been taking tylenol at home with no relief.

## 2022-12-02 NOTE — ED Provider Notes (Signed)
[facility]    CSN: [id number] Arrival date & time: 12/02/22  1339      History   Chief Complaint Chief Complaint  Patient presents with   Joint Swelling    HPI Martin [name] is a 62 y.o. male.   HPI Here with pain and swelling in his right wrist, which started in the last 2 to 3 days.  He is also having worsening burning pain in his right middle and ring finger.  He has been diagnosed with carpal tunnel and is wearing a brace.  He is to have surgery in the next week for the carpal tunnel.  He was given a cortisone injection about 1 month ago in the wrist.  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   Peripheral vascular disease ([place])    Pneumonia    as child, cough at present with no fever   Pneumonia    as child, cough at present with no fever    Radiation 11/19/11-01/08/12   5040 cGy 28 fx Pelvis and inguinal area   Stroke ([place])    no weaknes or paralysis   Substance abuse ([place])    last use was Jan. 2024; patient states he has "been clean" since then    Patient Active Problem List   Diagnosis Date Noted   Right carpal tunnel syndrome 10/28/2022   Protein-calorie malnutrition, severe 04/29/2022   Cellulitis of right lower extremity 04/28/2022   Sepsis 10/12/2021   Peripheral vascular disease    AKI    Tobacco use    COPD (chronic  obstructive pulmonary disease) (HCC)    BPH     Periodontal disease    IVDU, cocaine use    Demand ischemia    Thrombocytopenia    Normocytic anemia    MSSA bacteremia 10/11/2021   Callus 06/12/2021   Hav (hallux abducto valgus), unspecified laterality 06/12/2021   Hammer toes, bilateral 06/12/2021   Atherosclerosis of native arteries of extremity with intermittent claudication ([place]) 06/12/2021   Hyperlipidemia 06/07/2021   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   Elevated blood-pressure reading, without diagnosis of hypertension 09/26/2020   Body mass index (BMI) 29.0-29.9, adult 08/29/2020   Partial small bowel obstruction ([place]) 03/10/2020   Small bowel obstruction due to adhesions ([place]) 03/08/2020   Enteritis 10/15/2019   Abnormal liver function 10/15/2019   Leukocytosis 10/15/2019   Nausea & vomiting 10/15/2019   GAD (generalized anxiety disorder) 05/18/2017   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   Constipation 04/11/2017   Bee sting 04/05/2017   Diverticulosis 11/29/2014   Healthcare maintenance 11/06/2014   Cough 10/05/2014   Insomnia 09/13/2014   Erectile dysfunction 11/30/2013   Chronic pain syndrome 11/30/2013   Depression 11/30/2013   GERD (gastroesophageal reflux disease)  Hypertension    Anxiety    Arthritis    Osteoarthritis of lumbar spine    Radiation    Night sweats 08/12/2012   Tobacco abuse counseling 05/26/2012   Lymphocytic colitis 05/03/2012   Vitamin B12 deficiency 03/19/2012   Folate deficiency 03/19/2012   Inguinal hernia 11/24/2011   Cancer ([place]) 10/14/2011   Internal hemorrhoid 09/11/2011   Anal cancer 10/09/2010    Past Surgical History:  Procedure Laterality Date   APPENDECTOMY     age 30 or 14   EXAMINATION UNDER ANESTHESIA  10/14/2011   Procedure: EXAM UNDER ANESTHESIA;  Surgeon: Earnstine Regal, MD;  [facility];  Service: General;  Laterality: N/A;  exam under anethesia, Excision of mass anal canal, 1.5cm   INGUINAL  HERNIA REPAIR  05/11/2012   Procedure: HERNIA REPAIR INGUINAL ADULT;  Surgeon: Earnstine Regal, MD;  Location: [facility];  Service: General;  Laterality: Left;  left inguinal hernia repair with mesh   LOWER EXTREMITY ANGIOGRAPHY Left 07/02/2021   Procedure: LOWER EXTREMITY ANGIOGRAPHY;  Surgeon: Katha Cabal, MD;  Location: [facility];  Service: Cardiovascular;  Laterality: Left;   LOWER EXTREMITY ANGIOGRAPHY Right 07/16/2021   Procedure: LOWER EXTREMITY ANGIOGRAPHY;  Surgeon: Katha Cabal, MD;  Location: [facility] [facility];  Service: Cardiovascular;  Laterality: Right;   LOWER EXTREMITY ANGIOGRAPHY Left 08/27/2021   Procedure: LOWER EXTREMITY ANGIOGRAPHY;  Surgeon: Katha Cabal, MD;  Location: [facility];  Service: Cardiovascular;  Laterality: Left;   LOWER EXTREMITY ANGIOGRAPHY Left 01/14/2022   Procedure: Lower Extremity Angiography;  Surgeon: Katha Cabal, MD;  Location: [facility];  Service: Cardiovascular;  Laterality: Left;   LOWER EXTREMITY ANGIOGRAPHY Left 11/04/2022   Procedure: Lower Extremity Angiography;  Surgeon: Katha Cabal, MD;  Location: [facility];  Service: Cardiovascular;  Laterality: Left;   SMALL INTESTINE SURGERY  04/19/2017   exploratory lap, partial small bowel resection for perforated ulcer/abcess, radiation enteritis   surgical pathology   10/14/2011   squamous cell ca of anus   TEE WITHOUT CARDIOVERSION N/A 10/16/2021   Procedure: TRANSESOPHAGEAL ECHOCARDIOGRAM (TEE);  Surgeon: [name], MD;  [facility];  Service: Cardiovascular;  Laterality: N/A;   TOOTH EXTRACTION N/A 12/04/2021   Procedure: DENTAL RESTORATION/EXTRACTIONS;  Surgeon: Diona Browner, DMD;  Location: [facility];  Service: Oral Surgery;  Laterality: N/A;   transanal excision  10/14/2011   Dr.Todd Gerkin       Home Medications    Prior to Admission medications   Medication Sig Start Date End Date  Taking? Authorizing Provider  gabapentin (NEURONTIN) 100 MG capsule Take 1-3 capsules (100-300 mg total) by mouth at bedtime. 12/02/22  Yes Barrett Henle, MD  albuterol (VENTOLIN HFA) 108 (90 Base) MCG/ACT inhaler Inhale 2 puffs into the lungs every 6 (six) hours as needed for wheezing or shortness of breath. 07/23/22   White, Leitha Schuller, NP  apixaban (ELIQUIS) 2.5 MG TABS tablet Take 1 tablet (2.5 mg total) by mouth 2 (two) times daily. 01/14/22   Schnier, Dolores Lory, MD  atorvastatin (LIPITOR) 10 MG tablet Take 1 tablet (10 mg total) by mouth daily. Additional refills per primary provider 11/04/22 11/04/23  Schnier, Dolores Lory, MD  clopidogrel (PLAVIX) 75 MG tablet Take 1 tablet (75 mg total) by mouth daily. 10/07/22   Kris Hartmann, NP  COMBIVENT RESPIMAT 20-100 MCG/ACT AERS respimat Inhale 1 puff into the lungs 4 (four) times daily. 08/21/22  [provider]  econazole nitrate 1 % cream Apply topically daily. 01/17/22   Nyoka Lint, PA-C  EPINEPHrine (EPIPEN 2-PAK) 0.3 mg/0.3 mL IJ SOAJ injection Inject 0.3 mg (1 pen) into the muscle as needed for anaphylaxis. 05/01/22   Raiford Noble Latif, DO  feeding supplement (ENSURE ENLIVE / ENSURE PLUS) LIQD Take 237 mLs by mouth 3 (three) times daily between meals. 05/01/22   Sheikh, Omair Latif, DO  fluticasone furoate-vilanterol (BREO ELLIPTA) 100-25 MCG/ACT AEPB Inhale 1 puff into the lungs daily. 01/22/22   Crain, Whitney L, PA  folic acid (FOLVITE) 1 MG tablet Take 1 tablet (1 mg total) by mouth daily. 05/01/22   Sheikh, Omair Latif, DO  ipratropium-albuterol (DUONEB) 0.5-2.5 (3) MG/3ML SOLN Take 3 mLs by nebulization every 6 (six) hours as needed. 05/27/22   Mound, Michele Rockers, FNP  METHADONE HCL PO Take 85 mg by mouth daily.    [provider]  Multiple Vitamin (MULTIVITAMIN WITH MINERALS) TABS tablet Take 1 tablet by mouth daily. 05/01/22   Sheikh, Georgina Quint Latif, DO  nicotine (NICODERM CQ - DOSED IN MG/24 HOURS) 21 mg/24hr patch Place 1 patch  (21 mg total) onto the skin daily. 05/01/22   Sheikh, Omair Latif, DO  senna-docusate (SENOKOT-S) 8.6-50 MG tablet Take 1 tablet by mouth at bedtime as needed for mild constipation. 05/01/22   Raiford Noble Latif, DO  tamsulosin (FLOMAX) 0.4 MG CAPS capsule Take 1 capsule (0.4 mg total) by mouth at bedtime. 09/16/22   Dorna Mai, MD  tiZANidine (ZANAFLEX) 4 MG tablet Take 1 tablet (4 mg total) by mouth every 6 (six) hours as needed for muscle spasms. 09/22/22   Nyoka Lint, PA-C  sucralfate (CARAFATE) 1 GM/10ML suspension Take 10 mLs (1 g total) by mouth 4 (four) times daily -  with meals and at bedtime. Patient not taking: Reported on 11/28/2019 10/21/19 06/13/20  Randal Buba, April, MD    Family History Family History  Problem Relation Age of Onset   Colon cancer Father    Asthma Father    Cancer Father        prostate   Hypertension Father    Asthma Mother    Hypertension Mother    Stomach cancer Neg Hx    Esophageal cancer Neg Hx     Social History Social History   Tobacco Use   Smoking status: Every Day    Packs/day: 1.00    Years: 47.00    Additional pack years: 0.00    Total pack years: 47.00    Types: Cigarettes   Smokeless tobacco: Never   Tobacco comments:    Cutting back>> Quit Smart card supplied  Vaping Use   Vaping Use: Never used  Substance Use Topics   Alcohol use: Not Currently    Comment: stopped years ago   Drug use: Not Currently    Types: Cocaine, Marijuana, Methamphetamines     Allergies   Aspirin, Yellow jacket venom [bee venom], Codeine, Ibuprofen, and Morphine and related   Review of Systems Review of Systems   Physical Exam Triage Vital Signs ED Triage Vitals  Enc Vitals Group     BP 12/02/22 1406 (!) 167/53     Pulse Rate 12/02/22 1406 73     Resp 12/02/22 1406 16     Temp 12/02/22 1406 98.1 F (36.7 C)     Temp Source 12/02/22 1406 Oral     SpO2 12/02/22 1406 98 %     Weight --  Height --      Head Circumference --      Peak  Flow --      Pain Score 12/02/22 1408 9     Pain Loc --      Pain Edu? --      Excl. in [place]? --    No data found.  Updated Vital Signs BP (!) 167/53 (BP Location: Left Arm)   Pulse 73   Temp 98.1 F (36.7 C) (Oral)   Resp 16   SpO2 98%   Visual Acuity Right Eye Distance:   Left Eye Distance:   Bilateral Distance:    Right Eye Near:   Left Eye Near:    Bilateral Near:     Physical Exam Vitals reviewed.  Constitutional:      General: He is not in acute distress.    Appearance: He is not ill-appearing, toxic-appearing or diaphoretic.  Musculoskeletal:     Comments: There is a little swelling and puffiness over the volar wrist on the right on the hand.  There is no erythema.  There is tenderness over the wrist.  There is no tenderness but there is some dysesthesia over the third and fourth digits of the right hand on the palmar surface.  Skin:    Coloration: Skin is not jaundiced or pale.  Neurological:     General: No focal deficit present.     Mental Status: He is alert and oriented to person, place, and time.  Psychiatric:        Behavior: Behavior normal.      UC Treatments / Results  Labs (all labs ordered are listed, but only abnormal results are displayed) Labs Reviewed - No data to display  EKG   Radiology DG Wrist Complete Right  Result Date: 12/02/2022 CLINICAL DATA:  Wrist pain and swelling.  No history of trauma. EXAM: RIGHT WRIST - COMPLETE 3+ VIEW COMPARISON:  Hand radiographs 09/30/2022 and wrist radiographs 06/04/2022 FINDINGS: The mineralization and alignment are normal. There is no evidence of acute fracture or dislocation. There are stable mild degenerative changes at the scaphotrapeziotrapezoidal and 1st carpometacarpal joints. Moderate degenerative changes are present at the distal radioulnar joint. Punctate metallic foreign bodies are again seen in the soft tissues along the radial base of the 1st metacarpal, unchanged from previous study.  IMPRESSION: No acute osseous findings. Stable degenerative changes as described. Stable punctate metallic foreign bodies. Electronically Signed   By: Richardean Sale M.D.   On: 12/02/2022 14:56    Procedures Procedures (including critical care time)  Medications Ordered in UC Medications  methylPREDNISolone acetate (DEPO-MEDROL) injection 80 mg (has no administration in time range)    Initial Impression / Assessment and Plan / UC Course  I have reviewed the triage vital signs and the nursing notes.  Pertinent labs & imaging results that were available during my care of the patient were reviewed by me and considered in my medical decision making (see chart for details).       He does not show any acute changes.  There is some degenerative change.  He is already wearing a wrist brace; I am going to give him a depomedrol injection here to see if that will help the inflammation in his wrist at all.  Gabapentin is sent in to target nighttime discomfort and neuropathic pain.  If the steroid injection alone makes it improve he may not have to start the gabapentin.  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  You were given a shot of methylprednisolone 80 mg here in the office  Gabapentin 100 mg--the prescription states to take 1 to 3 capsules at bedtime.  Start with 1 capsule at bedtime nightly.  Increase to 2 capsules nightly after 2 or 3 days, and then to 3 capsules in another 2 or 3 days, if the burning pain in your fingers is not completely relieved.  If only partially helped by this medication or if not relieved at all, please speak to your doctors.  If the methylprednisolone injection makes the pain improved without any other treatment, then you do not have  to start the gabapentin     ED Prescriptions     Medication Sig Dispense Auth. Provider   gabapentin (NEURONTIN) 100 MG capsule Take 1-3 capsules (100-300 mg total) by mouth at bedtime. 90 capsule Keonta Alsip, Gwenlyn Perking, MD      PDMP not reviewed this encounter.   Barrett Henle, MD 12/02/22 [phone]

## 2022-12-03 ENCOUNTER — Ambulatory Visit (INDEPENDENT_AMBULATORY_CARE_PROVIDER_SITE_OTHER): Payer: Medicare HMO

## 2022-12-03 ENCOUNTER — Encounter (INDEPENDENT_AMBULATORY_CARE_PROVIDER_SITE_OTHER): Payer: Self-pay | Admitting: Nurse Practitioner

## 2022-12-03 ENCOUNTER — Ambulatory Visit (INDEPENDENT_AMBULATORY_CARE_PROVIDER_SITE_OTHER): Payer: Medicare HMO | Admitting: Nurse Practitioner

## 2022-12-03 VITALS — BP 157/55 | HR 65 | Resp 16 | Ht 71.0 in | Wt 140.0 lb

## 2022-12-03 DIAGNOSIS — I70222 Atherosclerosis of native arteries of extremities with rest pain, left leg: Secondary | ICD-10-CM | POA: Diagnosis not present

## 2022-12-03 DIAGNOSIS — E785 Hyperlipidemia, unspecified: Secondary | ICD-10-CM

## 2022-12-03 DIAGNOSIS — I10 Essential (primary) hypertension: Secondary | ICD-10-CM | POA: Diagnosis not present

## 2022-12-03 DIAGNOSIS — I739 Peripheral vascular disease, unspecified: Secondary | ICD-10-CM | POA: Diagnosis not present

## 2022-12-03 DIAGNOSIS — Z95820 Peripheral vascular angioplasty status with implants and grafts: Secondary | ICD-10-CM

## 2022-12-03 DIAGNOSIS — Z9889 Other specified postprocedural states: Secondary | ICD-10-CM | POA: Diagnosis not present

## 2022-12-03 NOTE — Progress Notes (Signed)
Subjective:    Patient ID: Martin [name], male    DOB: May 28, 1961, 62 y.o.   MRN: [id number] Chief Complaint  Patient presents with   Follow-up    1 mointh follow uo with ABI & L leg Art dup    The patient returns to the office for followup and review status post angiogram with intervention on 02/027/2024.   Procedure: Procedure(s) Performed:             1.  Introduction catheter into left lower extremity 3rd order catheter placement               2.    Contrast injection left lower extremity for distal runoff             3.  Percutaneous transluminal angioplasty and stent placement left popliteal artery             4.  Percutaneous transluminal angioplasty left profunda femoris             5.  Percutaneous transluminal angioplasty left superficial femoral artery within the previously placed stent.             6.  Percutaneous transluminal angioplasty left tibioperoneal trunk and peroneal to 4 mm             5.  Star close closure right common femoral arteriotomy   The patient notes improvement in the lower extremity symptoms. No interval shortening of the patient's claudication distance or rest pain symptoms. No new ulcers or wounds have occurred since the last visit.  There have been no significant changes to the patient's overall health care.  No documented history of amaurosis fugax or recent TIA symptoms. There are no recent neurological changes noted. No documented history of DVT, PE or superficial thrombophlebitis. The patient denies recent episodes of angina or shortness of breath.   ABI's Rt=0.91 and Lt=0.91  (previous ABI's Rt=1.15 and Lt=1.15) Duplex US of the bilateral tibial vessels show biphasic waveforms.  The patient has noted biphasic waveforms throughout the left lower extremity.   Review of Systems  Musculoskeletal:  Positive for arthralgias.  All other systems reviewed and are negative.      Objective:   Physical Exam Vitals reviewed.  HENT:      Head: Normocephalic.  Cardiovascular:     Rate and Rhythm: Normal rate.  Pulmonary:     Effort: Pulmonary effort is normal.  Skin:    General: Skin is warm and dry.  Neurological:     Mental Status: He is alert and oriented to person, place, and time.  Psychiatric:        Mood and Affect: Mood normal.        Behavior: Behavior normal.        Thought Content: Thought content normal.        Judgment: Judgment normal.    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   Peripheral vascular disease ([place])    Pneumonia    as child, cough at present with no fever   Pneumonia    as child, cough at present with no fever    Radiation 11/19/11-01/08/12   5040 cGy 28 fx Pelvis and inguinal area   Stroke [facility])    no weaknes or paralysis   Substance abuse [facility])    last use was Jan. 2024; patient states he has "been clean" since then    Social History   Socioeconomic History   Marital status: Widowed    Spouse name: Not on file   Number of children: 3   Years of education: Not on file   Highest education level: Not on file  Occupational History   Occupation: unemployed  Tobacco Use   Smoking status: Every Day    Packs/day: 1.00    Years: 47.00     Additional pack years: 0.00    Total pack years: 47.00    Types: Cigarettes   Smokeless tobacco: Never   Tobacco comments:    Cutting back>> Quit Smart card supplied  Scientific laboratory technician Use: Never used  Substance and Sexual Activity   Alcohol use: Not Currently    Comment: stopped years ago   Drug use: Not Currently    Types: Cocaine, Marijuana, Methamphetamines   Sexual activity: Yes    Birth control/protection: Condom  Other Topics Concern   Not on file  Social History Narrative   Not on file   Social Determinants of Health   Financial Resource Strain: Not on file  Food Insecurity: Not on file  Transportation Needs: Not on file  Physical Activity: Not on file  Stress: Not on file  Social Connections: Not on file  Intimate Partner Violence: Not on file    Past Surgical History:  Procedure Laterality Date   APPENDECTOMY     age 35 or 51   EXAMINATION UNDER ANESTHESIA  10/14/2011   Procedure: EXAM UNDER ANESTHESIA;  Surgeon: Earnstine Regal, MD;  Location: [facility];  Service: General;  Laterality: N/A;  exam under anethesia, Excision of mass anal canal, 1.5cm   INGUINAL HERNIA REPAIR  05/11/2012   Procedure: HERNIA REPAIR INGUINAL ADULT;  Surgeon: Earnstine Regal, MD;  Location: [place];  Service: General;  Laterality: Left;  left inguinal hernia repair with mesh   LOWER EXTREMITY ANGIOGRAPHY Left 07/02/2021   Procedure: LOWER EXTREMITY ANGIOGRAPHY;  Surgeon: Katha Cabal, MD;  Location: [place] [facility];  Service: Cardiovascular;  Laterality: Left;   LOWER EXTREMITY ANGIOGRAPHY Right 07/16/2021   Procedure: LOWER EXTREMITY ANGIOGRAPHY;  Surgeon: Katha Cabal, MD;  Location: [place] [facility];  Service: Cardiovascular;  Laterality: Right;   LOWER EXTREMITY ANGIOGRAPHY Left 08/27/2021   Procedure: LOWER EXTREMITY ANGIOGRAPHY;  Surgeon: Katha Cabal, MD;  Location: [place] [facility];  Service: Cardiovascular;  Laterality: Left;    LOWER EXTREMITY ANGIOGRAPHY Left 01/14/2022   Procedure: Lower Extremity Angiography;  Surgeon: Katha Cabal, MD;  Location: [place] [facility];  Service: Cardiovascular;  Laterality: Left;   LOWER EXTREMITY  ANGIOGRAPHY Left 11/04/2022   Procedure: Lower Extremity Angiography;  Surgeon: Katha Cabal, MD;  Location: [place] [facility];  Service: Cardiovascular;  Laterality: Left;   SMALL INTESTINE SURGERY  04/19/2017   exploratory lap, partial small bowel resection for perforated ulcer/abcess, radiation enteritis   surgical pathology   10/14/2011   squamous cell ca of anus   TEE WITHOUT CARDIOVERSION N/A 10/16/2021   Procedure: TRANSESOPHAGEAL ECHOCARDIOGRAM (TEE);  Surgeon: [name], MD;  Location: [facility];  Service: Cardiovascular;  Laterality: N/A;   TOOTH EXTRACTION N/A 12/04/2021   Procedure: DENTAL RESTORATION/EXTRACTIONS;  Surgeon: Diona Browner, DMD;  Location: [place];  Service: Oral Surgery;  Laterality: N/A;   transanal excision  10/14/2011   Dr.Todd Gerkin    Family History  Problem Relation Age of Onset   Colon cancer Father    Asthma Father    Cancer Father        prostate   Hypertension Father    Asthma Mother    Hypertension Mother    Stomach cancer Neg Hx    Esophageal cancer Neg Hx     Allergies  Allergen Reactions   Aspirin Swelling    Lip swelling   Yellow Jacket Venom [Bee Venom] Anaphylaxis   Codeine Nausea Only   Ibuprofen     On blood thinners   Morphine And Related Itching       Latest Ref Rng & Units 04/30/2022    5:15 AM 04/29/2022    4:31 AM 04/28/2022   12:57 PM  CBC  WBC 4.0 - 10.5 K/uL 8.9  8.3  11.7   Hemoglobin 13.0 - 17.0 g/dL 9.7  9.3  9.6   Hematocrit 39.0 - 52.0 % 30.8  29.4  29.5   Platelets 150 - 400 K/uL 153  141  168       CMP     Component Value Date/Time   NA 140 04/30/2022 0515   NA 137 03/02/2022 1136   NA 141 10/19/2014 1204   K 4.9 04/30/2022 0515   K 4.6 10/19/2014 1204   CL 110 04/30/2022  0515   CO2 25 04/30/2022 0515   CO2 26 10/19/2014 1204   GLUCOSE 100 (H) 04/30/2022 0515   GLUCOSE 91 10/19/2014 1204   BUN 11 11/04/2022 1113   BUN 11 03/02/2022 1136   BUN 10.8 10/19/2014 1204   CREATININE 0.78 11/04/2022 1113   CREATININE 0.83 07/05/2015 1042   CREATININE 0.9 10/19/2014 1204   CALCIUM 9.1 04/30/2022 0515   CALCIUM 9.6 10/19/2014 1204   PROT 6.9 04/28/2022 1257   PROT 6.5 06/06/2021 1134   PROT 7.3 10/19/2014 1204   ALBUMIN 3.6 04/28/2022 1257   ALBUMIN 4.3 06/06/2021 1134   ALBUMIN 4.0 10/19/2014 1204   AST 19 04/28/2022 1257   AST 15 10/19/2014 1204   ALT 17 04/28/2022 1257   ALT 13 10/19/2014 1204   ALKPHOS 93 04/28/2022 1257   ALKPHOS 83 10/19/2014 1204   BILITOT <0.1 (L) 04/28/2022 1257   BILITOT 0.5 06/06/2021 1134   BILITOT 0.22 10/19/2014 1204   GFRNONAA >60 11/04/2022 1113   GFRNONAA >89 07/05/2015 1042   GFRAA >60 03/10/2020 1219   GFRAA >89 07/05/2015 1042     No results found.     Assessment & Plan:   1. Peripheral arterial disease with history of revascularization ([place]) Recommend:  The patient is status post successful angiogram with intervention.  The patient reports that the claudication symptoms and leg pain has improved.  The patient denies lifestyle limiting changes at this point in time.  No further invasive studies, angiography or surgery at this time The patient should continue walking and begin a more formal exercise program.  The patient should continue antiplatelet therapy and aggressive treatment of the lipid abnormalities  Continued surveillance is indicated as atherosclerosis is likely to progress with time.    Patient should undergo noninvasive studies as ordered. The patient will follow up with me to review the studies.    2. Primary hypertension Continue antihypertensive medications as already ordered, these medications have been reviewed and there are no changes at this time.  3. Hyperlipidemia, unspecified  hyperlipidemia type Continue statin as ordered and reviewed, no changes at this time   Current Outpatient Medications on File Prior to Visit  Medication Sig Dispense Refill   albuterol (VENTOLIN HFA) 108 (90 Base) MCG/ACT inhaler Inhale 2 puffs into the lungs every 6 (six) hours as needed for wheezing or shortness of breath. 18 g 1   apixaban (ELIQUIS) 2.5 MG TABS tablet Take 1 tablet (2.5 mg total) by mouth 2 (two) times daily. 60 tablet 6   atorvastatin (LIPITOR) 10 MG tablet Take 1 tablet (10 mg total) by mouth daily. Additional refills per primary provider 30 tablet 4   clopidogrel (PLAVIX) 75 MG tablet Take 1 tablet (75 mg total) by mouth daily. 30 tablet 11   COMBIVENT RESPIMAT 20-100 MCG/ACT AERS respimat Inhale 1 puff into the lungs 4 (four) times daily.     econazole nitrate 1 % cream Apply topically daily. 15 g 0   EPINEPHrine (EPIPEN 2-PAK) 0.3 mg/0.3 mL IJ SOAJ injection Inject 0.3 mg (1 pen) into the muscle as needed for anaphylaxis. 2 each 0   feeding supplement (ENSURE ENLIVE / ENSURE PLUS) LIQD Take 237 mLs by mouth 3 (three) times daily between meals. 237 mL 12   fluticasone furoate-vilanterol (BREO ELLIPTA) 100-25 MCG/ACT AEPB Inhale 1 puff into the lungs daily. 60 each 0   folic acid (FOLVITE) 1 MG tablet Take 1 tablet (1 mg total) by mouth daily.     gabapentin (NEURONTIN) 100 MG capsule Take 1-3 capsules (100-300 mg total) by mouth at bedtime. 90 capsule 0   ipratropium-albuterol (DUONEB) 0.5-2.5 (3) MG/3ML SOLN Take 3 mLs by nebulization every 6 (six) hours as needed. 360 mL 0   METHADONE HCL PO Take 85 mg by mouth daily.     Multiple Vitamin (MULTIVITAMIN WITH MINERALS) TABS tablet Take 1 tablet by mouth daily. 30 tablet 0   nicotine (NICODERM CQ - DOSED IN MG/24 HOURS) 21 mg/24hr patch Place 1 patch (21 mg total) onto the skin daily. 28 patch 0   senna-docusate (SENOKOT-S) 8.6-50 MG tablet Take 1 tablet by mouth at bedtime as needed for mild constipation. 30 tablet 0    tamsulosin (FLOMAX) 0.4 MG CAPS capsule Take 1 capsule (0.4 mg total) by mouth at bedtime. 90 capsule 1   tiZANidine (ZANAFLEX) 4 MG tablet Take 1 tablet (4 mg total) by mouth every 6 (six) hours as needed for muscle spasms. 30 tablet 0   [DISCONTINUED] sucralfate (CARAFATE) 1 GM/10ML suspension Take 10 mLs (1 g total) by mouth 4 (four) times daily -  with meals and at bedtime. (Patient not taking: Reported on 11/28/2019) 420 mL 0   No current facility-administered medications on file prior to visit.    There are no Patient Instructions on file for this visit. No follow-ups on file.   Kris Hartmann, NP

## 2022-12-04 ENCOUNTER — Telehealth: Payer: Self-pay | Admitting: Orthopaedic Surgery

## 2022-12-04 ENCOUNTER — Telehealth (INDEPENDENT_AMBULATORY_CARE_PROVIDER_SITE_OTHER): Payer: Self-pay

## 2022-12-04 ENCOUNTER — Encounter (INDEPENDENT_AMBULATORY_CARE_PROVIDER_SITE_OTHER): Payer: Self-pay | Admitting: Nurse Practitioner

## 2022-12-04 ENCOUNTER — Telehealth (INDEPENDENT_AMBULATORY_CARE_PROVIDER_SITE_OTHER): Payer: Self-pay | Admitting: Nurse Practitioner

## 2022-12-04 LAB — VAS US ABI WITH/WO TBI
Left ABI: 0.91
Right ABI: 0.91

## 2022-12-04 NOTE — Telephone Encounter (Signed)
Patient in office now wanting to know the status of his surgery.  The  nurse practioner with [facility]  saw the patient yesterday but said he mention nothing of his upcoming surgery.  The patient had recent stent placement and intervention on 11/04/2022. They typically require patient to remain on anticoagulant for at least three months to maintain stent patency.  Patient wanted to know if the surgery could be done while continuing the blood thinners.  I spoke with Dr. Erlinda Hong and he said patient could continue on the Plavix, but Eliquis would need to be held. Currently trying to reach [facility] to let them know the request to hold BOTH blood thinners has changed.

## 2022-12-04 NOTE — Telephone Encounter (Signed)
Office called check on the status of clearance letter that was faxed over. The patient is having carpal tunnel release surgery on 12/10/22 and Dr. Erlinda Hong is requesting that Martin [name] is taken off Eliquis for 3 days and Plavix for 7 days.

## 2022-12-04 NOTE — Telephone Encounter (Signed)
The patient presented to the office very upset and angry given our recommendations for his upcoming arm surgery.  At the patient's office visit it was not noted that he was to have surgery due to his carpal tunnel issues.  I discussed with the patient that because of his recent angioplasty and intervention at this time we do not recommend cessation of anticoagulation.  It is recommended that postinitial intervention the patient maintain for 90 days without pause to decrease the risk of restenosis and thrombosis of the intervention.  Based on this the patient will be able to undergo surgery on April 27 based on our recommendations.  If the patient wishes to move forward with holding his anticoagulation he will do so without our medical approval and he is assuming all risk associated with this.

## 2022-12-08 DIAGNOSIS — F1120 Opioid dependence, uncomplicated: Secondary | ICD-10-CM | POA: Diagnosis not present

## 2022-12-09 ENCOUNTER — Encounter: Payer: Self-pay | Admitting: Physician Assistant

## 2022-12-09 ENCOUNTER — Ambulatory Visit (INDEPENDENT_AMBULATORY_CARE_PROVIDER_SITE_OTHER): Payer: Medicare HMO | Admitting: Orthopaedic Surgery

## 2022-12-09 DIAGNOSIS — G5601 Carpal tunnel syndrome, right upper limb: Secondary | ICD-10-CM | POA: Diagnosis not present

## 2022-12-09 MED ORDER — PREDNISONE 10 MG (21) PO TBPK: ORAL_TABLET | ORAL | 0 refills | Status: DC

## 2022-12-09 NOTE — Progress Notes (Signed)
Office Visit Note   Patient: Martin [name]           Date of Birth: 04-Jul-1961           MRN: [id number] Visit Date: 12/09/2022              Requested by: Camillia Herter, NP [address] [place],  [place] [address] PCP: Camillia Herter, NP   Assessment & Plan: Visit Diagnoses:  1. Right carpal tunnel syndrome     Plan: Impression is right hand carpal tunnel syndrome.  We again discussed various treatment options to include oral steroids versus cortisone injection versus surgery.  Unfortunately, he is unable to proceed with surgery until he has been on Eliquis and Plavix for 3 months following stent placement.  It is too early to repeat cortisone injection.  Her only option is to take oral steroids.  I have sent in a prescription.  He will follow-up with [place] once he is cleared for surgery.  Call with concerns or questions.  Follow-Up Instructions: Return if symptoms worsen or fail to improve.   Orders:  No orders of the defined types were placed in this encounter.  Meds ordered this encounter  Medications   predniSONE (STERAPRED UNI-PAK 21 TAB) 10 MG (21) TBPK tablet    Sig: Take as directed    Dispense:  21 tablet    Refill:  0      Procedures: No procedures performed   Clinical Data: No additional findings.   Subjective: Chief Complaint  Patient presents with   Right Wrist - Pain    HPI patient is a 62 year old gentleman who comes in today with continued paresthesias to the right hand.  History of mild to moderate carpal tunnel syndrome seen on nerve conduction study from 10/15/2022.  He was seen in our office on 10/28/2022 where cortisone injection was performed.  At that point, he was also scheduled for right carpal tunnel release.  Unfortunately, the surgery had to be canceled due to recent stent placement and the fact that he has to be on Eliquis and Plavix for 3 months following stent placement.  He tells me he only had approximately 3 to 4 weeks  relief following the injection.  Review of Systems as detailed in HPI.  All others reviewed and are negative.   Objective: Vital Signs: There were no vitals taken for this visit.  Physical Exam well-developed well-nourished gentleman in no acute distress.  Alert and oriented x 3.  Ortho Exam right hand exam is unchanged  Specialty Comments:  No specialty comments available.  Imaging: No new imaging   PMFS History: Patient Active Problem List   Diagnosis Date Noted   Right carpal tunnel syndrome 10/28/2022   Protein-calorie malnutrition, severe 04/29/2022   Cellulitis of right lower extremity 04/28/2022   Sepsis 10/12/2021   Peripheral vascular disease    AKI    Tobacco use    COPD (chronic obstructive pulmonary disease)    BPH     Periodontal disease    IVDU, cocaine use    Demand ischemia    Thrombocytopenia    Normocytic anemia    MSSA bacteremia 10/11/2021   Callus 06/12/2021   Hav (hallux abducto valgus), unspecified laterality 06/12/2021   Hammer toes, bilateral 06/12/2021   Atherosclerosis of native arteries of extremity with intermittent claudication 06/12/2021   Hyperlipidemia 06/07/2021   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   Elevated blood-pressure reading, without diagnosis of hypertension 09/26/2020  Body mass index (BMI) 29.0-29.9, adult 08/29/2020   Partial small bowel obstruction 03/10/2020   Small bowel obstruction due to adhesions 03/08/2020   Enteritis 10/15/2019   Abnormal liver function 10/15/2019   Leukocytosis 10/15/2019   Nausea & vomiting 10/15/2019   GAD (generalized anxiety disorder) 05/18/2017   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   Constipation 04/11/2017   Bee sting 04/05/2017   Diverticulosis 11/29/2014   Healthcare maintenance 11/06/2014   Cough 10/05/2014   Insomnia 09/13/2014   Erectile dysfunction 11/30/2013   Chronic pain syndrome 11/30/2013   Depression 11/30/2013   GERD (gastroesophageal reflux disease)     Hypertension    Anxiety    Arthritis    Osteoarthritis of lumbar spine    Radiation    Night sweats 08/12/2012   Tobacco abuse counseling 05/26/2012   Lymphocytic colitis 05/03/2012   Vitamin B12 deficiency 03/19/2012   Folate deficiency 03/19/2012   Inguinal hernia 11/24/2011   Cancer 10/14/2011   Internal hemorrhoid 09/11/2011   Anal cancer 10/09/2010   Past Medical History:  Diagnosis Date   Allergy 10/2011   Anal cancer 10/14/2011   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   Peripheral vascular disease    Pneumonia    as child, cough at present with no fever   Pneumonia    as child, cough at present with no fever    Radiation 11/19/11-01/08/12   5040 cGy 28 fx Pelvis and inguinal area   Stroke    no weaknes or paralysis   Substance abuse    last use was Jan. 2024; patient states he has "been clean" since then    Family History  Problem Relation Age of Onset   Colon cancer Father    Asthma Father    Cancer Father        prostate   Hypertension Father    Asthma Mother    Hypertension Mother    Stomach cancer Neg Hx    Esophageal cancer Neg Hx     Past Surgical History:  Procedure Laterality Date   APPENDECTOMY     age 42 or 22   EXAMINATION UNDER ANESTHESIA  10/14/2011   Procedure: EXAM UNDER ANESTHESIA;  Surgeon: Earnstine Regal, MD;  [facility];  Service: General;  Laterality: N/A;  exam under anethesia, Excision of mass anal canal,  1.5cm   INGUINAL HERNIA REPAIR  05/11/2012   Procedure: HERNIA REPAIR INGUINAL ADULT;  Surgeon: Earnstine Regal, MD;  Location: [place];  Service: General;  Laterality: Left;  left inguinal hernia repair with mesh   LOWER EXTREMITY ANGIOGRAPHY Left 07/02/2021   Procedure: LOWER EXTREMITY ANGIOGRAPHY;  Surgeon: Katha Cabal, MD;  Location: [place] [facility];  Service: Cardiovascular;  Laterality: Left;   LOWER EXTREMITY ANGIOGRAPHY Right 07/16/2021   Procedure: LOWER EXTREMITY ANGIOGRAPHY;  Surgeon: Katha Cabal,  MD;  Location: [place] [facility];  Service: Cardiovascular;  Laterality: Right;   LOWER EXTREMITY ANGIOGRAPHY Left 08/27/2021   Procedure: LOWER EXTREMITY ANGIOGRAPHY;  Surgeon: Katha Cabal, MD;  Location: [place] [facility];  Service: Cardiovascular;  Laterality: Left;   LOWER EXTREMITY ANGIOGRAPHY Left 01/14/2022   Procedure: Lower Extremity Angiography;  Surgeon: Katha Cabal, MD;  Location: [place] [facility];  Service: Cardiovascular;  Laterality: Left;   LOWER EXTREMITY ANGIOGRAPHY Left 11/04/2022   Procedure: Lower Extremity Angiography;  Surgeon: Katha Cabal, MD;  Location: [place] [facility];  Service: Cardiovascular;  Laterality: Left;   SMALL INTESTINE SURGERY  04/19/2017   exploratory lap, partial small bowel resection for perforated ulcer/abcess, radiation enteritis   surgical pathology   10/14/2011   squamous cell ca of anus   TEE WITHOUT CARDIOVERSION N/A 10/16/2021   Procedure: TRANSESOPHAGEAL ECHOCARDIOGRAM (TEE);  Surgeon: [name], MD;  Location: [facility];  Service: Cardiovascular;  Laterality: N/A;   TOOTH EXTRACTION N/A 12/04/2021   Procedure: DENTAL RESTORATION/EXTRACTIONS;  Surgeon: Diona Browner, DMD;  Location: [place];  Service: Oral Surgery;  Laterality: N/A;   transanal excision  10/14/2011   Dr.Todd Gerkin   Social History   Occupational History   Occupation: unemployed  Tobacco Use    Smoking status: Every Day    Packs/day: 1.00    Years: 47.00    Additional pack years: 0.00    Total pack years: 47.00    Types: Cigarettes   Smokeless tobacco: Never   Tobacco comments:    Cutting back>> Quit Smart card supplied  Scientific laboratory technician Use: Never used  Substance and Sexual Activity   Alcohol use: Not Currently    Comment: stopped years ago   Drug use: Not Currently    Types: Cocaine, Marijuana, Methamphetamines   Sexual activity: Yes    Birth control/protection: Condom

## 2022-12-10 ENCOUNTER — Ambulatory Visit (HOSPITAL_BASED_OUTPATIENT_CLINIC_OR_DEPARTMENT_OTHER): Admission: RE | Admit: 2022-12-10 | Payer: Medicare HMO | Source: Home / Self Care | Admitting: Orthopaedic Surgery

## 2022-12-10 HISTORY — DX: Hyperlipidemia, unspecified: E78.5

## 2022-12-10 SURGERY — CARPAL TUNNEL RELEASE
Anesthesia: Monitor Anesthesia Care | Site: Wrist | Laterality: Right

## 2022-12-19 ENCOUNTER — Encounter: Payer: Medicare HMO | Admitting: Orthopaedic Surgery

## 2022-12-22 DIAGNOSIS — F1120 Opioid dependence, uncomplicated: Secondary | ICD-10-CM | POA: Diagnosis not present

## 2022-12-25 ENCOUNTER — Ambulatory Visit
Admission: EM | Admit: 2022-12-25 | Discharge: 2022-12-25 | Disposition: A | Discharge status: Home / Self Care | Payer: Medicare HMO | Attending: Family Medicine | Admitting: Family Medicine

## 2022-12-25 DIAGNOSIS — L03114 Cellulitis of left upper limb: Secondary | ICD-10-CM | POA: Diagnosis not present

## 2022-12-25 MED ORDER — AMOXICILLIN-POT CLAVULANATE 875-125 MG PO TABS: 1.0000 | ORAL_TABLET | Freq: Two times a day (BID) | ORAL | 0 refills | Status: DC

## 2022-12-25 MED ORDER — TRAMADOL HCL 50 MG PO TABS: 50.0000 mg | ORAL_TABLET | Freq: Four times a day (QID) | ORAL | 0 refills | Status: DC | PRN

## 2022-12-25 MED ORDER — MUPIROCIN 2 % EX OINT: 1.0000 | TOPICAL_OINTMENT | Freq: Two times a day (BID) | CUTANEOUS | 0 refills | Status: DC

## 2022-12-25 NOTE — Progress Notes (Signed)
Patient ID: Martin [name], male    DOB: January 01, 1961  MRN: [id number]  CC: Follow-Up  Subjective: Martin [name] is a 62 y.o. male who presents for follow-up.   His concerns today include:  - Patient seen at Urgent Care on 12/25/2022, 12/27/2022, and 12/29/2022 for left wrist pain/abscess. Today patient reports since then left wrist pain persisting. He is taking Bactrim as prescribed. Requests refills on Tramadol to assist with pain. He is keeping the left wrist cleansed and wrapped with ace bandage.  - Established with Orthopedics.  - Established with Vascular Surgery who also manages blood thinner prescription. - Needs refills on Epi Pen.  Patient Active Problem List   Diagnosis Date Noted   Right carpal tunnel syndrome 10/28/2022   Protein-calorie malnutrition, severe 04/29/2022   Cellulitis of right lower extremity 04/28/2022   Sepsis 10/12/2021   Peripheral vascular disease    AKI    Tobacco use    COPD (chronic obstructive pulmonary disease)    BPH     Periodontal disease    IVDU, cocaine use    Demand ischemia    Thrombocytopenia    Normocytic anemia    MSSA bacteremia 10/11/2021   Callus 06/12/2021   Hav (hallux abducto valgus), unspecified laterality 06/12/2021   Hammer toes, bilateral 06/12/2021   Atherosclerosis of native arteries of extremity with intermittent claudication 06/12/2021   Hyperlipidemia 06/07/2021   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   Elevated blood-pressure reading, without diagnosis of hypertension 09/26/2020   Body mass index (BMI) 29.0-29.9, adult 08/29/2020   Partial small bowel obstruction 03/10/2020   Small bowel obstruction due to adhesions 03/08/2020   Enteritis 10/15/2019   Abnormal liver function 10/15/2019   Leukocytosis 10/15/2019   Nausea & vomiting 10/15/2019   GAD (generalized anxiety disorder) 05/18/2017   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   Constipation 04/11/2017   Bee sting 04/05/2017   Diverticulosis  11/29/2014   Healthcare maintenance 11/06/2014   Cough 10/05/2014   Insomnia 09/13/2014   Erectile dysfunction 11/30/2013   Chronic pain syndrome 11/30/2013   Depression 11/30/2013   GERD (gastroesophageal reflux disease)    Hypertension    Anxiety    Arthritis    Osteoarthritis of lumbar spine    Radiation    Night sweats 08/12/2012   Tobacco abuse counseling 05/26/2012   Lymphocytic colitis 05/03/2012   Vitamin B12 deficiency 03/19/2012   Folate deficiency 03/19/2012   Inguinal hernia 11/24/2011   Cancer 10/14/2011   Internal hemorrhoid 09/11/2011   Anal cancer 10/09/2010     Current Outpatient Medications on File Prior to Visit  Medication Sig Dispense Refill   albuterol (VENTOLIN HFA) 108 (90 Base) MCG/ACT inhaler Inhale 2 puffs into the lungs every 6 (six) hours as needed for wheezing or shortness of breath. 18 g 1   apixaban (ELIQUIS) 2.5 MG TABS tablet Take 1 tablet (2.5 mg total) by mouth 2 (two) times daily. 60 tablet 6   atorvastatin (LIPITOR) 10 MG tablet Take 1 tablet (10 mg total) by mouth daily. Additional refills per primary provider 30 tablet 4   clopidogrel (PLAVIX) 75 MG tablet Take 1 tablet (75 mg total) by mouth daily. 30 tablet 11   METHADONE HCL PO Take 58 mg by mouth daily.     tamsulosin (FLOMAX) 0.4 MG CAPS capsule Take 1 capsule (0.4 mg total) by mouth at bedtime. 90 capsule 1   feeding supplement (ENSURE ENLIVE / ENSURE PLUS) LIQD Take 237 mLs by mouth 3 (three) times daily  between meals. (Patient not taking: Reported on 12/30/2022) 237 mL 12   mupirocin ointment (BACTROBAN) 2 % Apply 1 Application topically 2 (two) times daily. To affected area till better (Patient not taking: Reported on 12/30/2022) 22 g 0   sulfamethoxazole-trimethoprim (BACTRIM DS) 800-160 MG tablet Take 1 tablet by mouth 2 (two) times daily for 7 days. 14 tablet 0   [DISCONTINUED] sucralfate (CARAFATE) 1 GM/10ML suspension Take 10 mLs (1 g total) by mouth 4 (four) times daily -  with  meals and at bedtime. (Patient not taking: Reported on 11/28/2019) 420 mL 0   No current facility-administered medications on file prior to visit.    Allergies  Allergen Reactions   Aspirin Swelling    Lip swelling   Yellow Jacket Venom [Bee Venom] Anaphylaxis   Codeine Nausea Only   Ibuprofen     On blood thinners   Morphine And Related Itching    Social History   Socioeconomic History   Marital status: Widowed    Spouse name: Not on file   Number of children: 3   Years of education: Not on file   Highest education level: Not on file  Occupational History   Occupation: unemployed  Tobacco Use   Smoking status: Every Day    Packs/day: 1.00    Years: 47.00    Additional pack years: 0.00    Total pack years: 47.00    Types: Cigarettes   Smokeless tobacco: Never   Tobacco comments:    Cutting back>> Quit Smart card supplied  Building services engineer Use: Never used  Substance and Sexual Activity   Alcohol use: Not Currently    Comment: stopped years ago   Drug use: Not Currently    Types: Cocaine, Marijuana, Methamphetamines   Sexual activity: Yes    Birth control/protection: Condom  Other Topics Concern   Not on file  Social History Narrative   Not on file   Social Determinants of Health   Financial Resource Strain: Not on file  Food Insecurity: Not on file  Transportation Needs: Not on file  Physical Activity: Not on file  Stress: Not on file  Social Connections: Not on file  Intimate Partner Violence: Not on file    Family History  Problem Relation Age of Onset   Colon cancer Father    Asthma Father    Cancer Father        prostate   Hypertension Father    Asthma Mother    Hypertension Mother    Stomach cancer Neg Hx    Esophageal cancer Neg Hx     Past Surgical History:  Procedure Laterality Date   APPENDECTOMY     age 56 or 40   EXAMINATION UNDER ANESTHESIA  10/14/2011   Procedure: EXAM UNDER ANESTHESIA;  Surgeon: Velora Heckler, MD;   Location: [facility];  Service: General;  Laterality: N/A;  exam under anethesia, Excision of mass anal canal, 1.5cm   INGUINAL HERNIA REPAIR  05/11/2012   Procedure: HERNIA REPAIR INGUINAL ADULT;  Surgeon: Velora Heckler, MD;  Location: [facility];  Service: General;  Laterality: Left;  left inguinal hernia repair with mesh   LOWER EXTREMITY ANGIOGRAPHY Left 07/02/2021   Procedure: LOWER EXTREMITY ANGIOGRAPHY;  Surgeon: Renford Dills, MD;  Location: [facility];  Service: Cardiovascular;  Laterality: Left;   LOWER EXTREMITY ANGIOGRAPHY Right 07/16/2021   Procedure: LOWER EXTREMITY ANGIOGRAPHY;  Surgeon: Renford Dills, MD;  Location: [facility];  Service: Cardiovascular;  Laterality: Right;   LOWER EXTREMITY ANGIOGRAPHY Left 08/27/2021   Procedure: LOWER EXTREMITY ANGIOGRAPHY;  Surgeon: Renford Dills, MD;  Location: [facility];  Service: Cardiovascular;  Laterality: Left;   LOWER EXTREMITY ANGIOGRAPHY Left 01/14/2022   Procedure: Lower Extremity Angiography;  Surgeon: Renford Dills, MD;  Location: [facility];  Service: Cardiovascular;  Laterality: Left;   LOWER EXTREMITY ANGIOGRAPHY Left 11/04/2022   Procedure: Lower Extremity Angiography;  Surgeon: Renford Dills, MD;  Location: [facility];  Service: Cardiovascular;  Laterality: Left;   SMALL INTESTINE SURGERY  04/19/2017   exploratory lap, partial small bowel resection for perforated ulcer/abcess, radiation enteritis   surgical pathology   10/14/2011   squamous cell ca of anus   TEE WITHOUT CARDIOVERSION N/A 10/16/2021   Procedure: TRANSESOPHAGEAL ECHOCARDIOGRAM (TEE);  Surgeon: Chrystie Nose, MD;  Location: [facility];  Service: Cardiovascular;  Laterality: N/A;   TOOTH EXTRACTION N/A 12/04/2021   Procedure: DENTAL RESTORATION/EXTRACTIONS;  Surgeon: Ocie Doyne, DMD;  Location: [facility];  Service: Oral Surgery;  Laterality: N/A;   transanal excision  10/14/2011    Dr.Todd Gerkin    ROS: Review of Systems Negative except as stated above  PHYSICAL EXAM: BP (!) 175/56   Pulse 62   Temp 98.3 F (36.8 C) (Oral)   Resp 20   Ht  (1.803 m)   Wt 136 lb (61.7 kg)   SpO2 98%   BMI 18.97 kg/m   Physical Exam HENT:     Head: Normocephalic and atraumatic.  Eyes:     Extraocular Movements: Extraocular movements intact.     Conjunctiva/sclera: Conjunctivae normal.     Pupils: Pupils are equal, round, and reactive to light.  Cardiovascular:     Rate and Rhythm: Normal rate and regular rhythm.     Pulses: Normal pulses.     Heart sounds: Normal heart sounds.  Pulmonary:     Effort: Pulmonary effort is normal.     Breath sounds: Normal breath sounds.  Musculoskeletal:     Right shoulder: Normal.     Left shoulder: Normal.     Right upper arm: Normal.     Left upper arm: Normal.     Right elbow: Normal.     Left elbow: Normal.     Right forearm: Normal.     Right wrist: Normal.     Right hand: Normal.     Left hand: Normal.     Cervical back: Normal range of motion and neck supple.     Comments: Left wrist with purulent drainage from wound. No additional presentation.   Neurological:     General: No focal deficit present.     Mental Status: He is alert and oriented to person, place, and time.  Psychiatric:        Mood and Affect: Mood normal.        Behavior: Behavior normal.     ASSESSMENT AND PLAN: 1. Left wrist pain 2. Abscess of skin of left wrist 3. Visit for wound check - Continue Bactrim as prescribed.  - Continue Tramadol as prescribed. I did check the [facility] prescription drug database. - Wound change completed today in office by Guy Franco, RN.  - Referral to Wound Clinic for further evaluation/management.  - traMADol (ULTRAM) 50 MG tablet; Take 1 tablet (50 mg total) by mouth every 6 (six) hours as needed for up to 5 days.  Dispense: 20 tablet; Refill:  0 - Ambulatory referral to Wound Clinic  4.  History of anal cancer - Referral to Hematology / Oncology for further evaluation/management.  - Ambulatory referral to Hematology / Oncology  5. History of anaphylaxis - Epinephrine injection as prescribed.  - EPINEPHrine (EPIPEN 2-PAK) 0.3 mg/0.3 mL IJ SOAJ injection; Inject 0.3 mg (1 pen) into the muscle as needed for anaphylaxis.  Dispense: 2 each; Refill: 2  6. Primary hypertension 7. Hyperlipidemia, unspecified hyperlipidemia type 8. Peripheral artery disease - Keep all scheduled appointments with Vascular Surgery.  9. Right carpal tunnel syndrome 10. Chronic left shoulder pain - Keep all scheduled appointments with Orthopedics.    Patient was given the opportunity to ask questions.  Patient verbalized understanding of the plan and was able to repeat key elements of the plan. Patient was given clear instructions to go to Emergency Department or return to medical center if symptoms don't improve, worsen, or new problems develop.The patient verbalized understanding.   Orders Placed This Encounter  Procedures   Ambulatory referral to Wound Clinic   Ambulatory referral to Hematology / Oncology     Requested Prescriptions   Signed Prescriptions Disp Refills   traMADol (ULTRAM) 50 MG tablet 20 tablet 0    Sig: Take 1 tablet (50 mg total) by mouth every 6 (six) hours as needed for up to 5 days.   EPINEPHrine (EPIPEN 2-PAK) 0.3 mg/0.3 mL IJ SOAJ injection 2 each 2    Sig: Inject 0.3 mg (1 pen) into the muscle as needed for anaphylaxis.    Follow-up with primary provider as scheduled.   Rema Fendt, NP

## 2022-12-25 NOTE — ED Provider Notes (Signed)
[name]    CSN: [id number] Arrival date & time: 12/25/22  0909      History   Chief Complaint Chief Complaint  Patient presents with   Skin Infection     HPI Martin [name] is a 62 y.o. male.   HPI Here for pain and redness and swelling in his left wrist.  A few days ago when he was working in an old house he reached in to up cabinet and scratched his left wrist on the radial side.  Then yesterday it began swelling and becoming red and it has been hurting a lot.  No fever so far no drainage so far.  Last Tdap was in September 2023. He is on Eliquis and Plavix after having had a stent procedure in February of this year.   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   Peripheral vascular disease    Pneumonia    as child, cough at present with no fever   Pneumonia    as child, cough at present with no fever    Radiation 11/19/11-01/08/12   5040 cGy 28 fx Pelvis and inguinal area   Stroke    no weaknes or paralysis   Substance abuse    last use was Jan. 2024; patient states he has "been clean" since then    Patient Active Problem List   Diagnosis Date Noted   Right carpal tunnel syndrome 10/28/2022   Protein-calorie malnutrition, severe 04/29/2022   Cellulitis of right lower extremity 04/28/2022   Sepsis 10/12/2021   Peripheral vascular disease    AKI     Tobacco use    COPD (chronic obstructive pulmonary disease)    BPH     Periodontal disease    IVDU, cocaine use    Demand ischemia    Thrombocytopenia    Normocytic anemia    MSSA bacteremia 10/11/2021   Callus 06/12/2021   Hav (hallux abducto valgus), unspecified laterality 06/12/2021   Hammer toes, bilateral 06/12/2021   Atherosclerosis of native arteries of extremity with intermittent claudication 06/12/2021   Hyperlipidemia 06/07/2021   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   Elevated blood-pressure reading, without diagnosis of hypertension 09/26/2020   Body mass index (BMI) 29.0-29.9, adult 08/29/2020   Partial small bowel obstruction 03/10/2020   Small bowel obstruction due to adhesions 03/08/2020   Enteritis 10/15/2019   Abnormal liver function 10/15/2019   Leukocytosis 10/15/2019   Nausea & vomiting 10/15/2019   GAD (generalized anxiety disorder) 05/18/2017   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   Constipation 04/11/2017   Bee sting 04/05/2017   Diverticulosis 11/29/2014   Healthcare maintenance 11/06/2014   Cough 10/05/2014   Insomnia 09/13/2014   Erectile dysfunction 11/30/2013   Chronic pain syndrome 11/30/2013   Depression  11/30/2013   GERD (gastroesophageal reflux disease)    Hypertension    Anxiety    Arthritis    Osteoarthritis of lumbar spine    Radiation    Night sweats 08/12/2012   Tobacco abuse counseling 05/26/2012   Lymphocytic colitis 05/03/2012   Vitamin B12 deficiency 03/19/2012   Folate deficiency 03/19/2012   Inguinal hernia 11/24/2011   Cancer 10/14/2011   Internal hemorrhoid 09/11/2011   Anal cancer 10/09/2010    Past Surgical History:  Procedure Laterality Date   APPENDECTOMY     age 89 or 27   EXAMINATION UNDER ANESTHESIA  10/14/2011   Procedure: EXAM UNDER ANESTHESIA;  Surgeon: Velora Heckler, MD;  Location: [facility];  Service: General;  Laterality: N/A;  exam under anethesia, Excision of mass anal canal, 1.5cm    INGUINAL HERNIA REPAIR  05/11/2012   Procedure: HERNIA REPAIR INGUINAL ADULT;  Surgeon: Velora Heckler, MD;  Location: [facility];  Service: General;  Laterality: Left;  left inguinal hernia repair with mesh   LOWER EXTREMITY ANGIOGRAPHY Left 07/02/2021   Procedure: LOWER EXTREMITY ANGIOGRAPHY;  Surgeon: Renford Dills, MD;  Location: [facility];  Service: Cardiovascular;  Laterality: Left;   LOWER EXTREMITY ANGIOGRAPHY Right 07/16/2021   Procedure: LOWER EXTREMITY ANGIOGRAPHY;  Surgeon: Renford Dills, MD;  Location: [facility];  Service: Cardiovascular;  Laterality: Right;   LOWER EXTREMITY ANGIOGRAPHY Left 08/27/2021   Procedure: LOWER EXTREMITY ANGIOGRAPHY;  Surgeon: Renford Dills, MD;  Location: [facility];  Service: Cardiovascular;  Laterality: Left;   LOWER EXTREMITY ANGIOGRAPHY Left 01/14/2022   Procedure: Lower Extremity Angiography;  Surgeon: Renford Dills, MD;  Location: [facility];  Service: Cardiovascular;  Laterality: Left;   LOWER EXTREMITY ANGIOGRAPHY Left 11/04/2022   Procedure: Lower Extremity Angiography;  Surgeon: Renford Dills, MD;  Location: [facility];  Service: Cardiovascular;  Laterality: Left;   SMALL INTESTINE SURGERY  04/19/2017   exploratory lap, partial small bowel resection for perforated ulcer/abcess, radiation enteritis   surgical pathology   10/14/2011   squamous cell ca of anus   TEE WITHOUT CARDIOVERSION N/A 10/16/2021   Procedure: TRANSESOPHAGEAL ECHOCARDIOGRAM (TEE);  Surgeon: Chrystie Nose, MD;  Location: [facility];  Service: Cardiovascular;  Laterality: N/A;   TOOTH EXTRACTION N/A 12/04/2021   Procedure: DENTAL RESTORATION/EXTRACTIONS;  Surgeon: Ocie Doyne, DMD;  Location: [facility];  Service: Oral Surgery;  Laterality: N/A;   transanal excision  10/14/2011   Dr.Todd Gerkin       Home Medications    Prior to Admission medications   Medication Sig Start Date End  Date Taking? Authorizing Provider  amoxicillin-clavulanate (AUGMENTIN) 875-125 MG tablet Take 1 tablet by mouth 2 (two) times daily for 7 days. 12/25/22 01/01/23 Yes Gabryel Talamo, Janace Aris, MD  mupirocin ointment (BACTROBAN) 2 % Apply 1 Application topically 2 (two) times daily. To affected area till better 12/25/22  Yes Djon Tith, Janace Aris, MD  traMADol (ULTRAM) 50 MG tablet Take 1 tablet (50 mg total) by mouth every 6 (six) hours as needed (pain). 12/25/22  Yes Zenia Resides, MD  albuterol (VENTOLIN HFA) 108 (90 Base) MCG/ACT inhaler Inhale 2 puffs into the lungs every 6 (six) hours as needed for wheezing or shortness of breath. 07/23/22   White, Elita Boone, NP  apixaban (ELIQUIS) 2.5 MG TABS tablet Take 1 tablet (2.5 mg total) by mouth 2 (two) times daily. 01/14/22   Schnier, Latina Craver, MD  atorvastatin (  LIPITOR) 10 MG tablet Take 1 tablet (10 mg total) by mouth daily. Additional refills per primary provider 11/04/22 11/04/23  Schnier, Latina Craver, MD  clopidogrel (PLAVIX) 75 MG tablet Take 1 tablet (75 mg total) by mouth daily. 10/07/22   Georgiana Spinner, NP  COMBIVENT RESPIMAT 20-100 MCG/ACT AERS respimat Inhale 1 puff into the lungs 4 (four) times daily. 08/21/22   [provider]  econazole nitrate 1 % cream Apply topically daily. 01/17/22   Ellsworth Lennox, PA-C  EPINEPHrine (EPIPEN 2-PAK) 0.3 mg/0.3 mL IJ SOAJ injection Inject 0.3 mg (1 pen) into the muscle as needed for anaphylaxis. 05/01/22   Marguerita Merles Latif, DO  feeding supplement (ENSURE ENLIVE / ENSURE PLUS) LIQD Take 237 mLs by mouth 3 (three) times daily between meals. 05/01/22   Sheikh, Omair Latif, DO  fluticasone furoate-vilanterol (BREO ELLIPTA) 100-25 MCG/ACT AEPB Inhale 1 puff into the lungs daily. 01/22/22   Crain, Whitney L, PA  folic acid (FOLVITE) 1 MG tablet Take 1 tablet (1 mg total) by mouth daily. 05/01/22   Marguerita Merles Latif, DO  gabapentin (NEURONTIN) 100 MG capsule Take 1-3 capsules (100-300 mg total) by mouth at bedtime.  12/02/22   Zenia Resides, MD  ipratropium-albuterol (DUONEB) 0.5-2.5 (3) MG/3ML SOLN Take 3 mLs by nebulization every 6 (six) hours as needed. 05/27/22   Mound, Acie Fredrickson, FNP  METHADONE HCL PO Take 85 mg by mouth daily.    [provider]  Multiple Vitamin (MULTIVITAMIN WITH MINERALS) TABS tablet Take 1 tablet by mouth daily. 05/01/22   Sheikh, Kateri Mc Latif, DO  nicotine (NICODERM CQ - DOSED IN MG/24 HOURS) 21 mg/24hr patch Place 1 patch (21 mg total) onto the skin daily. 05/01/22   Sheikh, Omair Latif, DO  senna-docusate (SENOKOT-S) 8.6-50 MG tablet Take 1 tablet by mouth at bedtime as needed for mild constipation. 05/01/22   Marguerita Merles Latif, DO  tamsulosin (FLOMAX) 0.4 MG CAPS capsule Take 1 capsule (0.4 mg total) by mouth at bedtime. 09/16/22   Georganna Skeans, MD  tiZANidine (ZANAFLEX) 4 MG tablet Take 1 tablet (4 mg total) by mouth every 6 (six) hours as needed for muscle spasms. 09/22/22   Ellsworth Lennox, PA-C  sucralfate (CARAFATE) 1 GM/10ML suspension Take 10 mLs (1 g total) by mouth 4 (four) times daily -  with meals and at bedtime. Patient not taking: Reported on 11/28/2019 10/21/19 06/13/20  Nicanor Alcon, April, MD    Family History Family History  Problem Relation Age of Onset   Colon cancer Father    Asthma Father    Cancer Father        prostate   Hypertension Father    Asthma Mother    Hypertension Mother    Stomach cancer Neg Hx    Esophageal cancer Neg Hx     Social History Social History   Tobacco Use   Smoking status: Every Day    Packs/day: 1.00    Years: 47.00    Additional pack years: 0.00    Total pack years: 47.00    Types: Cigarettes   Smokeless tobacco: Never   Tobacco comments:    Cutting back>> Quit Smart card supplied  Building services engineer Use: Never used  Substance Use Topics   Alcohol use: Not Currently    Comment: stopped years ago   Drug use: Not Currently    Types: Cocaine, Marijuana, Methamphetamines     Allergies   Aspirin, Yellow  jacket venom [bee venom], Codeine, Ibuprofen, and  Morphine and related   Review of Systems Review of Systems   Physical Exam Triage Vital Signs ED Triage Vitals [12/25/22 1031]  Enc Vitals Group     BP (!) 173/62     Pulse Rate 93     Resp      Temp 97.9 F (36.6 C)     Temp Source Oral     SpO2 98 %     Weight      Height      Head Circumference      Peak Flow      Pain Score 6     Pain Loc      Pain Edu?      Excl. in GC?    No data found.  Updated Vital Signs BP (!) 173/62 (BP Location: Left Arm)   Pulse 93   Temp 97.9 F (36.6 C) (Oral)   SpO2 98%   Visual Acuity Right Eye Distance:   Left Eye Distance:   Bilateral Distance:    Right Eye Near:   Left Eye Near:    Bilateral Near:     Physical Exam Vitals reviewed.  Constitutional:      Appearance: He is not ill-appearing, toxic-appearing or diaphoretic.     Comments: In obvious pain  Skin:    Coloration: Skin is not pale.     Comments: There are 2 abrasions on his left wrist on the dorsal and radial aspect.  They are each about half a centimeter in diameter.  There is surrounding erythema and induration about 5 cm in diameter with some puffiness of the rest of the wrist noted.  Neurological:     General: No focal deficit present.     Mental Status: He is alert and oriented to person, place, and time.  Psychiatric:        Behavior: Behavior normal.      UC Treatments / Results  Labs (all labs ordered are listed, but only abnormal results are displayed) Labs Reviewed - No data to display  EKG   Radiology No results found.  Procedures Procedures (including critical care time)  Medications Ordered in UC Medications - No data to display  Initial Impression / Assessment and Plan / UC Course  I have reviewed the triage vital signs and the nursing notes.  Pertinent labs & imaging results that were available during my care of the patient were reviewed by me and considered in my medical  decision making (see chart for details).     He is up-to-date on his tetanus, so he does not need that booster.  Augmentin and mupirocin ointment are sent into the pharmacy to treat the wound infection.  Tramadol is sent in for pain. Final Clinical Impressions(s) / UC Diagnoses   Final diagnoses:  Cellulitis of left upper extremity     Discharge Instructions      Take amoxicillin-clavulanate 875 mg--1 tab twice daily with food for 7 days  Put mupirocin ointment on the sore areas twice daily until improved  Take tramadol 50 mg-- 1 tablet every 6 hours as needed for pain.  This medication can make you sleepy or dizzy  You are correct--Your last tetanus booster was in September 2023, so you do not need a tetanus booster today.      ED Prescriptions     Medication Sig Dispense Auth. Provider   traMADol (ULTRAM) 50 MG tablet Take 1 tablet (50 mg total) by mouth every 6 (six) hours as needed (pain). 6  tablet Zenia Resides, MD   amoxicillin-clavulanate (AUGMENTIN) 875-125 MG tablet Take 1 tablet by mouth 2 (two) times daily for 7 days. 14 tablet Tahjae Durr, Janace Aris, MD   mupirocin ointment (BACTROBAN) 2 % Apply 1 Application topically 2 (two) times daily. To affected area till better 22 g Joscelyn Hardrick, Janace Aris, MD      I have reviewed the PDMP during this encounter.   Zenia Resides, MD 12/25/22 1048

## 2022-12-25 NOTE — ED Triage Notes (Signed)
Pt presents with infection to left wrist from a scratch while working; pt has visible swelling and redness & states he has a lot of pain since yesterday.

## 2022-12-25 NOTE — Discharge Instructions (Signed)
Take amoxicillin-clavulanate 875 mg--1 tab twice daily with food for 7 days  Put mupirocin ointment on the sore areas twice daily until improved  Take tramadol 50 mg-- 1 tablet every 6 hours as needed for pain.  This medication can make you sleepy or dizzy  You are correct--Your last tetanus booster was in September 2023, so you do not need a tetanus booster today.

## 2022-12-27 ENCOUNTER — Encounter (HOSPITAL_COMMUNITY): Payer: Self-pay

## 2022-12-27 ENCOUNTER — Ambulatory Visit (HOSPITAL_COMMUNITY)
Admission: EM | Admit: 2022-12-27 | Discharge: 2022-12-27 | Disposition: A | Discharge status: Home / Self Care | Payer: Medicare HMO | Attending: Emergency Medicine | Admitting: Emergency Medicine

## 2022-12-27 DIAGNOSIS — L089 Local infection of the skin and subcutaneous tissue, unspecified: Secondary | ICD-10-CM

## 2022-12-27 DIAGNOSIS — B9689 Other specified bacterial agents as the cause of diseases classified elsewhere: Secondary | ICD-10-CM | POA: Diagnosis not present

## 2022-12-27 MED ORDER — METHYLPREDNISOLONE SODIUM SUCC 125 MG IJ SOLR
60.0000 mg | Freq: Once | INTRAMUSCULAR | Status: AC
  Administered 2022-12-27: 60 mg via INTRAMUSCULAR

## 2022-12-27 MED ORDER — TRAMADOL HCL 50 MG PO TABS: 50.0000 mg | ORAL_TABLET | Freq: Every evening | ORAL | 0 refills | Status: DC

## 2022-12-27 MED ORDER — METHYLPREDNISOLONE SODIUM SUCC 125 MG IJ SOLR
INTRAMUSCULAR | Status: AC
  Filled 2022-12-27: qty 2

## 2022-12-27 NOTE — Discharge Instructions (Addendum)
Continue taking the Augmentin twice daily for the full 7 days.  It has only been 2 days so far, I recommend to give it at least another 24 hours to really start working.  Continue using the mupirocin ointment 2-3 times daily. Keep the wound clean and watch for any increasing redness or swelling. If symptoms worsen please go to the emergency department.   The steroid injection given today should provide pain relief for both the infection and your carpal tunnel symptoms.  I have sent 3 more tablets of the tramadol. Please take only at nighttime as needed. Be advised this is a narcotic medication and will make you drowsy. Do not drink alcohol or drive while taking. Please be cautions of falling.

## 2022-12-27 NOTE — ED Triage Notes (Signed)
Pt states that he has a wound on his left arm. X4 days ago.  Wrist pain on right hand x3 months. Was supposed to have surgery but couldn't because of blood thinners.   Taking ABX for infection.   pt states that he's out of tramadol/ Doesn't help anyway

## 2022-12-27 NOTE — ED Provider Notes (Signed)
[facility]    CSN: [id number] Arrival date & time: 12/27/22  1146      History   Chief Complaint Chief Complaint  Patient presents with   Wrist Pain   Wound Infection    HPI Martin [name] is a 62 y.o. male.  Here for wound check Was seen 2 days ago after scratching left wrist. Had area of infection noted on exam, prescribed Augmentin and mupirocin. He has so far taken 4 doses of the abx (2 full days). He was worried the area was not improving. Has been using the mupirocin TID  On plavix and eliquis post stent   Also right wrist pain from carpel tunnel. Has been following with ortho for this. Cannot have surgery until may due to blood thinner use. 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   Peripheral vascular disease    Pneumonia    as child, cough at present with no fever   Pneumonia    as child, cough at present with no fever    Radiation 11/19/11-01/08/12   5040 cGy 28 fx Pelvis and inguinal area   Stroke    no weaknes or paralysis   Substance abuse    last use was Jan. 2024; patient states he has "been clean" since then    Patient Active Problem List   Diagnosis Date Noted   Right carpal tunnel syndrome 10/28/2022   Protein-calorie malnutrition, severe 04/29/2022   Cellulitis of right lower extremity  04/28/2022   Sepsis 10/12/2021   Peripheral vascular disease    AKI    Tobacco use    COPD (chronic obstructive pulmonary disease)    BPH     Periodontal disease    IVDU, cocaine use    Demand ischemia    Thrombocytopenia    Normocytic anemia    MSSA bacteremia 10/11/2021   Callus 06/12/2021   Hav (hallux abducto valgus), unspecified laterality 06/12/2021   Hammer toes, bilateral 06/12/2021   Atherosclerosis of native arteries of extremity with intermittent claudication 06/12/2021   Hyperlipidemia 06/07/2021   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   Elevated blood-pressure reading, without diagnosis of hypertension 09/26/2020   Body mass index (BMI) 29.0-29.9, adult 08/29/2020   Partial small bowel obstruction 03/10/2020   Small bowel obstruction due to adhesions 03/08/2020   Enteritis 10/15/2019   Abnormal liver function 10/15/2019   Leukocytosis 10/15/2019   Nausea & vomiting 10/15/2019   GAD (generalized anxiety disorder) 05/18/2017   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   Constipation 04/11/2017   Bee sting 04/05/2017   Diverticulosis 11/29/2014   Healthcare maintenance 11/06/2014   Cough 10/05/2014   Insomnia 09/13/2014   Erectile dysfunction 11/30/2013  Chronic pain syndrome 11/30/2013   Depression 11/30/2013   GERD (gastroesophageal reflux disease)    Hypertension    Anxiety    Arthritis    Osteoarthritis of lumbar spine    Radiation    Night sweats 08/12/2012   Tobacco abuse counseling 05/26/2012   Lymphocytic colitis 05/03/2012   Vitamin B12 deficiency 03/19/2012   Folate deficiency 03/19/2012   Inguinal hernia 11/24/2011   Cancer 10/14/2011   Internal hemorrhoid 09/11/2011   Anal cancer 10/09/2010    Past Surgical History:  Procedure Laterality Date   APPEN[age]CTOMY     age 42 or 42   EXAMINATION UNDER ANESTHESIA  10/14/2011   Procedure: EXAM UNDER ANESTHESIA;  Surgeon: Velora Heckler, MD;  Location: [facility];  Service: General;  Laterality:  N/A;  exam under anethesia, Excision of mass anal canal, 1.5cm   INGUINAL HERNIA REPAIR  05/11/2012   Procedure: HERNIA REPAIR INGUINAL ADULT;  Surgeon: Velora Heckler, MD;  Location: [facility];  Service: General;  Laterality: Left;  left inguinal hernia repair with mesh   LOWER EXTREMITY ANGIOGRAPHY Left 07/02/2021   Procedure: LOWER EXTREMITY ANGIOGRAPHY;  Surgeon: Renford Dills, MD;  Location: [facility];  Service: Cardiovascular;  Laterality: Left;   LOWER EXTREMITY ANGIOGRAPHY Right 07/16/2021   Procedure: LOWER EXTREMITY ANGIOGRAPHY;  Surgeon: Renford Dills, MD;  Location: [facility];  Service: Cardiovascular;  Laterality: Right;   LOWER EXTREMITY ANGIOGRAPHY Left 08/27/2021   Procedure: LOWER EXTREMITY ANGIOGRAPHY;  Surgeon: Renford Dills, MD;  Location: [facility];  Service: Cardiovascular;  Laterality: Left;   LOWER EXTREMITY ANGIOGRAPHY Left 01/14/2022   Procedure: Lower Extremity Angiography;  Surgeon: Renford Dills, MD;  Location: [facility];  Service: Cardiovascular;  Laterality: Left;   LOWER EXTREMITY ANGIOGRAPHY Left 11/04/2022   Procedure: Lower Extremity Angiography;  Surgeon: Renford Dills, MD;  Location: [facility];  Service: Cardiovascular;  Laterality: Left;   SMALL INTESTINE SURGERY  04/19/2017   exploratory lap, partial small bowel resection for perforated ulcer/abcess, radiation enteritis   surgical pathology   10/14/2011   squamous cell ca of anus   TEE WITHOUT CARDIOVERSION N/A 10/16/2021   Procedure: TRANSESOPHAGEAL ECHOCARDIOGRAM (TEE);  Surgeon: Chrystie Nose, MD;  Location: [facility];  Service: Cardiovascular;  Laterality: N/A;   TOOTH EXTRACTION N/A 12/04/2021   Procedure: DENTAL RESTORATION/EXTRACTIONS;  Surgeon: Ocie Doyne, DMD;  Location: [facility];  Service: Oral Surgery;  Laterality: N/A;   transanal excision  10/14/2011   Dr.Todd Gerkin       Home Medications     Prior to Admission medications   Medication Sig Start Date End Date Taking? Authorizing Provider  albuterol (VENTOLIN HFA) 108 (90 Base) MCG/ACT inhaler Inhale 2 puffs into the lungs every 6 (six) hours as needed for wheezing or shortness of breath. 07/23/22  Yes White, Elita Boone, NP  amoxicillin-clavulanate (AUGMENTIN) 875-125 MG tablet Take 1 tablet by mouth 2 (two) times daily for 7 days. 12/25/22 01/01/23 Yes Banister, Janace Aris, MD  apixaban (ELIQUIS) 2.5 MG TABS tablet Take 1 tablet (2.5 mg total) by mouth 2 (two) times daily. 01/14/22  Yes Schnier, Latina Craver, MD  atorvastatin (LIPITOR) 10 MG tablet Take 1 tablet (10 mg total) by mouth daily. Additional refills per primary provider 11/04/22 11/04/23 Yes Schnier, Latina Craver, MD  clopidogrel (PLAVIX) 75 MG tablet Take 1 tablet (75 mg total) by mouth daily. 10/07/22  Yes Georgiana Spinner, NP  EPINEPHrine (EPIPEN 2-PAK) 0.3 mg/0.3 mL IJ SOAJ injection Inject 0.3 mg (1 pen) into the muscle as needed for anaphylaxis. 05/01/22  Yes Sheikh, Omair Latif, DO  feeding supplement (ENSURE ENLIVE / ENSURE PLUS) LIQD Take 237 mLs by mouth 3 (three) times daily between meals. 05/01/22  Yes Sheikh, Omair Latif, DO  folic acid (FOLVITE) 1 MG tablet Take 1 tablet (1 mg total) by mouth daily. 05/01/22  Yes Sheikh, Omair Latif, DO  gabapentin (NEURONTIN) 100 MG capsule Take 1-3 capsules (100-300 mg total) by mouth at bedtime. 12/02/22  Yes Zenia Resides, MD  METHADONE HCL PO Take 58 mg by mouth daily.   Yes [provider]  mupirocin ointment (BACTROBAN) 2 % Apply 1 Application topically 2 (two) times daily. To affected area till better Patient taking differently: Apply 1 Application topically 3 (three) times daily. To affected area till better 12/25/22  Yes Banister, Janace Aris, MD  tamsulosin (FLOMAX) 0.4 MG CAPS capsule Take 1 capsule (0.4 mg total) by mouth at bedtime. 09/16/22  Yes Georganna Skeans, MD  traMADol (ULTRAM) 50 MG tablet Take 1 tablet (50 mg total)  by mouth at bedtime. 12/27/22  Yes Holley Wirt, Lurena Joiner, PA-C  sucralfate (CARAFATE) 1 GM/10ML suspension Take 10 mLs (1 g total) by mouth 4 (four) times daily -  with meals and at bedtime. Patient not taking: Reported on 11/28/2019 10/21/19 06/13/20  Nicanor Alcon, April, MD    Family History Family History  Problem Relation Age of Onset   Colon cancer Father    Asthma Father    Cancer Father        prostate   Hypertension Father    Asthma Mother    Hypertension Mother    Stomach cancer Neg Hx    Esophageal cancer Neg Hx     Social History Social History   Tobacco Use   Smoking status: Every Day    Packs/day: 1.00    Years: 47.00    Additional pack years: 0.00    Total pack years: 47.00    Types: Cigarettes   Smokeless tobacco: Never   Tobacco comments:    Cutting back>> Quit Smart card supplied  Vaping Use   Vaping Use: Never used  Substance Use Topics   Alcohol use: Not Currently    Comment: stopped years ago   Drug use: Not Currently    Types: Cocaine, Marijuana, Methamphetamines     Allergies   Aspirin, Yellow jacket venom [bee venom], Codeine, Ibuprofen, and Morphine and related   Review of Systems Review of Systems As per HPI  Physical Exam Triage Vital Signs ED Triage Vitals  Enc Vitals Group     BP 12/27/22 1246 (!) 170/61     Pulse Rate 12/27/22 1246 69     Resp 12/27/22 1246 18     Temp 12/27/22 1246 98.2 F (36.8 C)     Temp Source 12/27/22 1246 Oral     SpO2 12/27/22 1246 97 %     Weight 12/27/22 1243 140 lb (63.5 kg)     Height --      Head Circumference --      Peak Flow --      Pain Score 12/27/22 1242 9     Pain Loc --      Pain Edu? --      Excl. in GC? --    No data found.  Updated Vital Signs BP (!) 175/55 (BP Location: Right Arm)   Pulse 72   Temp 98.2  F (36.8 C) (Oral)   Resp 18   Wt 140 lb (63.5 kg)   SpO2 96%   BMI 19.53 kg/m   Physical Exam Vitals and nursing note reviewed.  Constitutional:      General: He is not in  acute distress.    Appearance: Normal appearance.  HENT:     Mouth/Throat:     Pharynx: Oropharynx is clear.  Cardiovascular:     Rate and Rhythm: Normal rate and regular rhythm.     Pulses: Normal pulses.     Heart sounds: Normal heart sounds.  Pulmonary:     Effort: Pulmonary effort is normal.     Breath sounds: Normal breath sounds.  Musculoskeletal:        General: No deformity.     Comments: Brace in place on right wrist   Skin:    Capillary Refill: Capillary refill takes less than 2 seconds.     Findings: Abscess and erythema present.     Comments: Left lateral wrist with area of infection. Central fluctuance with scab, surrounding erythema. No streaking. Limited wrist ROM from pain. Cap refill < 2 seconds, strong radial pulse. Sensation and grip strength intact  Neurological:     Mental Status: He is alert and oriented to person, place, and time.     UC Treatments / Results  Labs (all labs ordered are listed, but only abnormal results are displayed) Labs Reviewed - No data to display  EKG  Radiology No results found.  Procedures Procedures   Medications Ordered in UC Medications  methylPREDNISolone sodium succinate (SOLU-MEDROL) 125 mg/2 mL injection 60 mg (60 mg Intramuscular Given 12/27/22 1311)    Initial Impression / Assessment and Plan / UC Course  I have reviewed the triage vital signs and the nursing notes.  Pertinent labs & imaging results that were available during my care of the patient were reviewed by me and considered in my medical decision making (see chart for details).  Skin infection left wrist Has only taken 2 days of the antibiotic. Advised to give it another 24 hours and monitor for improvement. Will continue the mupirocin as well. Pain management difficult given his use of blood thinners and history of substance use. Cannot take NSAIDs and recently finished a 21 day prednisone taper. He finished the 6 tabs tramadol that were sent at initial  visit. I have sent 3 more pills only to be taken at bedtime. Precautions discussed. Solumedrol IM given for temporary pain control of both the infection and his carpal tunnel. Discussed once the antibiotic kicks in should provide additional pain relief. Recommend he follow-up with his orthopedic surgeon regarding his carpal tunnel.  Will monitor for any changes of infection and return to urgent care or see emergency department.  Final Clinical Impressions(s) / UC Diagnoses   Final diagnoses:  Localized bacterial skin infection     Discharge Instructions      Continue taking the Augmentin twice daily for the full 7 days.  It has only been 2 days so far, I recommend to give it at least another 24 hours to really start working.  Continue using the mupirocin ointment 2-3 times daily. Keep the wound clean and watch for any increasing redness or swelling. If symptoms worsen please go to the emergency department.   The steroid injection given today should provide pain relief for both the infection and your carpal tunnel symptoms.  I have sent 3 more tablets of the tramadol. Please take only at nighttime  as needed. Be advised this is a narcotic medication and will make you drowsy. Do not drink alcohol or drive while taking. Please be cautions of falling.     ED Prescriptions     Medication Sig Dispense Auth. Provider   traMADol (ULTRAM) 50 MG tablet Take 1 tablet (50 mg total) by mouth at bedtime. 3 tablet Martin [name], Lurena Joiner, PA-C      I have reviewed the PDMP during this encounter.   Sidra Oldfield, Lurena Joiner, [place] 12/27/22 1406

## 2022-12-29 ENCOUNTER — Encounter (HOSPITAL_COMMUNITY): Payer: Self-pay | Admitting: Emergency Medicine

## 2022-12-29 ENCOUNTER — Ambulatory Visit (HOSPITAL_COMMUNITY)
Admission: EM | Admit: 2022-12-29 | Discharge: 2022-12-29 | Disposition: A | Discharge status: Home / Self Care | Payer: Medicare HMO | Attending: Internal Medicine | Admitting: Internal Medicine

## 2022-12-29 DIAGNOSIS — L02414 Cutaneous abscess of left upper limb: Secondary | ICD-10-CM | POA: Diagnosis not present

## 2022-12-29 DIAGNOSIS — Z87898 Personal history of other specified conditions: Secondary | ICD-10-CM

## 2022-12-29 DIAGNOSIS — Z5189 Encounter for other specified aftercare: Secondary | ICD-10-CM

## 2022-12-29 DIAGNOSIS — M25532 Pain in left wrist: Secondary | ICD-10-CM

## 2022-12-29 MED ORDER — SULFAMETHOXAZOLE-TRIMETHOPRIM 800-160 MG PO TABS: 1.0000 | ORAL_TABLET | Freq: Two times a day (BID) | ORAL | 0 refills | Status: AC

## 2022-12-29 NOTE — ED Triage Notes (Signed)
Pt reports that has been seen here couple times for wound on left wrist. Reports was only given antibiotics. Concerned about infection getting worse. Also having pain that is not being treated.

## 2022-12-29 NOTE — ED Provider Notes (Signed)
[facility]    CSN: [id number] Arrival date & time: 12/29/22  1715      History   Chief Complaint Chief Complaint  Patient presents with   Wound Check    HPI Martin [name] is a 62 y.o. male.   Patient with history of HTN, anal cancer, COPD, polysubstance abuse (states he's on methadone 58mg  daily), stroke, and thrombocytopenia presents to urgent care for evaluation of pain and abscess to the left wrist that started approximately 6-7 days ago. He was reaching into a cabinet while working on an old house and believes he may have scraped the radial aspect of the left wrist causing wound. Was seen on December 25, 2022 for developing wound a few days after injury where he was prescribed Augmentin antibiotic, mupirocin ointment, and tramadol 6 pills as needed for pain. He was then seen again 2 days later on December 27, 2022 where he was given 3 more tramadol pills for pain for use at bedtime and solumedrol injection to reduce inflammation and pain related to infection of the left wrist. Symptoms have worsened significantly in the last 2 days and pain has become intolerable. He is not a candidate for NSAID as he takes plavix and eloquis (stent February 2024). Has already taken all 3 tramadol tablets and states this "doesn't work for his pain". Requesting something stronger like hydrocodone. States his psychiatrist at [facility] prescribes methadone (unable to find this on chart review or PDMP review). Denies recent illicit drug use and injection to site of abscess. Pain to the left wrist is a 9 on a scale of 0-10 currently. Denies allergies to antibiotics.      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   Peripheral vascular disease    Pneumonia    as child, cough at present with no fever   Pneumonia    as child, cough at present with no fever    Radiation 11/19/11-01/08/12   5040 cGy 28 fx Pelvis and inguinal area   Stroke    no weaknes or paralysis   Substance abuse    last use was Jan. 2024; patient states he has "been clean" since then    Patient Active Problem List   Diagnosis Date Noted   Right carpal tunnel syndrome 10/28/2022   Protein-calorie malnutrition, severe 04/29/2022   Cellulitis of right lower extremity 04/28/2022   Sepsis 10/12/2021   Peripheral vascular disease    AKI    Tobacco use    COPD (chronic obstructive pulmonary disease)    BPH     Periodontal disease    IVDU, cocaine use    Demand ischemia    Thrombocytopenia    Normocytic anemia    MSSA bacteremia 10/11/2021  Callus 06/12/2021   Hav (hallux abducto valgus), unspecified laterality 06/12/2021   Hammer toes, bilateral 06/12/2021   Atherosclerosis of native arteries of extremity with intermittent claudication 06/12/2021   Hyperlipidemia 06/07/2021   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   Elevated blood-pressure reading, without diagnosis of hypertension 09/26/2020   Body mass index (BMI) 29.0-29.9, adult 08/29/2020   Partial small bowel obstruction 03/10/2020   Small bowel obstruction due to adhesions 03/08/2020   Enteritis 10/15/2019   Abnormal liver function 10/15/2019   Leukocytosis 10/15/2019   Nausea & vomiting 10/15/2019   GAD (generalized anxiety disorder) 05/18/2017   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   Constipation 04/11/2017   Bee  sting 04/05/2017   Diverticulosis 11/29/2014   Healthcare maintenance 11/06/2014   Cough 10/05/2014   Insomnia 09/13/2014   Erectile dysfunction 11/30/2013   Chronic pain syndrome 11/30/2013   Depression 11/30/2013   GERD (gastroesophageal reflux disease)    Hypertension    Anxiety    Arthritis    Osteoarthritis of lumbar spine    Radiation    Night sweats 08/12/2012   Tobacco abuse counseling 05/26/2012   Lymphocytic colitis 05/03/2012   Vitamin B12 deficiency 03/19/2012   Folate deficiency 03/19/2012   Inguinal hernia 11/24/2011   Cancer 10/14/2011   Internal hemorrhoid 09/11/2011   Anal cancer 10/09/2010    Past Surgical History:  Procedure Laterality Date   APPENDECTOMY     age 62 or 57   EXAMINATION UNDER ANESTHESIA  10/14/2011   Procedure: EXAM UNDER ANESTHESIA;  Surgeon: Velora Heckler, MD;  Location: [facility];  Service: General;  Laterality: N/A;  exam under anethesia, Excision of mass anal canal, 1.5cm   INGUINAL HERNIA REPAIR  05/11/2012   Procedure: HERNIA REPAIR INGUINAL ADULT;  Surgeon: Velora Heckler, MD;  Location: [facility];  Service: General;  Laterality: Left;  left inguinal hernia repair with mesh   LOWER EXTREMITY ANGIOGRAPHY Left 07/02/2021   Procedure: LOWER EXTREMITY ANGIOGRAPHY;  Surgeon: Renford Dills, MD;  Location: [facility];  Service: Cardiovascular;  Laterality: Left;   LOWER EXTREMITY ANGIOGRAPHY Right 07/16/2021   Procedure: LOWER EXTREMITY ANGIOGRAPHY;  Surgeon: Renford Dills, MD;  Location: [facility];  Service: Cardiovascular;  Laterality: Right;   LOWER EXTREMITY ANGIOGRAPHY Left 08/27/2021   Procedure: LOWER EXTREMITY ANGIOGRAPHY;  Surgeon: Renford Dills, MD;  Location: [facility];  Service: Cardiovascular;  Laterality: Left;   LOWER EXTREMITY ANGIOGRAPHY Left 01/14/2022   Procedure: Lower Extremity Angiography;  Surgeon: Renford Dills, MD;  Location: [facility];   Service: Cardiovascular;  Laterality: Left;   LOWER EXTREMITY ANGIOGRAPHY Left 11/04/2022   Procedure: Lower Extremity Angiography;  Surgeon: Renford Dills, MD;  Location: [facility];  Service: Cardiovascular;  Laterality: Left;   SMALL INTESTINE SURGERY  04/19/2017   exploratory lap, partial small bowel resection for perforated ulcer/abcess, radiation enteritis   surgical pathology   10/14/2011   squamous cell ca of anus   TEE WITHOUT CARDIOVERSION N/A 10/16/2021   Procedure: TRANSESOPHAGEAL ECHOCARDIOGRAM (TEE);  Surgeon: Chrystie Nose, MD;  Location: [facility];  Service: Cardiovascular;  Laterality: N/A;   TOOTH EXTRACTION N/A 12/04/2021   Procedure: DENTAL RESTORATION/EXTRACTIONS;  Surgeon: Ocie Doyne, DMD;  Location: [facility];  Service: Oral Surgery;  Laterality: N/A;   transanal excision  10/14/2011   Dr.Todd Gerkin       Home Medications    Prior to Admission medications   Medication Sig  Start Date End Date Taking? Authorizing Provider  sulfamethoxazole-trimethoprim (BACTRIM DS) 800-160 MG tablet Take 1 tablet by mouth 2 (two) times daily for 7 days. 12/29/22 01/05/23 Yes Carlisle Beers, FNP  albuterol (VENTOLIN HFA) 108 (90 Base) MCG/ACT inhaler Inhale 2 puffs into the lungs every 6 (six) hours as needed for wheezing or shortness of breath. 07/23/22   White, Elita Boone, NP  apixaban (ELIQUIS) 2.5 MG TABS tablet Take 1 tablet (2.5 mg total) by mouth 2 (two) times daily. 01/14/22   Schnier, Latina Craver, MD  atorvastatin (LIPITOR) 10 MG tablet Take 1 tablet (10 mg total) by mouth daily. Additional refills per primary provider 11/04/22 11/04/23  Schnier, Latina Craver, MD  clopidogrel (PLAVIX) 75 MG tablet Take 1 tablet (75 mg total) by mouth daily. 10/07/22   Georgiana Spinner, NP  EPINEPHrine (EPIPEN 2-PAK) 0.3 mg/0.3 mL IJ SOAJ injection Inject 0.3 mg (1 pen) into the muscle as needed for anaphylaxis. 12/30/22   Rema Fendt, NP  feeding supplement (ENSURE ENLIVE /  ENSURE PLUS) LIQD Take 237 mLs by mouth 3 (three) times daily between meals. Patient not taking: Reported on 12/30/2022 05/01/22   Marguerita Merles Latif, DO  METHADONE HCL PO Take 58 mg by mouth daily.    [provider]  mupirocin ointment (BACTROBAN) 2 % Apply 1 Application topically 2 (two) times daily. To affected area till better Patient not taking: Reported on 12/30/2022 12/25/22   Zenia Resides, MD  tamsulosin (FLOMAX) 0.4 MG CAPS capsule Take 1 capsule (0.4 mg total) by mouth at bedtime. 09/16/22   Georganna Skeans, MD  traMADol (ULTRAM) 50 MG tablet Take 1 tablet (50 mg total) by mouth every 6 (six) hours as needed for up to 5 days. 12/30/22 01/04/23  Rema Fendt, NP  sucralfate (CARAFATE) 1 GM/10ML suspension Take 10 mLs (1 g total) by mouth 4 (four) times daily -  with meals and at bedtime. Patient not taking: Reported on 11/28/2019 10/21/19 06/13/20  Nicanor Alcon, April, MD    Family History Family History  Problem Relation Age of Onset   Colon cancer Father    Asthma Father    Cancer Father        prostate   Hypertension Father    Asthma Mother    Hypertension Mother    Stomach cancer Neg Hx    Esophageal cancer Neg Hx     Social History Social History   Tobacco Use   Smoking status: Every Day    Packs/day: 1.00    Years: 47.00    Additional pack years: 0.00    Total pack years: 47.00    Types: Cigarettes   Smokeless tobacco: Never   Tobacco comments:    Cutting back>> Quit Smart card supplied  Vaping Use   Vaping Use: Never used  Substance Use Topics   Alcohol use: Not Currently    Comment: stopped years ago   Drug use: Not Currently    Types: Cocaine, Marijuana, Methamphetamines     Allergies   Aspirin, Yellow jacket venom [bee venom], Codeine, Ibuprofen, and Morphine and related   Review of Systems Review of Systems Per HPI  Physical Exam Triage Vital Signs ED Triage Vitals  Enc Vitals Group     BP 12/29/22 1845 (!) 157/66     Pulse Rate  12/29/22 1845 (!) 58     Resp 12/29/22 1845 18     Temp 12/29/22 1845 (!) 97.5 F (36.4 C)     Temp  Source 12/29/22 1845 Oral     SpO2 12/29/22 1845 98 %     Weight --      Height --      Head Circumference --      Peak Flow --      Pain Score 12/29/22 1844 9     Pain Loc --      Pain Edu? --      Excl. in GC? --    No data found.  Updated Vital Signs BP (!) 157/66 (BP Location: Right Arm)   Pulse (!) 58   Temp (!) 97.5 F (36.4 C) (Oral)   Resp 18   SpO2 98%   Visual Acuity Right Eye Distance:   Left Eye Distance:   Bilateral Distance:    Right Eye Near:   Left Eye Near:    Bilateral Near:     Physical Exam Vitals and nursing note reviewed.  Constitutional:      Appearance: He is not ill-appearing or toxic-appearing.  HENT:     Head: Normocephalic and atraumatic.     Right Ear: Hearing and external ear normal.     Left Ear: Hearing and external ear normal.     Nose: Nose normal.     Mouth/Throat:     Lips: Pink.     Mouth: Mucous membranes are moist. No injury.     Tongue: No lesions. Tongue does not deviate from midline.     Palate: No mass and lesions.     Pharynx: Oropharynx is clear. Uvula midline. No pharyngeal swelling, oropharyngeal exudate, posterior oropharyngeal erythema or uvula swelling.     Tonsils: No tonsillar exudate or tonsillar abscesses.  Eyes:     General: Lids are normal. Vision grossly intact. Gaze aligned appropriately.     Extraocular Movements: Extraocular movements intact.     Conjunctiva/sclera: Conjunctivae normal.  Cardiovascular:     Rate and Rhythm: Normal rate and regular rhythm.     Heart sounds: Normal heart sounds, S1 normal and S2 normal.  Pulmonary:     Effort: Pulmonary effort is normal. No respiratory distress.     Breath sounds: Normal breath sounds and air entry.  Musculoskeletal:     Left wrist: Swelling (abscess) and tenderness present. No deformity, effusion, bony tenderness, snuff box tenderness or crepitus.  Decreased range of motion (secondary to abscess to the radial aspect of the left wrist). Normal pulse (+2 left radial pulse, less than 2 cap refill, 5/5 grip strength, sensation intact distally).     Cervical back: Neck supple.     Comments: Brace present to the right wrist (carpal tunnel).  Skin:    General: Skin is warm and dry.     Capillary Refill: Capillary refill takes less than 2 seconds.     Findings: Abscess, erythema and wound present.     Comments: See image of abscess below. Abscess to the dorsal radial aspect of the left wrist is slightly fluctuant with evidence of purulent drainage, erythema, swelling, surrounding cellulitis,warmth, and tenderness.   Neurological:     General: No focal deficit present.     Mental Status: He is alert and oriented to person, place, and time. Mental status is at baseline.     Cranial Nerves: No dysarthria or facial asymmetry.  Psychiatric:        Mood and Affect: Mood normal.        Speech: Speech normal.        Behavior: Behavior normal.  Thought Content: Thought content normal.        Judgment: Judgment normal.      UC Treatments / Results  Labs (all labs ordered are listed, but only abnormal results are displayed) Labs Reviewed - No data to display  EKG   Radiology No results found.  Procedures Procedures (including critical care time)  Medications Ordered in UC Medications - No data to display  Initial Impression / Assessment and Plan / UC Course  I have reviewed the triage vital signs and the nursing notes.  Pertinent labs & imaging results that were available during my care of the patient were reviewed by me and considered in my medical decision making (see chart for details).   1. Visit for wound check, abscess of skin of left wrist, left wrist pain Reviewed previous notes from visits for abscess (12/25/22 and 12/27/22). No allergies to antibiotics. Stop Augmentin, switch to Bactrim twice daily for 7 days with food.  Continue mupirocin. Dressing applied in clinic, advised to change this at least every 8 hours daily. Warm compresses recommended. Neurovascularly intact distally with stable MSK exam, therefore deferred imaging. No systemic symptoms, no indication for referral to ER for further workup currently given presentation; however, strict ER and urgent care return precautions discussed. He has an appointment with PCP tomorrow and has been encouraged to follow-up.   Patient requesting further scheduled medications for pain relief. Tylenol is not helping. Not a candidate for NSAID due to anticoagulation. PDMP reviewed. It appears that patient has been receiving narcotics from urgent care over the last few months for various reasons including hydrocodone-acetaminophen and tramadol. Cannot find where patient has been prescribed methadone, although he states he is taking this daily at . Inquired about discussion surrounding management of acute pain with methadone prescriber, he does not remember having talked about this. Per prescription of tramadol from visit on 12/27/22, patient should still have one pill left to be able to take tonight before his PCP appointment but he has not been taking this as prescribed. Encouraged discussion with PCP at visit tomorrow to establish plan for ongoing pain management. Patient voices frustration with this but expresses understanding and agreement with plan prior to discharge.   Discussed physical exam and available lab work findings in clinic with patient.  Counseled patient regarding appropriate use of medications and potential side effects for all medications recommended or prescribed today. Discussed red flag signs and symptoms of worsening condition,when to call the PCP office, return to urgent care, and when to seek higher level of care in the emergency department. Patient verbalizes understanding and agreement with plan. All questions answered. Patient discharged in stable  condition.    Final Clinical Impressions(s) / UC Diagnoses   Final diagnoses:  Visit for wound check  Abscess of skin of left wrist  Left wrist pain  History of intravenous drug abuse     Discharge Instructions      Your wound appears much more infected today.   Stop taking augmentin and start taking Bactrim antibiotic twice daily for 7 days.   Please discuss appropriate pain management with your primary care provider.  The antibiotic will help to further reduce pain by treating the infection.  You may take leftover tramadol you may have from your previous prescription by previous providers.   Please follow-up with your primary care provider tomorrow as scheduled.  If your wound is not improving with use of bactrim in the next 24-48 hours, you need to go to the  ER for  further evaluation.     ED Prescriptions     Medication Sig Dispense Auth. Provider   sulfamethoxazole-trimethoprim (BACTRIM DS) 800-160 MG tablet Take 1 tablet by mouth 2 (two) times daily for 7 days. 14 tablet Carlisle Beers, FNP      PDMP not reviewed this encounter.   Carlisle Beers, [place] 12/31/22 2059

## 2022-12-29 NOTE — Discharge Instructions (Addendum)
Your wound appears much more infected today.   Stop taking augmentin and start taking Bactrim antibiotic twice daily for 7 days.   Please discuss appropriate pain management with your primary care provider.  The antibiotic will help to further reduce pain by treating the infection.  You may take leftover tramadol you may have from your previous prescription by previous providers.   Please follow-up with your primary care provider tomorrow as scheduled.  If your wound is not improving with use of bactrim in the next 24-48 hours, you need to go to the ER for  further evaluation.

## 2022-12-30 ENCOUNTER — Encounter: Payer: Self-pay | Admitting: Family

## 2022-12-30 ENCOUNTER — Ambulatory Visit (INDEPENDENT_AMBULATORY_CARE_PROVIDER_SITE_OTHER): Payer: Medicare HMO | Admitting: Family

## 2022-12-30 VITALS — BP 175/56 | HR 62 | Temp 98.3°F | Resp 20 | Ht 71.0 in | Wt 136.0 lb

## 2022-12-30 DIAGNOSIS — I739 Peripheral vascular disease, unspecified: Secondary | ICD-10-CM

## 2022-12-30 DIAGNOSIS — Z85048 Personal history of other malignant neoplasm of rectum, rectosigmoid junction, and anus: Secondary | ICD-10-CM

## 2022-12-30 DIAGNOSIS — M25532 Pain in left wrist: Secondary | ICD-10-CM | POA: Diagnosis not present

## 2022-12-30 DIAGNOSIS — Z87892 Personal history of anaphylaxis: Secondary | ICD-10-CM

## 2022-12-30 DIAGNOSIS — I10 Essential (primary) hypertension: Secondary | ICD-10-CM | POA: Diagnosis not present

## 2022-12-30 DIAGNOSIS — G5601 Carpal tunnel syndrome, right upper limb: Secondary | ICD-10-CM | POA: Diagnosis not present

## 2022-12-30 DIAGNOSIS — M25512 Pain in left shoulder: Secondary | ICD-10-CM

## 2022-12-30 DIAGNOSIS — E785 Hyperlipidemia, unspecified: Secondary | ICD-10-CM | POA: Diagnosis not present

## 2022-12-30 DIAGNOSIS — Z5189 Encounter for other specified aftercare: Secondary | ICD-10-CM

## 2022-12-30 DIAGNOSIS — G8929 Other chronic pain: Secondary | ICD-10-CM

## 2022-12-30 DIAGNOSIS — L02414 Cutaneous abscess of left upper limb: Secondary | ICD-10-CM | POA: Diagnosis not present

## 2022-12-30 MED ORDER — TRAMADOL HCL 50 MG PO TABS: 50.0000 mg | ORAL_TABLET | Freq: Four times a day (QID) | ORAL | 0 refills | Status: AC | PRN

## 2022-12-30 MED ORDER — EPINEPHRINE 0.3 MG/0.3ML IJ SOAJ: 0.3000 mg | INTRAMUSCULAR | 2 refills | Status: DC | PRN

## 2022-12-30 NOTE — Progress Notes (Signed)
Pain left wrist d/t Infection on left arm no appetite  Leg pain-Sees vein and vascular for stents in legs he mentions he will have to have surgery  Right wrist pain- Sees ortho for the right wrist carpal tunnel, pain 9/10  Has to discontinue blood thinner for surgical procedure on BLE.   Picked up bactrim yesterday from pharmacy. D/c 'd amoxicillin.  Refill on epi-pen

## 2023-01-05 DIAGNOSIS — F1120 Opioid dependence, uncomplicated: Secondary | ICD-10-CM | POA: Diagnosis not present

## 2023-01-14 ENCOUNTER — Inpatient Hospital Stay: Payer: Medicare HMO | Admitting: Oncology

## 2023-01-14 ENCOUNTER — Telehealth: Payer: Self-pay | Admitting: *Deleted

## 2023-01-14 ENCOUNTER — Inpatient Hospital Stay: Payer: Medicare HMO | Attending: Oncology | Admitting: Oncology

## 2023-01-22 DIAGNOSIS — F1120 Opioid dependence, uncomplicated: Secondary | ICD-10-CM | POA: Diagnosis not present

## 2023-01-26 DIAGNOSIS — F1120 Opioid dependence, uncomplicated: Secondary | ICD-10-CM | POA: Diagnosis not present

## 2023-01-27 ENCOUNTER — Telehealth (INDEPENDENT_AMBULATORY_CARE_PROVIDER_SITE_OTHER): Payer: Self-pay

## 2023-01-27 ENCOUNTER — Other Ambulatory Visit: Payer: Self-pay

## 2023-01-27 ENCOUNTER — Encounter (HOSPITAL_BASED_OUTPATIENT_CLINIC_OR_DEPARTMENT_OTHER): Payer: Self-pay | Admitting: Orthopaedic Surgery

## 2023-01-27 NOTE — Telephone Encounter (Signed)
Debbie called to see if a fax was received for Dr Gilda Crease to sign so Filadelfio can stop taking Eliquis and Plavix for a week before having surgery next Wednesday. However, Dr. Gilda Crease is out of clinic until Thursday. Debbie wanted to know if another provider could sign off on it and so Kert could stop his last dose of Plavix today.  Sheppard Plumber, NP signed off on it today.

## 2023-01-30 ENCOUNTER — Encounter (HOSPITAL_BASED_OUTPATIENT_CLINIC_OR_DEPARTMENT_OTHER)
Admission: RE | Admit: 2023-01-30 | Discharge: 2023-01-30 | Disposition: A | Discharge status: Home / Self Care | Payer: Medicare HMO | Source: Ambulatory Visit | Attending: Orthopaedic Surgery | Admitting: Orthopaedic Surgery

## 2023-01-30 DIAGNOSIS — F1410 Cocaine abuse, uncomplicated: Secondary | ICD-10-CM | POA: Insufficient documentation

## 2023-01-30 DIAGNOSIS — Z01812 Encounter for preprocedural laboratory examination: Secondary | ICD-10-CM | POA: Diagnosis not present

## 2023-01-30 LAB — RAPID URINE DRUG SCREEN, HOSP PERFORMED
Amphetamines: NOT DETECTED
Barbiturates: NOT DETECTED
Benzodiazepines: NOT DETECTED
Cocaine: NOT DETECTED
Opiates: NOT DETECTED
Tetrahydrocannabinol: POSITIVE — AB

## 2023-01-30 NOTE — Progress Notes (Signed)

## 2023-02-02 DIAGNOSIS — F1120 Opioid dependence, uncomplicated: Secondary | ICD-10-CM | POA: Diagnosis not present

## 2023-02-03 ENCOUNTER — Other Ambulatory Visit: Payer: Self-pay | Admitting: Orthopaedic Surgery

## 2023-02-03 ENCOUNTER — Other Ambulatory Visit (INDEPENDENT_AMBULATORY_CARE_PROVIDER_SITE_OTHER): Payer: Self-pay | Admitting: Vascular Surgery

## 2023-02-03 DIAGNOSIS — G5601 Carpal tunnel syndrome, right upper limb: Secondary | ICD-10-CM | POA: Insufficient documentation

## 2023-02-03 MED ORDER — HYDROCODONE-ACETAMINOPHEN 7.5-325 MG PO TABS: 1.0000 | ORAL_TABLET | Freq: Every day | ORAL | 0 refills | Status: DC | PRN

## 2023-02-04 ENCOUNTER — Other Ambulatory Visit: Payer: Self-pay

## 2023-02-04 ENCOUNTER — Encounter (HOSPITAL_BASED_OUTPATIENT_CLINIC_OR_DEPARTMENT_OTHER)
Admission: RE | Disposition: A | Discharge status: Home / Self Care | Payer: Self-pay | Source: Home / Self Care | Attending: Orthopaedic Surgery

## 2023-02-04 ENCOUNTER — Ambulatory Visit (HOSPITAL_BASED_OUTPATIENT_CLINIC_OR_DEPARTMENT_OTHER): Payer: Medicare HMO | Admitting: Anesthesiology

## 2023-02-04 ENCOUNTER — Ambulatory Visit (HOSPITAL_BASED_OUTPATIENT_CLINIC_OR_DEPARTMENT_OTHER)
Admission: RE | Admit: 2023-02-04 | Discharge: 2023-02-04 | Disposition: A | Discharge status: Home / Self Care | Payer: Medicare HMO | Attending: Orthopaedic Surgery | Admitting: Orthopaedic Surgery

## 2023-02-04 ENCOUNTER — Encounter (HOSPITAL_BASED_OUTPATIENT_CLINIC_OR_DEPARTMENT_OTHER): Payer: Self-pay | Admitting: Orthopaedic Surgery

## 2023-02-04 DIAGNOSIS — F17210 Nicotine dependence, cigarettes, uncomplicated: Secondary | ICD-10-CM | POA: Insufficient documentation

## 2023-02-04 DIAGNOSIS — J449 Chronic obstructive pulmonary disease, unspecified: Secondary | ICD-10-CM | POA: Insufficient documentation

## 2023-02-04 DIAGNOSIS — Z79899 Other long term (current) drug therapy: Secondary | ICD-10-CM | POA: Insufficient documentation

## 2023-02-04 DIAGNOSIS — Z7902 Long term (current) use of antithrombotics/antiplatelets: Secondary | ICD-10-CM | POA: Insufficient documentation

## 2023-02-04 DIAGNOSIS — K219 Gastro-esophageal reflux disease without esophagitis: Secondary | ICD-10-CM | POA: Insufficient documentation

## 2023-02-04 DIAGNOSIS — I739 Peripheral vascular disease, unspecified: Secondary | ICD-10-CM | POA: Insufficient documentation

## 2023-02-04 DIAGNOSIS — Z85048 Personal history of other malignant neoplasm of rectum, rectosigmoid junction, and anus: Secondary | ICD-10-CM | POA: Insufficient documentation

## 2023-02-04 DIAGNOSIS — I10 Essential (primary) hypertension: Secondary | ICD-10-CM | POA: Diagnosis not present

## 2023-02-04 DIAGNOSIS — Z7901 Long term (current) use of anticoagulants: Secondary | ICD-10-CM | POA: Insufficient documentation

## 2023-02-04 DIAGNOSIS — E785 Hyperlipidemia, unspecified: Secondary | ICD-10-CM | POA: Insufficient documentation

## 2023-02-04 DIAGNOSIS — G5601 Carpal tunnel syndrome, right upper limb: Secondary | ICD-10-CM | POA: Diagnosis not present

## 2023-02-04 DIAGNOSIS — F17200 Nicotine dependence, unspecified, uncomplicated: Secondary | ICD-10-CM | POA: Diagnosis not present

## 2023-02-04 DIAGNOSIS — F32A Depression, unspecified: Secondary | ICD-10-CM | POA: Diagnosis not present

## 2023-02-04 DIAGNOSIS — F419 Anxiety disorder, unspecified: Secondary | ICD-10-CM | POA: Diagnosis not present

## 2023-02-04 DIAGNOSIS — Z01818 Encounter for other preprocedural examination: Secondary | ICD-10-CM

## 2023-02-04 DIAGNOSIS — F1290 Cannabis use, unspecified, uncomplicated: Secondary | ICD-10-CM | POA: Insufficient documentation

## 2023-02-04 DIAGNOSIS — F418 Other specified anxiety disorders: Secondary | ICD-10-CM | POA: Diagnosis not present

## 2023-02-04 DIAGNOSIS — F1410 Cocaine abuse, uncomplicated: Secondary | ICD-10-CM

## 2023-02-04 HISTORY — DX: Other complications of anesthesia, initial encounter: T88.59XA

## 2023-02-04 HISTORY — PX: CARPAL TUNNEL RELEASE: SHX101

## 2023-02-04 SURGERY — CARPAL TUNNEL RELEASE
Anesthesia: Monitor Anesthesia Care | Site: Wrist | Laterality: Right

## 2023-02-04 MED ORDER — LIDOCAINE-EPINEPHRINE (PF) 1 %-1:200000 IJ SOLN
INTRAMUSCULAR | Status: DC | PRN
  Administered 2023-02-04: 4 mL

## 2023-02-04 MED ORDER — FENTANYL CITRATE (PF) 100 MCG/2ML IJ SOLN
INTRAMUSCULAR | Status: AC
  Filled 2023-02-04: qty 2

## 2023-02-04 MED ORDER — BUPIVACAINE HCL (PF) 0.25 % IJ SOLN
INTRAMUSCULAR | Status: DC | PRN
  Administered 2023-02-04: 4 mL

## 2023-02-04 MED ORDER — PROPOFOL 500 MG/50ML IV EMUL
INTRAVENOUS | Status: DC | PRN
  Administered 2023-02-04: 100 ug/kg/min via INTRAVENOUS

## 2023-02-04 MED ORDER — CEFAZOLIN SODIUM-DEXTROSE 2-4 GM/100ML-% IV SOLN
INTRAVENOUS | Status: AC
  Filled 2023-02-04: qty 100

## 2023-02-04 MED ORDER — AMISULPRIDE (ANTIEMETIC) 5 MG/2ML IV SOLN: 10.0000 mg | Freq: Once | INTRAVENOUS | Status: DC | PRN

## 2023-02-04 MED ORDER — FENTANYL CITRATE (PF) 100 MCG/2ML IJ SOLN: 25.0000 ug | INTRAMUSCULAR | Status: DC | PRN

## 2023-02-04 MED ORDER — PROPOFOL 10 MG/ML IV BOLUS
INTRAVENOUS | Status: DC | PRN
  Administered 2023-02-04: 60 mg via INTRAVENOUS

## 2023-02-04 MED ORDER — LIDOCAINE-EPINEPHRINE 1 %-1:100000 IJ SOLN
INTRAMUSCULAR | Status: AC
  Filled 2023-02-04: qty 1

## 2023-02-04 MED ORDER — MIDAZOLAM HCL 2 MG/2ML IJ SOLN
INTRAMUSCULAR | Status: AC
  Filled 2023-02-04: qty 2

## 2023-02-04 MED ORDER — ONDANSETRON HCL 4 MG/2ML IJ SOLN
INTRAMUSCULAR | Status: DC | PRN
  Administered 2023-02-04: 4 mg via INTRAVENOUS

## 2023-02-04 MED ORDER — OXYCODONE HCL 5 MG PO TABS: 5.0000 mg | ORAL_TABLET | Freq: Once | ORAL | Status: DC | PRN

## 2023-02-04 MED ORDER — LACTATED RINGERS IV SOLN: INTRAVENOUS | Status: DC

## 2023-02-04 MED ORDER — LIDOCAINE HCL (CARDIAC) PF 100 MG/5ML IV SOSY
PREFILLED_SYRINGE | INTRAVENOUS | Status: DC | PRN
  Administered 2023-02-04: 40 mg via INTRAVENOUS

## 2023-02-04 MED ORDER — CEFAZOLIN SODIUM-DEXTROSE 2-4 GM/100ML-% IV SOLN
2.0000 g | INTRAVENOUS | Status: AC
  Administered 2023-02-04: 2 g via INTRAVENOUS

## 2023-02-04 MED ORDER — ONDANSETRON HCL 4 MG/2ML IJ SOLN
INTRAMUSCULAR | Status: AC
  Filled 2023-02-04: qty 2

## 2023-02-04 MED ORDER — MIDAZOLAM HCL 5 MG/5ML IJ SOLN
INTRAMUSCULAR | Status: DC | PRN
  Administered 2023-02-04: 2 mg via INTRAVENOUS

## 2023-02-04 MED ORDER — DEXMEDETOMIDINE HCL IN NACL 80 MCG/20ML IV SOLN
INTRAVENOUS | Status: DC | PRN
  Administered 2023-02-04: 8 ug via INTRAVENOUS

## 2023-02-04 MED ORDER — OXYCODONE HCL 5 MG/5ML PO SOLN: 5.0000 mg | Freq: Once | ORAL | Status: DC | PRN

## 2023-02-04 MED ORDER — ACETAMINOPHEN 500 MG PO TABS
1000.0000 mg | ORAL_TABLET | Freq: Once | ORAL | Status: AC
  Administered 2023-02-04: 1000 mg via ORAL

## 2023-02-04 MED ORDER — ACETAMINOPHEN 500 MG PO TABS
ORAL_TABLET | ORAL | Status: AC
  Filled 2023-02-04: qty 2

## 2023-02-04 MED ORDER — FENTANYL CITRATE (PF) 100 MCG/2ML IJ SOLN
INTRAMUSCULAR | Status: DC | PRN
  Administered 2023-02-04 (×2): 50 ug via INTRAVENOUS

## 2023-02-04 SURGICAL SUPPLY — 45 items
BAND INSRT 18 STRL LF DISP RB (MISCELLANEOUS) ×2
BAND RUBBER #18 3X1/16 STRL (MISCELLANEOUS) ×2 IMPLANT
BLADE MINI RND TIP GREEN BEAV (BLADE) ×1 IMPLANT
BLADE SURG 15 STRL LF DISP TIS (BLADE) ×1 IMPLANT
BLADE SURG 15 STRL SS (BLADE) ×1
BNDG CMPR 5X3 KNIT ELC UNQ LF (GAUZE/BANDAGES/DRESSINGS) ×1
BNDG CMPR 9X4 STRL LF SNTH (GAUZE/BANDAGES/DRESSINGS) ×1
BNDG ELASTIC 3INX 5YD STR LF (GAUZE/BANDAGES/DRESSINGS) ×1 IMPLANT
BNDG ESMARK 4X9 LF (GAUZE/BANDAGES/DRESSINGS) ×1 IMPLANT
BRUSH SCRUB EZ PLAIN DRY (MISCELLANEOUS) ×1 IMPLANT
CANISTER SUCT 1200ML W/VALVE (MISCELLANEOUS) ×1 IMPLANT
CORD BIPOLAR FORCEPS 12FT (ELECTRODE) ×1 IMPLANT
COVER BACK TABLE 60X90IN (DRAPES) ×1 IMPLANT
CUFF TOURN SGL QUICK 18X4 (TOURNIQUET CUFF) ×1 IMPLANT
DRAPE EXTREMITY T 121X128X90 (DISPOSABLE) ×1 IMPLANT
DRAPE SURG 17X23 STRL (DRAPES) ×1 IMPLANT
DRAPE U-SHAPE 47X51 STRL (DRAPES) ×1 IMPLANT
DURAPREP 26ML APPLICATOR (WOUND CARE) ×1 IMPLANT
GAUZE SPONGE 4X4 12PLY STRL (GAUZE/BANDAGES/DRESSINGS) ×1 IMPLANT
GAUZE XEROFORM 1X8 LF (GAUZE/BANDAGES/DRESSINGS) ×1 IMPLANT
GLOVE BIOGEL PI IND STRL 7.5 (GLOVE) ×1 IMPLANT
GLOVE ECLIPSE 7.0 STRL STRAW (GLOVE) ×1 IMPLANT
GLOVE INDICATOR 7.0 STRL GRN (GLOVE) ×1 IMPLANT
GLOVE SURG SYN 7.5  E (GLOVE) ×2
GLOVE SURG SYN 7.5 E (GLOVE) ×2 IMPLANT
GLOVE SURG SYN 7.5 PF PI (GLOVE) ×2 IMPLANT
GOWN STRL REUS W/ TWL LRG LVL3 (GOWN DISPOSABLE) ×1 IMPLANT
GOWN STRL REUS W/TWL LRG LVL3 (GOWN DISPOSABLE) ×1
GOWN STRL SURGICAL XL XLNG (GOWN DISPOSABLE) ×2 IMPLANT
NDL HYPO 25X1 1.5 SAFETY (NEEDLE) ×1 IMPLANT
NEEDLE HYPO 25X1 1.5 SAFETY (NEEDLE) ×1 IMPLANT
NS IRRIG 1000ML POUR BTL (IV SOLUTION) ×1 IMPLANT
PACK BASIN DAY SURGERY FS (CUSTOM PROCEDURE TRAY) ×1 IMPLANT
PAD CAST 3X4 CTTN HI CHSV (CAST SUPPLIES) ×1 IMPLANT
PADDING CAST COTTON 3X4 STRL (CAST SUPPLIES) ×1
SHEET MEDIUM DRAPE 40X70 STRL (DRAPES) ×1 IMPLANT
SPIKE FLUID TRANSFER (MISCELLANEOUS) IMPLANT
SPONGE T-LAP 18X18 ~~LOC~~+RFID (SPONGE) ×1 IMPLANT
STOCKINETTE 4X48 STRL (DRAPES) ×1 IMPLANT
SUT ETHILON 3 0 PS 1 (SUTURE) ×1 IMPLANT
SYR BULB EAR ULCER 3OZ GRN STR (SYRINGE) ×1 IMPLANT
SYR CONTROL 10ML LL (SYRINGE) IMPLANT
TOWEL GREEN STERILE FF (TOWEL DISPOSABLE) ×1 IMPLANT
TRAY DSU PREP LF (CUSTOM PROCEDURE TRAY) ×1 IMPLANT
UNDERPAD 30X36 HEAVY ABSORB (UNDERPADS AND DIAPERS) ×1 IMPLANT

## 2023-02-04 NOTE — Anesthesia Preprocedure Evaluation (Addendum)
Anesthesia Evaluation  Patient identified by MRN, date of birth, ID band Patient awake    Reviewed: Allergy & Precautions, NPO status , Patient's Chart, lab work & pertinent test results  History of Anesthesia Complications (+) history of anesthetic complications (woke up during a vascular procedure)  Airway Mallampati: II  TM Distance: >3 FB Neck ROM: Full    Dental  (+) Edentulous Upper, Edentulous Lower   Pulmonary neg shortness of breath, neg sleep apnea, COPD,  COPD inhaler, neg recent URI, Current Smoker   Pulmonary exam normal breath sounds clear to auscultation       Cardiovascular hypertension, (-) angina + Peripheral Vascular Disease  (-) Past MI, (-) Cardiac Stents and (-) CABG (-) dysrhythmias  Rhythm:Regular Rate:Normal  HLD   Neuro/Psych neg Seizures PSYCHIATRIC DISORDERS Anxiety Depression    CVA, No Residual Symptoms    GI/Hepatic ,GERD  ,,(+)     substance abuse (no recent cocaine use, used marijuana last night)  cocaine use and marijuana useH/o anal cancer   Endo/Other  negative endocrine ROS    Renal/GU negative Renal ROS     Musculoskeletal  (+) Arthritis ,  narcotic dependent  Abdominal   Peds  Hematology  (+) Blood dyscrasia, anemia   Anesthesia Other Findings On Plavix and Eliquis - held for surgery  Reproductive/Obstetrics                             Anesthesia Physical Anesthesia Plan  ASA: 3  Anesthesia Plan: MAC   Post-op Pain Management: Tylenol PO (pre-op)*   Induction: Intravenous  PONV Risk Score and Plan: 1 and Ondansetron, Propofol infusion and Treatment may vary due to age or medical condition  Airway Management Planned: Natural Airway and Simple Face Mask  Additional Equipment:   Intra-op Plan:   Post-operative Plan:   Informed Consent: I have reviewed the patients History and Physical, chart, labs and discussed the procedure including  the risks, benefits and alternatives for the proposed anesthesia with the patient or authorized representative who has indicated his/her understanding and acceptance.     Dental advisory given  Plan Discussed with: CRNA and Anesthesiologist  Anesthesia Plan Comments: (Discussed with patient risks of MAC including, but not limited to, minor pain or discomfort, hearing people in the room, and possible need for backup general anesthesia. Risks for general anesthesia also discussed including, but not limited to, sore throat, hoarse voice, chipped/damaged teeth, injury to vocal cords, nausea and vomiting, allergic reactions, lung infection, heart attack, stroke, and death. All questions answered. )        Anesthesia Quick Evaluation

## 2023-02-04 NOTE — Anesthesia Postprocedure Evaluation (Signed)
Anesthesia Post Note  Patient: Martin [name]  Procedure(s) Performed: RIGHT CARPAL TUNNEL RELEASE (Right: Wrist)     Patient location during evaluation: PACU Anesthesia Type: MAC Level of consciousness: awake Pain management: pain level controlled Vital Signs Assessment: post-procedure vital signs reviewed and stable Respiratory status: spontaneous breathing, nonlabored ventilation and respiratory function stable Cardiovascular status: stable and blood pressure returned to baseline Postop Assessment: no apparent nausea or vomiting Anesthetic complications: no   No notable events documented.  Last Vitals:  Vitals:   02/04/23 1315 02/04/23 1330  BP: (!) 107/30 (!) 116/37  Pulse: (!) 45 (!) 46  Resp: 12 11  Temp:    SpO2: 99% 98%    Last Pain:  Vitals:   02/04/23 1315  TempSrc:   PainSc: Asleep                 Linton Rump

## 2023-02-04 NOTE — Op Note (Signed)
   Carpal tunnel op note  DATE OF SURGERY:02/04/2023  PREOPERATIVE DIAGNOSIS:  right carpal tunnel syndrome  POSTOPERATIVE DIAGNOSIS: same  PROCEDURE: Right carpal tunnel release. CPT 40981  SURGEON: Cheral Almas, M.D.  ASSIST: Oneal Grout, [place]  ANESTHESIA:  Local and MAC  TOURNIQUET TIME: less than 20 minutes  BLOOD LOSS: Minimal.  COMPLICATIONS: None.  PATHOLOGY: None.  INDICATIONS: The patient is a 62 y.o. -year-old male who presented with carpal tunnel syndrome failing nonsurgical management, indicated for surgical release.  DESCRIPTION OF PROCEDURE: The patient was identified in the preoperative holding area.  The operative site was marked by the surgeon and confirmed by the patient.  The patient was brought back to the operating room.  MAC anesthesia was administered.  Local anesthetic with epi was injected into the operative site.  A well padded nonsterile tourniquet was placed. The operative extremity was prepped and draped in standard sterile fashion.  A timeout was performed.  Preoperative antibiotics were given.   A palmar incision was made about 5 mm ulnar to the thenar crease.  The palmar aponeurosis was exposed and divided in line with the skin incision. The palmaris brevis was visualized and divided.  The distal edge of the transcarpal ligament was identified. A hemostat was inserted into the carpal tunnel to protect the median nerve and the flexor tendons. Then, the transverse carpal ligament was released under direct visualization. Proximally, a subcutaneous tunnel was made allowing a Sewell retractor to be placed. Then, the distal portion of the antebrachial fascia was released. Distally, all fibrous bands were released. The median nerve was visualized, and the fat pad was exposed. Wound was irrigated and closed with 4-0 nylon sutures. Sterile dressing applied. The patient was transferred to the recovery room in stable condition after all counts were  correct.  POSTOPERATIVE PLAN: To start nerve gliding exercises as tolerated and no heavy lifting for four weeks.  Glee Arvin, M.D. [facility] 12:41 PM

## 2023-02-04 NOTE — Transfer of Care (Signed)
Immediate Anesthesia Transfer of Care Note  Patient: Martin [name]  Procedure(s) Performed: RIGHT CARPAL TUNNEL RELEASE (Right: Wrist)  Patient Location: PACU  Anesthesia Type:MAC  Level of Consciousness: sedated  Airway & Oxygen Therapy: Patient Spontanous Breathing  Post-op Assessment: Report given to RN and Post -op Vital signs reviewed and stable  Post vital signs: Reviewed and stable  Last Vitals:  Vitals Value Taken Time  BP 104/32 02/04/23 1253  Temp    Pulse 46 02/04/23 1255  Resp 16 02/04/23 1255  SpO2 100 % 02/04/23 1255  Vitals shown include unvalidated device data.  Last Pain:  Vitals:   02/04/23 1048  TempSrc: Temporal  PainSc: 10-Worst pain ever      Patients Stated Pain Goal: 3 (02/04/23 1048)  Complications: No notable events documented.

## 2023-02-04 NOTE — H&P (Signed)
PREOPERATIVE H&P  Chief Complaint: right carpal tunnel syndrome  HPI: Martin [name] is a 62 y.o. male who presents for surgical treatment of right carpal tunnel syndrome.  He denies any changes in medical history.  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    Anxiety    Arthritis    DDD lumbar, arthritis knees   Complication of anesthesia    pt states he woke up during a vascular procedure   COPD (chronic obstructive pulmonary disease) (HCC)    DJD (degenerative joint disease) of lumbar spine    GERD (gastroesophageal reflux disease)    Hemorrhoid    internal   History of bowel resection 04/19/2017   Hyperlipidemia    Hypertension    Pt states he's never been told or treated for HTN   Illicit drug use    + UDS cocaine, THC, opiates 10/11/21   Inguinal hernia    MSSA bacteremia 10/11/2021   s/p IV cefazolin, po Linezolid; 10/16/21 TEE w/o vegetations.   Peripheral vascular disease (HCC)    Pneumonia    as child, cough at present with no fever   Pneumonia    as child, cough at present with no fever    Radiation 11/19/11-01/08/12   5040 cGy 28 fx Pelvis and inguinal area   Stroke [facility])    no weaknes or paralysis   Substance abuse (HCC)    last use was Jan. 2024; patient states he has "been clean" since then   Past Surgical History:  Procedure Laterality Date   APPENDECTOMY     age 1 or 87   EXAMINATION UNDER ANESTHESIA  10/14/2011   Procedure: EXAM UNDER ANESTHESIA;  Surgeon: Velora Heckler, MD;  Location: [facility];  Service: General;  Laterality: N/A;  exam under anethesia, Excision of mass anal canal, 1.5cm   INGUINAL HERNIA REPAIR  05/11/2012   Procedure: HERNIA REPAIR INGUINAL ADULT;  Surgeon: Velora Heckler, MD;  Location: [facility];  Service: General;  Laterality: Left;  left inguinal hernia repair with mesh   LOWER EXTREMITY ANGIOGRAPHY Left 07/02/2021    Procedure: LOWER EXTREMITY ANGIOGRAPHY;  Surgeon: Renford Dills, MD;  Location: [facility];  Service: Cardiovascular;  Laterality: Left;   LOWER EXTREMITY ANGIOGRAPHY Right 07/16/2021   Procedure: LOWER EXTREMITY ANGIOGRAPHY;  Surgeon: Renford Dills, MD;  Location: [facility];  Service: Cardiovascular;  Laterality: Right;   LOWER EXTREMITY ANGIOGRAPHY Left 08/27/2021   Procedure: LOWER EXTREMITY ANGIOGRAPHY;  Surgeon: Renford Dills, MD;  Location: [facility];  Service: Cardiovascular;  Laterality: Left;   LOWER EXTREMITY ANGIOGRAPHY Left 01/14/2022   Procedure: Lower Extremity Angiography;  Surgeon: Renford Dills, MD;  Location: [facility];  Service: Cardiovascular;  Laterality: Left;   LOWER EXTREMITY ANGIOGRAPHY Left 11/04/2022   Procedure: Lower Extremity Angiography;  Surgeon: Renford Dills, MD;  Location: [facility];  Service: Cardiovascular;  Laterality: Left;   SMALL INTESTINE SURGERY  04/19/2017   exploratory lap, partial small bowel resection for perforated ulcer/abcess, radiation enteritis   surgical pathology   10/14/2011   squamous cell ca of anus   TEE WITHOUT CARDIOVERSION N/A 10/16/2021   Procedure: TRANSESOPHAGEAL ECHOCARDIOGRAM (TEE);  Surgeon: Chrystie Nose, MD;  Location: [facility];  Service: Cardiovascular;  Laterality: N/A;   TOOTH EXTRACTION N/A 12/04/2021  Procedure: DENTAL RESTORATION/EXTRACTIONS;  Surgeon: Ocie Doyne, DMD;  Location: [facility] OR;  Service: Oral Surgery;  Laterality: N/A;   transanal excision  10/14/2011   Dr.Todd Gerkin   Social History   Socioeconomic History   Marital status: Widowed    Spouse name: Not on file   Number of children: 3   Years of education: Not on file   Highest education level: Not on file  Occupational History   Occupation: unemployed  Tobacco Use   Smoking status: Every Day    Packs/day: 1.00    Years: 47.00    Additional pack years: 0.00     Total pack years: 47.00    Types: Cigarettes   Smokeless tobacco: Never   Tobacco comments:    Cutting back>> Quit Smart card supplied  Building services engineer Use: Never used  Substance and Sexual Activity   Alcohol use: Not Currently    Comment: occasional   Drug use: Yes    Types: Marijuana, "Crack" cocaine    Comment: hx cocaine use- states stopped 6 months ago but last positive drug screen 09/2022   Sexual activity: Yes    Birth control/protection: Condom  Other Topics Concern   Not on file  Social History Narrative   Not on file   Social Determinants of Health   Financial Resource Strain: Not on file  Food Insecurity: Not on file  Transportation Needs: Not on file  Physical Activity: Not on file  Stress: Not on file  Social Connections: Not on file   Family History  Problem Relation Age of Onset   Colon cancer Father    Asthma Father    Cancer Father        prostate   Hypertension Father    Asthma Mother    Hypertension Mother    Stomach cancer Neg Hx    Esophageal cancer Neg Hx    Allergies  Allergen Reactions   Aspirin Swelling    Lip swelling   Yellow Jacket Venom [Bee Venom] Anaphylaxis   Codeine Nausea Only   Ibuprofen     On blood thinners   Morphine And Codeine Itching   Prior to Admission medications   Medication Sig Start Date End Date Taking? Authorizing Provider  albuterol (VENTOLIN HFA) 108 (90 Base) MCG/ACT inhaler Inhale 2 puffs into the lungs every 6 (six) hours as needed for wheezing or shortness of breath. 07/23/22  Yes White, Adrienne R, NP  atorvastatin (LIPITOR) 10 MG tablet Take 1 tablet (10 mg total) by mouth daily. Additional refills per primary provider 11/04/22 11/04/23 Yes Schnier, Latina Craver, MD  clopidogrel (PLAVIX) 75 MG tablet Take 1 tablet (75 mg total) by mouth daily. 10/07/22  Yes Georgiana Spinner, NP  HYDROcodone-acetaminophen (NORCO) 7.5-325 MG tablet Take 1-2 tablets by mouth daily as needed for moderate pain. 02/03/23   Tarry Kos, MD  METHADONE HCL PO Take 58 mg by mouth daily.   Yes [provider]  Multiple Vitamin (MULTIVITAMIN ADULT PO) Take by mouth daily.   Yes [provider]  tamsulosin (FLOMAX) 0.4 MG CAPS capsule Take 1 capsule (0.4 mg total) by mouth at bedtime. 09/16/22  Yes Georganna Skeans, MD  ELIQUIS 2.5 MG TABS tablet Take 1 tablet by mouth twice daily 02/03/23   Georgiana Spinner, NP  EPINEPHrine (EPIPEN 2-PAK) 0.3 mg/0.3 mL IJ SOAJ injection Inject 0.3 mg (1 pen) into the muscle as needed for anaphylaxis. 12/30/22   Rema Fendt, NP  feeding  supplement (ENSURE ENLIVE / ENSURE PLUS) LIQD Take 237 mLs by mouth 3 (three) times daily between meals. Patient not taking: Reported on 12/30/2022 05/01/22   Marguerita Merles Latif, DO  mupirocin ointment (BACTROBAN) 2 % Apply 1 Application topically 2 (two) times daily. To affected area till better Patient not taking: Reported on 12/30/2022 12/25/22   Zenia Resides, MD  sucralfate (CARAFATE) 1 GM/10ML suspension Take 10 mLs (1 g total) by mouth 4 (four) times daily -  with meals and at bedtime. Patient not taking: Reported on 11/28/2019 10/21/19 06/13/20  Palumbo, April, MD     Positive ROS: All other systems have been reviewed and were otherwise negative with the exception of those mentioned in the HPI and as above.  Physical Exam: General: Alert, no acute distress Cardiovascular: No pedal edema Respiratory: No cyanosis, no use of accessory musculature GI: abdomen soft Skin: No lesions in the area of chief complaint Neurologic: Sensation intact distally Psychiatric: Patient is competent for consent with normal mood and affect Lymphatic: no lymphedema  MUSCULOSKELETAL: exam stable  Assessment: right carpal tunnel syndrome  Plan: Plan for Procedure(s): RIGHT CARPAL TUNNEL RELEASE  The risks benefits and alternatives were discussed with the patient including but not limited to the risks of nonoperative treatment, versus surgical  intervention including infection, bleeding, nerve injury,  blood clots, cardiopulmonary complications, morbidity, mortality, among others, and they were willing to proceed.   Glee Arvin, MD 02/04/2023 11:45 AM

## 2023-02-04 NOTE — Discharge Instructions (Addendum)
Postoperative instructions:  Weightbearing instructions: don't lift more than 10 lbs for 4 weeks  Dressing instructions: Keep your dressing and/or splint clean and dry at all times.  It will be removed at your first post-operative appointment.  Your stitches and/or staples will be removed at this visit.  Incision instructions:  Do not soak your incision for 3 weeks after surgery.  If the incision gets wet, pat dry and do not scrub the incision.  Pain control:  You have been given a prescription to be taken as directed for post-operative pain control.  In addition, elevate the operative extremity above the heart at all times to prevent swelling and throbbing pain.  Take over-the-counter Colace, 100mg  by mouth twice a day while taking narcotic pain medications to help prevent constipation.  Follow up appointments: 1) 10 days for suture removal and wound check. 2) Dr. Roda Shutters as scheduled.   -------------------------------------------------------------------------------------------------------------  After Surgery Pain Control:  After your surgery, post-surgical discomfort or pain is likely. This discomfort can last several days to a few weeks. At certain times of the day your discomfort may be more intense.  Did you receive a nerve block?  A nerve block can provide pain relief for one hour to two days after your surgery. As long as the nerve block is working, you will experience little or no sensation in the area the surgeon operated on.  As the nerve block wears off, you will begin to experience pain or discomfort. It is very important that you begin taking your prescribed pain medication before the nerve block fully wears off. Treating your pain at the first sign of the block wearing off will ensure your pain is better controlled and more tolerable when full-sensation returns. Do not wait until the pain is intolerable, as the medicine will be less effective. It is better to treat pain in  advance than to try and catch up.  General Anesthesia:  If you did not receive a nerve block during your surgery, you will need to start taking your pain medication shortly after your surgery and should continue to do so as prescribed by your surgeon.  Pain Medication:  Most commonly we prescribe Vicodin and Percocet for post-operative pain. Both of these medications contain a combination of acetaminophen (Tylenol) and a narcotic to help control pain.   It takes between 30 and 45 minutes before pain medication starts to work. It is important to take your medication before your pain level gets too intense.   Nausea is a common side effect of many pain medications. You will want to eat something before taking your pain medicine to help prevent nausea.   If you are taking a prescription pain medication that contains acetaminophen, we recommend that you do not take additional over the counter acetaminophen (Tylenol).  Other pain relieving options:   Using a cold pack to ice the affected area a few times a day (15 to 20 minutes at a time) can help to relieve pain, reduce swelling and bruising.   Elevation of the affected area can also help to reduce pain and swelling.  May take Tylenol after 5pm, if needed.    Post Anesthesia Home Care Instructions  Activity: Get plenty of rest for the remainder of the day. A responsible individual must stay with you for 24 hours following the procedure.  For the next 24 hours, DO NOT: -Drive a car -Advertising copywriter -Drink alcoholic beverages -Take any medication unless instructed by your physician -Make any legal  decisions or sign important papers.  Meals: Start with liquid foods such as gelatin or soup. Progress to regular foods as tolerated. Avoid greasy, spicy, heavy foods. If nausea and/or vomiting occur, drink only clear liquids until the nausea and/or vomiting subsides. Call your physician if vomiting continues.  Special  Instructions/Symptoms: Your throat may feel dry or sore from the anesthesia or the breathing tube placed in your throat during surgery. If this causes discomfort, gargle with warm salt water. The discomfort should disappear within 24 hours.  If you had a scopolamine patch placed behind your ear for the management of post- operative nausea and/or vomiting:  1. The medication in the patch is effective for 72 hours, after which it should be removed.  Wrap patch in a tissue and discard in the trash. Wash hands thoroughly with soap and water. 2. You may remove the patch earlier than 72 hours if you experience unpleasant side effects which may include dry mouth, dizziness or visual disturbances. 3. Avoid touching the patch. Wash your hands with soap and water after contact with the patch.   Per Dr. Roda Shutters, you may resume eliquis and plavix tomorrow 5/30

## 2023-02-05 ENCOUNTER — Encounter (HOSPITAL_BASED_OUTPATIENT_CLINIC_OR_DEPARTMENT_OTHER): Payer: Self-pay | Admitting: Orthopaedic Surgery

## 2023-02-09 DIAGNOSIS — F1120 Opioid dependence, uncomplicated: Secondary | ICD-10-CM | POA: Diagnosis not present

## 2023-02-13 ENCOUNTER — Ambulatory Visit (INDEPENDENT_AMBULATORY_CARE_PROVIDER_SITE_OTHER): Payer: Medicare HMO | Admitting: Orthopaedic Surgery

## 2023-02-13 DIAGNOSIS — G5601 Carpal tunnel syndrome, right upper limb: Secondary | ICD-10-CM

## 2023-02-13 NOTE — Progress Notes (Signed)
Post-Op Visit Note   Patient: Martin [name]           Date of Birth: [birth year]           MRN: [id number] Visit Date: 02/13/2023 PCP: Rema Fendt, NP   Assessment & Plan:  Chief Complaint:  Chief Complaint  Patient presents with   Right Wrist - Routine Post Op    POST OP/RIGHT CTR (surgery date 02-04-23)   Visit Diagnoses:  1. Right carpal tunnel syndrome     Plan: Helmer is a 9 days status post right carpal tunnel release.  He is doing well overall.  He reports a significant improvement in his symptoms already.  Examination of right hand shows healed surgical incision without any signs of infection or drainage.  Neurovascular intact.  Sutures removed Steri-Strips applied.  Recommend that he wear the removable splint for another couple weeks at night.  Wean as tolerated.  Recheck in 4 weeks.  Follow-Up Instructions: Return in about 4 weeks (around 03/13/2023).   Orders:  No orders of the defined types were placed in this encounter.  No orders of the defined types were placed in this encounter.   Imaging: No results found.  PMFS History: Patient Active Problem List   Diagnosis Date Noted   Carpal tunnel syndrome on right 02/03/2023   Right carpal tunnel syndrome 10/28/2022   Protein-calorie malnutrition, severe 04/29/2022   Cellulitis of right lower extremity 04/28/2022   Sepsis 10/12/2021   Peripheral vascular disease    AKI    Tobacco use    COPD (chronic obstructive pulmonary disease) (HCC)    BPH     Periodontal disease    IVDU, cocaine use    Demand ischemia    Thrombocytopenia    Normocytic anemia    MSSA bacteremia 10/11/2021   Callus 06/12/2021   Hav (hallux abducto valgus), unspecified laterality 06/12/2021   Hammer toes, bilateral 06/12/2021   Atherosclerosis of native arteries of extremity with intermittent claudication (HCC) 06/12/2021   Hyperlipidemia 06/07/2021   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   Elevated  blood-pressure reading, without diagnosis of hypertension 09/26/2020   Body mass index (BMI) 29.0-29.9, adult 08/29/2020   Partial small bowel obstruction (HCC) 03/10/2020   Small bowel obstruction due to adhesions (HCC) 03/08/2020   Enteritis 10/15/2019   Abnormal liver function 10/15/2019   Leukocytosis 10/15/2019   Nausea & vomiting 10/15/2019   GAD (generalized anxiety disorder) 05/18/2017   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   Constipation 04/11/2017   Bee sting 04/05/2017   Diverticulosis 11/29/2014   Healthcare maintenance 11/06/2014   Cough 10/05/2014   Insomnia 09/13/2014   Erectile dysfunction 11/30/2013   Chronic pain syndrome 11/30/2013   Depression 11/30/2013   GERD (gastroesophageal reflux disease)    Hypertension    Anxiety    Arthritis    Osteoarthritis of lumbar spine    Radiation    Night sweats 08/12/2012   Tobacco abuse counseling 05/26/2012   Lymphocytic colitis 05/03/2012   Vitamin B12 deficiency 03/19/2012   Folate deficiency 03/19/2012   Inguinal hernia 11/24/2011   Cancer (HCC) 10/14/2011   Internal hemorrhoid 09/11/2011   Anal cancer 10/09/2010   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    Anxiety    Arthritis    DDD lumbar, arthritis knees   Complication of anesthesia    pt states he woke up  during a vascular procedure   COPD (chronic obstructive pulmonary disease) (HCC)    DJD (degenerative joint disease) of lumbar spine    GERD (gastroesophageal reflux disease)    Hemorrhoid    internal   History of bowel resection 04/19/2017   Hyperlipidemia    Hypertension    Pt states he's never been told or treated for HTN   Illicit drug use    + UDS cocaine, THC, opiates 10/11/21   Inguinal hernia    MSSA bacteremia 10/11/2021   s/p IV cefazolin, po Linezolid; 10/16/21 TEE w/o vegetations.   Peripheral vascular disease (HCC)    Pneumonia    as child, cough  at present with no fever   Pneumonia    as child, cough at present with no fever    Radiation 11/19/11-01/08/12   5040 cGy 28 fx Pelvis and inguinal area   Stroke [facility])    no weaknes or paralysis   Substance abuse (HCC)    last use was Jan. 2024; patient states he has "been clean" since then    Family History  Problem Relation Age of Onset   Colon cancer Father    Asthma Father    Cancer Father        prostate   Hypertension Father    Asthma Mother    Hypertension Mother    Stomach cancer Neg Hx    Esophageal cancer Neg Hx     Past Surgical History:  Procedure Laterality Date   APPENDECTOMY     age 10 or 89   CARPAL TUNNEL RELEASE Right 02/04/2023   Procedure: RIGHT CARPAL TUNNEL RELEASE;  Surgeon: Tarry Kos, MD;  Location: [facility];  Service: Orthopedics;  Laterality: Right;   EXAMINATION UNDER ANESTHESIA  10/14/2011   Procedure: EXAM UNDER ANESTHESIA;  Surgeon: Velora Heckler, MD;  Location: [facility];  Service: General;  Laterality: N/A;  exam under anethesia, Excision of mass anal canal, 1.5cm   INGUINAL HERNIA REPAIR  05/11/2012   Procedure: HERNIA REPAIR INGUINAL ADULT;  Surgeon: Velora Heckler, MD;  Location: [facility];  Service: General;  Laterality: Left;  left inguinal hernia repair with mesh   LOWER EXTREMITY ANGIOGRAPHY Left 07/02/2021   Procedure: LOWER EXTREMITY ANGIOGRAPHY;  Surgeon: Renford Dills, MD;  Location: [facility];  Service: Cardiovascular;  Laterality: Left;   LOWER EXTREMITY ANGIOGRAPHY Right 07/16/2021   Procedure: LOWER EXTREMITY ANGIOGRAPHY;  Surgeon: Renford Dills, MD;  Location: [facility];  Service: Cardiovascular;  Laterality: Right;   LOWER EXTREMITY ANGIOGRAPHY Left 08/27/2021   Procedure: LOWER EXTREMITY ANGIOGRAPHY;  Surgeon: Renford Dills, MD;  Location: [facility];  Service: Cardiovascular;  Laterality: Left;   LOWER EXTREMITY ANGIOGRAPHY Left 01/14/2022   Procedure:  Lower Extremity Angiography;  Surgeon: Renford Dills, MD;  Location: [facility];  Service: Cardiovascular;  Laterality: Left;   LOWER EXTREMITY ANGIOGRAPHY Left 11/04/2022   Procedure: Lower Extremity Angiography;  Surgeon: Renford Dills, MD;  Location: [facility];  Service: Cardiovascular;  Laterality: Left;   SMALL INTESTINE SURGERY  04/19/2017   exploratory lap, partial small bowel resection for perforated ulcer/abcess, radiation enteritis   surgical pathology   10/14/2011   squamous cell ca of anus   TEE WITHOUT CARDIOVERSION N/A 10/16/2021   Procedure: TRANSESOPHAGEAL ECHOCARDIOGRAM (TEE);  Surgeon: Chrystie Nose, MD;  Location: [facility];  Service: Cardiovascular;  Laterality: N/A;   TOOTH EXTRACTION N/A  12/04/2021   Procedure: DENTAL RESTORATION/EXTRACTIONS;  Surgeon: Ocie Doyne, DMD;  Location: [facility] OR;  Service: Oral Surgery;  Laterality: N/A;   transanal excision  10/14/2011   Dr.Todd Gerkin   Social History   Occupational History   Occupation: unemployed  Tobacco Use   Smoking status: Every Day    Packs/day: 1.00    Years: 47.00    Additional pack years: 0.00    Total pack years: 47.00    Types: Cigarettes   Smokeless tobacco: Never   Tobacco comments:    Cutting back>> Quit Smart card supplied  Building services engineer Use: Never used  Substance and Sexual Activity   Alcohol use: Not Currently    Comment: occasional   Drug use: Yes    Types: Marijuana, "Crack" cocaine    Comment: hx cocaine use- states stopped 6 months ago but last positive drug screen 09/2022   Sexual activity: Yes    Birth control/protection: Condom

## 2023-02-16 DIAGNOSIS — F1120 Opioid dependence, uncomplicated: Secondary | ICD-10-CM | POA: Diagnosis not present

## 2023-02-23 DIAGNOSIS — F1120 Opioid dependence, uncomplicated: Secondary | ICD-10-CM | POA: Diagnosis not present

## 2023-02-24 ENCOUNTER — Telehealth: Payer: Self-pay | Admitting: Orthopaedic Surgery

## 2023-02-24 DIAGNOSIS — M79601 Pain in right arm: Secondary | ICD-10-CM

## 2023-02-24 NOTE — Telephone Encounter (Signed)
Referral placed in chart  

## 2023-02-24 NOTE — Telephone Encounter (Signed)
Patient called. Would like to start therapy on his wrist. His call back number is [phone]. Would like to come to [place].

## 2023-02-24 NOTE — Telephone Encounter (Signed)
Yes - thank you

## 2023-02-24 NOTE — Telephone Encounter (Signed)
FYI .Marland KitchenMarland KitchenI talked to the pt. Advised about PT. He stated his palm is swollen and still having numbness. Advised to avoid heavy lifting for now and ice to help with swelling.That they will assess him in PT. He stated understanding

## 2023-02-25 ENCOUNTER — Other Ambulatory Visit (HOSPITAL_BASED_OUTPATIENT_CLINIC_OR_DEPARTMENT_OTHER): Payer: Self-pay

## 2023-02-25 DIAGNOSIS — M79601 Pain in right arm: Secondary | ICD-10-CM

## 2023-03-01 ENCOUNTER — Other Ambulatory Visit (INDEPENDENT_AMBULATORY_CARE_PROVIDER_SITE_OTHER): Payer: Self-pay | Admitting: Nurse Practitioner

## 2023-03-02 ENCOUNTER — Other Ambulatory Visit (INDEPENDENT_AMBULATORY_CARE_PROVIDER_SITE_OTHER): Payer: Self-pay | Admitting: Nurse Practitioner

## 2023-03-02 DIAGNOSIS — I739 Peripheral vascular disease, unspecified: Secondary | ICD-10-CM

## 2023-03-02 DIAGNOSIS — F1120 Opioid dependence, uncomplicated: Secondary | ICD-10-CM | POA: Diagnosis not present

## 2023-03-05 ENCOUNTER — Ambulatory Visit (INDEPENDENT_AMBULATORY_CARE_PROVIDER_SITE_OTHER): Payer: Medicare HMO | Admitting: Nurse Practitioner

## 2023-03-05 ENCOUNTER — Ambulatory Visit (INDEPENDENT_AMBULATORY_CARE_PROVIDER_SITE_OTHER): Payer: Medicare HMO

## 2023-03-05 ENCOUNTER — Encounter (INDEPENDENT_AMBULATORY_CARE_PROVIDER_SITE_OTHER): Payer: Self-pay | Admitting: Vascular Surgery

## 2023-03-05 VITALS — BP 158/52 | HR 66 | Resp 16 | Wt 132.0 lb

## 2023-03-05 DIAGNOSIS — J439 Emphysema, unspecified: Secondary | ICD-10-CM | POA: Diagnosis not present

## 2023-03-05 DIAGNOSIS — Z720 Tobacco use: Secondary | ICD-10-CM

## 2023-03-05 DIAGNOSIS — I739 Peripheral vascular disease, unspecified: Secondary | ICD-10-CM | POA: Diagnosis not present

## 2023-03-05 DIAGNOSIS — I10 Essential (primary) hypertension: Secondary | ICD-10-CM | POA: Diagnosis not present

## 2023-03-05 DIAGNOSIS — I70213 Atherosclerosis of native arteries of extremities with intermittent claudication, bilateral legs: Secondary | ICD-10-CM

## 2023-03-05 DIAGNOSIS — Z9889 Other specified postprocedural states: Secondary | ICD-10-CM

## 2023-03-05 NOTE — Progress Notes (Signed)
MRN : [id number]  Martin [name] is a 62 y.o. (September 26, 1960) male who presents with chief complaint of check circulation.  History of Present Illness:   The patient returns to the office for followup and review status post angiogram with intervention on 11/04/2022.   Procedure:  Percutaneous transluminal angioplasty and stent placement left popliteal artery 2.    Percutaneous transluminal angioplasty left profunda femoris 3.    Percutaneous transluminal angioplasty left superficial femoral artery within the previously placed stent. 4.    Percutaneous transluminal angioplasty left tibioperoneal trunk and peroneal to 4 mm  The patient notes improvement in the lower extremity symptoms. No interval shortening of the patient's claudication distance or rest pain symptoms. No new ulcers or wounds have occurred since the last visit.  There have been no significant changes to the patient's overall health care.  No documented history of amaurosis fugax or recent TIA symptoms. There are no recent neurological changes noted. No documented history of DVT, PE or superficial thrombophlebitis. The patient denies recent episodes of angina or shortness of breath.   ABI's Rt=1.08 and Lt=1.12  (previous ABI's Rt=0.91 and Lt=0.91) Duplex US of the bilateral tibial vessels has triphasic waveforms with good toe waveforms bilaterally.  Current Meds  Medication Sig   albuterol (VENTOLIN HFA) 108 (90 Base) MCG/ACT inhaler Inhale 2 puffs into the lungs every 6 (six) hours as needed for wheezing or shortness of breath.   atorvastatin (LIPITOR) 10 MG tablet Take 1 tablet (10 mg total) by mouth daily. Additional refills per primary provider   clopidogrel (PLAVIX) 75 MG tablet Take 1 tablet (75 mg total) by mouth daily.   ELIQUIS 2.5 MG TABS tablet Take 1 tablet by mouth twice daily   EPINEPHrine (EPIPEN 2-PAK) 0.3 mg/0.3 mL IJ SOAJ injection  Inject 0.3 mg (1 pen) into the muscle as needed for anaphylaxis.   HYDROcodone-acetaminophen (NORCO) 7.5-325 MG tablet Take 1-2 tablets by mouth daily as needed for moderate pain.   METHADONE HCL PO Take 58 mg by mouth daily.   Multiple Vitamin (MULTIVITAMIN ADULT PO) Take by mouth daily.   tamsulosin (FLOMAX) 0.4 MG CAPS capsule Take 1 capsule (0.4 mg total) by mouth at bedtime.    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    Anxiety    Arthritis    DDD lumbar, arthritis knees   Complication of anesthesia    pt states he woke up during a vascular procedure   COPD (chronic obstructive pulmonary disease) (HCC)    DJD (degenerative joint disease) of lumbar spine    GERD (gastroesophageal reflux disease)    Hemorrhoid    internal   History of bowel resection 04/19/2017   Hyperlipidemia    Hypertension    Pt states he's never been told or treated for HTN   Illicit drug use    + UDS cocaine, THC, opiates 10/11/21   Inguinal hernia    MSSA bacteremia 10/11/2021   s/p IV cefazolin, po Linezolid; 10/16/21 TEE  w/o vegetations.   Peripheral vascular disease (HCC)    Pneumonia    as child, cough at present with no fever   Pneumonia    as child, cough at present with no fever    Radiation 11/19/11-01/08/12   5040 cGy 28 fx Pelvis and inguinal area   Stroke [facility])    no weaknes or paralysis   Substance abuse (HCC)    last use was Jan. 2024; patient states he has "been clean" since then    Past Surgical History:  Procedure Laterality Date   APPENDECTOMY     age 12 or 57   CARPAL TUNNEL RELEASE Right 02/04/2023   Procedure: RIGHT CARPAL TUNNEL RELEASE;  Surgeon: Tarry Kos, MD;  Location: [facility];  Service: Orthopedics;  Laterality: Right;   EXAMINATION UNDER ANESTHESIA  10/14/2011   Procedure: EXAM UNDER ANESTHESIA;  Surgeon: Velora Heckler, MD;  Location: [facility];  Service:  General;  Laterality: N/A;  exam under anethesia, Excision of mass anal canal, 1.5cm   INGUINAL HERNIA REPAIR  05/11/2012   Procedure: HERNIA REPAIR INGUINAL ADULT;  Surgeon: Velora Heckler, MD;  Location:  SURGERY CENTER;  Service: General;  Laterality: Left;  left inguinal hernia repair with mesh   LOWER EXTREMITY ANGIOGRAPHY Left 07/02/2021   Procedure: LOWER EXTREMITY ANGIOGRAPHY;  Surgeon: Renford Dills, MD;  Location: [facility];  Service: Cardiovascular;  Laterality: Left;   LOWER EXTREMITY ANGIOGRAPHY Right 07/16/2021   Procedure: LOWER EXTREMITY ANGIOGRAPHY;  Surgeon: Renford Dills, MD;  Location: [facility];  Service: Cardiovascular;  Laterality: Right;   LOWER EXTREMITY ANGIOGRAPHY Left 08/27/2021   Procedure: LOWER EXTREMITY ANGIOGRAPHY;  Surgeon: Renford Dills, MD;  Location: [facility];  Service: Cardiovascular;  Laterality: Left;   LOWER EXTREMITY ANGIOGRAPHY Left 01/14/2022   Procedure: Lower Extremity Angiography;  Surgeon: Renford Dills, MD;  Location: [facility];  Service: Cardiovascular;  Laterality: Left;   LOWER EXTREMITY ANGIOGRAPHY Left 11/04/2022   Procedure: Lower Extremity Angiography;  Surgeon: Renford Dills, MD;  Location: [facility];  Service: Cardiovascular;  Laterality: Left;   SMALL INTESTINE SURGERY  04/19/2017   exploratory lap, partial small bowel resection for perforated ulcer/abcess, radiation enteritis   surgical pathology   10/14/2011   squamous cell ca of anus   TEE WITHOUT CARDIOVERSION N/A 10/16/2021   Procedure: TRANSESOPHAGEAL ECHOCARDIOGRAM (TEE);  Surgeon: Chrystie Nose, MD;  Location: [facility];  Service: Cardiovascular;  Laterality: N/A;   TOOTH EXTRACTION N/A 12/04/2021   Procedure: DENTAL RESTORATION/EXTRACTIONS;  Surgeon: Ocie Doyne, DMD;  Location: [facility];  Service: Oral Surgery;  Laterality: N/A;   transanal excision  10/14/2011   Dr.Todd Gerkin     Social History Social History   Tobacco Use   Smoking status: Every Day    Packs/day: 1.00    Years: 47.00    Additional pack years: 0.00    Total pack years: 47.00    Types: Cigarettes   Smokeless tobacco: Never   Tobacco comments:    Cutting back>> Quit Smart card supplied  Building services engineer Use: Never used  Substance Use Topics   Alcohol use: Not Currently    Comment: occasional   Drug use: Yes    Types: Marijuana, "Crack" cocaine    Comment: hx cocaine use- states stopped 6 months ago but last positive drug screen 09/2022    Family History Family History  Problem Relation  Age of Onset   Colon cancer Father    Asthma Father    Cancer Father        prostate   Hypertension Father    Asthma Mother    Hypertension Mother    Stomach cancer Neg Hx    Esophageal cancer Neg Hx     Allergies  Allergen Reactions   Aspirin Swelling    Lip swelling   Yellow Jacket Venom [Bee Venom] Anaphylaxis   Codeine Nausea Only   Ibuprofen     On blood thinners   Morphine And Codeine Itching     REVIEW OF SYSTEMS (Negative unless checked)  Constitutional: [] Weight loss  [] Fever  [] Chills Cardiac: [] Chest pain   [] Chest pressure   [] Palpitations   [] Shortness of breath when laying flat   [] Shortness of breath with exertion. Vascular:  [] Pain in legs with walking   [] Pain in legs at rest  [] History of DVT   [] Phlebitis   [] Swelling in legs   [] Varicose veins   [] Non-healing ulcers Pulmonary:   [] Uses home oxygen   [] Productive cough   [] Hemoptysis   [] Wheeze  [] COPD   [] Asthma Neurologic:  [] Dizziness   [] Seizures   [] History of stroke   [] History of TIA  [] Aphasia   [] Vissual changes   [] Weakness or numbness in arm   [] Weakness or numbness in leg Musculoskeletal:   [] Joint swelling   [] Joint pain   [] Low back pain Hematologic:  [] Easy bruising  [] Easy bleeding   [] Hypercoagulable state   [] Anemic Gastrointestinal:  [] Diarrhea   [] Vomiting  [] Gastroesophageal  reflux/heartburn   [] Difficulty swallowing. Genitourinary:  [] Chronic kidney disease   [] Difficult urination  [] Frequent urination   [] Blood in urine Skin:  [] Rashes   [] Ulcers  Psychological:  [] History of anxiety   []  History of major depression.  Physical Examination  Vitals:   03/05/23 1127  BP: (!) 158/52  Pulse: 66  Resp: 16  Weight: 132 lb (59.9 kg)   Body mass index is 18.41 kg/m. Gen: WD/WN, NAD Head: [place]/AT, No temporalis wasting.  Ear/Nose/Throat: Hearing grossly intact, nares w/o erythema or drainage Eyes: PER, EOMI, sclera nonicteric.  Neck: Supple, no masses.  No bruit or JVD.  Pulmonary:  Good air movement, no audible wheezing, no use of accessory muscles.  Cardiac: RRR, normal S1, S2, no Murmurs. Vascular:  mild trophic changes, no open wounds Vessel Right Left  Radial Palpable Palpable  PT Not Palpable Not Palpable  DP Not Palpable Not Palpable  Gastrointestinal: soft, non-distended. No guarding/no peritoneal signs.  Musculoskeletal: M/S 5/5 throughout.  No visible deformity.  Neurologic: CN 2-12 intact. Pain and light touch intact in extremities.  Symmetrical.  Speech is fluent. Motor exam as listed above. Psychiatric: Judgment intact, Mood & affect appropriate for pt's clinical situation. Dermatologic: No rashes or ulcers noted.  No changes consistent with cellulitis.   CBC Lab Results  Component Value Date   WBC 8.9 04/30/2022   HGB 9.7 (L) 04/30/2022   HCT 30.8 (L) 04/30/2022   MCV 90.9 04/30/2022   PLT 153 04/30/2022    BMET    Component Value Date/Time   NA 140 04/30/2022 0515   NA 137 03/02/2022 1136   NA 141 10/19/2014 1204   K 4.9 04/30/2022 0515   K 4.6 10/19/2014 1204   CL 110 04/30/2022 0515   CO2 25 04/30/2022 0515   CO2 26 10/19/2014 1204   GLUCOSE 100 (H) 04/30/2022 0515   GLUCOSE 91 10/19/2014 1204   BUN  11 11/04/2022 1113   BUN 11 03/02/2022 1136   BUN 10.8 10/19/2014 1204   CREATININE 0.78 11/04/2022 1113   CREATININE  0.83 07/05/2015 1042   CREATININE 0.9 10/19/2014 1204   CALCIUM 9.1 04/30/2022 0515   CALCIUM 9.6 10/19/2014 1204   GFRNONAA >60 11/04/2022 1113   GFRNONAA >89 07/05/2015 1042   GFRAA >60 03/10/2020 1219   GFRAA >89 07/05/2015 1042   CrCl cannot be calculated (Patient's most recent lab result is older than the maximum 21 days allowed.).  COAG Lab Results  Component Value Date   INR 1.1 04/19/2022    Radiology No results found.   Assessment/Plan 1. Atherosclerosis of native artery of both lower extremities with intermittent claudication (HCC)  Recommend:  The patient has evidence of atherosclerosis of the lower extremities with no claudication.  The patient does not voice lifestyle limiting changes at this point in time.  Noninvasive studies do not suggest clinically significant change.  No invasive studies, angiography or surgery at this time The patient should continue walking and begin a more formal exercise program.  The patient should continue antiplatelet therapy and aggressive treatment of the lipid abnormalities  No changes in the patient's medications at this time  Continued surveillance is indicated as atherosclerosis is likely to progress with time.    The patient will continue follow up with noninvasive studies as ordered.   2. Primary hypertension Continue antihypertensive medications as already ordered, these medications have been reviewed and there are no changes at this time.  3. Pulmonary emphysema, unspecified emphysema type (HCC) Continue pulmonary medications and aerosols as already ordered, these medications have been reviewed and there are no changes at this time.   4. Tobacco use Smoking cessation was discussed, 3-10 minutes spent on this topic specifically    Georgiana Spinner, NP  03/06/2023 1:11 AM

## 2023-03-06 ENCOUNTER — Encounter (INDEPENDENT_AMBULATORY_CARE_PROVIDER_SITE_OTHER): Payer: Self-pay | Admitting: Nurse Practitioner

## 2023-03-06 LAB — VAS US ABI WITH/WO TBI
Left ABI: 1.12
Right ABI: 1.08

## 2023-03-09 ENCOUNTER — Encounter: Payer: Self-pay | Admitting: Occupational Therapy

## 2023-03-09 ENCOUNTER — Encounter: Payer: Medicare HMO | Admitting: Occupational Therapy

## 2023-03-09 ENCOUNTER — Ambulatory Visit: Payer: Medicare HMO | Attending: Family | Admitting: Occupational Therapy

## 2023-03-09 DIAGNOSIS — F1120 Opioid dependence, uncomplicated: Secondary | ICD-10-CM | POA: Diagnosis not present

## 2023-03-09 DIAGNOSIS — M79601 Pain in right arm: Secondary | ICD-10-CM | POA: Diagnosis not present

## 2023-03-09 DIAGNOSIS — R208 Other disturbances of skin sensation: Secondary | ICD-10-CM | POA: Diagnosis not present

## 2023-03-09 DIAGNOSIS — M25551 Pain in right hip: Secondary | ICD-10-CM | POA: Diagnosis not present

## 2023-03-09 DIAGNOSIS — R600 Localized edema: Secondary | ICD-10-CM | POA: Diagnosis not present

## 2023-03-09 DIAGNOSIS — M6281 Muscle weakness (generalized): Secondary | ICD-10-CM | POA: Insufficient documentation

## 2023-03-09 DIAGNOSIS — M79641 Pain in right hand: Secondary | ICD-10-CM | POA: Diagnosis not present

## 2023-03-09 NOTE — Patient Instructions (Signed)
CONTRAST BATHS  Definition-a form of alternating heat and cold therapy used to relieve swelling in the hand/arm or swelling within or around nerves & tendons.  It is used before, during, or after exercises or separately throughout the day.  It is important that you follow the directions specifically.  Instructions-Find a reliable way to apply heat (heating pad, rice sock, etc.)  And also way to apply cold (ice and freezer bag, cold pack, ball with ice water, etc.)  And set them up side-by-side on a table.  You will also need a timer or stopwatch.  Begin by placing your hand in the heat for 3 minutes.  After 3 minutes in the heat, remove your hand and transfer into the cold for 1 minute.  Repeat this process for 20 minutes (5 cycles of 3 min heat/1 min cold)  This can be done 1-3 times per day to help reduce swelling, pain, and promote movement in the wrist/fingers.    

## 2023-03-09 NOTE — Therapy (Signed)
OUTPATIENT OCCUPATIONAL THERAPY ORTHO EVALUATION  Patient Name: Martin [name] MRN: [id number] DOB:[birth year], 62 y.o., male Today's Date: 03/10/2023  PCP: Does not have one REFERRING PROVIDER: Tarry Kos, MD  END OF SESSION:  OT End of Session - 03/09/23 1533     Visit Number 1    Number of Visits 13    Date for OT Re-Evaluation 04/24/23    Authorization Type [employer] primary; Medicaid secondary - requires auth    Progress Note Due on Visit 10    OT Start Time 1534    OT Stop Time 1614    OT Time Calculation (min) 40 min    Activity Tolerance Patient tolerated treatment well    Behavior During Therapy [facility] for tasks assessed/performed             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    Anxiety    Arthritis    DDD lumbar, arthritis knees   Complication of anesthesia    pt states he woke up during a vascular procedure   COPD (chronic obstructive pulmonary disease) (HCC)    DJD (degenerative joint disease) of lumbar spine    GERD (gastroesophageal reflux disease)    Hemorrhoid    internal   History of bowel resection 04/19/2017   Hyperlipidemia    Hypertension    Pt states he's never been told or treated for HTN   Illicit drug use    + UDS cocaine, THC, opiates 10/11/21   Inguinal hernia    MSSA bacteremia 10/11/2021   s/p IV cefazolin, po Linezolid; 10/16/21 TEE w/o vegetations.   Peripheral vascular disease (HCC)    Pneumonia    as child, cough at present with no fever   Pneumonia    as child, cough at present with no fever    Radiation 11/19/11-01/08/12   5040 cGy 28 fx Pelvis and inguinal area   Stroke [facility])    no weaknes or paralysis   Substance abuse (HCC)    last use was Jan. 2024; patient states he has "been clean" since then   Past Surgical History:  Procedure Laterality Date   APPENDECTOMY     age 85 or 22   CARPAL TUNNEL RELEASE Right 02/04/2023    Procedure: RIGHT CARPAL TUNNEL RELEASE;  Surgeon: Tarry Kos, MD;  Location: [facility];  Service: Orthopedics;  Laterality: Right;   EXAMINATION UNDER ANESTHESIA  10/14/2011   Procedure: EXAM UNDER ANESTHESIA;  Surgeon: Velora Heckler, MD;  Location: [facility];  Service: General;  Laterality: N/A;  exam under anethesia, Excision of mass anal canal, 1.5cm   INGUINAL HERNIA REPAIR  05/11/2012   Procedure: HERNIA REPAIR INGUINAL ADULT;  Surgeon: Velora Heckler, MD;  Location: [facility];  Service: General;  Laterality: Left;  left inguinal hernia repair with mesh   LOWER EXTREMITY ANGIOGRAPHY Left 07/02/2021   Procedure: LOWER EXTREMITY ANGIOGRAPHY;  Surgeon: Renford Dills, MD;  Location: [facility];  Service: Cardiovascular;  Laterality: Left;   LOWER EXTREMITY ANGIOGRAPHY Right 07/16/2021   Procedure: LOWER EXTREMITY ANGIOGRAPHY;  Surgeon: Renford Dills, MD;  Location: [facility];  Service: Cardiovascular;  Laterality: Right;   LOWER EXTREMITY ANGIOGRAPHY Left 08/27/2021   Procedure: LOWER EXTREMITY ANGIOGRAPHY;  Surgeon: Renford Dills, MD;  Location: [facility];  Service: Cardiovascular;  Laterality:  Left;   LOWER EXTREMITY ANGIOGRAPHY Left 01/14/2022   Procedure: Lower Extremity Angiography;  Surgeon: Renford Dills, MD;  Location: [facility];  Service: Cardiovascular;  Laterality: Left;   LOWER EXTREMITY ANGIOGRAPHY Left 11/04/2022   Procedure: Lower Extremity Angiography;  Surgeon: Renford Dills, MD;  Location: [facility];  Service: Cardiovascular;  Laterality: Left;   SMALL INTESTINE SURGERY  04/19/2017   exploratory lap, partial small bowel resection for perforated ulcer/abcess, radiation enteritis   surgical pathology   10/14/2011   squamous cell ca of anus   TEE WITHOUT CARDIOVERSION N/A 10/16/2021   Procedure: TRANSESOPHAGEAL ECHOCARDIOGRAM (TEE);  Surgeon: Chrystie Nose, MD;   Location: [facility];  Service: Cardiovascular;  Laterality: N/A;   TOOTH EXTRACTION N/A 12/04/2021   Procedure: DENTAL RESTORATION/EXTRACTIONS;  Surgeon: Ocie Doyne, DMD;  Location: [facility];  Service: Oral Surgery;  Laterality: N/A;   transanal excision  10/14/2011   Dr.Todd Gerkin   Patient Active Problem List   Diagnosis Date Noted   Carpal tunnel syndrome on right 02/03/2023   Right carpal tunnel syndrome 10/28/2022   Protein-calorie malnutrition, severe 04/29/2022   Cellulitis of right lower extremity 04/28/2022   Sepsis 10/12/2021   Peripheral vascular disease    AKI    Tobacco use    COPD (chronic obstructive pulmonary disease) (HCC)    BPH     Periodontal disease    IVDU, cocaine use    Demand ischemia    Thrombocytopenia    Normocytic anemia    MSSA bacteremia 10/11/2021   Callus 06/12/2021   Hav (hallux abducto valgus), unspecified laterality 06/12/2021   Hammer toes, bilateral 06/12/2021   Atherosclerosis of native arteries of extremity with intermittent claudication (HCC) 06/12/2021   Hyperlipidemia 06/07/2021   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   Elevated blood-pressure reading, without diagnosis of hypertension 09/26/2020   Body mass index (BMI) 29.0-29.9, adult 08/29/2020   Partial small bowel obstruction (HCC) 03/10/2020   Small bowel obstruction due to adhesions (HCC) 03/08/2020   Enteritis 10/15/2019   Abnormal liver function 10/15/2019   Leukocytosis 10/15/2019   Nausea & vomiting 10/15/2019   GAD (generalized anxiety disorder) 05/18/2017   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   Constipation 04/11/2017   Bee sting 04/05/2017   Diverticulosis 11/29/2014   Healthcare maintenance 11/06/2014   Cough 10/05/2014   Insomnia 09/13/2014   Erectile dysfunction 11/30/2013   Chronic pain syndrome 11/30/2013   Depression 11/30/2013   GERD (gastroesophageal reflux disease)    Hypertension    Anxiety    Arthritis    Osteoarthritis of lumbar  spine    Radiation    Night sweats 08/12/2012   Tobacco abuse counseling 05/26/2012   Lymphocytic colitis 05/03/2012   Vitamin B12 deficiency 03/19/2012   Folate deficiency 03/19/2012   Inguinal hernia 11/24/2011   Cancer (HCC) 10/14/2011   Internal hemorrhoid 09/11/2011   Anal cancer 10/09/2010    ONSET DATE: surgery date 02-04-23  REFERRING DIAG: M79.601 (ICD-10-CM) - Right arm pain  THERAPY DIAG:  Pain in right hand  Muscle weakness (generalized)  Other disturbances of skin sensation  Localized edema  Rationale for Evaluation and Treatment: Rehabilitation  SUBJECTIVE:   SUBJECTIVE STATEMENT: Pt states he has had a lot of swelling and pain in his hand since surgery. He has been having to use his hand to help "pay the bills". He had a nail go through his hand before having carpal tunnel issues.   Pt accompanied by: self  PERTINENT  HISTORY: "POST OP/RIGHT CTR (surgery date 02-04-23) Recommend that he wear the removable splint for another couple weeks at night.  Wean as tolerated.  Recheck in 4 weeks.   Follow-Up Instructions: Return in about 4 weeks (around 03/13/2023)."   PRECAUTIONS: None  WEIGHT BEARING RESTRICTIONS: No  PAIN:  Are you having pain? Yes: NPRS scale: 6/10 Pain location: R wrist Pain description: burning Aggravating factors: moving it Relieving factors: rest; medication  FALLS: Has patient fallen in last 6 months? No  LIVING ENVIRONMENT: Lives with: lives alone Lives in: House/apartment Stairs: Yes: External: 2 steps; on right going up Has following equipment at home: None  PLOF: Independent; driving; on disability  PATIENT GOALS: improve functional use of RUE  NEXT MD VISIT: not yet scheduled  OBJECTIVE:   HAND DOMINANCE: Right  ADLs: Overall ADLs: mod I  FUNCTIONAL OUTCOME MEASURES: Quick Dash: 45.5% disability with use of RUE  UPPER EXTREMITY ROM:     AROM Right (eval) Left (eval)  Shoulder flexion  WNL  Shoulder  abduction  WNL  Elbow flexion  WNL  Elbow extension  WNL  Wrist flexion 52* WNL  Wrist extension 65* WNL  Wrist pronation  WNL  Wrist supination WFL WNL   Digit Composite Flexion  WNL  Digit Composite Extension  WNL  Digit Opposition Full but tightness and painful WNL  (Blank rows = not tested)  UPPER EXTREMITY MMT:     BUE WNL for shoulders and elbows  HAND FUNCTION: Grip strength: Right: 42.9 with pain lbs; Left: 85.9 lbs  COORDINATION: 9 Hole Peg test: Right: 27.8 sec; Left: 27 sec  SENSATION: Paresthesias reported in digits 3 and 4   EDEMA: moderate swelling reported and observed.   COGNITION: Overall cognitive status: Within functional limits for tasks assessed  OBSERVATIONS: Pt appears fairly well-kept. Glasses donned. Moderate swelling and scar tissues appreciable to R palm at area of incision.    TODAY'S TREATMENT:                                                                                                                               Education provided with respect to contrast baths, sleep positioning, and scar tissue massage as noted in pt instructions.    PATIENT EDUCATION: Education details: OT Role and POC; contrast baths, sleep positioning, scar tissue massage Person educated: Patient Education method: Explanation and Handouts Education comprehension: verbalized understanding and needs further education  HOME EXERCISE PROGRAM: 03/09/2023: contrast baths, sleep positioning, scar tissue massage   GOALS:  SHORT TERM GOALS: Target date: 04/07/2023    Patient will demonstrate independence with  HEP with 25% verbal cues or less for proper execution. Baseline: Goal status: INITIAL  2.  Pt will be independent with contrast bath and scar tissue massage as needed for pain and edema management.  Baseline:  Goal status: INITIAL   LONG TERM GOALS: Target date: 04/24/2023  Patient will demonstrate updated RUE HEP with 25% verbal  cues or less for proper  execution. Baseline:  Goal status: INITIAL  2.  Patient will demonstrate at least 16% improvement with quick Dash score (reporting 29.5 % disability or less) indicating improved functional use of affected extremity. Baseline: 45.5% disability with use of RUE  Goal status: INITIAL  3.  Patient will demonstrate at least 55 lbs R grip strength as needed to open jars and other containers without pain at testing. Baseline: 42.9 lbs with reported pain during testing Goal status: INITIAL   ASSESSMENT:  CLINICAL IMPRESSION: Patient is a 62 y.o. male who was seen today for occupational therapy evaluation following carpal tunnel release of RUE. Hx includes appendectomy, LE angiographies, anal cancer, PVD, inguinal hernia, lymphocytic colitis, tobacco abuse, HTN, anxiety, OA, radiation, chronic pain syndrome, depression, insomnia, diverticulosis, GAD, sepsis, and COPD. Patient currently presents below baseline level of functioning demonstrating functional deficits and impairments as noted below. Pt would benefit from skilled OT services in the outpatient setting to work on impairments as noted below to help pt return to PLOF as able.    PERFORMANCE DEFICITS: in functional skills including ADLs, IADLs, sensation, edema, tone, ROM, strength, pain, fascial restrictions, and UE functional use.   IMPAIRMENTS: are limiting patient from ADLs, IADLs, and work.   COMORBIDITIES: may have co-morbidities  that affects occupational performance. Patient will benefit from skilled OT to address above impairments and improve overall function.  MODIFICATION OR ASSISTANCE TO COMPLETE EVALUATION: No modification of tasks or assist necessary to complete an evaluation.  OT OCCUPATIONAL PROFILE AND HISTORY: Problem focused assessment: Including review of records relating to presenting problem.  CLINICAL DECISION MAKING: LOW - limited treatment options, no task modification necessary  REHAB POTENTIAL: Fair given  comorbidities  EVALUATION COMPLEXITY: Low      PLAN:  OT FREQUENCY: 2x/week  OT DURATION: 6 weeks  PLANNED INTERVENTIONS: self care/ADL training, therapeutic exercise, therapeutic activity, neuromuscular re-education, manual therapy, scar mobilization, passive range of motion, splinting, ultrasound, paraffin, fluidotherapy, moist heat, contrast bath, patient/family education, DME and/or AE instructions, and Re-evaluation  RECOMMENDED OTHER SERVICES: none at this time  CONSULTED AND AGREED WITH PLAN OF CARE: Patient  PLAN FOR NEXT SESSION: Review handouts for contrast baths, sleep positioning, scar tissue massage. Initiate RUE ROM HEP. [place]   Delana Meyer, OT 03/10/2023, 2:00 PM

## 2023-03-13 ENCOUNTER — Ambulatory Visit: Payer: Medicare HMO | Admitting: Occupational Therapy

## 2023-03-13 DIAGNOSIS — M25551 Pain in right hip: Secondary | ICD-10-CM

## 2023-03-13 DIAGNOSIS — R600 Localized edema: Secondary | ICD-10-CM | POA: Diagnosis not present

## 2023-03-13 DIAGNOSIS — M79601 Pain in right arm: Secondary | ICD-10-CM | POA: Diagnosis not present

## 2023-03-13 DIAGNOSIS — R208 Other disturbances of skin sensation: Secondary | ICD-10-CM | POA: Diagnosis not present

## 2023-03-13 DIAGNOSIS — M6281 Muscle weakness (generalized): Secondary | ICD-10-CM | POA: Diagnosis not present

## 2023-03-13 DIAGNOSIS — M79641 Pain in right hand: Secondary | ICD-10-CM

## 2023-03-13 NOTE — Therapy (Signed)
OUTPATIENT OCCUPATIONAL THERAPY ORTHO TREATMENT  Patient Name: Martin [name] MRN: [id number] DOB:02/24/1961, 62 y.o., male Today's Date: 03/13/2023  PCP: Does not have one REFERRING PROVIDER: Tarry Kos, MD  END OF SESSION:  OT End of Session - 03/13/23 1149     Visit Number 2    Number of Visits 13    Date for OT Re-Evaluation 04/24/23    Authorization Type [employer] primary; Medicaid secondary - auth approved    Authorization Time Period 03/09/2023 - 04/14/2023    Authorization - Visit Number 1    Authorization - Number of Visits 12    Progress Note Due on Visit 10    OT Start Time 1149    OT Stop Time 1228    OT Time Calculation (min) 39 min    Activity Tolerance Patient tolerated treatment well    Behavior During Therapy [facility] for tasks assessed/performed             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    Anxiety    Arthritis    DDD lumbar, arthritis knees   Complication of anesthesia    pt states he woke up during a vascular procedure   COPD (chronic obstructive pulmonary disease) (HCC)    DJD (degenerative joint disease) of lumbar spine    GERD (gastroesophageal reflux disease)    Hemorrhoid    internal   History of bowel resection 04/19/2017   Hyperlipidemia    Hypertension    Pt states he's never been told or treated for HTN   Illicit drug use    + UDS cocaine, THC, opiates 10/11/21   Inguinal hernia    MSSA bacteremia 10/11/2021   s/p IV cefazolin, po Linezolid; 10/16/21 TEE w/o vegetations.   Peripheral vascular disease (HCC)    Pneumonia    as child, cough at present with no fever   Pneumonia    as child, cough at present with no fever    Radiation 11/19/11-01/08/12   5040 cGy 28 fx Pelvis and inguinal area   Stroke [facility])    no weaknes or paralysis   Substance abuse (HCC)    last use was Jan. 2024; patient states he has "been clean" since then    Past Surgical History:  Procedure Laterality Date   APPENDECTOMY     age 17 or 4   CARPAL TUNNEL RELEASE Right 02/04/2023   Procedure: RIGHT CARPAL TUNNEL RELEASE;  Surgeon: Tarry Kos, MD;  Location: [facility];  Service: Orthopedics;  Laterality: Right;   EXAMINATION UNDER ANESTHESIA  10/14/2011   Procedure: EXAM UNDER ANESTHESIA;  Surgeon: Velora Heckler, MD;  Location: [facility];  Service: General;  Laterality: N/A;  exam under anethesia, Excision of mass anal canal, 1.5cm   INGUINAL HERNIA REPAIR  05/11/2012   Procedure: HERNIA REPAIR INGUINAL ADULT;  Surgeon: Velora Heckler, MD;  Location: [facility];  Service: General;  Laterality: Left;  left inguinal hernia repair with mesh   LOWER EXTREMITY ANGIOGRAPHY Left 07/02/2021   Procedure: LOWER EXTREMITY ANGIOGRAPHY;  Surgeon: Renford Dills, MD;  Location: [facility];  Service: Cardiovascular;  Laterality: Left;   LOWER EXTREMITY ANGIOGRAPHY Right 07/16/2021   Procedure: LOWER EXTREMITY ANGIOGRAPHY;  Surgeon: Renford Dills, MD;  Location: [facility];  Service: Cardiovascular;  Laterality: Right;   LOWER EXTREMITY  ANGIOGRAPHY Left 08/27/2021   Procedure: LOWER EXTREMITY ANGIOGRAPHY;  Surgeon: Renford Dills, MD;  Location: [facility];  Service: Cardiovascular;  Laterality: Left;   LOWER EXTREMITY ANGIOGRAPHY Left 01/14/2022   Procedure: Lower Extremity Angiography;  Surgeon: Renford Dills, MD;  Location: [facility];  Service: Cardiovascular;  Laterality: Left;   LOWER EXTREMITY ANGIOGRAPHY Left 11/04/2022   Procedure: Lower Extremity Angiography;  Surgeon: Renford Dills, MD;  Location: [facility];  Service: Cardiovascular;  Laterality: Left;   SMALL INTESTINE SURGERY  04/19/2017   exploratory lap, partial small bowel resection for perforated ulcer/abcess, radiation enteritis   surgical pathology   10/14/2011   squamous cell ca of anus   TEE  WITHOUT CARDIOVERSION N/A 10/16/2021   Procedure: TRANSESOPHAGEAL ECHOCARDIOGRAM (TEE);  Surgeon: Chrystie Nose, MD;  Location: [facility];  Service: Cardiovascular;  Laterality: N/A;   TOOTH EXTRACTION N/A 12/04/2021   Procedure: DENTAL RESTORATION/EXTRACTIONS;  Surgeon: Ocie Doyne, DMD;  Location: [facility];  Service: Oral Surgery;  Laterality: N/A;   transanal excision  10/14/2011   Dr.Todd Gerkin   Patient Active Problem List   Diagnosis Date Noted   Carpal tunnel syndrome on right 02/03/2023   Right carpal tunnel syndrome 10/28/2022   Protein-calorie malnutrition, severe 04/29/2022   Cellulitis of right lower extremity 04/28/2022   Sepsis 10/12/2021   Peripheral vascular disease    AKI    Tobacco use    COPD (chronic obstructive pulmonary disease) (HCC)    BPH     Periodontal disease    IVDU, cocaine use    Demand ischemia    Thrombocytopenia    Normocytic anemia    MSSA bacteremia 10/11/2021   Callus 06/12/2021   Hav (hallux abducto valgus), unspecified laterality 06/12/2021   Hammer toes, bilateral 06/12/2021   Atherosclerosis of native arteries of extremity with intermittent claudication (HCC) 06/12/2021   Hyperlipidemia 06/07/2021   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   Elevated blood-pressure reading, without diagnosis of hypertension 09/26/2020   Body mass index (BMI) 29.0-29.9, adult 08/29/2020   Partial small bowel obstruction (HCC) 03/10/2020   Small bowel obstruction due to adhesions (HCC) 03/08/2020   Enteritis 10/15/2019   Abnormal liver function 10/15/2019   Leukocytosis 10/15/2019   Nausea & vomiting 10/15/2019   GAD (generalized anxiety disorder) 05/18/2017   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   Constipation 04/11/2017   Bee sting 04/05/2017   Diverticulosis 11/29/2014   Healthcare maintenance 11/06/2014   Cough 10/05/2014   Insomnia 09/13/2014   Erectile dysfunction 11/30/2013   Chronic pain syndrome 11/30/2013   Depression  11/30/2013   GERD (gastroesophageal reflux disease)    Hypertension    Anxiety    Arthritis    Osteoarthritis of lumbar spine    Radiation    Night sweats 08/12/2012   Tobacco abuse counseling 05/26/2012   Lymphocytic colitis 05/03/2012   Vitamin B12 deficiency 03/19/2012   Folate deficiency 03/19/2012   Inguinal hernia 11/24/2011   Cancer (HCC) 10/14/2011   Internal hemorrhoid 09/11/2011   Anal cancer 10/09/2010    ONSET DATE: surgery date 02-04-23  REFERRING DIAG: M79.601 (ICD-10-CM) - Right arm pain  THERAPY DIAG:  Pain in right hand  Muscle weakness (generalized)  Other disturbances of skin sensation  Localized edema  Pain in right hip  Rationale for Evaluation and Treatment: Rehabilitation  SUBJECTIVE:   SUBJECTIVE STATEMENT: Pt states no significant change since the first part of the week. He reports he has not been able  to complete contrast baths as he is so tired when he gets home from work.    Pt accompanied by: self  PERTINENT HISTORY: "POST OP/RIGHT CTR (surgery date 02-04-23) Recommend that he wear the removable splint for another couple weeks at night.  Wean as tolerated.  Recheck in 4 weeks.   Follow-Up Instructions: Return in about 4 weeks (around 03/13/2023)."   PRECAUTIONS: None  WEIGHT BEARING RESTRICTIONS: No  PAIN:  Are you having pain? Yes: NPRS scale: 6/10 Pain location: R wrist Pain description: burning Aggravating factors: moving it Relieving factors: rest; medication  FALLS: Has patient fallen in last 6 months? No  LIVING ENVIRONMENT: Lives with: lives alone Lives in: House/apartment Stairs: Yes: External: 2 steps; on right going up Has following equipment at home: None  PLOF: Independent; driving; on disability  PATIENT GOALS: improve functional use of RUE  NEXT MD VISIT: not yet scheduled  OBJECTIVE:   HAND DOMINANCE: Right  ADLs: Overall ADLs: mod I  FUNCTIONAL OUTCOME MEASURES: Quick Dash: 45.5% disability with  use of RUE  UPPER EXTREMITY ROM:     AROM Right (eval) Left (eval)  Shoulder flexion  WNL  Shoulder abduction  WNL  Elbow flexion  WNL  Elbow extension  WNL  Wrist flexion 52* WNL  Wrist extension 65* WNL  Wrist pronation  WNL  Wrist supination WFL WNL   Digit Composite Flexion  WNL  Digit Composite Extension  WNL  Digit Opposition Full but tightness and painful WNL  (Blank rows = not tested)  UPPER EXTREMITY MMT:     BUE WNL for shoulders and elbows  HAND FUNCTION: Grip strength: Right: 42.9 with pain lbs; Left: 85.9 lbs  COORDINATION: 9 Hole Peg test: Right: 27.8 sec; Left: 27 sec  SENSATION: Paresthesias reported in digits 3 and 4   EDEMA: moderate swelling reported and observed.   COGNITION: Overall cognitive status: Within functional limits for tasks assessed  OBSERVATIONS: Pt appears fairly well-kept. Glasses donned. Moderate swelling and scar tissues appreciable to R palm at area of incision.    TODAY'S TREATMENT:                                                                                                                               OT reviewed contrast baths, sleep positioning, and scar tissue massage as noted in pt instructions. Pt requiring min cues for proper completion.   OT initiated:  - Wrist Flexion Extension AROM - Palms Down  - 4-6 x daily - 10 reps - 5-10 second hold - Hand AROM Tendon Gliding Series  - 4-6 x daily - 10 reps - 5-10 second hold - Thumb AROM Opposition  - 4-6 x daily - 10 reps - 5-10 second hold  - Ultrasound completed for duration as noted below including:  Ultrasound applied to palmar right hand and wrist for 8 minutes, frequency of 3 MHz, 20% duty cycle, and 1.1 W/cm with pt's arm placed on  soft towel for promotion of ROM, edema reduction, and pain reduction in affected extremity.   PATIENT EDUCATION: Education details: reviewed contrast baths, sleep positioning, scar tissue massage; ROM HEP; [place] Person educated:  Patient Education method: Explanation and Handouts Education comprehension: verbalized understanding and needs further education  HOME EXERCISE PROGRAM: 03/09/2023: contrast baths, sleep positioning, scar tissue massage  03/13/2023: Access Code: [email or web address] URL: [email or web address]  GOALS:  SHORT TERM GOALS: Target date: 04/07/2023    Patient will demonstrate independence with  HEP with 25% verbal cues or less for proper execution. Baseline: Goal status: INITIAL  2.  Pt will be independent with contrast bath and scar tissue massage as needed for pain and edema management.  Baseline:  Goal status: INITIAL   LONG TERM GOALS: Target date: 04/24/2023  Patient will demonstrate updated RUE HEP with 25% verbal cues or less for proper execution. Baseline:  Goal status: INITIAL  2.  Patient will demonstrate at least 16% improvement with quick Dash score (reporting 29.5 % disability or less) indicating improved functional use of affected extremity. Baseline: 45.5% disability with use of RUE  Goal status: INITIAL  3.  Patient will demonstrate at least 55 lbs R grip strength as needed to open jars and other containers without pain at testing. Baseline: 42.9 lbs with reported pain during testing Goal status: INITIAL   ASSESSMENT:  CLINICAL IMPRESSION: Patient demonstrates good understanding of HEP but has yet to establish consistent carry-over as needed to progress towards goals.   PERFORMANCE DEFICITS: in functional skills including ADLs, IADLs, sensation, edema, tone, ROM, strength, pain, fascial restrictions, and UE functional use.   IMPAIRMENTS: are limiting patient from ADLs, IADLs, and work.   COMORBIDITIES: may have co-morbidities  that affects occupational performance. Patient will benefit from skilled OT to address above impairments and improve overall function.  REHAB POTENTIAL: Fair given comorbidities  PLAN:  OT FREQUENCY: 2x/week  OT DURATION: 6  weeks  PLANNED INTERVENTIONS: self care/ADL training, therapeutic exercise, therapeutic activity, neuromuscular re-education, manual therapy, scar mobilization, passive range of motion, splinting, ultrasound, paraffin, fluidotherapy, moist heat, contrast bath, patient/family education, DME and/or AE instructions, and Re-evaluation  RECOMMENDED OTHER SERVICES: none at this time  CONSULTED AND AGREED WITH PLAN OF CARE: Patient  PLAN FOR NEXT SESSION: Review RUE ROM HEP; initiate putty; [place]   Delana Meyer, OT 03/13/2023, 2:18 PM

## 2023-03-16 DIAGNOSIS — F1120 Opioid dependence, uncomplicated: Secondary | ICD-10-CM | POA: Diagnosis not present

## 2023-03-17 ENCOUNTER — Encounter: Payer: Medicare HMO | Admitting: Family

## 2023-03-17 ENCOUNTER — Ambulatory Visit: Payer: Medicare HMO | Admitting: Occupational Therapy

## 2023-03-17 NOTE — Progress Notes (Signed)
Erroneous encounter-disregard

## 2023-03-21 ENCOUNTER — Ambulatory Visit
Admission: EM | Admit: 2023-03-21 | Discharge: 2023-03-21 | Disposition: A | Discharge status: Home / Self Care | Payer: Medicare HMO

## 2023-03-21 DIAGNOSIS — M25531 Pain in right wrist: Secondary | ICD-10-CM | POA: Diagnosis not present

## 2023-03-21 NOTE — ED Provider Notes (Signed)
[name]    CSN: [id number] Arrival date & time: 03/21/23  4098      History   Chief Complaint Chief Complaint  Patient presents with   Wrist Pain    HPI MIZRAIM GANSON is a 62 y.o. male.   Patient presents with right wrist pain.  Patient recently had surgery in June for carpal tunnel release.  He states that pain has been increasing since surgery.  He has not notified his surgeon of this.  States that he started physical therapy which they have been trying to help with this but has not been helpful.  He has taken Tylenol for pain.  He was also prescribed narcotic pain medication after surgery which he reports he has taken al of.  Patient also reports that prior to surgery, he had a screw go through his hand which he notified the surgeon of but they were not concerned per patient report.  Also reporting some numbness and tingling.  Denies fever.   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    Anxiety    Arthritis    DDD lumbar, arthritis knees   Complication of anesthesia    pt states he woke up during a vascular procedure   COPD (chronic obstructive pulmonary disease) (HCC)    DJD (degenerative joint disease) of lumbar spine    GERD (gastroesophageal reflux disease)    Hemorrhoid    internal   History of bowel resection 04/19/2017   Hyperlipidemia    Hypertension    Pt states he's never been told or treated for HTN   Illicit drug use    + UDS cocaine, THC, opiates 10/11/21   Inguinal hernia    MSSA bacteremia 10/11/2021   s/p IV cefazolin, po Linezolid; 10/16/21 TEE w/o vegetations.   Peripheral vascular disease (HCC)    Pneumonia    as child, cough at present with no fever   Pneumonia    as child, cough at present with no fever    Radiation 11/19/11-01/08/12   5040 cGy 28 fx Pelvis and inguinal area   [facility])    no weaknes or paralysis    Substance abuse (HCC)    last use was Jan. 2024; patient states he has "been clean" since then    Patient Active Problem List   Diagnosis Date Noted   Carpal tunnel syndrome on right 02/03/2023   Right carpal tunnel syndrome 10/28/2022   Protein-calorie malnutrition, severe 04/29/2022   Cellulitis of right lower extremity 04/28/2022   Sepsis 10/12/2021   Peripheral vascular disease    AKI    Tobacco use    COPD (chronic obstructive pulmonary disease) (HCC)    BPH     Periodontal disease    IVDU, cocaine use    Demand ischemia    Thrombocytopenia    Normocytic anemia    MSSA bacteremia 10/11/2021   Callus 06/12/2021   Hav (hallux abducto valgus), unspecified laterality 06/12/2021   Hammer toes, bilateral 06/12/2021   Atherosclerosis of native arteries of extremity with intermittent claudication (HCC) 06/12/2021   Hyperlipidemia 06/07/2021   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   Elevated blood-pressure reading, without diagnosis of hypertension 09/26/2020   Body mass index (BMI) 29.0-29.9, adult 08/29/2020   Partial small bowel obstruction (HCC) 03/10/2020   Small bowel obstruction due to adhesions (HCC) 03/08/2020  Enteritis 10/15/2019   Abnormal liver function 10/15/2019   Leukocytosis 10/15/2019   Nausea & vomiting 10/15/2019   GAD (generalized anxiety disorder) 05/18/2017   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   Constipation 04/11/2017   Bee sting 04/05/2017   Diverticulosis 11/29/2014   Healthcare maintenance 11/06/2014   Cough 10/05/2014   Insomnia 09/13/2014   Erectile dysfunction 11/30/2013   Chronic pain syndrome 11/30/2013   Depression 11/30/2013   GERD (gastroesophageal reflux disease)    Hypertension    Anxiety    Arthritis    Osteoarthritis of lumbar spine    Radiation    Night sweats 08/12/2012   Tobacco abuse counseling 05/26/2012   Lymphocytic colitis 05/03/2012   Vitamin B12 deficiency 03/19/2012   Folate deficiency 03/19/2012    Inguinal hernia 11/24/2011   Cancer (HCC) 10/14/2011   Internal hemorrhoid 09/11/2011   Anal cancer 10/09/2010    Past Surgical History:  Procedure Laterality Date   APPENDECTOMY     age 48 or 80   CARPAL TUNNEL RELEASE Right 02/04/2023   Procedure: RIGHT CARPAL TUNNEL RELEASE;  Surgeon: Tarry Kos, MD;  Location: [facility];  Service: Orthopedics;  Laterality: Right;   EXAMINATION UNDER ANESTHESIA  10/14/2011   Procedure: EXAM UNDER ANESTHESIA;  Surgeon: Velora Heckler, MD;  Location: [facility];  Service: General;  Laterality: N/A;  exam under anethesia, Excision of mass anal canal, 1.5cm   INGUINAL HERNIA REPAIR  05/11/2012   Procedure: HERNIA REPAIR INGUINAL ADULT;  Surgeon: Velora Heckler, MD;  Location: [facility];  Service: General;  Laterality: Left;  left inguinal hernia repair with mesh   LOWER EXTREMITY ANGIOGRAPHY Left 07/02/2021   Procedure: LOWER EXTREMITY ANGIOGRAPHY;  Surgeon: Renford Dills, MD;  Location: [facility];  Service: Cardiovascular;  Laterality: Left;   LOWER EXTREMITY ANGIOGRAPHY Right 07/16/2021   Procedure: LOWER EXTREMITY ANGIOGRAPHY;  Surgeon: Renford Dills, MD;  Location: [facility];  Service: Cardiovascular;  Laterality: Right;   LOWER EXTREMITY ANGIOGRAPHY Left 08/27/2021   Procedure: LOWER EXTREMITY ANGIOGRAPHY;  Surgeon: Renford Dills, MD;  Location: [facility];  Service: Cardiovascular;  Laterality: Left;   LOWER EXTREMITY ANGIOGRAPHY Left 01/14/2022   Procedure: Lower Extremity Angiography;  Surgeon: Renford Dills, MD;  Location: [facility];  Service: Cardiovascular;  Laterality: Left;   LOWER EXTREMITY ANGIOGRAPHY Left 11/04/2022   Procedure: Lower Extremity Angiography;  Surgeon: Renford Dills, MD;  Location: [facility];  Service: Cardiovascular;  Laterality: Left;   SMALL INTESTINE SURGERY  04/19/2017   exploratory lap, partial small bowel resection  for perforated ulcer/abcess, radiation enteritis   surgical pathology   10/14/2011   squamous cell ca of anus   TEE WITHOUT CARDIOVERSION N/A 10/16/2021   Procedure: TRANSESOPHAGEAL ECHOCARDIOGRAM (TEE);  Surgeon: Chrystie Nose, MD;  Location: [facility];  Service: Cardiovascular;  Laterality: N/A;   TOOTH EXTRACTION N/A 12/04/2021   Procedure: DENTAL RESTORATION/EXTRACTIONS;  Surgeon: Ocie Doyne, DMD;  Location: [facility];  Service: Oral Surgery;  Laterality: N/A;   transanal excision  10/14/2011   Dr.Todd Gerkin       Home Medications    Prior to Admission medications   Medication Sig Start Date End Date Taking? Authorizing Provider  albuterol (VENTOLIN HFA) 108 (90 Base) MCG/ACT inhaler Inhale 2 puffs into the lungs every 6 (six) hours as needed for wheezing or shortness of breath. 07/23/22   Valinda Hoar, NP  atorvastatin (LIPITOR) 10  MG tablet Take 1 tablet (10 mg total) by mouth daily. Additional refills per primary provider 11/04/22 11/04/23  Schnier, Latina Craver, MD  clopidogrel (PLAVIX) 75 MG tablet Take 1 tablet (75 mg total) by mouth daily. 10/07/22   Georgiana Spinner, NP  ELIQUIS 2.5 MG TABS tablet Take 1 tablet by mouth twice daily 03/02/23   Georgiana Spinner, NP  EPINEPHrine (EPIPEN 2-PAK) 0.3 mg/0.3 mL IJ SOAJ injection Inject 0.3 mg (1 pen) into the muscle as needed for anaphylaxis. 12/30/22   Rema Fendt, NP  HYDROcodone-acetaminophen (NORCO) 7.5-325 MG tablet Take 1-2 tablets by mouth daily as needed for moderate pain. Patient not taking: Reported on 03/09/2023 02/03/23   Tarry Kos, MD  METHADONE HCL PO Take 56 mg by mouth daily.    [provider]  Multiple Vitamin (MULTIVITAMIN ADULT PO) Take by mouth daily.    [provider]  mupirocin ointment (BACTROBAN) 2 % Apply 1 Application topically 2 (two) times daily. To affected area till better Patient not taking: Reported on 12/30/2022 12/25/22   Zenia Resides, MD  tamsulosin (FLOMAX) 0.4 MG CAPS  capsule Take 1 capsule (0.4 mg total) by mouth at bedtime. 09/16/22   Georganna Skeans, MD  sucralfate (CARAFATE) 1 GM/10ML suspension Take 10 mLs (1 g total) by mouth 4 (four) times daily -  with meals and at bedtime. Patient not taking: Reported on 11/28/2019 10/21/19 06/13/20  Nicanor Alcon, April, MD    Family History Family History  Problem Relation Age of Onset   Colon cancer Father    Asthma Father    Cancer Father        prostate   Hypertension Father    Asthma Mother    Hypertension Mother    Stomach cancer Neg Hx    Esophageal cancer Neg Hx     Social History Social History   Tobacco Use   Smoking status: Every Day    Current packs/day: 1.00    Average packs/day: 1 pack/day for 47.0 years (47.0 ttl pk-yrs)    Types: Cigarettes   Smokeless tobacco: Never   Tobacco comments:    Cutting back>> Quit Smart card supplied  Vaping Use   Vaping status: Never Used  Substance Use Topics   Alcohol use: Not Currently    Comment: occasional   Drug use: Yes    Types: Marijuana, "Crack" cocaine    Comment: hx cocaine use- states stopped 6 months ago but last positive drug screen 09/2022     Allergies   Aspirin, Yellow jacket venom [bee venom], Codeine, Ibuprofen, and Morphine and codeine   Review of Systems Review of Systems Per HPI  Physical Exam Triage Vital Signs ED Triage Vitals  Encounter Vitals Group     BP 03/21/23 0939 (!) 151/66     Systolic BP Percentile --      Diastolic BP Percentile --      Pulse Rate 03/21/23 0939 79     Resp 03/21/23 0939 16     Temp 03/21/23 0939 98.2 F (36.8 C)     Temp Source 03/21/23 0939 Oral     SpO2 03/21/23 0939 96 %     Weight --      Height --      Head Circumference --      Peak Flow --      Pain Score 03/21/23 0940 10     Pain Loc --      Pain Education --  Exclude from Growth Chart --    No data found.  Updated Vital Signs BP (!) 151/66 (BP Location: Left Arm)   Pulse 79   Temp 98.2 F (36.8 C) (Oral)   Resp  16   SpO2 96%   Visual Acuity Right Eye Distance:   Left Eye Distance:   Bilateral Distance:    Right Eye Near:   Left Eye Near:    Bilateral Near:     Physical Exam Constitutional:      General: He is not in acute distress.    Appearance: Normal appearance. He is not toxic-appearing or diaphoretic.  HENT:     Head: Normocephalic and atraumatic.  Eyes:     Extraocular Movements: Extraocular movements intact.     Conjunctiva/sclera: Conjunctivae normal.  Pulmonary:     Effort: Pulmonary effort is normal.  Musculoskeletal:     Comments: Patient reports tenderness to palpation to volar aspect of wrist that extends slightly into the palmar aspect of the hand.  Mild redness noted.  Mild swelling noted.  Patient has full range of motion of risk.  Grip strength is 5/5.  Capillary refill and pulses intact.  No abrasions or lacerations noted.  Neurological:     General: No focal deficit present.     Mental Status: He is alert and oriented to person, place, and time. Mental status is at baseline.  Psychiatric:        Mood and Affect: Mood normal.        Behavior: Behavior normal.        Thought Content: Thought content normal.        Judgment: Judgment normal.      UC Treatments / Results  Labs (all labs ordered are listed, but only abnormal results are displayed) Labs Reviewed - No data to display  EKG   Radiology No results found.  Procedures Procedures (including critical care time)  Medications Ordered in UC Medications - No data to display  Initial Impression / Assessment and Plan / UC Course  I have reviewed the triage vital signs and the nursing notes.  Pertinent labs & imaging results that were available during my care of the patient were reviewed by me and considered in my medical decision making (see chart for details).     Given recent surgery and increased intensity in pain, recommend that he see orthopedist today for further evaluation and management.   Patient needs to ensure that there are no complications from surgery.  Recommended that he go to either [facility] walk-in urgent care or [facility] orthopedic urgent care today as they are currently open.  He was agreeable with this plan.  Patient requesting pain medicine to be administered in urgent care but do not have anything available at this urgent care location.  Patient was agreeable to going to walk-in urgent care and address was provided for patient.  Vital signs stable at discharge.  Agree with patient self transport to orthopedic urgent care. Final Clinical Impressions(s) / UC Diagnoses   Final diagnoses:  Right wrist pain     Discharge Instructions      Please go to one of the above orthopedic urgent cares for further evaluation and management today.    ED Prescriptions   None    PDMP not reviewed this encounter.   Gustavus Bryant, [place] 03/21/23 1008

## 2023-03-21 NOTE — Discharge Instructions (Addendum)
Please go to one of the above orthopedic urgent cares for further evaluation and management today.

## 2023-03-21 NOTE — ED Triage Notes (Signed)
Pt states right wrist pain.  States he had surgery on it on last month and just started physical therapy last week.  States it now swollen and painful.  Has been taking Tylenol at home with no relief.

## 2023-03-23 ENCOUNTER — Telehealth: Payer: Self-pay

## 2023-03-23 ENCOUNTER — Ambulatory Visit: Payer: Medicare HMO | Admitting: Occupational Therapy

## 2023-03-23 DIAGNOSIS — R600 Localized edema: Secondary | ICD-10-CM | POA: Diagnosis not present

## 2023-03-23 DIAGNOSIS — M6281 Muscle weakness (generalized): Secondary | ICD-10-CM | POA: Diagnosis not present

## 2023-03-23 DIAGNOSIS — M79641 Pain in right hand: Secondary | ICD-10-CM | POA: Diagnosis not present

## 2023-03-23 DIAGNOSIS — R208 Other disturbances of skin sensation: Secondary | ICD-10-CM | POA: Diagnosis not present

## 2023-03-23 DIAGNOSIS — M25551 Pain in right hip: Secondary | ICD-10-CM | POA: Diagnosis not present

## 2023-03-23 DIAGNOSIS — M79601 Pain in right arm: Secondary | ICD-10-CM | POA: Diagnosis not present

## 2023-03-23 DIAGNOSIS — F1120 Opioid dependence, uncomplicated: Secondary | ICD-10-CM | POA: Diagnosis not present

## 2023-03-23 NOTE — Telephone Encounter (Signed)
OV scheduled for patient. LMVM on triage phone stating he had fallen and re-injured wrist that he just had surgery on.

## 2023-03-23 NOTE — Therapy (Signed)
OUTPATIENT OCCUPATIONAL THERAPY ORTHO TREATMENT  Patient Name: Martin [name] MRN: [id number] DOB:1960/09/14, 62 y.o., male Today's Date: 03/23/2023  PCP: Does not have one REFERRING PROVIDER: Tarry Kos, MD  END OF SESSION:  OT End of Session - 03/23/23 1623     Visit Number 3    Number of Visits 13    Date for OT Re-Evaluation 04/24/23    Authorization Type [employer] primary; Medicaid secondary - auth approved    Authorization Time Period 03/09/2023 - 04/14/2023    Authorization - Number of Visits 12    Progress Note Due on Visit 10    OT Start Time 1621    OT Stop Time 1700    OT Time Calculation (min) 39 min    Activity Tolerance Patient tolerated treatment well    Behavior During Therapy [facility] for tasks assessed/performed             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    Anxiety    Arthritis    DDD lumbar, arthritis knees   Complication of anesthesia    pt states he woke up during a vascular procedure   COPD (chronic obstructive pulmonary disease) (HCC)    DJD (degenerative joint disease) of lumbar spine    GERD (gastroesophageal reflux disease)    Hemorrhoid    internal   History of bowel resection 04/19/2017   Hyperlipidemia    Hypertension    Pt states he's never been told or treated for HTN   Illicit drug use    + UDS cocaine, THC, opiates 10/11/21   Inguinal hernia    MSSA bacteremia 10/11/2021   s/p IV cefazolin, po Linezolid; 10/16/21 TEE w/o vegetations.   Peripheral vascular disease (HCC)    Pneumonia    as child, cough at present with no fever   Pneumonia    as child, cough at present with no fever    Radiation 11/19/11-01/08/12   5040 cGy 28 fx Pelvis and inguinal area   Stroke [facility])    no weaknes or paralysis   Substance abuse (HCC)    last use was Jan. 2024; patient states he has "been clean" since then   Past Surgical History:   Procedure Laterality Date   APPENDECTOMY     age 76 or 17   CARPAL TUNNEL RELEASE Right 02/04/2023   Procedure: RIGHT CARPAL TUNNEL RELEASE;  Surgeon: Tarry Kos, MD;  Location: [facility];  Service: Orthopedics;  Laterality: Right;   EXAMINATION UNDER ANESTHESIA  10/14/2011   Procedure: EXAM UNDER ANESTHESIA;  Surgeon: Velora Heckler, MD;  Location: [facility];  Service: General;  Laterality: N/A;  exam under anethesia, Excision of mass anal canal, 1.5cm   INGUINAL HERNIA REPAIR  05/11/2012   Procedure: HERNIA REPAIR INGUINAL ADULT;  Surgeon: Velora Heckler, MD;  Location: [facility];  Service: General;  Laterality: Left;  left inguinal hernia repair with mesh   LOWER EXTREMITY ANGIOGRAPHY Left 07/02/2021   Procedure: LOWER EXTREMITY ANGIOGRAPHY;  Surgeon: Renford Dills, MD;  Location: [facility];  Service: Cardiovascular;  Laterality: Left;   LOWER EXTREMITY ANGIOGRAPHY Right 07/16/2021   Procedure: LOWER EXTREMITY ANGIOGRAPHY;  Surgeon: Renford Dills, MD;  Location: [facility];  Service: Cardiovascular;  Laterality: Right;   LOWER EXTREMITY ANGIOGRAPHY Left 08/27/2021   Procedure: LOWER EXTREMITY  ANGIOGRAPHY;  Surgeon: Renford Dills, MD;  Location: [facility];  Service: Cardiovascular;  Laterality: Left;   LOWER EXTREMITY ANGIOGRAPHY Left 01/14/2022   Procedure: Lower Extremity Angiography;  Surgeon: Renford Dills, MD;  Location: [facility];  Service: Cardiovascular;  Laterality: Left;   LOWER EXTREMITY ANGIOGRAPHY Left 11/04/2022   Procedure: Lower Extremity Angiography;  Surgeon: Renford Dills, MD;  Location: [facility];  Service: Cardiovascular;  Laterality: Left;   SMALL INTESTINE SURGERY  04/19/2017   exploratory lap, partial small bowel resection for perforated ulcer/abcess, radiation enteritis   surgical pathology   10/14/2011   squamous cell ca of anus   TEE WITHOUT CARDIOVERSION N/A  10/16/2021   Procedure: TRANSESOPHAGEAL ECHOCARDIOGRAM (TEE);  Surgeon: Chrystie Nose, MD;  Location: [facility];  Service: Cardiovascular;  Laterality: N/A;   TOOTH EXTRACTION N/A 12/04/2021   Procedure: DENTAL RESTORATION/EXTRACTIONS;  Surgeon: Ocie Doyne, DMD;  Location: [facility];  Service: Oral Surgery;  Laterality: N/A;   transanal excision  10/14/2011   Dr.Todd Gerkin   Patient Active Problem List   Diagnosis Date Noted   Carpal tunnel syndrome on right 02/03/2023   Right carpal tunnel syndrome 10/28/2022   Protein-calorie malnutrition, severe 04/29/2022   Cellulitis of right lower extremity 04/28/2022   Sepsis 10/12/2021   Peripheral vascular disease    AKI    Tobacco use    COPD (chronic obstructive pulmonary disease) (HCC)    BPH     Periodontal disease    IVDU, cocaine use    Demand ischemia    Thrombocytopenia    Normocytic anemia    MSSA bacteremia 10/11/2021   Callus 06/12/2021   Hav (hallux abducto valgus), unspecified laterality 06/12/2021   Hammer toes, bilateral 06/12/2021   Atherosclerosis of native arteries of extremity with intermittent claudication (HCC) 06/12/2021   Hyperlipidemia 06/07/2021   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   Elevated blood-pressure reading, without diagnosis of hypertension 09/26/2020   Body mass index (BMI) 29.0-29.9, adult 08/29/2020   Partial small bowel obstruction (HCC) 03/10/2020   Small bowel obstruction due to adhesions (HCC) 03/08/2020   Enteritis 10/15/2019   Abnormal liver function 10/15/2019   Leukocytosis 10/15/2019   Nausea & vomiting 10/15/2019   GAD (generalized anxiety disorder) 05/18/2017   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   Constipation 04/11/2017   Bee sting 04/05/2017   Diverticulosis 11/29/2014   Healthcare maintenance 11/06/2014   Cough 10/05/2014   Insomnia 09/13/2014   Erectile dysfunction 11/30/2013   Chronic pain syndrome 11/30/2013   Depression 11/30/2013   GERD  (gastroesophageal reflux disease)    Hypertension    Anxiety    Arthritis    Osteoarthritis of lumbar spine    Radiation    Night sweats 08/12/2012   Tobacco abuse counseling 05/26/2012   Lymphocytic colitis 05/03/2012   Vitamin B12 deficiency 03/19/2012   Folate deficiency 03/19/2012   Inguinal hernia 11/24/2011   Cancer (HCC) 10/14/2011   Internal hemorrhoid 09/11/2011   Anal cancer 10/09/2010    ONSET DATE: surgery date 02-04-23  REFERRING DIAG: M79.601 (ICD-10-CM) - Right arm pain  THERAPY DIAG:  Pain in right hand  Muscle weakness (generalized)  Other disturbances of skin sensation  Localized edema  Rationale for Evaluation and Treatment: Rehabilitation  SUBJECTIVE:   SUBJECTIVE STATEMENT: Pt states he had a fall last week and fell on his surgical hand. He has never been able to get through to [facility] to schedule follow-up. He was advised to go  to a walk-in ortho center but was turned away. He has been in extreme pain.   Pt able to call [facility] and left message with triage, which was later returned during his therapy visit. He is to see them tomorrow at 10:15 am though he states his employer will not be happy he will have to miss work. He knows he needs to have a follow-up and will go to this appointment.   Pt accompanied by: self  PERTINENT HISTORY: "POST OP/RIGHT CTR (surgery date 02-04-23) Recommend that he wear the removable splint for another couple weeks at night.  Wean as tolerated.  Recheck in 4 weeks.   Follow-Up Instructions: 7/16 at 10:15 am  PRECAUTIONS: None  WEIGHT BEARING RESTRICTIONS: No  PAIN:  Are you having pain? Yes: NPRS scale: 10/10 Pain location: R wrist Pain description: burning Aggravating factors: moving it Relieving factors: rest; medication  Faces scale more consistent with 8/10; however, Pain improving to 4/10 with fluido  FALLS: Has patient fallen in last 6 months? No  LIVING ENVIRONMENT: Lives with: lives alone Lives  in: House/apartment Stairs: Yes: External: 2 steps; on right going up Has following equipment at home: None  PLOF: Independent; driving; on disability  PATIENT GOALS: improve functional use of RUE  NEXT MD VISIT: not yet scheduled  OBJECTIVE:   HAND DOMINANCE: Right  ADLs: Overall ADLs: mod I  FUNCTIONAL OUTCOME MEASURES: Quick Dash: 45.5% disability with use of RUE  UPPER EXTREMITY ROM:     AROM Right (eval) Left (eval)  Shoulder flexion  WNL  Shoulder abduction  WNL  Elbow flexion  WNL  Elbow extension  WNL  Wrist flexion 52* WNL  Wrist extension 65* WNL  Wrist pronation  WNL  Wrist supination WFL WNL   Digit Composite Flexion  WNL  Digit Composite Extension  WNL  Digit Opposition Full but tightness and painful WNL  (Blank rows = not tested)  UPPER EXTREMITY MMT:     BUE WNL for shoulders and elbows  HAND FUNCTION: Grip strength: Right: 42.9 with pain lbs; Left: 85.9 lbs  COORDINATION: 9 Hole Peg test: Right: 27.8 sec; Left: 27 sec  SENSATION: Paresthesias reported in digits 3 and 4   EDEMA: moderate swelling reported and observed.   COGNITION: Overall cognitive status: Within functional limits for tasks assessed  OBSERVATIONS: Pt appears fairly well-kept. Glasses donned. Moderate swelling and scar tissues appreciable to R palm at area of incision.    TODAY'S TREATMENT:        - Manual therapy completed for duration as noted below including:  Therapist completed IASTM using edge mobility tool at site of incision using free up lotion as emollient for reduction of scar tissue and to promote improved AROM and pain reduction of affected extremity. Improved appearance to incision noted upon completion with less palpable adhesions.                                                                                                            - Therapeutic exercises completed for duration as noted  below including:  Pt placed BUE in Fluidotherapy machine with  supervised ROM x 7 min. Pt was educated to complete RUE PROM during modality time to improve ROM and decrease pain/stiffness of affected extremity by use of the machine's massaging action and thermal properties.   OT Reviewed RUE HEP. Pt requiring mod cueing for proper execution.  - Ultrasound completed for duration as noted below including:  Ultrasound applied to palmar right hand and wrist for 8 minutes, frequency of 3 MHz, 20% duty cycle, and 1.1 W/cm with pt's arm placed on soft towel for promotion of ROM, edema reduction, and pain reduction in affected extremity.   PATIENT EDUCATION: Education details: reviewed contrast baths, sleep positioning, scar tissue massage; ROM HEP; [place] Person educated: Patient Education method: Explanation and Handouts Education comprehension: verbalized understanding and needs further education  HOME EXERCISE PROGRAM: 03/09/2023: contrast baths, sleep positioning, scar tissue massage  03/13/2023: Access Code: [email or web address] URL: [email or web address]  GOALS:  SHORT TERM GOALS: Target date: 04/07/2023    Patient will demonstrate independence with  HEP with 25% verbal cues or less for proper execution. Baseline: Goal status: INITIAL  2.  Pt will be independent with contrast bath and scar tissue massage as needed for pain and edema management.  Baseline:  Goal status: INITIAL   LONG TERM GOALS: Target date: 04/24/2023  Patient will demonstrate updated RUE HEP with 25% verbal cues or less for proper execution. Baseline:  Goal status: INITIAL  2.  Patient will demonstrate at least 16% improvement with quick Dash score (reporting 29.5 % disability or less) indicating improved functional use of affected extremity. Baseline: 45.5% disability with use of RUE  Goal status: INITIAL  3.  Patient will demonstrate at least 55 lbs R grip strength as needed to open jars and other containers without pain at testing. Baseline: 42.9 lbs with reported pain  during testing Goal status: INITIAL   ASSESSMENT:  CLINICAL IMPRESSION:  Pt presentation not indicative of fracture but continues to present as soft tissue injury/scar tissue as typically associated with surgical intervention and reported fall. Will monitor.  PERFORMANCE DEFICITS: in functional skills including ADLs, IADLs, sensation, edema, tone, ROM, strength, pain, fascial restrictions, and UE functional use.   IMPAIRMENTS: are limiting patient from ADLs, IADLs, and work.   COMORBIDITIES: may have co-morbidities  that affects occupational performance. Patient will benefit from skilled OT to address above impairments and improve overall function.  REHAB POTENTIAL: Fair given comorbidities  PLAN:  OT FREQUENCY: 2x/week  OT DURATION: 6 weeks  PLANNED INTERVENTIONS: self care/ADL training, therapeutic exercise, therapeutic activity, neuromuscular re-education, manual therapy, scar mobilization, passive range of motion, splinting, ultrasound, paraffin, fluidotherapy, moist heat, contrast bath, patient/family education, DME and/or AE instructions, and Re-evaluation  RECOMMENDED OTHER SERVICES: none at this time  CONSULTED AND AGREED WITH PLAN OF CARE: Patient  PLAN FOR NEXT SESSION: initiate putty; [place]   Delana Meyer, OT 03/23/2023, 5:08 PM

## 2023-03-24 ENCOUNTER — Encounter: Payer: Self-pay | Admitting: Orthopaedic Surgery

## 2023-03-24 ENCOUNTER — Other Ambulatory Visit (INDEPENDENT_AMBULATORY_CARE_PROVIDER_SITE_OTHER): Payer: Medicare HMO

## 2023-03-24 ENCOUNTER — Ambulatory Visit: Payer: Medicare HMO | Admitting: Orthopaedic Surgery

## 2023-03-24 DIAGNOSIS — M25531 Pain in right wrist: Secondary | ICD-10-CM | POA: Diagnosis not present

## 2023-03-24 NOTE — Progress Notes (Signed)
Office Visit Note   Patient: Martin [name]           Date of Birth: 1961/05/20           MRN: [id number] Visit Date: 03/24/2023              Requested by: Rema Fendt, NP [id number] [place],  [place] [address] PCP: Rema Fendt, NP   Assessment & Plan: Visit Diagnoses:  1. Pain in right wrist     Plan: Impression is soft tissue contusion to the base of the right hand.  Recommend symptomatic treatment.  Activity as tolerated.  Follow-up as needed.  Follow-Up Instructions: No follow-ups on file.   Orders:  Orders Placed This Encounter  Procedures   XR Wrist Complete Right   No orders of the defined types were placed in this encounter.     Procedures: No procedures performed   Clinical Data: No additional findings.   Subjective: Chief Complaint  Patient presents with   Right Wrist - Pain    HPI Martin [name] comes in today for recheck on his right wrist.  He is slipped last week and caught himself with the right hand.  He feels a knot at the base of the thenar eminence. Review of Systems   Objective: Vital Signs: There were no vitals taken for this visit.  Physical Exam  Ortho Exam Examination right hand shows fully healed surgical scar.  No abnormal or concerning features.  No focal abnormalities.  He mainly has some tenderness to the surgical scar and pillar pain. Specialty Comments:  No specialty comments available.  Imaging: XR Wrist Complete Right  Result Date: 03/24/2023 X-rays demonstrate no acute or structural abnormalities    PMFS History: Patient Active Problem List   Diagnosis Date Noted   Carpal tunnel syndrome on right 02/03/2023   Right carpal tunnel syndrome 10/28/2022   Protein-calorie malnutrition, severe 04/29/2022   Cellulitis of right lower extremity 04/28/2022   Sepsis 10/12/2021   Peripheral vascular disease    AKI    Tobacco use    COPD (chronic obstructive pulmonary disease) (HCC)    BPH      Periodontal disease    IVDU, cocaine use    Demand ischemia    Thrombocytopenia    Normocytic anemia    MSSA bacteremia 10/11/2021   Callus 06/12/2021   Hav (hallux abducto valgus), unspecified laterality 06/12/2021   Hammer toes, bilateral 06/12/2021   Atherosclerosis of native arteries of extremity with intermittent claudication (HCC) 06/12/2021   Hyperlipidemia 06/07/2021   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   Elevated blood-pressure reading, without diagnosis of hypertension 09/26/2020   Body mass index (BMI) 29.0-29.9, adult 08/29/2020   Partial small bowel obstruction (HCC) 03/10/2020   Small bowel obstruction due to adhesions (HCC) 03/08/2020   Enteritis 10/15/2019   Abnormal liver function 10/15/2019   Leukocytosis 10/15/2019   Nausea & vomiting 10/15/2019   GAD (generalized anxiety disorder) 05/18/2017   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   Constipation 04/11/2017   Bee sting 04/05/2017   Diverticulosis 11/29/2014   Healthcare maintenance 11/06/2014   Cough 10/05/2014   Insomnia 09/13/2014   Erectile dysfunction 11/30/2013   Chronic pain syndrome 11/30/2013   Depression 11/30/2013   GERD (gastroesophageal reflux disease)    Hypertension    Anxiety    Arthritis    Osteoarthritis of lumbar spine    Radiation    Night sweats 08/12/2012   Tobacco abuse counseling 05/26/2012  Lymphocytic colitis 05/03/2012   Vitamin B12 deficiency 03/19/2012   Folate deficiency 03/19/2012   Inguinal hernia 11/24/2011   Cancer (HCC) 10/14/2011   Internal hemorrhoid 09/11/2011   Anal cancer 10/09/2010   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    Anxiety    Arthritis    DDD lumbar, arthritis knees   Complication of anesthesia    pt states he woke up during a vascular procedure   COPD (chronic obstructive pulmonary disease) (HCC)    DJD (degenerative joint disease) of  lumbar spine    GERD (gastroesophageal reflux disease)    Hemorrhoid    internal   History of bowel resection 04/19/2017   Hyperlipidemia    Hypertension    Pt states he's never been told or treated for HTN   Illicit drug use    + UDS cocaine, THC, opiates 10/11/21   Inguinal hernia    MSSA bacteremia 10/11/2021   s/p IV cefazolin, po Linezolid; 10/16/21 TEE w/o vegetations.   Peripheral vascular disease (HCC)    Pneumonia    as child, cough at present with no fever   Pneumonia    as child, cough at present with no fever    Radiation 11/19/11-01/08/12   5040 cGy 28 fx Pelvis and inguinal area   Stroke [facility])    no weaknes or paralysis   Substance abuse (HCC)    last use was Jan. 2024; patient states he has "been clean" since then    Family History  Problem Relation Age of Onset   Colon cancer Father    Asthma Father    Cancer Father        prostate   Hypertension Father    Asthma Mother    Hypertension Mother    Stomach cancer Neg Hx    Esophageal cancer Neg Hx     Past Surgical History:  Procedure Laterality Date   APPENDECTOMY     age 18 or 30   CARPAL TUNNEL RELEASE Right 02/04/2023   Procedure: RIGHT CARPAL TUNNEL RELEASE;  Surgeon: Tarry Kos, MD;  Location: [facility];  Service: Orthopedics;  Laterality: Right;   EXAMINATION UNDER ANESTHESIA  10/14/2011   Procedure: EXAM UNDER ANESTHESIA;  Surgeon: Velora Heckler, MD;  Location: [facility];  Service: General;  Laterality: N/A;  exam under anethesia, Excision of mass anal canal, 1.5cm   INGUINAL HERNIA REPAIR  05/11/2012   Procedure: HERNIA REPAIR INGUINAL ADULT;  Surgeon: Velora Heckler, MD;  Location: [facility];  Service: General;  Laterality: Left;  left inguinal hernia repair with mesh   LOWER EXTREMITY ANGIOGRAPHY Left 07/02/2021   Procedure: LOWER EXTREMITY ANGIOGRAPHY;  Surgeon: Renford Dills, MD;  Location: [facility];  Service: Cardiovascular;  Laterality: Left;   LOWER  EXTREMITY ANGIOGRAPHY Right 07/16/2021   Procedure: LOWER EXTREMITY ANGIOGRAPHY;  Surgeon: Renford Dills, MD;  Location: [facility];  Service: Cardiovascular;  Laterality: Right;   LOWER EXTREMITY ANGIOGRAPHY Left 08/27/2021   Procedure: LOWER EXTREMITY ANGIOGRAPHY;  Surgeon: Renford Dills, MD;  Location: [facility];  Service: Cardiovascular;  Laterality: Left;   LOWER EXTREMITY ANGIOGRAPHY Left 01/14/2022   Procedure: Lower Extremity Angiography;  Surgeon: Renford Dills, MD;  Location: [facility];  Service: Cardiovascular;  Laterality: Left;   LOWER EXTREMITY ANGIOGRAPHY Left 11/04/2022  Procedure: Lower Extremity Angiography;  Surgeon: Renford Dills, MD;  Location: [facility];  Service: Cardiovascular;  Laterality: Left;   SMALL INTESTINE SURGERY  04/19/2017   exploratory lap, partial small bowel resection for perforated ulcer/abcess, radiation enteritis   surgical pathology   10/14/2011   squamous cell ca of anus   TEE WITHOUT CARDIOVERSION N/A 10/16/2021   Procedure: TRANSESOPHAGEAL ECHOCARDIOGRAM (TEE);  Surgeon: Chrystie Nose, MD;  Location: [facility]OSCOPY;  Service: Cardiovascular;  Laterality: N/A;   TOOTH EXTRACTION N/A 12/04/2021   Procedure: DENTAL RESTORATION/EXTRACTIONS;  Surgeon: Ocie Doyne, DMD;  Location: [facility];  Service: Oral Surgery;  Laterality: N/A;   transanal excision  10/14/2011   Dr.Todd Gerkin   Social History   Occupational History   Occupation: unemployed  Tobacco Use   Smoking status: Every Day    Current packs/day: 1.00    Average packs/day: 1 pack/day for 47.0 years (47.0 ttl pk-yrs)    Types: Cigarettes   Smokeless tobacco: Never   Tobacco comments:    Cutting back>> Quit Smart card supplied  Vaping Use   Vaping status: Never Used  Substance and Sexual Activity   Alcohol use: Not Currently    Comment: occasional   Drug use: Yes    Types: Marijuana, "Crack" cocaine    Comment: hx cocaine  use- states stopped 6 months ago but last positive drug screen 09/2022   Sexual activity: Yes    Birth control/protection: Condom

## 2023-03-25 ENCOUNTER — Ambulatory Visit: Payer: Medicare HMO | Admitting: Occupational Therapy

## 2023-03-25 DIAGNOSIS — R600 Localized edema: Secondary | ICD-10-CM

## 2023-03-25 DIAGNOSIS — M6281 Muscle weakness (generalized): Secondary | ICD-10-CM

## 2023-03-25 DIAGNOSIS — M25551 Pain in right hip: Secondary | ICD-10-CM | POA: Diagnosis not present

## 2023-03-25 DIAGNOSIS — M79641 Pain in right hand: Secondary | ICD-10-CM

## 2023-03-25 DIAGNOSIS — R208 Other disturbances of skin sensation: Secondary | ICD-10-CM | POA: Diagnosis not present

## 2023-03-25 DIAGNOSIS — M79601 Pain in right arm: Secondary | ICD-10-CM | POA: Diagnosis not present

## 2023-03-25 NOTE — Therapy (Signed)
OUTPATIENT OCCUPATIONAL THERAPY ORTHO TREATMENT  Patient Name: Martin [name] MRN: [id number] DOB:07-08-1961, 62 y.o., male Today's Date: 03/25/2023  PCP: Does not have one REFERRING PROVIDER: Tarry Kos, MD  END OF SESSION:  OT End of Session - 03/25/23 1537     Visit Number 4    Number of Visits 13    Date for OT Re-Evaluation 04/24/23    Authorization Type [employer] primary; Medicaid secondary - auth approved    Authorization Time Period 03/09/2023 - 04/14/2023    Authorization - Number of Visits 12    Progress Note Due on Visit 10    OT Start Time 1537    OT Stop Time 1615    OT Time Calculation (min) 38 min    Activity Tolerance Patient tolerated treatment well    Behavior During Therapy [facility] for tasks assessed/performed             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    Anxiety    Arthritis    DDD lumbar, arthritis knees   Complication of anesthesia    pt states he woke up during a vascular procedure   COPD (chronic obstructive pulmonary disease) (HCC)    DJD (degenerative joint disease) of lumbar spine    GERD (gastroesophageal reflux disease)    Hemorrhoid    internal   History of bowel resection 04/19/2017   Hyperlipidemia    Hypertension    Pt states he's never been told or treated for HTN   Illicit drug use    + UDS cocaine, THC, opiates 10/11/21   Inguinal hernia    MSSA bacteremia 10/11/2021   s/p IV cefazolin, po Linezolid; 10/16/21 TEE w/o vegetations.   Peripheral vascular disease (HCC)    Pneumonia    as child, cough at present with no fever   Pneumonia    as child, cough at present with no fever    Radiation 11/19/11-01/08/12   5040 cGy 28 fx Pelvis and inguinal area   Stroke [facility])    no weaknes or paralysis   Substance abuse (HCC)    last use was Jan. 2024; patient states he has "been clean" since then   Past Surgical History:   Procedure Laterality Date   APPENDECTOMY     age 62 or 30   CARPAL TUNNEL RELEASE Right 02/04/2023   Procedure: RIGHT CARPAL TUNNEL RELEASE;  Surgeon: Tarry Kos, MD;  Location: [facility];  Service: Orthopedics;  Laterality: Right;   EXAMINATION UNDER ANESTHESIA  10/14/2011   Procedure: EXAM UNDER ANESTHESIA;  Surgeon: Velora Heckler, MD;  Location: [facility];  Service: General;  Laterality: N/A;  exam under anethesia, Excision of mass anal canal, 1.5cm   INGUINAL HERNIA REPAIR  05/11/2012   Procedure: HERNIA REPAIR INGUINAL ADULT;  Surgeon: Velora Heckler, MD;  Location: [facility];  Service: General;  Laterality: Left;  left inguinal hernia repair with mesh   LOWER EXTREMITY ANGIOGRAPHY Left 07/02/2021   Procedure: LOWER EXTREMITY ANGIOGRAPHY;  Surgeon: Renford Dills, MD;  Location: [facility];  Service: Cardiovascular;  Laterality: Left;   LOWER EXTREMITY ANGIOGRAPHY Right 07/16/2021   Procedure: LOWER EXTREMITY ANGIOGRAPHY;  Surgeon: Renford Dills, MD;  Location: [facility];  Service: Cardiovascular;  Laterality: Right;   LOWER EXTREMITY ANGIOGRAPHY Left 08/27/2021   Procedure: LOWER EXTREMITY  ANGIOGRAPHY;  Surgeon: Renford Dills, MD;  Location: [facility];  Service: Cardiovascular;  Laterality: Left;   LOWER EXTREMITY ANGIOGRAPHY Left 01/14/2022   Procedure: Lower Extremity Angiography;  Surgeon: Renford Dills, MD;  Location: [facility];  Service: Cardiovascular;  Laterality: Left;   LOWER EXTREMITY ANGIOGRAPHY Left 11/04/2022   Procedure: Lower Extremity Angiography;  Surgeon: Renford Dills, MD;  Location: [facility];  Service: Cardiovascular;  Laterality: Left;   SMALL INTESTINE SURGERY  04/19/2017   exploratory lap, partial small bowel resection for perforated ulcer/abcess, radiation enteritis   surgical pathology   10/14/2011   squamous cell ca of anus   TEE WITHOUT CARDIOVERSION N/A  10/16/2021   Procedure: TRANSESOPHAGEAL ECHOCARDIOGRAM (TEE);  Surgeon: Chrystie Nose, MD;  Location: [facility];  Service: Cardiovascular;  Laterality: N/A;   TOOTH EXTRACTION N/A 12/04/2021   Procedure: DENTAL RESTORATION/EXTRACTIONS;  Surgeon: Ocie Doyne, DMD;  Location: [facility];  Service: Oral Surgery;  Laterality: N/A;   transanal excision  10/14/2011   Dr.Todd Gerkin   Patient Active Problem List   Diagnosis Date Noted   Carpal tunnel syndrome on right 02/03/2023   Right carpal tunnel syndrome 10/28/2022   Protein-calorie malnutrition, severe 04/29/2022   Cellulitis of right lower extremity 04/28/2022   Sepsis 10/12/2021   Peripheral vascular disease    AKI    Tobacco use    COPD (chronic obstructive pulmonary disease) (HCC)    BPH     Periodontal disease    IVDU, cocaine use    Demand ischemia    Thrombocytopenia    Normocytic anemia    MSSA bacteremia 10/11/2021   Callus 06/12/2021   Hav (hallux abducto valgus), unspecified laterality 06/12/2021   Hammer toes, bilateral 06/12/2021   Atherosclerosis of native arteries of extremity with intermittent claudication (HCC) 06/12/2021   Hyperlipidemia 06/07/2021   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   Elevated blood-pressure reading, without diagnosis of hypertension 09/26/2020   Body mass index (BMI) 29.0-29.9, adult 08/29/2020   Partial small bowel obstruction (HCC) 03/10/2020   Small bowel obstruction due to adhesions (HCC) 03/08/2020   Enteritis 10/15/2019   Abnormal liver function 10/15/2019   Leukocytosis 10/15/2019   Nausea & vomiting 10/15/2019   GAD (generalized anxiety disorder) 05/18/2017   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   Constipation 04/11/2017   Bee sting 04/05/2017   Diverticulosis 11/29/2014   Healthcare maintenance 11/06/2014   Cough 10/05/2014   Insomnia 09/13/2014   Erectile dysfunction 11/30/2013   Chronic pain syndrome 11/30/2013   Depression 11/30/2013   GERD  (gastroesophageal reflux disease)    Hypertension    Anxiety    Arthritis    Osteoarthritis of lumbar spine    Radiation    Night sweats 08/12/2012   Tobacco abuse counseling 05/26/2012   Lymphocytic colitis 05/03/2012   Vitamin B12 deficiency 03/19/2012   Folate deficiency 03/19/2012   Inguinal hernia 11/24/2011   Cancer (HCC) 10/14/2011   Internal hemorrhoid 09/11/2011   Anal cancer 10/09/2010    ONSET DATE: surgery date 02-04-23  REFERRING DIAG: M79.601 (ICD-10-CM) - Right arm pain  THERAPY DIAG:  Pain in right hand  Muscle weakness (generalized)  Other disturbances of skin sensation  Localized edema  Rationale for Evaluation and Treatment: Rehabilitation  SUBJECTIVE:   SUBJECTIVE STATEMENT: Pt states he went to orthopedist and they confirmed no fracture. They states he only needs to come back as needed.   Pt accompanied by: self  PERTINENT HISTORY: "POST OP/RIGHT  CTR (surgery date 02-04-23) Recommend that he wear the removable splint for another couple weeks at night.  Wean as tolerated.  Recheck in 4 weeks.   Follow-Up Instructions: Return in about 4 weeks (around 03/13/2023)."   PRECAUTIONS: None  WEIGHT BEARING RESTRICTIONS: No  PAIN:  Are you having pain? Yes: NPRS scale: 10/10 Pain location: R wrist Pain description: burning Aggravating factors: moving it Relieving factors: rest; medication  Pain improving to 4/10 with treatment  FALLS: Has patient fallen in last 6 months? No  LIVING ENVIRONMENT: Lives with: lives alone Lives in: House/apartment Stairs: Yes: External: 2 steps; on right going up Has following equipment at home: None  PLOF: Independent; driving; on disability  PATIENT GOALS: improve functional use of RUE  NEXT MD VISIT: not yet scheduled  OBJECTIVE:   HAND DOMINANCE: Right  ADLs: Overall ADLs: mod I  FUNCTIONAL OUTCOME MEASURES: Quick Dash: 45.5% disability with use of RUE  UPPER EXTREMITY ROM:     AROM  Right (eval) Left (eval)  Shoulder flexion  WNL  Shoulder abduction  WNL  Elbow flexion  WNL  Elbow extension  WNL  Wrist flexion 52* WNL  Wrist extension 65* WNL  Wrist pronation  WNL  Wrist supination WFL WNL   Digit Composite Flexion  WNL  Digit Composite Extension  WNL  Digit Opposition Full but tightness and painful WNL  (Blank rows = not tested)  UPPER EXTREMITY MMT:     BUE WNL for shoulders and elbows  HAND FUNCTION: Grip strength: Right: 42.9 with pain lbs; Left: 85.9 lbs  COORDINATION: 9 Hole Peg test: Right: 27.8 sec; Left: 27 sec  SENSATION: Paresthesias reported in digits 3 and 4   EDEMA: moderate swelling reported and observed.   COGNITION: Overall cognitive status: Within functional limits for tasks assessed  OBSERVATIONS: Pt appears fairly well-kept. Glasses donned. Moderate swelling and scar tissues appreciable to R palm at area of incision.    TODAY'S TREATMENT:        - Manual therapy completed for duration as noted below including: Performed dynamic cupping at site of incision and surrounding area to decrease scar tissue adhesions by mobilizing the soft- tissues such as skin, fascia, neural tissues, muscles, ligaments and tendons and to promote local circulation necessary for optimal functional movement by lifting and separating the tissue underneath the cup. Improved appearance to incision noted upon completion with less palpable adhesions.                                                                                                                        - Therapeutic exercises completed for duration as noted below including:  Reviewed HEP for completion prior to Fluidotherapy use to complete during use.  Pt placed BUE in Fluidotherapy machine with supervised ROM x 10 min. Pt was educated to complete R PROM during modality time to improve ROM and decrease pain/stiffness of affected extremity by use of the machine's massaging action and thermal  properties.   -  Ultrasound completed for duration as noted below including:  Ultrasound applied to palmar right hand and wrist for 8 minutes, frequency of 3 MHz, 20% duty cycle, and 1.1 W/cm with pt's arm placed on soft towel for promotion of ROM, edema reduction, and pain reduction in affected extremity. PATIENT EDUCATION: Education details: reviewed contrast baths, sleep positioning, scar tissue massage; ROM HEP; [place] Person educated: Patient Education method: Explanation and Handouts Education comprehension: verbalized understanding and needs further education  HOME EXERCISE PROGRAM: 03/09/2023: contrast baths, sleep positioning, scar tissue massage  03/13/2023: Access Code: [email or web address] URL: [email or web address]  GOALS:  SHORT TERM GOALS: Target date: 04/07/2023    Patient will demonstrate independence with  HEP with 25% verbal cues or less for proper execution. Baseline: Goal status: INITIAL  2.  Pt will be independent with contrast bath and scar tissue massage as needed for pain and edema management.  Baseline:  Goal status: INITIAL   LONG TERM GOALS: Target date: 04/24/2023  Patient will demonstrate updated RUE HEP with 25% verbal cues or less for proper execution. Baseline:  Goal status: INITIAL  2.  Patient will demonstrate at least 16% improvement with quick Dash score (reporting 29.5 % disability or less) indicating improved functional use of affected extremity. Baseline: 45.5% disability with use of RUE  Goal status: INITIAL  3.  Patient will demonstrate at least 55 lbs R grip strength as needed to open jars and other containers without pain at testing. Baseline: 42.9 lbs with reported pain during testing Goal status: INITIAL   ASSESSMENT:  CLINICAL IMPRESSION: Due to financial situation, pt has had to return to work sooner than advised, which is likely contributing to his pain. Despite this, pt is responding well to pain modalities while in therapy.    PERFORMANCE DEFICITS: in functional skills including ADLs, IADLs, sensation, edema, tone, ROM, strength, pain, fascial restrictions, and UE functional use.   IMPAIRMENTS: are limiting patient from ADLs, IADLs, and work.   COMORBIDITIES: may have co-morbidities  that affects occupational performance. Patient will benefit from skilled OT to address above impairments and improve overall function.  REHAB POTENTIAL: Fair given comorbidities  PLAN:  OT FREQUENCY: 2x/week  OT DURATION: 6 weeks  PLANNED INTERVENTIONS: self care/ADL training, therapeutic exercise, therapeutic activity, neuromuscular re-education, manual therapy, scar mobilization, passive range of motion, splinting, ultrasound, paraffin, fluidotherapy, moist heat, contrast bath, patient/family education, DME and/or AE instructions, and Re-evaluation  RECOMMENDED OTHER SERVICES: none at this time  CONSULTED AND AGREED WITH PLAN OF CARE: Patient  PLAN FOR NEXT SESSION: initiate putty; [place]   Delana Meyer, OT 03/25/2023, 5:07 PM

## 2023-03-30 DIAGNOSIS — F1120 Opioid dependence, uncomplicated: Secondary | ICD-10-CM | POA: Diagnosis not present

## 2023-03-31 ENCOUNTER — Ambulatory Visit: Payer: Medicare HMO | Admitting: Occupational Therapy

## 2023-03-31 NOTE — Therapy (Deleted)
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    Anxiety    Arthritis    DDD lumbar, arthritis knees   Complication of anesthesia    pt states he woke up during a vascular procedure   COPD (chronic obstructive pulmonary disease) (HCC)    DJD (degenerative joint disease) of lumbar spine    GERD (gastroesophageal reflux disease)    Hemorrhoid    internal   History of bowel resection 04/19/2017   Hyperlipidemia    Hypertension    Pt states he's never been told or treated for HTN   Illicit drug use    + UDS cocaine, THC, opiates 10/11/21   Inguinal hernia    MSSA bacteremia 10/11/2021   s/p IV cefazolin, po Linezolid; 10/16/21 TEE w/o vegetations.   Peripheral vascular disease (HCC)    Pneumonia    as child, cough at present with no fever   Pneumonia    as child, cough at present with no fever    Radiation 11/19/11-01/08/12   5040 cGy 28 fx Pelvis and inguinal area   Stroke [facility])    no weaknes or paralysis   Substance abuse (HCC)    last use was Jan. 2024; patient states he has "been clean" since then   Past Surgical History:  Procedure Laterality Date   APPENDECTOMY     age 21 or 56   CARPAL TUNNEL RELEASE Right 02/04/2023   Procedure: RIGHT CARPAL TUNNEL RELEASE;  Surgeon: Tarry Kos, MD;  Location: [facility];  Service: Orthopedics;  Laterality: Right;   EXAMINATION UNDER ANESTHESIA  10/14/2011   Procedure: EXAM UNDER ANESTHESIA;  Surgeon: Velora Heckler, MD;  Location: [facility];  Service: General;  Laterality: N/A;  exam under anethesia, Excision of mass anal canal, 1.5cm   INGUINAL HERNIA REPAIR  05/11/2012   Procedure: HERNIA REPAIR  INGUINAL ADULT;  Surgeon: Velora Heckler, MD;  Location: [facility];  Service: General;  Laterality: Left;  left inguinal hernia repair with mesh   LOWER EXTREMITY ANGIOGRAPHY Left 07/02/2021   Procedure: LOWER EXTREMITY ANGIOGRAPHY;  Surgeon: Renford Dills, MD;  Location: [facility];  Service: Cardiovascular;  Laterality: Left;   LOWER EXTREMITY ANGIOGRAPHY Right 07/16/2021   Procedure: LOWER EXTREMITY ANGIOGRAPHY;  Surgeon: Renford Dills, MD;  Location: [facility];  Service: Cardiovascular;  Laterality: Right;   LOWER EXTREMITY ANGIOGRAPHY Left 08/27/2021   Procedure: LOWER EXTREMITY ANGIOGRAPHY;  Surgeon: Renford Dills, MD;  Location: [facility];  Service: Cardiovascular;  Laterality: Left;   LOWER EXTREMITY ANGIOGRAPHY Left 01/14/2022   Procedure: Lower Extremity Angiography;  Surgeon: Renford Dills, MD;  Location: [facility];  Service: Cardiovascular;  Laterality: Left;   LOWER EXTREMITY ANGIOGRAPHY Left 11/04/2022   Procedure: Lower Extremity Angiography;  Surgeon: Renford Dills, MD;  Location: [facility];  Service: Cardiovascular;  Laterality: Left;   SMALL INTESTINE SURGERY  04/19/2017   exploratory lap, partial small bowel resection for perforated ulcer/abcess, radiation enteritis   surgical pathology   10/14/2011   squamous cell ca of anus   TEE WITHOUT CARDIOVERSION N/A  10/16/2021   Procedure: TRANSESOPHAGEAL ECHOCARDIOGRAM (TEE);  Surgeon: Chrystie Nose, MD;  Location: [facility];  Service: Cardiovascular;  Laterality: N/A;   TOOTH EXTRACTION N/A 12/04/2021   Procedure: DENTAL RESTORATION/EXTRACTIONS;  Surgeon: Ocie Doyne, DMD;  Location: [facility];  Service: Oral Surgery;  Laterality: N/A;   transanal excision  10/14/2011   Dr.Todd Gerkin   Patient Active Problem List   Diagnosis Date Noted   Carpal tunnel syndrome on right 02/03/2023   Right carpal tunnel syndrome 10/28/2022    Protein-calorie malnutrition, severe 04/29/2022   Cellulitis of right lower extremity 04/28/2022   Sepsis 10/12/2021   Peripheral vascular disease    AKI    Tobacco use    COPD (chronic obstructive pulmonary disease) (HCC)    BPH     Periodontal disease    IVDU, cocaine use    Demand ischemia    Thrombocytopenia    Normocytic anemia    MSSA bacteremia 10/11/2021   Callus 06/12/2021   Hav (hallux abducto valgus), unspecified laterality 06/12/2021   Hammer toes, bilateral 06/12/2021   Atherosclerosis of native arteries of extremity with intermittent claudication (HCC) 06/12/2021   Hyperlipidemia 06/07/2021   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   Elevated blood-pressure reading, without diagnosis of hypertension 09/26/2020   Body mass index (BMI) 29.0-29.9, adult 08/29/2020   Partial small bowel obstruction (HCC) 03/10/2020   Small bowel obstruction due to adhesions (HCC) 03/08/2020   Enteritis 10/15/2019   Abnormal liver function 10/15/2019   Leukocytosis 10/15/2019   Nausea & vomiting 10/15/2019   GAD (generalized anxiety disorder) 05/18/2017   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   Constipation 04/11/2017   Bee sting 04/05/2017   Diverticulosis 11/29/2014   Healthcare maintenance 11/06/2014   Cough 10/05/2014   Insomnia 09/13/2014   Erectile dysfunction 11/30/2013   Chronic pain syndrome 11/30/2013   Depression 11/30/2013   GERD (gastroesophageal reflux disease)    Hypertension    Anxiety    Arthritis    Osteoarthritis of lumbar spine    Radiation    Night sweats 08/12/2012   Tobacco abuse counseling 05/26/2012   Lymphocytic colitis 05/03/2012   Vitamin B12 deficiency 03/19/2012   Folate deficiency 03/19/2012   Inguinal hernia 11/24/2011   Cancer (HCC) 10/14/2011   Internal hemorrhoid 09/11/2011   Anal cancer 10/09/2010    ONSET DATE: surgery date 02-04-23  REFERRING DIAG: M79.601 (ICD-10-CM) - Right arm pain  THERAPY DIAG:  No diagnosis  found.  Rationale for Evaluation and Treatment: Rehabilitation  SUBJECTIVE:   SUBJECTIVE STATEMENT: Pt states he went to orthopedist and they confirmed no fracture. They states he only needs to come back as needed.   Pt accompanied by: self  PERTINENT HISTORY: "POST OP/RIGHT CTR (surgery date 02-04-23) Recommend that he wear the removable splint for another couple weeks at night.  Wean as tolerated.  Recheck in 4 weeks.   Follow-Up Instructions: Return in about 4 weeks (around 03/13/2023)."   PRECAUTIONS: None  WEIGHT BEARING RESTRICTIONS: No  PAIN:  Are you having pain? Yes: NPRS scale: 10/10 Pain location: R wrist Pain description: burning Aggravating factors: moving it Relieving factors: rest; medication  Pain improving to 4/10 with treatment  FALLS: Has patient fallen in last 6 months? No  LIVING ENVIRONMENT: Lives with: lives alone Lives in: House/apartment Stairs: Yes: External: 2 steps; on right going up Has following equipment at home: None  PLOF: Independent; driving; on disability  PATIENT GOALS: improve functional use of RUE  NEXT MD VISIT: not yet scheduled  OBJECTIVE:   HAND DOMINANCE: Right  ADLs: Overall ADLs: mod I  FUNCTIONAL OUTCOME MEASURES: Quick Dash: 45.5% disability with use of RUE  UPPER EXTREMITY ROM:     AROM Right (eval) Left (eval)  Shoulder flexion  WNL  Shoulder abduction  WNL  Elbow flexion  WNL  Elbow extension  WNL  Wrist flexion 52* WNL  Wrist extension 65* WNL  Wrist pronation  WNL  Wrist supination WFL WNL   Digit Composite Flexion  WNL  Digit Composite Extension  WNL  Digit Opposition Full but tightness and painful WNL  (Blank rows = not tested)  UPPER EXTREMITY MMT:     BUE WNL for shoulders and elbows  HAND FUNCTION: Grip strength: Right: 42.9 with pain lbs; Left: 85.9 lbs  COORDINATION: 9 Hole Peg test: Right: 27.8 sec; Left: 27 sec  SENSATION: Paresthesias reported in digits 3 and 4   EDEMA:  moderate swelling reported and observed.   COGNITION: Overall cognitive status: Within functional limits for tasks assessed  OBSERVATIONS: Pt appears fairly well-kept. Glasses donned. Moderate swelling and scar tissues appreciable to R palm at area of incision.    TODAY'S TREATMENT:        - Manual therapy completed for duration as noted below including: Performed dynamic cupping at site of incision and surrounding area to decrease scar tissue adhesions by mobilizing the soft- tissues such as skin, fascia, neural tissues, muscles, ligaments and tendons and to promote local circulation necessary for optimal functional movement by lifting and separating the tissue underneath the cup. Improved appearance to incision noted upon completion with less palpable adhesions.                                                                                                                        - Therapeutic exercises completed for duration as noted below including:  Reviewed HEP for completion prior to Fluidotherapy use to complete during use.  Pt placed BUE in Fluidotherapy machine with supervised ROM x 10 min. Pt was educated to complete R PROM during modality time to improve ROM and decrease pain/stiffness of affected extremity by use of the machine's massaging action and thermal properties.   - Ultrasound completed for duration as noted below including:  Ultrasound applied to palmar right hand and wrist for 8 minutes, frequency of 3 MHz, 20% duty cycle, and 1.1 W/cm with pt's arm placed on soft towel for promotion of ROM, edema reduction, and pain reduction in affected extremity. PATIENT EDUCATION: Education details: reviewed contrast baths, sleep positioning, scar tissue massage; ROM HEP; [place] Person educated: Patient Education method: Explanation and Handouts Education comprehension: verbalized understanding and needs further education  HOME EXERCISE PROGRAM: 03/09/2023: contrast baths, sleep  positioning, scar tissue massage  03/13/2023: Access Code: [email or web address] URL: [email or web address]  GOALS:  SHORT TERM GOALS: Target date: 04/07/2023    Patient will demonstrate independence with  HEP with 25% verbal cues or less for proper execution. Baseline: Goal status: INITIAL  2.  Pt  will be independent with contrast bath and scar tissue massage as needed for pain and edema management.  Baseline:  Goal status: INITIAL   LONG TERM GOALS: Target date: 04/24/2023  Patient will demonstrate updated RUE HEP with 25% verbal cues or less for proper execution. Baseline:  Goal status: INITIAL  2.  Patient will demonstrate at least 16% improvement with quick Dash score (reporting 29.5 % disability or less) indicating improved functional use of affected extremity. Baseline: 45.5% disability with use of RUE  Goal status: INITIAL  3.  Patient will demonstrate at least 55 lbs R grip strength as needed to open jars and other containers without pain at testing. Baseline: 42.9 lbs with reported pain during testing Goal status: INITIAL   ASSESSMENT:  CLINICAL IMPRESSION: Due to financial situation, pt has had to return to work sooner than advised, which is likely contributing to his pain. Despite this, pt is responding well to pain modalities while in therapy.   PERFORMANCE DEFICITS: in functional skills including ADLs, IADLs, sensation, edema, tone, ROM, strength, pain, fascial restrictions, and UE functional use.   IMPAIRMENTS: are limiting patient from ADLs, IADLs, and work.   COMORBIDITIES: may have co-morbidities  that affects occupational performance. Patient will benefit from skilled OT to address above impairments and improve overall function.  REHAB POTENTIAL: Fair given comorbidities  PLAN:  OT FREQUENCY: 2x/week  OT DURATION: 6 weeks  PLANNED INTERVENTIONS: self care/ADL training, therapeutic exercise, therapeutic activity, neuromuscular re-education, manual  therapy, scar mobilization, passive range of motion, splinting, ultrasound, paraffin, fluidotherapy, moist heat, contrast bath, patient/family education, DME and/or AE instructions, and Re-evaluation  RECOMMENDED OTHER SERVICES: none at this time  CONSULTED AND AGREED WITH PLAN OF CARE: Patient  PLAN FOR NEXT SESSION: initiate putty; [place]   Delana Meyer, OT 03/31/2023, 2:03 PM

## 2023-04-03 ENCOUNTER — Ambulatory Visit: Payer: Medicare HMO | Admitting: Occupational Therapy

## 2023-04-03 DIAGNOSIS — M25551 Pain in right hip: Secondary | ICD-10-CM | POA: Diagnosis not present

## 2023-04-03 DIAGNOSIS — R600 Localized edema: Secondary | ICD-10-CM

## 2023-04-03 DIAGNOSIS — R208 Other disturbances of skin sensation: Secondary | ICD-10-CM

## 2023-04-03 DIAGNOSIS — M6281 Muscle weakness (generalized): Secondary | ICD-10-CM

## 2023-04-03 DIAGNOSIS — M79641 Pain in right hand: Secondary | ICD-10-CM

## 2023-04-03 DIAGNOSIS — M79601 Pain in right arm: Secondary | ICD-10-CM | POA: Diagnosis not present

## 2023-04-03 NOTE — Therapy (Signed)
OUTPATIENT OCCUPATIONAL THERAPY ORTHO TREATMENT  Patient Name: Martin [name] MRN: [id number] DOB:07/20/1961, 62 y.o., male Today's Date: 04/03/2023  PCP: Does not have one REFERRING PROVIDER: Tarry Kos, MD  END OF SESSION:  OT End of Session - 04/03/23 1402     Visit Number 5    Number of Visits 13    Date for OT Re-Evaluation 04/24/23    Authorization Type [employer] primary; Medicaid secondary - auth approved    Authorization Time Period 03/09/2023 - 04/14/2023    Authorization - Number of Visits 12    Progress Note Due on Visit 10    OT Start Time 1317    OT Stop Time 1355    OT Time Calculation (min) 38 min    Activity Tolerance Patient tolerated treatment well    Behavior During Therapy [facility] for tasks assessed/performed            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    Anxiety    Arthritis    DDD lumbar, arthritis knees   Complication of anesthesia    pt states he woke up during a vascular procedure   COPD (chronic obstructive pulmonary disease) (HCC)    DJD (degenerative joint disease) of lumbar spine    GERD (gastroesophageal reflux disease)    Hemorrhoid    internal   History of bowel resection 04/19/2017   Hyperlipidemia    Hypertension    Pt states he's never been told or treated for HTN   Illicit drug use    + UDS cocaine, THC, opiates 10/11/21   Inguinal hernia    MSSA bacteremia 10/11/2021   s/p IV cefazolin, po Linezolid; 10/16/21 TEE w/o vegetations.   Peripheral vascular disease (HCC)    Pneumonia    as child, cough at present with no fever   Pneumonia    as child, cough at present with no fever    Radiation 11/19/11-01/08/12   5040 cGy 28 fx Pelvis and inguinal area   Stroke [facility])    no weaknes or paralysis   Substance abuse (HCC)    last use was Jan. 2024; patient states he has "been clean" since then   Past Surgical History:   Procedure Laterality Date   APPENDECTOMY     age 63 or 61   CARPAL TUNNEL RELEASE Right 02/04/2023   Procedure: RIGHT CARPAL TUNNEL RELEASE;  Surgeon: Tarry Kos, MD;  Location: [facility];  Service: Orthopedics;  Laterality: Right;   EXAMINATION UNDER ANESTHESIA  10/14/2011   Procedure: EXAM UNDER ANESTHESIA;  Surgeon: Velora Heckler, MD;  Location: [facility];  Service: General;  Laterality: N/A;  exam under anethesia, Excision of mass anal canal, 1.5cm   INGUINAL HERNIA REPAIR  05/11/2012   Procedure: HERNIA REPAIR INGUINAL ADULT;  Surgeon: Velora Heckler, MD;  Location: [facility];  Service: General;  Laterality: Left;  left inguinal hernia repair with mesh   LOWER EXTREMITY ANGIOGRAPHY Left 07/02/2021   Procedure: LOWER EXTREMITY ANGIOGRAPHY;  Surgeon: Renford Dills, MD;  Location: [facility] INVASIVE CV [facility];  Service: Cardiovascular;  Laterality: Left;   LOWER EXTREMITY ANGIOGRAPHY Right 07/16/2021   Procedure: LOWER EXTREMITY ANGIOGRAPHY;  Surgeon: Renford Dills, MD;  Location: [facility];  Service: Cardiovascular;  Laterality: Right;   LOWER EXTREMITY ANGIOGRAPHY Left 08/27/2021   Procedure: LOWER EXTREMITY ANGIOGRAPHY;  Surgeon: Renford Dills, MD;  Location: [facility] CV [facility];  Service: Cardiovascular;  Laterality: Left;   LOWER EXTREMITY ANGIOGRAPHY Left 01/14/2022   Procedure: Lower Extremity Angiography;  Surgeon: Renford Dills, MD;  Location: [facility] IN[facility];  Service: Cardiovascular;  Laterality: Left;   LOWER EXTREMITY ANGIOGRAPHY Left 11/04/2022   Procedure: Lower Extremity Angiography;  Surgeon: Renford Dills, MD;  Location: [facility];  Service: Cardiovascular;  Laterality: Left;   SMALL INTESTINE SURGERY  04/19/2017   exploratory lap, partial small bowel resection for perforated ulcer/abcess, radiation enteritis   surgical pathology   10/14/2011   squamous cell ca of anus   TEE WITHOUT CARDIOVERSION N/A  10/16/2021   Procedure: TRANSESOPHAGEAL ECHOCARDIOGRAM (TEE);  Surgeon: Chrystie Nose, MD;  Location: [facility];  Service: Cardiovascular;  Laterality: N/A;   TOOTH EXTRACTION N/A 12/04/2021   Procedure: DENTAL RESTORATION/EXTRACTIONS;  Surgeon: Ocie Doyne, DMD;  Location: [facility];  Service: Oral Surgery;  Laterality: N/A;   transanal excision  10/14/2011   Dr.Todd Gerkin   Patient Active Problem List   Diagnosis Date Noted   Carpal tunnel syndrome on right 02/03/2023   Right carpal tunnel syndrome 10/28/2022   Protein-calorie malnutrition, severe 04/29/2022   Cellulitis of right lower extremity 04/28/2022   Sepsis 10/12/2021   Peripheral vascular disease    AKI    Tobacco use    COPD (chronic obstructive pulmonary disease) (HCC)    BPH     Periodontal disease    IVDU, cocaine use    Demand ischemia    Thrombocytopenia    Normocytic anemia    MSSA bacteremia 10/11/2021   Callus 06/12/2021   Hav (hallux abducto valgus), unspecified laterality 06/12/2021   Hammer toes, bilateral 06/12/2021   Atherosclerosis of native arteries of extremity with intermittent claudication (HCC) 06/12/2021   Hyperlipidemia 06/07/2021   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   Elevated blood-pressure reading, without diagnosis of hypertension 09/26/2020   Body mass index (BMI) 29.0-29.9, adult 08/29/2020   Partial small bowel obstruction (HCC) 03/10/2020   Small bowel obstruction due to adhesions (HCC) 03/08/2020   Enteritis 10/15/2019   Abnormal liver function 10/15/2019   Leukocytosis 10/15/2019   Nausea & vomiting 10/15/2019   GAD (generalized anxiety disorder) 05/18/2017   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   Constipation 04/11/2017   Bee sting 04/05/2017   Diverticulosis 11/29/2014   Healthcare maintenance 11/06/2014   Cough 10/05/2014   Insomnia 09/13/2014   Erectile dysfunction 11/30/2013   Chronic pain syndrome 11/30/2013   Depression 11/30/2013   GERD  (gastroesophageal reflux disease)    Hypertension    Anxiety    Arthritis    Osteoarthritis of lumbar spine    Radiation    Night sweats 08/12/2012   Tobacco abuse counseling 05/26/2012   Lymphocytic colitis 05/03/2012   Vitamin B12 deficiency 03/19/2012   Folate deficiency 03/19/2012   Inguinal hernia 11/24/2011   Cancer (HCC) 10/14/2011   Internal hemorrhoid 09/11/2011   Anal cancer 10/09/2010    ONSET DATE: surgery date 02-04-23  REFERRING DIAG: M79.601 (ICD-10-CM) - Right arm pain  THERAPY DIAG:  Pain in right hand  Muscle weakness (generalized)  Other disturbances of skin sensation  Localized edema  Rationale for Evaluation and Treatment: Rehabilitation  SUBJECTIVE:   SUBJECTIVE STATEMENT: Pt states he feels the palm of his hand feels less puffy, but now his wrist seems to be puffy. No change in pain.   Pt accompanied by: self  PERTINENT HISTORY: "POST  OP/RIGHT CTR (surgery date 02-04-23) Recommend that he wear the removable splint for another couple weeks at night.  Wean as tolerated.  Recheck in 4 weeks.   Follow-Up Instructions: Return in about 4 weeks (around 03/13/2023)."   PRECAUTIONS: None  WEIGHT BEARING RESTRICTIONS: No  PAIN:  Are you having pain? Yes: NPRS scale: 10/10 Pain location: R wrist Pain description: burning Aggravating factors: moving it Relieving factors: rest; medication  Faces scale indicating 6/10 pain  FALLS: Has patient fallen in last 6 months? No  LIVING ENVIRONMENT: Lives with: lives alone Lives in: House/apartment Stairs: Yes: External: 2 steps; on right going up Has following equipment at home: None  PLOF: Independent; driving; on disability  PATIENT GOALS: improve functional use of RUE  NEXT MD VISIT: not yet scheduled  OBJECTIVE:   HAND DOMINANCE: Right  ADLs: Overall ADLs: mod I  FUNCTIONAL OUTCOME MEASURES: Quick Dash: 45.5% disability with use of RUE  UPPER EXTREMITY ROM:     AROM Right (eval)  Left (eval)  Shoulder flexion  WNL  Shoulder abduction  WNL  Elbow flexion  WNL  Elbow extension  WNL  Wrist flexion 52* WNL  Wrist extension 65* WNL  Wrist pronation  WNL  Wrist supination WFL WNL   Digit Composite Flexion  WNL  Digit Composite Extension  WNL  Digit Opposition Full but tightness and painful WNL  (Blank rows = not tested)  UPPER EXTREMITY MMT:     BUE WNL for shoulders and elbows  HAND FUNCTION: Grip strength: Right: 42.9 with pain lbs; Left: 85.9 lbs  COORDINATION: 9 Hole Peg test: Right: 27.8 sec; Left: 27 sec  SENSATION: Paresthesias reported in digits 3 and 4   EDEMA: moderate swelling reported and observed.   COGNITION: Overall cognitive status: Within functional limits for tasks assessed  OBSERVATIONS: Pt appears fairly well-kept. Glasses donned. Moderate swelling and scar tissues appreciable to R palm at area of incision.    TODAY'S TREATMENT:        - Manual therapy completed for duration as noted below including: Therapist completed IASTM using edge mobility tool at site of incision using free up lotion as emollient for reduction of scar tissue and to promote improved AROM and pain reduction of affected extremity. Improved appearance to incision noted upon completion with less palpable adhesions.                                                                                                                        - Therapeutic exercises completed for duration as noted below including:  Reviewed HEP with pt requiring min cues for proper execution. Pt demonstrating full digit opposition to 5th digit with completion of place and holds as well as pinky slides up and down with thumb for improved ROM.    - Ultrasound completed for duration as noted below including:  Ultrasound applied to palmar right hand and wrist for 8 minutes, frequency of 3 MHz, 20% duty cycle, and 1.1 W/cm with pt's  arm placed on soft towel for promotion of ROM, edema  reduction, and pain reduction in affected extremity. PATIENT EDUCATION: Education details: reviewed contrast baths, sleep positioning, scar tissue massage; ROM HEP; [place] Person educated: Patient Education method: Explanation and Handouts Education comprehension: verbalized understanding and needs further education  HOME EXERCISE PROGRAM: 03/09/2023: contrast baths, sleep positioning, scar tissue massage  03/13/2023: Access Code: [email or web address] URL: [email or web address]  GOALS:  SHORT TERM GOALS: Target date: 04/07/2023    Patient will demonstrate independence with  HEP with 25% verbal cues or less for proper execution. Baseline: Goal status: MET  2.  Pt will be independent with contrast bath and scar tissue massage as needed for pain and edema management.  Baseline:  Goal status: MET   LONG TERM GOALS: Target date: 04/24/2023  Patient will demonstrate updated RUE HEP with 25% verbal cues or less for proper execution. Baseline:  Goal status: INITIAL  2.  Patient will demonstrate at least 16% improvement with quick Dash score (reporting 29.5 % disability or less) indicating improved functional use of affected extremity. Baseline: 45.5% disability with use of RUE  Goal status: INITIAL  3.  Patient will demonstrate at least 55 lbs R grip strength as needed to open jars and other containers without pain at testing. Baseline: 42.9 lbs with reported pain during testing Goal status: INITIAL   ASSESSMENT:  CLINICAL IMPRESSION: Pt responding well to therapy and progressing towards therapy goals. Further therapy to focus on building strength and continuing to manage pain in R hand.   PERFORMANCE DEFICITS: in functional skills including ADLs, IADLs, sensation, edema, tone, ROM, strength, pain, fascial restrictions, and UE functional use.   IMPAIRMENTS: are limiting patient from ADLs, IADLs, and work.   COMORBIDITIES: may have co-morbidities  that affects occupational  performance. Patient will benefit from skilled OT to address above impairments and improve overall function.  REHAB POTENTIAL: Fair given comorbidities  PLAN:  OT FREQUENCY: 2x/week  OT DURATION: 6 weeks  PLANNED INTERVENTIONS: self care/ADL training, therapeutic exercise, therapeutic activity, neuromuscular re-education, manual therapy, scar mobilization, passive range of motion, splinting, ultrasound, paraffin, fluidotherapy, moist heat, contrast bath, patient/family education, DME and/or AE instructions, and Re-evaluation  RECOMMENDED OTHER SERVICES: none at this time  CONSULTED AND AGREED WITH PLAN OF CARE: Patient  PLAN FOR NEXT SESSION: initiate green putty; [place]   Delana Meyer, OT 04/03/2023, 2:04 PM

## 2023-04-06 DIAGNOSIS — F1120 Opioid dependence, uncomplicated: Secondary | ICD-10-CM | POA: Diagnosis not present

## 2023-04-07 ENCOUNTER — Other Ambulatory Visit (INDEPENDENT_AMBULATORY_CARE_PROVIDER_SITE_OTHER): Payer: Self-pay | Admitting: Nurse Practitioner

## 2023-04-07 ENCOUNTER — Ambulatory Visit: Payer: Medicare HMO | Admitting: Occupational Therapy

## 2023-04-07 DIAGNOSIS — M6281 Muscle weakness (generalized): Secondary | ICD-10-CM

## 2023-04-07 DIAGNOSIS — M79641 Pain in right hand: Secondary | ICD-10-CM | POA: Diagnosis not present

## 2023-04-07 DIAGNOSIS — M79601 Pain in right arm: Secondary | ICD-10-CM | POA: Diagnosis not present

## 2023-04-07 DIAGNOSIS — R600 Localized edema: Secondary | ICD-10-CM

## 2023-04-07 DIAGNOSIS — M25551 Pain in right hip: Secondary | ICD-10-CM | POA: Diagnosis not present

## 2023-04-07 DIAGNOSIS — R208 Other disturbances of skin sensation: Secondary | ICD-10-CM | POA: Diagnosis not present

## 2023-04-07 NOTE — Therapy (Signed)
OUTPATIENT OCCUPATIONAL THERAPY ORTHO TREATMENT  Patient Name: Martin [name] MRN: [id number] DOB:Jan 31, 1961, 62 y.o., male Today's Date: 04/07/2023  PCP: Does not have one REFERRING PROVIDER: Tarry Kos, MD  END OF SESSION:  OT End of Session - 04/07/23 1533     Visit Number 6    Number of Visits 13    Date for OT Re-Evaluation 04/24/23    Authorization Type [employer] primary; Medicaid secondary - auth approved    Authorization Time Period 03/09/2023 - 04/14/2023    Authorization - Number of Visits 12    Progress Note Due on Visit 10    OT Start Time 1534    Activity Tolerance Patient tolerated treatment well    Behavior During Therapy [name] for tasks assessed/performed            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    Anxiety    Arthritis    DDD lumbar, arthritis knees   Complication of anesthesia    pt states he woke up during a vascular procedure   COPD (chronic obstructive pulmonary disease) (HCC)    DJD (degenerative joint disease) of lumbar spine    GERD (gastroesophageal reflux disease)    Hemorrhoid    internal   History of bowel resection 04/19/2017   Hyperlipidemia    Hypertension    Pt states he's never been told or treated for HTN   Illicit drug use    + UDS cocaine, THC, opiates 10/11/21   Inguinal hernia    MSSA bacteremia 10/11/2021   s/p IV cefazolin, po Linezolid; 10/16/21 TEE w/o vegetations.   Peripheral vascular disease (HCC)    Pneumonia    as child, cough at present with no fever   Pneumonia    as child, cough at present with no fever    Radiation 11/19/11-01/08/12   5040 cGy 28 fx Pelvis and inguinal area   Stroke [facility])    no weaknes or paralysis   Substance abuse (HCC)    last use was Jan. 2024; patient states he has "been clean" since then   Past Surgical History:  Procedure Laterality Date   APPENDECTOMY     age 42 or 51    CARPAL TUNNEL RELEASE Right 02/04/2023   Procedure: RIGHT CARPAL TUNNEL RELEASE;  Surgeon: Tarry Kos, MD;  Location: [facility];  Service: Orthopedics;  Laterality: Right;   EXAMINATION UNDER ANESTHESIA  10/14/2011   Procedure: EXAM UNDER ANESTHESIA;  Surgeon: Velora Heckler, MD;  Location: [facility];  Service: General;  Laterality: N/A;  exam under anethesia, Excision of mass anal canal, 1.5cm   INGUINAL HERNIA REPAIR  05/11/2012   Procedure: HERNIA REPAIR INGUINAL ADULT;  Surgeon: Velora Heckler, MD;  Location: [facility];  Service: General;  Laterality: Left;  left inguinal hernia repair with mesh   LOWER EXTREMITY ANGIOGRAPHY Left 07/02/2021   Procedure: LOWER EXTREMITY ANGIOGRAPHY;  Surgeon: Renford Dills, MD;  Location: [facility];  Service: Cardiovascular;  Laterality: Left;   LOWER EXTREMITY ANGIOGRAPHY Right 07/16/2021   Procedure: LOWER EXTREMITY ANGIOGRAPHY;  Surgeon: Renford Dills, MD;  Location: [facility];  Service: Cardiovascular;  Laterality: Right;   LOWER EXTREMITY ANGIOGRAPHY Left 08/27/2021   Procedure: LOWER EXTREMITY ANGIOGRAPHY;  Surgeon: Renford Dills, MD;  Location: [facility];  Service: Cardiovascular;  Laterality: Left;   LOWER EXTREMITY ANGIOGRAPHY Left 01/14/2022   Procedure: Lower Extremity Angiography;  Surgeon: Renford Dills, MD;  Location: [facility];  Service: Cardiovascular;  Laterality: Left;   LOWER EXTREMITY ANGIOGRAPHY Left 11/04/2022   Procedure: Lower Extremity Angiography;  Surgeon: Renford Dills, MD;  Location: [facility];  Service: Cardiovascular;  Laterality: Left;   SMALL INTESTINE SURGERY  04/19/2017   exploratory lap, partial small bowel resection for perforated ulcer/abcess, radiation enteritis   surgical pathology   10/14/2011   squamous cell ca of anus   TEE WITHOUT CARDIOVERSION N/A 10/16/2021   Procedure: TRANSESOPHAGEAL ECHOCARDIOGRAM (TEE);   Surgeon: Chrystie Nose, MD;  Location: [facility];  Service: Cardiovascular;  Laterality: N/A;   TOOTH EXTRACTION N/A 12/04/2021   Procedure: DENTAL RESTORATION/EXTRACTIONS;  Surgeon: Ocie Doyne, DMD;  Location: [facility];  Service: Oral Surgery;  Laterality: N/A;   transanal excision  10/14/2011   Dr.Todd Gerkin   Patient Active Problem List   Diagnosis Date Noted   Carpal tunnel syndrome on right 02/03/2023   Right carpal tunnel syndrome 10/28/2022   Protein-calorie malnutrition, severe 04/29/2022   Cellulitis of right lower extremity 04/28/2022   Sepsis 10/12/2021   Peripheral vascular disease    AKI    Tobacco use    COPD (chronic obstructive pulmonary disease) (HCC)    BPH     Periodontal disease    IVDU, cocaine use    Demand ischemia    Thrombocytopenia    Normocytic anemia    MSSA bacteremia 10/11/2021   Callus 06/12/2021   Hav (hallux abducto valgus), unspecified laterality 06/12/2021   Hammer toes, bilateral 06/12/2021   Atherosclerosis of native arteries of extremity with intermittent claudication (HCC) 06/12/2021   Hyperlipidemia 06/07/2021   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   Elevated blood-pressure reading, without diagnosis of hypertension 09/26/2020   Body mass index (BMI) 29.0-29.9, adult 08/29/2020   Partial small bowel obstruction (HCC) 03/10/2020   Small bowel obstruction due to adhesions (HCC) 03/08/2020   Enteritis 10/15/2019   Abnormal liver function 10/15/2019   Leukocytosis 10/15/2019   Nausea & vomiting 10/15/2019   GAD (generalized anxiety disorder) 05/18/2017   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   Constipation 04/11/2017   Bee sting 04/05/2017   Diverticulosis 11/29/2014   Healthcare maintenance 11/06/2014   Cough 10/05/2014   Insomnia 09/13/2014   Erectile dysfunction 11/30/2013   Chronic pain syndrome 11/30/2013   Depression 11/30/2013   GERD (gastroesophageal reflux disease)    Hypertension    Anxiety    Arthritis     Osteoarthritis of lumbar spine    Radiation    Night sweats 08/12/2012   Tobacco abuse counseling 05/26/2012   Lymphocytic colitis 05/03/2012   Vitamin B12 deficiency 03/19/2012   Folate deficiency 03/19/2012   Inguinal hernia 11/24/2011   Cancer (HCC) 10/14/2011   Internal hemorrhoid 09/11/2011   Anal cancer 10/09/2010    ONSET DATE: surgery date 02-04-23  REFERRING DIAG: M79.601 (ICD-10-CM) - Right arm pain  THERAPY DIAG:  Pain in right hand  Muscle weakness (generalized)  Other disturbances of skin sensation  Localized edema  Rationale for Evaluation and Treatment: Rehabilitation  SUBJECTIVE:   SUBJECTIVE STATEMENT: Pt states he hit his right hand on the claw of a hammer at the dorsal MCPs.   Pt accompanied by: self  PERTINENT HISTORY: "POST OP/RIGHT CTR (surgery date 02-04-23) Recommend that he wear the removable splint for another couple weeks at night.  Wean as tolerated.  Recheck in 4 weeks.   Follow-Up Instructions: Return in about 4 weeks (around 03/13/2023)."   PRECAUTIONS: None  WEIGHT BEARING RESTRICTIONS: No  PAIN:  Are you having pain? Yes: NPRS scale: 10/10 Pain location: R wrist Pain description: burning Aggravating factors: moving it Relieving factors: rest; medication  Faces scale indicating 6/10 pain  FALLS: Has patient fallen in last 6 months? No  LIVING ENVIRONMENT: Lives with: lives alone Lives in: House/apartment Stairs: Yes: External: 2 steps; on right going up Has following equipment at home: None  PLOF: Independent; driving; on disability  PATIENT GOALS: improve functional use of RUE  NEXT MD VISIT: not yet scheduled  OBJECTIVE:   HAND DOMINANCE: Right  ADLs: Overall ADLs: mod I  FUNCTIONAL OUTCOME MEASURES: Quick Dash: 45.5% disability with use of RUE   UPPER EXTREMITY ROM:     AROM Right (eval) Left (eval)  Shoulder flexion  WNL  Shoulder abduction  WNL  Elbow flexion  WNL  Elbow extension  WNL  Wrist  flexion 52* WNL  Wrist extension 65* WNL  Wrist pronation  WNL  Wrist supination WFL WNL   Digit Composite Flexion  WNL  Digit Composite Extension  WNL  Digit Opposition Full but tightness and painful WNL  (Blank rows = not tested)  UPPER EXTREMITY MMT:     BUE WNL for shoulders and elbows  HAND FUNCTION: Grip strength: Right: 42.9 with pain lbs; Left: 85.9 lbs  COORDINATION: 9 Hole Peg test: Right: 27.8 sec; Left: 27 sec  SENSATION: Paresthesias reported in digits 3 and 4   EDEMA: moderate swelling reported and observed.   COGNITION: Overall cognitive status: Within functional limits for tasks assessed  OBSERVATIONS: Pt appears fairly well-kept. Glasses donned. Moderate swelling and scar tissues appreciable to R palm at area of incision.    TODAY'S TREATMENT:                                          - Therapeutic exercises completed for duration as noted below including:  OT initiated green theraputty exercises (search, grip, pinch, roll, and smash) as noted in patient instructions for coordination and strength  Pt placed RUE in Fluidotherapy machine with supervised ROM x 12 min. Pt was educated to complete AROM and tendon glides during modality time to improve ROM and decrease pain/stiffness of affected extremity by use of the machine's massaging action and thermal properties.   - Ultrasound completed for duration as noted below including:  Ultrasound applied to palmar right hand and wrist for 8 minutes, frequency of 3 MHz, 20% duty cycle, and 1.1 W/cm with pt's arm placed on soft towel for promotion of ROM, edema reduction, and pain reduction in affected extremity. PATIENT EDUCATION: Education details: reviewed contrast baths, sleep positioning, scar tissue massage; ROM HEP; [place] Person educated: Patient Education method: Explanation and Handouts Education comprehension: verbalized understanding and needs further education  HOME EXERCISE PROGRAM: 03/09/2023: contrast  baths, sleep positioning, scar tissue massage  03/13/2023: Access Code: [email or web address] URL: [email or web address]  GOALS:  SHORT TERM GOALS: Target date: 04/07/2023    Patient will demonstrate independence with  HEP with 25% verbal cues or less for proper execution. Baseline: Goal status: MET  2.  Pt will be independent with contrast bath and scar tissue massage as needed for pain and edema management.  Baseline:  Goal status: MET   LONG TERM GOALS: Target date: 04/24/2023  Patient will demonstrate updated RUE HEP with 25% verbal cues or less for proper execution. Baseline:  Goal status: INITIAL  2.  Patient will demonstrate at least 16% improvement with quick Dash score (reporting 29.5 % disability or less) indicating improved functional use of affected extremity. Baseline: 45.5% disability with use of RUE  Goal status: INITIAL  3.  Patient will demonstrate at least 55 lbs R grip strength as needed to open jars and other containers without pain at testing. Baseline: 42.9 lbs with reported pain during testing 04/07/2023: 54 lbs with reported pain during testing Goal status: INITIAL   ASSESSMENT:  CLINICAL IMPRESSION: Pt responding well to therapy and progressing towards therapy goals though has potential setbacks due to minor injuries. Further therapy to focus on building strength and continuing to manage pain in R hand.   PERFORMANCE DEFICITS: in functional skills including ADLs, IADLs, sensation, edema, tone, ROM, strength, pain, fascial restrictions, and UE functional use.   IMPAIRMENTS: are limiting patient from ADLs, IADLs, and work.   COMORBIDITIES: may have co-morbidities  that affects occupational performance. Patient will benefit from skilled OT to address above impairments and improve overall function.  REHAB POTENTIAL: Fair given comorbidities  PLAN:  OT FREQUENCY: 2x/week  OT DURATION: 6 weeks  PLANNED INTERVENTIONS: self care/ADL training,  therapeutic exercise, therapeutic activity, neuromuscular re-education, manual therapy, scar mobilization, passive range of motion, splinting, ultrasound, paraffin, fluidotherapy, moist heat, contrast bath, patient/family education, DME and/or AE instructions, and Re-evaluation  RECOMMENDED OTHER SERVICES: none at this time  CONSULTED AND AGREED WITH PLAN OF CARE: Patient  PLAN FOR NEXT SESSION: initiate green putty; [place]   Delana Meyer, OT 04/07/2023, 3:37 PM

## 2023-04-07 NOTE — Patient Instructions (Addendum)
Putty Roll    Roll putty back and forth over incision, being sure to use all fingertips and go to the back of the palm. Repeat __10_ times. Do __2__ sessions per day.  Copyright  VHI. All rights reserved.

## 2023-04-09 ENCOUNTER — Ambulatory Visit: Payer: Medicare HMO | Attending: Family | Admitting: Occupational Therapy

## 2023-04-09 DIAGNOSIS — M6281 Muscle weakness (generalized): Secondary | ICD-10-CM | POA: Insufficient documentation

## 2023-04-09 DIAGNOSIS — R600 Localized edema: Secondary | ICD-10-CM | POA: Insufficient documentation

## 2023-04-09 DIAGNOSIS — M79641 Pain in right hand: Secondary | ICD-10-CM | POA: Insufficient documentation

## 2023-04-09 DIAGNOSIS — R208 Other disturbances of skin sensation: Secondary | ICD-10-CM | POA: Insufficient documentation

## 2023-04-09 DIAGNOSIS — R278 Other lack of coordination: Secondary | ICD-10-CM | POA: Insufficient documentation

## 2023-04-14 ENCOUNTER — Telehealth: Payer: Self-pay | Admitting: Occupational Therapy

## 2023-04-14 ENCOUNTER — Ambulatory Visit: Payer: Medicare HMO | Admitting: Occupational Therapy

## 2023-04-14 NOTE — Therapy (Deleted)
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    Anxiety    Arthritis    DDD lumbar, arthritis knees   Complication of anesthesia    pt states he woke up during a vascular procedure   COPD (chronic obstructive pulmonary disease) (HCC)    DJD (degenerative joint disease) of lumbar spine    GERD (gastroesophageal reflux disease)    Hemorrhoid    internal   History of bowel resection 04/19/2017   Hyperlipidemia    Hypertension    Pt states he's never been told or treated for HTN   Illicit drug use    + UDS cocaine, THC, opiates 10/11/21   Inguinal hernia    MSSA bacteremia 10/11/2021   s/p IV cefazolin, po Linezolid; 10/16/21 TEE w/o vegetations.   Peripheral vascular disease (HCC)    Pneumonia    as child, cough at present with no fever   Pneumonia    as child, cough at present with no fever    Radiation 11/19/11-01/08/12   5040 cGy 28 fx Pelvis and inguinal area   Stroke [facility])    no weaknes or paralysis   Substance abuse (HCC)    last use was Jan. 2024; patient states he has "been clean" since then   Past Surgical History:  Procedure Laterality Date   APPENDECTOMY     age 74 or 64   CARPAL TUNNEL RELEASE Right 02/04/2023   Procedure: RIGHT CARPAL TUNNEL RELEASE;  Surgeon: Tarry Kos, MD;  Location: [facility];  Service: Orthopedics;  Laterality: Right;   EXAMINATION UNDER ANESTHESIA  10/14/2011   Procedure: EXAM UNDER ANESTHESIA;  Surgeon: Velora Heckler, MD;  Location: [facility];  Service: General;  Laterality: N/A;  exam under anethesia, Excision of mass anal canal, 1.5cm   INGUINAL HERNIA REPAIR  05/11/2012   Procedure: HERNIA REPAIR INGUINAL  ADULT;  Surgeon: Velora Heckler, MD;  Location: [facility];  Service: General;  Laterality: Left;  left inguinal hernia repair with mesh   LOWER EXTREMITY ANGIOGRAPHY Left 07/02/2021   Procedure: LOWER EXTREMITY ANGIOGRAPHY;  Surgeon: Renford Dills, MD;  Location: [facility];  Service: Cardiovascular;  Laterality: Left;   LOWER EXTREMITY ANGIOGRAPHY Right 07/16/2021   Procedure: LOWER EXTREMITY ANGIOGRAPHY;  Surgeon: Renford Dills, MD;  Location: [facility];  Service: Cardiovascular;  Laterality: Right;   LOWER EXTREMITY ANGIOGRAPHY Left 08/27/2021   Procedure: LOWER EXTREMITY ANGIOGRAPHY;  Surgeon: Renford Dills, MD;  Location: [facility];  Service: Cardiovascular;  Laterality: Left;   LOWER EXTREMITY ANGIOGRAPHY Left 01/14/2022   Procedure: Lower Extremity Angiography;  Surgeon: Renford Dills, MD;  Location: [facility];  Service: Cardiovascular;  Laterality: Left;   LOWER EXTREMITY ANGIOGRAPHY Left 11/04/2022   Procedure: Lower Extremity Angiography;  Surgeon: Renford Dills, MD;  Location: [facility];  Service: Cardiovascular;  Laterality: Left;   SMALL INTESTINE SURGERY  04/19/2017   exploratory lap, partial small bowel resection for perforated ulcer/abcess, radiation enteritis   surgical pathology   10/14/2011   squamous cell ca of anus   TEE WITHOUT CARDIOVERSION N/A 10/16/2021  Procedure: TRANSESOPHAGEAL ECHOCARDIOGRAM (TEE);  Surgeon: Chrystie Nose, MD;  Location: [facility];  Service: Cardiovascular;  Laterality: N/A;   TOOTH EXTRACTION N/A 12/04/2021   Procedure: DENTAL RESTORATION/EXTRACTIONS;  Surgeon: Ocie Doyne, DMD;  Location: [facility];  Service: Oral Surgery;  Laterality: N/A;   transanal excision  10/14/2011   Dr.Todd Gerkin   Patient Active Problem List   Diagnosis Date Noted   Carpal tunnel syndrome on right 02/03/2023   Right carpal tunnel syndrome 10/28/2022   Protein-calorie  malnutrition, severe 04/29/2022   Cellulitis of right lower extremity 04/28/2022   Sepsis 10/12/2021   Peripheral vascular disease    AKI    Tobacco use    COPD (chronic obstructive pulmonary disease) (HCC)    BPH     Periodontal disease    IVDU, cocaine use    Demand ischemia    Thrombocytopenia    Normocytic anemia    MSSA bacteremia 10/11/2021   Callus 06/12/2021   Hav (hallux abducto valgus), unspecified laterality 06/12/2021   Hammer toes, bilateral 06/12/2021   Atherosclerosis of native arteries of extremity with intermittent claudication (HCC) 06/12/2021   Hyperlipidemia 06/07/2021   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   Elevated blood-pressure reading, without diagnosis of hypertension 09/26/2020   Body mass index (BMI) 29.0-29.9, adult 08/29/2020   Partial small bowel obstruction (HCC) 03/10/2020   Small bowel obstruction due to adhesions (HCC) 03/08/2020   Enteritis 10/15/2019   Abnormal liver function 10/15/2019   Leukocytosis 10/15/2019   Nausea & vomiting 10/15/2019   GAD (generalized anxiety disorder) 05/18/2017   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   Constipation 04/11/2017   Bee sting 04/05/2017   Diverticulosis 11/29/2014   Healthcare maintenance 11/06/2014   Cough 10/05/2014   Insomnia 09/13/2014   Erectile dysfunction 11/30/2013   Chronic pain syndrome 11/30/2013   Depression 11/30/2013   GERD (gastroesophageal reflux disease)    Hypertension    Anxiety    Arthritis    Osteoarthritis of lumbar spine    Radiation    Night sweats 08/12/2012   Tobacco abuse counseling 05/26/2012   Lymphocytic colitis 05/03/2012   Vitamin B12 deficiency 03/19/2012   Folate deficiency 03/19/2012   Inguinal hernia 11/24/2011   Cancer (HCC) 10/14/2011   Internal hemorrhoid 09/11/2011   Anal cancer 10/09/2010    ONSET DATE: surgery date 02-04-23  REFERRING DIAG: M79.601 (ICD-10-CM) - Right arm pain  THERAPY DIAG:  No diagnosis found.  Rationale for  Evaluation and Treatment: Rehabilitation  SUBJECTIVE:   SUBJECTIVE STATEMENT: Pt states he hit his right hand on the claw of a hammer at the dorsal MCPs.   Pt accompanied by: self  PERTINENT HISTORY: "POST OP/RIGHT CTR (surgery date 02-04-23) Recommend that he wear the removable splint for another couple weeks at night.  Wean as tolerated.  Recheck in 4 weeks.   Follow-Up Instructions: Return in about 4 weeks (around 03/13/2023)."   PRECAUTIONS: None  WEIGHT BEARING RESTRICTIONS: No  PAIN:  Are you having pain? Yes: NPRS scale: 10/10 Pain location: R wrist Pain description: burning Aggravating factors: moving it Relieving factors: rest; medication  Faces scale indicating 6/10 pain  FALLS: Has patient fallen in last 6 months? No  LIVING ENVIRONMENT: Lives with: lives alone Lives in: House/apartment Stairs: Yes: External: 2 steps; on right going up Has following equipment at home: None  PLOF: Independent; driving; on disability  PATIENT GOALS: improve functional use of RUE  NEXT MD VISIT: not yet scheduled  OBJECTIVE:   HAND DOMINANCE: Right  ADLs: Overall ADLs: mod I  FUNCTIONAL OUTCOME MEASURES: Quick Dash: 45.5% disability with use of RUE   UPPER EXTREMITY ROM:     AROM Right (eval) Left (eval)  Shoulder flexion  WNL  Shoulder abduction  WNL  Elbow flexion  WNL  Elbow extension  WNL  Wrist flexion 52* WNL  Wrist extension 65* WNL  Wrist pronation  WNL  Wrist supination WFL WNL   Digit Composite Flexion  WNL  Digit Composite Extension  WNL  Digit Opposition Full but tightness and painful WNL  (Blank rows = not tested)  UPPER EXTREMITY MMT:     BUE WNL for shoulders and elbows  HAND FUNCTION: Grip strength: Right: 42.9 with pain lbs; Left: 85.9 lbs  COORDINATION: 9 Hole Peg test: Right: 27.8 sec; Left: 27 sec  SENSATION: Paresthesias reported in digits 3 and 4   EDEMA: moderate swelling reported and observed.   COGNITION: Overall  cognitive status: Within functional limits for tasks assessed  OBSERVATIONS: Pt appears fairly well-kept. Glasses donned. Moderate swelling and scar tissues appreciable to R palm at area of incision.    TODAY'S TREATMENT:                                          - Therapeutic exercises completed for duration as noted below including:  OT initiated green theraputty exercises (search, grip, pinch, roll, and smash) as noted in patient instructions for coordination and strength  Pt placed RUE in Fluidotherapy machine with supervised ROM x 12 min. Pt was educated to complete AROM and tendon glides during modality time to improve ROM and decrease pain/stiffness of affected extremity by use of the machine's massaging action and thermal properties.   - Ultrasound completed for duration as noted below including:  Ultrasound applied to palmar right hand and wrist for 8 minutes, frequency of 3 MHz, 20% duty cycle, and 1.1 W/cm with pt's arm placed on soft towel for promotion of ROM, edema reduction, and pain reduction in affected extremity. PATIENT EDUCATION: Education details: reviewed contrast baths, sleep positioning, scar tissue massage; ROM HEP; [place] Person educated: Patient Education method: Explanation and Handouts Education comprehension: verbalized understanding and needs further education  HOME EXERCISE PROGRAM: 03/09/2023: contrast baths, sleep positioning, scar tissue massage  03/13/2023: Access Code: [email or web address] URL: [email or web address]  GOALS:  SHORT TERM GOALS: Target date: 04/07/2023    Patient will demonstrate independence with  HEP with 25% verbal cues or less for proper execution. Baseline: Goal status: MET  2.  Pt will be independent with contrast bath and scar tissue massage as needed for pain and edema management.  Baseline:  Goal status: MET   LONG TERM GOALS: Target date: 04/24/2023  Patient will demonstrate updated RUE HEP with 25% verbal cues or less for  proper execution. Baseline:  Goal status: INITIAL  2.  Patient will demonstrate at least 16% improvement with quick Dash score (reporting 29.5 % disability or less) indicating improved functional use of affected extremity. Baseline: 45.5% disability with use of RUE  Goal status: INITIAL  3.  Patient will demonstrate at least 55 lbs R grip strength as needed to open jars and other containers without pain at testing. Baseline: 42.9 lbs with reported pain during testing 04/07/2023: 54 lbs with reported pain during testing Goal status: INITIAL   ASSESSMENT:  CLINICAL IMPRESSION: Pt responding well to therapy and progressing towards therapy goals though  has potential setbacks due to minor injuries. Further therapy to focus on building strength and continuing to manage pain in R hand.   PERFORMANCE DEFICITS: in functional skills including ADLs, IADLs, sensation, edema, tone, ROM, strength, pain, fascial restrictions, and UE functional use.   IMPAIRMENTS: are limiting patient from ADLs, IADLs, and work.   COMORBIDITIES: may have co-morbidities  that affects occupational performance. Patient will benefit from skilled OT to address above impairments and improve overall function.  REHAB POTENTIAL: Fair given comorbidities  PLAN:  OT FREQUENCY: 2x/week  OT DURATION: 6 weeks  PLANNED INTERVENTIONS: self care/ADL training, therapeutic exercise, therapeutic activity, neuromuscular re-education, manual therapy, scar mobilization, passive range of motion, splinting, ultrasound, paraffin, fluidotherapy, moist heat, contrast bath, patient/family education, DME and/or AE instructions, and Re-evaluation  RECOMMENDED OTHER SERVICES: none at this time  CONSULTED AND AGREED WITH PLAN OF CARE: Patient  PLAN FOR NEXT SESSION: review green putty; [place]   Delana Meyer, OT 04/14/2023, 9:57 AM

## 2023-04-14 NOTE — Telephone Encounter (Signed)
Pt reports he has been working in [place] and unable to get to his appointment times. He reports continued pain in R hand and wrist and wanting to continue with OT. Pt stated he would make sure to come to appointment time on 8/27 at 3:30 p.m.   If pt does not show to this appointment, he will need to be d/c. If he does show, his POC will need to be recerted.

## 2023-04-27 DIAGNOSIS — F1120 Opioid dependence, uncomplicated: Secondary | ICD-10-CM | POA: Diagnosis not present

## 2023-05-04 DIAGNOSIS — F1120 Opioid dependence, uncomplicated: Secondary | ICD-10-CM | POA: Diagnosis not present

## 2023-05-05 ENCOUNTER — Ambulatory Visit: Payer: Medicare HMO | Admitting: Occupational Therapy

## 2023-05-05 DIAGNOSIS — M6281 Muscle weakness (generalized): Secondary | ICD-10-CM

## 2023-05-05 DIAGNOSIS — M79641 Pain in right hand: Secondary | ICD-10-CM

## 2023-05-05 DIAGNOSIS — R208 Other disturbances of skin sensation: Secondary | ICD-10-CM

## 2023-05-05 DIAGNOSIS — R278 Other lack of coordination: Secondary | ICD-10-CM | POA: Diagnosis not present

## 2023-05-05 DIAGNOSIS — R600 Localized edema: Secondary | ICD-10-CM | POA: Diagnosis not present

## 2023-05-05 NOTE — Therapy (Unsigned)
OUTPATIENT OCCUPATIONAL THERAPY ORTHO TREATMENT & PROGRESS REPORT  Patient Name: Martin [name] MRN: [id number] DOB:Apr 22, 1961, 62 y.o., male Today's Date: 05/05/2023  PCP: Does not have one REFERRING PROVIDER: Tarry Kos, MD  END OF SESSION:  OT End of Session - 05/05/23 1531     Visit Number 7    Number of Visits 13    Date for OT Re-Evaluation 04/24/23    Authorization Type [employer] primary; Medicaid secondary - auth approved    Authorization Time Period 03/09/2023 - 04/14/2023    Authorization - Number of Visits 12    Progress Note Due on Visit 10    OT Start Time 1540    OT Stop Time 1625    OT Time Calculation (min) 45 min    Equipment Utilized During Treatment testing material    Activity Tolerance Patient tolerated treatment well    Behavior During Therapy [facility] for tasks assessed/performed            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    Anxiety    Arthritis    DDD lumbar, arthritis knees   Complication of anesthesia    pt states he woke up during a vascular procedure   COPD (chronic obstructive pulmonary disease) (HCC)    DJD (degenerative joint disease) of lumbar spine    GERD (gastroesophageal reflux disease)    Hemorrhoid    internal   History of bowel resection 04/19/2017   Hyperlipidemia    Hypertension    Pt states he's never been told or treated for HTN   Illicit drug use    + UDS cocaine, THC, opiates 10/11/21   Inguinal hernia    MSSA bacteremia 10/11/2021   s/p IV cefazolin, po Linezolid; 10/16/21 TEE w/o vegetations.   Peripheral vascular disease (HCC)    Pneumonia    as child, cough at present with no fever   Pneumonia    as child, cough at present with no fever    Radiation 11/19/11-01/08/12   5040 cGy 28 fx Pelvis and inguinal area   Stroke [facility])    no weaknes or paralysis   Substance abuse (HCC)    last use was Jan. 2024; patient  states he has "been clean" since then   Past Surgical History:  Procedure Laterality Date   APPENDECTOMY     age 51 or 16   CARPAL TUNNEL RELEASE Right 02/04/2023   Procedure: RIGHT CARPAL TUNNEL RELEASE;  Surgeon: Tarry Kos, MD;  Location: [facility];  Service: Orthopedics;  Laterality: Right;   EXAMINATION UNDER ANESTHESIA  10/14/2011   Procedure: EXAM UNDER ANESTHESIA;  Surgeon: Velora Heckler, MD;  Location: [facility];  Service: General;  Laterality: N/A;  exam under anethesia, Excision of mass anal canal, 1.5cm   INGUINAL HERNIA REPAIR  05/11/2012   Procedure: HERNIA REPAIR INGUINAL ADULT;  Surgeon: Velora Heckler, MD;  Location: [facility];  Service: General;  Laterality: Left;  left inguinal hernia repair with mesh   LOWER EXTREMITY ANGIOGRAPHY Left 07/02/2021   Procedure: LOWER EXTREMITY ANGIOGRAPHY;  Surgeon: Renford Dills, MD;  Location: [facility];  Service: Cardiovascular;  Laterality: Left;   LOWER EXTREMITY ANGIOGRAPHY Right 07/16/2021   Procedure: LOWER EXTREMITY ANGIOGRAPHY;  Surgeon: Renford Dills, MD;  Location: [facility];  Service: Cardiovascular;  Laterality: Right;  LOWER EXTREMITY ANGIOGRAPHY Left 08/27/2021   Procedure: LOWER EXTREMITY ANGIOGRAPHY;  Surgeon: Renford Dills, MD;  Location: [facility];  Service: Cardiovascular;  Laterality: Left;   LOWER EXTREMITY ANGIOGRAPHY Left 01/14/2022   Procedure: Lower Extremity Angiography;  Surgeon: Renford Dills, MD;  Location: [facility];  Service: Cardiovascular;  Laterality: Left;   LOWER EXTREMITY ANGIOGRAPHY Left 11/04/2022   Procedure: Lower Extremity Angiography;  Surgeon: Renford Dills, MD;  Location: [facility];  Service: Cardiovascular;  Laterality: Left;   SMALL INTESTINE SURGERY  04/19/2017   exploratory lap, partial small bowel resection for perforated ulcer/abcess, radiation enteritis   surgical pathology    10/14/2011   squamous cell ca of anus   TEE WITHOUT CARDIOVERSION N/A 10/16/2021   Procedure: TRANSESOPHAGEAL ECHOCARDIOGRAM (TEE);  Surgeon: Chrystie Nose, MD;  Location: [facility];  Service: Cardiovascular;  Laterality: N/A;   TOOTH EXTRACTION N/A 12/04/2021   Procedure: DENTAL RESTORATION/EXTRACTIONS;  Surgeon: Ocie Doyne, DMD;  Location: [facility];  Service: Oral Surgery;  Laterality: N/A;   transanal excision  10/14/2011   Dr.Todd Gerkin   Patient Active Problem List   Diagnosis Date Noted   Carpal tunnel syndrome on right 02/03/2023   Right carpal tunnel syndrome 10/28/2022   Protein-calorie malnutrition, severe 04/29/2022   Cellulitis of right lower extremity 04/28/2022   Sepsis 10/12/2021   Peripheral vascular disease    AKI    Tobacco use    COPD (chronic obstructive pulmonary disease) (HCC)    BPH     Periodontal disease    IVDU, cocaine use    Demand ischemia    Thrombocytopenia    Normocytic anemia    MSSA bacteremia 10/11/2021   Callus 06/12/2021   Hav (hallux abducto valgus), unspecified laterality 06/12/2021   Hammer toes, bilateral 06/12/2021   Atherosclerosis of native arteries of extremity with intermittent claudication (HCC) 06/12/2021   Hyperlipidemia 06/07/2021   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   Elevated blood-pressure reading, without diagnosis of hypertension 09/26/2020   Body mass index (BMI) 29.0-29.9, adult 08/29/2020   Partial small bowel obstruction (HCC) 03/10/2020   Small bowel obstruction due to adhesions (HCC) 03/08/2020   Enteritis 10/15/2019   Abnormal liver function 10/15/2019   Leukocytosis 10/15/2019   Nausea & vomiting 10/15/2019   GAD (generalized anxiety disorder) 05/18/2017   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   Constipation 04/11/2017   Bee sting 04/05/2017   Diverticulosis 11/29/2014   Healthcare maintenance 11/06/2014   Cough 10/05/2014   Insomnia 09/13/2014   Erectile dysfunction 11/30/2013   Chronic  pain syndrome 11/30/2013   Depression 11/30/2013   GERD (gastroesophageal reflux disease)    Hypertension    Anxiety    Arthritis    Osteoarthritis of lumbar spine    Radiation    Night sweats 08/12/2012   Tobacco abuse counseling 05/26/2012   Lymphocytic colitis 05/03/2012   Vitamin B12 deficiency 03/19/2012   Folate deficiency 03/19/2012   Inguinal hernia 11/24/2011   Cancer (HCC) 10/14/2011   Internal hemorrhoid 09/11/2011   Anal cancer 10/09/2010    ONSET DATE: surgery date 02-04-23  REFERRING DIAG: M79.601 (ICD-10-CM) - Right arm pain  THERAPY DIAG:  Pain in right hand  Muscle weakness (generalized)  Localized edema  Other lack of coordination  Other disturbances of skin sensation  Rationale for Evaluation and Treatment: Rehabilitation  SUBJECTIVE:   SUBJECTIVE STATEMENT: Pt reports it seems like things are worse since the surgery It hurts all the time even  at night it wakes him up. Pt accompanied by: self  PERTINENT HISTORY: "POST OP/RIGHT CTR (surgery date 02-04-23) Recommend that he wear the removable splint for another couple weeks at night.  Wean as tolerated.  Recheck in 4 weeks.   Follow-Up Instructions: Return in about 4 weeks (around 03/13/2023)."   PRECAUTIONS: None  WEIGHT BEARING RESTRICTIONS: No  PAIN:  Are you having pain? At rest 0/10 but at night he has aching despite resting When he uses it Yes: NPRS scale: 10/10 Pain location: R wrist and up into webspace Pain description: shooting pain Aggravating factors: trying to make a fist Relieving factors: rest; medication and sometimes dangling it off the bed  Faces scale indicating 6/10 pain  FALLS: Has patient fallen in last 6 months? No  LIVING ENVIRONMENT: Lives with: lives alone Lives in: House/apartment Stairs: Yes: External: 2 steps; on right going up Has following equipment at home: None  PLOF: Independent; driving; on disability  PATIENT GOALS: improve functional use of  RUE  NEXT MD VISIT: not yet scheduled  OBJECTIVE:   HAND DOMINANCE: Right  ADLs: Overall ADLs: mod I  FUNCTIONAL OUTCOME MEASURES: Quick Dash: 45.5% disability with use of RUE   UPPER EXTREMITY ROM:     AROM Right (eval) Left (eval)  Shoulder flexion  WNL  Shoulder abduction  WNL  Elbow flexion  WNL  Elbow extension  WNL  Wrist flexion 52* WNL  Wrist extension 65* WNL  Wrist pronation  WNL  Wrist supination WFL WNL   Digit Composite Flexion  WNL  Digit Composite Extension  WNL  Digit Opposition Full but tightness and painful WNL  (Blank rows = not tested)  UPPER EXTREMITY MMT:     BUE WNL for shoulders and elbows  HAND FUNCTION: Grip strength: Right: 42.9 with pain lbs; Left: 85.9 lbs 05/05/23 Right: 39.4, 48.9, 54.0 Left: 80.6, 84.4, 85.0 Squeezing  COORDINATION: 9 Hole Peg test: Right: 27.8 sec; Left: 27 sec  SENSATION: Tingling reported in digit 3 verus 3 & 4 at eval.   EDEMA: moderate swelling reported and observed.  L wrist 16.4 cm 17.5 cm  COGNITION: Overall cognitive status: Within functional limits for tasks assessed  OBSERVATIONS: Pt appears fairly well-kept. Glasses donned. Moderate swelling and scar tissues appreciable to R palm at area of incision.    TODAY'S TREATMENT:                                          - Therapeutic exercises completed for duration as noted below including:    Retest for Progress note  Tendon Gliding Exercises  Putty - Squeeze & Roll  - Manual Techniques for further scar massage and mobilization  PATIENT EDUCATION: Education details: scar tissue massage; Tendon glide ROM HEP; putty exercises Person educated: Patient Education method: Explanation and Handouts Education comprehension: verbalized understanding and needs further education  HOME EXERCISE PROGRAM: 03/09/2023: contrast baths, sleep positioning, scar tissue massage  03/13/2023: Access Code: [email or web address] URL:  [email or web address]  GOALS:  SHORT TERM GOALS: Target date: 04/07/2023    Patient will demonstrate independence with  HEP with 25% verbal cues or less for proper execution. Baseline: Goal status: MET  2.  Pt will be independent with contrast bath and scar tissue massage as needed for pain and edema management.  Baseline:  Goal status: MET   LONG TERM GOALS: Target date: 04/24/2023  Patient will demonstrate  updated RUE HEP with 25% verbal cues or less for proper execution. Baseline:  Goal status: INITIAL  2.  Patient will demonstrate at least 16% improvement with quick Dash score (reporting 29.5 % disability or less) indicating improved functional use of affected extremity. Baseline: 45.5% disability with use of RUE  Goal status: INITIAL  3.  Patient will demonstrate at least 55 lbs R grip strength as needed to open jars and other containers without pain at testing. Baseline: 42.9 lbs with reported pain during testing 04/07/2023: 54 lbs with reported pain during testing Goal status: INITIAL   ASSESSMENT:  CLINICAL IMPRESSION: Pt responding well to therapy and progressing towards therapy goals though has potential setbacks due to minor injuries. Further therapy to focus on building strength and continuing to manage pain in R hand.   PERFORMANCE DEFICITS: in functional skills including ADLs, IADLs, sensation, edema, tone, ROM, strength, pain, fascial restrictions, and UE functional use.   IMPAIRMENTS: are limiting patient from ADLs, IADLs, and work.   COMORBIDITIES: may have co-morbidities  that affects occupational performance. Patient will benefit from skilled OT to address above impairments and improve overall function.  REHAB POTENTIAL: Fair given comorbidities  PLAN:  OT FREQUENCY: 2x/week  OT DURATION: 6 weeks  PLANNED INTERVENTIONS: self care/ADL training, therapeutic exercise, therapeutic activity, neuromuscular re-education, manual therapy, scar  mobilization, passive range of motion, splinting, ultrasound, paraffin, fluidotherapy, moist heat, contrast bath, patient/family education, DME and/or AE instructions, and Re-evaluation  RECOMMENDED OTHER SERVICES: none at this time  CONSULTED AND AGREED WITH PLAN OF CARE: Patient  PLAN FOR NEXT SESSION: initiate green putty; [place]   Victorino Sparrow, OT 05/05/2023, 6:31 PM

## 2023-05-07 DIAGNOSIS — I70219 Atherosclerosis of native arteries of extremities with intermittent claudication, unspecified extremity: Secondary | ICD-10-CM | POA: Diagnosis not present

## 2023-05-07 DIAGNOSIS — N400 Enlarged prostate without lower urinary tract symptoms: Secondary | ICD-10-CM | POA: Diagnosis not present

## 2023-05-07 DIAGNOSIS — R519 Headache, unspecified: Secondary | ICD-10-CM | POA: Diagnosis not present

## 2023-05-07 DIAGNOSIS — E785 Hyperlipidemia, unspecified: Secondary | ICD-10-CM | POA: Diagnosis not present

## 2023-05-07 DIAGNOSIS — D689 Coagulation defect, unspecified: Secondary | ICD-10-CM | POA: Diagnosis not present

## 2023-05-07 DIAGNOSIS — C20 Malignant neoplasm of rectum: Secondary | ICD-10-CM | POA: Diagnosis not present

## 2023-05-07 DIAGNOSIS — G5601 Carpal tunnel syndrome, right upper limb: Secondary | ICD-10-CM | POA: Diagnosis not present

## 2023-05-07 DIAGNOSIS — D696 Thrombocytopenia, unspecified: Secondary | ICD-10-CM | POA: Diagnosis not present

## 2023-05-07 DIAGNOSIS — E43 Unspecified severe protein-calorie malnutrition: Secondary | ICD-10-CM | POA: Diagnosis not present

## 2023-05-08 ENCOUNTER — Encounter (HOSPITAL_COMMUNITY): Payer: Self-pay | Admitting: *Deleted

## 2023-05-08 ENCOUNTER — Other Ambulatory Visit: Payer: Self-pay

## 2023-05-08 ENCOUNTER — Ambulatory Visit (HOSPITAL_COMMUNITY)
Admission: EM | Admit: 2023-05-08 | Discharge: 2023-05-08 | Disposition: A | Discharge status: Home / Self Care | Payer: Medicare HMO | Attending: Internal Medicine | Admitting: Internal Medicine

## 2023-05-08 DIAGNOSIS — S70361A Insect bite (nonvenomous), right thigh, initial encounter: Secondary | ICD-10-CM | POA: Diagnosis not present

## 2023-05-08 DIAGNOSIS — W57XXXA Bitten or stung by nonvenomous insect and other nonvenomous arthropods, initial encounter: Secondary | ICD-10-CM

## 2023-05-08 MED ORDER — MUPIROCIN 2 % EX OINT: 1.0000 | TOPICAL_OINTMENT | Freq: Two times a day (BID) | CUTANEOUS | 0 refills | Status: DC

## 2023-05-08 NOTE — Discharge Instructions (Signed)
By mupirocin ointment to the wound twice a day for the next 7 days.  Gently cleanse the wound with warm water twice a day.  If you notice any worsening swelling, redness, pus, or pain to the site, please return to urgent care for reevaluation as you may need oral antibiotics at that time.  If you develop any new or worsening symptoms or if your symptoms do not start to improve, please return here or follow-up with your primary care provider. If your symptoms are severe, please go to the emergency room.

## 2023-05-08 NOTE — ED Triage Notes (Signed)
Pt has a red mark on Rt upper thigh that pt reports to be a spider. Pt also reports his breathing is a little weird.

## 2023-05-08 NOTE — ED Provider Notes (Addendum)
[facility]    CSN: [id number] Arrival date & time: 05/08/23  1558      History   Chief Complaint Chief Complaint  Patient presents with   Insect Bite    HPI Martin [name] is a 62 y.o. male.   Patient presents to urgent care for evaluation of potential spider bite to the right medial thigh that happened today while he was driving in a car that had spider webs inside of it.  He suspects the bite happened this afternoon and there is already an area of redness and swelling.  The site of the potential bite is mildly tender to palpation.  No fever or chills, no nausea/vomiting.  He is not experiencing any warmth or significant redness to the surrounding soft tissue.  States he has a weakened immune system and came in to be checked out of caution.  Last tetanus injection was 6 months ago.     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    Anxiety    Arthritis    DDD lumbar, arthritis knees   Complication of anesthesia    pt states he woke up during a vascular procedure   COPD (chronic obstructive pulmonary disease) (HCC)    DJD (degenerative joint disease) of lumbar spine    GERD (gastroesophageal reflux disease)    Hemorrhoid    internal   History of bowel resection 04/19/2017   Hyperlipidemia    Hypertension    Pt states he's never been told or treated for HTN   Illicit drug use    + UDS cocaine, THC, opiates 10/11/21   Inguinal hernia    MSSA bacteremia 10/11/2021   s/p IV cefazolin, po Linezolid; 10/16/21 TEE w/o vegetations.   Peripheral vascular disease (HCC)    Pneumonia    as child, cough at present with no fever   Pneumonia    as child, cough at present with no fever    Radiation 11/19/11-01/08/12   5040 cGy 28 fx Pelvis and inguinal area   Stroke [facility])    no weaknes or paralysis   Substance abuse (HCC)    last use was Jan. 2024; patient states he has "been  clean" since then    Patient Active Problem List   Diagnosis Date Noted   Carpal tunnel syndrome on right 02/03/2023   Right carpal tunnel syndrome 10/28/2022   Protein-calorie malnutrition, severe 04/29/2022   Cellulitis of right lower extremity 04/28/2022   Sepsis 10/12/2021   Peripheral vascular disease    AKI    Tobacco use    COPD (chronic obstructive pulmonary disease) (HCC)    BPH     Periodontal disease    IVDU, cocaine use    Demand ischemia    Thrombocytopenia    Normocytic anemia    MSSA bacteremia 10/11/2021   Callus 06/12/2021   Hav (hallux abducto valgus), unspecified laterality 06/12/2021   Hammer toes, bilateral 06/12/2021   Atherosclerosis of native arteries of extremity with intermittent claudication (HCC) 06/12/2021   Hyperlipidemia 06/07/2021   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   Elevated blood-pressure reading, without diagnosis of hypertension 09/26/2020   Body mass index (BMI) 29.0-29.9, adult 08/29/2020   Partial small bowel obstruction (HCC) 03/10/2020   Small bowel obstruction due to adhesions (HCC) 03/08/2020   Enteritis 10/15/2019   Abnormal liver function 10/15/2019   Leukocytosis 10/15/2019  Nausea & vomiting 10/15/2019   GAD (generalized anxiety disorder) 05/18/2017   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   Constipation 04/11/2017   Bee sting 04/05/2017   Diverticulosis 11/29/2014   Healthcare maintenance 11/06/2014   Cough 10/05/2014   Insomnia 09/13/2014   Erectile dysfunction 11/30/2013   Chronic pain syndrome 11/30/2013   Depression 11/30/2013   GERD (gastroesophageal reflux disease)    Hypertension    Anxiety    Arthritis    Osteoarthritis of lumbar spine    Radiation    Night sweats 08/12/2012   Tobacco abuse counseling 05/26/2012   Lymphocytic colitis 05/03/2012   Vitamin B12 deficiency 03/19/2012   Folate deficiency 03/19/2012   Inguinal hernia 11/24/2011   Cancer (HCC) 10/14/2011   Internal hemorrhoid  09/11/2011   Anal cancer 10/09/2010    Past Surgical History:  Procedure Laterality Date   APPENDECTOMY     age 60 or 77   CARPAL TUNNEL RELEASE Right 02/04/2023   Procedure: RIGHT CARPAL TUNNEL RELEASE;  Surgeon: Tarry Kos, MD;  Location: [facility];  Service: Orthopedics;  Laterality: Right;   EXAMINATION UNDER ANESTHESIA  10/14/2011   Procedure: EXAM UNDER ANESTHESIA;  Surgeon: Velora Heckler, MD;  Location: [facility];  Service: General;  Laterality: N/A;  exam under anethesia, Excision of mass anal canal, 1.5cm   INGUINAL HERNIA REPAIR  05/11/2012   Procedure: HERNIA REPAIR INGUINAL ADULT;  Surgeon: Velora Heckler, MD;  Location: [facility];  Service: General;  Laterality: Left;  left inguinal hernia repair with mesh   LOWER EXTREMITY ANGIOGRAPHY Left 07/02/2021   Procedure: LOWER EXTREMITY ANGIOGRAPHY;  Surgeon: Renford Dills, MD;  Location: [facility];  Service: Cardiovascular;  Laterality: Left;   LOWER EXTREMITY ANGIOGRAPHY Right 07/16/2021   Procedure: LOWER EXTREMITY ANGIOGRAPHY;  Surgeon: Renford Dills, MD;  Location: [facility];  Service: Cardiovascular;  Laterality: Right;   LOWER EXTREMITY ANGIOGRAPHY Left 08/27/2021   Procedure: LOWER EXTREMITY ANGIOGRAPHY;  Surgeon: Renford Dills, MD;  Location: [facility];  Service: Cardiovascular;  Laterality: Left;   LOWER EXTREMITY ANGIOGRAPHY Left 01/14/2022   Procedure: Lower Extremity Angiography;  Surgeon: Renford Dills, MD;  Location: [facility];  Service: Cardiovascular;  Laterality: Left;   LOWER EXTREMITY ANGIOGRAPHY Left 11/04/2022   Procedure: Lower Extremity Angiography;  Surgeon: Renford Dills, MD;  Location: [facility];  Service: Cardiovascular;  Laterality: Left;   SMALL INTESTINE SURGERY  04/19/2017   exploratory lap, partial small bowel resection for perforated ulcer/abcess, radiation enteritis   surgical pathology    10/14/2011   squamous cell ca of anus   TEE WITHOUT CARDIOVERSION N/A 10/16/2021   Procedure: TRANSESOPHAGEAL ECHOCARDIOGRAM (TEE);  Surgeon: Chrystie Nose, MD;  Location: [facility];  Service: Cardiovascular;  Laterality: N/A;   TOOTH EXTRACTION N/A 12/04/2021   Procedure: DENTAL RESTORATION/EXTRACTIONS;  Surgeon: Ocie Doyne, DMD;  Location: [facility];  Service: Oral Surgery;  Laterality: N/A;   transanal excision  10/14/2011   Dr.Todd Gerkin       Home Medications    Prior to Admission medications   Medication Sig Start Date End Date Taking? Authorizing Provider  albuterol (VENTOLIN HFA) 108 (90 Base) MCG/ACT inhaler Inhale 2 puffs into the lungs every 6 (six) hours as needed for wheezing or shortness of breath. 07/23/22  Yes Valinda Hoar, NP  apixaban (ELIQUIS) 2.5 MG TABS tablet Take 1 tablet by mouth twice daily 04/07/23  Yes Manson Passey,  Erma Pinto, NP  METHADONE HCL PO Take 56 mg by mouth daily.   Yes [provider]  Multiple Vitamin (MULTIVITAMIN ADULT PO) Take by mouth daily.   Yes [provider]  mupirocin ointment (BACTROBAN) 2 % Apply 1 Application topically 2 (two) times daily. 05/08/23  Yes Carlisle Beers, FNP  atorvastatin (LIPITOR) 10 MG tablet Take 1 tablet (10 mg total) by mouth daily. Additional refills per primary provider Patient not taking: Reported on 05/05/2023 11/04/22 11/04/23  Schnier, Latina Craver, MD  clopidogrel (PLAVIX) 75 MG tablet Take 1 tablet (75 mg total) by mouth daily. 10/07/22   Georgiana Spinner, NP  EPINEPHrine (EPIPEN 2-PAK) 0.3 mg/0.3 mL IJ SOAJ injection Inject 0.3 mg (1 pen) into the muscle as needed for anaphylaxis. 12/30/22   Rema Fendt, NP  HYDROcodone-acetaminophen (NORCO) 7.5-325 MG tablet Take 1-2 tablets by mouth daily as needed for moderate pain. Patient not taking: Reported on 05/05/2023 02/03/23   Tarry Kos, MD  tamsulosin (FLOMAX) 0.4 MG CAPS capsule Take 1 capsule (0.4 mg total) by mouth at bedtime. 09/16/22    Georganna Skeans, MD  sucralfate (CARAFATE) 1 GM/10ML suspension Take 10 mLs (1 g total) by mouth 4 (four) times daily -  with meals and at bedtime. Patient not taking: Reported on 11/28/2019 10/21/19 06/13/20  Nicanor Alcon, April, MD    Family History Family History  Problem Relation Age of Onset   Colon cancer Father    Asthma Father    Cancer Father        prostate   Hypertension Father    Asthma Mother    Hypertension Mother    Stomach cancer Neg Hx    Esophageal cancer Neg Hx     Social History Social History   Tobacco Use   Smoking status: Every Day    Current packs/day: 1.00    Average packs/day: 1 pack/day for 47.0 years (47.0 ttl pk-yrs)    Types: Cigarettes   Smokeless tobacco: Never   Tobacco comments:    Cutting back>> Quit Smart card supplied  Vaping Use   Vaping status: Never Used  Substance Use Topics   Alcohol use: Not Currently    Comment: occasional   Drug use: Yes    Types: Marijuana, "Crack" cocaine    Comment: hx cocaine use- states stopped 6 months ago but last positive drug screen 09/2022     Allergies   Aspirin, Yellow jacket venom [bee venom], Codeine, Ibuprofen, and Morphine and codeine   Review of Systems Review of Systems Per HPI  Physical Exam Triage Vital Signs ED Triage Vitals  Encounter Vitals Group     BP 05/08/23 1641 (!) 159/69     Systolic BP Percentile --      Diastolic BP Percentile --      Pulse Rate 05/08/23 1641 66     Resp 05/08/23 1641 20     Temp 05/08/23 1641 98 F (36.7 C)     Temp src --      SpO2 05/08/23 1641 97 %     Weight --      Height --      Head Circumference --      Peak Flow --      Pain Score 05/08/23 1643 6     Pain Loc --      Pain Education --      Exclude from Growth Chart --    No data found.  Updated Vital Signs BP (!) 159/69  Pulse 66   Temp 98 F (36.7 C)   Resp 20   SpO2 97%   Visual Acuity Right Eye Distance:   Left Eye Distance:   Bilateral Distance:    Right Eye Near:    Left Eye Near:    Bilateral Near:     Physical Exam Vitals and nursing note reviewed.  Constitutional:      Appearance: He is not ill-appearing or toxic-appearing.  HENT:     Head: Normocephalic and atraumatic.     Right Ear: Hearing and external ear normal.     Left Ear: Hearing and external ear normal.     Nose: Nose normal.     Mouth/Throat:     Lips: Pink.  Eyes:     General: Lids are normal. Vision grossly intact. Gaze aligned appropriately.     Extraocular Movements: Extraocular movements intact.     Conjunctiva/sclera: Conjunctivae normal.  Cardiovascular:     Rate and Rhythm: Normal rate and regular rhythm.     Heart sounds: Normal heart sounds, S1 normal and S2 normal.  Pulmonary:     Effort: Pulmonary effort is normal. No respiratory distress.     Breath sounds: Normal breath sounds and air entry.  Musculoskeletal:     Cervical back: Neck supple.  Skin:    General: Skin is warm and dry.     Capillary Refill: Capillary refill takes less than 2 seconds.     Findings: Lesion present. No rash.       Neurological:     General: No focal deficit present.     Mental Status: He is alert and oriented to person, place, and time. Mental status is at baseline.     Cranial Nerves: No dysarthria or facial asymmetry.  Psychiatric:        Mood and Affect: Mood normal.        Speech: Speech normal.        Behavior: Behavior normal.        Thought Content: Thought content normal.        Judgment: Judgment normal.         UC Treatments / Results  Labs (all labs ordered are listed, but only abnormal results are displayed) Labs Reviewed - No data to display  EKG   Radiology No results found.  Procedures Procedures (including critical care time)  Medications Ordered in UC Medications - No data to display  Initial Impression / Assessment and Plan / UC Course  I have reviewed the triage vital signs and the nursing notes.  Pertinent labs & imaging results that  were available during my care of the patient were reviewed by me and considered in my medical decision making (see chart for details).   1.  Bug bite Mupirocin twice daily for 7 days to the site.  Infection return precautions discussed should he experience worsening swelling, redness, or warmth to the site.   Counseled patient on potential for adverse effects with medications prescribed/recommended today, strict ER and return-to-clinic precautions discussed, patient verbalized understanding.    Final Clinical Impressions(s) / UC Diagnoses   Final diagnoses:  Bug bite, initial encounter     Discharge Instructions      By mupirocin ointment to the wound twice a day for the next 7 days.  Gently cleanse the wound with warm water twice a day.  If you notice any worsening swelling, redness, pus, or pain to the site, please return to urgent care for reevaluation as you may  need oral antibiotics at that time.  If you develop any new or worsening symptoms or if your symptoms do not start to improve, please return here or follow-up with your primary care provider. If your symptoms are severe, please go to the emergency room.    ED Prescriptions     Medication Sig Dispense Auth. Provider   mupirocin ointment (BACTROBAN) 2 % Apply 1 Application topically 2 (two) times daily. 22 g Carlisle Beers, FNP      PDMP not reviewed this encounter.   Carlisle Beers, FNP 05/08/23 1817    Carlisle Beers, FNP 05/08/23 1819

## 2023-05-11 ENCOUNTER — Ambulatory Visit (HOSPITAL_COMMUNITY)
Admission: EM | Admit: 2023-05-11 | Discharge: 2023-05-11 | Disposition: A | Discharge status: Home / Self Care | Payer: Medicare HMO | Attending: Nurse Practitioner | Admitting: Nurse Practitioner

## 2023-05-11 ENCOUNTER — Encounter (HOSPITAL_COMMUNITY): Payer: Self-pay | Admitting: *Deleted

## 2023-05-11 ENCOUNTER — Other Ambulatory Visit: Payer: Self-pay

## 2023-05-11 DIAGNOSIS — Z1152 Encounter for screening for COVID-19: Secondary | ICD-10-CM | POA: Insufficient documentation

## 2023-05-11 DIAGNOSIS — J441 Chronic obstructive pulmonary disease with (acute) exacerbation: Secondary | ICD-10-CM | POA: Diagnosis not present

## 2023-05-11 LAB — POCT INFLUENZA A/B
Influenza A, POC: NEGATIVE
Influenza B, POC: NEGATIVE

## 2023-05-11 MED ORDER — BENZONATATE 100 MG PO CAPS
100.0000 mg | ORAL_CAPSULE | Freq: Three times a day (TID) | ORAL | 0 refills | Status: DC | PRN
Start: 2023-05-11 — End: 2023-06-14

## 2023-05-11 MED ORDER — PREDNISONE 20 MG PO TABS
40.0000 mg | ORAL_TABLET | Freq: Every day | ORAL | 0 refills | Status: AC
Start: 2023-05-11 — End: 2023-05-16

## 2023-05-11 MED ORDER — AZITHROMYCIN 250 MG PO TABS: ORAL_TABLET | ORAL | 0 refills | Status: DC

## 2023-05-11 MED ORDER — ALBUTEROL SULFATE HFA 108 (90 BASE) MCG/ACT IN AERS: 2.0000 | INHALATION_SPRAY | Freq: Four times a day (QID) | RESPIRATORY_TRACT | 0 refills | Status: AC | PRN

## 2023-05-11 NOTE — ED Provider Notes (Signed)
[facility]    CSN: [id number] Arrival date & time: 05/11/23  1600      History   Chief Complaint Chief Complaint  Patient presents with   Cough   Nasal Congestion   Muscle Pain    HPI Martin [name] is a 62 y.o. male.   Patient presents today with 3-day history of bodyaches, chills, congested cough, shortness of breath and wheezing, chest tightness, chest congestion, stuffy nose, sore throat, decreased appetite, and fatigue.  Reports the mucus he is blowing out of his nose and that he is coughing up is yellow.  He denies known fevers at home, chest pain, runny nose, headache, ear pain, abdominal pain, nausea/vomiting, or diarrhea.  No known sick contacts.  Reports he feels like he has the flu.  Has taken over-the-counter Mucinex DM with some improvement.  Reports he ran out of his inhaler 2 days ago.  Reports history of COPD.  Patient reports he has follow-up scheduled with new primary care provider in approximately 2 days.    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    Anxiety    Arthritis    DDD lumbar, arthritis knees   Complication of anesthesia    pt states he woke up during a vascular procedure   COPD (chronic obstructive pulmonary disease) (HCC)    DJD (degenerative joint disease) of lumbar spine    GERD (gastroesophageal reflux disease)    Hemorrhoid    internal   History of bowel resection 04/19/2017   Hyperlipidemia    Hypertension    Pt states he's never been told or treated for HTN   Illicit drug use    + UDS cocaine, THC, opiates 10/11/21   Inguinal hernia    MSSA bacteremia 10/11/2021   s/p IV cefazolin, po Linezolid; 10/16/21 TEE w/o vegetations.   Peripheral vascular disease (HCC)    Pneumonia    as child, cough at present with no fever   Pneumonia    as child, cough at present with no fever    Radiation 11/19/11-01/08/12   5040 cGy 28 fx Pelvis and  inguinal area   Stroke [facility])    no weaknes or paralysis   Substance abuse (HCC)    last use was Jan. 2024; patient states he has "been clean" since then    Patient Active Problem List   Diagnosis Date Noted   Carpal tunnel syndrome on right 02/03/2023   Right carpal tunnel syndrome 10/28/2022   Protein-calorie malnutrition, severe 04/29/2022   Cellulitis of right lower extremity 04/28/2022   Sepsis 10/12/2021   Peripheral vascular disease    AKI    Tobacco use    COPD (chronic obstructive pulmonary disease) (HCC)    BPH     Periodontal disease    IVDU, cocaine use    Demand ischemia    Thrombocytopenia    Normocytic anemia    MSSA bacteremia 10/11/2021   Callus 06/12/2021   Hav (hallux abducto valgus), unspecified laterality 06/12/2021   Hammer toes, bilateral 06/12/2021   Atherosclerosis of native arteries of extremity with intermittent claudication (HCC) 06/12/2021   Hyperlipidemia 06/07/2021   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   Elevated blood-pressure reading, without diagnosis of hypertension 09/26/2020   Body mass index (BMI) 29.0-29.9, adult 08/29/2020   Partial small bowel obstruction (HCC) 03/10/2020   Small bowel obstruction due to adhesions (HCC)  03/08/2020   Enteritis 10/15/2019   Abnormal liver function 10/15/2019   Leukocytosis 10/15/2019   Nausea & vomiting 10/15/2019   GAD (generalized anxiety disorder) 05/18/2017   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   Constipation 04/11/2017   Bee sting 04/05/2017   Diverticulosis 11/29/2014   Healthcare maintenance 11/06/2014   Cough 10/05/2014   Insomnia 09/13/2014   Erectile dysfunction 11/30/2013   Chronic pain syndrome 11/30/2013   Depression 11/30/2013   GERD (gastroesophageal reflux disease)    Hypertension    Anxiety    Arthritis    Osteoarthritis of lumbar spine    Radiation    Night sweats 08/12/2012   Tobacco abuse counseling 05/26/2012   Lymphocytic colitis 05/03/2012   Vitamin B12  deficiency 03/19/2012   Folate deficiency 03/19/2012   Inguinal hernia 11/24/2011   Cancer (HCC) 10/14/2011   Internal hemorrhoid 09/11/2011   Anal cancer 10/09/2010    Past Surgical History:  Procedure Laterality Date   APPENDECTOMY     age 65 or 76   CARPAL TUNNEL RELEASE Right 02/04/2023   Procedure: RIGHT CARPAL TUNNEL RELEASE;  Surgeon: Tarry Kos, MD;  Location: [facility];  Service: Orthopedics;  Laterality: Right;   EXAMINATION UNDER ANESTHESIA  10/14/2011   Procedure: EXAM UNDER ANESTHESIA;  Surgeon: Velora Heckler, MD;  Location: [facility];  Service: General;  Laterality: N/A;  exam under anethesia, Excision of mass anal canal, 1.5cm   INGUINAL HERNIA REPAIR  05/11/2012   Procedure: HERNIA REPAIR INGUINAL ADULT;  Surgeon: Velora Heckler, MD;  Location: [facility];  Service: General;  Laterality: Left;  left inguinal hernia repair with mesh   LOWER EXTREMITY ANGIOGRAPHY Left 07/02/2021   Procedure: LOWER EXTREMITY ANGIOGRAPHY;  Surgeon: Renford Dills, MD;  Location: [facility];  Service: Cardiovascular;  Laterality: Left;   LOWER EXTREMITY ANGIOGRAPHY Right 07/16/2021   Procedure: LOWER EXTREMITY ANGIOGRAPHY;  Surgeon: Renford Dills, MD;  Location: [facility];  Service: Cardiovascular;  Laterality: Right;   LOWER EXTREMITY ANGIOGRAPHY Left 08/27/2021   Procedure: LOWER EXTREMITY ANGIOGRAPHY;  Surgeon: Renford Dills, MD;  Location: [facility];  Service: Cardiovascular;  Laterality: Left;   LOWER EXTREMITY ANGIOGRAPHY Left 01/14/2022   Procedure: Lower Extremity Angiography;  Surgeon: Renford Dills, MD;  Location: [facility];  Service: Cardiovascular;  Laterality: Left;   LOWER EXTREMITY ANGIOGRAPHY Left 11/04/2022   Procedure: Lower Extremity Angiography;  Surgeon: Renford Dills, MD;  Location: [facility];  Service: Cardiovascular;  Laterality: Left;   SMALL INTESTINE SURGERY   04/19/2017   exploratory lap, partial small bowel resection for perforated ulcer/abcess, radiation enteritis   surgical pathology   10/14/2011   squamous cell ca of anus   TEE WITHOUT CARDIOVERSION N/A 10/16/2021   Procedure: TRANSESOPHAGEAL ECHOCARDIOGRAM (TEE);  Surgeon: Chrystie Nose, MD;  Location: [facility];  Service: Cardiovascular;  Laterality: N/A;   TOOTH EXTRACTION N/A 12/04/2021   Procedure: DENTAL RESTORATION/EXTRACTIONS;  Surgeon: Ocie Doyne, DMD;  Location: [facility];  Service: Oral Surgery;  Laterality: N/A;   transanal excision  10/14/2011   Dr.Todd Gerkin       Home Medications    Prior to Admission medications   Medication Sig Start Date End Date Taking? Authorizing Provider  apixaban (ELIQUIS) 2.5 MG TABS tablet Take 1 tablet by mouth twice daily 04/07/23  Yes Georgiana Spinner, NP  atorvastatin (LIPITOR) 10 MG tablet Take 1 tablet (10 mg total) by mouth  daily. Additional refills per primary provider 11/04/22 11/04/23 Yes Schnier, Latina Craver, MD  azithromycin (ZITHROMAX) 250 MG tablet Take (2) tablets by mouth on day 1, then take (1) tablet by mouth on days 2-5. 05/11/23  Yes Valentino Nose, NP  benzonatate (TESSALON) 100 MG capsule Take 1 capsule (100 mg total) by mouth 3 (three) times daily as needed for cough. Do not take with alcohol or while driving or operating heavy machinery.  May cause drowsiness. 05/11/23  Yes Valentino Nose, NP  clopidogrel (PLAVIX) 75 MG tablet Take 1 tablet (75 mg total) by mouth daily. 10/07/22  Yes Georgiana Spinner, NP  METHADONE HCL PO Take 56 mg by mouth daily.   Yes [provider]  Multiple Vitamin (MULTIVITAMIN ADULT PO) Take by mouth daily.   Yes [provider]  predniSONE (DELTASONE) 20 MG tablet Take 2 tablets (40 mg total) by mouth daily with breakfast for 5 days. 05/11/23 05/16/23 Yes Valentino Nose, NP  tamsulosin (FLOMAX) 0.4 MG CAPS capsule Take 1 capsule (0.4 mg total) by mouth at bedtime. 09/16/22  Yes  Georganna Skeans, MD  albuterol (VENTOLIN HFA) 108 (90 Base) MCG/ACT inhaler Inhale 2 puffs into the lungs every 6 (six) hours as needed for wheezing or shortness of breath. 05/11/23   Valentino Nose, NP  EPINEPHrine (EPIPEN 2-PAK) 0.3 mg/0.3 mL IJ SOAJ injection Inject 0.3 mg (1 pen) into the muscle as needed for anaphylaxis. 12/30/22   Rema Fendt, NP  HYDROcodone-acetaminophen (NORCO) 7.5-325 MG tablet Take 1-2 tablets by mouth daily as needed for moderate pain. Patient not taking: Reported on 05/05/2023 02/03/23   Tarry Kos, MD  mupirocin ointment (BACTROBAN) 2 % Apply 1 Application topically 2 (two) times daily. 05/08/23   Carlisle Beers, FNP  sucralfate (CARAFATE) 1 GM/10ML suspension Take 10 mLs (1 g total) by mouth 4 (four) times daily -  with meals and at bedtime. Patient not taking: Reported on 11/28/2019 10/21/19 06/13/20  Nicanor Alcon, April, MD    Family History Family History  Problem Relation Age of Onset   Colon cancer Father    Asthma Father    Cancer Father        prostate   Hypertension Father    Asthma Mother    Hypertension Mother    Stomach cancer Neg Hx    Esophageal cancer Neg Hx     Social History Social History   Tobacco Use   Smoking status: Every Day    Current packs/day: 1.00    Average packs/day: 1 pack/day for 47.0 years (47.0 ttl pk-yrs)    Types: Cigarettes   Smokeless tobacco: Never   Tobacco comments:    Cutting back>> Quit Smart card supplied  Vaping Use   Vaping status: Never Used  Substance Use Topics   Alcohol use: Not Currently    Comment: occasional   Drug use: Yes    Types: Marijuana, "Crack" cocaine    Comment: hx cocaine use- states stopped 6 months ago but last positive drug screen 09/2022     Allergies   Aspirin, Yellow jacket venom [bee venom], Codeine, Ibuprofen, and Morphine and codeine   Review of Systems Review of Systems Per HPI  Physical Exam Triage Vital Signs ED Triage Vitals  Encounter Vitals Group      BP 05/11/23 1620 (!) 172/54     Systolic BP Percentile --      Diastolic BP Percentile --      Pulse Rate 05/11/23 1620  71     Resp 05/11/23 1620 20     Temp 05/11/23 1620 98.4 F (36.9 C)     Temp src --      SpO2 05/11/23 1620 98 %     Weight --      Height --      Head Circumference --      Peak Flow --      Pain Score 05/11/23 1618 7     Pain Loc --      Pain Education --      Exclude from Growth Chart --    No data found.  Updated Vital Signs BP (!) 172/54   Pulse 71   Temp 98.4 F (36.9 C)   Resp 20   SpO2 98%   Visual Acuity Right Eye Distance:   Left Eye Distance:   Bilateral Distance:    Right Eye Near:   Left Eye Near:    Bilateral Near:     Physical Exam Vitals and nursing note reviewed.  Constitutional:      General: He is not in acute distress.    Appearance: Normal appearance. He is underweight. He is not ill-appearing or toxic-appearing.     Comments: Chronically ill-appearing, appears older than stated age  HENT:     Head: Normocephalic and atraumatic.     Right Ear: Tympanic membrane, ear canal and external ear normal.     Left Ear: Tympanic membrane, ear canal and external ear normal.     Nose: No congestion or rhinorrhea.     Mouth/Throat:     Mouth: Mucous membranes are moist.     Pharynx: Oropharynx is clear. Posterior oropharyngeal erythema present. No oropharyngeal exudate.  Eyes:     General: No scleral icterus.    Extraocular Movements: Extraocular movements intact.  Cardiovascular:     Rate and Rhythm: Normal rate and regular rhythm.  Pulmonary:     Effort: Pulmonary effort is normal. No respiratory distress.     Breath sounds: Wheezing present. No rhonchi or rales.  Abdominal:     General: Abdomen is flat. Bowel sounds are normal. There is no distension.     Palpations: Abdomen is soft.  Musculoskeletal:     Cervical back: Normal range of motion and neck supple.  Lymphadenopathy:     Cervical: No cervical adenopathy.   Skin:    General: Skin is warm and dry.     Coloration: Skin is not jaundiced or pale.     Findings: No erythema or rash.  Neurological:     Mental Status: He is alert and oriented to person, place, and time.  Psychiatric:        Behavior: Behavior is cooperative.      UC Treatments / Results  Labs (all labs ordered are listed, but only abnormal results are displayed) Labs Reviewed  SARS CORONAVIRUS 2 (TAT 6-24 HRS)  POCT INFLUENZA A/B    EKG   Radiology No results found.  Procedures Procedures (including critical care time)  Medications Ordered in UC Medications - No data to display  Initial Impression / Assessment and Plan / UC Course  I have reviewed the triage vital signs and the nursing notes.  Pertinent labs & imaging results that were available during my care of the patient were reviewed by me and considered in my medical decision making (see chart for details).   Patient is well-appearing, afebrile, not tachycardic, not tachypneic, oxygenating well on room air.  Patient is mildly hypertensive  in urgent care today.  1. COPD exacerbation (HCC) 2. Encounter for screening for COVID-19 Influenza test is negative today COVID-19 test is pending, patient is a good candidate for antiviral therapy if he tests positive Overall, vitals and exam are reassuring Given history of COPD, productive mucus increase, start azithromycin and oral prednisone Refill given for albuterol inhaler to use every 4-6 hours as needed for wheezing or shortness of breath In addition, start pulmonary toilet with guaifenesin and Tessalon Perles, increase hydration with water Strict return and ER precautions discussed Recommended follow-up here with PCP in 48 hours recheck to ensure symptoms are improving ER in meantime if symptoms worsen  The patient was given the opportunity to ask questions.  All questions answered to their satisfaction.  The patient is in agreement to this plan.    Final  Clinical Impressions(s) / UC Diagnoses   Final diagnoses:  COPD exacerbation (HCC)  Encounter for screening for COVID-19     Discharge Instructions      You have an exacerbation of your chronic breathing likely due to a viral infection.  The influenza test today was negative.  We have tested you today for COVID-19.  You will see the results in Mychart and we will contact you with positive results.  Please stay home and isolate until you are fever free without fever reducing medicine for 24 hours.  In addition, continue albuterol inhaler every 4-6 hours as needed for wheezing or shortness of breath.  Start the azithromycin tonight.  Start the oral prednisone tomorrow.  You can use a cough Perles every 8 hours as needed for coughing.  Some things that can make you feel better are: - Increased rest - Increasing fluid with water/sugar free electrolytes - Acetaminophen and ibuprofen as needed for fever/pain - Salt water gargling, chloraseptic spray and throat lozenges - OTC guaifenesin (Mucinex) 600 mg twice daily - Saline sinus flushes or a neti pot - Humidifying the air  Follow-up with PCP as planned in 48 hours for re-evaluation, seek care sooner emergently if symptoms worsen   ED Prescriptions     Medication Sig Dispense Auth. Provider   albuterol (VENTOLIN HFA) 108 (90 Base) MCG/ACT inhaler Inhale 2 puffs into the lungs every 6 (six) hours as needed for wheezing or shortness of breath. 18 g Cathlean Marseilles A, NP   benzonatate (TESSALON) 100 MG capsule Take 1 capsule (100 mg total) by mouth 3 (three) times daily as needed for cough. Do not take with alcohol or while driving or operating heavy machinery.  May cause drowsiness. 30 capsule Cathlean Marseilles A, NP   predniSONE (DELTASONE) 20 MG tablet Take 2 tablets (40 mg total) by mouth daily with breakfast for 5 days. 10 tablet Cathlean Marseilles A, NP   azithromycin (ZITHROMAX) 250 MG tablet Take (2) tablets by mouth on day 1, then  take (1) tablet by mouth on days 2-5. 6 tablet Valentino Nose, NP      I have reviewed the PDMP during this encounter.   Valentino Nose, NP 05/11/23 1755

## 2023-05-11 NOTE — Discharge Instructions (Addendum)
You have an exacerbation of your chronic breathing likely due to a viral infection.  The influenza test today was negative.  We have tested you today for COVID-19.  You will see the results in Mychart and we will contact you with positive results.  Please stay home and isolate until you are fever free without fever reducing medicine for 24 hours.  In addition, continue albuterol inhaler every 4-6 hours as needed for wheezing or shortness of breath.  Start the azithromycin tonight.  Start the oral prednisone tomorrow.  You can use a cough Perles every 8 hours as needed for coughing.  Some things that can make you feel better are: - Increased rest - Increasing fluid with water/sugar free electrolytes - Acetaminophen and ibuprofen as needed for fever/pain - Salt water gargling, chloraseptic spray and throat lozenges - OTC guaifenesin (Mucinex) 600 mg twice daily - Saline sinus flushes or a neti pot - Humidifying the air  Follow-up with PCP as planned in 48 hours for re-evaluation, seek care sooner emergently if symptoms worsen

## 2023-05-11 NOTE — ED Triage Notes (Signed)
Pt reports he still feels bad since last visit on 05-08-23. Pt thinks he has the flu . Pt has a cough ,SHOB  and general body aches.

## 2023-05-12 ENCOUNTER — Ambulatory Visit: Payer: Medicare HMO | Admitting: Occupational Therapy

## 2023-05-12 LAB — SARS CORONAVIRUS 2 (TAT 6-24 HRS): SARS Coronavirus 2: NEGATIVE

## 2023-05-18 ENCOUNTER — Ambulatory Visit: Payer: Medicare HMO | Admitting: Occupational Therapy

## 2023-05-18 ENCOUNTER — Encounter: Payer: Self-pay | Admitting: Occupational Therapy

## 2023-05-18 ENCOUNTER — Other Ambulatory Visit (INDEPENDENT_AMBULATORY_CARE_PROVIDER_SITE_OTHER): Payer: Self-pay | Admitting: Nurse Practitioner

## 2023-05-18 DIAGNOSIS — J449 Chronic obstructive pulmonary disease, unspecified: Secondary | ICD-10-CM | POA: Diagnosis not present

## 2023-05-18 DIAGNOSIS — Z79899 Other long term (current) drug therapy: Secondary | ICD-10-CM | POA: Diagnosis not present

## 2023-05-18 DIAGNOSIS — Z1321 Encounter for screening for nutritional disorder: Secondary | ICD-10-CM | POA: Diagnosis not present

## 2023-05-18 MED ORDER — ATORVASTATIN CALCIUM 10 MG PO TABS: 10.0000 mg | ORAL_TABLET | Freq: Every day | ORAL | 3 refills | Status: DC

## 2023-05-19 ENCOUNTER — Encounter: Payer: Self-pay | Admitting: Occupational Therapy

## 2023-05-25 ENCOUNTER — Encounter: Payer: Self-pay | Admitting: Occupational Therapy

## 2023-05-25 DIAGNOSIS — F1120 Opioid dependence, uncomplicated: Secondary | ICD-10-CM | POA: Diagnosis not present

## 2023-06-01 ENCOUNTER — Encounter: Payer: Self-pay | Admitting: Occupational Therapy

## 2023-06-01 DIAGNOSIS — B070 Plantar wart: Secondary | ICD-10-CM | POA: Diagnosis not present

## 2023-06-01 DIAGNOSIS — Z0001 Encounter for general adult medical examination with abnormal findings: Secondary | ICD-10-CM | POA: Diagnosis not present

## 2023-06-01 DIAGNOSIS — Z79899 Other long term (current) drug therapy: Secondary | ICD-10-CM | POA: Diagnosis not present

## 2023-06-01 DIAGNOSIS — I509 Heart failure, unspecified: Secondary | ICD-10-CM | POA: Diagnosis not present

## 2023-06-01 DIAGNOSIS — Z0189 Encounter for other specified special examinations: Secondary | ICD-10-CM | POA: Diagnosis not present

## 2023-06-01 DIAGNOSIS — E261 Secondary hyperaldosteronism: Secondary | ICD-10-CM | POA: Diagnosis not present

## 2023-06-01 DIAGNOSIS — E43 Unspecified severe protein-calorie malnutrition: Secondary | ICD-10-CM | POA: Diagnosis not present

## 2023-06-01 DIAGNOSIS — E785 Hyperlipidemia, unspecified: Secondary | ICD-10-CM | POA: Diagnosis not present

## 2023-06-01 DIAGNOSIS — F1120 Opioid dependence, uncomplicated: Secondary | ICD-10-CM | POA: Diagnosis not present

## 2023-06-01 DIAGNOSIS — D6869 Other thrombophilia: Secondary | ICD-10-CM | POA: Diagnosis not present

## 2023-06-08 DIAGNOSIS — F1120 Opioid dependence, uncomplicated: Secondary | ICD-10-CM | POA: Diagnosis not present

## 2023-06-14 ENCOUNTER — Encounter (HOSPITAL_COMMUNITY): Payer: Self-pay | Admitting: Emergency Medicine

## 2023-06-14 ENCOUNTER — Ambulatory Visit (HOSPITAL_COMMUNITY)
Admission: EM | Admit: 2023-06-14 | Discharge: 2023-06-14 | Disposition: A | Discharge status: Home / Self Care | Payer: Medicare HMO | Attending: Physician Assistant | Admitting: Physician Assistant

## 2023-06-14 DIAGNOSIS — L03116 Cellulitis of left lower limb: Secondary | ICD-10-CM

## 2023-06-14 DIAGNOSIS — W57XXXA Bitten or stung by nonvenomous insect and other nonvenomous arthropods, initial encounter: Secondary | ICD-10-CM

## 2023-06-14 MED ORDER — SULFAMETHOXAZOLE-TRIMETHOPRIM 800-160 MG PO TABS
1.0000 | ORAL_TABLET | Freq: Two times a day (BID) | ORAL | 0 refills | Status: AC
Start: 2023-06-14 — End: 2023-06-21

## 2023-06-14 MED ORDER — CEPHALEXIN 500 MG PO CAPS: 500.0000 mg | ORAL_CAPSULE | Freq: Three times a day (TID) | ORAL | 0 refills | Status: DC

## 2023-06-14 NOTE — ED Provider Notes (Signed)
[facility]    CSN: [id number] Arrival date & time: 06/14/23  1124      History   Chief Complaint Chief Complaint  Patient presents with   Insect Bite    HPI Martin [name] is a 62 y.o. male.   Patient presents today with a weeklong history of painful lesion on his posterior left thigh.  He believes that he was bit by a spider as he has had similar episodes in the past.  He was last treated with mupirocin for similar injury August 2024.  He has been applying mupirocin without improvement of symptoms and increasing pain and swelling.  He reports that the area did drain some purulent and serous fluid but is no longer draining.  He did not see the insect bite him but is concerned that it was a brown recluse as the central area of the wound has gotten larger and more open.  He denies history of MRSA or recurrent skin infections.  He was last treated with antibiotics 05/11/2023 with azithromycin.  He is up-to-date on his tetanus which was last given September 2023.  He denies any fever.  He has had nausea but denies any vomiting; triage note indicates he has vomited several times in the past several days but on further discussion he reports that this was spitting up mucus and denies any emesis.  He reports that pain is rated 10 on a 0-10 pain scale, described as soreness, worse with palpation of the area, no alleviating factors identified.    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    Anxiety    Arthritis    DDD lumbar, arthritis knees   Complication of anesthesia    pt states he woke up during a vascular procedure   COPD (chronic obstructive pulmonary disease) (HCC)    DJD (degenerative joint disease) of lumbar spine    GERD (gastroesophageal reflux disease)    Hemorrhoid    internal   History of bowel resection 04/19/2017   Hyperlipidemia    Hypertension    Pt states he's never  been told or treated for HTN   Illicit drug use    + UDS cocaine, THC, opiates 10/11/21   Inguinal hernia    MSSA bacteremia 10/11/2021   s/p IV cefazolin, po Linezolid; 10/16/21 TEE w/o vegetations.   Peripheral vascular disease (HCC)    Pneumonia    as child, cough at present with no fever   Pneumonia    as child, cough at present with no fever    Radiation 11/19/11-01/08/12   5040 cGy 28 fx Pelvis and inguinal area   Stroke [facility])    no weaknes or paralysis   Substance abuse (HCC)    last use was Jan. 2024; patient states he has "been clean" since then    Patient Active Problem List   Diagnosis Date Noted   Carpal tunnel syndrome on right 02/03/2023   Right carpal tunnel syndrome 10/28/2022   Protein-calorie malnutrition, severe 04/29/2022   Cellulitis of right lower extremity 04/28/2022   Sepsis 10/12/2021   Peripheral vascular disease    AKI    Tobacco use    COPD (chronic obstructive pulmonary disease) (HCC)    BPH     Periodontal disease    IVDU, cocaine use    Demand ischemia    Thrombocytopenia    Normocytic anemia  MSSA bacteremia 10/11/2021   Callus 06/12/2021   Hav (hallux abducto valgus), unspecified laterality 06/12/2021   Hammer toes, bilateral 06/12/2021   Atherosclerosis of native arteries of extremity with intermittent claudication (HCC) 06/12/2021   Hyperlipidemia 06/07/2021   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   Elevated blood-pressure reading, without diagnosis of hypertension 09/26/2020   Body mass index (BMI) 29.0-29.9, adult 08/29/2020   Partial small bowel obstruction (HCC) 03/10/2020   Small bowel obstruction due to adhesions (HCC) 03/08/2020   Enteritis 10/15/2019   Abnormal liver function 10/15/2019   Leukocytosis 10/15/2019   Nausea & vomiting 10/15/2019   GAD (generalized anxiety disorder) 05/18/2017   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   Constipation 04/11/2017   Bee sting 04/05/2017   Diverticulosis 11/29/2014    Healthcare maintenance 11/06/2014   Cough 10/05/2014   Insomnia 09/13/2014   Erectile dysfunction 11/30/2013   Chronic pain syndrome 11/30/2013   Depression 11/30/2013   GERD (gastroesophageal reflux disease)    Hypertension    Anxiety    Arthritis    Osteoarthritis of lumbar spine    Radiation    Night sweats 08/12/2012   Tobacco abuse counseling 05/26/2012   Lymphocytic colitis 05/03/2012   Vitamin B12 deficiency 03/19/2012   Folate deficiency 03/19/2012   Inguinal hernia 11/24/2011   Cancer (HCC) 10/14/2011   Internal hemorrhoid 09/11/2011   Anal cancer 10/09/2010    Past Surgical History:  Procedure Laterality Date   APPENDECTOMY     age 6 or 34   CARPAL TUNNEL RELEASE Right 02/04/2023   Procedure: RIGHT CARPAL TUNNEL RELEASE;  Surgeon: Tarry Kos, MD;  Location: [facility];  Service: Orthopedics;  Laterality: Right;   EXAMINATION UNDER ANESTHESIA  10/14/2011   Procedure: EXAM UNDER ANESTHESIA;  Surgeon: Velora Heckler, MD;  Location: [facility];  Service: General;  Laterality: N/A;  exam under anethesia, Excision of mass anal canal, 1.5cm   INGUINAL HERNIA REPAIR  05/11/2012   Procedure: HERNIA REPAIR INGUINAL ADULT;  Surgeon: Velora Heckler, MD;  Location: [facility];  Service: General;  Laterality: Left;  left inguinal hernia repair with mesh   LOWER EXTREMITY ANGIOGRAPHY Left 07/02/2021   Procedure: LOWER EXTREMITY ANGIOGRAPHY;  Surgeon: Renford Dills, MD;  Location: [facility];  Service: Cardiovascular;  Laterality: Left;   LOWER EXTREMITY ANGIOGRAPHY Right 07/16/2021   Procedure: LOWER EXTREMITY ANGIOGRAPHY;  Surgeon: Renford Dills, MD;  Location: [facility];  Service: Cardiovascular;  Laterality: Right;   LOWER EXTREMITY ANGIOGRAPHY Left 08/27/2021   Procedure: LOWER EXTREMITY ANGIOGRAPHY;  Surgeon: Renford Dills, MD;  Location: [facility];  Service: Cardiovascular;  Laterality: Left;   LOWER  EXTREMITY ANGIOGRAPHY Left 01/14/2022   Procedure: Lower Extremity Angiography;  Surgeon: Renford Dills, MD;  Location: [facility];  Service: Cardiovascular;  Laterality: Left;   LOWER EXTREMITY ANGIOGRAPHY Left 11/04/2022   Procedure: Lower Extremity Angiography;  Surgeon: Renford Dills, MD;  Location: [facility];  Service: Cardiovascular;  Laterality: Left;   SMALL INTESTINE SURGERY  04/19/2017   exploratory lap, partial small bowel resection for perforated ulcer/abcess, radiation enteritis   surgical pathology   10/14/2011   squamous cell ca of anus   TEE WITHOUT CARDIOVERSION N/A 10/16/2021   Procedure: TRANSESOPHAGEAL ECHOCARDIOGRAM (TEE);  Surgeon: Chrystie Nose, MD;  Location: [facility];  Service: Cardiovascular;  Laterality: N/A;   TOOTH EXTRACTION N/A 12/04/2021   Procedure: DENTAL RESTORATION/EXTRACTIONS;  Surgeon: Ocie Doyne,  DMD;  Location: [facility];  Service: Oral Surgery;  Laterality: N/A;   transanal excision  10/14/2011   Dr.Todd Gerkin       Home Medications    Prior to Admission medications   Medication Sig Start Date End Date Taking? Authorizing Provider  cephALEXin (KEFLEX) 500 MG capsule Take 1 capsule (500 mg total) by mouth 3 (three) times daily. 06/14/23  Yes Jammi Morrissette K, PA-C  sulfamethoxazole-trimethoprim (BACTRIM DS) 800-160 MG tablet Take 1 tablet by mouth 2 (two) times daily for 7 days. 06/14/23 06/21/23 Yes Lourene Hoston K, PA-C  albuterol (VENTOLIN HFA) 108 (90 Base) MCG/ACT inhaler Inhale 2 puffs into the lungs every 6 (six) hours as needed for wheezing or shortness of breath. 05/11/23   Valentino Nose, NP  apixaban Everlene Balls) 2.5 MG TABS tablet Take 1 tablet by mouth twice daily 04/07/23   Georgiana Spinner, NP  atorvastatin (LIPITOR) 10 MG tablet Take 1 tablet (10 mg total) by mouth daily. Additional refills per primary provider 05/18/23 05/17/24  Georgiana Spinner, NP  clopidogrel (PLAVIX) 75 MG tablet Take 1 tablet (75 mg total)  by mouth daily. 10/07/22   Georgiana Spinner, NP  EPINEPHrine (EPIPEN 2-PAK) 0.3 mg/0.3 mL IJ SOAJ injection Inject 0.3 mg (1 pen) into the muscle as needed for anaphylaxis. 12/30/22   Rema Fendt, NP  HYDROcodone-acetaminophen (NORCO) 7.5-325 MG tablet Take 1-2 tablets by mouth daily as needed for moderate pain. Patient not taking: Reported on 05/05/2023 02/03/23   Tarry Kos, MD  METHADONE HCL PO Take 56 mg by mouth daily.    [provider]  Multiple Vitamin (MULTIVITAMIN ADULT PO) Take by mouth daily.    [provider]  mupirocin ointment (BACTROBAN) 2 % Apply 1 Application topically 2 (two) times daily. 05/08/23   Carlisle Beers, FNP  tamsulosin (FLOMAX) 0.4 MG CAPS capsule Take 1 capsule (0.4 mg total) by mouth at bedtime. 09/16/22   Georganna Skeans, MD  sucralfate (CARAFATE) 1 GM/10ML suspension Take 10 mLs (1 g total) by mouth 4 (four) times daily -  with meals and at bedtime. Patient not taking: Reported on 11/28/2019 10/21/19 06/13/20  Nicanor Alcon, April, MD    Family History Family History  Problem Relation Age of Onset   Colon cancer Father    Asthma Father    Cancer Father        prostate   Hypertension Father    Asthma Mother    Hypertension Mother    Stomach cancer Neg Hx    Esophageal cancer Neg Hx     Social History Social History   Tobacco Use   Smoking status: Every Day    Current packs/day: 1.00    Average packs/day: 1 pack/day for 47.0 years (47.0 ttl pk-yrs)    Types: Cigarettes   Smokeless tobacco: Never   Tobacco comments:    Cutting back>> Quit Smart card supplied  Vaping Use   Vaping status: Never Used  Substance Use Topics   Alcohol use: Not Currently    Comment: occasional   Drug use: Yes    Types: Marijuana, "Crack" cocaine    Comment: hx cocaine use- states stopped 6 months ago but last positive drug screen 09/2022     Allergies   Aspirin, Yellow jacket venom [bee venom], Codeine, Ibuprofen, and Morphine and  codeine   Review of Systems Review of Systems  Constitutional:  Positive for activity change. Negative for appetite change, fatigue and fever.  Gastrointestinal:  Positive  for nausea. Negative for abdominal pain, diarrhea and vomiting.  Musculoskeletal:  Negative for arthralgias and myalgias.  Skin:  Positive for color change and wound.     Physical Exam Triage Vital Signs ED Triage Vitals  Encounter Vitals Group     BP 06/14/23 1148 (!) 160/60     Systolic BP Percentile --      Diastolic BP Percentile --      Pulse Rate 06/14/23 1148 66     Resp 06/14/23 1148 16     Temp 06/14/23 1148 97.7 F (36.5 C)     Temp Source 06/14/23 1148 Oral     SpO2 06/14/23 1148 97 %     Weight --      Height --      Head Circumference --      Peak Flow --      Pain Score 06/14/23 1146 10     Pain Loc --      Pain Education --      Exclude from Growth Chart --    No data found.  Updated Vital Signs BP (!) 160/60 (BP Location: Left Arm)   Pulse 66   Temp 97.7 F (36.5 C) (Oral)   Resp 16   SpO2 97%   Visual Acuity Right Eye Distance:   Left Eye Distance:   Bilateral Distance:    Right Eye Near:   Left Eye Near:    Bilateral Near:     Physical Exam Vitals reviewed.  Constitutional:      General: He is awake.     Appearance: Normal appearance. He is well-developed. He is not ill-appearing.     Comments: Very pleasant male appears stated age in no acute distress sitting comfortably in exam room  HENT:     Head: Normocephalic and atraumatic.  Cardiovascular:     Rate and Rhythm: Normal rate and regular rhythm.     Heart sounds: Normal heart sounds, S1 normal and S2 normal. No murmur heard. Pulmonary:     Effort: Pulmonary effort is normal.     Breath sounds: No stridor. Wheezing present. No rhonchi or rales.     Comments: Scattered wheezing Abdominal:     Palpations: Abdomen is soft.     Tenderness: There is no abdominal tenderness.  Skin:    Findings: Erythema and  lesion present.          Comments: Posterior left thigh: 1 cm x 1 cm ulcerated lesion with surrounding erythema measuring 5 cm x 3 cm.  No active bleeding or drainage noted.  Neurological:     Mental Status: He is alert.  Psychiatric:        Behavior: Behavior is cooperative.      UC Treatments / Results  Labs (all labs ordered are listed, but only abnormal results are displayed) Labs Reviewed - No data to display  EKG   Radiology No results found.  Procedures Procedures (including critical care time)  Medications Ordered in UC Medications - No data to display  Initial Impression / Assessment and Plan / UC Course  I have reviewed the triage vital signs and the nursing notes.  Pertinent labs & imaging results that were available during my care of the patient were reviewed by me and considered in my medical decision making (see chart for details).     Patient is well-appearing, afebrile, nontoxic, nontachycardic.  Concern for cellulitis given clinical presentation.  We discussed that is possible he was bitten by a spider but  it is difficult to tell based on a wound a week later.  We discussed that if this central area becomes larger or appears necrotic he would need to be seen immediately to arrange follow-up with surgeon for debridement.  He does have several risk factors for MRSA including recent antibiotic use and so will cover with both cephalexin and Bactrim DS.  No indication for dose adjustment based on creatinine from 11/04/2022 of 0.78 and calculated creatinine clearance of 81.03 mL/min.  He is to keep area clean and continue applying mupirocin ointment.  He has plenty of this medication available.  Recommended that he monitor the area of erythema by demarcating it with a permanent marker when he gets home and if it doubles in size within 24 hours or he has worsening symptoms including continued spread despite antibiotics, change in character of central wound, fever,  nausea/vomiting, weakness he needs to be seen immediately.  Strict return precautions given.  Recommended close follow-up with his primary care.  Patient declined work excuse note.  Final Clinical Impressions(s) / UC Diagnoses   Final diagnoses:  Cellulitis of left thigh  Bug bite with infection, initial encounter     Discharge Instructions      We are starting you on antibiotics to treat infection.  Start cephalexin 3 times daily for 1 week.  You should also start sulfamethoxazole/trimethoprim twice daily for 7 days.  If you develop any rash or oral lesions stop the medication to be seen immediately.  Take this with food as it can upset your stomach.  Keep the area clean and apply previously prescribed mupirocin ointment with dressing changes.  Monitor the area of redness and if this doubles in 24-hour, continues to spread after being on antibiotics, you have increasing pain, swelling, change in character of lesion, fever, nausea, vomiting you should be seen immediately.  Follow-up with your primary care next week.     ED Prescriptions     Medication Sig Dispense Auth. Provider   cephALEXin (KEFLEX) 500 MG capsule Take 1 capsule (500 mg total) by mouth 3 (three) times daily. 21 capsule Semiyah Newgent K, PA-C   sulfamethoxazole-trimethoprim (BACTRIM DS) 800-160 MG tablet Take 1 tablet by mouth 2 (two) times daily for 7 days. 14 tablet Lorey Pallett, Noberto Retort, PA-C      PDMP not reviewed this encounter.   Jeani Hawking, PA-C 06/14/23 1224

## 2023-06-14 NOTE — Discharge Instructions (Signed)
We are starting you on antibiotics to treat infection.  Start cephalexin 3 times daily for 1 week.  You should also start sulfamethoxazole/trimethoprim twice daily for 7 days.  If you develop any rash or oral lesions stop the medication to be seen immediately.  Take this with food as it can upset your stomach.  Keep the area clean and apply previously prescribed mupirocin ointment with dressing changes.  Monitor the area of redness and if this doubles in 24-hour, continues to spread after being on antibiotics, you have increasing pain, swelling, change in character of lesion, fever, nausea, vomiting you should be seen immediately.  Follow-up with your primary care next week.

## 2023-06-14 NOTE — ED Triage Notes (Signed)
Pt thinks he got bit by a spider about a week ago. Bite is on left posterior inner thigh. He scratched it and bite is very sore, red and swollen. States he squeezed clear yellow fluid and blood out.  He also states he vomiting  4-5 times since Friday.

## 2023-06-15 DIAGNOSIS — F1120 Opioid dependence, uncomplicated: Secondary | ICD-10-CM | POA: Diagnosis not present

## 2023-06-16 ENCOUNTER — Ambulatory Visit (INDEPENDENT_AMBULATORY_CARE_PROVIDER_SITE_OTHER): Payer: Medicare HMO | Admitting: Podiatry

## 2023-06-16 DIAGNOSIS — M79675 Pain in left toe(s): Secondary | ICD-10-CM

## 2023-06-16 DIAGNOSIS — M2011 Hallux valgus (acquired), right foot: Secondary | ICD-10-CM | POA: Diagnosis not present

## 2023-06-16 DIAGNOSIS — M2012 Hallux valgus (acquired), left foot: Secondary | ICD-10-CM

## 2023-06-16 DIAGNOSIS — L84 Corns and callosities: Secondary | ICD-10-CM

## 2023-06-16 DIAGNOSIS — B351 Tinea unguium: Secondary | ICD-10-CM | POA: Diagnosis not present

## 2023-06-16 DIAGNOSIS — M79674 Pain in right toe(s): Secondary | ICD-10-CM | POA: Diagnosis not present

## 2023-06-16 DIAGNOSIS — M21619 Bunion of unspecified foot: Secondary | ICD-10-CM

## 2023-06-16 MED ORDER — CICLOPIROX 8 % EX SOLN
Freq: Every day | CUTANEOUS | 11 refills | Status: DC
Start: 2023-06-16 — End: 2023-08-09

## 2023-06-16 NOTE — Progress Notes (Signed)
Chief Complaint  Patient presents with   Bunions    LEFT FOOT   Callouses    CALLUS BILAT, CORNS BETWEEN LEFT HALLUX AND 2ND TOE    Nail Problem    RIGHT HALLUX NAIL FUNGUS    HPI: 62 y.o. male presents today with concern of possible fungal bilateral great toenails.  He is interested in starting treatment for this.  He also has a corn in the left first interspace.  He has calluses left submet 2 and right submet 3 that give him pain with ambulation.  He acknowledges that he has bunions.  He wants to avoid surgery.  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    Anxiety    Arthritis    DDD lumbar, arthritis knees   Complication of anesthesia    pt states he woke up during a vascular procedure   COPD (chronic obstructive pulmonary disease) (HCC)    DJD (degenerative joint disease) of lumbar spine    GERD (gastroesophageal reflux disease)    Hemorrhoid    internal   History of bowel resection 04/19/2017   Hyperlipidemia    Hypertension    Pt states he's never been told or treated for HTN   Illicit drug use    + UDS cocaine, THC, opiates 10/11/21   Inguinal hernia    MSSA bacteremia 10/11/2021   s/p IV cefazolin, po Linezolid; 10/16/21 TEE w/o vegetations.   Peripheral vascular disease (HCC)    Pneumonia    as child, cough at present with no fever   Pneumonia    as child, cough at present with no fever    Radiation 11/19/11-01/08/12   5040 cGy 28 fx Pelvis and inguinal area   Stroke [facility])    no weaknes or paralysis   Substance abuse (HCC)    last use was Jan. 2024; patient states he has "been clean" since then    Past Surgical History:  Procedure Laterality Date   APPENDECTOMY     age 47 or 18   CARPAL TUNNEL RELEASE Right 02/04/2023   Procedure: RIGHT CARPAL TUNNEL RELEASE;  Surgeon: Tarry Kos, MD;  Location: [facility];  Service: Orthopedics;  Laterality: Right;    EXAMINATION UNDER ANESTHESIA  10/14/2011   Procedure: EXAM UNDER ANESTHESIA;  Surgeon: Velora Heckler, MD;  Location: [facility];  Service: General;  Laterality: N/A;  exam under anethesia, Excision of mass anal canal, 1.5cm   INGUINAL HERNIA REPAIR  05/11/2012   Procedure: HERNIA REPAIR INGUINAL ADULT;  Surgeon: Velora Heckler, MD;  [facility];  Service: General;  Laterality: Left;  left inguinal hernia repair with mesh   LOWER EXTREMITY ANGIOGRAPHY Left 07/02/2021   Procedure: LOWER EXTREMITY ANGIOGRAPHY;  Surgeon: Renford Dills, MD;  Location: [facility];  Service: Cardiovascular;  Laterality: Left;   LOWER EXTREMITY ANGIOGRAPHY Right 07/16/2021   Procedure: LOWER EXTREMITY ANGIOGRAPHY;  Surgeon: Renford Dills, MD;  Location: [facility];  Service: Cardiovascular;  Laterality: Right;   LOWER EXTREMITY ANGIOGRAPHY Left 08/27/2021   Procedure: LOWER EXTREMITY ANGIOGRAPHY;  Surgeon: Renford Dills, MD;  Location: [facility];  Service: Cardiovascular;  Laterality: Left;   LOWER EXTREMITY ANGIOGRAPHY Left 01/14/2022   Procedure: Lower Extremity Angiography;  Surgeon: Renford Dills, MD;  Location: [facility];  Service:  Cardiovascular;  Laterality: Left;   LOWER EXTREMITY ANGIOGRAPHY Left 11/04/2022   Procedure: Lower Extremity Angiography;  Surgeon: Renford Dills, MD;  Location: [facility];  Service: Cardiovascular;  Laterality: Left;   SMALL INTESTINE SURGERY  04/19/2017   exploratory lap, partial small bowel resection for perforated ulcer/abcess, radiation enteritis   surgical pathology   10/14/2011   squamous cell ca of anus   TEE WITHOUT CARDIOVERSION N/A 10/16/2021   Procedure: TRANSESOPHAGEAL ECHOCARDIOGRAM (TEE);  Surgeon: Chrystie Nose, MD;  Location: [facility];  Service: Cardiovascular;  Laterality: N/A;   TOOTH EXTRACTION N/A 12/04/2021   Procedure: DENTAL RESTORATION/EXTRACTIONS;  Surgeon:  Ocie Doyne, DMD;  Location: [facility];  Service: Oral Surgery;  Laterality: N/A;   transanal excision  10/14/2011   Dr.Todd Gerkin    Allergies  Allergen Reactions   Aspirin Swelling    Lip swelling   Yellow Jacket Venom [Bee Venom] Anaphylaxis   Codeine Nausea Only   Ibuprofen     On blood thinners   Morphine And Codeine Itching    Physical Exam: General: The patient is alert and oriented x3 in no acute distress.  Dermatology: Skin is warm, dry and supple bilateral lower extremities. Interspaces are clear of maceration and debris.  Left first interspace kissing corns noted on the lateral aspect of the hallux IPJ and medial aspect of the second toe.  No ulceration is noted.  Hyperkeratotic lesion noted right submet 3 and left submet 2.  No surrounding erythema is seen.  The hallux nails are 3 mm thick with yellow discoloration, subungual debris, distal onycholysis and pain with compression.  Vascular: Palpable pedal pulses bilaterally. Capillary refill within normal limits.  No appreciable edema.  No erythema or calor.  Neurological: Light touch sensation grossly intact bilateral feet.   Musculoskeletal Exam: There is a bony medial prominence at the first metatarsal head bilateral with lateral deviation of the hallux consistent with hallux abductovalgus with bunion deformity bilateral.  This is moderate in severity.  No crepitus on range of motion of the first MPJ.  There is some limited dorsiflexion at the first MPJ bilateral  Assessment/Plan of Care: 1. Fungal nail infection   2. Corns   3. Bunion   4. Acquired hallux valgus of right foot   5. Acquired hallux valgus of left foot     Meds ordered this encounter  Medications   ciclopirox (PENLAC) 8 % solution    Sig: Apply topically at bedtime. Apply over nail and surrounding skin. Apply daily over previous coat. Remove weekly with polish remover.    Dispense:  6.6 mL    Refill:  11   Discussed clinical findings with patient  today.  The hallux nails were debrided with sterile nail nippers and a power debriding bur.  Will start the patient on prescription ciclopirox lacquer.  He will apply this once daily and then at the end of each week he will remove the lacquer buildup with either nail polish remover or rubbing alcohol along with an [employer].  He will then restart the daily application each week.  The hyperkeratotic lesions were shaved with a sterile #313 blade.  Toe spacers were dispensed for the first interspace to try.  Also placed a felt metatarsal pad under his shoe insoles to help offload the metatarsal heads.  He may benefit from custom orthotics in the future to decrease pressure on these callused areas.  Follow-up 3 months for fungal nail recheck   Daryon Remmert D. Hoyle Barkdull,  DPM, FACFAS [facility]     [address] [place], [place] [address]                Office [phone]  Fax [phone]

## 2023-06-27 ENCOUNTER — Other Ambulatory Visit (INDEPENDENT_AMBULATORY_CARE_PROVIDER_SITE_OTHER): Payer: Self-pay | Admitting: Nurse Practitioner

## 2023-07-03 ENCOUNTER — Encounter: Payer: Self-pay | Admitting: Nurse Practitioner

## 2023-07-03 DIAGNOSIS — F1410 Cocaine abuse, uncomplicated: Secondary | ICD-10-CM

## 2023-07-03 DIAGNOSIS — R109 Unspecified abdominal pain: Secondary | ICD-10-CM

## 2023-07-07 ENCOUNTER — Encounter: Payer: Self-pay | Admitting: Nurse Practitioner

## 2023-07-07 ENCOUNTER — Other Ambulatory Visit: Payer: Self-pay | Admitting: Nurse Practitioner

## 2023-07-07 DIAGNOSIS — K5790 Diverticulosis of intestine, part unspecified, without perforation or abscess without bleeding: Secondary | ICD-10-CM

## 2023-07-09 ENCOUNTER — Encounter: Payer: Self-pay | Admitting: Nurse Practitioner

## 2023-07-22 ENCOUNTER — Inpatient Hospital Stay: Admission: RE | Admit: 2023-07-22 | Payer: Medicare HMO | Source: Ambulatory Visit

## 2023-07-29 NOTE — Telephone Encounter (Signed)
Telephone call  

## 2023-07-31 ENCOUNTER — Ambulatory Visit
Admission: RE | Admit: 2023-07-31 | Discharge: 2023-07-31 | Disposition: A | Discharge status: Home / Self Care | Payer: Medicare HMO | Source: Ambulatory Visit | Attending: Nurse Practitioner | Admitting: Nurse Practitioner

## 2023-07-31 DIAGNOSIS — K5790 Diverticulosis of intestine, part unspecified, without perforation or abscess without bleeding: Secondary | ICD-10-CM

## 2023-07-31 MED ORDER — IOPAMIDOL (ISOVUE-300) INJECTION 61%
500.0000 mL | Freq: Once | INTRAVENOUS | Status: AC | PRN
  Administered 2023-07-31: 100 mL via INTRAVENOUS

## 2023-08-08 IMAGING — DX DG ORTHOPANTOGRAM /PANORAMIC
2 series · 2 of 2 positions shown · non-contrast
Comparison: None.

CLINICAL DATA: Sepsis and periodontal disease.

EXAM:
ORTHOPANTOGRAM/PANORAMIC

[view not recorded (1 of 2)]
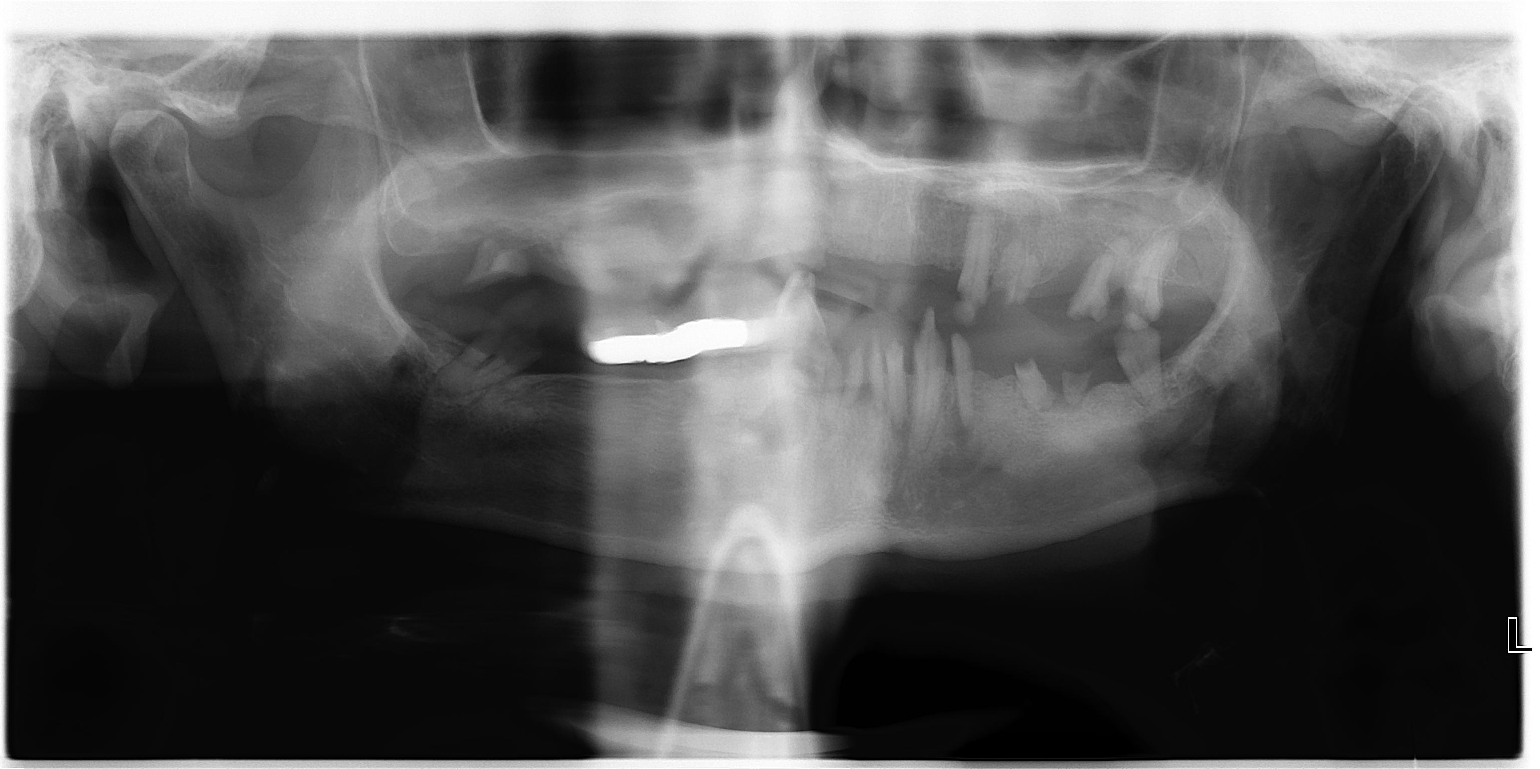

[view not recorded (2 of 2)]
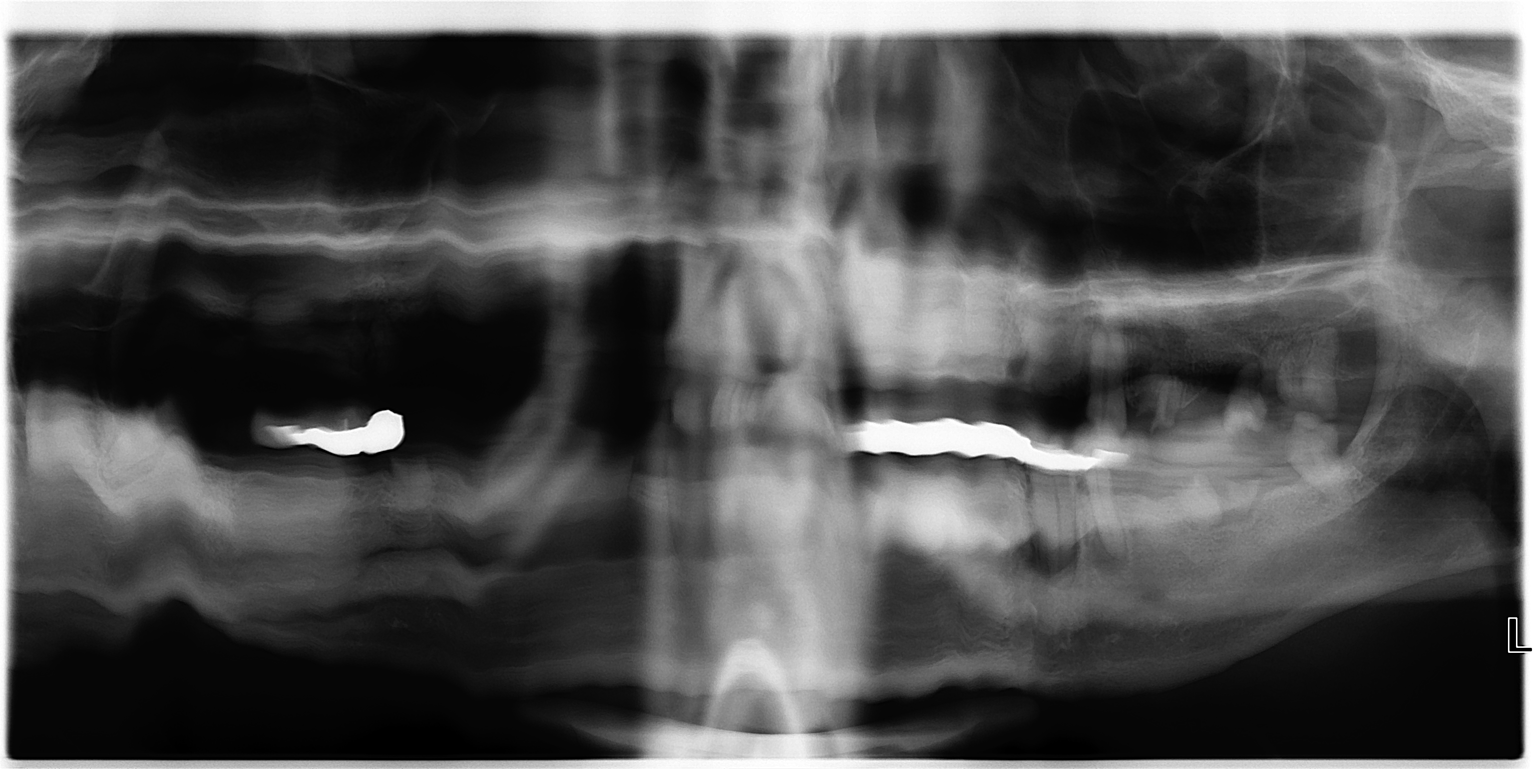

[2 of 2 positions shown; findings below may reference images not displayed]

FINDINGS: Technically limited study due to patient motion despite multiple
retakes. Extremely poor dentition with multiple prior tooth
extractions and extensive dental caries. Periapical lucencies and
bone resorption throughout all the remaining teeth consistent with
periodontal disease. No acute fracture or TMJ dislocation
identified.
IMPRESSION: Limited examination due to patient motion demonstrating diffusely
poor dentition with multiple tooth extractions, dental caries, and
periapical lucencies suggesting periodontal disease.

## 2023-08-08 IMAGING — CR DG CHEST 2V
2 series · 2 of 2 positions shown · non-contrast
Comparison: Chest x-ray 02/09/2021

CLINICAL DATA: Chest pain

EXAM:
CHEST - 2 VIEW

[chest pa]
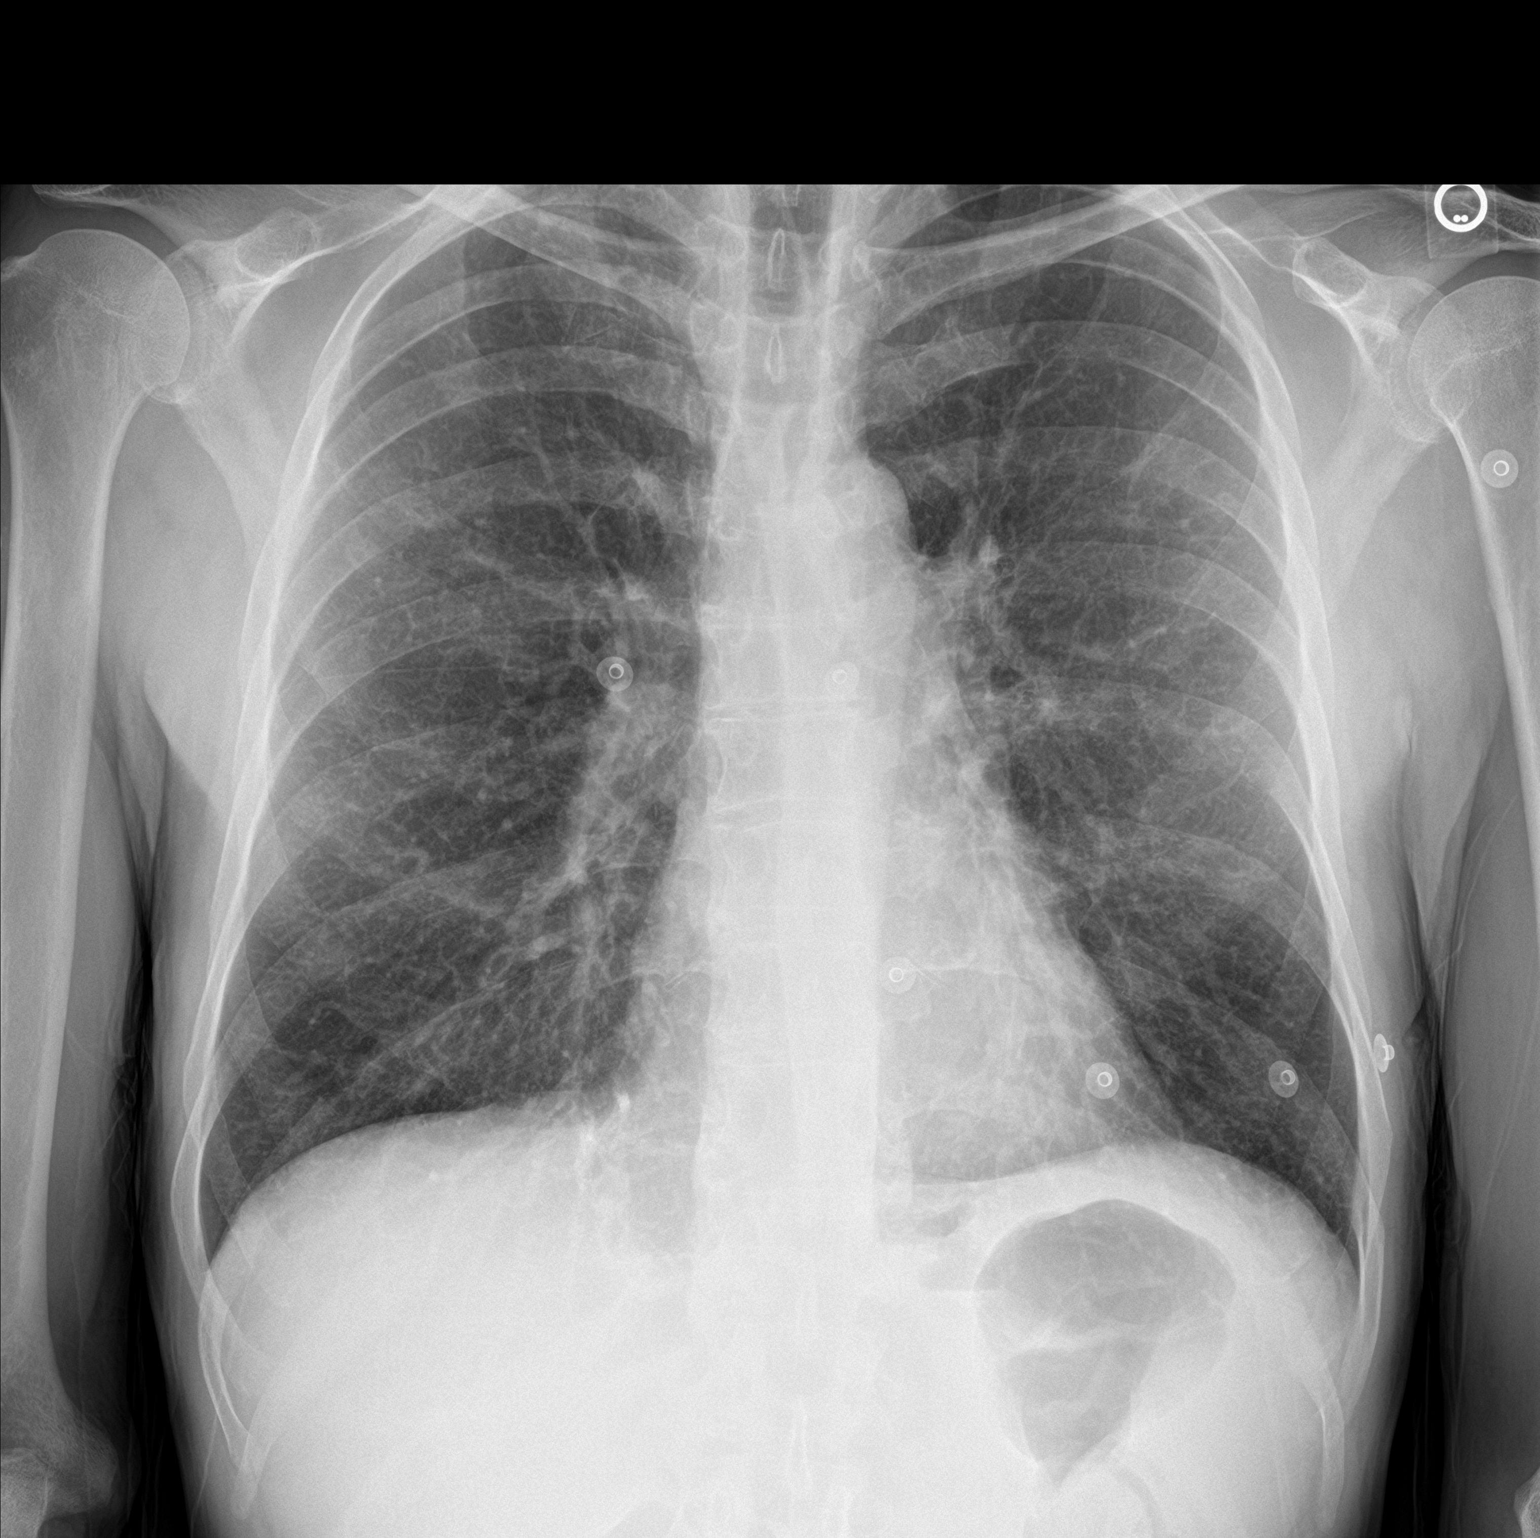

[chest lat]
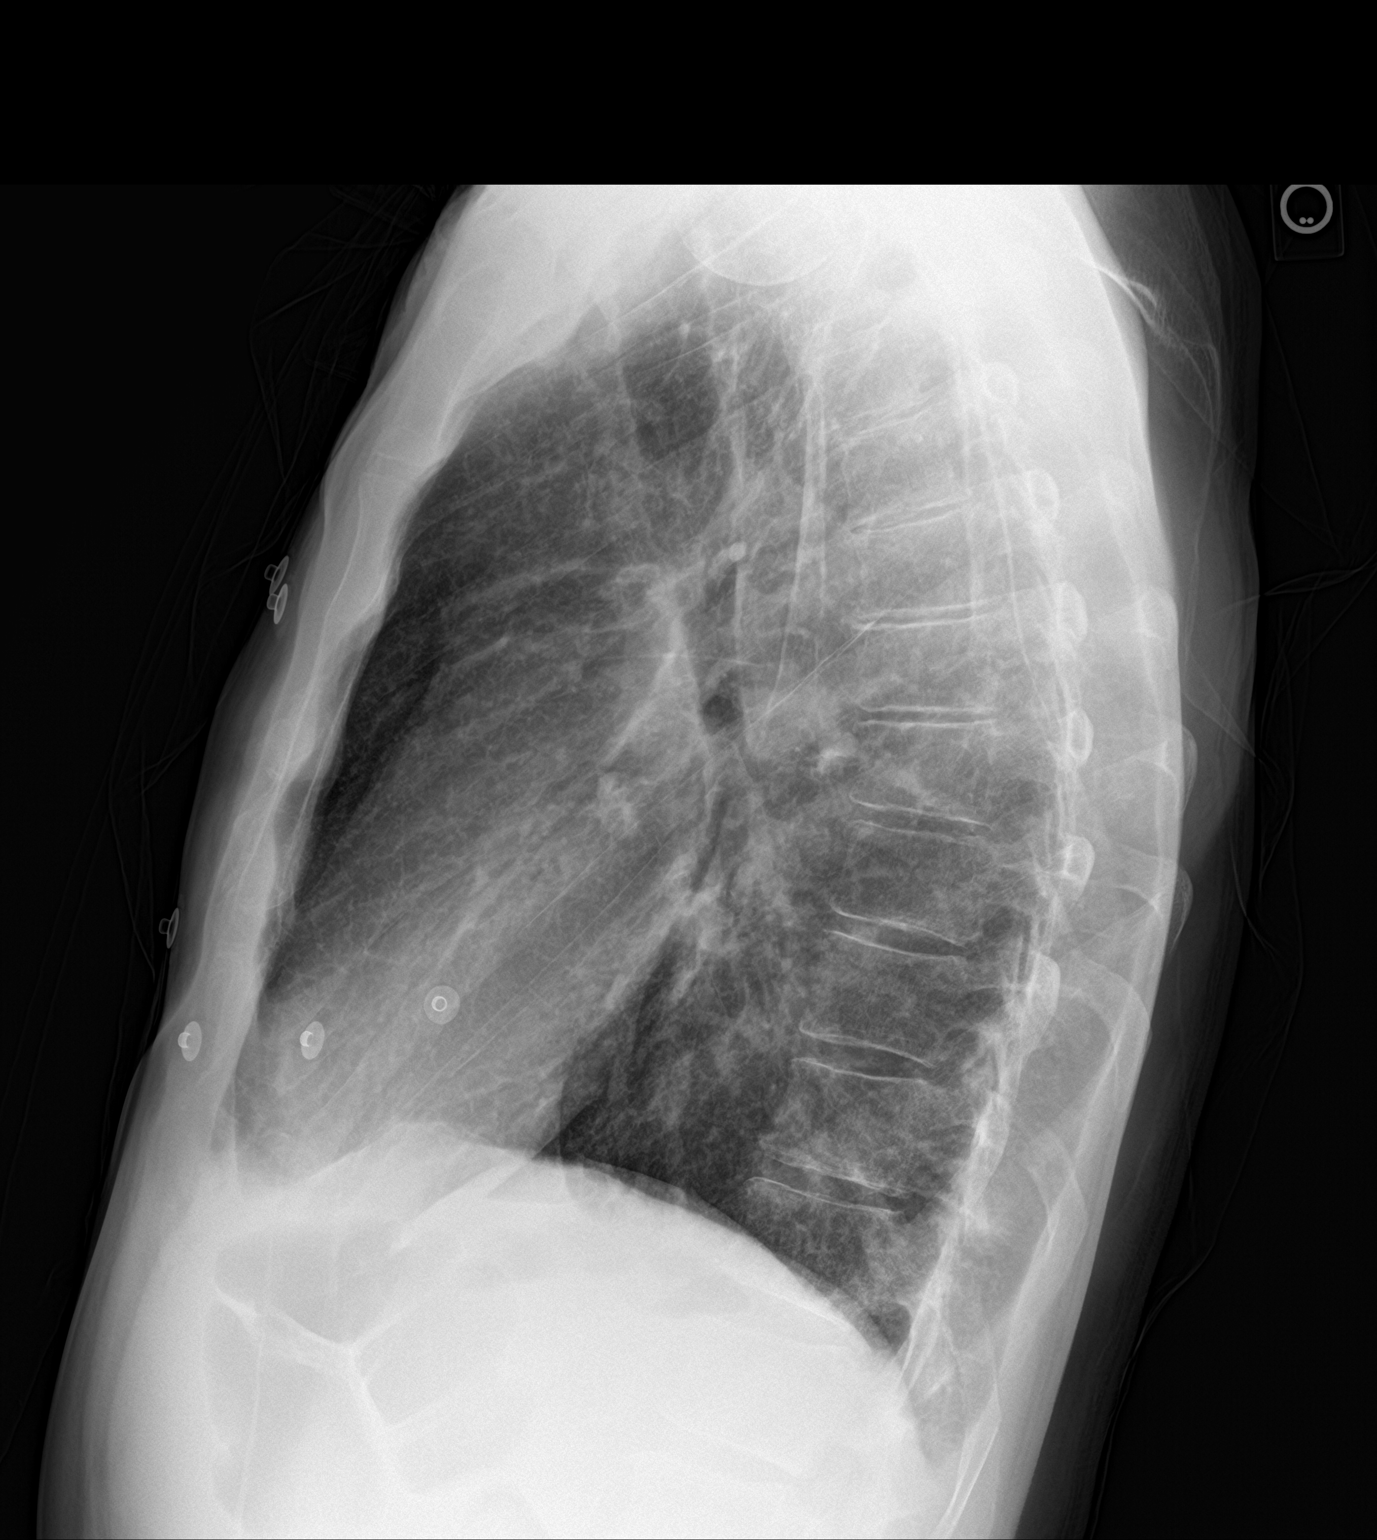

[2 of 2 positions shown; findings below may reference images not displayed]

FINDINGS: Heart size and mediastinal contours are within normal limits.
Hyperinflated lungs. No suspicious pulmonary opacities identified.

No pleural effusion or pneumothorax visualized.

No acute osseous abnormality appreciated.
IMPRESSION: Emphysematous changes with no acute process identified.

## 2023-08-09 ENCOUNTER — Other Ambulatory Visit: Payer: Self-pay

## 2023-08-09 ENCOUNTER — Emergency Department (HOSPITAL_COMMUNITY)
Admission: EM | Admit: 2023-08-09 | Discharge: 2023-08-09 | Disposition: A | Discharge status: Home / Self Care | Payer: Medicare HMO | Attending: Emergency Medicine | Admitting: Emergency Medicine

## 2023-08-09 ENCOUNTER — Encounter (HOSPITAL_COMMUNITY): Payer: Self-pay

## 2023-08-09 DIAGNOSIS — Z85048 Personal history of other malignant neoplasm of rectum, rectosigmoid junction, and anus: Secondary | ICD-10-CM | POA: Diagnosis not present

## 2023-08-09 DIAGNOSIS — Z7951 Long term (current) use of inhaled steroids: Secondary | ICD-10-CM | POA: Insufficient documentation

## 2023-08-09 DIAGNOSIS — Z7901 Long term (current) use of anticoagulants: Secondary | ICD-10-CM | POA: Diagnosis not present

## 2023-08-09 DIAGNOSIS — R040 Epistaxis: Secondary | ICD-10-CM | POA: Insufficient documentation

## 2023-08-09 DIAGNOSIS — J449 Chronic obstructive pulmonary disease, unspecified: Secondary | ICD-10-CM | POA: Insufficient documentation

## 2023-08-09 DIAGNOSIS — X58XXXA Exposure to other specified factors, initial encounter: Secondary | ICD-10-CM | POA: Diagnosis not present

## 2023-08-09 DIAGNOSIS — T171XXA Foreign body in nostril, initial encounter: Secondary | ICD-10-CM | POA: Diagnosis not present

## 2023-08-09 DIAGNOSIS — I10 Essential (primary) hypertension: Secondary | ICD-10-CM | POA: Insufficient documentation

## 2023-08-09 DIAGNOSIS — F17210 Nicotine dependence, cigarettes, uncomplicated: Secondary | ICD-10-CM | POA: Diagnosis not present

## 2023-08-09 MED ORDER — TRANEXAMIC ACID FOR EPISTAXIS
500.0000 mg | Freq: Once | TOPICAL | Status: AC
  Administered 2023-08-09: 500 mg via TOPICAL
  Filled 2023-08-09: qty 10

## 2023-08-09 MED ORDER — LIDOCAINE-EPINEPHRINE (PF) 2 %-1:200000 IJ SOLN
INTRAMUSCULAR | Status: AC
  Filled 2023-08-09: qty 20

## 2023-08-09 MED ORDER — TRANEXAMIC ACID-NACL 1000-0.7 MG/100ML-% IV SOLN
INTRAVENOUS | Status: AC
  Filled 2023-08-09: qty 100

## 2023-08-09 MED ORDER — OXYMETAZOLINE HCL 0.05 % NA SOLN
1.0000 | Freq: Once | NASAL | Status: AC
  Administered 2023-08-09: 1 via NASAL
  Filled 2023-08-09: qty 30

## 2023-08-09 MED ORDER — ONDANSETRON 4 MG PO TBDP
4.0000 mg | ORAL_TABLET | Freq: Once | ORAL | Status: AC
  Administered 2023-08-09: 4 mg via ORAL
  Filled 2023-08-09: qty 1

## 2023-08-09 MED ORDER — ONDANSETRON HCL 4 MG PO TABS: 4.0000 mg | ORAL_TABLET | ORAL | 0 refills | Status: DC | PRN

## 2023-08-09 MED ORDER — LIDOCAINE-EPINEPHRINE 2 %-1:100000 IJ SOLN
20.0000 mL | Freq: Once | INTRAMUSCULAR | Status: AC
  Administered 2023-08-09: 20 mL

## 2023-08-09 NOTE — Discharge Instructions (Addendum)
Use topical bacitracin or Neosporin applied to your nares/nostrils twice daily.  Use a humidifier in your bedroom.  Do not insert anything into your nose.  Resume your eliquis tonight. Follow-up with your PCP  It was a pleasure caring for you today in the emergency department.  Please return to the emergency department for any worsening or worrisome symptoms.

## 2023-08-09 NOTE — ED Provider Notes (Signed)
[place] EMERGENCY DEPARTMENT AT [facility] Provider Note  CSN: [id number] Arrival date & time: 08/09/23 1039  Chief Complaint(s) Epistaxis  HPI Martin [name] is a 62 y.o. 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  Nosebleed since yesterday afternoon, some blood going to the back of his throat, example nausea, no vomiting.  Has been sticking toilet tissue and applying direct pressure to his nose without relief.  Bleeding has somewhat slowed down since the onset.  No lightheadedness, no syncope, no dyspnea.  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    Anxiety    Arthritis    DDD lumbar, arthritis knees   Complication of anesthesia    pt states he woke up during a vascular procedure   COPD (chronic obstructive pulmonary disease) (HCC)    DJD (degenerative joint disease) of lumbar spine    GERD (gastroesophageal reflux disease)    Hemorrhoid    internal   History of bowel resection 04/19/2017   Hyperlipidemia    Hypertension    Pt states he's never been told or treated for HTN   Illicit drug use    + UDS cocaine, THC, opiates 10/11/21   Inguinal hernia    MSSA bacteremia 10/11/2021   s/p IV cefazolin, po Linezolid; 10/16/21 TEE w/o vegetations.   Peripheral vascular disease (HCC)    Pneumonia    as child, cough at present with no fever   Pneumonia    as child, cough at present with no fever    Radiation 11/19/11-01/08/12   5040 cGy 28 fx Pelvis and inguinal area   Stroke [facility])    no weaknes or paralysis   Substance abuse (HCC)    last use was Jan. 2024; patient states he has "been clean" since then   Patient Active Problem List   Diagnosis Date Noted    Carpal tunnel syndrome on right 02/03/2023   Right carpal tunnel syndrome 10/28/2022   Protein-calorie malnutrition, severe 04/29/2022   Cellulitis of right lower extremity 04/28/2022   Sepsis 10/12/2021   Peripheral vascular disease    AKI    Tobacco use    COPD (chronic obstructive pulmonary disease) (HCC)    BPH     Periodontal disease    IVDU, cocaine use    Demand ischemia    Thrombocytopenia    Normocytic anemia    MSSA bacteremia 10/11/2021   Callus 06/12/2021   Hav (hallux abducto valgus), unspecified laterality 06/12/2021   Hammer toes, bilateral 06/12/2021   Atherosclerosis of native arteries of extremity with intermittent claudication (HCC) 06/12/2021   Hyperlipidemia 06/07/2021   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   Elevated blood-pressure reading, without diagnosis of hypertension 09/26/2020   Body mass index (BMI) 29.0-29.9, adult 08/29/2020   Partial small bowel obstruction (HCC) 03/10/2020   Small bowel obstruction due to adhesions (HCC) 03/08/2020   Enteritis 10/15/2019   Abnormal liver function 10/15/2019   Leukocytosis 10/15/2019   Nausea & vomiting 10/15/2019   GAD (generalized anxiety disorder) 05/18/2017   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   Constipation 04/11/2017   Bee sting 04/05/2017   Diverticulosis 11/29/2014   Healthcare maintenance 11/06/2014   Cough 10/05/2014   Insomnia  09/13/2014   Erectile dysfunction 11/30/2013   Chronic pain syndrome 11/30/2013   Depression 11/30/2013   GERD (gastroesophageal reflux disease)    Hypertension    Anxiety    Arthritis    Osteoarthritis of lumbar spine    Radiation    Night sweats 08/12/2012   Tobacco abuse counseling 05/26/2012   Lymphocytic colitis 05/03/2012   Vitamin B12 deficiency 03/19/2012   Folate deficiency 03/19/2012   Inguinal hernia 11/24/2011   Cancer (HCC) 10/14/2011   Internal hemorrhoid 09/11/2011   Anal cancer 10/09/2010   Home Medication(s) Prior to Admission  medications   Medication Sig Start Date End Date Taking? Authorizing Provider  albuterol (VENTOLIN HFA) 108 (90 Base) MCG/ACT inhaler Inhale 2 puffs into the lungs every 6 (six) hours as needed for wheezing or shortness of breath. 05/11/23  Yes Valentino Nose, NP  apixaban Everlene Balls) 2.5 MG TABS tablet Take 1 tablet by mouth twice daily 06/29/23  Yes Georgiana Spinner, NP  atorvastatin (LIPITOR) 10 MG tablet Take 1 tablet (10 mg total) by mouth daily. Additional refills per primary provider 05/18/23 05/17/24 Yes Georgiana Spinner, NP  clopidogrel (PLAVIX) 75 MG tablet Take 1 tablet (75 mg total) by mouth daily. 10/07/22  Yes Georgiana Spinner, NP  EPINEPHrine (EPIPEN 2-PAK) 0.3 mg/0.3 mL IJ SOAJ injection Inject 0.3 mg (1 pen) into the muscle as needed for anaphylaxis. 12/30/22  Yes Zonia Kief, Amy J, NP  furosemide (LASIX) 20 MG tablet Take 20 mg by mouth daily. 07/02/23  Yes [provider]  METHADONE HCL PO Take 47 mg by mouth daily.   Yes [provider]  Multiple Vitamin (MULTIVITAMIN ADULT PO) Take by mouth daily.   Yes [provider]  ondansetron (ZOFRAN) 4 MG tablet Take 1 tablet (4 mg total) by mouth every 4 (four) hours as needed for nausea or vomiting. 08/09/23  Yes Tanda Rockers A, DO  SPIRIVA RESPIMAT 2.5 MCG/ACT AERS Inhale 2 puffs into the lungs daily. 06/02/23  Yes [provider]  spironolactone (ALDACTONE) 25 MG tablet Take 25 mg by mouth daily. 06/01/23  Yes [provider]  tamsulosin (FLOMAX) 0.4 MG CAPS capsule Take 1 capsule (0.4 mg total) by mouth at bedtime. 09/16/22  Yes Georganna Skeans, MD  traZODone (DESYREL) 50 MG tablet Take 50 mg by mouth at bedtime. 08/04/23  Yes [provider]  sucralfate (CARAFATE) 1 GM/10ML suspension Take 10 mLs (1 g total) by mouth 4 (four) times daily -  with meals and at bedtime. Patient not taking: Reported on 11/28/2019 10/21/19 06/13/20  Cy Blamer, MD                                                                                                                                     Past Surgical History Past Surgical History:  Procedure Laterality Date   APPENDECTOMY     age 38 or 29   CARPAL TUNNEL  RELEASE Right 02/04/2023   Procedure: RIGHT CARPAL TUNNEL RELEASE;  Surgeon: Tarry Kos, MD;  Location: [facility];  Service: Orthopedics;  Laterality: Right;   EXAMINATION UNDER ANESTHESIA  10/14/2011   Procedure: EXAM UNDER ANESTHESIA;  Surgeon: Velora Heckler, MD;  Location: [facility];  Service: General;  Laterality: N/A;  exam under anethesia, Excision of mass anal canal, 1.5cm   INGUINAL HERNIA REPAIR  05/11/2012   Procedure: HERNIA REPAIR INGUINAL ADULT;  Surgeon: Velora Heckler, MD;  Location: [facility];  Service: General;  Laterality: Left;  left inguinal hernia repair with mesh   LOWER EXTREMITY ANGIOGRAPHY Left 07/02/2021   Procedure: LOWER EXTREMITY ANGIOGRAPHY;  Surgeon: Renford Dills, MD;  Location: [facility];  Service: Cardiovascular;  Laterality: Left;   LOWER EXTREMITY ANGIOGRAPHY Right 07/16/2021   Procedure: LOWER EXTREMITY ANGIOGRAPHY;  Surgeon: Renford Dills, MD;  Location: [facility];  Service: Cardiovascular;  Laterality: Right;   LOWER EXTREMITY ANGIOGRAPHY Left 08/27/2021   Procedure: LOWER EXTREMITY ANGIOGRAPHY;  Surgeon: Renford Dills, MD;  Location: [facility];  Service: Cardiovascular;  Laterality: Left;   LOWER EXTREMITY ANGIOGRAPHY Left 01/14/2022   Procedure: Lower Extremity Angiography;  Surgeon: Renford Dills, MD;  Location: [facility];  Service: Cardiovascular;  Laterality: Left;   LOWER EXTREMITY ANGIOGRAPHY Left 11/04/2022   Procedure: Lower Extremity Angiography;  Surgeon: Renford Dills, MD;  Location: [facility];  Service: Cardiovascular;  Laterality: Left;   SMALL INTESTINE SURGERY  04/19/2017   exploratory lap, partial small bowel resection for perforated  ulcer/abcess, radiation enteritis   surgical pathology   10/14/2011   squamous cell ca of anus   TEE WITHOUT CARDIOVERSION N/A 10/16/2021   Procedure: TRANSESOPHAGEAL ECHOCARDIOGRAM (TEE);  Surgeon: Chrystie Nose, MD;  Location: [facility];  Service: Cardiovascular;  Laterality: N/A;   TOOTH EXTRACTION N/A 12/04/2021   Procedure: DENTAL RESTORATION/EXTRACTIONS;  Surgeon: Ocie Doyne, DMD;  Location: [facility];  Service: Oral Surgery;  Laterality: N/A;   transanal excision  10/14/2011   Dr.Todd Gerkin   Family History Family History  Problem Relation Age of Onset   Colon cancer Father    Asthma Father    Cancer Father        prostate   Hypertension Father    Asthma Mother    Hypertension Mother    Stomach cancer Neg Hx    Esophageal cancer Neg Hx     Social History Social History   Tobacco Use   Smoking status: Every Day    Current packs/day: 1.00    Average packs/day: 1 pack/day for 47.0 years (47.0 ttl pk-yrs)    Types: Cigarettes   Smokeless tobacco: Never   Tobacco comments:    Cutting back>> Quit Smart card supplied  Vaping Use   Vaping status: Never Used  Substance Use Topics   Alcohol use: Not Currently    Comment: occasional   Drug use: Yes    Types: Marijuana, "Crack" cocaine    Comment: hx cocaine use- states stopped 6 months ago but last positive drug screen 09/2022   Allergies Aspirin, Yellow jacket venom [bee venom], Codeine, Ibuprofen, and Morphine and codeine  Review of Systems Review of Systems  Constitutional:  Negative for chills and fever.  HENT:  Positive for nosebleeds.   Respiratory:  Negative for chest tightness and shortness of breath.   Gastrointestinal:  Positive for nausea. Negative for abdominal pain and vomiting.  Genitourinary:  Negative for difficulty urinating.  Neurological:  Negative for syncope.  All other systems reviewed and are negative.   Physical Exam Vital Signs  I have reviewed the triage vital signs BP (!) 177/46  Comment: Dr. Wallace Cullens aware of BP. OK to discharge. Patient reports having not taking his BP medications today.  Pulse 72   Temp 98.1 F (36.7 C) (Oral)   Resp 16   Ht 5\' 11"  (1.803 m)   Wt 65.8 kg   SpO2 99%   BMI 20.22 kg/m  Physical Exam Vitals and nursing note reviewed.  Constitutional:      General: He is not in acute distress.    Appearance: Normal appearance. He is well-developed. He is not ill-appearing.  HENT:     Head: Normocephalic and atraumatic.     Comments: Epistaxis right nare  foreign body right nare  Blood noted posterior oropharynx      Right Ear: External ear normal.     Left Ear: External ear normal.     Nose: Nose normal.     Mouth/Throat:     Mouth: Mucous membranes are moist.  Eyes:     General: No scleral icterus.       Right eye: No discharge.        Left eye: No discharge.  Cardiovascular:     Rate and Rhythm: Normal rate.  Pulmonary:     Effort: Pulmonary effort is normal. No respiratory distress.     Breath sounds: No stridor.  Abdominal:     General: Abdomen is flat. There is no distension.     Tenderness: There is no guarding.  Musculoskeletal:        General: No deformity.     Cervical back: No rigidity.  Skin:    General: Skin is warm and dry.     Coloration: Skin is not cyanotic, jaundiced or pale.  Neurological:     Mental Status: He is alert and oriented to person, place, and time.     GCS: GCS eye subscore is 4. GCS verbal subscore is 5. GCS motor subscore is 6.  Psychiatric:        Speech: Speech normal.        Behavior: Behavior normal. Behavior is cooperative.     ED Results and Treatments Labs (all labs ordered are listed, but only abnormal results are displayed) Labs Reviewed - No data to display                                                                                                                        Radiology No results found.  Pertinent labs & imaging results that were available during my care of the  patient were reviewed by me and considered in my medical decision making (see MDM for details).  Medications Ordered in ED Medications  oxymetazoline (AFRIN) 0.05 % nasal spray 1 spray (1 spray Each Nare Given 08/09/23 1205)  tranexamic acid (CYKLOKAPRON) 1000 MG/10ML topical solution 500  mg (500 mg Topical Given by Other 08/09/23 1206)  lidocaine-EPINEPHrine (XYLOCAINE W/EPI) 2 %-1:100000 (with pres) injection 20 mL (20 mLs Other Given by Other 08/09/23 1202)  ondansetron (ZOFRAN-ODT) disintegrating tablet 4 mg (4 mg Oral Given 08/09/23 1244)                                                                                                                                     Procedures .Foreign Body Removal  Date/Time: 08/09/2023 11:48 AM  Performed by: Sloan Leiter, DO Authorized by: Sloan Leiter, DO  Consent: Verbal consent obtained. Risks and benefits: risks, benefits and alternatives were discussed Consent given by: patient Patient understanding: patient states understanding of the procedure being performed Imaging studies: imaging studies available Required items: required blood products, implants, devices, and special equipment available Patient identity confirmed: verbally with patient Time out: Immediately prior to procedure a "time out" was called to verify the correct patient, procedure, equipment, support staff and site/side marked as required. Body area: nose Location details: right nostril  Sedation: Patient sedated: no  Patient restrained: no Patient cooperative: yes Localization method: visualized Removal mechanism: forceps Complexity: simple 1 objects recovered. Objects recovered: apparent tissue paper Post-procedure assessment: residual foreign bodies remain Patient tolerance: patient tolerated the procedure well with no immediate complications  Epistaxis Management  Date/Time: 08/10/2023 4:26 PM  Performed by: Sloan Leiter, DO Authorized by: Sloan Leiter, DO   Consent:    Consent obtained:  Verbal   Consent given by:  Patient   Risks, benefits, and alternatives were discussed: yes     Risks discussed:  Bleeding and infection   Alternatives discussed:  No treatment and delayed treatment Universal protocol:    Procedure explained and questions answered to patient or proxy's satisfaction: yes     Immediately prior to procedure, a time out was called: yes     Patient identity confirmed:  Verbally with patient and arm band Anesthesia:    Anesthesia method:  Topical application   Topical anesthetic:  Epinephrine Procedure details:    Treatment site:  R anterior   Treatment method:  Anterior pack   Treatment complexity:  Limited   Treatment episode: initial   Post-procedure details:    Assessment:  Bleeding stopped   Procedure completion:  Tolerated well, no immediate complications Comments:     Lidocaine w/ epi, txa, afrin   (including critical care time)  Medical Decision Making / ED Course    Medical Decision Making:    RIELEY JAECKS is a 62 y.o. 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 The complaint involves an extensive differential diagnosis and also carries with it a high risk of complications and morbidity.  Serious etiology was considered. Ddx includes but is not limited to: Anterior nosebleed, posterior nosebleed,  AVM, etc.  Complete initial physical exam performed, notably the patient was in no acute distress, sitting upright in chair, ambulatory.    Reviewed and confirmed nursing documentation for past medical history, family history, social history.  Vital signs reviewed.    Patient with right-sided epistaxis, some blood in posterior oropharynx, no syncope.  Nauseated likely secondary to swallowing blood.  Give Zofran, will remove foreign body from nose, apply epistatic agents  and pressure, recheck  Clinical Course as of 08/10/23 1627  Sun Aug 09, 2023  1326 Epistaxis seems to have resolved [SG]    Clinical Course User Index [SG] Sloan Leiter, DO    Brief summary: 62 yo male on DOAC here with epistaxis, some nausea/spitting up blood occ. FB removed  / tissue paper apparent. Epistaxis improved w/ treatment here. Nose bleed precautions for home advised. He is having some nausea likely 2/2 epistaxis/swallowing some blood, no emesis in the ED. Give some zofran for home, should resolve, f/u with pcp if continues   The patient improved significantly and was discharged in stable condition. Detailed discussions were had with the patient regarding current findings, and need for close f/u with PCP or on call doctor. The patient has been instructed to return immediately if the symptoms worsen in any way for re-evaluation. Patient verbalized understanding and is in agreement with current care plan. All questions answered prior to discharge.                  Additional history obtained: -Additional history obtained from family -External records from outside source obtained and reviewed including: Chart review including previous notes, labs, imaging, consultation notes including  Home meds   Lab Tests: na  EKG   EKG Interpretation Date/Time:    Ventricular Rate:    PR Interval:    QRS Duration:    QT Interval:    QTC Calculation:   R Axis:      Text Interpretation:           Imaging Studies ordered: na   Medicines ordered and prescription drug management: Meds ordered this encounter  Medications   oxymetazoline (AFRIN) 0.05 % nasal spray 1 spray   tranexamic acid (CYKLOKAPRON) 1000 MG/10ML topical solution 500 mg   lidocaine-EPINEPHrine (XYLOCAINE W/EPI) 2 %-1:100000 (with pres) injection 20 mL   DISCONTD: tranexamic acid (CYKLOKAPRON) 1000MG /116mL IVPB    [place], Danielle: cabinet override   DISCONTD: lidocaine-EPINEPHrine  (XYLOCAINE W/EPI) 2 %-1:200000 (PF) injection    [place], Danielle: cabinet override   ondansetron (ZOFRAN-ODT) disintegrating tablet 4 mg   ondansetron (ZOFRAN) 4 MG tablet    Sig: Take 1 tablet (4 mg total) by mouth every 4 (four) hours as needed for nausea or vomiting.    Dispense:  6 tablet    Refill:  0    -I have reviewed the patients home medicines and have made adjustments as needed   Consultations Obtained: na   Cardiac Monitoring: Continuous pulse oximetry interpreted by myself, 99% on RA.    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    Anxiety    Arthritis    DDD lumbar, arthritis  knees   Complication of anesthesia    pt states he woke up during a vascular procedure   COPD (chronic obstructive pulmonary disease) (HCC)    DJD (degenerative joint disease) of lumbar spine    GERD (gastroesophageal reflux disease)    Hemorrhoid    internal   History of bowel resection 04/19/2017   Hyperlipidemia    Hypertension    Pt states he's never been told or treated for HTN   Illicit drug use    + UDS cocaine, THC, opiates 10/11/21   Inguinal hernia    MSSA bacteremia 10/11/2021   s/p IV cefazolin, po Linezolid; 10/16/21 TEE w/o vegetations.   Peripheral vascular disease (HCC)    Pneumonia    as child, cough at present with no fever   Pneumonia    as child, cough at present with no fever    Radiation 11/19/11-01/08/12   5040 cGy 28 fx Pelvis and inguinal area   Stroke [facility])    no weaknes or paralysis   Substance abuse [facility])    last use was Jan. 2024; patient states he has "been clean"  since then      Dispostion: Disposition decision including need for hospitalization was considered, and patient discharged from emergency department.    Final Clinical Impression(s) / ED Diagnoses Final diagnoses:  Epistaxis  Anticoagulated        Sloan Leiter, DO 08/10/23 1628

## 2023-08-09 NOTE — ED Triage Notes (Signed)
Patient reports nose bleed since yesterday morning. Patient is on eliquis and plavix. Patient reports nausea and dizziness.

## 2023-08-18 ENCOUNTER — Emergency Department (HOSPITAL_COMMUNITY)
Admission: EM | Admit: 2023-08-18 | Discharge: 2023-08-18 | Discharge status: Against Medical Advice | Payer: Medicare HMO

## 2023-08-18 NOTE — ED Notes (Signed)
Registration called Pt for their location and Pt stated that he went home.

## 2023-08-19 ENCOUNTER — Encounter (HOSPITAL_COMMUNITY): Payer: Self-pay

## 2023-08-19 ENCOUNTER — Emergency Department (HOSPITAL_COMMUNITY)
Admission: EM | Admit: 2023-08-19 | Discharge: 2023-08-19 | Disposition: A | Discharge status: Home / Self Care | Payer: Medicare HMO

## 2023-08-19 ENCOUNTER — Telehealth (INDEPENDENT_AMBULATORY_CARE_PROVIDER_SITE_OTHER): Payer: Self-pay

## 2023-08-19 ENCOUNTER — Other Ambulatory Visit: Payer: Self-pay

## 2023-08-19 DIAGNOSIS — R040 Epistaxis: Secondary | ICD-10-CM | POA: Diagnosis present

## 2023-08-19 DIAGNOSIS — Z7901 Long term (current) use of anticoagulants: Secondary | ICD-10-CM | POA: Insufficient documentation

## 2023-08-19 MED ORDER — OXYMETAZOLINE HCL 0.05 % NA SOLN: 1.0000 | Freq: Two times a day (BID) | NASAL | 0 refills | Status: DC

## 2023-08-19 MED ORDER — ONDANSETRON 4 MG PO TBDP
4.0000 mg | ORAL_TABLET | Freq: Once | ORAL | Status: AC
  Administered 2023-08-19: 4 mg via ORAL
  Filled 2023-08-19: qty 1

## 2023-08-19 MED ORDER — OXYMETAZOLINE HCL 0.05 % NA SOLN
2.0000 | Freq: Once | NASAL | Status: AC
  Administered 2023-08-19: 2 via NASAL
  Filled 2023-08-19: qty 30

## 2023-08-19 NOTE — ED Provider Notes (Signed)
[facility] EMERGENCY DEPARTMENT AT [facility] Provider Note   CSN: [id number] Arrival date & time: 08/19/23  0645     History  Chief Complaint  Patient presents with   Epistaxis    Martin [name] is a 62 y.o. male.  This is a 62 year old male presenting emergency department for nosebleed.  Reports he had nosebleed on Monday, was seen here.  Bleeding stopped.  Reports some intermittent bleeding since then.  Awoke this morning with worsening bleed.  He is on Eliquis.   Epistaxis      Home Medications Prior to Admission medications   Medication Sig Start Date End Date Taking? Authorizing Provider  oxymetazoline (AFRIN) 0.05 % nasal spray Place 1 spray into both nostrils 2 (two) times daily. 08/19/23  Yes Caydance Kuehnle, Harmon Dun, DO  albuterol (VENTOLIN HFA) 108 (90 Base) MCG/ACT inhaler Inhale 2 puffs into the lungs every 6 (six) hours as needed for wheezing or shortness of breath. 05/11/23   Valentino Nose, NP  apixaban Everlene Balls) 2.5 MG TABS tablet Take 1 tablet by mouth twice daily 06/29/23   Georgiana Spinner, NP  atorvastatin (LIPITOR) 10 MG tablet Take 1 tablet (10 mg total) by mouth daily. Additional refills per primary provider 05/18/23 05/17/24  Georgiana Spinner, NP  clopidogrel (PLAVIX) 75 MG tablet Take 1 tablet (75 mg total) by mouth daily. 10/07/22   Georgiana Spinner, NP  EPINEPHrine (EPIPEN 2-PAK) 0.3 mg/0.3 mL IJ SOAJ injection Inject 0.3 mg (1 pen) into the muscle as needed for anaphylaxis. 12/30/22   Rema Fendt, NP  furosemide (LASIX) 20 MG tablet Take 20 mg by mouth daily. 07/02/23   [provider]  METHADONE HCL PO Take 47 mg by mouth daily.    [provider]  Multiple Vitamin (MULTIVITAMIN ADULT PO) Take by mouth daily.    [provider]  ondansetron (ZOFRAN) 4 MG tablet Take 1 tablet (4 mg total) by mouth every 4 (four) hours as needed for nausea or vomiting. 08/09/23   Tanda Rockers A, DO  SPIRIVA RESPIMAT 2.5 MCG/ACT AERS Inhale  2 puffs into the lungs daily. 06/02/23   [provider]  spironolactone (ALDACTONE) 25 MG tablet Take 25 mg by mouth daily. 06/01/23   [provider]  tamsulosin (FLOMAX) 0.4 MG CAPS capsule Take 1 capsule (0.4 mg total) by mouth at bedtime. 09/16/22   Georganna Skeans, MD  traZODone (DESYREL) 50 MG tablet Take 50 mg by mouth at bedtime. 08/04/23   [provider]  sucralfate (CARAFATE) 1 GM/10ML suspension Take 10 mLs (1 g total) by mouth 4 (four) times daily -  with meals and at bedtime. Patient not taking: Reported on 11/28/2019 10/21/19 06/13/20  Palumbo, April, MD      Allergies    Aspirin, Yellow jacket venom [bee venom], Codeine, Ibuprofen, and Morphine and codeine    Review of Systems   Review of Systems  HENT:  Positive for nosebleeds.     Physical Exam Updated Vital Signs BP (!) 157/36 (BP Location: Right Arm)   Pulse 71   Temp 97.7 F (36.5 C) (Oral)   Resp 18   SpO2 100%  Physical Exam Vitals and nursing note reviewed.  Constitutional:      General: He is not in acute distress.    Appearance: He is not toxic-appearing.  HENT:     Head: Normocephalic.     Nose: Nose normal.     Comments: Some clots in the right nare.  No active bleeding.  Patient blew nose, had some friable tissue in the anterior Kiesselbach plexus, but no active bleeding.    Mouth/Throat:     Mouth: Mucous membranes are moist.     Comments: No blood down the posterior pharynx Eyes:     Conjunctiva/sclera: Conjunctivae normal.  Cardiovascular:     Rate and Rhythm: Normal rate.     Pulses: Normal pulses.  Pulmonary:     Effort: Pulmonary effort is normal.  Skin:    General: Skin is warm.     Capillary Refill: Capillary refill takes less than 2 seconds.  Neurological:     Mental Status: He is alert and oriented to person, place, and time.  Psychiatric:        Mood and Affect: Mood normal.        Behavior: Behavior normal.     ED Results / Procedures / Treatments    Labs (all labs ordered are listed, but only abnormal results are displayed) Labs Reviewed - No data to display  EKG None  Radiology No results found.  Procedures Procedures    Medications Ordered in ED Medications  oxymetazoline (AFRIN) 0.05 % nasal spray 2 spray (2 sprays Each Nare Given 08/19/23 0744)  ondansetron (ZOFRAN-ODT) disintegrating tablet 4 mg (4 mg Oral Given 08/19/23 0827)    ED Course/ Medical Decision Making/ A&P Clinical Course as of 08/19/23 1544  Wed Aug 19, 2023  0732 No active bleeding currently.  [TY]  0821 BP(!): 186/37 [TY]    Clinical Course User Index [TY] Coral Spikes, DO                                 Medical Decision Making Is a 62 year old male presenting emergency department for nosebleed.  Per chart review patient is on Eliquis.  He is afebrile nontachycardic, hypertensive.  Exam with  initial minor oozing blood likely secondary to clot that was in nature.  Blew nose, held pressure and gave Afrin.  No further bleeding after a brief observation period in the emergency department.  Discussed subsequent care at home if rebleeding occur.  Also given referral to ear nose throat.  Will discharge with Afrin.  Patient did note that he feels slightly nauseated from swallowing blood.  Will give Zofran here.  Stable for discharge at this time.  Risk OTC drugs. Prescription drug management.         Final Clinical Impression(s) / ED Diagnoses Final diagnoses:  Epistaxis    Rx / DC Orders ED Discharge Orders          Ordered    oxymetazoline (AFRIN) 0.05 % nasal spray  2 times daily        08/19/23 4098              Coral Spikes, DO 08/19/23 1544

## 2023-08-19 NOTE — ED Triage Notes (Signed)
Pt states that he has had nosebleed from right nare x 3 days. Pt takes Plavix and Eliquis.

## 2023-08-19 NOTE — Telephone Encounter (Signed)
Patient left a message stating that was seen in the hospital and been having nose bleeds for the past three days. Patient is taking Clopidogrel and Eliquis. Patient stated that he haven't taking no blood thinners for the past three days. Please Advise

## 2023-08-19 NOTE — Telephone Encounter (Signed)
Left a detailed message on patient voicemail

## 2023-08-19 NOTE — Telephone Encounter (Signed)
Tell him to restart his plavix but to hold the eliquis

## 2023-08-24 ENCOUNTER — Telehealth (INDEPENDENT_AMBULATORY_CARE_PROVIDER_SITE_OTHER): Payer: Self-pay

## 2023-08-24 NOTE — Telephone Encounter (Signed)
He can continue to hold plavix and eliquis until his ENT visit and we can change recommendations based on their interventions

## 2023-08-24 NOTE — Telephone Encounter (Signed)
Message given. He said its been a few days since he took the Eliquis, but had still been taking the Plavix. He said he will switch and do the opposite

## 2023-08-24 NOTE — Telephone Encounter (Signed)
Martin [name] just left his ENT appt. He stated there is a little but of blood and he cut the Palvix but not the Eliquis.  Please advise.

## 2023-08-24 NOTE — Telephone Encounter (Signed)
Patient left a message stating that he is still having nose bleeds. He has been evaluated several times but the bleeding still continues. Patient stated that he did take Plavix for few days. Patient has appointment today with ENT and he will reach out to the office after the visit. Please advise on vascular standpoint.

## 2023-08-24 NOTE — Telephone Encounter (Signed)
We are ok if if he continues with eliquis and holds plavix.  We can see how he is doing at his visit in a few weeks

## 2023-08-24 NOTE — Telephone Encounter (Signed)
Added message to other note pertaining to the phone call.

## 2023-09-01 ENCOUNTER — Other Ambulatory Visit (INDEPENDENT_AMBULATORY_CARE_PROVIDER_SITE_OTHER): Payer: Self-pay | Admitting: Nurse Practitioner

## 2023-09-01 DIAGNOSIS — Z9889 Other specified postprocedural states: Secondary | ICD-10-CM

## 2023-09-07 ENCOUNTER — Encounter (INDEPENDENT_AMBULATORY_CARE_PROVIDER_SITE_OTHER): Payer: Self-pay | Admitting: Vascular Surgery

## 2023-09-07 ENCOUNTER — Ambulatory Visit (INDEPENDENT_AMBULATORY_CARE_PROVIDER_SITE_OTHER): Payer: Medicare HMO | Admitting: Vascular Surgery

## 2023-09-07 ENCOUNTER — Ambulatory Visit (INDEPENDENT_AMBULATORY_CARE_PROVIDER_SITE_OTHER): Payer: Medicare HMO

## 2023-09-07 ENCOUNTER — Other Ambulatory Visit (INDEPENDENT_AMBULATORY_CARE_PROVIDER_SITE_OTHER): Payer: Self-pay | Admitting: Nurse Practitioner

## 2023-09-07 VITALS — BP 146/44 | HR 64 | Resp 18 | Ht 71.0 in | Wt 139.8 lb

## 2023-09-07 DIAGNOSIS — Z9889 Other specified postprocedural states: Secondary | ICD-10-CM | POA: Diagnosis not present

## 2023-09-07 DIAGNOSIS — I739 Peripheral vascular disease, unspecified: Secondary | ICD-10-CM | POA: Diagnosis not present

## 2023-09-07 DIAGNOSIS — J439 Emphysema, unspecified: Secondary | ICD-10-CM

## 2023-09-07 DIAGNOSIS — E785 Hyperlipidemia, unspecified: Secondary | ICD-10-CM

## 2023-09-07 DIAGNOSIS — I10 Essential (primary) hypertension: Secondary | ICD-10-CM | POA: Diagnosis not present

## 2023-09-07 DIAGNOSIS — I70213 Atherosclerosis of native arteries of extremities with intermittent claudication, bilateral legs: Secondary | ICD-10-CM | POA: Diagnosis not present

## 2023-09-07 DIAGNOSIS — M47816 Spondylosis without myelopathy or radiculopathy, lumbar region: Secondary | ICD-10-CM | POA: Diagnosis not present

## 2023-09-08 LAB — VAS US ABI WITH/WO TBI
Left ABI: 1.01
Right ABI: 1.21

## 2023-09-10 ENCOUNTER — Encounter (INDEPENDENT_AMBULATORY_CARE_PROVIDER_SITE_OTHER): Payer: Self-pay | Admitting: Vascular Surgery

## 2023-09-21 ENCOUNTER — Encounter: Payer: Medicare HMO | Admitting: Podiatry

## 2023-09-21 NOTE — Progress Notes (Signed)
 Patient did not show for his scheduled appointment this morning

## 2023-10-07 NOTE — Progress Notes (Deleted)
 MRN : [id number]  Martin [name] is a 63 y.o. ([birth year]) male who presents with chief complaint of check circulation.  History of Present Illness:   The patient returns to the office for followup and review status post angiogram with intervention on 11/04/2022.    Procedure:  Percutaneous transluminal angioplasty and stent placement left popliteal artery 2.    Percutaneous transluminal angioplasty left profunda femoris 3.    Percutaneous transluminal angioplasty left superficial femoral artery within the previously placed stent. 4.    Percutaneous transluminal angioplasty left tibioperoneal trunk and peroneal to 4 mm   The patient notes stability in the lower extremity symptoms. No interval shortening of the patient's claudication distance or rest pain symptoms. No new ulcers or wounds have occurred since the last visit.   He has numerous other complaints.  He has been struggling with a nosebleed and anemia secondary to blood loss because of this as well as a variety of cardiology issues.  He does not feel that people are listening to him and this is complicating his care.     No documented history of amaurosis fugax or recent TIA symptoms. There are no recent neurological changes noted. No documented history of DVT, PE or superficial thrombophlebitis. The patient denies recent episodes of angina or shortness of breath.    ABI's Rt=1.21 and Lt=1.01  (previous ABI's Rt=1.08 and Lt=1.12) Duplex US of the left arterial system demonstrates patency of the SFA stents with biphasic signals in the tibial vessels.  No outpatient medications have been marked as taking for the 10/08/23 encounter (Appointment) with Gilda Crease, Latina Craver, MD.    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    Anxiety    Arthritis    DDD lumbar, arthritis  knees   Complication of anesthesia    pt states he woke up during a vascular procedure   COPD (chronic obstructive pulmonary disease) (HCC)    DJD (degenerative joint disease) of lumbar spine    GERD (gastroesophageal reflux disease)    Hemorrhoid    internal   History of bowel resection 04/19/2017   Hyperlipidemia    Hypertension    Pt states he's never been told or treated for HTN   Illicit drug use    + UDS cocaine, THC, opiates 10/11/21   Inguinal hernia    MSSA bacteremia 10/11/2021   s/p IV cefazolin, po Linezolid; 10/16/21 TEE w/o vegetations.   Peripheral vascular disease (HCC)    Pneumonia    as child, cough at present with no fever   Pneumonia    as child, cough at present with no fever    Radiation 11/19/11-01/08/12   5040 cGy 28 fx Pelvis and inguinal area   Stroke [facility])    no weaknes or paralysis   Substance abuse [facility])    last use was Jan. 2024; patient states he has "been clean" since then    Past Surgical History:  Procedure Laterality Date   APPENDECTOMY  age 2 or 29   CARPAL TUNNEL RELEASE Right 02/04/2023   Procedure: RIGHT CARPAL TUNNEL RELEASE;  Surgeon: Tarry Kos, MD;  Location: [facility];  Service: Orthopedics;  Laterality: Right;   EXAMINATION UNDER ANESTHESIA  10/14/2011   Procedure: EXAM UNDER ANESTHESIA;  Surgeon: Velora Heckler, MD;  Location: [facility];  Service: General;  Laterality: N/A;  exam under anethesia, Excision of mass anal canal, 1.5cm   INGUINAL HERNIA REPAIR  05/11/2012   Procedure: HERNIA REPAIR INGUINAL ADULT;  Surgeon: Velora Heckler, MD;  Location: [facility];  Service: General;  Laterality: Left;  left inguinal hernia repair with mesh   LOWER EXTREMITY ANGIOGRAPHY Left 07/02/2021   Procedure: LOWER EXTREMITY ANGIOGRAPHY;  Surgeon: Renford Dills, MD;  Location: [facility];  Service: Cardiovascular;  Laterality: Left;   LOWER EXTREMITY ANGIOGRAPHY Right 07/16/2021   Procedure: LOWER  EXTREMITY ANGIOGRAPHY;  Surgeon: Renford Dills, MD;  Location: [facility];  Service: Cardiovascular;  Laterality: Right;   LOWER EXTREMITY ANGIOGRAPHY Left 08/27/2021   Procedure: LOWER EXTREMITY ANGIOGRAPHY;  Surgeon: Renford Dills, MD;  Location: [facility];  Service: Cardiovascular;  Laterality: Left;   LOWER EXTREMITY ANGIOGRAPHY Left 01/14/2022   Procedure: Lower Extremity Angiography;  Surgeon: Renford Dills, MD;  Location: [facility];  Service: Cardiovascular;  Laterality: Left;   LOWER EXTREMITY ANGIOGRAPHY Left 11/04/2022   Procedure: Lower Extremity Angiography;  Surgeon: Renford Dills, MD;  Location: [facility];  Service: Cardiovascular;  Laterality: Left;   SMALL INTESTINE SURGERY  04/19/2017   exploratory lap, partial small bowel resection for perforated ulcer/abcess, radiation enteritis   surgical pathology   10/14/2011   squamous cell ca of anus   TEE WITHOUT CARDIOVERSION N/A 10/16/2021   Procedure: TRANSESOPHAGEAL ECHOCARDIOGRAM (TEE);  Surgeon: Chrystie Nose, MD;  Location: [facility];  Service: Cardiovascular;  Laterality: N/A;   TOOTH EXTRACTION N/A 12/04/2021   Procedure: DENTAL RESTORATION/EXTRACTIONS;  Surgeon: Ocie Doyne, DMD;  Location: [facility];  Service: Oral Surgery;  Laterality: N/A;   transanal excision  10/14/2011   Dr.Todd Gerkin    Social History Social History   Tobacco Use   Smoking status: Every Day    Current packs/day: 1.00    Average packs/day: 1 pack/day for 47.0 years (47.0 ttl pk-yrs)    Types: Cigarettes   Smokeless tobacco: Never   Tobacco comments:    Cutting back>> Quit Smart card supplied  Vaping Use   Vaping status: Never Used  Substance Use Topics   Alcohol use: Not Currently    Comment: occasional   Drug use: Not Currently    Types: Marijuana, "Crack" cocaine    Comment: hx cocaine use- states stopped 6 months ago but last positive drug screen 09/2022    Family  History Family History  Problem Relation Age of Onset   Colon cancer Father    Asthma Father    Cancer Father        prostate   Hypertension Father    Asthma Mother    Hypertension Mother    Stomach cancer Neg Hx    Esophageal cancer Neg Hx     Allergies  Allergen Reactions   Aspirin Swelling    Lip swelling   Yellow Jacket Venom [Bee Venom] Anaphylaxis   Codeine Nausea Only   Ibuprofen     On blood thinners   Morphine And Codeine Itching     REVIEW  OF SYSTEMS (Negative unless checked)  Constitutional: [] Weight loss  [] Fever  [] Chills Cardiac: [] Chest pain   [] Chest pressure   [] Palpitations   [] Shortness of breath when laying flat   [] Shortness of breath with exertion. Vascular:  [x] Pain in legs with walking   [] Pain in legs at rest  [] History of DVT   [] Phlebitis   [] Swelling in legs   [] Varicose veins   [] Non-healing ulcers Pulmonary:   [] Uses home oxygen   [] Productive cough   [] Hemoptysis   [] Wheeze  [x] COPD   [] Asthma Neurologic:  [] Dizziness   [] Seizures   [] History of stroke   [] History of TIA  [] Aphasia   [] Vissual changes   [] Weakness or numbness in arm   [] Weakness or numbness in leg Musculoskeletal:   [] Joint swelling   [x] Joint pain   [x] Low back pain Hematologic:  [] Easy bruising  [] Easy bleeding   [] Hypercoagulable state   [] Anemic Gastrointestinal:  [] Diarrhea   [] Vomiting  [x] Gastroesophageal reflux/heartburn   [] Difficulty swallowing. Genitourinary:  [] Chronic kidney disease   [] Difficult urination  [] Frequent urination   [] Blood in urine Skin:  [] Rashes   [] Ulcers  Psychological:  [x] History of anxiety   []  History of major depression.  Physical Examination  There were no vitals filed for this visit. There is no height or weight on file to calculate BMI. Gen: WD/WN, NAD Head: [place]/AT, No temporalis wasting.  Ear/Nose/Throat: Hearing grossly intact, nares w/o erythema or drainage Eyes: PER, EOMI, sclera nonicteric.  Neck: Supple, no masses.  No bruit or  JVD.  Pulmonary:  Good air movement, no audible wheezing, no use of accessory muscles.  Cardiac: RRR, normal S1, S2, no Murmurs. Vascular:  mild trophic changes, no open wounds Vessel Right Left  Radial Palpable Palpable  PT Not Palpable Not Palpable  DP Not Palpable Not Palpable  Gastrointestinal: soft, non-distended. No guarding/no peritoneal signs.  Musculoskeletal: M/S 5/5 throughout.  No visible deformity.  Neurologic: CN 2-12 intact. Pain and light touch intact in extremities.  Symmetrical.  Speech is fluent. Motor exam as listed above. Psychiatric: Judgment intact, Mood & affect appropriate for pt's clinical situation. Dermatologic: No rashes or ulcers noted.  No changes consistent with cellulitis.   CBC Lab Results  Component Value Date   WBC 8.9 04/30/2022   HGB 9.7 (L) 04/30/2022   HCT 30.8 (L) 04/30/2022   MCV 90.9 04/30/2022   PLT 153 04/30/2022    BMET    Component Value Date/Time   NA 140 04/30/2022 0515   NA 137 03/02/2022 1136   NA 141 10/19/2014 1204   K 4.9 04/30/2022 0515   K 4.6 10/19/2014 1204   CL 110 04/30/2022 0515   CO2 25 04/30/2022 0515   CO2 26 10/19/2014 1204   GLUCOSE 100 (H) 04/30/2022 0515   GLUCOSE 91 10/19/2014 1204   BUN 11 11/04/2022 1113   BUN 11 03/02/2022 1136   BUN 10.8 10/19/2014 1204   CREATININE 0.78 11/04/2022 1113   CREATININE 0.83 07/05/2015 1042   CREATININE 0.9 10/19/2014 1204   CALCIUM 9.1 04/30/2022 0515   CALCIUM 9.6 10/19/2014 1204   GFRNONAA >60 11/04/2022 1113   GFRNONAA >89 07/05/2015 1042   GFRAA >60 03/10/2020 1219   GFRAA >89 07/05/2015 1042   CrCl cannot be calculated (Patient's most recent lab result is older than the maximum 21 days allowed.).  COAG Lab Results  Component Value Date   INR 1.1 04/19/2022    Radiology No results found.   Assessment/Plan There are no diagnoses  linked to this encounter.   Levora Dredge, MD  10/07/2023 10:11 AM

## 2023-10-08 ENCOUNTER — Ambulatory Visit (INDEPENDENT_AMBULATORY_CARE_PROVIDER_SITE_OTHER): Payer: Medicare HMO | Admitting: Vascular Surgery

## 2023-10-08 ENCOUNTER — Telehealth (INDEPENDENT_AMBULATORY_CARE_PROVIDER_SITE_OTHER): Payer: Self-pay

## 2023-10-08 DIAGNOSIS — K219 Gastro-esophageal reflux disease without esophagitis: Secondary | ICD-10-CM

## 2023-10-08 DIAGNOSIS — J439 Emphysema, unspecified: Secondary | ICD-10-CM

## 2023-10-08 DIAGNOSIS — I10 Essential (primary) hypertension: Secondary | ICD-10-CM

## 2023-10-08 DIAGNOSIS — E785 Hyperlipidemia, unspecified: Secondary | ICD-10-CM

## 2023-10-08 DIAGNOSIS — I70213 Atherosclerosis of native arteries of extremities with intermittent claudication, bilateral legs: Secondary | ICD-10-CM

## 2023-10-08 NOTE — Telephone Encounter (Signed)
Patient left a voicemail to reschedule his appt. Please reach out to him.

## 2023-10-08 NOTE — Telephone Encounter (Signed)
Patient called stating he is having heart surgery next Wednesday and they told him not to take Eliquis and Losartan. after last night 10/06/22. He is concerned because it will be a week with no blood thinner. I advised that he do what his cardiologist told him to do.   Please advise

## 2023-10-08 NOTE — Telephone Encounter (Signed)
That is correct.  If he does not do as his cardiologist has advised, They will likely cancel his surgery.

## 2023-10-09 ENCOUNTER — Ambulatory Visit (INDEPENDENT_AMBULATORY_CARE_PROVIDER_SITE_OTHER): Payer: Medicare HMO | Admitting: Nurse Practitioner

## 2023-10-09 NOTE — Telephone Encounter (Signed)
 Message given

## 2023-10-22 ENCOUNTER — Encounter: Payer: Self-pay | Admitting: Occupational Therapy

## 2023-10-22 NOTE — Therapy (Signed)
[facility] [address] [place], [place], [address] Phone: [phone]   Fax:  [phone]  Patient Details  Name: Martin [name] MRN: [id number] Date of Birth: 1960/11/05  OCCUPATIONAL THERAPY DISCHARGE SUMMARY  Visits from Start of Care: 7  Current functional level related to goals / functional outcomes: Pt had not met his goals to satisfactory levels and a progress reports had been prepared at his last appt 05/05/23.  Remaining deficits: Patient presented on 05/05/23 with palpable thick scarring, limited ROM and discomfort with use of dominant RUE.   Education / Equipment: Pt had various materials and education to continue with self-management. See tx notes for more details.   Patient discharged s/p speaking with front office staff re: cancellation policy.  Pt stated, "whatever" and told her to to cancel the rest of his appts.  He was  advised at that time that he will need a new referral for therapy.  Victorino Sparrow, OT 10/22/2023, 3:40 PM  [facility] [address] [place], [place], [address] Phone: [phone]   Fax:  [phone]

## 2023-11-18 ENCOUNTER — Ambulatory Visit (INDEPENDENT_AMBULATORY_CARE_PROVIDER_SITE_OTHER): Payer: Medicare HMO | Admitting: Nurse Practitioner

## 2023-11-18 ENCOUNTER — Encounter (INDEPENDENT_AMBULATORY_CARE_PROVIDER_SITE_OTHER): Payer: Medicare HMO

## 2023-11-28 ENCOUNTER — Other Ambulatory Visit (INDEPENDENT_AMBULATORY_CARE_PROVIDER_SITE_OTHER): Payer: Self-pay | Admitting: Nurse Practitioner

## 2023-12-05 ENCOUNTER — Other Ambulatory Visit (HOSPITAL_COMMUNITY)

## 2023-12-05 ENCOUNTER — Ambulatory Visit (INDEPENDENT_AMBULATORY_CARE_PROVIDER_SITE_OTHER)

## 2023-12-05 ENCOUNTER — Encounter (HOSPITAL_COMMUNITY): Payer: Self-pay | Admitting: *Deleted

## 2023-12-05 ENCOUNTER — Ambulatory Visit (HOSPITAL_COMMUNITY)
Admission: EM | Admit: 2023-12-05 | Discharge: 2023-12-05 | Disposition: A | Discharge status: Home / Self Care | Attending: Family Medicine | Admitting: Family Medicine

## 2023-12-05 ENCOUNTER — Other Ambulatory Visit: Payer: Self-pay

## 2023-12-05 DIAGNOSIS — R0602 Shortness of breath: Secondary | ICD-10-CM

## 2023-12-05 DIAGNOSIS — L98499 Non-pressure chronic ulcer of skin of other sites with unspecified severity: Secondary | ICD-10-CM

## 2023-12-05 LAB — POC COVID19/FLU A&B COMBO
Covid Antigen, POC: NEGATIVE
Influenza A Antigen, POC: NEGATIVE
Influenza B Antigen, POC: NEGATIVE

## 2023-12-05 MED ORDER — SILVER SULFADIAZINE 1 % EX CREA: 1.0000 | TOPICAL_CREAM | Freq: Every day | CUTANEOUS | 0 refills | Status: DC

## 2023-12-05 MED ORDER — MUPIROCIN CALCIUM 2 % EX CREA: 1.0000 | TOPICAL_CREAM | Freq: Two times a day (BID) | CUTANEOUS | 0 refills | Status: DC

## 2023-12-05 MED ORDER — CLOTRIMAZOLE-BETAMETHASONE 1-0.05 % EX CREA: TOPICAL_CREAM | CUTANEOUS | 0 refills | Status: DC

## 2023-12-05 MED ORDER — AZELASTINE HCL 0.1 % NA SOLN: 1.0000 | Freq: Two times a day (BID) | NASAL | 1 refills | Status: DC

## 2023-12-05 NOTE — ED Provider Notes (Signed)
 [facility]   Note:  This document was prepared using Dragon voice recognition software and may include unintentional dictation errors.  MRN: [id number] DOB: [birth year]  Subjective:   Martin [name] is a 63 y.o. male presenting for dyspnea for the last couple of days, fatigue and mild weakness, cough with sore throat.  Patient reports that he is 8 weeks post CABG.  Patient denies any chest pain, dizziness, palpitations.  Patient states that several people in his household have been sick with viral illness for the last week or two.   Patient also reports a perianal ulcer that has been bothering him since his surgery 8 weeks ago.  Patient states that he has history of anal rectal cancer and history of hemorrhoids patient has been using hemorrhoid cream but states that is not getting better it hurts to sit and he is concern for secondary infection or recurrent cancer.  No current facility-administered medications for this encounter.  Current Outpatient Medications:    atorvastatin (LIPITOR) 10 MG tablet, Take 1 tablet (10 mg total) by mouth daily. Additional refills per primary provider, Disp: 90 tablet, Rfl: 3   azelastine (ASTELIN) 0.1 % nasal spray, Place 1 spray into both nostrils 2 (two) times daily. Use in each nostril as directed, Disp: 30 mL, Rfl: 1   clopidogrel (PLAVIX) 75 MG tablet, Take 1 tablet (75 mg total) by mouth daily., Disp: 30 tablet, Rfl: 11   clotrimazole-betamethasone (LOTRISONE) cream, Apply to affected area 2 times daily prn, Disp: 45 each, Rfl: 0   METHADONE HCL PO, Take 47 mg by mouth daily., Disp: , Rfl:    mupirocin cream (BACTROBAN) 2 %, Apply 1 Application topically 2 (two) times daily., Disp: 15 g, Rfl: 0   silver sulfADIAZINE (SILVADENE) 1 % cream, Apply 1 Application topically daily., Disp: 75 each, Rfl: 0   albuterol (VENTOLIN HFA) 108 (90 Base) MCG/ACT inhaler, Inhale 2 puffs into the lungs every 6 (six) hours as needed for wheezing or shortness  of breath., Disp: 18 g, Rfl: 0   amoxicillin-clavulanate (AUGMENTIN) 875-125 MG tablet, SMARTSIG:1 Tablet(s) By Mouth Every 12 Hours, Disp: , Rfl:    apixaban (ELIQUIS) 2.5 MG TABS tablet, Take 1 tablet by mouth twice daily, Disp: 60 tablet, Rfl: 3   EPINEPHrine (EPIPEN 2-PAK) 0.3 mg/0.3 mL IJ SOAJ injection, Inject 0.3 mg (1 pen) into the muscle as needed for anaphylaxis., Disp: 2 each, Rfl: 2   furosemide (LASIX) 20 MG tablet, Take 20 mg by mouth daily., Disp: , Rfl:    losartan (COZAAR) 25 MG tablet, Take by mouth., Disp: , Rfl:    Multiple Vitamin (MULTIVITAMIN ADULT PO), Take by mouth daily., Disp: , Rfl:    ondansetron (ZOFRAN) 4 MG tablet, Take 1 tablet (4 mg total) by mouth every 4 (four) hours as needed for nausea or vomiting., Disp: 6 tablet, Rfl: 0   oxymetazoline (AFRIN) 0.05 % nasal spray, Place 1 spray into both nostrils 2 (two) times daily., Disp: 30 mL, Rfl: 0   polyethylene glycol powder (GLYCOLAX/MIRALAX) 17 GM/SCOOP powder, Take by mouth., Disp: , Rfl:    SPIRIVA RESPIMAT 2.5 MCG/ACT AERS, Inhale 2 puffs into the lungs daily., Disp: , Rfl:    spironolactone (ALDACTONE) 25 MG tablet, Take 25 mg by mouth daily., Disp: , Rfl:    tamsulosin (FLOMAX) 0.4 MG CAPS capsule, Take 1 capsule (0.4 mg total) by mouth at bedtime., Disp: 90 capsule, Rfl: 1   traZODone (DESYREL) 50 MG tablet, Take 50 mg by mouth  at bedtime., Disp: , Rfl:    Allergies  Allergen Reactions   Aspirin Swelling    Lip swelling   Yellow Jacket Venom [Bee Venom] Anaphylaxis   Codeine Nausea Only   Ibuprofen     On blood thinners   Morphine And Codeine Itching    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    Anxiety    Arthritis    DDD lumbar, arthritis knees   Complication of anesthesia    pt states he woke up during a vascular procedure   COPD (chronic obstructive pulmonary disease) (HCC)    DJD (degenerative  joint disease) of lumbar spine    GERD (gastroesophageal reflux disease)    Hemorrhoid    internal   History of bowel resection 04/19/2017   Hyperlipidemia    Hypertension    Pt states he's never been told or treated for HTN   Illicit drug use    + UDS cocaine, THC, opiates 10/11/21   Inguinal hernia    MSSA bacteremia 10/11/2021   s/p IV cefazolin, po Linezolid; 10/16/21 TEE w/o vegetations.   Peripheral vascular disease (HCC)    Pneumonia    as child, cough at present with no fever   Pneumonia    as child, cough at present with no fever    Radiation 11/19/11-01/08/12   5040 cGy 28 fx Pelvis and inguinal area   Stroke [facility])    no weaknes or paralysis   Substance abuse (HCC)    last use was Jan. 2024; patient states he has "been clean" since then     Past Surgical History:  Procedure Laterality Date   APPENDECTOMY     age 81 or 63   CARPAL TUNNEL RELEASE Right 02/04/2023   Procedure: RIGHT CARPAL TUNNEL RELEASE;  Surgeon: Tarry Kos, MD;  Location: [facility];  Service: Orthopedics;  Laterality: Right;   EXAMINATION UNDER ANESTHESIA  10/14/2011   Procedure: EXAM UNDER ANESTHESIA;  Surgeon: Velora Heckler, MD;  Location: [facility];  Service: General;  Laterality: N/A;  exam under anethesia, Excision of mass anal canal, 1.5cm   INGUINAL HERNIA REPAIR  05/11/2012   Procedure: HERNIA REPAIR INGUINAL ADULT;  Surgeon: Velora Heckler, MD;  Location: [facility];  Service: General;  Laterality: Left;  left inguinal hernia repair with mesh   LOWER EXTREMITY ANGIOGRAPHY Left 07/02/2021   Procedure: LOWER EXTREMITY ANGIOGRAPHY;  Surgeon: Renford Dills, MD;  Location: [facility] INVASIVE CV LAB;  Service: Cardiovascular;  Laterality: Left;   LOWER EXTREMITY ANGIOGRAPHY Right 07/16/2021   Procedure: LOWER EXTREMITY ANGIOGRAPHY;  Surgeon: Renford Dills, MD;  Location: [facility] INVASIVE CV LAB;  Service: Cardiovascular;  Laterality: Right;   LOWER EXTREMITY ANGIOGRAPHY  Left 08/27/2021   Procedure: LOWER EXTREMITY ANGIOGRAPHY;  Surgeon: Renford Dills, MD;  Location: [facility] INVASIVE CV LAB;  Service: Cardiovascular;  Laterality: Left;   LOWER EXTREMITY ANGIOGRAPHY Left 01/14/2022   Procedure: Lower Extremity Angiography;  Surgeon: Renford Dills, MD;  Location: [facility] LAB;  Service: Cardiovascular;  Laterality: Left;   LOWER EXTREMITY ANGIOGRAPHY Left 11/04/2022   Procedure: Lower Extremity Angiography;  Surgeon: Renford Dills, MD;  Location: [facility]AS[facility] LAB;  Service: Cardiovascular;  Laterality: Left;   SMALL INTESTINE SURGERY  04/19/2017   exploratory lap, partial small bowel resection for perforated ulcer/abcess, radiation enteritis   surgical  pathology   10/14/2011   squamous cell ca of anus   TEE WITHOUT CARDIOVERSION N/A 10/16/2021   Procedure: TRANSESOPHAGEAL ECHOCARDIOGRAM (TEE);  Surgeon: Chrystie Nose, MD;  Location: [facility];  Service: Cardiovascular;  Laterality: N/A;   TOOTH EXTRACTION N/A 12/04/2021   Procedure: DENTAL RESTORATION/EXTRACTIONS;  Surgeon: Ocie Doyne, DMD;  [facility];  Service: Oral Surgery;  Laterality: N/A;   transanal excision  10/14/2011   Dr.Todd Gerkin    Family History  Problem Relation Age of Onset   Colon cancer Father    Asthma Father    Cancer Father        prostate   Hypertension Father    Asthma Mother    Hypertension Mother    Stomach cancer Neg Hx    Esophageal cancer Neg Hx     Social History   Tobacco Use   Smoking status: Every Day    Current packs/day: 1.00    Average packs/day: 1 pack/day for 47.0 years (47.0 ttl pk-yrs)    Types: Cigarettes   Smokeless tobacco: Never   Tobacco comments:    Cutting back>> Quit Smart card supplied  Vaping Use   Vaping status: Never Used  Substance Use Topics   Alcohol use: Not Currently    Comment: occasional   Drug use: Not Currently    Types: Marijuana, "Crack" cocaine    Comment: hx cocaine use- states stopped 6  months ago but last positive drug screen 09/2022    ROS Refer to HPI for ROS details.  Objective:   Vitals: BP (!) 156/83   Pulse 77   Temp 98.4 F (36.9 C)   Resp 18   SpO2 100%   Physical Exam Vitals and nursing note reviewed.  Constitutional:      General: He is not in acute distress.    Appearance: He is well-developed. He is not ill-appearing or toxic-appearing.  HENT:     Head: Normocephalic.     Nose: Congestion and rhinorrhea present.     Mouth/Throat:     Mouth: Mucous membranes are moist.     Pharynx: Oropharynx is clear.  Cardiovascular:     Rate and Rhythm: Normal rate and regular rhythm.     Heart sounds: No murmur heard. Pulmonary:     Effort: Pulmonary effort is normal. No respiratory distress.     Breath sounds: Normal breath sounds. No decreased breath sounds, wheezing, rhonchi or rales.  Genitourinary:   Musculoskeletal:        General: No swelling.  Skin:    General: Skin is warm and dry.  Neurological:     General: No focal deficit present.     Mental Status: He is alert and oriented to person, place, and time.  Psychiatric:        Mood and Affect: Mood normal.     Procedures  Results for orders placed or performed during the hospital encounter of 12/05/23 (from the past 24 hours)  POC Covid19/Flu A&B Antigen     Status: Normal   Collection Time: 12/05/23  3:37 PM  Result Value Ref Range   Influenza A Antigen, POC Negative    Influenza B Antigen, POC Negative    Covid Antigen, POC Negative    *Note: Due to a large number of results and/or encounters for the requested time period, some results have not been displayed. A complete set of results can be found in Results Review.    Assessment and Plan :   PDMP not  reviewed this encounter.  1. Shortness of breath   2. Skin ulcer of perianal region, unspecified ulcer stage (HCC)    1. Shortness of breath (Primary) - DG Chest 2 View x-ray performed in UC shows no acute cardiopulmonary  processes, no sign of consolidation or pneumonia. - EKG 12-Lead EKG performed in UC shows normal sinus rhythm with slightly prolonged QT, ventricular rate of 75 bpm, no STEMI. - azelastine (ASTELIN) 0.1 % nasal spray; Place 1 spray into both nostrils 2 (two) times daily. Use in each nostril as directed  Dispense: 30 mL; Refill: 1 -Recommend follow-up with cardiology as planned for later this week for further evaluation and management.  2. Skin ulcer of perianal region, unspecified ulcer stage (HCC) - mupirocin cream (BACTROBAN) 2 %; Apply 1 Application topically 2 (two) times daily.  Dispense: 15 g; Refill: 0 - silver sulfADIAZINE (SILVADENE) 1 % cream; Apply 1 Application topically daily.  Dispense: 75 each; Refill: 0 - clotrimazole-betamethasone (LOTRISONE) cream; Apply to affected area 2 times daily prn  Dispense: 45 each; Refill: 0 - Ambulatory referral to Surgical Oncology for follow-up evaluation of perianal ulcer and previous history of anal cancer. -Continue to monitor symptoms for any change in severity if there is any escalation of current symptoms or development of new symptoms follow-up in ER for further evaluation and management.  Lucky Cowboy   [place], [place], [place] 12/05/23 [phone]

## 2023-12-05 NOTE — Discharge Instructions (Signed)
 1. Shortness of breath (Primary) - DG Chest 2 View x-ray performed in UC shows no acute cardiopulmonary processes, no sign of consolidation or pneumonia. - EKG 12-Lead EKG performed in UC shows normal sinus rhythm with slightly prolonged QT, ventricular rate of 75 bpm, no STEMI. - azelastine (ASTELIN) 0.1 % nasal spray; Place 1 spray into both nostrils 2 (two) times daily. Use in each nostril as directed  Dispense: 30 mL; Refill: 1 -Recommend follow-up with cardiology as planned for later this week for further evaluation and management.  2. Skin ulcer of perianal region, unspecified ulcer stage (HCC) - mupirocin cream (BACTROBAN) 2 %; Apply 1 Application topically 2 (two) times daily.  Dispense: 15 g; Refill: 0 - silver sulfADIAZINE (SILVADENE) 1 % cream; Apply 1 Application topically daily.  Dispense: 75 each; Refill: 0 - clotrimazole-betamethasone (LOTRISONE) cream; Apply to affected area 2 times daily prn  Dispense: 45 each; Refill: 0 - Ambulatory referral to Surgical Oncology for follow-up evaluation of perianal ulcer and previous history of anal cancer.

## 2023-12-05 NOTE — ED Triage Notes (Signed)
 PT reports SHOB and is 8 weeks status post CABG. Pt reports he feel tired and weak. Pt has a cough with sore throat.

## 2023-12-10 ENCOUNTER — Telehealth (HOSPITAL_COMMUNITY): Payer: Self-pay

## 2023-12-10 NOTE — Telephone Encounter (Signed)
 Called and spoke with pt in regards to CR, pt stated he is not interested at this time.

## 2023-12-10 NOTE — Telephone Encounter (Signed)
 Outside/paper referral received by Dr. Althea Grimmer from [facility]. Will fax over Physician order and request further documents. Insurance benefits and eligibility to be determined.

## 2023-12-14 ENCOUNTER — Encounter: Payer: Self-pay | Admitting: *Deleted

## 2023-12-14 ENCOUNTER — Encounter: Payer: Self-pay | Admitting: Nutrition

## 2023-12-14 ENCOUNTER — Inpatient Hospital Stay: Attending: Oncology | Admitting: Oncology

## 2023-12-14 ENCOUNTER — Other Ambulatory Visit: Payer: Self-pay | Admitting: *Deleted

## 2023-12-14 ENCOUNTER — Inpatient Hospital Stay

## 2023-12-14 VITALS — BP 120/72 | HR 72 | Temp 97.9°F | Resp 18 | Ht 71.0 in | Wt 134.4 lb

## 2023-12-14 DIAGNOSIS — F1120 Opioid dependence, uncomplicated: Secondary | ICD-10-CM | POA: Insufficient documentation

## 2023-12-14 DIAGNOSIS — F17210 Nicotine dependence, cigarettes, uncomplicated: Secondary | ICD-10-CM | POA: Insufficient documentation

## 2023-12-14 DIAGNOSIS — K219 Gastro-esophageal reflux disease without esophagitis: Secondary | ICD-10-CM | POA: Insufficient documentation

## 2023-12-14 DIAGNOSIS — C210 Malignant neoplasm of anus, unspecified: Secondary | ICD-10-CM

## 2023-12-14 DIAGNOSIS — K449 Diaphragmatic hernia without obstruction or gangrene: Secondary | ICD-10-CM | POA: Insufficient documentation

## 2023-12-14 DIAGNOSIS — I739 Peripheral vascular disease, unspecified: Secondary | ICD-10-CM | POA: Insufficient documentation

## 2023-12-14 DIAGNOSIS — G893 Neoplasm related pain (acute) (chronic): Secondary | ICD-10-CM | POA: Insufficient documentation

## 2023-12-14 DIAGNOSIS — Z923 Personal history of irradiation: Secondary | ICD-10-CM | POA: Insufficient documentation

## 2023-12-14 DIAGNOSIS — C20 Malignant neoplasm of rectum: Secondary | ICD-10-CM | POA: Insufficient documentation

## 2023-12-14 DIAGNOSIS — J449 Chronic obstructive pulmonary disease, unspecified: Secondary | ICD-10-CM | POA: Insufficient documentation

## 2023-12-14 DIAGNOSIS — Z9221 Personal history of antineoplastic chemotherapy: Secondary | ICD-10-CM | POA: Insufficient documentation

## 2023-12-14 DIAGNOSIS — I10 Essential (primary) hypertension: Secondary | ICD-10-CM | POA: Insufficient documentation

## 2023-12-14 DIAGNOSIS — E785 Hyperlipidemia, unspecified: Secondary | ICD-10-CM | POA: Insufficient documentation

## 2023-12-14 DIAGNOSIS — K56609 Unspecified intestinal obstruction, unspecified as to partial versus complete obstruction: Secondary | ICD-10-CM | POA: Insufficient documentation

## 2023-12-14 DIAGNOSIS — K625 Hemorrhage of anus and rectum: Secondary | ICD-10-CM | POA: Insufficient documentation

## 2023-12-14 DIAGNOSIS — Z8673 Personal history of transient ischemic attack (TIA), and cerebral infarction without residual deficits: Secondary | ICD-10-CM | POA: Insufficient documentation

## 2023-12-14 MED ORDER — HYDROCODONE-ACETAMINOPHEN 5-325 MG PO TABS: 1.0000 | ORAL_TABLET | Freq: Four times a day (QID) | ORAL | 0 refills | Status: DC | PRN

## 2023-12-14 NOTE — Progress Notes (Signed)
 [facility] New Patient Consult   Requesting MD: Martin Cowboy, [address] [place],  [place] [address]   Martin [name] 63 y.o.  [birth year]    Reason for Consult: Anal cancer   HPI: [name] reports noticing soreness at the anal region beginning approximately 2 months ago when he was hospitalized for aortic valve replacement surgery.  He underwent aortic valve replacement on 10/14/2023.  He reports pain with bowel movements and bleeding when he wipes.  He now wears a briefs due to rectal drainage. He was diagnosed with anal cancer in 2013 and was treated with chemotherapy/radiation.  He was last seen in medical oncology in 2013.  He was last seen in radiation oncology in September 2020.  A rectal exam 06/02/2019 was negative for recurrent anal cancer.  He is maintained on methadone from a pain clinic due to chronic narcotic addiction.  He reports the methadone is being weaned.  He is taking no other pain medication.  He was seen in the emergency room 12/05/2023 and prescribed Bactroban, Silvadene, and Lotrisone cream for ulceration at the perianal region.   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    Anxiety    Arthritis    DDD lumbar, arthritis knees   Complication of anesthesia    pt states he woke up during a vascular procedure   COPD (chronic obstructive pulmonary disease) (HCC)    DJD (degenerative joint disease) of lumbar spine    GERD (gastroesophageal reflux disease)    Hemorrhoid    internal   History of bowel resection 04/19/2017   Hyperlipidemia    Hypertension    Pt states he's never been told or treated for HTN   Illicit drug use    + UDS cocaine, THC, opiates 10/11/21   Inguinal hernia    MSSA bacteremia 10/11/2021   s/p IV cefazolin, po Linezolid; 10/16/21 TEE w/o vegetations.   Peripheral vascular disease (HCC)    Pneumonia    as  child, cough at present with no fever   Pneumonia    as child, cough at present with no fever    Radiation 11/19/11-01/08/12   5040 cGy 28 fx Pelvis and inguinal area   Stroke [facility])    no weaknes or paralysis   Substance abuse [facility])    last use was Jan. 2024; patient states he has "been clean" since then    .  Small bowel obstruction August 2018   .  Narcotic addiction  Past Surgical History:  Procedure Laterality Date   APPENDECTOMY     age 7 or 53   CARPAL TUNNEL RELEASE Right 02/04/2023   Procedure: RIGHT CARPAL TUNNEL RELEASE;  Surgeon: Martin Kos, MD;  Location: [facility];  Service: Orthopedics;  Laterality: Right;   EXAMINATION UNDER ANESTHESIA  10/14/2011   Procedure: EXAM UNDER ANESTHESIA;  Surgeon: Martin Heckler, MD;  Location: [facility];  Service: General;  Laterality: N/A;  exam under anethesia, Excision of mass anal canal, 1.5cm   INGUINAL HERNIA REPAIR  05/11/2012   Procedure: HERNIA REPAIR INGUINAL ADULT;  Surgeon: Martin Heckler, MD;  Location:  SURGERY CENTER;  Service: General;  Laterality: Left;  left inguinal hernia repair with mesh   LOWER EXTREMITY ANGIOGRAPHY Left 07/02/2021   Procedure: LOWER EXTREMITY ANGIOGRAPHY;  Surgeon: Martin Dills, MD;  [facility]  CV LAB;  Service: Cardiovascular;  Laterality: Left;   LOWER EXTREMITY ANGIOGRAPHY Right 07/16/2021   Procedure: LOWER EXTREMITY ANGIOGRAPHY;  Surgeon: Martin Dills, MD;  Location: [facility] INVASIVE CV LAB;  Service: Cardiovascular;  Laterality: Right;   LOWER EXTREMITY ANGIOGRAPHY Left 08/27/2021   Procedure: LOWER EXTREMITY ANGIOGRAPHY;  Surgeon: Martin Dills, MD;  Location: [facility] INVASIVE CV LAB;  Service: Cardiovascular;  Laterality: Left;   LOWER EXTREMITY ANGIOGRAPHY Left 01/14/2022   Procedure: Lower Extremity Angiography;  Surgeon: Martin Dills, MD;  Location: [facility] INVASIVE CV LAB;  Service: Cardiovascular;  Laterality: Left;   LOWER EXTREMITY ANGIOGRAPHY  Left 11/04/2022   Procedure: Lower Extremity Angiography;  Surgeon: Martin Dills, MD;  Location: [facility] INVASIVE CV LAB;  Service: Cardiovascular;  Laterality: Left;   SMALL INTESTINE SURGERY  04/19/2017   exploratory lap, partial small bowel resection for perforated ulcer/abcess, radiation enteritis   surgical pathology   10/14/2011   squamous cell ca of anus   TEE WITHOUT CARDIOVERSION N/A 10/16/2021   Procedure: TRANSESOPHAGEAL ECHOCARDIOGRAM (TEE);  Surgeon: Martin Nose, MD;  Location: [facility] ENDOSCOPY;  Service: Cardiovascular;  Laterality: N/A;   TOOTH EXTRACTION N/A 12/04/2021   Procedure: DENTAL RESTORATION/EXTRACTIONS;  Surgeon: Martin [name], DMD;  Location[facility];  Service: Oral Surgery;  Laterality: N/A;   transanal excision  10/14/2011   Dr.Todd [name]    .  Aortic valve replacement 10/14/2023  Medications: Reviewed  Allergies:  Allergies  Allergen Reactions   Aspirin Swelling    Lip swelling   Yellow Jacket Venom [Bee Venom] Anaphylaxis   Zolpidem Other (See Comments)   Codeine Nausea Only   Ibuprofen     On blood thinners   Morphine And Codeine Itching    Family history: Father had prostate cancer.  Social History:   He lives in a boardinghouse.  He is disabled.  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  Smokes cigarettes.  No alcohol use.  No transfusion history.  No risk factor for HIV or hepatitis.  ROS:   Positives include: Anal pain and bleeding, rectal drainage, soreness over the chest following soreness over the chest following aortic valve replacement  A complete ROS was otherwise negative.  Physical Exam:  Blood pressure 120/72, pulse 72, temperature 97.9 F (36.6 C), resp. rate 18, height 5\' 11"  (1.803 m), weight 134 lb 6.4 oz (61 kg), SpO2 98%.  HEENT: Edentulous, oropharynx without visible mass, neck without mass Lungs: Clear bilaterally, no respiratory distress Cardiac: Regular rate and rhythm Abdomen: No hepatosplenomegaly,  nontender, no mass  Vascular: No leg edema Lymph nodes: No cervical, supraclavicular, axillary, or inguinal nodes Neurologic: A and oriented, the motor exam appears intact in the upper and lower extremities bilaterally Skin: Multiple tattoos, no rash Musculoskeletal: No spine tenderness Rectal: There is firm ulcerated tissue surrounding the anus, difficult to identify the anus.  On anal exam there is firm raised tissue for 2 to 3 cm proximal into the anal canal.  There is firmness surrounding the anus and perineum.  Radiation pigmentation changes at the perineum.  LAB:  CBC  Lab Results  Component Value Date   WBC 8.9 04/30/2022   HGB 9.7 (L) 04/30/2022   HCT 30.8 (L) 04/30/2022   MCV 90.9 04/30/2022   PLT 153 04/30/2022   NEUTROABS 8.9 (H) 04/28/2022        CMP  Lab Results  Component Value Date   NA 140 04/30/2022   K 4.9 04/30/2022   CL 110 04/30/2022  CO2 25 04/30/2022   GLUCOSE 100 (H) 04/30/2022   BUN 11 11/04/2022   CREATININE 0.78 11/04/2022   CALCIUM 9.1 04/30/2022   PROT 6.9 04/28/2022   ALBUMIN 3.6 04/28/2022   AST 19 04/28/2022   ALT 17 04/28/2022   ALKPHOS 93 04/28/2022   BILITOT <0.1 (L) 04/28/2022   GFRNONAA >60 11/04/2022   GFRAA >60 03/10/2020        Assessment/Plan:   Anal cancer Transanal excision of anal mass 10/14/2011, squamous cell carcinoma with positive surgical margins Radiation 11/19/2011 - 01/08/2012 Cycle one 5-FU/Mitomycin-C 07/2012, cycle two 5-FU/Mitomycin-C 12/15/2011 CT abdomen/pelvis 07/11/2023: Poorly defined hyperenhancing soft tissue at the anus, no pathology enlarged lymph nodes Aortic valve replacement 10/14/2023 COPD Narcotic addiction-followed in a pain clinic Rectal bleeding and pain secondary to #1 6.   Peripheral vascular disease 7.   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   MSSA bacteremia February  2023   Disposition:   Mr. Delucia has a remote history of anal cancer.  He presents with anal pain, bleeding, and firm ulcerated tissue surrounding the anus and perineum.  He appears to have a local recurrence of anal cancer.  I discussed the likely diagnosis with Mr. Edmonston.  He will be referred for a staging PET scan and surgical evaluation/diagnostic biopsy.  He has previously been treated with chemotherapy/radiation and has multiple comorbid conditions.  He may be a candidate for an APR or diverting colostomy if the staging evaluation confirms localized disease.  We will consider systemic treatment options if there is imaging evidence of metastatic disease.  He will begin a trial of hydrocodone for pain.  He continues follow-up in the pain clinic.  He will return for an office visit in 2-3 weeks.  Thornton Papas, MD  12/14/2023, 3:20 PM

## 2023-12-14 NOTE — Progress Notes (Signed)
 Provided patient a bag of groceries and #4 cans of Ensure strawberry.

## 2023-12-14 NOTE — Progress Notes (Signed)
 PATIENT NAVIGATOR PROGRESS NOTE  Name: Martin [name] Date: 12/14/2023 MRN: [id number]  DOB: February 27, 1961  SDOH Barriers (Status:  New goal.) Short Term Goal Patient interviewed and SDOH assessment performed        SDOH Interventions    Flowsheet Row Office Visit from 12/14/2023 in [facility] [place]  SDOH Interventions   Food Insecurity Interventions Other (Comment)  [Provided bag of groceries and Ensure]  Transportation Interventions Intervention Not Indicated  Utilities Interventions Intervention Not Indicated      Patient interviewed and appropriate assessments performed   Reason for visit:  New patient appt  Comments:  Met with [name] during visit with Dr Truett Perna  Given information on anal cancer Scheduled for PET scan on 12/21/23 at [facility] Referral sent to Dr Maisie Fus at [facility] for surgical consult Given contact information to call with any issues or questions Referral to SW Referral to Nutrition     Time spent counseling/coordinating care: > 60 minutes

## 2023-12-15 ENCOUNTER — Telehealth: Payer: Self-pay

## 2023-12-15 NOTE — Telephone Encounter (Signed)
 [facility] Clinical Social Work  Clinical Social Work was referred by nurse for assessment of psychosocial needs.  Clinical Social Worker contacted patient by phone to offer support and assess for needs.  Patient unable to speak at time of call. Call scheduled for 4/09 at 1:00 PM   Marguerita Merles, LCSW  Clinical Social Worker [facility]

## 2023-12-17 ENCOUNTER — Telehealth: Payer: Self-pay

## 2023-12-17 NOTE — Telephone Encounter (Signed)
 [facility] Clinical Social Work  Clinical Social Work was referred by new patient protocol for assessment of psychosocial needs.  Clinical Social Worker attempted to contact patient by phone to offer support and assess for needs. CSW left VM with direct contact.  Marguerita Merles, LCSW  Clinical Social Worker [facility]

## 2023-12-21 ENCOUNTER — Encounter (HOSPITAL_COMMUNITY): Admission: RE | Admit: 2023-12-21 | Source: Ambulatory Visit

## 2023-12-22 ENCOUNTER — Ambulatory Visit (HOSPITAL_COMMUNITY)
Admission: RE | Admit: 2023-12-22 | Discharge: 2023-12-22 | Disposition: A | Discharge status: Home / Self Care | Source: Ambulatory Visit | Attending: Oncology | Admitting: Oncology

## 2023-12-22 DIAGNOSIS — C210 Malignant neoplasm of anus, unspecified: Secondary | ICD-10-CM | POA: Insufficient documentation

## 2023-12-22 LAB — GLUCOSE, CAPILLARY: Glucose-Capillary: 81 mg/dL (ref 70–99)

## 2023-12-22 MED ORDER — FLUDEOXYGLUCOSE F - 18 (FDG) INJECTION
6.8000 | Freq: Once | INTRAVENOUS | Status: AC | PRN
  Administered 2023-12-22: 6.8 via INTRAVENOUS

## 2023-12-25 ENCOUNTER — Telehealth: Payer: Self-pay | Admitting: *Deleted

## 2023-12-25 NOTE — Telephone Encounter (Signed)
 Notified Martin [name] via voice mail that PET reveals uptake at the anal canal consistent with local recurrence of anal cancer, no evidence of distant metastatic disease, follow-up as scheduled. His case will be discussed at the GI conference 12/30/2023. Will see him on 4/29

## 2023-12-25 NOTE — Telephone Encounter (Signed)
-----   Message from Coni Deep sent at 12/25/2023  3:13 PM EDT ----- Please call patient,, the PET reveals uptake at the anal canal consistent with local recurrence of anal cancer, no Midence of distant metastatic disease, follow-up as scheduled  Please be sure he is scheduled for the GI conference 12/30/2023

## 2023-12-30 ENCOUNTER — Other Ambulatory Visit: Payer: Self-pay

## 2023-12-30 ENCOUNTER — Inpatient Hospital Stay: Admitting: Nutrition

## 2023-12-30 NOTE — Progress Notes (Signed)
 The proposed treatment discussed in conference is for discussion purpose only and is not a binding recommendation.  The patients have not been physically examined, or presented with their treatment options.  Therefore, final treatment plans cannot be decided.

## 2023-12-30 NOTE — Progress Notes (Signed)
 63 yo male diagnosed with Anal cancer with local recurrence. He is followed by Dr. Scherrie Curt.  PMH includes: anal cancer 2013 with chemo/radiation, anxiety, DDD, COPD, GERD, Bowel Resection, HLD, HTN, Stroke, substance abuse.  Medications include lasix , Methadone , and Vitamin D   Labs reviewed.  Height: 5'11" Weight: 134 pounds 6.4 oz. UBW: 140-150 pounds. BMI: 18.74.  Contacted patient by telephone for consult. He did not answer. Left message with name and phone number for return call. Will reschedule as able.

## 2023-12-31 ENCOUNTER — Other Ambulatory Visit: Payer: Self-pay | Admitting: Oncology

## 2023-12-31 ENCOUNTER — Telehealth: Payer: Self-pay

## 2023-12-31 MED ORDER — OXYCODONE HCL 5 MG PO TABS: 5.0000 mg | ORAL_TABLET | ORAL | 0 refills | Status: DC | PRN

## 2023-12-31 NOTE — Telephone Encounter (Signed)
 The patient reports that his Norco/Vicodin is not adequately alleviating his pain. He is currently taking two pills every six hours. The pain is localized in the rectal area. He is seeking alternative pain management options due to insufficient relief.

## 2024-01-04 ENCOUNTER — Ambulatory Visit: Payer: Self-pay | Admitting: General Surgery

## 2024-01-04 ENCOUNTER — Other Ambulatory Visit: Payer: Self-pay | Admitting: General Surgery

## 2024-01-04 DIAGNOSIS — K6289 Other specified diseases of anus and rectum: Secondary | ICD-10-CM

## 2024-01-04 NOTE — H&P (Signed)
 REFERRING PHYSICIAN:  Jeanann Midget,*  PROVIDER:  Denese Finn, MD  MRN: [id number] DOB: [birth year] DATE OF ENCOUNTER: 01/04/2024  Subjective   Chief Complaint: New Consultation ( Anal cancer, )     History of Present Illness: Martin [name] is a 63 y.o. male who is seen today as an office consultation at the request of Dr. Scherrie Curt for evaluation of New Consultation ( Anal cancer, ) .  Patient is status post transanal excision, chemotherapy and radiation for anal SCC in 2013.  He had a complete response to therapy and continued to have negative examinations by radiation oncology.  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  He states that he began to have pain in his anal region in February 2025.  He noticed bleeding with bowel movements as well. No biopsy has been performed. He also underwent aortic valve replacement in February 2025.  Past medical history also includes COPD and peripheral vascular disease.   Review of Systems: A complete review of systems was obtained from the patient.  I have reviewed this information and discussed as appropriate with the patient.  See HPI as well for other ROS.    Medical History: Past Medical History:  Diagnosis Date   Anal cancer (CMS/HHS-HCC)    Anxiety    Arthritis    COPD (chronic obstructive pulmonary disease) (CMS/HHS-HCC)    GERD (gastroesophageal reflux disease)    Heart valve disease    History of stroke    Hyperlipidemia    Hypertension    Substance abuse (CMS/HHS-HCC)     There is no problem list on file for this patient.   Past Surgical History:  Procedure Laterality Date   HERNIA REPAIR  2013   Inguinal   RESECTION SMALL BOWEL  2018   SBO   APPENDECTOMY     VASCULAR SURGERY       Allergies  Allergen Reactions   Aspirin  Swelling    Lip swelling   Morphine  Itching    Pt stated little itching, not severe    Codeine Nausea    Current Outpatient Medications on File Prior to Visit  Medication Sig Dispense Refill   amoxicillin -clavulanate (AUGMENTIN ) 875-125 mg tablet Take 1 tablet by mouth every 12 (twelve) hours     azithromycin  (ZITHROMAX ) 250 MG tablet TAKE 2 TABLETS BY MOUTH ON DAY 1, AND THEN TAKE 1 TABLET BY MOUTH ONCE A DAY ON DAY 2 THROUGH DAY 5     benzonatate  (TESSALON ) 100 MG capsule TAKE 1 CAPSULE BY MOUTH THREE TIMES DAILY AS NEEDED FOR COUGH DO NOT TAKE WITH ALCOHOL OR WHILE DRIVING OR OPERATING HEAVY MACHINERY. MAY CAUSE DROWSINESS     cephalexin  (KEFLEX ) 500 MG capsule Take 500 mg by mouth 3 (three) times daily     ciclopirox  (PENLAC ) 8 % topical nail solution APPLY TOPICALLY AT BEDTIME. APPLY OVER NAIL AND SURROUNDING SKIN. APPLY DAILY OVER PREVIOUS COAT. REMOVE WEEKLY WITH POLISH REMOVER     cloNIDine  HCL (CATAPRES ) 0.2 MG tablet Take 0.2 mg by mouth 2 (two) times daily     clotrimazole -betamethasone  (LOTRISONE ) 1-0.05 % cream      ELIQUIS  2.5 mg tablet Take 2.5 mg by mouth 2 (two) times daily     HYDROcodone -acetaminophen  (NORCO) 5-325 mg tablet TAKE 1 TABLET BY MOUTH EVERY 8 HOURS AS NEEDED FOR PAIN FOR UP TO 5 DAYS. MAX DAILY AMOUNT 3 TABLETS.     hydrocortisone   2.5 % cream      hydrOXYzine  (ATARAX ) 50 MG tablet TAKE 1 TO 2 TABLETS BY MOUTH FOR SLEEP FOR 30 DAYS     losartan (COZAAR) 25 MG tablet Take 25 mg by mouth once daily     mupirocin  (BACTROBAN ) 2 % ointment APPLY TO EACH NOSTRIL TWICE DAILY FOR 5 DAYS     ondansetron  (ZOFRAN ) 4 MG tablet TAKE 1 TABLET BY MOUTH EVERY 4 HOURS AS NEEDED FOR NAUSEA FOR VOMITING     predniSONE  (DELTASONE ) 20 MG tablet TAKE 2 TABLETS BY MOUTH ONCE DAILY WITH BREAKFAST FOR 5 DAYS     No current facility-administered medications on file prior to visit.    Family History  Problem Relation Age of Onset   Colon cancer Father    Hyperlipidemia (Elevated cholesterol) Brother    Coronary Artery Disease (Blocked arteries around heart) Brother     Diabetes Brother      Social History   Tobacco Use  Smoking Status Every Day   Current packs/day: 1.00   Types: Cigarettes  Smokeless Tobacco Never     Social History   Socioeconomic History   Marital status: Married  Tobacco Use   Smoking status: Every Day    Current packs/day: 1.00    Types: Cigarettes   Smokeless tobacco: Never  Substance and Sexual Activity   Alcohol use: Not Currently   Drug use: Yes    Types: Marijuana   Social Drivers of Health   Financial Resource Strain: Low Risk  (09/17/2023)   Received from [facility]   Overall Financial Resource Strain (CARDIA)    Difficulty of Paying Living Expenses: Not hard at all  Food Insecurity: Food Insecurity Present (12/14/2023)   Received from [facility]   Hunger Vital Sign    Worried About Running Out of Food in the Last Year: Sometimes true    Ran Out of Food in the Last Year: Sometimes true  Transportation Needs: Unknown (12/14/2023)   Received from [facility] - Transportation    Lack of Transportation (Medical): No  Stress: No Stress Concern Present (10/18/2023)   Received from [facility] - Occupational Stress Questionnaire    Feeling of Stress : Not at all  Housing Stability: High Risk (12/14/2023)   Received from [facility]   Housing Stability Vital Sign    Unable to Pay for Housing in the Last Year: No    Homeless in the Last Year: Yes    Objective:    Vitals:   01/04/24 0937  BP: 118/71  Pulse: 82  Temp: 36.7 C (98 F)  Weight: 59.9 kg (132 lb)  Height: 177.8 cm (5\' 10" )     Exam Gen: NAD Abd: soft Rectal: R anterior mass involving sphincter complex and extending into ischiorectal space   Labs, Imaging and Diagnostic Testing: PET scan performed 12/24/2023 shows no sign of metastatic disease  Assessment and Plan:  Diagnoses and all orders for this visit:  Mass of anus    63 year old male smoker with significant vascular  disease who presents to the office today for evaluation of a recurrent anal mass.  He is status post treatment of an anal cancer in 2013.  On exam he has a large mass involving the sphincter complex in the right anterior perianal region.  PET scan is negative for any signs of metastatic disease.  I have recommended exam under anesthesia and biopsy.  I also recommended MRI of  the pelvis for evaluation of surgical margins.  I believe he would be best served at a tertiary care center for surgical evaluation and reconstruction due to his co-morbidities and need for skilled plastic surgery reconstruction.  Fernande Howells, MD Colon and Rectal Surgery [facility]

## 2024-01-04 NOTE — H&P (View-Only) (Signed)
 REFERRING PHYSICIAN:  Jeanann Midget,*  PROVIDER:  Denese Finn, MD  MRN: [id number] DOB: [birth year] DATE OF ENCOUNTER: 01/04/2024  Subjective   Chief Complaint: New Consultation ( Anal cancer, )     History of Present Illness: Martin [name] is a 63 y.o. male who is seen today as an office consultation at the request of Dr. Scherrie Curt for evaluation of New Consultation ( Anal cancer, ) .  Patient is status post transanal excision, chemotherapy and radiation for anal SCC in 2013.  He had a complete response to therapy and continued to have negative examinations by radiation oncology.  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  He states that he began to have pain in his anal region in February 2025.  He noticed bleeding with bowel movements as well. No biopsy has been performed. He also underwent aortic valve replacement in February 2025.  Past medical history also includes COPD and peripheral vascular disease.   Review of Systems: A complete review of systems was obtained from the patient.  I have reviewed this information and discussed as appropriate with the patient.  See HPI as well for other ROS.    Medical History: Past Medical History:  Diagnosis Date   Anal cancer (CMS/HHS-HCC)    Anxiety    Arthritis    COPD (chronic obstructive pulmonary disease) (CMS/HHS-HCC)    GERD (gastroesophageal reflux disease)    Heart valve disease    History of stroke    Hyperlipidemia    Hypertension    Substance abuse (CMS/HHS-HCC)     There is no problem list on file for this patient.   Past Surgical History:  Procedure Laterality Date   HERNIA REPAIR  2013   Inguinal   RESECTION SMALL BOWEL  2018   SBO   APPENDECTOMY     VASCULAR SURGERY       Allergies  Allergen Reactions   Aspirin  Swelling    Lip swelling   Morphine  Itching    Pt stated little itching, not severe    Codeine Nausea    Current Outpatient Medications on File Prior to Visit  Medication Sig Dispense Refill   amoxicillin -clavulanate (AUGMENTIN ) 875-125 mg tablet Take 1 tablet by mouth every 12 (twelve) hours     azithromycin  (ZITHROMAX ) 250 MG tablet TAKE 2 TABLETS BY MOUTH ON DAY 1, AND THEN TAKE 1 TABLET BY MOUTH ONCE A DAY ON DAY 2 THROUGH DAY 5     benzonatate  (TESSALON ) 100 MG capsule TAKE 1 CAPSULE BY MOUTH THREE TIMES DAILY AS NEEDED FOR COUGH DO NOT TAKE WITH ALCOHOL OR WHILE DRIVING OR OPERATING HEAVY MACHINERY. MAY CAUSE DROWSINESS     cephalexin  (KEFLEX ) 500 MG capsule Take 500 mg by mouth 3 (three) times daily     ciclopirox  (PENLAC ) 8 % topical nail solution APPLY TOPICALLY AT BEDTIME. APPLY OVER NAIL AND SURROUNDING SKIN. APPLY DAILY OVER PREVIOUS COAT. REMOVE WEEKLY WITH POLISH REMOVER     cloNIDine  HCL (CATAPRES ) 0.2 MG tablet Take 0.2 mg by mouth 2 (two) times daily     clotrimazole -betamethasone  (LOTRISONE ) 1-0.05 % cream      ELIQUIS  2.5 mg tablet Take 2.5 mg by mouth 2 (two) times daily     HYDROcodone -acetaminophen  (NORCO) 5-325 mg tablet TAKE 1 TABLET BY MOUTH EVERY 8 HOURS AS NEEDED FOR PAIN FOR UP TO 5 DAYS. MAX DAILY AMOUNT 3 TABLETS.     hydrocortisone   2.5 % cream      hydrOXYzine  (ATARAX ) 50 MG tablet TAKE 1 TO 2 TABLETS BY MOUTH FOR SLEEP FOR 30 DAYS     losartan (COZAAR) 25 MG tablet Take 25 mg by mouth once daily     mupirocin  (BACTROBAN ) 2 % ointment APPLY TO EACH NOSTRIL TWICE DAILY FOR 5 DAYS     ondansetron  (ZOFRAN ) 4 MG tablet TAKE 1 TABLET BY MOUTH EVERY 4 HOURS AS NEEDED FOR NAUSEA FOR VOMITING     predniSONE  (DELTASONE ) 20 MG tablet TAKE 2 TABLETS BY MOUTH ONCE DAILY WITH BREAKFAST FOR 5 DAYS     No current facility-administered medications on file prior to visit.    Family History  Problem Relation Age of Onset   Colon cancer Father    Hyperlipidemia (Elevated cholesterol) Brother    Coronary Artery Disease (Blocked arteries around heart) Brother     Diabetes Brother      Social History   Tobacco Use  Smoking Status Every Day   Current packs/day: 1.00   Types: Cigarettes  Smokeless Tobacco Never     Social History   Socioeconomic History   Marital status: Married  Tobacco Use   Smoking status: Every Day    Current packs/day: 1.00    Types: Cigarettes   Smokeless tobacco: Never  Substance and Sexual Activity   Alcohol use: Not Currently   Drug use: Yes    Types: Marijuana   Social Drivers of Health   Financial Resource Strain: Low Risk  (09/17/2023)   Received from [facility]   Overall Financial Resource Strain (CARDIA)    Difficulty of Paying Living Expenses: Not hard at all  Food Insecurity: Food Insecurity Present (12/14/2023)   Received from [facility]   Hunger Vital Sign    Worried About Running Out of Food in the Last Year: Sometimes true    Ran Out of Food in the Last Year: Sometimes true  Transportation Needs: Unknown (12/14/2023)   Received from [facility] - Transportation    Lack of Transportation (Medical): No  Stress: No Stress Concern Present (10/18/2023)   Received from [facility] - Occupational Stress Questionnaire    Feeling of Stress : Not at all  Housing Stability: High Risk (12/14/2023)   Received from [facility]   Housing Stability Vital Sign    Unable to Pay for Housing in the Last Year: No    Homeless in the Last Year: Yes    Objective:    Vitals:   01/04/24 0937  BP: 118/71  Pulse: 82  Temp: 36.7 C (98 F)  Weight: 59.9 kg (132 lb)  Height: 177.8 cm (5\' 10" )     Exam Gen: NAD Abd: soft Rectal: R anterior mass involving sphincter complex and extending into ischiorectal space   Labs, Imaging and Diagnostic Testing: PET scan performed 12/24/2023 shows no sign of metastatic disease  Assessment and Plan:  Diagnoses and all orders for this visit:  Mass of anus    63 year old male smoker with significant vascular  disease who presents to the office today for evaluation of a recurrent anal mass.  He is status post treatment of an anal cancer in 2013.  On exam he has a large mass involving the sphincter complex in the right anterior perianal region.  PET scan is negative for any signs of metastatic disease.  I have recommended exam under anesthesia and biopsy.  I also recommended MRI of  the pelvis for evaluation of surgical margins.  I believe he would be best served at a tertiary care center for surgical evaluation and reconstruction due to his co-morbidities and need for skilled plastic surgery reconstruction.  Fernande Howells, MD Colon and Rectal Surgery [facility]

## 2024-01-05 ENCOUNTER — Inpatient Hospital Stay (HOSPITAL_BASED_OUTPATIENT_CLINIC_OR_DEPARTMENT_OTHER): Admitting: Oncology

## 2024-01-05 VITALS — BP 117/78 | HR 64 | Temp 98.1°F | Resp 20 | Wt 131.4 lb

## 2024-01-05 DIAGNOSIS — Z8673 Personal history of transient ischemic attack (TIA), and cerebral infarction without residual deficits: Secondary | ICD-10-CM | POA: Diagnosis not present

## 2024-01-05 DIAGNOSIS — E785 Hyperlipidemia, unspecified: Secondary | ICD-10-CM | POA: Diagnosis not present

## 2024-01-05 DIAGNOSIS — Z9221 Personal history of antineoplastic chemotherapy: Secondary | ICD-10-CM | POA: Diagnosis not present

## 2024-01-05 DIAGNOSIS — C210 Malignant neoplasm of anus, unspecified: Secondary | ICD-10-CM

## 2024-01-05 DIAGNOSIS — I739 Peripheral vascular disease, unspecified: Secondary | ICD-10-CM | POA: Diagnosis not present

## 2024-01-05 DIAGNOSIS — K625 Hemorrhage of anus and rectum: Secondary | ICD-10-CM | POA: Diagnosis not present

## 2024-01-05 DIAGNOSIS — G893 Neoplasm related pain (acute) (chronic): Secondary | ICD-10-CM | POA: Diagnosis not present

## 2024-01-05 DIAGNOSIS — K219 Gastro-esophageal reflux disease without esophagitis: Secondary | ICD-10-CM | POA: Diagnosis not present

## 2024-01-05 DIAGNOSIS — F17210 Nicotine dependence, cigarettes, uncomplicated: Secondary | ICD-10-CM | POA: Diagnosis not present

## 2024-01-05 DIAGNOSIS — J449 Chronic obstructive pulmonary disease, unspecified: Secondary | ICD-10-CM | POA: Diagnosis not present

## 2024-01-05 DIAGNOSIS — K56609 Unspecified intestinal obstruction, unspecified as to partial versus complete obstruction: Secondary | ICD-10-CM | POA: Diagnosis not present

## 2024-01-05 DIAGNOSIS — I10 Essential (primary) hypertension: Secondary | ICD-10-CM | POA: Diagnosis not present

## 2024-01-05 DIAGNOSIS — F1120 Opioid dependence, uncomplicated: Secondary | ICD-10-CM | POA: Diagnosis not present

## 2024-01-05 DIAGNOSIS — Z923 Personal history of irradiation: Secondary | ICD-10-CM | POA: Diagnosis not present

## 2024-01-05 DIAGNOSIS — K449 Diaphragmatic hernia without obstruction or gangrene: Secondary | ICD-10-CM | POA: Diagnosis not present

## 2024-01-05 DIAGNOSIS — C20 Malignant neoplasm of rectum: Secondary | ICD-10-CM | POA: Diagnosis present

## 2024-01-05 NOTE — Progress Notes (Signed)
  [facility] OFFICE PROGRESS NOTE   Diagnosis: Anal cancer  INTERVAL HISTORY:   Martin [name] returns as scheduled.  He saw Dr. Andy [name] on 01/04/2024.  She recommends an examination under anesthesia with a biopsy.  She also recommends a pelvic MRI.  He continues to have rectal pain.  He takes oxycodone  twice daily.  He reports rectal drainage.  He has a productive cough.  He continues smoking.  Objective:  Vital signs in last 24 hours:  Blood pressure 117/78, pulse 64, temperature 98.1 F (36.7 C), temperature source Temporal, resp. rate 20, weight 131 lb 6.4 oz (59.6 kg), SpO2 100%.    Resp: Bilateral end inspiratory/expiratory wheeze/rhonchi, no respiratory distress Cardio: Regular rate and rhythm GI: No hepatosplenomegaly Vascular: No leg edema Rectal: Ulceration and thickening surrounding the anal verge/margin   Lab Results:  Lab Results  Component Value Date   WBC 8.9 04/30/2022   HGB 9.7 (L) 04/30/2022   HCT 30.8 (L) 04/30/2022   MCV 90.9 04/30/2022   PLT 153 04/30/2022   NEUTROABS 8.9 (H) 04/28/2022    CMP  Lab Results  Component Value Date   NA 140 04/30/2022   K 4.9 04/30/2022   CL 110 04/30/2022   CO2 25 04/30/2022   GLUCOSE 100 (H) 04/30/2022   BUN 11 11/04/2022   CREATININE 0.78 11/04/2022   CALCIUM  9.1 04/30/2022   PROT 6.9 04/28/2022   ALBUMIN 3.6 04/28/2022   AST 19 04/28/2022   ALT 17 04/28/2022   ALKPHOS 93 04/28/2022   BILITOT <0.1 (L) 04/28/2022   GFRNONAA >60 11/04/2022   GFRAA >60 03/10/2020     Medications: I have reviewed the patient's current medications.   Assessment/Plan: Anal cancer Transanal excision of anal mass 10/14/2011, squamous cell carcinoma with positive surgical margins Radiation 11/19/2011 - 01/08/2012 Cycle one 5-FU/Mitomycin -C 07/2012, cycle two 5-FU/Mitomycin -C 12/15/2011 CT abdomen/pelvis 07/11/2023: Poorly defined hyperenhancing soft tissue at the anus, no pathology enlarged lymph nodes PET 12/22/2023:  Diffuse nodular thickening with abnormal tracer uptake at the anal canal, right side and anteriorly, no evidence of metastatic disease, post surgical uptake in the sternum Aortic valve replacement 10/14/2023 COPD Narcotic addiction-followed in a pain clinic Rectal bleeding and pain secondary to #1 6.   Peripheral vascular disease 7.   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   MSSA bacteremia February 2023     Disposition: Martin [name] has a remote history of anal cancer.  He has clinical evidence of locally recurrent disease involving the anal canal/anal margin.  I reviewed findings and images with him.  He appears to have locally recurrent disease.  He saw Dr. Andy [name].  He is scheduled undergo a diagnostic biopsy and surgical planning.  He will return for an office visit after the diagnostic biopsy to discuss treatment options.  We will consider referring him to an academic center for surgery.  We discussed systemic treatment options.  He understands systemic therapy is palliative.  He will continue oxycodone  for pain.  He is scheduled for a diagnostic biopsy 01/14/2024.  He will return for an office visit 01/18/2024.  Martin Deep, MD  01/05/2024  11:49 AM

## 2024-01-10 NOTE — Progress Notes (Signed)
 COVID Vaccine received:  [x]  No []  Yes Date of any COVID positive Test in last 90 days:  none  PCP - Hershell Lose, NP Cardiologist - Martyn Sleigh, MD at [facility] [phone] (Work) [phone] (Fax)  Oncology- Coni Deep, MD  Pain Mgmt -   [facility] for Methadone  Cardiothoracic- Gae Jointer, MD at [place] [phone] (Work) [phone] (Fax)   Chest x-ray - 12-05-2023  2v  Epic EKG - 12-05-2023  Epic  Stress Test -  ECHO - [facility]EE 10-16-2021  Cardiac Cath - 09-22-2023  [facility] preop for AVR done at [facility] Holter monitor- 12-02-23  CEW  Bowel Prep - [x]  No  []   Yes ______  Pacemaker / ICD device [x]  No []  Yes   Spinal Cord Stimulator:[x]  No []  Yes       History of Sleep Apnea? [x]  No []  Yes   CPAP used?- [x]  No []  Yes    Does the patient monitor blood sugar?   [x]  N/A   []  No []  Yes  Patient has: [x]  NO Hx DM   []  Pre-DM   []  DM1  []   DM2 Last A1c was:4.8 on  10-13-2023     Blood Thinner / Instructions:  Plavix  Per Dr. Andy Bannister do not hold. Verified with Alphonse Asal.  Aspirin  Instructions:  none  ERAS Protocol Ordered: [x]  No  []  Yes Patient is to be NPO after: MN  per orders  Dental hx: [x]  Dentures: has dentures but doesn't wear them []  N/A      []  Bridge or Partial:                   []  Loose or Damaged teeth:   Comments: 23 mm Edwards Inspiris Resilia bovine pericardial tissue valve and aortic root patch enlargement using Bovine Pericardial patch by Dr. Borchelt (10/14/23).   Activity level: Patient is able to climb a flight of stairs without difficulty; [x]  No CP   but would have _SOB.   Patient can perform ADLs without assistance.   Anesthesia review: s/p AVR 10-14-23 [facility]. HTN, CHF, PVD, GERD, GAD, CPS, hx IVDU (Cocaine) now on Methadone , COPD continues to smoke 1/2 ppd  Patient denies shortness of breath, fever, cough and chest pain at PAT appointment.  Patient verbalized understanding and agreement to the Pre-Surgical Instructions that were given to them at this PAT  appointment. Patient was also educated of the need to review these PAT instructions again prior to his surgery.I reviewed the appropriate phone numbers to call if they have any and questions or concerns.

## 2024-01-10 NOTE — Patient Instructions (Signed)
 SURGICAL WAITING ROOM VISITATION Patients having surgery or a procedure may have no more than 2 support people in the waiting area - these visitors may rotate in the visitor waiting room.   If the patient needs to stay at the hospital during part of their recovery, the visitor guidelines for inpatient rooms apply.  PRE-OP VISITATION  Pre-op nurse will coordinate an appropriate time for 1 support person to accompany the patient in pre-op.  This support person may not rotate.  This visitor will be contacted when the time is appropriate for the visitor to come back in the pre-op area.  Please refer to the [facility] website for the visitor guidelines for Inpatients (after your surgery is over and you are in a regular room).  You are not required to quarantine at this time prior to your surgery. However, you must do this: Hand Hygiene often Do NOT share personal items Notify your provider if you are in close contact with someone who has COVID or you develop fever 100.4 or greater, new onset of sneezing, cough, sore throat, shortness of breath or body aches.  If you test positive for Covid or have been in contact with anyone that has tested positive in the last 10 days please notify you surgeon.    Your procedure is scheduled on:  Thursday  Jan 14, 2024  Report to [facility] Main Entrance: [address] entrance where the [employer] is available.   Report to admitting at:  08:15   AM  Call this number if you have any questions or problems the morning of surgery [phone]  DO NOT EAT OR DRINK ANYTHING AFTER MIDNIGHT THE NIGHT PRIOR TO YOUR SURGERY / PROCEDURE.   FOLLOW  ANY ADDITIONAL PRE OP INSTRUCTIONS YOU RECEIVED FROM YOUR SURGEON'S OFFICE!!!   Oral Hygiene is also important to reduce your risk of infection.        Remember - BRUSH YOUR TEETH THE MORNING OF SURGERY WITH YOUR REGULAR TOOTHPASTE  Do NOT smoke after Midnight the night before surgery.  STOP TAKING all Vitamins,  Herbs and supplements 1 week before your surgery.   Take ONLY these medicines the morning of surgery with A SIP OF WATER: tamsulosin , carvedilol, buspirone. You may take EITHER Tylenol  OR Oxycodone  if needed for pain.  You may use your inhalers (Please bring your Albuterol  inhaler with you on the day of surgery)                  You may not have any metal on your body including jewelry, and body piercing  Do not wear  lotions, powders, cologne, or deodorant  en may shave face and neck.  Contacts, Hearing Aids, dentures or bridgework may not be worn into surgery. DENTURES WILL BE REMOVED PRIOR TO SURGERY PLEASE DO NOT APPLY "Poly grip" OR ADHESIVES!!!  Patients discharged on the day of surgery will not be allowed to drive home.  Someone NEEDS to stay with you for the first 24 hours after anesthesia.  Do not bring your home medications ( other than Albuterol  ) nto the hospital. The Pharmacy will dispense medications listed on your medication list to you during your admission in the Hospital.   Please read over the following fact sheets you were given: IF YOU HAVE QUESTIONS ABOUT YOUR PRE-OP INSTRUCTIONS, PLEASE CALL [phone].   [place] - Preparing for Surgery Before surgery, you can play an important role.  Because skin is not sterile, your skin needs to be as  free of germs as possible.  You can reduce the number of germs on your skin by washing with CHG (chlorahexidine gluconate) soap before surgery.  CHG is an antiseptic cleaner which kills germs and bonds with the skin to continue killing germs even after washing. Please DO NOT use if you have an allergy to CHG or antibacterial soaps.  If your skin becomes reddened/irritated stop using the CHG and inform your nurse when you arrive at Short Stay. Do not shave (including legs and underarms) for at least 48 hours prior to the first CHG shower.  You may shave your face/neck.  Please follow these instructions carefully:  1.  Shower  with CHG Soap the night before surgery and the  morning of surgery.  2.  If you choose to wash your hair, wash your hair first as usual with your normal  shampoo.  3.  After you shampoo, rinse your hair and body thoroughly to remove the shampoo.                             4.  Use CHG as you would any other liquid soap.  You can apply chg directly to the skin and wash.  Gently with a scrungie or clean washcloth.  5.  Apply the CHG Soap to your body ONLY FROM THE NECK DOWN.   Do not use on face/ open                           Wound or open sores. Avoid contact with eyes, ears mouth and genitals (private parts).                       Wash face,  Genitals (private parts) with your normal soap.             6.  Wash thoroughly, paying special attention to the area where your  surgery  will be performed.  7.  Thoroughly rinse your body with warm water from the neck down.  8.  DO NOT shower/wash with your normal soap after using and rinsing off the CHG Soap.            9.  Pat yourself dry with a clean towel.            10.  Wear clean pajamas.            11.  Place clean sheets on your bed the night of your first shower and do not  sleep with pets.  ON THE DAY OF SURGERY : Do not apply any lotions/deodorants the morning of surgery.  Please wear clean clothes to the hospital/surgery center.    FAILURE TO FOLLOW THESE INSTRUCTIONS MAY RESULT IN THE CANCELLATION OF YOUR SURGERY  PATIENT SIGNATURE_________________________________  NURSE SIGNATURE__________________________________  ________________________________________________________________________

## 2024-01-11 ENCOUNTER — Encounter (HOSPITAL_COMMUNITY): Payer: Self-pay

## 2024-01-11 ENCOUNTER — Telehealth: Payer: Self-pay

## 2024-01-11 ENCOUNTER — Encounter (HOSPITAL_COMMUNITY)
Admission: RE | Admit: 2024-01-11 | Discharge: 2024-01-11 | Disposition: A | Discharge status: Home / Self Care | Source: Ambulatory Visit | Attending: General Surgery | Admitting: General Surgery

## 2024-01-11 ENCOUNTER — Other Ambulatory Visit: Payer: Self-pay

## 2024-01-11 VITALS — BP 105/61 | HR 74 | Temp 98.7°F | Resp 14 | Ht 71.0 in | Wt 129.0 lb

## 2024-01-11 DIAGNOSIS — J449 Chronic obstructive pulmonary disease, unspecified: Secondary | ICD-10-CM | POA: Insufficient documentation

## 2024-01-11 DIAGNOSIS — Z01812 Encounter for preprocedural laboratory examination: Secondary | ICD-10-CM | POA: Insufficient documentation

## 2024-01-11 DIAGNOSIS — Z85048 Personal history of other malignant neoplasm of rectum, rectosigmoid junction, and anus: Secondary | ICD-10-CM | POA: Insufficient documentation

## 2024-01-11 DIAGNOSIS — I110 Hypertensive heart disease with heart failure: Secondary | ICD-10-CM | POA: Diagnosis not present

## 2024-01-11 DIAGNOSIS — Z952 Presence of prosthetic heart valve: Secondary | ICD-10-CM | POA: Diagnosis not present

## 2024-01-11 DIAGNOSIS — Z7902 Long term (current) use of antithrombotics/antiplatelets: Secondary | ICD-10-CM | POA: Diagnosis not present

## 2024-01-11 DIAGNOSIS — I739 Peripheral vascular disease, unspecified: Secondary | ICD-10-CM | POA: Diagnosis not present

## 2024-01-11 DIAGNOSIS — I2510 Atherosclerotic heart disease of native coronary artery without angina pectoris: Secondary | ICD-10-CM | POA: Diagnosis not present

## 2024-01-11 DIAGNOSIS — I509 Heart failure, unspecified: Secondary | ICD-10-CM | POA: Insufficient documentation

## 2024-01-11 DIAGNOSIS — Z79891 Long term (current) use of opiate analgesic: Secondary | ICD-10-CM

## 2024-01-11 DIAGNOSIS — Z9889 Other specified postprocedural states: Secondary | ICD-10-CM | POA: Insufficient documentation

## 2024-01-11 DIAGNOSIS — F17200 Nicotine dependence, unspecified, uncomplicated: Secondary | ICD-10-CM | POA: Insufficient documentation

## 2024-01-11 DIAGNOSIS — Z8614 Personal history of Methicillin resistant Staphylococcus aureus infection: Secondary | ICD-10-CM | POA: Diagnosis not present

## 2024-01-11 DIAGNOSIS — Z8673 Personal history of transient ischemic attack (TIA), and cerebral infarction without residual deficits: Secondary | ICD-10-CM | POA: Diagnosis not present

## 2024-01-11 DIAGNOSIS — K6289 Other specified diseases of anus and rectum: Secondary | ICD-10-CM | POA: Diagnosis not present

## 2024-01-11 DIAGNOSIS — Z923 Personal history of irradiation: Secondary | ICD-10-CM | POA: Insufficient documentation

## 2024-01-11 DIAGNOSIS — Z79899 Other long term (current) drug therapy: Secondary | ICD-10-CM | POA: Diagnosis not present

## 2024-01-11 DIAGNOSIS — Z01818 Encounter for other preprocedural examination: Secondary | ICD-10-CM

## 2024-01-11 LAB — COMPREHENSIVE METABOLIC PANEL WITH GFR
ALT: 18 U/L (ref 0–44)
AST: 23 U/L (ref 15–41)
Albumin: 3.5 g/dL (ref 3.5–5.0)
Alkaline Phosphatase: 134 U/L — ABNORMAL HIGH (ref 38–126)
Anion gap: 6 (ref 5–15)
BUN: 8 mg/dL (ref 8–23)
CO2: 24 mmol/L (ref 22–32)
Calcium: 9 mg/dL (ref 8.9–10.3)
Chloride: 104 mmol/L (ref 98–111)
Creatinine, Ser: 0.69 mg/dL (ref 0.61–1.24)
GFR, Estimated: >60 mL/min (ref >60)
Glucose, Bld: 106 mg/dL — ABNORMAL HIGH (ref 70–99)
Potassium: 4.1 mmol/L (ref 3.5–5.1)
Sodium: 134 mmol/L — ABNORMAL LOW (ref 135–145)
Total Bilirubin: 0.8 mg/dL (ref 0.0–1.2)
Total Protein: 6.8 g/dL (ref 6.5–8.1)

## 2024-01-11 LAB — CBC
HCT: 37.1 % — ABNORMAL LOW (ref 39.0–52.0)
Hemoglobin: 11.8 g/dL — ABNORMAL LOW (ref 13.0–17.0)
MCH: 27.6 pg (ref 26.0–34.0)
MCHC: 31.8 g/dL (ref 30.0–36.0)
MCV: 86.9 fL (ref 80.0–100.0)
Platelets: 168 10*3/uL (ref 150–400)
RBC: 4.27 MIL/uL (ref 4.22–5.81)
RDW: 14.7 % (ref 11.5–15.5)
WBC: 10.7 10*3/uL — ABNORMAL HIGH (ref 4.0–10.5)
nRBC: 0 % (ref 0.0–0.2)

## 2024-01-11 NOTE — Telephone Encounter (Signed)
 Patient request a refill of his Oxycodone , I placed the request on Np's desk.

## 2024-01-12 ENCOUNTER — Other Ambulatory Visit: Payer: Self-pay | Admitting: *Deleted

## 2024-01-12 ENCOUNTER — Other Ambulatory Visit: Payer: Self-pay | Admitting: Nurse Practitioner

## 2024-01-12 DIAGNOSIS — C210 Malignant neoplasm of anus, unspecified: Secondary | ICD-10-CM

## 2024-01-12 LAB — SURGICAL PCR SCREEN
MRSA, PCR: POSITIVE — AB
Staphylococcus aureus: POSITIVE — AB

## 2024-01-12 MED ORDER — OXYCODONE HCL 5 MG PO TABS: 5.0000 mg | ORAL_TABLET | ORAL | 0 refills | Status: DC | PRN

## 2024-01-12 NOTE — Telephone Encounter (Signed)
 LVM that he is out of pain medication and requesting refill. MD notified.

## 2024-01-12 NOTE — Progress Notes (Signed)
 Anesthesia Chart Review   Case: [id number] Date/Time: 01/14/24 1015   Procedures:      BIOPSY, RECTUM     EXAM UNDER ANESTHESIA, RECTUM   Anesthesia type: Monitor Anesthesia Care   Diagnosis: Mass in rectum [K62.89]   Pre-op diagnosis: ANAL MASS   [facility] ROOM 02 / [facility]   Surgeons: Joyce Nixon, MD       DISCUSSION:62 y.o. 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 Joyce Nixon.   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 Also prescribed oxycodone  5mg  1-2 tablets every 4 hours as needed.   Pt will remain on Plavix  for procedure per Dr. Andy Bannister.   Pt last seen by cardiology 12/23/2023. Per OV note pt feeling poorly with fatigue and shortness of breath, medication changes made.  Started on carvedilol and losartan, clonidine  reduced. Echo scheduled in a few weeks from this visit to evaluate recent AV replacement.  Prior EF with LVEF 40-45%.  Will request cardio clearance due to recent AV replacement, fatigue and shortness of breath at recent cardio visit with medication changes.  VS: BP 105/61 Comment: right arm sitting  Pulse 74   Temp 37.1 C (Oral)   Resp 14   Ht 5\' 11"  (1.803 m)   Wt 58.5 kg   SpO2 99%   BMI 17.99 kg/m   PROVIDERS: Hershell Lose, NP is PCP    LABS: Labs reviewed: Acceptable for surgery. (all labs ordered are listed, but only abnormal results are displayed)  Labs Reviewed  SURGICAL PCR SCREEN - Abnormal; Notable for the following components:      Result Value   MRSA, PCR POSITIVE (*)    Staphylococcus aureus POSITIVE (*)    All other components within normal limits  COMPREHENSIVE METABOLIC PANEL WITH GFR - Abnormal; Notable for the following components:   Sodium 134 (*)    Glucose, Bld 106 (*)    Alkaline Phosphatase 134 (*)    All other components within normal limits  CBC - Abnormal; Notable for the following  components:   WBC 10.7 (*)    Hemoglobin 11.8 (*)    HCT 37.1 (*)    All other components within normal limits     IMAGES:   EKG:   CV:  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    Anxiety    Arthritis    DDD lumbar, arthritis knees   Complication of anesthesia    pt states he woke up during a vascular procedure   COPD (chronic obstructive pulmonary disease) (HCC)    DJD (degenerative joint disease) of lumbar spine    GERD (gastroesophageal reflux disease)    Hemorrhoid    internal   History of bowel resection 04/19/2017   Hyperlipidemia    Hypertension    Pt states he's never been told or treated for HTN   Illicit drug use    + UDS cocaine, THC, opiates 10/11/21   Inguinal hernia    MSSA bacteremia 10/11/2021   s/p IV cefazolin , po Linezolid ; 10/16/21 TEE w/o vegetations.   Peripheral vascular disease (HCC)    Pneumonia    as child, cough at present with no fever   Radiation 11/19/11-01/08/12   5040 cGy 28 fx Pelvis and inguinal area   Stroke (HCC)  TIA,  no weakness or paralysis   Substance abuse (HCC)    last use was Jan. 2024; patient states he has "been clean" since then    Past Surgical History:  Procedure Laterality Date   APPENDECTOMY     age 37 or 65   CARPAL TUNNEL RELEASE Right 02/04/2023   Procedure: RIGHT CARPAL TUNNEL RELEASE;  Surgeon: Wes Hamman, MD;  Location: [facility];  Service: Orthopedics;  Laterality: Right;   EXAMINATION UNDER ANESTHESIA  10/14/2011   Procedure: EXAM UNDER ANESTHESIA;  Surgeon: Keitha Pata, MD;  [facility];  Service: General;  Laterality: N/A;  exam under anethesia, Excision of mass anal canal, 1.5cm   INGUINAL HERNIA REPAIR  05/11/2012   Procedure: HERNIA REPAIR INGUINAL ADULT;  Surgeon: Keitha Pata, MD;  Location: [facility];  Service: General;  Laterality: Left;  left inguinal hernia  repair with mesh   LOWER EXTREMITY ANGIOGRAPHY Left 07/02/2021   Procedure: LOWER EXTREMITY ANGIOGRAPHY;  Surgeon: Jackquelyn Mass, MD;  Location: [facility];  Service: Cardiovascular;  Laterality: Left;   LOWER EXTREMITY ANGIOGRAPHY Right 07/16/2021   Procedure: LOWER EXTREMITY ANGIOGRAPHY;  Surgeon: Jackquelyn Mass, MD;  Location: [facility];  Service: Cardiovascular;  Laterality: Right;   LOWER EXTREMITY ANGIOGRAPHY Left 08/27/2021   Procedure: LOWER EXTREMITY ANGIOGRAPHY;  Surgeon: Jackquelyn Mass, MD;  Location: [facility];  Service: Cardiovascular;  Laterality: Left;   LOWER EXTREMITY ANGIOGRAPHY Left 01/14/2022   Procedure: Lower Extremity Angiography;  Surgeon: Jackquelyn Mass, MD;  Location: [facility];  Service: Cardiovascular;  Laterality: Left;   LOWER EXTREMITY ANGIOGRAPHY Left 11/04/2022   Procedure: Lower Extremity Angiography;  Surgeon: Jackquelyn Mass, MD;  Location: [facility];  Service: Cardiovascular;  Laterality: Left;   SMALL INTESTINE SURGERY  04/19/2017   exploratory lap, partial small bowel resection for perforated ulcer/abcess, radiation enteritis   surgical pathology   10/14/2011   squamous cell ca of anus   TEE WITHOUT CARDIOVERSION N/A 10/16/2021   Procedure: TRANSESOPHAGEAL ECHOCARDIOGRAM (TEE);  Surgeon: Hazle Lites, MD;  Location: [facility];  Service: Cardiovascular;  Laterality: N/A;   TOOTH EXTRACTION N/A 12/04/2021   Procedure: DENTAL RESTORATION/EXTRACTIONS;  Surgeon: Ascencion Lava, DMD;  Location: [facility];  Service: Oral Surgery;  Laterality: N/A;   transanal excision  10/14/2011   Dr.Todd Gerkin    MEDICATIONS:  acetaminophen  (TYLENOL ) 500 MG tablet   albuterol  (VENTOLIN  HFA) 108 (90 Base) MCG/ACT inhaler   atorvastatin  (LIPITOR) 10 MG tablet   atorvastatin  (LIPITOR) 40 MG tablet   azelastine  (ASTELIN ) 0.1 % nasal spray   busPIRone (BUSPAR) 5 MG tablet   carvedilol (COREG) 3.125 MG  tablet   cloNIDine  (CATAPRES ) 0.2 MG tablet   clopidogrel  (PLAVIX ) 75 MG tablet   docusate sodium  (COLACE) 100 MG capsule   EPINEPHrine  (EPIPEN  2-PAK) 0.3 mg/0.3 mL IJ SOAJ injection   losartan (COZAAR) 25 MG tablet   methadone  (DOLOPHINE ) 1 MG/1ML solution   mupirocin  cream (BACTROBAN ) 2 %   oxyCODONE  (OXY IR/ROXICODONE ) 5 MG immediate release tablet   oxymetazoline  (AFRIN) 0.05 % nasal spray   silver  sulfADIAZINE  (SILVADENE ) 1 % cream   SPIRIVA RESPIMAT 2.5 MCG/ACT AERS   tamsulosin  (FLOMAX ) 0.4 MG CAPS capsule   traZODone  (DESYREL ) 50 MG tablet   Vitamin D , Cholecalciferol, 25 MCG (1000 UT) TABS   No current facility-administered medications for this encounter.     Othal Kubitz Ward, PA-C  [facility] [phone]

## 2024-01-12 NOTE — Progress Notes (Signed)
 Patient's PCR screen is positive for Both MRSA AND STAPH. Appropriate notes have been placed on the patient's chart. This note has been routed to Dr. Andy Bannister and Alphonse Asal for review. The Patient's surgery is currently scheduled for: 01-14-2024  at [facility].  Morrie Arbour, BSN, CVRN-BC   Pre-Surgical Testing Nurse [facility]  [phone]

## 2024-01-13 NOTE — Anesthesia Preprocedure Evaluation (Signed)
 Anesthesia Evaluation  Patient identified by MRN, date of birth, ID band Patient awake    Reviewed: Allergy & Precautions, NPO status , Patient's Chart, lab work & pertinent test results, reviewed documented beta blocker date and time   Airway Mallampati: II  TM Distance: >3 FB Neck ROM: Full    Dental  (+) Edentulous Upper, Edentulous Lower, Dental Advisory Given   Pulmonary COPD,  COPD inhaler, Current SmokerPatient did not abstain from smoking.   Pulmonary exam normal breath sounds clear to auscultation       Cardiovascular hypertension, Pt. on home beta blockers and Pt. on medications + CAD, + Peripheral Vascular Disease and +CHF  Normal cardiovascular exam+ Valvular Problems/Murmurs (s/p AVR 01/2024)  Rhythm:Regular Rate:Normal  TEE 2023 1. Left ventricular ejection fraction, by estimation, is 55 to 60%. The  left ventricle has normal function. The left ventricle has no regional  wall motion abnormalities. There is mild left ventricular hypertrophy.   2. Right ventricular systolic function is normal. The right ventricular  size is normal.   3. Left atrial size was mildly dilated. No left atrial/left atrial  appendage thrombus was detected. The LAA emptying velocity was 62 cm/s.   4. The mitral valve is degenerative. Mild mitral valve regurgitation.   5. No vegetation. The aortic valve is tricuspid. Aortic valve  regurgitation moderate central. No aortic stenosis is present.     Neuro/Psych  PSYCHIATRIC DISORDERS Anxiety Depression    CVA, No Residual Symptoms    GI/Hepatic ,GERD  ,,(+)     substance abuse (methadone )  cocaine use and marijuana use  Endo/Other  negative endocrine ROS    Renal/GU negative Renal ROS  negative genitourinary   Musculoskeletal  (+) Arthritis ,  narcotic dependent  Abdominal   Peds  Hematology  (+) Blood dyscrasia (plavix )   Anesthesia Other Findings   Reproductive/Obstetrics                              Anesthesia Physical Anesthesia Plan  ASA: 3  Anesthesia Plan: MAC   Post-op Pain Management: Tylenol  PO (pre-op)*   Induction: Intravenous  PONV Risk Score and Plan: Propofol  infusion, Treatment may vary due to age or medical condition, Midazolam , Ondansetron  and Dexamethasone   Airway Management Planned: Natural Airway  Additional Equipment:   Intra-op Plan:   Post-operative Plan:   Informed Consent: I have reviewed the patients History and Physical, chart, labs and discussed the procedure including the risks, benefits and alternatives for the proposed anesthesia with the patient or authorized representative who has indicated his/her understanding and acceptance.     Dental advisory given  Plan Discussed with: CRNA  Anesthesia Plan Comments: (See PAT note 01/11/2024)       Anesthesia Quick Evaluation

## 2024-01-14 ENCOUNTER — Encounter (HOSPITAL_COMMUNITY)
Admission: RE | Disposition: A | Discharge status: Home / Self Care | Payer: Self-pay | Source: Ambulatory Visit | Attending: General Surgery

## 2024-01-14 ENCOUNTER — Other Ambulatory Visit: Payer: Self-pay

## 2024-01-14 ENCOUNTER — Ambulatory Visit (HOSPITAL_COMMUNITY): Payer: Self-pay | Admitting: Physician Assistant

## 2024-01-14 ENCOUNTER — Ambulatory Visit (HOSPITAL_COMMUNITY)
Admission: RE | Admit: 2024-01-14 | Discharge: 2024-01-14 | Disposition: A | Discharge status: Home / Self Care | Source: Ambulatory Visit | Attending: General Surgery | Admitting: General Surgery

## 2024-01-14 ENCOUNTER — Ambulatory Visit (HOSPITAL_BASED_OUTPATIENT_CLINIC_OR_DEPARTMENT_OTHER): Admitting: Anesthesiology

## 2024-01-14 ENCOUNTER — Encounter (HOSPITAL_COMMUNITY): Payer: Self-pay | Admitting: General Surgery

## 2024-01-14 DIAGNOSIS — C211 Malignant neoplasm of anal canal: Secondary | ICD-10-CM | POA: Insufficient documentation

## 2024-01-14 DIAGNOSIS — I10 Essential (primary) hypertension: Secondary | ICD-10-CM | POA: Diagnosis not present

## 2024-01-14 DIAGNOSIS — Z952 Presence of prosthetic heart valve: Secondary | ICD-10-CM | POA: Diagnosis not present

## 2024-01-14 DIAGNOSIS — F17210 Nicotine dependence, cigarettes, uncomplicated: Secondary | ICD-10-CM | POA: Insufficient documentation

## 2024-01-14 DIAGNOSIS — Z9221 Personal history of antineoplastic chemotherapy: Secondary | ICD-10-CM | POA: Insufficient documentation

## 2024-01-14 DIAGNOSIS — K6289 Other specified diseases of anus and rectum: Secondary | ICD-10-CM | POA: Diagnosis present

## 2024-01-14 DIAGNOSIS — K219 Gastro-esophageal reflux disease without esophagitis: Secondary | ICD-10-CM | POA: Insufficient documentation

## 2024-01-14 DIAGNOSIS — J449 Chronic obstructive pulmonary disease, unspecified: Secondary | ICD-10-CM | POA: Diagnosis not present

## 2024-01-14 DIAGNOSIS — I2510 Atherosclerotic heart disease of native coronary artery without angina pectoris: Secondary | ICD-10-CM | POA: Diagnosis not present

## 2024-01-14 DIAGNOSIS — I110 Hypertensive heart disease with heart failure: Secondary | ICD-10-CM | POA: Diagnosis not present

## 2024-01-14 DIAGNOSIS — F1290 Cannabis use, unspecified, uncomplicated: Secondary | ICD-10-CM | POA: Insufficient documentation

## 2024-01-14 DIAGNOSIS — I509 Heart failure, unspecified: Secondary | ICD-10-CM | POA: Diagnosis not present

## 2024-01-14 DIAGNOSIS — Z7901 Long term (current) use of anticoagulants: Secondary | ICD-10-CM | POA: Diagnosis not present

## 2024-01-14 HISTORY — PX: RECTAL EXAM UNDER ANESTHESIA: SHX6399

## 2024-01-14 HISTORY — PX: RECTAL BIOPSY: SHX2303

## 2024-01-14 SURGERY — BIOPSY, RECTUM
Anesthesia: Monitor Anesthesia Care

## 2024-01-14 MED ORDER — CHLORHEXIDINE GLUCONATE 0.12 % MT SOLN
15.0000 mL | Freq: Once | OROMUCOSAL | Status: AC
  Administered 2024-01-14: 15 mL via OROMUCOSAL

## 2024-01-14 MED ORDER — BUPIVACAINE LIPOSOME 1.3 % IJ SUSP
INTRAMUSCULAR | Status: AC
  Filled 2024-01-14: qty 20

## 2024-01-14 MED ORDER — FENTANYL CITRATE (PF) 100 MCG/2ML IJ SOLN
INTRAMUSCULAR | Status: DC | PRN
  Administered 2024-01-14: 25 ug via INTRAVENOUS

## 2024-01-14 MED ORDER — BUPIVACAINE-EPINEPHRINE (PF) 0.25% -1:200000 IJ SOLN
INTRAMUSCULAR | Status: AC
  Filled 2024-01-14: qty 30

## 2024-01-14 MED ORDER — DEXMEDETOMIDINE HCL IN NACL 80 MCG/20ML IV SOLN
INTRAVENOUS | Status: DC | PRN
  Administered 2024-01-14: 4 ug via INTRAVENOUS

## 2024-01-14 MED ORDER — PHENYLEPHRINE 80 MCG/ML (10ML) SYRINGE FOR IV PUSH (FOR BLOOD PRESSURE SUPPORT)
PREFILLED_SYRINGE | INTRAVENOUS | Status: AC
  Filled 2024-01-14: qty 10

## 2024-01-14 MED ORDER — DEXAMETHASONE SODIUM PHOSPHATE 10 MG/ML IJ SOLN
INTRAMUSCULAR | Status: AC
  Filled 2024-01-14: qty 1

## 2024-01-14 MED ORDER — ACETAMINOPHEN 325 MG PO TABS: 650.0000 mg | ORAL_TABLET | ORAL | Status: DC | PRN

## 2024-01-14 MED ORDER — OXYCODONE HCL 5 MG PO TABS: 5.0000 mg | ORAL_TABLET | Freq: Once | ORAL | Status: DC | PRN

## 2024-01-14 MED ORDER — PROPOFOL 500 MG/50ML IV EMUL
INTRAVENOUS | Status: AC
  Filled 2024-01-14: qty 50

## 2024-01-14 MED ORDER — ROCURONIUM BROMIDE 10 MG/ML (PF) SYRINGE
PREFILLED_SYRINGE | INTRAVENOUS | Status: AC
  Filled 2024-01-14: qty 10

## 2024-01-14 MED ORDER — SODIUM CHLORIDE 0.9 % IV SOLN: 250.0000 mL | INTRAVENOUS | Status: DC | PRN

## 2024-01-14 MED ORDER — MIDAZOLAM HCL 2 MG/2ML IJ SOLN
INTRAMUSCULAR | Status: AC
  Filled 2024-01-14: qty 2

## 2024-01-14 MED ORDER — LIDOCAINE HCL (PF) 2 % IJ SOLN
INTRAMUSCULAR | Status: AC
  Filled 2024-01-14: qty 5

## 2024-01-14 MED ORDER — OXYCODONE HCL 5 MG PO TABS: 5.0000 mg | ORAL_TABLET | ORAL | Status: DC | PRN

## 2024-01-14 MED ORDER — ACETAMINOPHEN 500 MG PO TABS
1000.0000 mg | ORAL_TABLET | ORAL | Status: AC
  Administered 2024-01-14: 1000 mg via ORAL
  Filled 2024-01-14: qty 2

## 2024-01-14 MED ORDER — KETAMINE HCL 10 MG/ML IJ SOLN
INTRAMUSCULAR | Status: DC | PRN
  Administered 2024-01-14 (×2): 15 mg via INTRAVENOUS

## 2024-01-14 MED ORDER — ACETAMINOPHEN 650 MG RE SUPP: 650.0000 mg | RECTAL | Status: DC | PRN

## 2024-01-14 MED ORDER — PROPOFOL 500 MG/50ML IV EMUL
INTRAVENOUS | Status: DC | PRN
  Administered 2024-01-14 (×3): 30 mg via INTRAVENOUS
  Administered 2024-01-14: 80 ug/kg/min via INTRAVENOUS

## 2024-01-14 MED ORDER — ACETAMINOPHEN 500 MG PO TABS
1000.0000 mg | ORAL_TABLET | Freq: Once | ORAL | Status: DC
Start: 2024-01-14 — End: 2024-01-14

## 2024-01-14 MED ORDER — ONDANSETRON HCL 4 MG/2ML IJ SOLN
4.0000 mg | Freq: Once | INTRAMUSCULAR | Status: AC
  Administered 2024-01-14: 4 mg via INTRAVENOUS

## 2024-01-14 MED ORDER — ORAL CARE MOUTH RINSE: 15.0000 mL | Freq: Once | OROMUCOSAL | Status: AC

## 2024-01-14 MED ORDER — MIDAZOLAM HCL 5 MG/5ML IJ SOLN
INTRAMUSCULAR | Status: DC | PRN
  Administered 2024-01-14: 2 mg via INTRAVENOUS

## 2024-01-14 MED ORDER — SODIUM CHLORIDE 0.9% FLUSH: 3.0000 mL | INTRAVENOUS | Status: DC | PRN

## 2024-01-14 MED ORDER — LACTATED RINGERS IV SOLN
INTRAVENOUS | Status: DC
Start: 2024-01-14 — End: 2024-01-14

## 2024-01-14 MED ORDER — DEXMEDETOMIDINE HCL IN NACL 80 MCG/20ML IV SOLN
INTRAVENOUS | Status: AC
Start: 2024-01-14 — End: ?
  Filled 2024-01-14: qty 20

## 2024-01-14 MED ORDER — SODIUM CHLORIDE 0.9% FLUSH: 3.0000 mL | Freq: Two times a day (BID) | INTRAVENOUS | Status: DC

## 2024-01-14 MED ORDER — LIDOCAINE HCL (CARDIAC) PF 100 MG/5ML IV SOSY
PREFILLED_SYRINGE | INTRAVENOUS | Status: DC | PRN
Start: 2024-01-14 — End: 2024-01-14
  Administered 2024-01-14: 60 mg via INTRATRACHEAL

## 2024-01-14 MED ORDER — FENTANYL CITRATE (PF) 100 MCG/2ML IJ SOLN
INTRAMUSCULAR | Status: AC
  Filled 2024-01-14: qty 2

## 2024-01-14 MED ORDER — OXYCODONE HCL 5 MG/5ML PO SOLN: 5.0000 mg | Freq: Once | ORAL | Status: DC | PRN

## 2024-01-14 MED ORDER — AMISULPRIDE (ANTIEMETIC) 5 MG/2ML IV SOLN: 10.0000 mg | Freq: Once | INTRAVENOUS | Status: DC | PRN

## 2024-01-14 MED ORDER — KETAMINE HCL 50 MG/5ML IJ SOSY
PREFILLED_SYRINGE | INTRAMUSCULAR | Status: AC
Start: 2024-01-14 — End: ?
  Filled 2024-01-14: qty 5

## 2024-01-14 MED ORDER — ONDANSETRON HCL 4 MG/2ML IJ SOLN
INTRAMUSCULAR | Status: AC
  Filled 2024-01-14: qty 2

## 2024-01-14 MED ORDER — BUPIVACAINE-EPINEPHRINE 0.5% -1:200000 IJ SOLN
INTRAMUSCULAR | Status: DC | PRN
Start: 2024-01-14 — End: 2024-01-14
  Administered 2024-01-14: 20 mL

## 2024-01-14 MED ORDER — FENTANYL CITRATE PF 50 MCG/ML IJ SOSY
25.0000 ug | PREFILLED_SYRINGE | INTRAMUSCULAR | Status: DC | PRN
Start: 2024-01-14 — End: 2024-01-14

## 2024-01-14 MED ORDER — BUPIVACAINE-EPINEPHRINE (PF) 0.5% -1:200000 IJ SOLN
INTRAMUSCULAR | Status: AC
  Filled 2024-01-14: qty 30

## 2024-01-14 SURGICAL SUPPLY — 36 items
BAG COUNTER SPONGE SURGICOUNT (BAG) IMPLANT
BENZOIN TINCTURE PRP APPL 2/3 (GAUZE/BANDAGES/DRESSINGS) ×1 IMPLANT
BLADE SURG 15 STRL LF DISP TIS (BLADE) IMPLANT
BLADE SURG SZ10 CARB STEEL (BLADE) ×1 IMPLANT
BRIEF MESH DISP LRG (UNDERPADS AND DIAPERS) ×2 IMPLANT
COVER SURGICAL LIGHT HANDLE (MISCELLANEOUS) ×1 IMPLANT
DRAPE UTILITY XL STRL (DRAPES) ×1 IMPLANT
DRSG VASELINE 3X18 (GAUZE/BANDAGES/DRESSINGS) IMPLANT
ELECT REM PT RETURN 15FT ADLT (MISCELLANEOUS) ×1 IMPLANT
GAUZE 4X4 16PLY ~~LOC~~+RFID DBL (SPONGE) IMPLANT
GAUZE PAD ABD 8X10 STRL (GAUZE/BANDAGES/DRESSINGS) IMPLANT
GAUZE SPONGE 4X4 12PLY STRL (GAUZE/BANDAGES/DRESSINGS) IMPLANT
GLOVE BIO SURGEON STRL SZ 6.5 (GLOVE) ×1 IMPLANT
GLOVE INDICATOR 6.5 STRL GRN (GLOVE) ×2 IMPLANT
GOWN STRL REUS W/ TWL XL LVL3 (GOWN DISPOSABLE) ×2 IMPLANT
KIT BASIN OR (CUSTOM PROCEDURE TRAY) ×1 IMPLANT
KIT TURNOVER KIT A (KITS) IMPLANT
LEGGING LITHOTOMY PAIR STRL (DRAPES) IMPLANT
LOOP VESSEL MAXI BLUE (MISCELLANEOUS) IMPLANT
NDL BIOPSY 14GX4.5 SOFT TIS (NEEDLE) IMPLANT
NDL HYPO 25X1 1.5 SAFETY (NEEDLE) ×1 IMPLANT
NDL SAFETY ECLIPSE 18X1.5 (NEEDLE) IMPLANT
NEEDLE BIOPSY 14GX4.5 SOFT TIS (NEEDLE) ×1 IMPLANT
NEEDLE HYPO 25X1 1.5 SAFETY (NEEDLE) ×1 IMPLANT
PACK LITHOTOMY IV (CUSTOM PROCEDURE TRAY) ×1 IMPLANT
PENCIL SMOKE EVACUATOR (MISCELLANEOUS) IMPLANT
SPIKE FLUID TRANSFER (MISCELLANEOUS) ×1 IMPLANT
SPONGE SURGIFOAM ABS GEL 12-7 (HEMOSTASIS) IMPLANT
SUT CHROMIC 2 0 SH (SUTURE) IMPLANT
SUT ETHIBOND 0 (SUTURE) IMPLANT
SUT ETHILON 2 0 PS N (SUTURE) IMPLANT
SUT GUT CHROMIC 3 0 (SUTURE) IMPLANT
SUT VIC AB 2-0 SH 27X BRD (SUTURE) IMPLANT
SUT VIC AB 3-0 SH 27XBRD (SUTURE) IMPLANT
SYR CONTROL 10ML LL (SYRINGE) ×1 IMPLANT
TOWEL OR 17X26 10 PK STRL BLUE (TOWEL DISPOSABLE) ×1 IMPLANT

## 2024-01-14 NOTE — Transfer of Care (Signed)
 Immediate Anesthesia Transfer of Care Note  Patient: Martin [name]  Procedure(s) Performed: CORE NEEDLE BIOPSY ANAL MASS EXAM UNDER ANESTHESIA, RECTUM  Patient Location: PACU  Anesthesia Type:MAC  Level of Consciousness: awake, alert , oriented, and patient cooperative  Airway & Oxygen Therapy: Patient Spontanous Breathing and Patient connected to nasal cannula oxygen  Post-op Assessment: Report given to RN and Post -op Vital signs reviewed and stable  Post vital signs: stable  Last Vitals:  Vitals Value Taken Time  BP 129/65 01/14/24 1131  Temp    Pulse 62 01/14/24 1135  Resp 13 01/14/24 1135  SpO2 100 % 01/14/24 1135  Vitals shown include unfiled device data.  Last Pain:  Vitals:   01/14/24 0909  TempSrc:   PainSc: 7          Complications: No notable events documented.

## 2024-01-14 NOTE — Anesthesia Procedure Notes (Signed)
 Procedure Name: MAC Date/Time: 01/14/2024 10:51 AM  Performed by: Norvell Beers, CRNAPre-anesthesia Checklist: Patient identified, Emergency Drugs available, Suction available and Patient being monitored Patient Re-evaluated:Patient Re-evaluated prior to induction Oxygen Delivery Method: Simple face mask Preoxygenation: Pre-oxygenation with 100% oxygen Induction Type: IV induction Placement Confirmation: positive ETCO2

## 2024-01-14 NOTE — Anesthesia Postprocedure Evaluation (Signed)
 Anesthesia Post Note  Patient: Martin [name]  Procedure(s) Performed: CORE NEEDLE BIOPSY ANAL MASS EXAM UNDER ANESTHESIA, RECTUM     Patient location during evaluation: PACU Anesthesia Type: MAC Level of consciousness: awake and alert Pain management: pain level controlled Vital Signs Assessment: post-procedure vital signs reviewed and stable Respiratory status: spontaneous breathing, nonlabored ventilation, respiratory function stable and patient connected to nasal cannula oxygen Cardiovascular status: stable and blood pressure returned to baseline Postop Assessment: no apparent nausea or vomiting Anesthetic complications: no  No notable events documented.  Last Vitals:  Vitals:   01/14/24 1200 01/14/24 1215  BP: 139/81 (!) 145/71  Pulse: 68 69  Resp: 12 16  Temp:  36.6 C  SpO2: 100% 99%    Last Pain:  Vitals:   01/14/24 1215  TempSrc:   PainSc: 0-No pain                 [name]

## 2024-01-14 NOTE — Op Note (Signed)
 01/14/2024  11:15 AM  PATIENT:  Martin [name]  63 y.o. male  Patient Care Team: Hershell Lose, NP as PCP - General (Nurse Practitioner) Oralee Billow, MD (General Surgery) Terry Ficks, NP as Nurse Practitioner (Nurse Practitioner) Rosslyn Coons, RN as Oncology Nurse Navigator  PRE-OPERATIVE DIAGNOSIS:  ANAL MASS  POST-OPERATIVE DIAGNOSIS:  ANAL MASS  PROCEDURE:  Core needle biopsy of anal mass EXAM UNDER ANESTHESIA, RECTUM    Surgeon(s): Joyce Nixon, MD  ASSISTANT: none   ANESTHESIA:   local and MAC  SPECIMEN:  Source of Specimen:  anal mass, h/o anal SCC  DISPOSITION OF SPECIMEN:  PATHOLOGY  COUNTS:  YES  PLAN OF CARE: Discharge to home after PACU  PATIENT DISPOSITION:  PACU - hemodynamically stable.  INDICATION: 63 y.o. M with history of anal cancer in 2013 s/p chemoRT.     OR FINDINGS: R anterior anal mass extending beyond sphincter complex anterior and laterally.  DESCRIPTION: the patient was identified in the preoperative holding area and taken to the OR where they were laid on the operating room table.  MAC anesthesia was induced without difficulty. The patient was then positioned in prone jackknife position with buttocks gently taped apart.  The patient was then prepped and draped in usual sterile fashion.  SCDs were noted to be in place prior to the initiation of anesthesia. A surgical timeout was performed indicating the correct patient, procedure, positioning and need for preoperative antibiotics.  A rectal block was performed using Marcaine  with epinephrine .    I began with a digital rectal exam.  The mass was partially narrowing the anal canal.  It was present in the right anterior anal canal.  It extended anteriorly beyond the sphincter complex and into the right lateral ischiorectal space.  It was erupting through the skin in the right anterior perianal region.  A core needle biopsy was obtained in several sections for tissue diagnosis.  Hemostasis  was achieved using electrocautery and direct pressure.  A dressing was applied.  The patient was awakened from anesthesia and sent to the postanesthesia care unit in stable condition.  All counts were correct per operating room staff.  Fernande Howells, MD  Colorectal and General Surgery [facility] Surgery

## 2024-01-14 NOTE — Interval H&P Note (Signed)
 History and Physical Interval Note:  01/14/2024 10:09 AM  Martin [name]  has presented today for surgery, with the diagnosis of ANAL MASS.  The various methods of treatment have been discussed with the patient and family. After consideration of risks, benefits and other options for treatment, the patient has consented to  Procedure(s): BIOPSY, RECTUM (N/A) EXAM UNDER ANESTHESIA, RECTUM (N/A) and biopsy as a surgical intervention.  The patient's history has been reviewed, patient examined, no change in status, stable for surgery.  I have reviewed the patient's chart and labs.  Questions were answered to the patient's satisfaction.     Fernande Howells, MD  Colorectal and General Surgery [facility] Surgery

## 2024-01-14 NOTE — Discharge Instructions (Addendum)
Beginning the day after surgery: ? ?You may sit in a tub of warm water 2-3 times a day to relieve discomfort. ? ?Eat a regular diet high in fiber.  Avoid foods that give you constipation or diarrhea.  Avoid foods that are difficult to digest, such as seeds, nuts, corn or popcorn. ? ?Do not go any longer than 2 days without a bowel movement.  You may take a dose of Milk of Magnesia if you become constipated.   ? ?Drink 6-8 glasses of water daily. ? ?Walking is encouraged.  Avoid strenuous activity and heavy lifting for one month after surgery.   ? ?Call the office if you have any questions or concerns.  Call immediately if you develop: ? ?Excessive rectal bleeding (more than a cup or passing large clots) ?Increased discomfort ?Fever greater than 100 F ?Difficulty urinating  ?

## 2024-01-15 ENCOUNTER — Encounter (HOSPITAL_COMMUNITY): Payer: Self-pay | Admitting: General Surgery

## 2024-01-15 ENCOUNTER — Other Ambulatory Visit: Payer: Self-pay

## 2024-01-15 ENCOUNTER — Emergency Department (HOSPITAL_COMMUNITY)
Admission: EM | Admit: 2024-01-15 | Discharge: 2024-01-16 | Disposition: A | Discharge status: Home / Self Care | Attending: Emergency Medicine | Admitting: Emergency Medicine

## 2024-01-15 DIAGNOSIS — K5900 Constipation, unspecified: Secondary | ICD-10-CM | POA: Insufficient documentation

## 2024-01-15 DIAGNOSIS — K625 Hemorrhage of anus and rectum: Secondary | ICD-10-CM | POA: Insufficient documentation

## 2024-01-15 DIAGNOSIS — C210 Malignant neoplasm of anus, unspecified: Secondary | ICD-10-CM | POA: Diagnosis not present

## 2024-01-15 DIAGNOSIS — T402X5A Adverse effect of other opioids, initial encounter: Secondary | ICD-10-CM

## 2024-01-15 LAB — CBC
HCT: 35.1 % — ABNORMAL LOW (ref 39.0–52.0)
Hemoglobin: 11.4 g/dL — ABNORMAL LOW (ref 13.0–17.0)
MCH: 28.2 pg (ref 26.0–34.0)
MCHC: 32.5 g/dL (ref 30.0–36.0)
MCV: 86.9 fL (ref 80.0–100.0)
Platelets: 142 10*3/uL — ABNORMAL LOW (ref 150–400)
RBC: 4.04 MIL/uL — ABNORMAL LOW (ref 4.22–5.81)
RDW: 14.9 % (ref 11.5–15.5)
WBC: 10.7 10*3/uL — ABNORMAL HIGH (ref 4.0–10.5)
nRBC: 0 % (ref 0.0–0.2)

## 2024-01-15 LAB — COMPREHENSIVE METABOLIC PANEL WITH GFR
ALT: 16 U/L (ref 0–44)
AST: 23 U/L (ref 15–41)
Albumin: 3.8 g/dL (ref 3.5–5.0)
Alkaline Phosphatase: 125 U/L (ref 38–126)
Anion gap: 7 (ref 5–15)
BUN: 7 mg/dL — ABNORMAL LOW (ref 8–23)
CO2: 26 mmol/L (ref 22–32)
Calcium: 9.2 mg/dL (ref 8.9–10.3)
Chloride: 105 mmol/L (ref 98–111)
Creatinine, Ser: 0.84 mg/dL (ref 0.61–1.24)
GFR, Estimated: >60 mL/min (ref >60)
Glucose, Bld: 95 mg/dL (ref 70–99)
Potassium: 3.8 mmol/L (ref 3.5–5.1)
Sodium: 138 mmol/L (ref 135–145)
Total Bilirubin: 0.4 mg/dL (ref 0.0–1.2)
Total Protein: 7.1 g/dL (ref 6.5–8.1)

## 2024-01-15 LAB — TYPE AND SCREEN
ABO/RH(D): A POS
Antibody Screen: NEGATIVE

## 2024-01-15 LAB — POC OCCULT BLOOD, ED: Fecal Occult Bld: NEGATIVE

## 2024-01-15 LAB — SURGICAL PATHOLOGY

## 2024-01-15 NOTE — ED Triage Notes (Signed)
 Constipation for 3 days, pt has anal cancer and states he had a biopsy yesterday. Pt has had bright red drops of blood since this AM. He is unsure if this is from his incision site from the biopsy.

## 2024-01-15 NOTE — ED Provider Triage Note (Cosign Needed Addendum)
 Emergency Medicine Provider Triage Evaluation Note  Martin [name] , a 63 y.o. male  was evaluated in triage.  Pt complains of rectal bleeding. He had a biopsy of the rectum yesterday and has noticed bright red blood drops from rectum for the past hour. Has constipation and rectal pain, which has been going on for several days.  Review of Systems  Positive: Rectal bleeding Negative: Fevers   Physical Exam  BP (!) 163/80   Pulse 77   Temp 97.6 F (36.4 C) (Oral)   Resp 15   Ht 5\' 11"  (1.803 m)   Wt 59 kg   SpO2 100%   BMI 18.13 kg/m  Gen:   Awake, no distress   Resp:  Normal effort  MSK:   Moves extremities without difficulty  Other:    Medical Decision Making  Medically screening exam initiated at 9:26 PM.  Appropriate orders placed.  Martin [name] was informed that the remainder of the evaluation will be completed by another provider, this initial triage assessment does not replace that evaluation, and the importance of remaining in the ED until their evaluation is complete.  Is on plavix , no blood thinner use.   Sonnie Dusky, PA-C 01/15/24 2128    Sonnie Dusky, PA-C 01/15/24 2132

## 2024-01-16 ENCOUNTER — Emergency Department (HOSPITAL_COMMUNITY)

## 2024-01-16 DIAGNOSIS — K625 Hemorrhage of anus and rectum: Secondary | ICD-10-CM | POA: Diagnosis not present

## 2024-01-16 MED ORDER — SODIUM CHLORIDE 0.9 % IV BOLUS
500.0000 mL | Freq: Once | INTRAVENOUS | Status: AC
  Administered 2024-01-16: 500 mL via INTRAVENOUS

## 2024-01-16 MED ORDER — IOHEXOL 300 MG/ML  SOLN
100.0000 mL | Freq: Once | INTRAMUSCULAR | Status: AC | PRN
  Administered 2024-01-16: 100 mL via INTRAVENOUS

## 2024-01-16 NOTE — ED Provider Notes (Signed)
 [place] EMERGENCY DEPARTMENT AT [facility] Provider Note   CSN: [id number] Arrival date & time: 01/15/24  2046     History  Chief Complaint  Patient presents with   Rectal Bleeding    Martin [name] is a 63 y.o. male.  The history is provided by the patient.  Rectal Bleeding Quality:  Bright red Amount:  Scant Timing:  Rare Chronicity:  New Context: not anal fissures and not rectal pain   Similar prior episodes: no   Relieved by:  Nothing Worsened by:  Nothing Ineffective treatments:  None tried Associated symptoms: no abdominal pain, no dizziness, no epistaxis and no fever   Risk factors: no anticoagulant use, no hx of colorectal cancer and no hx of colorectal surgery        Home Medications Prior to Admission medications   Medication Sig Start Date End Date Taking? Authorizing Provider  acetaminophen  (TYLENOL ) 500 MG tablet Take 500 mg by mouth every 6 (six) hours as needed for moderate pain (pain score 4-6).    [provider]  albuterol  (VENTOLIN  HFA) 108 (90 Base) MCG/ACT inhaler Inhale 2 puffs into the lungs every 6 (six) hours as needed for wheezing or shortness of breath. 05/11/23   Wilhemena Harbour, NP  atorvastatin  (LIPITOR) 10 MG tablet Take 1 tablet (10 mg total) by mouth daily. Additional refills per primary provider Patient not taking: Reported on 01/05/2024 05/18/23 05/17/24  Brown, Fallon E, NP  atorvastatin  (LIPITOR) 40 MG tablet Take 40 mg by mouth daily.    [provider]  azelastine  (ASTELIN ) 0.1 % nasal spray Place 1 spray into both nostrils 2 (two) times daily. Use in each nostril as directed Patient not taking: Reported on 12/14/2023 12/05/23   Reddick, Johnathan B, NP  busPIRone (BUSPAR) 5 MG tablet Take 5 mg by mouth 2 (two) times daily.    [provider]  carvedilol (COREG) 3.125 MG tablet Take 3.125 mg by mouth 2 (two) times daily with a meal. 12/23/23   [provider]  cloNIDine  (CATAPRES ) 0.2 MG  tablet Take 0.2 mg by mouth at bedtime.    [provider]  clopidogrel  (PLAVIX ) 75 MG tablet Take 1 tablet (75 mg total) by mouth daily. 10/07/22   Brown, Fallon E, NP  docusate sodium  (COLACE) 100 MG capsule Take 100 mg by mouth daily as needed for mild constipation.    [provider]  EPINEPHrine  (EPIPEN  2-PAK) 0.3 mg/0.3 mL IJ SOAJ injection Inject 0.3 mg (1 pen) into the muscle as needed for anaphylaxis. 12/30/22   Senaida Dama, NP  losartan (COZAAR) 25 MG tablet Take 25 mg by mouth daily. 08/27/23   [provider]  methadone  (DOLOPHINE ) 1 MG/1ML solution Take 36 mg by mouth daily.    [provider]  mupirocin  cream (BACTROBAN ) 2 % Apply 1 Application topically 2 (two) times daily. Patient not taking: Reported on 12/14/2023 12/05/23   Reddick, Johnathan B, NP  oxyCODONE  (OXY IR/ROXICODONE ) 5 MG immediate release tablet Take 1-2 tablets (5-10 mg total) by mouth every 4 (four) hours as needed for severe pain (pain score 7-10). 01/12/24   Thomas, Lisa K, NP  oxymetazoline  (AFRIN) 0.05 % nasal spray Place 1 spray into both nostrils 2 (two) times daily. Patient not taking: Reported on 12/14/2023 08/19/23   Rolinda Climes, DO  silver  sulfADIAZINE  (SILVADENE ) 1 % cream Apply 1 Application topically daily. Patient not taking: Reported on 01/05/2024 12/05/23   Reddick, Johnathan B, NP  SPIRIVA RESPIMAT 2.5 MCG/ACT AERS Inhale 2 puffs into the lungs daily. 06/02/23   [provider]  tamsulosin  (FLOMAX ) 0.4 MG CAPS capsule Take 1 capsule (0.4 mg total) by mouth at bedtime. Patient taking differently: Take 0.8 mg by mouth in the morning. 09/16/22   Abraham Abo, MD  traZODone  (DESYREL ) 50 MG tablet Take 50 mg by mouth at bedtime. 08/04/23   [provider]  Vitamin D , Cholecalciferol, 25 MCG (1000 UT) TABS Take 1,000 Units by mouth daily.    [provider]  sucralfate  (CARAFATE ) 1 GM/10ML suspension Take 10 mLs (1 g total) by mouth 4 (four) times  daily -  with meals and at bedtime. Patient not taking: Reported on 11/28/2019 10/21/19 06/13/20  Armanie Martine, MD      Allergies    Aspirin , Yellow jacket venom [bee venom], Zolpidem , Codeine, Ibuprofen , and Morphine  and codeine    Review of Systems   Review of Systems  Constitutional:  Negative for fever.  HENT:  Negative for nosebleeds.   Respiratory:  Negative for wheezing and stridor.   Gastrointestinal:  Positive for hematochezia. Negative for abdominal pain.  Neurological:  Negative for dizziness.  All other systems reviewed and are negative.   Physical Exam Updated Vital Signs BP 121/68   Pulse 74   Temp 98.3 F (36.8 C) (Oral)   Resp 17   Ht 5\' 11"  (1.803 m)   Wt 59 kg   SpO2 97%   BMI 18.13 kg/m  Physical Exam Vitals and nursing note reviewed.  Constitutional:      General: He is not in acute distress.    Appearance: He is well-developed. He is not diaphoretic.  HENT:     Head: Normocephalic and atraumatic.     Nose: Nose normal.  Eyes:     Conjunctiva/sclera: Conjunctivae normal.     Pupils: Pupils are equal, round, and reactive to light.  Cardiovascular:     Rate and Rhythm: Normal rate and regular rhythm.     Pulses: Normal pulses.     Heart sounds: Normal heart sounds.  Pulmonary:     Effort: Pulmonary effort is normal.     Breath sounds: Normal breath sounds. No wheezing or rales.  Abdominal:     General: Bowel sounds are normal.     Palpations: Abdomen is soft.     Tenderness: There is no abdominal tenderness. There is no guarding or rebound.  Genitourinary:    Rectum: Guaiac result negative.  Musculoskeletal:        General: Normal range of motion.     Cervical back: Normal range of motion and neck supple.  Skin:    General: Skin is warm and dry.     Capillary Refill: Capillary refill takes less than 2 seconds.  Neurological:     General: No focal deficit present.     Mental Status: He is alert and oriented to person, place, and time.      Deep Tendon Reflexes: Reflexes normal.  Psychiatric:        Mood and Affect: Mood normal.        Behavior: Behavior normal.     ED Results / Procedures / Treatments   Labs (all labs ordered are listed, but only abnormal results are displayed) Results for orders placed or performed during the hospital encounter of 01/15/24  Comprehensive metabolic panel   Collection Time: 01/15/24  9:30 PM  Result Value Ref Range   Sodium 138 135 - 145 mmol/L  Potassium 3.8 3.5 - 5.1 mmol/L   Chloride 105 98 - 111 mmol/L   CO2 26 22 - 32 mmol/L   Glucose, Bld 95 70 - 99 mg/dL   BUN 7 (L) 8 - 23 mg/dL   Creatinine, Ser 1.61 0.61 - 1.24 mg/dL   Calcium  9.2 8.9 - 10.3 mg/dL   Total Protein 7.1 6.5 - 8.1 g/dL   Albumin 3.8 3.5 - 5.0 g/dL   AST 23 15 - 41 U/L   ALT 16 0 - 44 U/L   Alkaline Phosphatase 125 38 - 126 U/L   Total Bilirubin 0.4 0.0 - 1.2 mg/dL   GFR, Estimated >09 >60 mL/min   Anion gap 7 5 - 15  CBC   Collection Time: 01/15/24  9:30 PM  Result Value Ref Range   WBC 10.7 (H) 4.0 - 10.5 K/uL   RBC 4.04 (L) 4.22 - 5.81 MIL/uL   Hemoglobin 11.4 (L) 13.0 - 17.0 g/dL   HCT 45.4 (L) 09.8 - 11.9 %   MCV 86.9 80.0 - 100.0 fL   MCH 28.2 26.0 - 34.0 pg   MCHC 32.5 30.0 - 36.0 g/dL   RDW 14.7 82.9 - 56.2 %   Platelets 142 (L) 150 - 400 K/uL   nRBC 0.0 0.0 - 0.2 %  Type and screen [facility]   Collection Time: 01/15/24  9:30 PM  Result Value Ref Range   ABO/RH(D) A POS    Antibody Screen NEG    Sample Expiration      01/18/2024,2359 Performed at [facility], [address], [place], [place] [address]   POC occult blood, ED   Collection Time: 01/15/24 11:33 PM  Result Value Ref Range   Fecal Occult Bld NEGATIVE NEGATIVE   *Note: Due to a large number of results and/or encounters for the requested time period, some results have not been displayed. A complete set of results can be found in Results Review.   CT ABDOMEN PELVIS W CONTRAST Result  Date: 01/16/2024 CLINICAL DATA:  Anal cancer status post biopsy presenting with constipation and bright red blood per rectum. EXAM: CT ABDOMEN AND PELVIS WITH CONTRAST TECHNIQUE: Multidetector CT imaging of the abdomen and pelvis was performed using the standard protocol following bolus administration of intravenous contrast. RADIATION DOSE REDUCTION: This exam was performed according to the departmental dose-optimization program which includes automated exposure control, adjustment of the mA and/or kV according to patient size and/or use of iterative reconstruction technique. CONTRAST:  OMNIPAQUE  IOHEXOL  300 MG/ML  SOLN COMPARISON:  July 31, 2023 FINDINGS: Lower chest: Stable chronic cystic appearing areas with chronic scarring and/or atelectasis, are seen within the right lung base. Hepatobiliary: No focal liver abnormality is seen. No gallstones, gallbladder wall thickening, or biliary dilatation. Pancreas: A few punctate parenchymal calcifications are seen within the head of the pancreas. There is no evidence of peripancreatic inflammatory fat stranding or pancreatic ductal dilatation. Spleen: Normal in size without focal abnormality. Adrenals/Urinary Tract: Adrenal glands are unremarkable. Kidneys are normal, without renal calculi, focal lesion, or hydronephrosis. Bladder is unremarkable. Stomach/Bowel: A large, stable gastric diverticulum is seen along the posterior aspect of the fundus. The appendix is surgically absent. Stool is present throughout the large bowel. There is no evidence of bowel dilatation. Diffuse, asymmetric rectal and anal thickening is noted. This is seen on the prior study. Vascular/Lymphatic: Aortic atherosclerosis. Vascular stents are seen within the right external iliac artery and bilateral superficial femoral arteries. No enlarged abdominal  or pelvic lymph nodes. Reproductive: Prostate is unremarkable. Other: No abdominal wall hernia or abnormality. No abdominopelvic  ascites. Musculoskeletal: Degenerative changes are seen within the lumbar spine. IMPRESSION: 1. Diffuse, asymmetric rectal and anal thickening, as described above, which may represent sequelae associated with the patient's known anal cancer and recent biopsy. 2. Large, stable gastric diverticulum. 3. Evidence of prior cholecystectomy. 4. Aortic atherosclerosis. Electronically Signed   By: Virgle Grime M.D.   On: 01/16/2024 02:54   NM PET Image Initial (PI) Skull Base To Thigh Result Date: 12/24/2023 CLINICAL DATA:  Staging anal cancer. EXAM: NUCLEAR MEDICINE PET SKULL BASE TO THIGH TECHNIQUE: 6.8 mCi F-18 FDG was injected intravenously. Full-ring PET imaging was performed from the skull base to thigh after the radiotracer. CT data was obtained and used for attenuation correction and anatomic localization. Fasting blood glucose: 81 mg/dl COMPARISON:  PET-CT February 2013.  Abdomen pelvis CT 07/31/2023. FINDINGS: Mediastinal blood pool activity: SUV max 1.9 Liver activity: SUV max 2.3 NECK: No abnormal radiotracer uptake in the neck including along the lymph node change of the submandibular, posterior triangle or internal jugular region. Near symmetric uptake seen along the visualized intracranial compartment. Incidental CT findings: Visualized portions of the paranasal sinuses and mastoid air cells are clear except for some slight mucosal thickening along the right maxillary paranasal sinus. Scattered vascular calcifications are identified. The parotid glands, submandibular glands and thyroid gland are unremarkable. CHEST: There is uptake along the sternum running vertically. Patient is status post median sternotomy with prosthetic aortic valve. No specific abnormal uptake above mediastinal blood pool in the axillary regions, hilum or mediastinum. No abnormal lung uptake. Incidental CT findings: No pericardial effusion. Slightly patulous thoracic esophagus. There are several prominent nodes identified in the  mediastinum but again no abnormal uptake. Example node seen anterior to the right main bronchus has short axis 8 mm on series 4, image 78. On the remote study this node measured 6 mm in short axis. Left AP window node short axis dimension of 9 mm on image 78 today and previously 8 mm. Other nodes as well as subcarinal, left prevascular. Overall these are minimally increased from previous. Emphysematous changes the lungs. Dependent atelectasis. Breathing motion. No pleural effusion or pneumothorax. Apical pleural thickening. 4 mm lingular nodule on series 4, image 89, unchanged from a CT scan of 04/19/2022. ABDOMEN/PELVIS: There is physiologic distribution radiotracer along the parenchymal organs and renal collecting systems. There is specific nodular uptake identified along the anal region centered more right anterior with maximum SUV value 11.5. The anal region is diffusely thickened and nodular in there is adjacent stranding. Diameter of the area uptake approaches 2.9 by 1.7 cm in the axial plane on image 212. Longitudinal E seen measuring 3.6 cm. No additional areas of abnormal radiotracer uptake. No areas of nodal uptake. Incidental CT findings: Diffuse vascular calcifications. Vascular stent seen along the right external iliac artery. No abnormal calcifications seen within either kidney nor along the expected course of either ureter. Preserved contour to the urinary bladder. Bowel is nondilated. Scattered colonic stool. Gallbladder is present. No free fluid. SKELETON: No abnormal uptake identified along the visible osseous structures. Scattered degenerative changes along the spine and pelvis. : Diffuse nodular thickening along the anal canal with significant abnormal radiotracer uptake in this location particularly along the right-sided and anteriorly for proximally the 7 o'clock position to 2 o'clock. Stranding in this location. No additional areas of abnormal uptake to suggest spread of disease at this time.  There is abnormal uptake along the sternum but patient is status post median sternotomy. Please correlate with time course of surgery. Electronically Signed   By: Adrianna Horde M.D.   On: 12/24/2023 16:42    Radiology CT ABDOMEN PELVIS W CONTRAST Result Date: 01/16/2024 CLINICAL DATA:  Anal cancer status post biopsy presenting with constipation and bright red blood per rectum. EXAM: CT ABDOMEN AND PELVIS WITH CONTRAST TECHNIQUE: Multidetector CT imaging of the abdomen and pelvis was performed using the standard protocol following bolus administration of intravenous contrast. RADIATION DOSE REDUCTION: This exam was performed according to the departmental dose-optimization program which includes automated exposure control, adjustment of the mA and/or kV according to patient size and/or use of iterative reconstruction technique. CONTRAST:  OMNIPAQUE  IOHEXOL  300 MG/ML  SOLN COMPARISON:  July 31, 2023 FINDINGS: Lower chest: Stable chronic cystic appearing areas with chronic scarring and/or atelectasis, are seen within the right lung base. Hepatobiliary: No focal liver abnormality is seen. No gallstones, gallbladder wall thickening, or biliary dilatation. Pancreas: A few punctate parenchymal calcifications are seen within the head of the pancreas. There is no evidence of peripancreatic inflammatory fat stranding or pancreatic ductal dilatation. Spleen: Normal in size without focal abnormality. Adrenals/Urinary Tract: Adrenal glands are unremarkable. Kidneys are normal, without renal calculi, focal lesion, or hydronephrosis. Bladder is unremarkable. Stomach/Bowel: A large, stable gastric diverticulum is seen along the posterior aspect of the fundus. The appendix is surgically absent. Stool is present throughout the large bowel. There is no evidence of bowel dilatation. Diffuse, asymmetric rectal and anal thickening is noted. This is seen on the prior study. Vascular/Lymphatic: Aortic atherosclerosis. Vascular  stents are seen within the right external iliac artery and bilateral superficial femoral arteries. No enlarged abdominal or pelvic lymph nodes. Reproductive: Prostate is unremarkable. Other: No abdominal wall hernia or abnormality. No abdominopelvic ascites. Musculoskeletal: Degenerative changes are seen within the lumbar spine. IMPRESSION: 1. Diffuse, asymmetric rectal and anal thickening, as described above, which may represent sequelae associated with the patient's known anal cancer and recent biopsy. 2. Large, stable gastric diverticulum. 3. Evidence of prior cholecystectomy. 4. Aortic atherosclerosis. Electronically Signed   By: Virgle Grime M.D.   On: 01/16/2024 02:54    Procedures Procedures    Medications Ordered in ED Medications  sodium chloride  0.9 % bolus 500 mL (0 mLs Intravenous Stopped 01/16/24 0108)  iohexol  (OMNIPAQUE ) 300 MG/ML solution 100 mL (100 mLs Intravenous Contrast Given 01/16/24 0016)    ED Course/ Medical Decision Making/ A&P                                 Medical Decision Making Patient states spots of blood post biopsy and constipation   Amount and/or Complexity of Data Reviewed External Data Reviewed: notes.    Details: Previous notes reviewed  Labs: ordered.    Details: Normal sodium 138, normal potassium 3.8, normal creatinine.  Normal white count 10.7, hemoglobin 11.4 stable from previous platelets slight low 142.  Fecal occult is negative  Radiology: ordered and independent interpretation performed.    Details: Constipation and full bladder   Risk Prescription drug management. Risk Details: Well appearing, sleeping in the room.  On home narcotics, needs a bowel regimen.  Miralax TID and follow up with your surgeon.  Stable for discharge,      Final Clinical Impression(s) / ED Diagnoses  No signs of systemic illness or infection. The patient is nontoxic-appearing on  exam and vital signs are within normal limits.  I have reviewed the triage  vital signs and the nursing notes. Pertinent labs & imaging results that were available during my care of the patient were reviewed by me and considered in my medical decision making (see chart for details). After history, exam, and medical workup I feel the patient has been appropriately medically screened and is safe for discharge home. Pertinent diagnoses were discussed with the patient. Patient was given return precautions.      Michial Disney, MD 01/16/24 [phone]

## 2024-01-16 NOTE — ED Notes (Signed)
 Bladder scanner showed 

## 2024-01-16 NOTE — Discharge Instructions (Addendum)
 Miralax  1 capful 3 times a day

## 2024-01-18 ENCOUNTER — Inpatient Hospital Stay: Attending: Oncology | Admitting: Oncology

## 2024-01-18 VITALS — BP 126/73 | HR 70 | Temp 98.2°F | Resp 18 | Ht 71.0 in | Wt 130.0 lb

## 2024-01-18 DIAGNOSIS — M47816 Spondylosis without myelopathy or radiculopathy, lumbar region: Secondary | ICD-10-CM | POA: Diagnosis not present

## 2024-01-18 DIAGNOSIS — R159 Full incontinence of feces: Secondary | ICD-10-CM | POA: Insufficient documentation

## 2024-01-18 DIAGNOSIS — F1120 Opioid dependence, uncomplicated: Secondary | ICD-10-CM | POA: Insufficient documentation

## 2024-01-18 DIAGNOSIS — J449 Chronic obstructive pulmonary disease, unspecified: Secondary | ICD-10-CM | POA: Diagnosis not present

## 2024-01-18 DIAGNOSIS — C20 Malignant neoplasm of rectum: Secondary | ICD-10-CM | POA: Insufficient documentation

## 2024-01-18 DIAGNOSIS — I739 Peripheral vascular disease, unspecified: Secondary | ICD-10-CM | POA: Insufficient documentation

## 2024-01-18 DIAGNOSIS — Z923 Personal history of irradiation: Secondary | ICD-10-CM | POA: Diagnosis not present

## 2024-01-18 DIAGNOSIS — G893 Neoplasm related pain (acute) (chronic): Secondary | ICD-10-CM | POA: Diagnosis not present

## 2024-01-18 DIAGNOSIS — K314 Gastric diverticulum: Secondary | ICD-10-CM | POA: Diagnosis not present

## 2024-01-18 DIAGNOSIS — K56609 Unspecified intestinal obstruction, unspecified as to partial versus complete obstruction: Secondary | ICD-10-CM | POA: Insufficient documentation

## 2024-01-18 DIAGNOSIS — K625 Hemorrhage of anus and rectum: Secondary | ICD-10-CM | POA: Insufficient documentation

## 2024-01-18 DIAGNOSIS — C210 Malignant neoplasm of anus, unspecified: Secondary | ICD-10-CM

## 2024-01-18 DIAGNOSIS — I700 Atherosclerosis of aorta: Secondary | ICD-10-CM | POA: Diagnosis not present

## 2024-01-18 MED ORDER — OXYCODONE-ACETAMINOPHEN 10-325 MG PO TABS: 1.0000 | ORAL_TABLET | ORAL | 0 refills | Status: DC | PRN

## 2024-01-18 NOTE — Progress Notes (Signed)
 Martin [name] OFFICE PROGRESS NOTE   Diagnosis: Anal cancer  INTERVAL HISTORY:   Martin [name] returns as scheduled.  He continues to have anal pain.  He underwent an examination under anesthesia by Martin [name] on 01/14/2024.  He was noted to have a right anterior anal mass extending beyond the sphincter complex.  A biopsy revealed invasive squamous cell carcinoma. Martin [name] developed constipation following procedure.  He presented to the emergency room 01/15/2024 with creased pain.  A CT revealed diffuse asymmetric rectal and anal thickening.  Stool was present throughout the colon.  No evidence of bowel dilation.  He reports relief of constipation when he used a "suppository ".  He reports better relief of pain when he has taken oxycodone  10/325 in the past, compared to the current plain oxycodone .  Objective:  Vital signs in last 24 hours:  Blood pressure 126/73, pulse 70, temperature 98.2 F (36.8 C), resp. rate 18, height 5\' 11"  (1.803 m), weight 130 lb (59 kg), SpO2 100%.    Resp: End inspiratory rales at the extreme posterior lateral base bilaterally, no respiratory distress Cardio: Regular rate and rhythm GI: Nontender, no hepatosplenomegaly Vascular: No leg edema Rectal: Ulcerated firm tissue surrounding the anus/anal margin  Lab Results:  Lab Results  Component Value Date   WBC 10.7 (H) 01/15/2024   HGB 11.4 (L) 01/15/2024   HCT 35.1 (L) 01/15/2024   MCV 86.9 01/15/2024   PLT 142 (L) 01/15/2024   NEUTROABS 8.9 (H) 04/28/2022    CMP  Lab Results  Component Value Date   NA 138 01/15/2024   K 3.8 01/15/2024   CL 105 01/15/2024   CO2 26 01/15/2024   GLUCOSE 95 01/15/2024   BUN 7 (L) 01/15/2024   CREATININE 0.84 01/15/2024   CALCIUM  9.2 01/15/2024   PROT 7.1 01/15/2024   ALBUMIN 3.8 01/15/2024   AST 23 01/15/2024   ALT 16 01/15/2024   ALKPHOS 125 01/15/2024   BILITOT 0.4 01/15/2024   GFRNONAA >60 01/15/2024   GFRAA >60 03/10/2020    No results  found for: "CEA1", "CEA", "ZOX096", "CA125"  Lab Results  Component Value Date   INR 1.1 04/19/2022   LABPROT 13.7 04/19/2022    Imaging:  CT ABDOMEN PELVIS W CONTRAST Result Date: 01/16/2024 CLINICAL DATA:  Anal cancer status post biopsy presenting with constipation and bright red blood per rectum. EXAM: CT ABDOMEN AND PELVIS WITH CONTRAST TECHNIQUE: Multidetector CT imaging of the abdomen and pelvis was performed using the standard protocol following bolus administration of intravenous contrast. RADIATION DOSE REDUCTION: This exam was performed according to the departmental dose-optimization program which includes automated exposure control, adjustment of the mA and/or kV according to patient size and/or use of iterative reconstruction technique. CONTRAST:  OMNIPAQUE  IOHEXOL  300 MG/ML  SOLN COMPARISON:  July 31, 2023 FINDINGS: Lower chest: Stable chronic cystic appearing areas with chronic scarring and/or atelectasis, are seen within the right lung base. Hepatobiliary: No focal liver abnormality is seen. No gallstones, gallbladder wall thickening, or biliary dilatation. Pancreas: A few punctate parenchymal calcifications are seen within the head of the pancreas. There is no evidence of peripancreatic inflammatory fat stranding or pancreatic ductal dilatation. Spleen: Normal in size without focal abnormality. Adrenals/Urinary Tract: Adrenal glands are unremarkable. Kidneys are normal, without renal calculi, focal lesion, or hydronephrosis. Bladder is unremarkable. Stomach/Bowel: A large, stable gastric diverticulum is seen along the posterior aspect of the fundus. The appendix is surgically absent. Stool is present throughout the large bowel. There  is no evidence of bowel dilatation. Diffuse, asymmetric rectal and anal thickening is noted. This is seen on the prior study. Vascular/Lymphatic: Aortic atherosclerosis. Vascular stents are seen within the right external iliac artery and bilateral  superficial femoral arteries. No enlarged abdominal or pelvic lymph nodes. Reproductive: Prostate is unremarkable. Other: No abdominal wall hernia or abnormality. No abdominopelvic ascites. Musculoskeletal: Degenerative changes are seen within the lumbar spine. IMPRESSION: 1. Diffuse, asymmetric rectal and anal thickening, as described above, which may represent sequelae associated with the patient's known anal cancer and recent biopsy. 2. Large, stable gastric diverticulum. 3. Evidence of prior cholecystectomy. 4. Aortic atherosclerosis. Electronically Signed   By: Martin [name] M.D.   On: 01/16/2024 02:54    Medications: I have reviewed the patient's current medications.   Assessment/Plan:  Anal cancer Transanal excision of anal mass 10/14/2011, squamous cell carcinoma with positive surgical margins Radiation 11/19/2011 - 01/08/2012 Cycle one 5-FU/Mitomycin -C 07/2012, cycle two 5-FU/Mitomycin -C 12/15/2011 CT abdomen/pelvis 07/11/2023: Poorly defined hyperenhancing soft tissue at the anus, no pathology enlarged lymph nodes PET 12/22/2023: Diffuse nodular thickening with abnormal tracer uptake at the anal canal, right side and anteriorly, no evidence of metastatic disease, post surgical uptake in the sternum Examination under anesthesia 01/14/2024: Right anterior anal mass extending beyond the sphincter complex anteriorly and laterally, biopsy-invasive squamous of carcinoma Aortic valve replacement 10/14/2023 COPD Narcotic addiction-followed in a pain clinic Rectal bleeding and pain secondary to #1 6.   Peripheral vascular disease 7.   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   MSSA bacteremia February 2023    Disposition: Martin [name] has been diagnosed with locally recurrent anal cancer.  There is no clinical or radiologic evidence of distant metastatic disease.  He has pain and constipation.   The constipation is secondary to the anal mass and narcotic analgesics.  He will begin Senokot and MiraLAX for constipation.  He will continue methadone  and we added oxycodone /APAP (10/325) to use as needed for breakthrough pain.  Martin [name] indicates surgical resection of the anal tumor cannot be performed in [place].  I will refer him to the surgical oncology service at [facility].  I have available to deliver "neoadjuvant "therapy if the [name] surgeons feel this is indicated.  Mr Huck will return for an office visit in 2 weeks.  Coni Deep, MD  01/18/2024  4:17 PM

## 2024-01-18 NOTE — Patient Instructions (Signed)
 For bowels: MiraLax 17 grams every day Docusate sodium  100 mg twice daily (stool softener) Push fluids

## 2024-01-19 ENCOUNTER — Telehealth: Payer: Self-pay | Admitting: *Deleted

## 2024-01-19 ENCOUNTER — Telehealth: Payer: Self-pay | Admitting: Nurse Practitioner

## 2024-01-19 NOTE — Telephone Encounter (Signed)
 Faxed referral order, demographics and medical records to [facility]-Dr. Stitzeneberg [phone]. Also notified [name] to pick up if he gets a call from 27 number.

## 2024-01-19 NOTE — Telephone Encounter (Signed)
 Patient has been scheduled for follow-up visit per 01/18/24 LOS.  Pt aware of scheduled appt details.

## 2024-01-20 ENCOUNTER — Ambulatory Visit
Admission: RE | Admit: 2024-01-20 | Discharge: 2024-01-20 | Disposition: A | Discharge status: Home / Self Care | Source: Ambulatory Visit | Attending: General Surgery | Admitting: General Surgery

## 2024-01-20 DIAGNOSIS — K6289 Other specified diseases of anus and rectum: Secondary | ICD-10-CM

## 2024-02-03 ENCOUNTER — Inpatient Hospital Stay: Admitting: Nutrition

## 2024-02-03 ENCOUNTER — Inpatient Hospital Stay (HOSPITAL_BASED_OUTPATIENT_CLINIC_OR_DEPARTMENT_OTHER): Admitting: Nurse Practitioner

## 2024-02-03 ENCOUNTER — Encounter: Payer: Self-pay | Admitting: Nurse Practitioner

## 2024-02-03 VITALS — BP 117/67 | HR 73 | Temp 98.1°F | Resp 18 | Ht 71.0 in | Wt 132.2 lb

## 2024-02-03 DIAGNOSIS — C20 Malignant neoplasm of rectum: Secondary | ICD-10-CM | POA: Diagnosis not present

## 2024-02-03 DIAGNOSIS — C210 Malignant neoplasm of anus, unspecified: Secondary | ICD-10-CM

## 2024-02-03 MED ORDER — PROCHLORPERAZINE MALEATE 5 MG PO TABS: 5.0000 mg | ORAL_TABLET | Freq: Four times a day (QID) | ORAL | 0 refills | Status: DC | PRN

## 2024-02-03 MED ORDER — OXYCODONE-ACETAMINOPHEN 10-325 MG PO TABS: 1.0000 | ORAL_TABLET | ORAL | 0 refills | Status: DC | PRN

## 2024-02-03 NOTE — Progress Notes (Unsigned)
 [name] OFFICE PROGRESS NOTE   Diagnosis: Anal cancer  INTERVAL HISTORY:   Martin [name] returns as scheduled.  He saw Dr. Rachel Budds at [name] on 01/26/2024.  The recommendation is for abdominal perineal resection with a permanent end colostomy.  Bowels are moving.  Stool is soft with a flat appearance.  He intermittently notes blood.  He has intermittent fecal incontinence.  He takes Colace and MiraLAX.  Rectal pain is "tolerable" with oxycodone .  He is taking pain medicine 3-4 times a day.  He has occasional mild nausea.  He would like to have an antiemetic at home.  Reports appetite is "fine".    Objective:  Vital signs in last 24 hours:  Blood pressure 117/67, pulse 73, temperature 98.1 F (36.7 C), temperature source Temporal, resp. rate 18, height 5\' 11"  (1.803 m), weight 132 lb 3.2 oz (60 kg), SpO2 100%.    Resp: Distant breath sounds.  No respiratory distress. Cardio: Regular rate and rhythm. GI: No hepatosplenomegaly. Vascular: No leg edema.   Lab Results:  Lab Results  Component Value Date   WBC 10.7 (H) 01/15/2024   HGB 11.4 (L) 01/15/2024   HCT 35.1 (L) 01/15/2024   MCV 86.9 01/15/2024   PLT 142 (L) 01/15/2024   NEUTROABS 8.9 (H) 04/28/2022    Imaging:  No results found.  Medications: I have reviewed the patient's current medications.  Assessment/Plan: Anal cancer Transanal excision of anal mass 10/14/2011, squamous cell carcinoma with positive surgical margins Radiation 11/19/2011 - 01/08/2012 Cycle one 5-FU/Mitomycin -C 07/2012, cycle two 5-FU/Mitomycin -C 12/15/2011 CT abdomen/pelvis 07/11/2023: Poorly defined hyperenhancing soft tissue at the anus, no pathology enlarged lymph nodes PET 12/22/2023: Diffuse nodular thickening with abnormal tracer uptake at the anal canal, right side and anteriorly, no evidence of metastatic disease, post surgical uptake in the sternum Examination under anesthesia 01/14/2024: Right anterior anal mass extending beyond the  sphincter complex anteriorly and laterally, biopsy-invasive squamous of carcinoma 01/26/2024 Dr. 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   Peripheral vascular disease 7.   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   MSSA bacteremia February 2023  Disposition: Martin [name] appears stable.  He saw Dr. Rachel Budds at [name].  Recommendation is for upfront surgery with abdominal perineal resection with a permanent end colostomy.  Surgery appears to be tentatively scheduled on 03/02/2024.  He has significant pain related to the cancer.  Percocet prescription was refilled.  We also sent a prescription to his pharmacy for Compazine  5 mg every 6 hours as needed for nausea.  He is experiencing fecal incontinence due to the cancer.  Adult diapers are medically indicated.  We will submit a prescription for him.  He will return for follow-up 3 to 4 weeks after surgery.  We are available to see him sooner if needed.  Patient seen with Dr. Scherrie Curt.   Martin [name] ANP/GNP-BC   02/03/2024  3:12 PM  This was a shared visit with Martin [name].  Martin [name] saw Dr. Rachel Budds at [facility] on 01/26/2024.  Dr. Rachel Budds recommends proceeding with an APR unless the [facility] multidisciplinary tumor board recommends neoadjuvant therapy.  He has been referred for preoperative evaluations by cardiology plastic surgery.  We will see him following surgery.  I am available to see him sooner as needed.  I was present for  greater than 50% of today's visit.  I performed medical decision making.  Martin Kerns, MD

## 2024-02-03 NOTE — Progress Notes (Signed)
 63 year old male diagnosed with local recurrence of Anal Cancer and followed by Dr. Scherrie Curt. He saw Dr. Rachel Budds at [facility] on 01/26/2024.  The recommendation is for abdominal perineal resection with a permanent end colostomy.   PMH includes chemoradiation for anal cancer in 2013, Anxiety, DDD, COPD, GERD, Bowel resection, Hyperlipidemia, Hypertension, Illicit drug use, stroke.  Medications include Vitamin D , Zofran , Prednisone .  Labs reviewed.  Height: 5\' 11"  Weight: 132 pounds 3.2 oz. May 28. Weighed 139 pounds 12.8 oz December 30 UBW: 145-150 pounds. BMI: 18.44.  5% weight loss over 6 months, not clinically significant but concerning.   Patient has a good appetite. He is edentulous. He lives in a boarding house. He has access to a stove and has a microwave in his room. He mainly eats TV dinners at supper and may have a waffle for breakfast. He is trying to work so he may miss a meal. He eats a soft diet and tolerates macaroni and cheese, soups, canned pinto beans and frozen meals.  He enjoys Strawberry Ensure Plus but has difficulty affording them. Reports food insecurity and would appreciate a bag from the food pantry.  Has occasional nausea but no vomiting. Constipation is controlled on Colace.  Nutrition Diagnosis: Unintended weight loss related to cancer and associated treatments as evidenced by 5 % loss over 6 months.  Intervention: Educated on importance of increasing calories and protein in small frequent meals and snacks. Recommended Ensure Plus or equivalent three times daily between meals. Reviewed other cost effective foods to provide calories and protein.  Monitoring, Evaluation, Goals: Tolerate increased calories and protein to minimize weight loss. Improve nutrition status to prepare for surgery.  Next Visit: To be scheduled with upcoming treatments.

## 2024-02-04 ENCOUNTER — Telehealth: Payer: Self-pay

## 2024-02-04 ENCOUNTER — Encounter: Payer: Self-pay | Admitting: *Deleted

## 2024-02-04 ENCOUNTER — Telehealth: Payer: Self-pay | Admitting: Internal Medicine

## 2024-02-04 NOTE — Telephone Encounter (Signed)
 Pt scheduled to see Everett Hitt NP 6/9/258@1 :30pm. Colon tentatively scheduled with Dr Brice Campi in the [facility] 03/01/24@2pm . Please notify pt of appt.

## 2024-02-04 NOTE — Telephone Encounter (Signed)
 Good morning Dr. Bridgett Camps, I received a call from this [facility] requesting that this patient be seen in our office to discuss his colon due to him taking Plavix . Patient is scheduled with them for Surgery on June the 25th and they would like the patient to have a colonoscopy on the 24 th of June that way he can already be cleaned out for his surgery. Patient has not been seen with us  since 2016. Would you please advise on how to schedule this patient.   Thank you.

## 2024-02-04 NOTE — Telephone Encounter (Signed)
 Received phone call from [facility] at [facility] requesting to help patient schedule his colonoscopy due to surgery scheduled at the end of June-GB surgery.  Informed her that the referral was not in Epic yet, but after checking I did see where the referral was scanned but not yet entered in Epic.  She stated that the patient requested to have his colonoscopy in [place] and asked if we were located in [place].  Informed her that we are located in [place] and if Martin [name] wanted to go to [facility] his referral would need to be sent to [facility] located in [place].  Provided her with fax number to  GI and phone number.  Called her back to give her additional information lvm to let her know that Dr. Bridgett Camps performed last colonoscopy in 2016.  Thanks,  [place], CMA

## 2024-02-04 NOTE — Progress Notes (Signed)
 Faxed demographics, insurance info, office note and order for Underwear/pull-up diapers size small #180 with refills x 6 months to [facility] [phone].

## 2024-02-04 NOTE — Telephone Encounter (Signed)
 Chart reviewed, he is seen at [facility] and has had a recurrence of anal squamous cell cancer and needs further treatment They have requested cardiology clearance and colonoscopy prior to treatment of recurrent squamous cell anal cancer He has not been seen here since 2016  They are requesting that we perform a colonoscopy on 03/01/2024; I am not here that day.  He needs to be seen by an APP in the next 2 to 3 weeks Please hold a spot if 1 is available with any provider in the [facility] on 03/01/2024 for a possible colonoscopy; he would remain on clear liquids thereafter for surgery on 03/02/2024 at [facility]

## 2024-02-07 ENCOUNTER — Other Ambulatory Visit

## 2024-02-15 ENCOUNTER — Encounter: Payer: Self-pay | Admitting: Nurse Practitioner

## 2024-02-15 ENCOUNTER — Telehealth: Payer: Self-pay

## 2024-02-15 ENCOUNTER — Ambulatory Visit: Admitting: Nurse Practitioner

## 2024-02-15 ENCOUNTER — Other Ambulatory Visit: Payer: Self-pay | Admitting: Nurse Practitioner

## 2024-02-15 VITALS — BP 106/60 | HR 72 | Ht 71.0 in | Wt 129.5 lb

## 2024-02-15 DIAGNOSIS — I739 Peripheral vascular disease, unspecified: Secondary | ICD-10-CM | POA: Diagnosis not present

## 2024-02-15 DIAGNOSIS — D696 Thrombocytopenia, unspecified: Secondary | ICD-10-CM | POA: Diagnosis not present

## 2024-02-15 DIAGNOSIS — C210 Malignant neoplasm of anus, unspecified: Secondary | ICD-10-CM | POA: Diagnosis not present

## 2024-02-15 DIAGNOSIS — D649 Anemia, unspecified: Secondary | ICD-10-CM

## 2024-02-15 DIAGNOSIS — Z7902 Long term (current) use of antithrombotics/antiplatelets: Secondary | ICD-10-CM

## 2024-02-15 MED ORDER — OXYCODONE-ACETAMINOPHEN 10-325 MG PO TABS: 1.0000 | ORAL_TABLET | ORAL | 0 refills | Status: DC | PRN

## 2024-02-15 MED ORDER — NA SULFATE-K SULFATE-MG SULF 17.5-3.13-1.6 GM/177ML PO SOLN: 1.0000 | Freq: Once | ORAL | 0 refills | Status: AC

## 2024-02-15 NOTE — Progress Notes (Signed)
 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 PET scan 12/22/2023 showed evidence of recurrence without metastatic disease. Examination under anesthesia 01/14/2024 showed right anterior anal mass extending beyond the sphincter complex anteriorly and laterally, biopsy confirmed invasive squamous of carcinoma. He saw colorectal surgeon Dr. Rachel Budds at [facility] on 01/26/2024 who recommended an abdominal perineal resection with a permanent end colostomy. He is currently scheduled for a robotic laparoscopic abdominoperineal/proctectomy with colostomy 03/02/2024. A colonoscopy was requested to be completed on 03/01/2024 per his [facility] surgical team. He was tentatively scheduled for a colonoscopy as an outpatient in the [facility] with Dr. Brice Campi 03/01/2024.  He was seen by his cardiology PA at [facility] health 01/28/2024 and cardiac clearance for his upcoming surgery was received, patient documented to be low cardiac risk for surgery and okay to hold Plavix  for 5 days preop with instructions to restart as soon as possible.  Additional past surgical history includes left inguinal hernia repair with mesh placement 2013, appendectomy and a small bowel resection secondary to SBO complicated by a perforation with a contained abscess 01/22/2017.  Currently, he denies having any nausea or vomiting.  No upper or lower abdominal pain.  He is constipation on oxycodone  1 tab every 4-5 hours for rectal pain.  He is taking  Colace 100 mg daily and MiraLAX once daily which results in passing 1-2 soft formed bowel movements daily.  He intermittently sees bright red blood with his bowel movements and has intermittent fecal incontinence.  No GERD symptoms.  He sometimes has difficulty swallowing phlegm but no difficulty swallowing food, liquids or tablets.  His most recent colonoscopy was 11/2014 which showed evidence of radiation proctitis, discrete possible polyp at the dentate line without anal stenosis and diverticulosis in the descending and sigmoid colon.      Latest Ref Rng & Units 01/15/2024    9:30 PM 01/11/2024    2:14 PM 04/30/2022    5:15 AM  CBC  WBC 4.0 - 10.5 K/uL 10.7  10.7  8.9   Hemoglobin 13.0 - 17.0 g/dL 40.9  81.1  9.7   Hematocrit 39.0 - 52.0 % 35.1  37.1  30.8   Platelets 150 - 400 K/uL 142  168  153    MCV 86.9.   FOBT negative      Latest Ref Rng & Units 01/15/2024    9:30 PM 01/11/2024    2:14 PM 11/04/2022   11:13 AM  CMP  Glucose 70 - 99 mg/dL 95  914    BUN 8 - 23 mg/dL 7  8  11    Creatinine 0.61 - 1.24 mg/dL 7.82  9.56  2.13   Sodium 135 - 145 mmol/L 138  134    Potassium 3.5 - 5.1 mmol/L 3.8  4.1    Chloride 98 - 111 mmol/L 105  104    CO2 22 - 32 mmol/L 26  24    Calcium  8.9 - 10.3 mg/dL 9.2  9.0    Total Protein 6.5 - 8.1 g/dL 7.1  6.8  Total Bilirubin 0.0 - 1.2 mg/dL 0.4  0.8    Alkaline Phos 38 - 126 U/L 125  134    AST 15 - 41 U/L 23  23    ALT 0 - 44 U/L 16  18      PAST GI PROCEDURES:  Colonoscopy 11/27/2014 by Dr. Bridgett Camps: 1. There was mild diverticulosis noted in the descending colon and sigmoid colon  2. The examination was otherwise normal of the colon  3. Mild radiation proctitis (nonbleeding), internal hemorrhoids, discrete possible polyp at dentate line. No anal stenosis.  Colonoscopy 04/27/2012:   EGD 04/27/2012:   1. Surgical [P], gastric, bx - BENIGN GASTRIC BODY AND ANTRAL TYPE MUCOSA. - NO EVIDENCE OF HELICOBACTER PYLORI, INTESTINAL METAPLASIA,  DYSPLASIA, OR MALIGNANCY. 2. Surgical [P], random colon, bx - LYMPHOCYTIC COLITIS. PLEASE SEE COMMENT. 3. Surgical [P], rectosigmoid, polyp - HYPERPLASTIC POLYP. NO ADENOMATOUS CHANGE OR MALIGNANCY IDENTIFIED (ONE FRAGMENT).  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    Anxiety    Arthritis    DDD lumbar, arthritis knees   Complication of anesthesia    pt states he woke up during a vascular procedure   COPD (chronic obstructive pulmonary disease) (HCC)    DJD (degenerative joint disease) of lumbar spine    GERD (gastroesophageal reflux disease)    Hemorrhoid    internal   History of bowel resection 04/19/2017   Hyperlipidemia    Hypertension    Pt states he's never been told or treated for HTN   Illicit drug use    + UDS cocaine, THC, opiates 10/11/21   Inguinal hernia    MSSA bacteremia 10/11/2021   s/p IV cefazolin , po Linezolid ; 10/16/21 TEE w/o vegetations.   Peripheral vascular disease (HCC)    Pneumonia    as child, cough at present with no fever   Radiation 11/19/11-01/08/12   5040 cGy 28 fx Pelvis and inguinal area   Stroke [facility])    TIA,  no weakness or paralysis   Substance abuse (HCC)    last use was Jan. 2024; patient states he has "been clean" since then   Past Surgical History:  Procedure Laterality Date   APPENDECTOMY     age 64 or 63   CARPAL TUNNEL RELEASE Right 02/04/2023   Procedure: RIGHT CARPAL TUNNEL RELEASE;  Surgeon: Wes Hamman, MD;  Location: [facility];  Service: Orthopedics;  Laterality: Right;   EXAMINATION UNDER ANESTHESIA  10/14/2011   Procedure: EXAM UNDER ANESTHESIA;  Surgeon: Keitha Pata, MD;  Location: [facility];  Service: General;  Laterality: N/A;  exam under anethesia, Excision of mass anal canal, 1.5cm   INGUINAL HERNIA REPAIR  05/11/2012   Procedure: HERNIA REPAIR INGUINAL ADULT;  Surgeon: Keitha Pata, MD;  Location: [facility];  Service: General;  Laterality: Left;  left inguinal hernia repair with mesh   LOWER EXTREMITY ANGIOGRAPHY Left 07/02/2021   Procedure: LOWER EXTREMITY ANGIOGRAPHY;  Surgeon: Jackquelyn Mass, MD;  Location: [facility];  Service: Cardiovascular;  Laterality: Left;   LOWER EXTREMITY ANGIOGRAPHY Right 07/16/2021   Procedure: LOWER EXTREMITY ANGIOGRAPHY;  Surgeon: Jackquelyn Mass, MD;  Location: [facility];  Service: Cardiovascular;  Laterality: Right;   LOWER EXTREMITY ANGIOGRAPHY Left 08/27/2021   Procedure: LOWER EXTREMITY ANGIOGRAPHY;  Surgeon: Jackquelyn Mass, MD;  Location: [facility];  Service: Cardiovascular;  Laterality: Left;   LOWER EXTREMITY ANGIOGRAPHY Left 01/14/2022   Procedure: Lower Extremity Angiography;  Surgeon: Jackquelyn Mass, MD;  Location: [facility];  Service: Cardiovascular;  Laterality: Left;   LOWER EXTREMITY ANGIOGRAPHY Left 11/04/2022   Procedure: Lower Extremity Angiography;  Surgeon: Jackquelyn Mass, MD;  Location: [facility];  Service: Cardiovascular;  Laterality: Left;   RECTAL BIOPSY N/A 01/14/2024   Procedure: CORE NEEDLE BIOPSY ANAL MASS;  Surgeon: Joyce Nixon, MD;  Location: [facility];  Service: General;  Laterality: N/A;   RECTAL EXAM UNDER ANESTHESIA N/A 01/14/2024   Procedure: EXAM UNDER ANESTHESIA, RECTUM;  Surgeon: Joyce Nixon, MD;  Location: [facility];  Service: General;  Laterality: N/A;   SMALL INTESTINE SURGERY  04/19/2017   exploratory lap, partial small bowel resection for perforated ulcer/abcess, radiation enteritis   surgical pathology   10/14/2011   squamous cell ca of anus   TEE WITHOUT CARDIOVERSION N/A 10/16/2021   Procedure: TRANSESOPHAGEAL ECHOCARDIOGRAM (TEE);  Surgeon: Hazle Lites, MD;  Location: [facility];  Service: Cardiovascular;  Laterality: N/A;   TOOTH EXTRACTION N/A 12/04/2021   Procedure: DENTAL RESTORATION/EXTRACTIONS;  Surgeon: Ascencion Lava, DMD;   Location: [facility];  Service: Oral Surgery;  Laterality: N/A;   transanal excision  10/14/2011   Dr.Todd Gerkin   Social History: He reports that he has been smoking cigarettes. He has a 47 pack-year smoking history. He has never used smokeless tobacco. He reports current alcohol use of about 1.0 standard drink of alcohol per week. He reports that he does not currently use drugs after having used the following drugs: Marijuana and "Crack" cocaine.  Family History: Father with history of hypertension and colon cancer. Mother with asthma and hypertension.   Allergies  Allergen Reactions   Aspirin  Swelling    Lip swelling   Yellow Jacket Venom [Bee Venom] Anaphylaxis   Zolpidem  Other (See Comments)   Codeine Nausea Only   Ibuprofen      On blood thinners   Morphine  And Codeine Itching      Outpatient Encounter Medications as of 02/15/2024  Medication Sig   albuterol  (VENTOLIN  HFA) 108 (90 Base) MCG/ACT inhaler Inhale 2 puffs into the lungs every 6 (six) hours as needed for wheezing or shortness of breath.   atorvastatin  (LIPITOR) 40 MG tablet Take 40 mg by mouth daily.   busPIRone (BUSPAR) 5 MG tablet Take 5 mg by mouth 2 (two) times daily.   carvedilol (COREG) 3.125 MG tablet Take 3.125 mg by mouth 2 (two) times daily with a meal.   cloNIDine  (CATAPRES ) 0.2 MG tablet Take 0.2 mg by mouth at bedtime.   clopidogrel  (PLAVIX ) 75 MG tablet Take 1 tablet (75 mg total) by mouth daily.   docusate sodium  (COLACE) 100 MG capsule Take 100 mg by mouth daily as needed for mild constipation.   EPINEPHrine  (EPIPEN  2-PAK) 0.3 mg/0.3 mL IJ SOAJ injection Inject 0.3 mg (1 pen) into the muscle as needed for anaphylaxis. (Patient not taking: Reported on 01/18/2024)   losartan (COZAAR) 25 MG tablet Take 25 mg by mouth daily.   methadone  (DOLOPHINE ) 1 MG/1ML solution Take 36 mg by mouth daily.   oxyCODONE -acetaminophen  (PERCOCET) 10-325 MG tablet Take 1 tablet by mouth every 4 (four) hours as needed for pain.    prochlorperazine  (COMPAZINE ) 5 MG tablet Take 1 tablet (5 mg total) by mouth every 6 (six) hours as needed for nausea or vomiting.   SPIRIVA RESPIMAT 2.5 MCG/ACT AERS Inhale 2  puffs into the lungs daily.   tamsulosin  (FLOMAX ) 0.4 MG CAPS capsule Take 1 capsule (0.4 mg total) by mouth at bedtime. (Patient taking differently: Take 0.8 mg by mouth in the morning.)   traZODone  (DESYREL ) 50 MG tablet Take 50 mg by mouth at bedtime.   Vitamin D , Cholecalciferol, 25 MCG (1000 UT) TABS Take 1,000 Units by mouth daily.   [DISCONTINUED] sucralfate  (CARAFATE ) 1 GM/10ML suspension Take 10 mLs (1 g total) by mouth 4 (four) times daily -  with meals and at bedtime. (Patient not taking: Reported on 11/28/2019)   No facility-administered encounter medications on file as of 02/15/2024.   REVIEW OF SYSTEMS:  Gen: Denies fever, sweats or chills. + Weight loss. CV: Denies chest pain, palpitations or edema. Resp: Denies cough, shortness of breath of hemoptysis.  GI: See HPI. GU: Denies urinary burning, blood in urine, increased urinary frequency or incontinence. MS: Denies joint pain, muscles aches or weakness. Derm: Denies rash, itchiness, skin lesions or unhealing ulcers. Psych: Denies depression, anxiety, memory loss or confusion. Heme: Denies bruising, easy bleeding. Neuro:  Denies headaches, dizziness or paresthesias. Endo:  Denies any problems with DM, thyroid or adrenal function.  PHYSICAL EXAM: BP 106/60 (BP Location: Left Arm, Patient Position: Sitting, Cuff Size: Normal)   Pulse 72   Ht 5\' 11"  (1.803 m)   Wt 129 lb 8 oz (58.7 kg)   BMI 18.06 kg/m   General: Thin 63 year old male in no acute distress. Head: Normocephalic and atraumatic. Eyes:  Sclerae non-icteric, conjunctive pink. Ears: Normal auditory acuity. Mouth: Dentition intact. No ulcers or lesions.  Neck: Supple, no lymphadenopathy or thyromegaly.  Lungs: Clear bilaterally to auscultation without wheezes, crackles or rhonchi. Heart:  Regular rate and rhythm. No murmur, rub or gallop appreciated.  Abdomen: Soft, nontender, nondistended. No masses. No hepatosplenomegaly. Normoactive bowel sounds x 4 quadrants.  Rectal: Deferred.  Musculoskeletal: Symmetrical with no gross deformities. Skin: Warm and dry. No rash or lesions on visible extremities. Extremities: No edema. Neurological: Alert oriented x 4, no focal deficits.  Psychological: Alert and cooperative. Normal mood and affect.  ASSESSMENT AND PLAN:  63 year old male with a history of anal cancer initially diagnosed 10/2011 treated with radiation and chemo. PET scan 12/22/2023 showed evidence of recurrence without metastatic disease. Anal mass biopsy 01/14/2024 confirmed invasive squamous of carcinoma.  Patient is scheduled for abdominoperineal resection with permanent end colostomy at [facility] on 03/02/2024.  A colonoscopy 1 day preop was requested by his [facility] surgical team. Cardiac clearance with Plavix  hold instructions x 5 days prior to surgery was provided by his cardiology NP at [facility] health. - Patient scheduled for a colonoscopy in [facility] with Dr. Brice Campi on 03/01/2024 - Our office will contact his cardiologist to verify holding Plavix  for 5 days prior to his colonoscopy date 03/01/2024, will need to hold Plavix  for surgery date 03/02/2024 as well.  -Patient stated he was having a labs including a  CBC in 1 week at [facility]  S/P AVR 10/14/2023 on Plavix  (for bovine tissue valve, intolerant to ASA)  CVA 2018  PVD on Plavix    COPD  Normocytic anemia.  Hemoglobin 10.7.  Thrombocytopenia.  Platelet count 142.  CC:  Nguyen, Kim, NP

## 2024-02-15 NOTE — Telephone Encounter (Signed)
 Patient called and request a refill  Perocet 10-325 mg, I placed the request on the np's desk.

## 2024-02-15 NOTE — Telephone Encounter (Signed)
     [facility] [address] [place], [place]  [address] Phone:  [phone]   Fax:  [phone]     1961/04/21 [id number]  02/15/24   Dear Bartholome Boron, PA-C:  We have scheduled the above named patient for a colonoscopy procedure. Our records show that (s)he is on anticoagulation therapy.  Please advise as to whether the patient may come off their therapy of Plavix  5 days prior to their procedure which is scheduled for 03/01/24.  Please note that he has surgery scheduled for 03/02/24 also.  Please route your response to [facility], CMA or fax response to [phone].  Sincerely,    [facility]

## 2024-02-15 NOTE — Telephone Encounter (Signed)
   [facility] [address] [place], [place]  [address] Phone:  [phone]   Fax:  [phone]      Martin [name] [birth year] [id number]  02/15/2024   Dear Celester Colace. Brent Cambric, PA-C:  We have scheduled the above named patient for a colonoscopy procedure. Our records show that (s)he is on anticoagulation therapy.  Please advise as to whether the patient may come off their therapy of Plavix  5 days prior to their procedure which is scheduled for 03/01/24.  Please note, he is scheduled for surgery on 03/02/24 also at [facility].  Please route your response to [facility], CMA or fax response to [phone].  Sincerely,     Gastroenterology

## 2024-02-15 NOTE — Patient Instructions (Signed)
 You have been scheduled for a colonoscopy. Please follow written instructions given to you at your visit today.   If you use inhalers (even only as needed), please bring them with you on the day of your procedure.  DO NOT TAKE 7 DAYS PRIOR TO TEST- Trulicity (dulaglutide) Ozempic, Wegovy (semaglutide) Mounjaro (tirzepatide) Bydureon Bcise (exanatide extended release)  DO NOT TAKE 1 DAY PRIOR TO YOUR TEST Rybelsus (semaglutide) Adlyxin (lixisenatide) Victoza (liraglutide) Byetta (exanatide) ___________________________________________________________________________  Thank you for trusting me with your gastrointestinal care!   Everett Hitt, CRNP    _______________________________________________________  If your blood pressure at your visit was 140/90 or greater, please contact your primary care physician to follow up on this.  _______________________________________________________  If you are age 36 or older, your body mass index should be between 23-30. Your Body mass index is 18.06 kg/m. If this is out of the aforementioned range listed, please consider follow up with your Primary Care Provider.  If you are age 45 or younger, your body mass index should be between 19-25. Your Body mass index is 18.06 kg/m. If this is out of the aformentioned range listed, please consider follow up with your Primary Care Provider.   ________________________________________________________  The [facility] providers would like to encourage you to use MYCHART to communicate with providers for non-urgent requests or questions.  Due to long hold times on the telephone, sending your provider a message by [facility] may be a faster and more efficient way to get a response.  Please allow 48 business hours for a response.  Please remember that this is for non-urgent requests.  _______________________________________________________

## 2024-02-16 NOTE — Progress Notes (Signed)
 Addendum: Reviewed and agree with assessment and management plan. Asha Grumbine, Carie Caddy, MD

## 2024-02-16 NOTE — Progress Notes (Signed)
 I am happy to be available for this patient's colonoscopy.

## 2024-02-18 ENCOUNTER — Ambulatory Visit: Admitting: Nurse Practitioner

## 2024-02-19 NOTE — Telephone Encounter (Signed)
 Fax received stating that the colonoscopy procedure is considered low risk and [name] may hold his Plavix  for 5 days prior to his procedure and resume as soon as risk of bleeding has passed.

## 2024-02-25 ENCOUNTER — Telehealth: Payer: Self-pay | Admitting: *Deleted

## 2024-02-25 NOTE — Telephone Encounter (Signed)
 Martin [name] left VM requesting refill on his oxycodone -apap

## 2024-02-26 ENCOUNTER — Other Ambulatory Visit: Payer: Self-pay | Admitting: Nurse Practitioner

## 2024-02-26 DIAGNOSIS — C210 Malignant neoplasm of anus, unspecified: Secondary | ICD-10-CM

## 2024-02-26 MED ORDER — OXYCODONE-ACETAMINOPHEN 10-325 MG PO TABS: 1.0000 | ORAL_TABLET | ORAL | 0 refills | Status: DC | PRN

## 2024-02-26 NOTE — Telephone Encounter (Signed)
 LVM that script sent to his [facility] today

## 2024-02-29 ENCOUNTER — Telehealth: Payer: Self-pay | Admitting: Gastroenterology

## 2024-02-29 NOTE — Telephone Encounter (Signed)
 I called the pt I will send them

## 2024-02-29 NOTE — Telephone Encounter (Signed)
 FYI, further review showed Suprep bowel prep RX was sent to his pharmacy on 02/15/2024, day of office visit

## 2024-02-29 NOTE — Telephone Encounter (Signed)
 Inbound call from patient, states when he was scheduled for his colonoscopy, he never received his instructions. Patient is unsure of prep, diet and also prep times. Would like to speak to a nurse to further advise.

## 2024-02-29 NOTE — Telephone Encounter (Signed)
 Martin [name], thank you. Not sure why patient did not receive bowel prep instructions at time of office appointment.

## 2024-03-01 ENCOUNTER — Encounter: Payer: Self-pay | Admitting: Gastroenterology

## 2024-03-01 ENCOUNTER — Ambulatory Visit: Admitting: Gastroenterology

## 2024-03-01 VITALS — BP 131/65 | HR 61 | Temp 98.4°F | Resp 13 | Ht 71.0 in | Wt 129.0 lb

## 2024-03-01 DIAGNOSIS — Z1211 Encounter for screening for malignant neoplasm of colon: Secondary | ICD-10-CM | POA: Diagnosis not present

## 2024-03-01 DIAGNOSIS — K5730 Diverticulosis of large intestine without perforation or abscess without bleeding: Secondary | ICD-10-CM

## 2024-03-01 DIAGNOSIS — K644 Residual hemorrhoidal skin tags: Secondary | ICD-10-CM

## 2024-03-01 DIAGNOSIS — D127 Benign neoplasm of rectosigmoid junction: Secondary | ICD-10-CM

## 2024-03-01 DIAGNOSIS — K56600 Partial intestinal obstruction, unspecified as to cause: Secondary | ICD-10-CM | POA: Diagnosis not present

## 2024-03-01 DIAGNOSIS — K624 Stenosis of anus and rectum: Secondary | ICD-10-CM

## 2024-03-01 DIAGNOSIS — Q439 Congenital malformation of intestine, unspecified: Secondary | ICD-10-CM

## 2024-03-01 DIAGNOSIS — D123 Benign neoplasm of transverse colon: Secondary | ICD-10-CM

## 2024-03-01 DIAGNOSIS — C210 Malignant neoplasm of anus, unspecified: Secondary | ICD-10-CM

## 2024-03-01 DIAGNOSIS — Z01818 Encounter for other preprocedural examination: Secondary | ICD-10-CM

## 2024-03-01 MED ORDER — FLEET ENEMA RE ENEM
2.0000 | ENEMA | Freq: Once | RECTAL | Status: AC
  Administered 2024-03-01: 2 via RECTAL

## 2024-03-01 MED ORDER — SODIUM CHLORIDE 0.9 % IV SOLN: 500.0000 mL | Freq: Once | INTRAVENOUS | Status: DC

## 2024-03-01 NOTE — Progress Notes (Signed)
 Pt's states no medical or surgical changes since previsit or office visit.

## 2024-03-01 NOTE — Op Note (Signed)
 [facility] Patient Name: Martin [name] Procedure Date: 03/01/2024 3:20 PM MRN: [id number] Endoscopist: Aloha Finner , MD, [id number] Age: 63 Referring MD:  Date of Birth: 24-Mar-1961 Gender: Male Account #: [id number] Procedure:                Colonoscopy Indications:              High risk colon cancer surveillance: Personal                            history of anal cancer, Incidental - Preoperative                            assessment Medicines:                Monitored Anesthesia Care Procedure:                Pre-Anesthesia Assessment:                           - Prior to the procedure, a History and Physical                            was performed, and patient medications and                            allergies were reviewed. The patient's tolerance of                            previous anesthesia was also reviewed. The risks                            and benefits of the procedure and the sedation                            options and risks were discussed with the patient.                            All questions were answered, and informed consent                            was obtained. Prior Anticoagulants: The patient has                            taken Plavix  (clopidogrel ), last dose was 5 days                            prior to procedure. ASA Grade Assessment: III - A                            patient with severe systemic disease. After                            reviewing the risks and benefits, the patient was  deemed in satisfactory condition to undergo the                            procedure.                           After obtaining informed consent, the colonoscope                            was passed under direct vision. Throughout the                            procedure, the patient's blood pressure, pulse, and                            oxygen saturations were monitored continuously. The                             Olympus Scope SN [id number] was introduced through the                            anus and advanced to the the sigmoid colon. The                            PCF-H190TL Slim SN [id number] was introduced through                            the anus and advanced to the the cecum, identified                            by appendiceal orifice and ileocecal valve. The                            colonoscopy was technically difficult and complex                            due to restricted mobility of the colon and a                            tortuous colon. Successful completion of the                            procedure was aided by changing the patient's                            position, using manual pressure, withdrawing and                            reinserting the scope, withdrawing the scope and                            replacing eventually with the UltraSlim  colonoscope, straightening and shortening the scope                            to obtain bowel loop reduction and using scope                            torsion. The patient tolerated the procedure. The                            quality of the bowel preparation was inadequate.                            The ileocecal valve, appendiceal orifice, and                            rectum were photographed. Scope In: 3:30:58 PM Scope Out: 3:53:43 PM Scope Withdrawal Time: 0 hours 15 minutes 22 seconds  Total Procedure Duration: 0 hours 22 minutes 45 seconds  Findings:                 The digital rectal exam findings include palpable                            anorectal mass (from 12 o'clock to 6 o'clock                            position in left lateral positioning), hemorrhoids                            and anorectal stenosis as a result of the excavated                            mass.                           Multiple small-mouthed diverticula were found in                            the recto-sigmoid  colon, sigmoid colon and                            descending colon.                           The sigmoid colon was grossly tortuous with                            significant restriction in mobility.                           Two sessile polyps were found in the recto-sigmoid                            colon and transverse colon. The polyps were 3 to 6  mm in size. These polyps were removed with a cold                            snare. Resection and retrieval were complete.                           Copious quantities of semi-liquid stool was found                            in the entire colon, interfering with                            visualization. Lavage of the area was performed                            using copious amounts, resulting in incomplete                            clearance with fair visualization.                           A fungating, infiltrative and ulcerated partially                            obstructing large mass was found at the anus                            extending into the distal rectum. The mass was                            partially circumferential (involving one-half of                            the lumen circumference). The mass measured four cm                            in length. Oozing was present. Complications:            No immediate complications. Estimated Blood Loss:     Estimated blood loss was minimal. Impression:               - Stool in the entire examined colon. Even after                            copious lavage Preparation of the colon was                            inadequate.                           - Palpable anorectal excavated mass, hemorrhoids                            and rectal stricture found on digital rectal exam.                           -  Diverticulosis in the recto-sigmoid colon, in the                            sigmoid colon and in the descending colon. This led                             to significant restriction in mobility and                            tortuousity (requiring transition to ultraslim                            colonoscope).                           - Two 3 to 6 mm polyps at the recto-sigmoid colon                            and in the transverse colon, removed with a cold                            snare. Resected and retrieved.                           - Malignant partially obstructing tumor at the anus                            and in the distal rectum. Recommendation:           - The patient will be observed post-procedure,                            until all discharge criteria are met.                           - Discharge patient to home.                           - Patient has a contact number available for                            emergencies. The signs and symptoms of potential                            delayed complications were discussed with the                            patient. Return to normal activities tomorrow.                            Written discharge instructions were provided to the                            patient.                           -  Clear liquid diet.                           - Initiate colon preparation for procedure/surgery                            scheduled for 6/25 at [facility], as per their                            protocol/timing.                           - Await pathology results.                           - Repeat colonoscopy within the next 6 to 12 months                            because the bowel preparation was suboptimal. If                            patient ends up having ostomy, this can be pursued                            earlier, based on his surgical oncology team.                           - Although the preparation was inadequate,                            visualization was fair and I do not see any                            evidence of any overt large masses/lesions in the                             proximal colon (though flat lesions could be missed                            as result of the significant inadequate preparation.                           - I do not necessarily see a contraindication to                            him forward with procedure scheduled tomorrow from                            a surgical standpoint however.                           - The findings and recommendations were discussed  with the patient.                           - The findings and recommendations were discussed                            with the patient's family. Aloha Finner, MD 03/01/2024 4:06:57 PM

## 2024-03-01 NOTE — Patient Instructions (Addendum)
 - 2 polyps removed and diverticulosis                          - The patient will be observed post-procedure,    until all discharge criteria are met. - Discharge patient to home. - Clear liquid diet. - Initiate colon preparation for procedure/surgery    scheduled for 6/25 at [facility], as per their    protocol/timing. - Await pathology results. - Repeat colonoscopy within the next 6 to 12 months    because the bowel preparation was suboptimal. If    patient ends up having ostomy, this can be pursued    earlier, based on his surgical oncology team. - Although the preparation was inadequate,    visualization was fair and I do not see any    evidence of any overt large masses/lesions in the    proximal colon (though flat lesions could be missed    as result of the significant inadequate preparation. - I do not necessarily see a contraindication to    him forward with procedure scheduled tomorrow from    a surgical standpoint however.  YOU HAD AN ENDOSCOPIC PROCEDURE TODAY AT THE [facility]:   Refer to the procedure report that was given to you for any specific questions about what was found during the examination.  If the procedure report does not answer your questions, please call your gastroenterologist to clarify.  If you requested that your care partner not be given the details of your procedure findings, then the procedure report has been included in a sealed envelope for you to review at your convenience later.  YOU SHOULD EXPECT: Some feelings of bloating in the abdomen. Passage of more gas than usual.  Walking can help get rid of the air that was put into your GI tract during the procedure and reduce the bloating. If you had a lower endoscopy (such as a colonoscopy or flexible sigmoidoscopy) you may notice spotting of blood in your stool or on the toilet paper. If you underwent a bowel prep for your procedure, you may not have a normal bowel movement for a few days.  Please  Note:  You might notice some irritation and congestion in your nose or some drainage.  This is from the oxygen used during your procedure.  There is no need for concern and it should clear up in a day or so.  SYMPTOMS TO REPORT IMMEDIATELY:  Following lower endoscopy (colonoscopy or flexible sigmoidoscopy):  Excessive amounts of blood in the stool  Significant tenderness or worsening of abdominal pains  Swelling of the abdomen that is new, acute  Fever of 100F or higher   For urgent or emergent issues, a gastroenterologist can be reached at any hour by calling [phone]. Do not use MyChart messaging for urgent concerns.    DIET:  We do recommend a small meal at first, but then you may proceed to your regular diet.  Drink plenty of fluids but you should avoid alcoholic beverages for 24 hours.  ACTIVITY:  You should plan to take it easy for the rest of today and you should NOT DRIVE or use heavy machinery until tomorrow (because of the sedation medicines used during the test).    FOLLOW UP: Our staff will call the number listed on your records the next business day following your procedure.  We will call around 7:15- 8:00 am to check on you and address any questions or  concerns that you may have regarding the information given to you following your procedure. If we do not reach you, we will leave a message.     If any biopsies were taken you will be contacted by phone or by letter within the next 1-3 weeks.  Please call us  at [phone] if you have not heard about the biopsies in 3 weeks.    SIGNATURES/CONFIDENTIALITY: You and/or your care partner have signed paperwork which will be entered into your electronic medical record.  These signatures attest to the fact that that the information above on your After Visit Summary has been reviewed and is understood.  Full responsibility of the confidentiality of this discharge information lies with you and/or your care-partner.

## 2024-03-01 NOTE — Progress Notes (Signed)
 Report given to PACU, vss

## 2024-03-01 NOTE — Progress Notes (Signed)
 Called to room to assist during endoscopic procedure.  Patient ID and intended procedure confirmed with present staff. Received instructions for my participation in the procedure from the performing physician.

## 2024-03-01 NOTE — Progress Notes (Signed)
 2 enema given in admitting.

## 2024-03-01 NOTE — Progress Notes (Signed)
 GASTROENTEROLOGY PROCEDURE H&P NOTE   Primary Care Physician: Leontine Cramp, NP  HPI: Martin [name] is a 63 y.o. male who presents for Colonoscopy for anal cancer prior to surgery.  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    Anxiety    Arthritis    DDD lumbar, arthritis knees   Complication of anesthesia    pt states he woke up during a vascular procedure   COPD (chronic obstructive pulmonary disease) (HCC)    DJD (degenerative joint disease) of lumbar spine    GERD (gastroesophageal reflux disease)    Hemorrhoid    internal   History of bowel resection 04/19/2017   Hyperlipidemia    Hypertension    Pt states he's never been told or treated for HTN   Illicit drug use    + UDS cocaine, THC, opiates 10/11/21   Inguinal hernia    MSSA bacteremia 10/11/2021   s/p IV cefazolin , po Linezolid ; 10/16/21 TEE w/o vegetations.   Peripheral vascular disease (HCC)    Pneumonia    as child, cough at present with no fever   Radiation 11/19/11-01/08/12   5040 cGy 28 fx Pelvis and inguinal area   Stroke [facility])    TIA,  no weakness or paralysis   Substance abuse (HCC)    last use was Jan. 2024; patient states he has been clean since then   Past Surgical History:  Procedure Laterality Date   APPENDECTOMY     age 48 or 23   CARPAL TUNNEL RELEASE Right 02/04/2023   Procedure: RIGHT CARPAL TUNNEL RELEASE;  Surgeon: Jerri Kay HERO, MD;  Location: [facility];  Service: Orthopedics;  Laterality: Right;   EXAMINATION UNDER ANESTHESIA  10/14/2011   Procedure: EXAM UNDER ANESTHESIA;  Surgeon: Krystal HERO Spinner, MD;  Location: [facility];  Service: General;  Laterality: N/A;  exam under anethesia, Excision of mass anal canal, 1.5cm   INGUINAL HERNIA REPAIR  05/11/2012   Procedure: HERNIA REPAIR INGUINAL ADULT;  Surgeon: Krystal HERO Spinner, MD;  Location: [facility];  Service: General;   Laterality: Left;  left inguinal hernia repair with mesh   LOWER EXTREMITY ANGIOGRAPHY Left 07/02/2021   Procedure: LOWER EXTREMITY ANGIOGRAPHY;  Surgeon: Jama Cordella MATSU, MD;  Location: [facility];  Service: Cardiovascular;  Laterality: Left;   LOWER EXTREMITY ANGIOGRAPHY Right 07/16/2021   Procedure: LOWER EXTREMITY ANGIOGRAPHY;  Surgeon: Jama Cordella MATSU, MD;  Location: [facility];  Service: Cardiovascular;  Laterality: Right;   LOWER EXTREMITY ANGIOGRAPHY Left 08/27/2021   Procedure: LOWER EXTREMITY ANGIOGRAPHY;  Surgeon: Jama Cordella MATSU, MD;  Location: [facility];  Service: Cardiovascular;  Laterality: Left;   LOWER EXTREMITY ANGIOGRAPHY Left 01/14/2022   Procedure: Lower Extremity Angiography;  Surgeon: Jama Cordella MATSU, MD;  Location: [facility];  Service: Cardiovascular;  Laterality: Left;   LOWER EXTREMITY ANGIOGRAPHY Left 11/04/2022   Procedure: Lower Extremity Angiography;  Surgeon: Jama Cordella MATSU, MD;  Location: [facility];  Service: Cardiovascular;  Laterality: Left;   RECTAL BIOPSY N/A 01/14/2024   Procedure: CORE NEEDLE BIOPSY ANAL MASS;  Surgeon: Debby Hila, MD;  Location: [facility];  Service: General;  Laterality: N/A;   RECTAL EXAM UNDER ANESTHESIA N/A 01/14/2024   Procedure: EXAM UNDER ANESTHESIA, RECTUM;  Surgeon: Debby Hila, MD;  Location: [facility];  Service: General;  Laterality:  N/A;   SMALL INTESTINE SURGERY  04/19/2017   exploratory lap, partial small bowel resection for perforated ulcer/abcess, radiation enteritis   surgical pathology   10/14/2011   squamous cell ca of anus   TEE WITHOUT CARDIOVERSION N/A 10/16/2021   Procedure: TRANSESOPHAGEAL ECHOCARDIOGRAM (TEE);  Surgeon: Mona Vinie BROCKS, MD;  Location: [facility];  Service: Cardiovascular;  Laterality: N/A;   TOOTH EXTRACTION N/A 12/04/2021   Procedure: DENTAL RESTORATION/EXTRACTIONS;  Surgeon: Sheryle Hamilton, DMD;  Location: [facility];  Service: Oral  Surgery;  Laterality: N/A;   transanal excision  10/14/2011   Dr.Todd Gerkin   Current Outpatient Medications  Medication Sig Dispense Refill   atorvastatin  (LIPITOR) 40 MG tablet Take 40 mg by mouth daily.     busPIRone (BUSPAR) 5 MG tablet Take 5 mg by mouth 2 (two) times daily.     carvedilol (COREG) 3.125 MG tablet Take 3.125 mg by mouth 2 (two) times daily with a meal.     ciprofloxacin  (CIPRO ) 500 MG tablet Take 500mg  (1 tablet) at 2pm and 10pm the day before surgery     docusate sodium  (COLACE) 100 MG capsule Take 100 mg by mouth daily as needed for mild constipation.     losartan (COZAAR) 25 MG tablet Take 25 mg by mouth daily.     methadone  (DOLOPHINE ) 1 MG/1ML solution Take 36 mg by mouth daily.     oxyCODONE -acetaminophen  (PERCOCET) 10-325 MG tablet Take 1 tablet by mouth every 4 (four) hours as needed for pain. 60 tablet 0   SPIRIVA RESPIMAT 2.5 MCG/ACT AERS Inhale 2 puffs into the lungs daily.     tamsulosin  (FLOMAX ) 0.4 MG CAPS capsule Take 1 capsule (0.4 mg total) by mouth at bedtime. (Patient taking differently: Take 0.8 mg by mouth in the morning.) 90 capsule 1   traZODone  (DESYREL ) 50 MG tablet Take 50 mg by mouth at bedtime.     Vitamin D , Cholecalciferol, 25 MCG (1000 UT) TABS Take 1,000 Units by mouth daily.     albuterol  (VENTOLIN  HFA) 108 (90 Base) MCG/ACT inhaler Inhale 2 puffs into the lungs every 6 (six) hours as needed for wheezing or shortness of breath. 18 g 0   cloNIDine  (CATAPRES ) 0.2 MG tablet Take 0.2 mg by mouth at bedtime. (Patient not taking: Reported on 02/15/2024)     clopidogrel  (PLAVIX ) 75 MG tablet Take 1 tablet (75 mg total) by mouth daily. 30 tablet 11   EPINEPHrine  (EPIPEN  2-PAK) 0.3 mg/0.3 mL IJ SOAJ injection Inject 0.3 mg (1 pen) into the muscle as needed for anaphylaxis. (Patient not taking: Reported on 01/18/2024) 2 each 2   prochlorperazine  (COMPAZINE ) 5 MG tablet Take 1 tablet (5 mg total) by mouth every 6 (six) hours as needed for nausea or  vomiting. 30 tablet 0   Current Facility-Administered Medications  Medication Dose Route Frequency Provider Last Rate Last Admin   0.9 %  sodium chloride  infusion  500 mL Intravenous Once Mansouraty, Erikka Follmer Jr., MD        Current Outpatient Medications:    atorvastatin  (LIPITOR) 40 MG tablet, Take 40 mg by mouth daily., Disp: , Rfl:    busPIRone (BUSPAR) 5 MG tablet, Take 5 mg by mouth 2 (two) times daily., Disp: , Rfl:    carvedilol (COREG) 3.125 MG tablet, Take 3.125 mg by mouth 2 (two) times daily with a meal., Disp: , Rfl:    ciprofloxacin  (CIPRO ) 500 MG tablet, Take 500mg  (1 tablet) at 2pm and 10pm the day before surgery, Disp: ,  Rfl:    docusate sodium  (COLACE) 100 MG capsule, Take 100 mg by mouth daily as needed for mild constipation., Disp: , Rfl:    losartan (COZAAR) 25 MG tablet, Take 25 mg by mouth daily., Disp: , Rfl:    methadone  (DOLOPHINE ) 1 MG/1ML solution, Take 36 mg by mouth daily., Disp: , Rfl:    oxyCODONE -acetaminophen  (PERCOCET) 10-325 MG tablet, Take 1 tablet by mouth every 4 (four) hours as needed for pain., Disp: 60 tablet, Rfl: 0   SPIRIVA RESPIMAT 2.5 MCG/ACT AERS, Inhale 2 puffs into the lungs daily., Disp: , Rfl:    tamsulosin  (FLOMAX ) 0.4 MG CAPS capsule, Take 1 capsule (0.4 mg total) by mouth at bedtime. (Patient taking differently: Take 0.8 mg by mouth in the morning.), Disp: 90 capsule, Rfl: 1   traZODone  (DESYREL ) 50 MG tablet, Take 50 mg by mouth at bedtime., Disp: , Rfl:    Vitamin D , Cholecalciferol, 25 MCG (1000 UT) TABS, Take 1,000 Units by mouth daily., Disp: , Rfl:    albuterol  (VENTOLIN  HFA) 108 (90 Base) MCG/ACT inhaler, Inhale 2 puffs into the lungs every 6 (six) hours as needed for wheezing or shortness of breath., Disp: 18 g, Rfl: 0   cloNIDine  (CATAPRES ) 0.2 MG tablet, Take 0.2 mg by mouth at bedtime. (Patient not taking: Reported on 02/15/2024), Disp: , Rfl:    clopidogrel  (PLAVIX ) 75 MG tablet, Take 1 tablet (75 mg total) by mouth daily., Disp: 30  tablet, Rfl: 11   EPINEPHrine  (EPIPEN  2-PAK) 0.3 mg/0.3 mL IJ SOAJ injection, Inject 0.3 mg (1 pen) into the muscle as needed for anaphylaxis. (Patient not taking: Reported on 01/18/2024), Disp: 2 each, Rfl: 2   prochlorperazine  (COMPAZINE ) 5 MG tablet, Take 1 tablet (5 mg total) by mouth every 6 (six) hours as needed for nausea or vomiting., Disp: 30 tablet, Rfl: 0  Current Facility-Administered Medications:    0.9 %  sodium chloride  infusion, 500 mL, Intravenous, Once, Mansouraty, Aloha Raddle., MD Allergies  Allergen Reactions   Aspirin  Swelling    Lip swelling   Yellow Jacket Venom [Bee Venom] Anaphylaxis   Zolpidem  Other (See Comments)   Codeine Nausea Only   Ibuprofen  Other (See Comments)    On blood thinners   Morphine  And Codeine Itching   Family History  Problem Relation Age of Onset   Colon cancer Father    Asthma Father    Cancer Father        prostate   Hypertension Father    Asthma Mother    Hypertension Mother    Stomach cancer Neg Hx    Esophageal cancer Neg Hx    Social History   Socioeconomic History   Marital status: Widowed    Spouse name: Not on file   Number of children: 3   Years of education: Not on file   Highest education level: Not on file  Occupational History   Occupation: unemployed  Tobacco Use   Smoking status: Every Day    Current packs/day: 1.00    Average packs/day: 1 pack/day for 47.0 years (47.0 ttl pk-yrs)    Types: Cigarettes   Smokeless tobacco: Never   Tobacco comments:    Cutting back>> Quit Smart card supplied  Vaping Use   Vaping status: Never Used  Substance and Sexual Activity   Alcohol use: Yes    Alcohol/week: 1.0 standard drink of alcohol    Types: 1 Cans of beer per week    Comment: occasional   Drug use: Not Currently  Types: Marijuana, Crack cocaine    Comment: hx cocaine use- states stopped 6 months ago but last positive drug screen 09/2022   Sexual activity: Yes    Birth control/protection: Condom  Other  Topics Concern   Not on file  Social History Narrative   Not on file   Social Drivers of Health   Financial Resource Strain: Low Risk  (09/17/2023)   Received from [facility]   Overall Financial Resource Strain (CARDIA)    Difficulty of Paying Living Expenses: Not hard at all  Food Insecurity: No Food Insecurity (02/22/2024)   Received from [facility]   Hunger Vital Sign    Within the past 12 months, you worried that your food would run out before you got the money to buy more.: Never true    Within the past 12 months, the food you bought just didn't last and you didn't have money to get more.: Never true  Recent Concern: Food Insecurity - Food Insecurity Present (02/03/2024)   Hunger Vital Sign    Worried About Running Out of Food in the Last Year: Sometimes true    Ran Out of Food in the Last Year: Sometimes true  Transportation Needs: No Transportation Needs (02/22/2024)   Received from [facility]   PRAPARE - Transportation    Lack of Transportation (Medical): No    Lack of Transportation (Non-Medical): No  Physical Activity: Not on file  Stress: No Stress Concern Present (10/18/2023)   Received from [facility] Occupational Health - Occupational Stress Questionnaire    Feeling of Stress : Not at all  Recent Concern: Stress - Stress Concern Present (09/22/2023)   Received from [facility] of Occupational Health - Occupational Stress Questionnaire    Feeling of Stress : To some extent  Social Connections: Not on file  Intimate Partner Violence: Not At Risk (02/22/2024)   Received from [facility]   Humiliation, Afraid, Rape, and Kick questionnaire    Within the last year, have you been afraid of your partner or ex-partner?: No    Within the last year, have you been humiliated or emotionally abused in other ways by your partner or ex-partner?: No    Within the last year, have you been kicked, hit, slapped, or otherwise  physically hurt by your partner or ex-partner?: No    Within the last year, have you been raped or forced to have any kind of sexual activity by your partner or ex-partner?: No    Physical Exam: Today's Vitals   03/01/24 1408 03/01/24 1415  BP: 116/71   Pulse: 67   Temp: 98.4 F (36.9 C) 98.4 F (36.9 C)  SpO2: 100%   Weight: 129 lb (58.5 kg)   Height: 5' 11 (1.803 m)    Body mass index is 17.99 kg/m. GEN: NAD EYE: Sclerae anicteric ENT: MMM CV: Non-tachycardic GI: Soft, NT/ND NEURO:  Alert & Oriented x 3  Lab Results: No results for input(s): WBC, HGB, HCT, PLT in the last 72 hours. BMET No results for input(s): NA, K, CL, CO2, GLUCOSE, BUN, CREATININE, CALCIUM  in the last 72 hours. LFT No results for input(s): PROT, ALBUMIN, AST, ALT, ALKPHOS, BILITOT, BILIDIR, IBILI in the last 72 hours. PT/INR No results for input(s): LABPROT, INR in the last 72 hours.   Impression / Plan: This is a 63 y.o.male who presents for Colonoscopy for anal cancer prior to surgery.  The risks and benefits  of endoscopic evaluation/treatment were discussed with the patient and/or family; these include but are not limited to the risk of perforation, infection, bleeding, missed lesions, lack of diagnosis, severe illness requiring hospitalization, as well as anesthesia and sedation related illnesses.  The patient's history has been reviewed, patient examined, no change in status, and deemed stable for procedure.  The patient and/or family is agreeable to proceed.    Aloha Finner, MD [facility] Office # [phone]

## 2024-03-02 ENCOUNTER — Telehealth: Payer: Self-pay

## 2024-03-02 NOTE — Telephone Encounter (Signed)
  Follow up Call-     03/01/2024    2:15 PM  Call back number  Post procedure Call Back phone  # [phone]  Permission to leave phone message Yes     Patient questions:  Do you have a fever, pain , or abdominal swelling? No. Pain Score  0 *  Have you tolerated food without any problems? Yes.    Have you been able to return to your normal activities? Yes.    Do you have any questions about your discharge instructions: Diet   No. Medications  No. Follow up visit  No.  Do you have questions or concerns about your Care? No.  Actions: * If pain score is 4 or above: No action needed, pain <4.

## 2024-03-04 ENCOUNTER — Ambulatory Visit: Payer: Self-pay | Admitting: Gastroenterology

## 2024-03-04 LAB — SURGICAL PATHOLOGY

## 2024-03-07 NOTE — Progress Notes (Signed)
 ------------------------------------------------------------------------------- Attestation signed by Debarah Revel, MD at 03/07/24 1722 I saw and evaluated the patient, participating in the key portions of the service.  I reviewed the resident's note.  I agree with the resident's findings and plan.   Looks well Pain controlled Some nausea, episode of emesis earlier today, notes ++ reflux Ostomy active (stool and gas) Ambulating well  Abdo soft, NT/ND  Plan Continue regular diet Dispo planning   Revel Debarah, MD Colorectal Surgery  -------------------------------------------------------------------------------  Colorectal Surgery [id number] [facility]) Progress Note  Today's Date: 03/07/2024 Admission Date: 03/02/2024 Length of Stay: Day 5, 5 Days Post-Op Hospital Service: [facility] [place]) Attending Physician: Revel Debarah, MD   Assessment  Principal Problem:   Anal cancer    Active Problems:   Chronic pain disorder   COPD (chronic obstructive pulmonary disease)      GERD (gastroesophageal reflux disease)   Hyperlipidemia   Hypertension   Peripheral vascular disease (CMS-HCC)   Tobacco use disorder   Hypercoagulable state (HHS-HCC)   History of TIA (transient ischemic attack)   S/P AVR (aortic valve replacement)  Martin [name] is a 63 y.o. male with a complex medical history significant for anal squamous cell cancer (in 2013, treated with Nigro protocol) with recurrence in 2025, COPD, PVD with LLE stenting, CAD, AV bioprosthesis replacement (10/2023 on Plavix ), CHF, CVA vs. TIA (2018), chronic pain/polysubstance abuse on methadone  and followed by pain clinic, who presents for an abdominoperineal resection. Mr. Sohail underwent the following surgery on 03/02/2024:   Open abdominoperineal resection and end colostomy creation, lysis of adhesions with Dr. Debarah (Colorectal) Right VRAM flap for perineal closure by Dr. Cicero (Plastics).   He is recovering well postoperative, will be engaging in  ostomy care with [facility] and working with PT/OT.  Subjective  NAEON. Patient has been tolerating advanced diet. Endorses adequate oral intake. Productive colostomy. Overall drain output: 225 ml. Patient endorses some pain, unchanged.   Plan  Neuro/Psych: #Chronic pain, polysubstance abuse history, on Methadone   #Acute on chronic pain - Epidural in place, APS following and adjusting dose PRN - APS plans to remove epidural tomorrow morning   Will hold AM heparin  dose tomorrow (6/30) - IV and PO pain medications per APS - Neurovascular checks Q4H - Home methadone  34 mg daily, Buspar 5 mg BID  CV/Pulm: #AV bioprosthesis replacement, 10/2023 #PVD with LLE stenting, CAD, HLD #Prior TIA vs. CVA  #CHF  - HDS, continue home Coreg, atorvastatin  - Holding home Xarelto, Cozaar - IV Hydralazine  PRN SBP >160   #COPD - Oxygenating well on RA. IS, ambulation encouraged - Incruse Ellipta (equivalent of home Spiriva)  FEN/GI: F: PO intake E: replete lytes PRN  N: regular diet  #S/p open APR, end colostomy creation, VRAM flap on 03/02/2024 - Sitting/mobility restrictions: no sitting, keep legs as adducted as possible, log roll to stand, encourage ambulation without spreading legs, must eat laying on side  - Surgical JP drains x2 (L in pelvis/Colorectal, R in subQ/Plastics) - Penrose drains x2 (Plastics) - Strip vac to midline incision, managed by plastics. Continue 5-7 days postop - [facility] consult for new colostomy teaching - Strict I/O's   Renal/GU: - Foley remain in place - home flomax   Heme/ID: - H&H stable, no active signs/symptoms of bleeding  Prophylaxis: subcutaneous heparin  5,000 units TID, SCDs, out of bed/ambulate TID, IS, PPI Will hold heparin  for tomorrow morning 7/1 - extended DVT ppx TBD - Nursing teaching patient/family how to administer Lovenox  injections for home  Access: peripheral IV  Dispo: floor status  Objective  Vital signs over the last 24 hours: Temp:  [36.7  C (98.1 F)-37.1 C (98.8 F)] 36.8 C (98.2 F) Pulse:  [58-67] 60 SpO2 Pulse:  [64] 64 Resp:  [20] 20 BP: (109-141)/(51-68) 111/51 MAP (mmHg):  [68-85] 68 SpO2:  [96 %-100 %] 96 %  Wt Readings from Last 3 Encounters:  03/02/24 58.3 kg (128 lb 8.5 oz)  02/22/24 59.2 kg (130 lb 9.6 oz)  02/11/24 58.5 kg (129 lb)    Physical Exam: Gen: alert, well appearing, in no acute distress CV: regular rate, normotensive Pulm: breathing comfortably on room air Abd: soft, nondistended, mild tenderness. Surgical incisions approximated with Dermabond, clean and dry. Colostomy stoma pink, moist, nonprolapsed and stool in appliance. Surgical JP drain with serosanguineous fluid in bulb Ext: warm and well perfused bilaterally, no edema  Quincy Free, MD General Surgery- PGY1 [phone]

## 2024-03-07 NOTE — Consults (Signed)
 [facility] OSTOMY VISIT NOTE   Reason for Consult:  - Colostomy - Initial - Ostomy Teaching  Problem List:  Principal Problem:   Anal cancer    Active Problems:   Chronic pain disorder   COPD (chronic obstructive pulmonary disease)      GERD (gastroesophageal reflux disease)   Hyperlipidemia   Hypertension   Peripheral vascular disease (CMS-HCC)   Tobacco use disorder   Hypercoagulable state (HHS-HCC)   History of TIA (transient ischemic attack)   S/P AVR (aortic valve replacement)  Assessment: Ostomy nurse consulted today for new colostomy teaching.  Patient is alert and oriented x 4 with a history of  complex medical history significant for anal squamous cell cancer (in 2013, treated with Nigro protocol) with recurrence in 2025, COPD, PVD with LLE stenting, CAD, AV bioprosthesis replacement (10/2023 on Plavix ), CHF, CVA vs. TIA (2018), chronic pain/polysubstance abuse on methadone  and followed by pain clinic, who presents for an abdominoperineal resection. Martin [name] underwent the following surgery on 03/02/2024:   Open abdominoperineal resection and end colostomy creation, lysis of adhesions with Dr. Debarah (Colorectal) Right VRAM flap for perineal closure by Dr. Cicero (Plastics).  Patient States that he helped his wife 5 years ago when wife had a temporary ostomy.  He plans on caring for ostomy independently even though he will be discharging to a SNF.  Patient will be in a SNF temporarily while wounds heal.    Upon my assessment today, colostomy pouch that was placed on Saturday was lifting at 9 'clock.  Recommend patient discontinue the moldable system as stoma is slightly too large for moldable at this time.  Patient placed in 2 piece flat with ring.  No creasing noted and stoma well budded.    Patient observed pouch change.  Patient continues to have pain issues but able to observe pouch teaching session.  He verbalized understanding of pouching.    [facility] team is  managing patient's perineal and abdominal wounds at this time.     Stoma Type: - Colostomy Stoma Location: - LLQ (Left Lower Quadrant)   Stoma Characteristics: - Round - Budded - Diameter 1 5/8 Stoma Mucosal Condition and Color: - Moist - Edematous - Red   Mucocutaneous Junction: - Intact  Rod/Stents: - No   Output: - Brown - Thin - Stool   Peristomal Skin Condition:  - Location of skin impairment skin tear from mechanical striping from tape at 7-9 o'clock and denuded at 9 o'clock - treated with crusting  Abdominal Contours: - Flat  Pouching System: - 2 Piece - Flat - CTF (Cut to fit) Anticipated Wear Time of Pouching System: - 5 to 7 days   Teaching Limitations/Considerations:  - Patient with uncontrolled pain  Teaching/Instructions: - Discussed agreed upon goals that the patient/family member would be able to empty and change ostomy pouch, have the instructions needed to order home supplies and know how to trouble shoot pouch leakage and common peristomal skin problems. - Discussed and demonstrated pouch emptying technique and pouch burping technique to empty flatus from pouch. - Patient given instruction and provided demonstration of ostomy care including removal of old pouch, cleansing of the peristomal skin, measuring the stoma, cutting out/shaping the wafer, applying moldable barrier ring, and application of wafer/pouch to skin. - Discussed and demonstrated the technique of crusting for peristomal skin irritation. - Discussed anticipated pouch change frequency and need to change with any signs the pouch may be leaking. - Discussed activities of daily living, bathing,  and diet. - Reviewed when to contact the outpatient WOC nurse with questions/concerns. - Discussed concealment options/clothing modifications, ostomy accessory products, and support groups.  [email or web address] Orange tote with colostomy teaching booklet at the bedside  [employer] Kit -  Verbal Consent Obtained: - defer  Recommendations/Plan:  - Patient will need more ostomy teaching prior to discharge, WOC nurse will continue to follow. - WOC nurse will follow up in 1 days for pouch evaluation. - If pouch leaks, change pouching system. - Final discharge ostomy supply list.  Ostomy Discharge Goals: - SNF (Skilled Nursing Facility) recommended at discharge.   Recommended Consults:  - Case Management Plan of Care Discussed With: - Patient - RN Primary - LIP Alaina Rookard   Ostomy Supplies:  - Supplies available on unit.  Ostomy Product List:  Final Inpatient supply list: take home quantity listed   Pouch System: Hollister 2-Piece CTF Flat Wafer Red (mfg [id number]) - lawson number [phone]) order 5 Hollister 2-Piece Fecal Pouch Red (mfg [id number]) - lawson number [phone]) order 5   Accessories:  [employer] (mfg [id number]) - lawson number [phone]) order 5  Coloplast Brava Elastic Strip (mfg [phone]) - lawson number [phone]) order 10  Adapt Deodorant/Lubricant (mfg [phone]) - lawson number [phone]) order 5  Esenta ConvaTec Adhesive Remover Wipes (mfg [id number]) - lawson number [phone]) order 1 box  Hollister Ostomy Belt 23-43 (mfg 7300) - lawson number [phone]) order 1  Hollister Stoma Powder (mfg [id number]) - lawson number ([id number]) order 1  75M No-Sting Barrier Film- Pads (mfg 3342) - lawson number ([id number]) order 1 box     Outpatient Supply List  30 day supply   Pouching System  Hollister [phone]) 2 piece wafer standard wear (Red FLAT); 20 per month Hollister [phone] piece pouch with FILTER Beige (RED); 20 per month  Accessories Coloplast [phone]) barrier ring, 20 per month Coloplast (120700) elastic strip, 40 per month Hollister [id number]) Adapt odor eliminator and lubricant packet, 50 packets = 13.5 oz or 1 box = 13.5 oz Convatec ([id number]) adhesive remover wipe, 50 per month Hollister (7300) ostomy belt small, 1 per month Hollister (7906) stoma powder, 10  ounces every 6 months 75M (3342) skin barrier wipes, 150 per 6 months    WOCN Follow-up:  - WOCN follow up for teaching 7/1  Workup Time: 45 minutes  Karilyn Clement, BSN, RN, [employer] or [employer] when available

## 2024-03-21 ENCOUNTER — Inpatient Hospital Stay: Admitting: Oncology

## 2024-04-01 NOTE — Telephone Encounter (Signed)
 Called Martin [name] to schedule appointment with Dr. Debarah on same day as plastics, appointment scheduled.  Martin [name] also stated that he had not received his ostomy supplies because [facility] needed more information. [facility] was called on Martin [name] behalf and they stated that they still needed prior authorization from the insurance company and the order signed by the physician. They also stated that the process could take 24-48 business hours from today (04/01/2024 0900am) to complete and get the order faxed to Dr. Marlis office.

## 2024-04-04 ENCOUNTER — Telehealth: Payer: Self-pay | Admitting: *Deleted

## 2024-04-04 ENCOUNTER — Other Ambulatory Visit: Payer: Self-pay | Admitting: Nurse Practitioner

## 2024-04-04 DIAGNOSIS — C210 Malignant neoplasm of anus, unspecified: Secondary | ICD-10-CM

## 2024-04-04 NOTE — Telephone Encounter (Signed)
 LVM requesting refill on his pain medication,

## 2024-04-05 ENCOUNTER — Other Ambulatory Visit: Payer: Self-pay | Admitting: Oncology

## 2024-04-05 ENCOUNTER — Telehealth: Payer: Self-pay | Admitting: *Deleted

## 2024-04-05 NOTE — Telephone Encounter (Signed)
 Left VM for Mr. Keisler that his letter is ready and he needs to come by 5 pm to pick up or come in tomorrow. He is welcome to call with fax number for his attorney and we can fax it in the am. Letter at front reception desk.

## 2024-04-05 NOTE — Telephone Encounter (Signed)
 Martin [name] came to office to request letter. Informed him that MD has not had opportunity to dictate his letter yet. Still in clinic. Also informed him that he will not receive refill on the oxycodone  until he is here on 04/08/24 since another provider refilled a 14 day supply on 7/18

## 2024-04-05 NOTE — Telephone Encounter (Signed)
 Mr. Yurkovich left voice mail that he needs a letter TODAY from MD to give to his attorney today so he will be excused from a court appearance in [place]  tomorrow or he will be charged with failure to appear. He will pick up the letter when ready. Dr. Cloretta notified. Also was able to determine from [facility] that a Jingwen Mu, NP ordered oxycodone  10mg /apap 325 mg on 7/18 for #84 pills w/directions of #1 tablet every 4 hours prn pain (14 day supply). He will not be due refill until 04/08/24 and he has an appointment that day.

## 2024-04-08 ENCOUNTER — Other Ambulatory Visit: Payer: Self-pay | Admitting: Nurse Practitioner

## 2024-04-08 ENCOUNTER — Inpatient Hospital Stay: Attending: Oncology | Admitting: Oncology

## 2024-04-08 VITALS — BP 119/71 | HR 68 | Temp 98.4°F | Resp 18 | Ht 71.0 in | Wt 123.6 lb

## 2024-04-08 DIAGNOSIS — J449 Chronic obstructive pulmonary disease, unspecified: Secondary | ICD-10-CM | POA: Diagnosis not present

## 2024-04-08 DIAGNOSIS — G8918 Other acute postprocedural pain: Secondary | ICD-10-CM | POA: Diagnosis not present

## 2024-04-08 DIAGNOSIS — Y842 Radiological procedure and radiotherapy as the cause of abnormal reaction of the patient, or of later complication, without mention of misadventure at the time of the procedure: Secondary | ICD-10-CM | POA: Diagnosis not present

## 2024-04-08 DIAGNOSIS — C210 Malignant neoplasm of anus, unspecified: Secondary | ICD-10-CM | POA: Insufficient documentation

## 2024-04-08 DIAGNOSIS — I739 Peripheral vascular disease, unspecified: Secondary | ICD-10-CM | POA: Diagnosis not present

## 2024-04-08 DIAGNOSIS — Z923 Personal history of irradiation: Secondary | ICD-10-CM | POA: Insufficient documentation

## 2024-04-08 DIAGNOSIS — K56609 Unspecified intestinal obstruction, unspecified as to partial versus complete obstruction: Secondary | ICD-10-CM | POA: Insufficient documentation

## 2024-04-08 MED ORDER — OXYCODONE-ACETAMINOPHEN 10-325 MG PO TABS
1.0000 | ORAL_TABLET | Freq: Four times a day (QID) | ORAL | 0 refills | Status: DC | PRN
Start: 2024-04-08 — End: 2024-04-20

## 2024-04-08 NOTE — Progress Notes (Signed)
 [facility] OFFICE PROGRESS NOTE   Diagnosis: Anal cancer  INTERVAL HISTORY:   Martin [name] returns as scheduled.  He underwent an APR and perineal flap reconstruction at [facility] on 03/02/2024.  He was discharged 03/21/2024 to a skilled nursing facility.  He has returned home.  He continues to have pain at the abdominal and perineal surgical sites.  The perineal wound is being packed by home nurse.  He continues methadone .  He takes  Objective:  Vital signs in last 24 hours:  Blood pressure 119/71, pulse 68, temperature 98.4 F (36.9 C), temperature source Temporal, resp. rate 18, height 5' 11 (1.803 m), weight 123 lb 9.6 oz (56.1 kg), SpO2 100%.   Lymphatics: No inguinal nodes Resp: Lungs clear bilaterally Cardio: Regular rate and rhythm GI: No hepatosplenomegaly, left lower quadrant colostomy, healed right abdomen incision Vascular: No leg edema  Skin: Perineal graft with sutures in place, gauze packing at the left side of the graft, no evidence of infection   Lab Results:  Lab Results  Component Value Date   WBC 10.7 (H) 01/15/2024   HGB 11.4 (L) 01/15/2024   HCT 35.1 (L) 01/15/2024   MCV 86.9 01/15/2024   PLT 142 (L) 01/15/2024   NEUTROABS 8.9 (H) 04/28/2022    CMP  Lab Results  Component Value Date   NA 138 01/15/2024   K 3.8 01/15/2024   CL 105 01/15/2024   CO2 26 01/15/2024   GLUCOSE 95 01/15/2024   BUN 7 (L) 01/15/2024   CREATININE 0.84 01/15/2024   CALCIUM  9.2 01/15/2024   PROT 7.1 01/15/2024   ALBUMIN 3.8 01/15/2024   AST 23 01/15/2024   ALT 16 01/15/2024   ALKPHOS 125 01/15/2024   BILITOT 0.4 01/15/2024   GFRNONAA >60 01/15/2024   GFRAA >60 03/10/2020     Medications: I have reviewed the patient's current medications.   Assessment/Plan: Anal cancer Transanal excision of anal mass 10/14/2011, squamous cell carcinoma with positive surgical margins Radiation 11/19/2011 - 01/08/2012 Cycle one 5-FU/Mitomycin -C 07/2012, cycle two  5-FU/Mitomycin -C 12/15/2011 CT abdomen/pelvis 07/11/2023: Poorly defined hyperenhancing soft tissue at the anus, no pathology enlarged lymph nodes PET 12/22/2023: Diffuse nodular thickening with abnormal tracer uptake at the anal canal, right side and anteriorly, no evidence of metastatic disease, post surgical uptake in the sternum Examination under anesthesia 01/14/2024: Right anterior anal mass extending beyond the sphincter complex anteriorly and laterally, biopsy-invasive squamous of carcinoma 03/02/2024-APR/VRAM at [facility], invasive squamous cell carcinoma, moderately differentiated, 4.5 cm(rT2N0) with invasion into perianal soft tissue and distal rectum, negative proximal colon and distal anal margins, closest margin less than 0.1 cm at the distal perianal soft tissue, lymphovascular invasion identified, 0/9 nodes Aortic valve replacement 10/14/2023 COPD Narcotic addiction-followed in a pain clinic Rectal bleeding and pain secondary to #1 6.   Peripheral vascular disease 7.   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   MSSA bacteremia February 2023    Disposition: [name] underwent an APR/perineal reconstruction procedure at [facility] 03/02/2024.  The pathology confirmed recurrent anal cancer with invasion into perianal soft tissue and the distal rectum.  There was lymphovascular invasion.  Lymph nodes were negative.  The surgical margins were negative, but close.  He is recovering from surgery.  The plan is to follow him with observation.  He will return for an office visit in 2 months.  He continues methadone .  He continues to have postoperative pain.  He will wean  Percocet as tolerated.  We refilled the prescription for Percocet today.  Martin Hof, MD  04/08/2024  10:21 AM

## 2024-04-14 ENCOUNTER — Telehealth: Payer: Self-pay | Admitting: *Deleted

## 2024-04-14 NOTE — Telephone Encounter (Signed)
 Martin [name] left VM that he has not had BM in 2 days. Took colace #2 yesterday and #2 today. Reports being uncomfortable at ostomy site. Feels pressure in rectal area when he voids. Called him back and he denies any fever, N/V or abdominal distention or firmness. He states that he reached out to [facility], Dr. Debarah. Spoke with his nurse who told him to start MiraLax and she will talk to Dr. Debarah and call him back. Confirmed with him that the surgeon is the best one to manage this issue. Told him to take the MiraLax bid and continue colace. Go to ER for severe pain, N/V or fever. Will make Dr. Cloretta aware of conversation.

## 2024-04-17 ENCOUNTER — Ambulatory Visit
Admission: RE | Admit: 2024-04-17 | Discharge: 2024-04-17 | Disposition: A | Discharge status: Home / Self Care | Source: Ambulatory Visit | Attending: Registered Nurse | Admitting: Registered Nurse

## 2024-04-17 ENCOUNTER — Inpatient Hospital Stay
Admission: RE | Admit: 2024-04-17 | Discharge: 2024-04-17 | Discharge status: Home / Self Care | Payer: Self-pay | Source: Ambulatory Visit

## 2024-04-18 ENCOUNTER — Ambulatory Visit

## 2024-04-19 NOTE — Telephone Encounter (Signed)
 Called to let Martin [name] know about his studies in radiology to be completed Friday 04/22/2024 at 1:30 and to arrive at 1:00. He was agreeable.

## 2024-04-20 ENCOUNTER — Other Ambulatory Visit: Payer: Self-pay | Admitting: Nurse Practitioner

## 2024-04-20 ENCOUNTER — Telehealth: Payer: Self-pay

## 2024-04-20 DIAGNOSIS — C210 Malignant neoplasm of anus, unspecified: Secondary | ICD-10-CM

## 2024-04-20 MED ORDER — OXYCODONE-ACETAMINOPHEN 10-325 MG PO TABS: 1.0000 | ORAL_TABLET | Freq: Three times a day (TID) | ORAL | 0 refills | Status: DC | PRN

## 2024-04-20 NOTE — Telephone Encounter (Signed)
 Patient called and request a refill of his Percocet, placed the request on the np's desk.

## 2024-05-02 ENCOUNTER — Telehealth: Payer: Self-pay | Admitting: *Deleted

## 2024-05-02 NOTE — Telephone Encounter (Signed)
 [name] called to request refill on his oxycodone /apap. Refill will be due 05/03/24 (14 day supply). [name] made aware that it will be refilled tomorrow and he is being weaned off as well. He understands.

## 2024-05-03 ENCOUNTER — Other Ambulatory Visit: Payer: Self-pay | Admitting: Nurse Practitioner

## 2024-05-03 DIAGNOSIS — C210 Malignant neoplasm of anus, unspecified: Secondary | ICD-10-CM

## 2024-05-03 MED ORDER — OXYCODONE-ACETAMINOPHEN 10-325 MG PO TABS: 1.0000 | ORAL_TABLET | Freq: Two times a day (BID) | ORAL | 0 refills | Status: DC | PRN

## 2024-05-12 NOTE — Progress Notes (Signed)
 Plastic Surgery Clinic   Surgical History: 03/02/24- APR due to recurrent anal SCC with L colostomy and R VRAM for perineal closure.  HPI: Martin [name] is a 63 y.o. male who presents for follow up after the above procedures. He has been sitting and driving on his own. He is doing well. His ostomy is working. His abdominal incision has healed. He is living at home.   Exam:  Visit Vitals BP 130/66 (BP Position: Sitting)  Pulse 53  Resp 16    GEN: pleasant, well developed male, in no distress.   PULM: nonlabored CV: normocardic ABD: soft, nondistended. Midline abdominal incision healed. No hernia or bulge. Perineum: Color is uniform over skin paddle, with a near normal color. Skin paddle completely viable. All incisions fully healed with some suture tails remaining.  No drainage, no dehiscence. No erythema on native skin.   A/P: Martin [name] presents for followup after the above surgery  - OK to sit normally - Continue with high protein diet - Suture tails trimmed today - Discussed Vaseline to perineum and massage to flap and incisions BID to help flap soften further.  - Follow up: PRN   Comer Miamiville, MD, FACS Assistant Professor  Division of Plastic and Reconstructive Surgery [facility]

## 2024-05-16 ENCOUNTER — Telehealth: Payer: Self-pay | Admitting: *Deleted

## 2024-05-16 ENCOUNTER — Other Ambulatory Visit: Payer: Self-pay | Admitting: Nurse Practitioner

## 2024-05-16 DIAGNOSIS — C210 Malignant neoplasm of anus, unspecified: Secondary | ICD-10-CM

## 2024-05-16 MED ORDER — OXYCODONE-ACETAMINOPHEN 10-325 MG PO TABS
1.0000 | ORAL_TABLET | Freq: Every day | ORAL | 0 refills | Status: DC | PRN
Start: 2024-05-16 — End: 2024-05-30

## 2024-05-16 NOTE — Telephone Encounter (Signed)
 Mr. Kaeser called to request refill on his oxycodone  10/325.

## 2024-05-23 ENCOUNTER — Telehealth: Payer: Self-pay

## 2024-05-23 ENCOUNTER — Other Ambulatory Visit: Payer: Self-pay | Admitting: Nurse Practitioner

## 2024-05-23 DIAGNOSIS — C210 Malignant neoplasm of anus, unspecified: Secondary | ICD-10-CM

## 2024-05-23 NOTE — Telephone Encounter (Signed)
 Patient called and request a refill of his Percocet, placed the request on the np's desk.

## 2024-05-27 ENCOUNTER — Other Ambulatory Visit: Payer: Self-pay | Admitting: Nurse Practitioner

## 2024-05-27 DIAGNOSIS — C210 Malignant neoplasm of anus, unspecified: Secondary | ICD-10-CM

## 2024-05-30 ENCOUNTER — Other Ambulatory Visit: Payer: Self-pay | Admitting: Nurse Practitioner

## 2024-05-30 DIAGNOSIS — C210 Malignant neoplasm of anus, unspecified: Secondary | ICD-10-CM

## 2024-05-30 MED ORDER — OXYCODONE-ACETAMINOPHEN 10-325 MG PO TABS: 0.5000 | ORAL_TABLET | Freq: Every day | ORAL | 0 refills | Status: DC | PRN

## 2024-06-13 ENCOUNTER — Other Ambulatory Visit: Payer: Self-pay | Admitting: Nurse Practitioner

## 2024-06-13 ENCOUNTER — Inpatient Hospital Stay: Attending: Oncology | Admitting: Oncology

## 2024-06-13 DIAGNOSIS — F1120 Opioid dependence, uncomplicated: Secondary | ICD-10-CM | POA: Insufficient documentation

## 2024-06-13 DIAGNOSIS — Z933 Colostomy status: Secondary | ICD-10-CM | POA: Diagnosis not present

## 2024-06-13 DIAGNOSIS — Z923 Personal history of irradiation: Secondary | ICD-10-CM | POA: Insufficient documentation

## 2024-06-13 DIAGNOSIS — K520 Gastroenteritis and colitis due to radiation: Secondary | ICD-10-CM | POA: Diagnosis not present

## 2024-06-13 DIAGNOSIS — I739 Peripheral vascular disease, unspecified: Secondary | ICD-10-CM | POA: Diagnosis not present

## 2024-06-13 DIAGNOSIS — K56609 Unspecified intestinal obstruction, unspecified as to partial versus complete obstruction: Secondary | ICD-10-CM | POA: Insufficient documentation

## 2024-06-13 DIAGNOSIS — J449 Chronic obstructive pulmonary disease, unspecified: Secondary | ICD-10-CM | POA: Diagnosis not present

## 2024-06-13 DIAGNOSIS — C210 Malignant neoplasm of anus, unspecified: Secondary | ICD-10-CM

## 2024-06-13 MED ORDER — OXYCODONE-ACETAMINOPHEN 10-325 MG PO TABS: 0.5000 | ORAL_TABLET | Freq: Every day | ORAL | 0 refills | Status: AC | PRN

## 2024-06-13 MED ORDER — PROCHLORPERAZINE MALEATE 5 MG PO TABS: 5.0000 mg | ORAL_TABLET | Freq: Four times a day (QID) | ORAL | 2 refills | Status: AC | PRN

## 2024-06-13 NOTE — Progress Notes (Signed)
 Provided Mr. Mayor #3 bottles of strawberry Ensure and coupons.

## 2024-06-13 NOTE — Progress Notes (Signed)
 [facility] OFFICE PROGRESS NOTE   Diagnosis: Anal cancer  INTERVAL HISTORY:   Martin [name] turns as scheduled.  He continues to have lower abdominal pain and perineal pain.  Methadone  and gabapentin  do not help.  He is scheduled for follow-up the pain clinic next week.  He is having bowel movements.  The colostomy is functioning well.  He is working part-time.  Objective:  Vital signs in last 24 hours:  Blood pressure (!) 169/81, pulse (!) 56, temperature 98 F (36.7 C), temperature source Temporal, resp. rate 18, height 5' 11 (1.803 m), weight 123 lb (55.8 kg), SpO2 100%.    Lymphatics: No cervical, supraclavicular, axillary, or inguinal nodes Resp: Lungs clear bilaterally Cardio: Regular rate and rhythm GI: No hepatosplenomegaly, left lower quadrant colostomy with brown stool, no mass Vascular: No leg edema  Skin: Perineal surgical site is healed and without evidence of recurrent tumor   Lab Results:  Lab Results  Component Value Date   WBC 10.7 (H) 01/15/2024   HGB 11.4 (L) 01/15/2024   HCT 35.1 (L) 01/15/2024   MCV 86.9 01/15/2024   PLT 142 (L) 01/15/2024   NEUTROABS 8.9 (H) 04/28/2022    CMP  Lab Results  Component Value Date   NA 138 01/15/2024   K 3.8 01/15/2024   CL 105 01/15/2024   CO2 26 01/15/2024   GLUCOSE 95 01/15/2024   BUN 7 (L) 01/15/2024   CREATININE 0.84 01/15/2024   CALCIUM  9.2 01/15/2024   PROT 7.1 01/15/2024   ALBUMIN 3.8 01/15/2024   AST 23 01/15/2024   ALT 16 01/15/2024   ALKPHOS 125 01/15/2024   BILITOT 0.4 01/15/2024   GFRNONAA >60 01/15/2024   GFRAA >60 03/10/2020    Medications: I have reviewed the patient's current medications.   Assessment/Plan: Anal cancer Transanal excision of anal mass 10/14/2011, squamous cell carcinoma with positive surgical margins Radiation 11/19/2011 - 01/08/2012 Cycle one 5-FU/Mitomycin -C 07/2012, cycle two 5-FU/Mitomycin -C 12/15/2011 CT abdomen/pelvis 07/11/2023: Poorly defined  hyperenhancing soft tissue at the anus, no pathology enlarged lymph nodes PET 12/22/2023: Diffuse nodular thickening with abnormal tracer uptake at the anal canal, right side and anteriorly, no evidence of metastatic disease, post surgical uptake in the sternum Examination under anesthesia 01/14/2024: Right anterior anal mass extending beyond the sphincter complex anteriorly and laterally, biopsy-invasive squamous of carcinoma 03/02/2024-APR/VRAM at [facility], invasive squamous cell carcinoma, moderately differentiated, 4.5 cm(rT2N0) with invasion into perianal soft tissue and distal rectum, negative proximal colon and distal anal margins, closest margin less than 0.1 cm at the distal perianal soft tissue, lymphovascular invasion identified, 0/9 nodes Aortic valve replacement 10/14/2023 COPD Narcotic addiction-followed in a pain clinic Rectal bleeding and pain secondary to #1 6.   Peripheral vascular disease 7.   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   MSSA bacteremia February 2023     Disposition: Martin [name] has a history of locally recurrent anal cancer.  He underwent an APR in June.  He is in clinical remission.  He has a chronic pain syndrome in addition to pain related to the APR.  He continues methadone .  He is scheduled for follow-up at the pain clinic next week.  I gave him 5 oxycodone  tablets to last until he is seen at the pain clinic.  He will receive all narcotics via the pain clinic in the future.  Martin [name] will undergo a restaging CT evaluation prior to an office visit in 2  months.  He does not wish to receive an influenza vaccine.  Arley Hof, MD  06/13/2024  10:31 AM

## 2024-06-25 ENCOUNTER — Inpatient Hospital Stay: Admission: RE | Admit: 2024-06-25 | Payer: Self-pay | Source: Ambulatory Visit

## 2024-07-05 NOTE — Progress Notes (Signed)
 Office Note  07/05/2024  Visit Problem List:  1. Chronic systolic congestive heart failure (*)  Ambulatory referral to Urology    2. S/P AVR (aortic valve replacement)  Ambulatory referral to Urology    3. Coronary artery disease involving native coronary artery of native heart without angina pectoris  Ambulatory referral to Urology    4. Hyperlipidemia, unspecified hyperlipidemia type  Ambulatory referral to Urology    5. Primary hypertension  Ambulatory referral to Urology    6. Tobacco use  Ambulatory referral to Urology    7. PAD (peripheral artery disease)  Ambulatory referral to Urology    8. Chronic obstructive pulmonary disease, unspecified COPD type (*)  Ambulatory referral to Urology    9. Abnormal EKG  ECG 12 lead   Ambulatory referral to Urology    10. Urinary frequency  Ambulatory referral to Urology        Chronic Problem List:  Patient Active Problem List Diagnosis  . Alcohol abuse  . Cocaine abuse (*)  . Bee sting  . Leukocytosis  . Hypertension  . Enteritis  . PRES (posterior reversible encephalopathy syndrome)  . Grief reaction  . Moderate protein-calorie malnutrition (*)  . Constipation  . Anal cancer (*)  . Radiation enteritis  . Small bowel obstruction (*)  . GAD (generalized anxiety disorder)  . Aortic valve insufficiency  . Aortic valve insufficiency, etiology of cardiac valve disease unspecified  . Nonrheumatic aortic valve insufficiency  . COPD (chronic obstructive pulmonary disease) (*)  . Chronic pain syndrome  . BPH (benign prostatic hyperplasia)  . Atherosclerosis of native arteries of extremity with intermittent claudication  . Anxiety  . Hyperlipidemia  . Severe aortic insufficiency  . Acute hyperglycemia  . Tobacco use  . Encounter for tobacco use cessation counseling  . Marijuana use     Medications:   Current Outpatient Medications:  .  albuterol   sulfate HFA (VENTOLIN  HFA) 108 (90 Base) MCG/ACT inhaler, Inhale two puffs into the lungs every 6 (six) hours as needed for Wheezing., Disp: , Rfl:  .  atorvastatin  (LIPITOR) 40 mg tablet, Take one tablet (40 mg dose) by mouth daily., Disp: , Rfl:  .  carvedilol (COREG) 3.125 mg tablet, Take one tablet (3.125 mg dose) by mouth 2 (two) times daily., Disp: 60 tablet, Rfl: 5 .  cephALEXin  (KEFLEX ) 500 mg capsule, Take one capsule (500 mg dose) by mouth 3 (three) times a day., Disp: , Rfl:  .  cholecalciferol 1,000 units (25 mcg) tablet, Take one tablet (1,000 Units dose) by mouth daily., Disp: , Rfl:  .  clopidogrel  bisulfate (PLAVIX ) 75 mg tablet, Take one tablet (75 mg dose) by mouth daily., Disp: 30 tablet, Rfl: 11 .  docusate sodium  (COLACE,DOK,DOCQLACE) capsule, Take one capsule (100 mg dose) by mouth daily., Disp: , Rfl:  .  EPINEPHrine  (EPIPEN  2-PAK) 0.3 mg/0.3 mL injection, Inject 0.3 mLs (0.3 mg dose) into the muscle once as needed for Anaphylaxis., Disp: , Rfl:  .  Fluticasone  Furoate-Vilanterol (BREO ELLIPTA ) 50-25 MCG/ACT AEPB, Inhale into the lungs., Disp: , Rfl:  .  gabapentin  (NEURONTIN ) 100 mg capsule, TAKE 1 CAPSULE BY MOUTH THREE TIMES DAILY FOR PAIN/NEUROPATHY, Disp: , Rfl:  .  ipratropium-albuterol  (DUONEB) 0.5-2.5 mg/3 mL ML SOLN nebulizer solution, USE 1 AMPULE IN NEBULIZER EVERY 4 HOURS AS NEEDED FOR SHORTNESS OF BREATH FOR WHEEZING FOR COUGH, Disp: , Rfl:  .  METHADONE  HCL PO, Take 41 mg by mouth daily., Disp: , Rfl:  .  methocarbamol  (ROBAXIN )  500 mg tablet, Take one tablet (500 mg dose) by mouth every 8 (eight) hours as needed., Disp: , Rfl:  .  naloxone (NARCAN) nasal spray (TAKE HOME PACK), one spray by Intranasal route once as needed for Opioid Reversal for up to 30 days., Disp: 1 each, Rfl: 0 .  oxyCODONE -acetaminophen  (PERCOCET,ENDOCET) 10-325 mg per tablet, Take one tablet by mouth every 4 (four) hours as needed., Disp: , Rfl:  .  pantoprazole  sodium (PROTONIX ) 40 mg  tablet, Take one tablet (40 mg dose) by mouth daily., Disp: 30 tablet, Rfl: 6 .  polyethylene glycol (MIRALAX,GAVILAX,CLEARLAX) powder, Take seventeen g by mouth as needed for Constipation., Disp: , Rfl:  .  prochlorperazine  (COMPAZINE ) 5 MG tablet, TAKE 1 TABLET BY MOUTH EVERY 6 HOURS AS NEEDED FOR NAUSEA FOR VOMITING, Disp: , Rfl:  .  SPIRIVA RESPIMAT 2.5 MCG/ACT inhaler, Inhale two puffs into the lungs daily. Patient states takes as needed, Disp: , Rfl:  .  spironolactone (ALDACTONE) 25 mg tablet, Take one tablet (25 mg dose) by mouth daily. (Patient not taking: Reported on 07/05/2024), Disp: , Rfl:  .  tamsulosin  (FLOMAX ) 0.4 mg CAPS, Take one capsule (0.4 mg dose) by mouth daily., Disp: , Rfl:  .  traZODone  (DESYREL ) 50 mg tablet, Take one tablet (50 mg dose) by mouth at bedtime., Disp: , Rfl:   Interval History: Martin [name] returns to clinic today for work in appointment with complaints of a recently abnormal EKG at his pain management clinic. He is status post aortic valve replacement in 10/2023.  He has been found to have recurrent anal carcinoma and has been undergoing treatment for that. His left ventricular function normalized following his aortic valve replacement and his dyspnea has been much better. 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 He is also fighting a respiratory infection and is on antibiotics. He has been having a lot of stomach pain since starting the antibiotics. The EKG taken at pain management is not available for review. EKG today showing his regular baseline sinus rhythm. No significant abnormalities seen, especially when compared with older tracings. He had a fairly significant cardiac workup prior to the valve replacement and from cardiac standpoint is doing well. Suspect his current issues are more related to his other concerns. Discussed being sure to eat with antibiotic to avoid stomach upset. He will follow up as  scheduled with Dr Lilian or sooner if needed   ROS General - weight stable, appetite normal; no fever HEENT- vision stable, hearing normal; no difficulty swallowing Endocrine - thyroid stable Resp - denies cough, hemoptysis, purulent sputum Abdomen - denies belly pain, swelling; denies liver disease or jaundice GI - no hematemesis, melena. Normal bowel movements. No Crohns/UC history GU - no dysuria, hematuria, pyuria. No UTI or kidney stone. Neuro - no slurred speech, blurred vision; denies amaurosis or sided weakness Skin - no rash, bruising, petechia Hematologic - no recent bleeding or easy bruising Allergy - denies food allergy; drug allergies as noted Psych - No depression, normal sleep   Physical Examination Vitals:   07/05/24 0842  BP: 146/66  BP Location: Left Upper Arm  Patient Position: Sitting  Pulse: 75  SpO2: 99%  Weight: 124 lb 6.4 oz (56.4 kg)  Height: 5' 11 (1.803 m)    General - Pt. in NAD, normal demeanor, speech normal, oriented normally HEENT - PERRLA, EOMs intact, nares and throat clear Neck - carotid upstrokes normal not present,  bruits present. Thyroid nonpalpable. No palpable nodes. Lungs - auscultation reveals clear breath sounds Heart -regular rhythm, normal S1, S2, no gallop, no murmur is present Abdomen - soft, bowel sounds positive, no organomegaly Extrem -no edema is noted, no cyanosis, clubbing is seen Skin - no rash, bruising or petechia Back - no CVA tenderness Neuro - strength is symmetric in upper and lower extremities; reflexes normal, speech normal, face symmetric Psych - affect appropriate   Post-visit Impression:  Abnormal EKG Aortic valve insufficiency Status post tissue valve aortic valve replacement Left ventricular dysfunction, improved status post valve replacement Anal carcinoma with recurrence Urinary frequency   Recommendations:  Reassurance provided with appropriate EKG in office today Recommend he reach out to  the office that is prescribing the antibiotics or his Oncology team about his current symptoms Continue current medical therapies Patient was advised to visit ED or contact this clinic should any significant or worsening cardiac symptoms occur before next visit.  Follow up as scheduled in February or sooner as needed   Josette Silvan, PA-C [facility]

## 2024-07-06 ENCOUNTER — Other Ambulatory Visit: Payer: Self-pay

## 2024-07-06 ENCOUNTER — Encounter (HOSPITAL_BASED_OUTPATIENT_CLINIC_OR_DEPARTMENT_OTHER): Payer: Self-pay

## 2024-07-06 ENCOUNTER — Emergency Department (HOSPITAL_BASED_OUTPATIENT_CLINIC_OR_DEPARTMENT_OTHER): Admitting: Radiology

## 2024-07-06 ENCOUNTER — Emergency Department (HOSPITAL_BASED_OUTPATIENT_CLINIC_OR_DEPARTMENT_OTHER)
Admission: EM | Admit: 2024-07-06 | Discharge: 2024-07-06 | Disposition: A | Discharge status: Home / Self Care | Source: Ambulatory Visit | Attending: Emergency Medicine | Admitting: Emergency Medicine

## 2024-07-06 ENCOUNTER — Emergency Department (HOSPITAL_BASED_OUTPATIENT_CLINIC_OR_DEPARTMENT_OTHER)

## 2024-07-06 DIAGNOSIS — I509 Heart failure, unspecified: Secondary | ICD-10-CM | POA: Diagnosis not present

## 2024-07-06 DIAGNOSIS — I110 Hypertensive heart disease with heart failure: Secondary | ICD-10-CM | POA: Insufficient documentation

## 2024-07-06 DIAGNOSIS — J449 Chronic obstructive pulmonary disease, unspecified: Secondary | ICD-10-CM | POA: Insufficient documentation

## 2024-07-06 DIAGNOSIS — R7989 Other specified abnormal findings of blood chemistry: Secondary | ICD-10-CM | POA: Insufficient documentation

## 2024-07-06 DIAGNOSIS — J181 Lobar pneumonia, unspecified organism: Secondary | ICD-10-CM | POA: Insufficient documentation

## 2024-07-06 DIAGNOSIS — J189 Pneumonia, unspecified organism: Secondary | ICD-10-CM

## 2024-07-06 DIAGNOSIS — Z79899 Other long term (current) drug therapy: Secondary | ICD-10-CM | POA: Insufficient documentation

## 2024-07-06 DIAGNOSIS — R059 Cough, unspecified: Secondary | ICD-10-CM | POA: Diagnosis present

## 2024-07-06 DIAGNOSIS — Z7902 Long term (current) use of antithrombotics/antiplatelets: Secondary | ICD-10-CM | POA: Diagnosis not present

## 2024-07-06 DIAGNOSIS — D72829 Elevated white blood cell count, unspecified: Secondary | ICD-10-CM | POA: Diagnosis not present

## 2024-07-06 DIAGNOSIS — Z8673 Personal history of transient ischemic attack (TIA), and cerebral infarction without residual deficits: Secondary | ICD-10-CM | POA: Diagnosis not present

## 2024-07-06 DIAGNOSIS — Z85048 Personal history of other malignant neoplasm of rectum, rectosigmoid junction, and anus: Secondary | ICD-10-CM | POA: Diagnosis not present

## 2024-07-06 LAB — CBC WITH DIFFERENTIAL/PLATELET
Abs Immature Granulocytes: 0.14 K/uL — ABNORMAL HIGH (ref 0.00–0.07)
Basophils Absolute: 0.1 K/uL (ref 0.0–0.1)
Basophils Relative: 0 %
Eosinophils Absolute: 0.1 K/uL (ref 0.0–0.5)
Eosinophils Relative: 0 %
HCT: 34.6 % — ABNORMAL LOW (ref 39.0–52.0)
Hemoglobin: 11.7 g/dL — ABNORMAL LOW (ref 13.0–17.0)
Immature Granulocytes: 1 %
Lymphocytes Relative: 7 %
Lymphs Abs: 1.5 K/uL (ref 0.7–4.0)
MCH: 30.9 pg (ref 26.0–34.0)
MCHC: 33.8 g/dL (ref 30.0–36.0)
MCV: 91.3 fL (ref 80.0–100.0)
Monocytes Absolute: 1.5 K/uL — ABNORMAL HIGH (ref 0.1–1.0)
Monocytes Relative: 8 %
Neutro Abs: 16.7 K/uL — ABNORMAL HIGH (ref 1.7–7.7)
Neutrophils Relative %: 84 %
Platelets: 129 K/uL — ABNORMAL LOW (ref 150–400)
RBC: 3.79 MIL/uL — ABNORMAL LOW (ref 4.22–5.81)
RDW: 14 % (ref 11.5–15.5)
WBC: 19.9 K/uL — ABNORMAL HIGH (ref 4.0–10.5)
nRBC: 0 % (ref 0.0–0.2)

## 2024-07-06 LAB — COMPREHENSIVE METABOLIC PANEL WITH GFR
ALT: 12 U/L (ref 0–44)
AST: 18 U/L (ref 15–41)
Albumin: 4 g/dL (ref 3.5–5.0)
Alkaline Phosphatase: 127 U/L — ABNORMAL HIGH (ref 38–126)
Anion gap: 9 (ref 5–15)
BUN: 9 mg/dL (ref 8–23)
CO2: 26 mmol/L (ref 22–32)
Calcium: 9.4 mg/dL (ref 8.9–10.3)
Chloride: 99 mmol/L (ref 98–111)
Creatinine, Ser: 0.74 mg/dL (ref 0.61–1.24)
GFR, Estimated: >60 mL/min (ref >60)
Glucose, Bld: 112 mg/dL — ABNORMAL HIGH (ref 70–99)
Potassium: 4.3 mmol/L (ref 3.5–5.1)
Sodium: 134 mmol/L — ABNORMAL LOW (ref 135–145)
Total Bilirubin: 0.7 mg/dL (ref 0.0–1.2)
Total Protein: 7.2 g/dL (ref 6.5–8.1)

## 2024-07-06 LAB — RESP PANEL BY RT-PCR (RSV, FLU A&B, COVID)  RVPGX2
Influenza A by PCR: NEGATIVE
Influenza B by PCR: NEGATIVE
Resp Syncytial Virus by PCR: NEGATIVE
SARS Coronavirus 2 by RT PCR: NEGATIVE

## 2024-07-06 LAB — TROPONIN T, HIGH SENSITIVITY
Troponin T High Sensitivity: 17 ng/L (ref 0–19)
Troponin T High Sensitivity: 15 ng/L (ref 0–19)

## 2024-07-06 LAB — D-DIMER, QUANTITATIVE: D-Dimer, Quant: 0.87 ug{FEU}/mL — ABNORMAL HIGH (ref 0.00–0.50)

## 2024-07-06 LAB — PRO BRAIN NATRIURETIC PEPTIDE: Pro Brain Natriuretic Peptide: 879 pg/mL — ABNORMAL HIGH (ref <300.0)

## 2024-07-06 MED ORDER — IOHEXOL 350 MG/ML SOLN
100.0000 mL | Freq: Once | INTRAVENOUS | Status: AC | PRN
  Administered 2024-07-06: 80 mL via INTRAVENOUS

## 2024-07-06 MED ORDER — IPRATROPIUM-ALBUTEROL 0.5-2.5 (3) MG/3ML IN SOLN: 3.0000 mL | RESPIRATORY_TRACT | 2 refills | Status: AC | PRN

## 2024-07-06 MED ORDER — IPRATROPIUM-ALBUTEROL 0.5-2.5 (3) MG/3ML IN SOLN
3.0000 mL | Freq: Once | RESPIRATORY_TRACT | Status: AC
  Administered 2024-07-06: 3 mL via RESPIRATORY_TRACT
  Filled 2024-07-06: qty 3

## 2024-07-06 MED ORDER — SODIUM CHLORIDE 0.9 % IV BOLUS
1000.0000 mL | Freq: Once | INTRAVENOUS | Status: AC
  Administered 2024-07-06: 1000 mL via INTRAVENOUS

## 2024-07-06 MED ORDER — AMOXICILLIN-POT CLAVULANATE 875-125 MG PO TABS: 1.0000 | ORAL_TABLET | Freq: Two times a day (BID) | ORAL | 0 refills | Status: DC

## 2024-07-06 MED ORDER — SODIUM CHLORIDE 0.9 % IV SOLN
1.0000 g | Freq: Once | INTRAVENOUS | Status: AC
  Administered 2024-07-06: 1 g via INTRAVENOUS
  Filled 2024-07-06: qty 10

## 2024-07-06 MED ORDER — PREDNISONE 10 MG (21) PO TBPK: ORAL_TABLET | Freq: Every day | ORAL | 0 refills | Status: DC

## 2024-07-06 MED ORDER — SODIUM CHLORIDE 0.9 % IV SOLN
500.0000 mg | Freq: Once | INTRAVENOUS | Status: AC
  Administered 2024-07-06: 500 mg via INTRAVENOUS
  Filled 2024-07-06: qty 5

## 2024-07-06 MED ORDER — METHYLPREDNISOLONE SODIUM SUCC 125 MG IJ SOLR
125.0000 mg | Freq: Once | INTRAMUSCULAR | Status: AC
  Administered 2024-07-06: 125 mg via INTRAVENOUS
  Filled 2024-07-06: qty 2

## 2024-07-06 MED ORDER — AZITHROMYCIN 250 MG PO TABS: 250.0000 mg | ORAL_TABLET | Freq: Every day | ORAL | 0 refills | Status: DC

## 2024-07-06 NOTE — Progress Notes (Signed)
 Patient ambulated without oxygen. He walked a brisk pace, no SHOB or increased respiratory rate. His lowest SPO2 was 97% HR 77 and RR 18. His highest SPO2 was 99% HR 68 RR 18. during recovery from the walk.

## 2024-07-06 NOTE — ED Triage Notes (Signed)
 Pt to Ed from home with c/I flulike symptoms for the past week. Pt reports a green mucus when he coughs. Pt is a cancer pt and reports several other ongoing medical pts. Arrives A+O, VSS, NADN.

## 2024-07-06 NOTE — ED Provider Notes (Addendum)
 [place] EMERGENCY DEPARTMENT AT [facility] Provider Note   CSN: [id number] Arrival date & time: 07/06/24  9050     Patient presents with: Cough   Martin [name] is a 63 y.o. male with a history of anal carcinoma status post chemo, radiation, surgical resection with colostomy, aortic valve replacement 2/25, COPD, CHF presents with complaints of myalgias, productive cough, sinus congestion over the past week.  Reports that he had a fever of 103 yesterday.  Denies any abdominal pain but does report that yesterday he had an episode of emesis without diarrhea.  He does endorse some chest tightness and shortness of breath as well.  He is unsure whether this is at his baseline given history of cardiac and respiratory issues.  He is on Plavix , no longer on Eliquis .   HPI    Past Medical History:  Diagnosis Date  . Allergy 10/2011  . 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 Anxiety   . Arthritis    DDD lumbar, arthritis knees  . Complication of anesthesia    pt states he woke up during a vascular procedure  . COPD (chronic obstructive pulmonary disease) (HCC)   . DJD (degenerative joint disease) of lumbar spine   . GERD (gastroesophageal reflux disease)   . Hemorrhoid    internal  . History of bowel resection 04/19/2017  . Hyperlipidemia   . Hypertension    Pt states he's never been told or treated for HTN  . Illicit drug use    + UDS cocaine, THC, opiates 10/11/21  . Inguinal hernia   . MSSA bacteremia 10/11/2021   s/p IV cefazolin , po Linezolid ; 10/16/21 TEE w/o vegetations.  . Peripheral vascular disease   . Pneumonia    as child, cough at present with no fever  . Radiation 11/19/11-01/08/12   5040 cGy 28 fx Pelvis and inguinal area  . Stroke [facility])    TIA,  no weakness or paralysis  . Substance abuse [facility])    last use was Jan. 2024; patient states he has been clean since then   Past Surgical History:  Procedure Laterality  Date  . APPENDECTOMY     age 27 or 5  . CARPAL TUNNEL RELEASE Right 02/04/2023   Procedure: RIGHT CARPAL TUNNEL RELEASE;  Surgeon: Jerri Kay HERO, MD;  Location: [facility];  Service: Orthopedics;  Laterality: Right;  . EXAMINATION UNDER ANESTHESIA  10/14/2011   Procedure: EXAM UNDER ANESTHESIA;  Surgeon: Krystal HERO Spinner, MD;  Location: [facility];  Service: General;  Laterality: N/A;  exam under anethesia, Excision of mass anal canal, 1.5cm  . INGUINAL HERNIA REPAIR  05/11/2012   Procedure: HERNIA REPAIR INGUINAL ADULT;  Surgeon: Krystal HERO Spinner, MD;  Location: [facility];  Service: General;  Laterality: Left;  left inguinal hernia repair with mesh  . LOWER EXTREMITY ANGIOGRAPHY Left 07/02/2021   Procedure: LOWER EXTREMITY ANGIOGRAPHY;  Surgeon: Jama Cordella MATSU, MD;  Location: [facility] INVASIVE CV LAB;  Service: Cardiovascular;  Laterality: Left;  . LOWER EXTREMITY ANGIOGRAPHY Right 07/16/2021   Procedure: LOWER EXTREMITY ANGIOGRAPHY;  Surgeon: Jama Cordella MATSU, MD;  Location: [facility];  Service: Cardiovascular;  Laterality: Right;  . LOWER EXTREMITY ANGIOGRAPHY Left 08/27/2021   Procedure: LOWER EXTREMITY ANGIOGRAPHY;  Surgeon: Jama Cordella MATSU, MD;  Location: [facility] IN[facility];  Service: Cardiovascular;  Laterality: Left;  . LOWER EXTREMITY ANGIOGRAPHY Left 01/14/2022   Procedure: Lower  Extremity Angiography;  Surgeon: Jama Cordella MATSU, MD;  Location: [facility] INVASIVE CV LAB;  Service: Cardiovascular;  Laterality: Left;  . LOWER EXTREMITY ANGIOGRAPHY Left 11/04/2022   Procedure: Lower Extremity Angiography;  Surgeon: Jama Cordella MATSU, MD;  Location: [facility] INVASIVE CV LAB;  Service: Cardiovascular;  Laterality: Left;  . RECTAL BIOPSY N/A 01/14/2024   Procedure: CORE NEEDLE BIOPSY ANAL MASS;  Surgeon: Debby Hila, MD;  Location: [facility];  Service: General;  Laterality: N/A;  . RECTAL EXAM UNDER ANESTHESIA N/A 01/14/2024   Procedure: EXAM UNDER ANESTHESIA,  RECTUM;  Surgeon: Debby Hila, MD;  Location: [facility];  Service: General;  Laterality: N/A;  . SMALL INTESTINE SURGERY  04/19/2017   exploratory lap, partial small bowel resection for perforated ulcer/abcess, radiation enteritis  . surgical pathology   10/14/2011   squamous cell ca of anus  . TEE WITHOUT CARDIOVERSION N/A 10/16/2021   Procedure: TRANSESOPHAGEAL ECHOCARDIOGRAM (TEE);  Surgeon: Mona Vinie BROCKS, MD;  Location: [facility] ENDOS[facility];  Service: Cardiovascular;  Laterality: N/A;  . TOOTH EXTRACTION N/A 12/04/2021   Procedure: DENTAL RESTORATION/EXTRACTIONS;  Surgeon: Sheryle Hamilton, DMD;  Location: [facility];  Service: Oral Surgery;  Laterality: N/A;  . transanal excision  10/14/2011   Dr.Todd Gerkin     Prior to Admission medications   Medication Sig Start Date End Date Taking? Authorizing Provider  amoxicillin -clavulanate (AUGMENTIN ) 875-125 MG tablet Take 1 tablet by mouth every 12 (twelve) hours. 07/06/24  Yes Donnajean Lynwood DEL, PA-C  azithromycin  (ZITHROMAX ) 250 MG tablet Take 1 tablet (250 mg total) by mouth daily. Take first 2 tablets together, then 1 every day until finished. 07/06/24  Yes Donnajean Lynwood DEL, PA-C  erythromycin ophthalmic ointment SMARTSIG:In Eye(s) 06/28/24  Yes [provider]  ipratropium-albuterol  (DUONEB) 0.5-2.5 (3) MG/3ML SOLN Take 3 mLs by nebulization every 4 (four) hours as needed. 07/06/24  Yes Yolande Lamar BROCKS, MD  predniSONE  (STERAPRED UNI-PAK 21 TAB) 10 MG (21) TBPK tablet Take by mouth daily. Take 6 tabs by mouth daily  for 2 days, then 5 tabs for 2 days, then 4 tabs for 2 days, then 3 tabs for 2 days, 2 tabs for 2 days, then 1 tab by mouth daily for 2 days 07/06/24  Yes Donnajean Lynwood DEL, PA-C  albuterol  (VENTOLIN  HFA) 108 (90 Base) MCG/ACT inhaler Inhale 2 puffs into the lungs every 6 (six) hours as needed for wheezing or shortness of breath. 05/11/23   Chandra Harlene LABOR, NP  atorvastatin  (LIPITOR) 40 MG tablet Take 40 mg by mouth daily.     [provider]  carvedilol (COREG) 3.125 MG tablet Take 3.125 mg by mouth 2 (two) times daily with a meal. 12/23/23   [provider]  cloNIDine  (CATAPRES ) 0.2 MG tablet Take 0.2 mg by mouth at bedtime.    [provider]  clopidogrel  (PLAVIX ) 75 MG tablet Take 1 tablet (75 mg total) by mouth daily. 10/07/22   Brown, Fallon E, NP  EPINEPHrine  (EPIPEN  2-PAK) 0.3 mg/0.3 mL IJ SOAJ injection Inject 0.3 mg (1 pen) into the muscle as needed for anaphylaxis. Patient not taking: Reported on 06/13/2024 12/30/22   Lorren Greig PARAS, NP  gabapentin  (NEURONTIN ) 100 MG capsule Take 100 mg by mouth 3 (three) times daily. 06/06/24   [provider]  levocetirizine (XYZAL) 5 MG tablet Take 5 mg by mouth every evening.    [provider]  methadone  (DOLOPHINE ) 1 MG/1ML solution Take 36 mg by mouth daily.    [provider]  methocarbamol  (ROBAXIN )  500 MG tablet Take 500 mg by mouth every 8 (eight) hours as needed. 06/06/24   [provider]  oxyCODONE -acetaminophen  (PERCOCET) 10-325 MG tablet Take 0.5 tablets by mouth daily as needed for pain. 06/13/24   Cloretta Arley NOVAK, MD  Polyethylene Glycol 3350  (MIRALAX PO) Take 17 g by mouth daily.    [provider]  prochlorperazine  (COMPAZINE ) 5 MG tablet Take 1 tablet (5 mg total) by mouth every 6 (six) hours as needed for nausea or vomiting. 06/13/24   Cloretta Arley NOVAK, MD  SPIRIVA RESPIMAT 2.5 MCG/ACT AERS Inhale 2 puffs into the lungs daily. 06/02/23   [provider]  tamsulosin  (FLOMAX ) 0.4 MG CAPS capsule Take 1 capsule (0.4 mg total) by mouth at bedtime. 09/16/22   Tanda Bleacher, MD  traZODone  (DESYREL ) 50 MG tablet Take 50 mg by mouth at bedtime. 08/04/23   [provider]  Vitamin D , Cholecalciferol, 25 MCG (1000 UT) TABS Take 1,000 Units by mouth daily.    [provider]    Allergies: Aspirin , Yellow jacket venom [bee venom], Zolpidem , Codeine, Ibuprofen , and Morphine  and  codeine    Review of Systems  Respiratory:  Positive for shortness of breath.     Updated Vital Signs BP 115/62   Pulse 77   Temp 97.7 F (36.5 C) (Axillary)   Resp (!) 24   SpO2 100%   Physical Exam Vitals and nursing note reviewed.  Constitutional:      General: He is not in acute distress.    Appearance: He is well-developed.  HENT:     Head: Normocephalic and atraumatic.  Eyes:     Conjunctiva/sclera: Conjunctivae normal.  Cardiovascular:     Rate and Rhythm: Normal rate and regular rhythm.     Heart sounds: No murmur heard. Pulmonary:     Effort: Pulmonary effort is normal. No respiratory distress.     Comments: Diffuse rhonchi Abdominal:     Palpations: Abdomen is soft.     Tenderness: There is no abdominal tenderness.     Comments: Colostomy bag with good output  Musculoskeletal:        General: No swelling.     Cervical back: Neck supple.  Skin:    General: Skin is warm and dry.     Capillary Refill: Capillary refill takes less than 2 seconds.  Neurological:     Mental Status: He is alert.  Psychiatric:        Mood and Affect: Mood normal.     (all labs ordered are listed, but only abnormal results are displayed) Labs Reviewed  CBC WITH DIFFERENTIAL/PLATELET - Abnormal; Notable for the following components:      Result Value   WBC 19.9 (*)    RBC 3.79 (*)    Hemoglobin 11.7 (*)    HCT 34.6 (*)    Platelets 129 (*)    Neutro Abs 16.7 (*)    Monocytes Absolute 1.5 (*)    Abs Immature Granulocytes 0.14 (*)    All other components within normal limits  PRO BRAIN NATRIURETIC PEPTIDE - Abnormal; Notable for the following components:   Pro Brain Natriuretic Peptide 879.0 (*)    All other components within normal limits  COMPREHENSIVE METABOLIC PANEL WITH GFR - Abnormal; Notable for the following components:   Sodium 134 (*)    Glucose, Bld 112 (*)    Alkaline Phosphatase 127 (*)    All other components within normal limits  D-DIMER, QUANTITATIVE  (NOT AT [facility]) - Abnormal;  Notable for the following components:   D-Dimer, Quant 0.87 (*)    All other components within normal limits  RESP PANEL BY RT-PCR (RSV, FLU A&B, COVID)  [id number]  CULTURE, BLOOD (ROUTINE X 2)  CULTURE, BLOOD (ROUTINE X 2)  TROPONIN T, HIGH SENSITIVITY  TROPONIN T, HIGH SENSITIVITY    EKG: EKG Interpretation Date/Time:  Wednesday July 06 2024 12:16:20 EDT Ventricular Rate:  64 PR Interval:  180 QRS Duration:  92 QT Interval:  413 QTC Calculation: 427 R Axis:   86  Text Interpretation: Sinus rhythm Consider left atrial enlargement Borderline right axis deviation Nonspecific ST abnormality Confirmed by Yolande Charleston [phone]) on 07/06/2024 2:39:45 PM  Radiology: CT Angio Chest PE W and/or Wo Contrast Result Date: 07/06/2024 CLINICAL DATA:  Pulmonary embolism (PE) suspected, low to intermediate prob, neg D-dimer. EXAM: CT ANGIOGRAPHY CHEST WITH CONTRAST TECHNIQUE: Multidetector CT imaging of the chest was performed using the standard protocol during bolus administration of intravenous contrast. Multiplanar CT image reconstructions and MIPs were obtained to evaluate the vascular anatomy. RADIATION DOSE REDUCTION: This exam was performed according to the departmental dose-optimization program which includes automated exposure control, adjustment of the mA and/or kV according to patient size and/or use of iterative reconstruction technique. CONTRAST:  80mL OMNIPAQUE  IOHEXOL  350 MG/ML SOLN COMPARISON:  Nuclear medicine PET scan from 12/22/2023 and CT angiography chest from 04/19/2022. FINDINGS: Cardiovascular: No evidence of embolism to the proximal subsegmental pulmonary artery level. Normal cardiac size. No pericardial effusion. No aortic aneurysm. There are coronary artery calcifications, in keeping with coronary artery disease. There are also mild peripheral atherosclerotic vascular calcifications of thoracic aorta and its major branches. There is prosthetic aortic  valve. Mediastinum/Nodes: Visualized thyroid gland appears grossly unremarkable. No solid / cystic mediastinal masses. The esophagus is nondistended precluding optimal assessment. There are few mildly prominent mediastinal and hilar lymph nodes, which do not meet the size criteria for lymphadenopathy and appear grossly similar to the prior study, favoring benign etiology. No axillary lymphadenopathy by size criteria. Lungs/Pleura: The central tracheo-bronchial tree is patent. However, there are multiple occlusive and nonocclusive filling defects mainly in the bilateral lower lobe distal segmental and subsegmental bronchial tree, likely due to mucus/secretions or aspiration. There is mild, smooth, circumferential thickening of the segmental and subsegmental bronchial walls, throughout bilateral lungs, which is nonspecific. Findings are most commonly seen with bronchitis or reactive airway disease, such as asthma. There are mild emphysematous changes throughout bilateral lungs. There are multiple patchy airspace opacities predominantly in the bilateral upper lobes (marked with electronic arrow sign on series 6) with largest such area in the right upper lobe, posterior segment measuring up to 3.6 x 5.7 cm, favored to represent multilobar pneumonia. Follow-up to clearing is recommended. There are additional small areas of tree-in-bud configuration nodules, for example in the left lung lower lobe (series 6, image 115) and middle lobe (series 6, image 110), nonspecific but likely sequela of infection or inflammation. Attention on follow-up examination is recommended. No lung collapse, pleural effusion or pneumothorax. Upper Abdomen: Visualized upper abdominal viscera within normal limits. Musculoskeletal: Sternotomy wires noted. The visualized soft tissues of the chest wall are grossly unremarkable. No suspicious osseous lesions. There are mild multilevel degenerative changes in the visualized spine. Review of the MIP  images confirms the above findings. IMPRESSION: 1. No embolism to the proximal subsegmental pulmonary artery level. 2. Multiple patchy airspace opacities in the bilateral upper lobes, favored to represent multilobar pneumonia. Follow-up to clearing is recommended. 3. Multiple other  nonacute observations, as described above. Aortic Atherosclerosis (ICD10-I70.0) and Emphysema (ICD10-J43.9). Electronically Signed   By: Ree Molt M.D.   On: 07/06/2024 13:49   DG Chest 2 View Result Date: 07/06/2024 EXAM: 2 VIEW(S) XRAY OF THE CHEST 07/06/2024 11:06:00 AM COMPARISON: 12/05/2023 CLINICAL HISTORY: Cough. Pt reports a green mucus when he coughs. FINDINGS: LUNGS AND PLEURA: New central right upper lobe airspace opacity, suspicious for pneumonia. Atelectasis versus scarring in the left lower lobe. HEART AND MEDIASTINUM: Within normal limits. Prior median sternotomy and aortic valve replacement. BONES AND SOFT TISSUES: No acute osseous abnormality. IMPRESSION: 1. New central right upper lobe airspace opacity, suspicious for pneumonia. Recommend continued chest radiographic follow-up to confirm resolution. 2. Atelectasis versus scarring in the left lower lobe. Electronically signed by: Norleen Kil MD 07/06/2024 12:15 PM EDT RP Workstation: [id number]     Procedures   Medications Ordered in the ED  ipratropium-albuterol  (DUONEB) 0.5-2.5 (3) MG/3ML nebulizer solution 3 mL (3 mLs Nebulization Given 07/06/24 1123)  ipratropium-albuterol  (DUONEB) 0.5-2.5 (3) MG/3ML nebulizer solution 3 mL (3 mLs Nebulization Given 07/06/24 1240)  methylPREDNISolone  sodium succinate (SOLU-MEDROL ) 125 mg/2 mL injection 125 mg (125 mg Intravenous Given 07/06/24 1149)  cefTRIAXone  (ROCEPHIN ) 1 g in sodium chloride  0.9 % 100 mL IVPB (0 g Intravenous Stopped 07/06/24 1428)  azithromycin  (ZITHROMAX ) 500 mg in sodium chloride  0.9 % 250 mL IVPB (0 mg Intravenous Stopped 07/06/24 1512)  sodium chloride  0.9 % bolus 1,000 mL (0 mLs  Intravenous Stopped 07/06/24 1558)  iohexol  (OMNIPAQUE ) 350 MG/ML injection 100 mL (80 mLs Intravenous Contrast Given 07/06/24 1300)  ipratropium-albuterol  (DUONEB) 0.5-2.5 (3) MG/3ML nebulizer solution 3 mL (3 mLs Nebulization Given 07/06/24 1516)    Clinical Course as of 07/06/24 1618  Wed Jul 06, 2024  1102 Patient with history of anal cancer, CHF, COPD presents with complaints of flulike symptoms x 1 week with fever and emesis x 1 yesterday.  Upon arrival patient is hemodynamically stable.  He has diffuse rhonchi with left lower lobe Rales.  His abdomen is nontender.  His colostomy bag appears to have good output.  He has no lower extremity edema.  Will obtain infectious workup and provide breathing treatments.  Discussed patient with Dr. Zavitz.  He is in agreement with plan of care. [JT]  1130 CBC with Differential(!) Significant leukocytosis of 19.9 [JT]  1130 Resp panel by RT-PCR (RSV, Flu A&B, Covid) Anterior Nasal Swab Negative [JT]  1215 Brain natriuretic peptide(!) Elevated to 879 [JT]  1215 Troponin T, High Sensitivity Without elevation [JT]  1215 Comprehensive metabolic panel(!) Alk phos elevated at 127, otherwise no other significant abnormality [JT]  1240 DG Chest 2 View New central airspace opacity.  Will give Rocephin  and azithromycin  [JT]  1356 CT Angio Chest PE W and/or Wo Contrast No evidence of PE.  Consistent with multifocal pneumonia. [JT]  1357 Patient would prefer to be discharged home.  He will receive antibiotics and additional breathing treatments here and will ambulate him. [JT]  1531 Patient reports marked improvement of his symptoms since breathing treatments.  He remains hemodynamically stable and had a successful ambulation test.  Patient will be discharged home.  Will send in course of Augmentin , azithromycin  as well as steroids.  Strict return precautions provided.  He will follow-up with PCP.  Patient is understanding and in agreement with plan.  Discussed  discharge plan with Dr. Jakie, he is in agreement with plan. [JT]    Clinical Course User Index [JT] Donnajean Lynwood DEL, PA-C  Medical Decision Making Amount and/or Complexity of Data Reviewed Labs: ordered. Decision-making details documented in ED Course. Radiology: ordered. Decision-making details documented in ED Course.  Risk Prescription drug management.   This patient presents to the ED with chief complaint(s) of cough .  The complaint involves an extensive differential diagnosis and also carries with it a high risk of complications and morbidity.   Pertinent past medical history as listed in HPI  The differential diagnosis includes  Do not suspect ACS as patient is without any EKG changes and troponins are without elevation.  PE study is negative.  Do not suspect aortic dissection.  Additional history obtained: Records reviewed Care Everywhere/External Records  Disposition:   Patient will be discharged home. The patient has been appropriately medically screened and/or stabilized in the ED. I have low suspicion for any other emergent medical condition which would require further screening, evaluation or treatment in the ED or require inpatient management. At time of discharge the patient is hemodynamically stable and in no acute distress. I have discussed work-up results and diagnosis with patient and answered all questions. Patient is agreeable with discharge plan. We discussed strict return precautions for returning to the emergency department and they verbalized understanding.     Social Determinants of Health:   none  This note was dictated with voice recognition software.  Despite best efforts at proofreading, errors may have occurred which can change the documentation meaning.       Final diagnoses:  Community acquired pneumonia of right middle lobe of lung    ED Discharge Orders          Ordered    ipratropium-albuterol   (DUONEB) 0.5-2.5 (3) MG/3ML SOLN  Every 4 hours PRN        07/06/24 1541    azithromycin  (ZITHROMAX ) 250 MG tablet  Daily        07/06/24 1542    amoxicillin -clavulanate (AUGMENTIN ) 875-125 MG tablet  Every 12 hours        07/06/24 1542    predniSONE  (STERAPRED UNI-PAK 21 TAB) 10 MG (21) TBPK tablet  Daily        07/06/24 1542               Donnajean Lynwood DEL, PA-C 07/06/24 1553    Donnajean Lynwood DEL, PA-C 07/06/24 1618    Yolande Lamar BROCKS, MD 07/09/24 [phone]

## 2024-07-06 NOTE — ED Notes (Signed)

## 2024-07-06 NOTE — Discharge Instructions (Signed)
 You were evaluated in the emergency room for cough.  Your workup was consistent with a pneumonia.  A prescription for Augmentin  and azithromycin  was sent to your pharmacy.  Please be sure to complete the full course of antibiotics.  If you experience any new or worsening symptoms please return emergency room.  Otherwise please follow-up with your primary care doctor within the next 3 to 5 days.

## 2024-07-11 LAB — CULTURE, BLOOD (ROUTINE X 2)
Culture: NO GROWTH
Culture: NO GROWTH
Special Requests: ADEQUATE
Special Requests: ADEQUATE

## 2024-08-03 ENCOUNTER — Telehealth: Payer: Self-pay | Admitting: *Deleted

## 2024-08-03 NOTE — Telephone Encounter (Signed)
 Called Mr. Beegle and left VM with his CT appointment date/time and new lab time. Also mailed to his home.

## 2024-08-10 ENCOUNTER — Emergency Department (HOSPITAL_BASED_OUTPATIENT_CLINIC_OR_DEPARTMENT_OTHER): Admitting: Radiology

## 2024-08-10 ENCOUNTER — Emergency Department (HOSPITAL_BASED_OUTPATIENT_CLINIC_OR_DEPARTMENT_OTHER)
Admission: EM | Admit: 2024-08-10 | Discharge: 2024-08-10 | Disposition: A | Discharge status: Home / Self Care | Attending: Emergency Medicine | Admitting: Emergency Medicine

## 2024-08-10 ENCOUNTER — Emergency Department (HOSPITAL_BASED_OUTPATIENT_CLINIC_OR_DEPARTMENT_OTHER)

## 2024-08-10 ENCOUNTER — Other Ambulatory Visit: Payer: Self-pay

## 2024-08-10 DIAGNOSIS — Z7901 Long term (current) use of anticoagulants: Secondary | ICD-10-CM | POA: Diagnosis not present

## 2024-08-10 DIAGNOSIS — J168 Pneumonia due to other specified infectious organisms: Secondary | ICD-10-CM | POA: Diagnosis not present

## 2024-08-10 DIAGNOSIS — J0100 Acute maxillary sinusitis, unspecified: Secondary | ICD-10-CM | POA: Insufficient documentation

## 2024-08-10 DIAGNOSIS — R519 Headache, unspecified: Secondary | ICD-10-CM | POA: Insufficient documentation

## 2024-08-10 DIAGNOSIS — I509 Heart failure, unspecified: Secondary | ICD-10-CM | POA: Insufficient documentation

## 2024-08-10 DIAGNOSIS — J181 Lobar pneumonia, unspecified organism: Secondary | ICD-10-CM | POA: Diagnosis not present

## 2024-08-10 DIAGNOSIS — J449 Chronic obstructive pulmonary disease, unspecified: Secondary | ICD-10-CM | POA: Diagnosis not present

## 2024-08-10 DIAGNOSIS — R059 Cough, unspecified: Secondary | ICD-10-CM | POA: Diagnosis present

## 2024-08-10 DIAGNOSIS — J189 Pneumonia, unspecified organism: Secondary | ICD-10-CM

## 2024-08-10 LAB — RESP PANEL BY RT-PCR (RSV, FLU A&B, COVID)  RVPGX2
Influenza A by PCR: NEGATIVE
Influenza B by PCR: NEGATIVE
Resp Syncytial Virus by PCR: NEGATIVE
SARS Coronavirus 2 by RT PCR: NEGATIVE

## 2024-08-10 MED ORDER — LEVOFLOXACIN 750 MG PO TABS: 750.0000 mg | ORAL_TABLET | Freq: Every day | ORAL | 0 refills | Status: DC

## 2024-08-10 NOTE — ED Triage Notes (Signed)
 Reports cough, congestion, and body aches x 3 days.

## 2024-08-10 NOTE — ED Notes (Signed)
 Patient transported to CT

## 2024-08-10 NOTE — Discharge Instructions (Addendum)
 Please read and follow all provided instructions.  Your diagnoses today include:  1. Pneumonia of left lower lobe due to infectious organism   2. Acute maxillary sinusitis, recurrence not specified     Tests performed today include: Flu, COVID, RSV testing: Was negative Chest x-ray -- shows pneumonia CT head --does not show any signs of bleeding in the brain or other serious problems, you do have a maxillary sinus infection Vital signs. See below for your results today.   Medications prescribed:  Levofloxacin : medication for sinus infection and pneumonia  Take any prescribed medications only as directed.  Home care instructions:  Follow any educational materials contained in this packet.  Take the complete course of antibiotics that you were prescribed.   Continue Tylenol  for headache.  BE VERY CAREFUL not to take multiple medicines containing Tylenol  (also called acetaminophen ). Doing so can lead to an overdose which can damage your liver and cause liver failure and possibly death.   Follow-up instructions: Please follow-up with your primary care provider in the next 3 days for further evaluation of your symptoms and to ensure resolution of your infection.   Return instructions:  Please return to the Emergency Department if you experience worsening symptoms.  Return immediately with worsening breathing, worsening shortness of breath, or if you feel it is taking you more effort to breathe.  Return if you have severe headache with vomiting, weakness in your arms or legs, slurred speech, trouble walking or talking, confusion, or trouble with your balance.  Please return if you have any other emergent concerns.  Additional Information:  Your vital signs today were: BP 135/71 (BP Location: Right Arm)   Pulse 82   Temp 98.7 F (37.1 C) (Oral)   Resp 18   SpO2 100%  If your blood pressure (BP) was elevated above 135/85 this visit, please have this repeated by your doctor within  one month. --------------

## 2024-08-10 NOTE — ED Provider Notes (Signed)
 [place] EMERGENCY DEPARTMENT AT [facility] Provider Note   CSN: [id number] Arrival date & time: 08/10/24  1331     Patient presents with: Cough   Martin [name] is a 63 y.o. male.   Patient with h/o rectal cancer status post resection and colostomy, COPD, and CHF, PNA on CT 06/2024 which was treated with improvement, recent rx cefdinir by PCP recently completed --presents for evaluation of worsening cough, productive of clear sputum, body aches, nasal congestion.  The symptoms have been present for about 3 days, but patient also reports increasing frontal headache that started yesterday.  Patient does not usually get headaches and this has been a bit distressing for him.  No vomiting or confusion.  No neck pain.  No numbness, weakness, or tingling in the arms of the legs.  Patient is on Plavix .  Denies head injuries.       Prior to Admission medications   Medication Sig Start Date End Date Taking? Authorizing Provider  albuterol  (VENTOLIN  HFA) 108 (90 Base) MCG/ACT inhaler Inhale 2 puffs into the lungs every 6 (six) hours as needed for wheezing or shortness of breath. 05/11/23   Chandra Harlene LABOR, NP  amoxicillin -clavulanate (AUGMENTIN ) 875-125 MG tablet Take 1 tablet by mouth every 12 (twelve) hours. 07/06/24   Donnajean Lynwood DEL, PA-C  atorvastatin  (LIPITOR) 40 MG tablet Take 40 mg by mouth daily.    [provider]  azithromycin  (ZITHROMAX ) 250 MG tablet Take 1 tablet (250 mg total) by mouth daily. Take first 2 tablets together, then 1 every day until finished. 07/06/24   Donnajean Lynwood DEL, PA-C  carvedilol (COREG) 3.125 MG tablet Take 3.125 mg by mouth 2 (two) times daily with a meal. 12/23/23   [provider]  cloNIDine  (CATAPRES ) 0.2 MG tablet Take 0.2 mg by mouth at bedtime.    [provider]  clopidogrel  (PLAVIX ) 75 MG tablet Take 1 tablet (75 mg total) by mouth daily. 10/07/22   Brown, Fallon E, NP  EPINEPHrine  (EPIPEN  2-PAK) 0.3 mg/0.3  mL IJ SOAJ injection Inject 0.3 mg (1 pen) into the muscle as needed for anaphylaxis. Patient not taking: Reported on 06/13/2024 12/30/22   Jaycee Greig JINNY, NP  erythromycin ophthalmic ointment SMARTSIG:In Eye(s) 06/28/24   [provider]  gabapentin  (NEURONTIN ) 100 MG capsule Take 100 mg by mouth 3 (three) times daily. 06/06/24   [provider]  ipratropium-albuterol  (DUONEB) 0.5-2.5 (3) MG/3ML SOLN Take 3 mLs by nebulization every 4 (four) hours as needed. 07/06/24   Yolande Lamar BROCKS, MD  levocetirizine (XYZAL) 5 MG tablet Take 5 mg by mouth every evening.    [provider]  methadone  (DOLOPHINE ) 1 MG/1ML solution Take 36 mg by mouth daily.    [provider]  methocarbamol  (ROBAXIN ) 500 MG tablet Take 500 mg by mouth every 8 (eight) hours as needed. 06/06/24   [provider]  oxyCODONE -acetaminophen  (PERCOCET) 10-325 MG tablet Take 0.5 tablets by mouth daily as needed for pain. 06/13/24   Cloretta Arley NOVAK, MD  Polyethylene Glycol 3350  (MIRALAX PO) Take 17 g by mouth daily.    [provider]  predniSONE  (STERAPRED UNI-PAK 21 TAB) 10 MG (21) TBPK tablet Take by mouth daily. Take 6 tabs by mouth daily  for 2 days, then 5 tabs for 2 days, then 4 tabs for 2 days, then 3 tabs for 2 days, 2 tabs for 2 days, then 1 tab by mouth daily for 2 days 07/06/24   Donnajean Lynwood DEL,  PA-C  prochlorperazine  (COMPAZINE ) 5 MG tablet Take 1 tablet (5 mg total) by mouth every 6 (six) hours as needed for nausea or vomiting. 06/13/24   Cloretta Arley NOVAK, MD  SPIRIVA RESPIMAT 2.5 MCG/ACT AERS Inhale 2 puffs into the lungs daily. 06/02/23   [provider]  tamsulosin  (FLOMAX ) 0.4 MG CAPS capsule Take 1 capsule (0.4 mg total) by mouth at bedtime. 09/16/22   Tanda Bleacher, MD  traZODone  (DESYREL ) 50 MG tablet Take 50 mg by mouth at bedtime. 08/04/23   [provider]  Vitamin D , Cholecalciferol, 25 MCG (1000 UT) TABS Take 1,000 Units by mouth daily.     [provider]    Allergies: Aspirin , Yellow jacket venom [bee venom], Zolpidem , Codeine, Ibuprofen , and Morphine  and codeine    Review of Systems  Updated Vital Signs BP 135/71 (BP Location: Right Arm)   Pulse 82   Resp 18   SpO2 100%   Physical Exam Vitals and nursing note reviewed.  Constitutional:      General: He is not in acute distress.    Appearance: He is well-developed.  HENT:     Head: Normocephalic and atraumatic.     Right Ear: Tympanic membrane, ear canal and external ear normal.     Left Ear: Tympanic membrane, ear canal and external ear normal.     Nose:     Right Sinus: Frontal sinus tenderness present. No maxillary sinus tenderness.     Left Sinus: Frontal sinus tenderness present. No maxillary sinus tenderness.     Mouth/Throat:     Mouth: Mucous membranes are moist.     Pharynx: Uvula midline.     Tonsils: No tonsillar exudate.  Eyes:     General: Lids are normal.        Right eye: No discharge.        Left eye: No discharge.     Conjunctiva/sclera: Conjunctivae normal.     Pupils: Pupils are equal, round, and reactive to light.  Cardiovascular:     Rate and Rhythm: Normal rate and regular rhythm.     Heart sounds: Normal heart sounds.  Pulmonary:     Effort: Pulmonary effort is normal.     Breath sounds: Wheezing and rhonchi present.     Comments: Wheezes and rhonchi throughout, worse rhonchi in left base Abdominal:     Palpations: Abdomen is soft.     Tenderness: There is no abdominal tenderness. There is no guarding or rebound.  Musculoskeletal:        General: Normal range of motion.     Cervical back: Normal range of motion and neck supple. No tenderness or bony tenderness.  Skin:    General: Skin is warm and dry.  Neurological:     Mental Status: He is alert and oriented to person, place, and time.     GCS: GCS eye subscore is 4. GCS verbal subscore is 5. GCS motor subscore is 6.     Cranial Nerves: No cranial nerve deficit.      Sensory: No sensory deficit.     Motor: No abnormal muscle tone.     Coordination: Coordination normal.     Gait: Gait normal.     (all labs ordered are listed, but only abnormal results are displayed) Labs Reviewed  RESP PANEL BY RT-PCR (RSV, FLU A&B, COVID)  [id number]    EKG: None  Radiology: DG Chest 2 View Result Date: 08/10/2024 CLINICAL DATA:  Cough, congestion body aches.  Smoker. EXAM:  CHEST - 2 VIEW COMPARISON:  07/06/2024 and CT chest 07/06/2024. FINDINGS: Trachea is midline. Heart size normal. Lungs are hyperinflated. Previously seen right upper lobe airspace opacification has resolved in the interval. There may be new nodular airspace opacification in the left lung base, along the left heart border. Streaky scarring in the lung bases. IMPRESSION: 1. Resolved right upper lobe pneumonia. 2. Possible new bronchopneumonia in the left lung base. Electronically Signed   By: Newell Eke M.D.   On: 08/10/2024 14:44     Procedures   Medications Ordered in the ED - No data to display  ED Course  Patient seen and examined. History obtained directly from patient.  Reviewed viral panel and x-ray ordered in triage.  Labs/EKG: Flu, COVID, RSV testing was negative.  Imaging: Chest x-ray ordered in triage appears to have left basilar pneumonia.  Medications/Fluids: Offered breathing treatment, patient declines.  Most recent vital signs reviewed and are as follows: BP 135/71 (BP Location: Right Arm)   Pulse 82   Resp 18   SpO2 100%   Initial impression: Patient with complicated past medical history with COPD and CHF.  Recent pneumonia, treated and improved.  He has had 3 days of worsening symptoms and new left lower lobe pneumonia on chest x-ray.  Patient recently completed cefdinir.  He was on azithromycin  and Augmentin  for pneumonia 5 weeks ago.  Will start levofloxacin .  In addition, patient with frontal headache.  He does have some temporal sinus tenderness, but does appear  uncomfortable with this headache.  He does not usually get headaches.  This may be due to cough, possible sinusitis, etc. but patient is on antiplatelet medication.  Given this we will order screening CT.  Neuroexam is reassuring.  No lateralizing symptoms or signs of meningismus.  4:49 PM Reassessment performed. Patient appears stable, no neurologic decompensation.  Imaging personally visualized and interpreted including: CT head, agree no acute findings other than maxillary sinusitis.  Reviewed pertinent lab work and imaging with patient at bedside. Questions answered.   Most current vital signs reviewed and are as follows: BP 135/71 (BP Location: Right Arm)   Pulse 82   Temp 98.7 F (37.1 C) (Oral)   Resp 18   SpO2 100%   Plan: Discharge to home.   Prescriptions written for: Levofloxacin   Other home care instructions discussed: Tylenol  for headache  ED return instructions discussed: Worsening chest pain, shortness of breath or trouble breathing.  Patient counseled to return if they have weakness in their arms or legs, slurred speech, trouble walking or talking, confusion, trouble with their balance, or if they have any other concerns. Patient verbalizes understanding and agrees with plan.   Follow-up instructions discussed: Patient encouraged to follow-up with their PCP in 3 days.                                   Medical Decision Making Amount and/or Complexity of Data Reviewed Radiology: ordered.  Risk Prescription drug management.   Patient with worsening cough with underlying pulmonary disease.  Previous pneumonia looks improved however chest x-ray suggestive of a new left lower lobe infiltrate.  Negative flu, COVID, RSV testing.  Normal oxygen saturation without hypoxia, vitals reassuring.  Patient has been on several antibiotics recently.  Respiratory fluoroquinolone ordered at this time.  Encouraged outpatient follow-up.  Low concern for ACS, PE, pneumothorax  etc.  In regards to the patient's headache, critical differentials  were considered including subarachnoid hemorrhage, intracerebral hemorrhage, epidural/subdural hematoma, pituitary apoplexy, vertebral/carotid artery dissection, giant cell arteritis, central venous thrombosis, reversible cerebral vasoconstriction, acute angle closure glaucoma, idiopathic intracranial hypertension, bacterial meningitis, viral encephalitis, carbon monoxide poisoning, posterior reversible encephalopathy syndrome, pre-eclampsia.   Reg flag symptoms related to these causes were considered including systemic symptoms (fever, weight loss), neurologic symptoms (confusion, mental status change, vision change, associated seizure), acute or sudden thunderclap onset, patient age 32 or older with new or progressive headache, patient of any age with first headache or change in headache pattern, pregnant or postpartum status, history of HIV or other immunocompromise, history of cancer, headache occurring with exertion, associated neck or shoulder pain, associated traumatic injury, concurrent use of anticoagulation, family history of spontaneous SAH, and concurrent drug use.    Other benign, more common causes of headache were considered including migraine, tension-type headache, cluster headache, referred pain from other cause such as sinus infection, dental pain, trigeminal neuralgia.   On exam, patient has a reassuring neuro exam including baseline mental status, no significant neck pain or meningeal signs, no signs of severe infection or fever.   Furthermore, head CT without signs of bleeding.  Suggests sinusitis as potential other etiology of new frontal headache.  No indication for further workup at this time.  The patient's vital signs, pertinent lab work and imaging were reviewed and interpreted as discussed in the ED course. Hospitalization was considered for further testing, treatments, or serial exams/observation. However  as patient is well-appearing, has a stable exam over the course of their evaluation, and reassuring studies today, I do not feel that they warrant admission at this time. This plan was discussed with the patient who verbalizes agreement and comfort with this plan and seems reliable and able to return to the Emergency Department with worsening or changing symptoms.       Final diagnoses:  Pneumonia of left lower lobe due to infectious organism  Acute maxillary sinusitis, recurrence not specified  Acute nonintractable headache, unspecified headache type    ED Discharge Orders          Ordered    levofloxacin  (LEVAQUIN ) 750 MG tablet  Daily        08/10/24 1646               Desiderio Chew, PA-C 08/10/24 1651    Towana Ozell BROCKS, MD 08/11/24 1018

## 2024-08-22 ENCOUNTER — Inpatient Hospital Stay: Attending: Oncology

## 2024-08-22 ENCOUNTER — Ambulatory Visit (HOSPITAL_BASED_OUTPATIENT_CLINIC_OR_DEPARTMENT_OTHER): Admission: RE | Admit: 2024-08-22 | Discharge: 2024-08-22 | Attending: Oncology | Admitting: Oncology

## 2024-08-22 DIAGNOSIS — C210 Malignant neoplasm of anus, unspecified: Secondary | ICD-10-CM

## 2024-08-22 DIAGNOSIS — J439 Emphysema, unspecified: Secondary | ICD-10-CM | POA: Diagnosis not present

## 2024-08-22 DIAGNOSIS — Z85048 Personal history of other malignant neoplasm of rectum, rectosigmoid junction, and anus: Secondary | ICD-10-CM | POA: Diagnosis present

## 2024-08-22 DIAGNOSIS — K56699 Other intestinal obstruction unspecified as to partial versus complete obstruction: Secondary | ICD-10-CM | POA: Diagnosis not present

## 2024-08-22 DIAGNOSIS — I2510 Atherosclerotic heart disease of native coronary artery without angina pectoris: Secondary | ICD-10-CM | POA: Diagnosis not present

## 2024-08-22 DIAGNOSIS — I739 Peripheral vascular disease, unspecified: Secondary | ICD-10-CM | POA: Diagnosis not present

## 2024-08-22 DIAGNOSIS — F17210 Nicotine dependence, cigarettes, uncomplicated: Secondary | ICD-10-CM | POA: Insufficient documentation

## 2024-08-22 DIAGNOSIS — J432 Centrilobular emphysema: Secondary | ICD-10-CM | POA: Diagnosis not present

## 2024-08-22 DIAGNOSIS — F1120 Opioid dependence, uncomplicated: Secondary | ICD-10-CM | POA: Insufficient documentation

## 2024-08-22 DIAGNOSIS — Z933 Colostomy status: Secondary | ICD-10-CM | POA: Insufficient documentation

## 2024-08-22 DIAGNOSIS — R918 Other nonspecific abnormal finding of lung field: Secondary | ICD-10-CM | POA: Diagnosis not present

## 2024-08-22 DIAGNOSIS — I700 Atherosclerosis of aorta: Secondary | ICD-10-CM | POA: Diagnosis not present

## 2024-08-22 LAB — BASIC METABOLIC PANEL - CANCER CENTER ONLY
Anion gap: 9 (ref 5–15)
BUN: 11 mg/dL (ref 8–23)
CO2: 27 mmol/L (ref 22–32)
Calcium: 10.3 mg/dL (ref 8.9–10.3)
Chloride: 105 mmol/L (ref 98–111)
Creatinine: 0.73 mg/dL (ref 0.61–1.24)
GFR, Estimated: >60 mL/min (ref >60)
Glucose, Bld: 89 mg/dL (ref 70–99)
Potassium: 4.9 mmol/L (ref 3.5–5.1)
Sodium: 140 mmol/L (ref 135–145)

## 2024-08-22 MED ORDER — IOHEXOL 300 MG/ML  SOLN
100.0000 mL | Freq: Once | INTRAMUSCULAR | Status: AC | PRN
  Administered 2024-08-22: 12:00:00 80 mL via INTRAVENOUS

## 2024-08-25 ENCOUNTER — Ambulatory Visit: Admitting: Oncology

## 2024-08-25 VITALS — BP 110/66 | HR 80 | Temp 97.8°F | Resp 18 | Ht 71.0 in | Wt 126.1 lb

## 2024-08-25 DIAGNOSIS — C210 Malignant neoplasm of anus, unspecified: Secondary | ICD-10-CM | POA: Diagnosis not present

## 2024-08-25 DIAGNOSIS — Z85048 Personal history of other malignant neoplasm of rectum, rectosigmoid junction, and anus: Secondary | ICD-10-CM | POA: Diagnosis not present

## 2024-08-25 NOTE — Progress Notes (Signed)
 [facility] OFFICE PROGRESS NOTE   Diagnosis: Anal cancer  INTERVAL HISTORY:   Martin [name] returns as scheduled.  The colostomy is functioning well.  He reports intermittent dyspnea.  He continues smoking.  He is weaning off of methadone .  He takes oxycodone  twice daily.  He is followed in the pain clinic.  Objective:  Vital signs in last 24 hours:  Blood pressure 110/66, pulse 80, temperature 97.8 F (36.6 C), temperature source Temporal, resp. rate 18, height 5' 11 (1.803 m), weight 126 lb 1.6 oz (57.2 kg), SpO2 100%.    Lymphatics: No cervical, supraclavicular, axillary, or inguinal nodes Resp: Distant breath sounds with bilateral end inspiratory wheeze/rhonchi, no respiratory distress Cardio: Regular rate and rhythm GI: No hepatosplenomegaly, nontender, no mass, left lower quadrant colostomy with brown stool Vascular: No leg edema Skin: Perineal scar/skin graft without evidence of recurrent tumor   Lab Results:  Lab Results  Component Value Date   WBC 19.9 (H) 07/06/2024   HGB 11.7 (L) 07/06/2024   HCT 34.6 (L) 07/06/2024   MCV 91.3 07/06/2024   PLT 129 (L) 07/06/2024   NEUTROABS 16.7 (H) 07/06/2024    CMP  Lab Results  Component Value Date   NA 140 08/22/2024   K 4.9 08/22/2024   CL 105 08/22/2024   CO2 27 08/22/2024   GLUCOSE 89 08/22/2024   BUN 11 08/22/2024   CREATININE 0.73 08/22/2024   CALCIUM  10.3 08/22/2024   PROT 7.2 07/06/2024   ALBUMIN 4.0 07/06/2024   AST 18 07/06/2024   ALT 12 07/06/2024   ALKPHOS 127 (H) 07/06/2024   BILITOT 0.7 07/06/2024   GFRNONAA >60 08/22/2024   GFRAA >60 03/10/2020    No results found for: CEA1, CEA, CAN199, CA125  Lab Results  Component Value Date   INR 1.1 04/19/2022   LABPROT 13.7 04/19/2022    Imaging:  CT CHEST ABDOMEN PELVIS W CONTRAST Result Date: 08/22/2024 CLINICAL DATA:  Anal cancer surveillance, status post APR * Tracking Code: BO * EXAM: CT CHEST, ABDOMEN, AND PELVIS  WITH CONTRAST TECHNIQUE: Multidetector CT imaging of the chest, abdomen and pelvis was performed following the standard protocol during bolus administration of intravenous contrast. RADIATION DOSE REDUCTION: This exam was performed according to the departmental dose-optimization program which includes automated exposure control, adjustment of the mA and/or kV according to patient size and/or use of iterative reconstruction technique. CONTRAST:  80mL OMNIPAQUE  IOHEXOL  300 MG/ML SOLN additional oral enteric contrast COMPARISON:  CT abdomen pelvis, 01/16/2024 CT chest, 07/06/2024 FINDINGS: CT CHEST FINDINGS Cardiovascular: Scattered aortic atherosclerosis. 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 Normal heart size. Left and right coronary artery calcifications. No pericardial effusion. Mediastinum/Nodes: No enlarged mediastinal, hilar, or axillary lymph nodes. Thyroid gland, trachea, and esophagus demonstrate no significant findings. Lungs/Pleura: Moderate centrilobular and paraseptal emphysema. Diffuse bilateral bronchial wall thickening with bronchiolar plugging scattered throughout the lower lobes. Irregular scarring of the dependent right lung base. Scattered centrilobular ground-glass nodularity in the left lung base (series 3, image 141). Biapical pleural-parenchymal scarring. Interval resolution of previously seen multifocal acute consolidative airspace disease of both upper lobes. Occasional tiny pulmonary nodules unchanged, for example in the peripheral left upper lobe (series 3, image 95). No pleural effusion or pneumothorax. Musculoskeletal: No chest wall abnormality. No acute osseous findings. CT ABDOMEN PELVIS FINDINGS Hepatobiliary: No solid liver abnormality is seen. No gallstones, gallbladder wall thickening, or biliary dilatation. Pancreas: Unremarkable. No pancreatic ductal dilatation or surrounding inflammatory changes. Spleen: Normal in  size without significant abnormality.  Adrenals/Urinary Tract: Adrenal glands are unremarkable. Kidneys are normal, without renal calculi, solid lesion, or hydronephrosis. Bladder is unremarkable. Stomach/Bowel: Stomach is within normal limits. Small diverticulum of the gastric fundus. Status post abdominoperineal resection with left lower quadrant end colostomy. No evidence of bowel wall thickening, distention, or inflammatory changes. Vascular/Lymphatic: Aortic atherosclerosis. Right external iliac artery stenting. No enlarged abdominal or pelvic lymph nodes. Reproductive: No mass or other abnormality. Other: No abdominal wall hernia or abnormality. No ascites. Musculoskeletal: No acute osseous findings. IMPRESSION: 1. Status post abdominoperineal resection with left lower quadrant end colostomy. 2. No evidence of recurrent or metastatic disease in the chest, abdomen, or pelvis. 3. Interval resolution of previously seen multifocal acute consolidative airspace disease of both upper lobes. 4. Diffuse bilateral bronchial wall thickening with bronchiolar plugging scattered throughout the lower lobes. Scattered centrilobular ground-glass nodularity in the left lung base. Findings are most consistent with minimal aspiration. 5. Emphysema. 6. Coronary artery disease. Aortic Atherosclerosis (ICD10-I70.0) and Emphysema (ICD10-J43.9). Electronically Signed   By: Marolyn JONETTA Jaksch M.D.   On: 08/22/2024 15:45    Medications: I have reviewed the patient's current medications.   Assessment/Plan:  Anal cancer Transanal excision of anal mass 10/14/2011, squamous cell carcinoma with positive surgical margins Radiation 11/19/2011 - 01/08/2012 Cycle one 5-FU/Mitomycin -C 07/2012, cycle two 5-FU/Mitomycin -C 12/15/2011 CT abdomen/pelvis 07/11/2023: Poorly defined hyperenhancing soft tissue at the anus, no pathology enlarged lymph nodes PET 12/22/2023: Diffuse nodular thickening with abnormal tracer uptake at the anal canal, right side and anteriorly, no evidence of  metastatic disease, post surgical uptake in the sternum Examination under anesthesia 01/14/2024: Right anterior anal mass extending beyond the sphincter complex anteriorly and laterally, biopsy-invasive squamous of carcinoma 03/02/2024-APR/[facility] at [facility], invasive squamous cell carcinoma, moderately differentiated, 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   Peripheral vascular disease 7.   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   MSSA bacteremia February 2023     Disposition: Martin [name] is in clinical remission from anal cancer.  The surveillance CT showed no evidence of recurrent disease.  The plan is to continue observation.  He will return for an office visit in 3 months.  He continues follow-up in the pain clinic.  Arley Hof, MD  08/25/2024  11:37 AM

## 2024-09-15 ENCOUNTER — Other Ambulatory Visit: Payer: Self-pay

## 2024-09-15 ENCOUNTER — Encounter (HOSPITAL_BASED_OUTPATIENT_CLINIC_OR_DEPARTMENT_OTHER): Payer: Self-pay | Admitting: Emergency Medicine

## 2024-09-15 ENCOUNTER — Emergency Department (HOSPITAL_BASED_OUTPATIENT_CLINIC_OR_DEPARTMENT_OTHER)
Admission: EM | Admit: 2024-09-15 | Discharge: 2024-09-15 | Disposition: A | Discharge status: Home / Self Care | Attending: Emergency Medicine | Admitting: Emergency Medicine

## 2024-09-15 DIAGNOSIS — L03213 Periorbital cellulitis: Secondary | ICD-10-CM | POA: Insufficient documentation

## 2024-09-15 DIAGNOSIS — H5713 Ocular pain, bilateral: Secondary | ICD-10-CM | POA: Insufficient documentation

## 2024-09-15 DIAGNOSIS — Z7902 Long term (current) use of antithrombotics/antiplatelets: Secondary | ICD-10-CM | POA: Insufficient documentation

## 2024-09-15 DIAGNOSIS — Z79899 Other long term (current) drug therapy: Secondary | ICD-10-CM | POA: Diagnosis not present

## 2024-09-15 MED ORDER — FLUORESCEIN SODIUM 1 MG OP STRP
1.0000 | ORAL_STRIP | Freq: Once | OPHTHALMIC | Status: AC
  Administered 2024-09-15: 1 via OPHTHALMIC
  Filled 2024-09-15: qty 1

## 2024-09-15 MED ORDER — TETRACAINE HCL 0.5 % OP SOLN
2.0000 [drp] | Freq: Once | OPHTHALMIC | Status: AC
  Administered 2024-09-15: 2 [drp] via OPHTHALMIC
  Filled 2024-09-15: qty 4

## 2024-09-15 MED ORDER — CEPHALEXIN 250 MG PO CAPS
500.0000 mg | ORAL_CAPSULE | Freq: Once | ORAL | Status: AC
  Administered 2024-09-15: 500 mg via ORAL
  Filled 2024-09-15: qty 2

## 2024-09-15 MED ORDER — CEPHALEXIN 500 MG PO CAPS: 500.0000 mg | ORAL_CAPSULE | Freq: Two times a day (BID) | ORAL | 0 refills | Status: AC

## 2024-09-15 NOTE — ED Provider Notes (Signed)
 " [facility] Provider Note   CSN: [id number] Arrival date & time: 09/15/24  9162     Patient presents with: Eye Problem   Martin [name] is a 64 y.o. male here with bilateral eye pain x 2-3 months.  He says his eyes are burning and hurting, and originally folic something was in his eye, but now it has been she has 1 eye to the next.  He says at times his vision is affected and blurred.  He thought he had a stye in the right eye, and now is having redness and worsening pain on the outside of the right eye.   HPI     Prior to Admission medications  Medication Sig Start Date End Date Taking? Authorizing Provider  cephALEXin  (KEFLEX ) 500 MG capsule Take 1 capsule (500 mg total) by mouth 2 (two) times daily for 7 days. 09/15/24 09/22/24 Yes Daniell Paradise, Donnice JINNY, MD  losartan (COZAAR) 25 MG tablet Take 25 mg by mouth daily. 08/29/24  Yes [provider]  albuterol  (VENTOLIN  HFA) 108 (90 Base) MCG/ACT inhaler Inhale 2 puffs into the lungs every 6 (six) hours as needed for wheezing or shortness of breath. 05/11/23   Chandra Harlene LABOR, NP  atorvastatin  (LIPITOR) 40 MG tablet Take 40 mg by mouth daily.    [provider]  carvedilol (COREG) 3.125 MG tablet Take 3.125 mg by mouth 2 (two) times daily with a meal. 12/23/23   [provider]  cloNIDine  (CATAPRES ) 0.2 MG tablet Take 0.2 mg by mouth at bedtime.    [provider]  clopidogrel  (PLAVIX ) 75 MG tablet Take 1 tablet (75 mg total) by mouth daily. 10/07/22   Brown, Fallon E, NP  EPINEPHrine  (EPIPEN  2-PAK) 0.3 mg/0.3 mL IJ SOAJ injection Inject 0.3 mg (1 pen) into the muscle as needed for anaphylaxis. Patient not taking: Reported on 08/25/2024 12/30/22   Jaycee Greig JINNY, NP  gabapentin  (NEURONTIN ) 100 MG capsule Take 100 mg by mouth 3 (three) times daily. Patient not taking: Reported on 08/25/2024 06/06/24   [provider]  ipratropium-albuterol  (DUONEB) 0.5-2.5 (3)  MG/3ML SOLN Take 3 mLs by nebulization every 4 (four) hours as needed. 07/06/24   Yolande Lamar BROCKS, MD  levocetirizine (XYZAL) 5 MG tablet Take 5 mg by mouth every evening. Patient not taking: Reported on 08/25/2024    [provider]  methadone  (DOLOPHINE ) 1 MG/1ML solution Take 36 mg by mouth daily.    [provider]  methocarbamol  (ROBAXIN ) 500 MG tablet Take 500 mg by mouth every 8 (eight) hours as needed. 06/06/24   [provider]  oxyCODONE -acetaminophen  (PERCOCET) 10-325 MG tablet Take 0.5 tablets by mouth daily as needed for pain. 06/13/24   Cloretta Arley NOVAK, MD  Polyethylene Glycol 3350  (MIRALAX PO) Take 17 g by mouth daily.    [provider]  prochlorperazine  (COMPAZINE ) 5 MG tablet Take 1 tablet (5 mg total) by mouth every 6 (six) hours as needed for nausea or vomiting. 06/13/24   Cloretta Arley NOVAK, MD  SPIRIVA RESPIMAT 2.5 MCG/ACT AERS Inhale 2 puffs into the lungs daily. 06/02/23   [provider]  tamsulosin  (FLOMAX ) 0.4 MG CAPS capsule Take 1 capsule (0.4 mg total) by mouth at bedtime. 09/16/22   Tanda Bleacher, MD  traZODone  (DESYREL ) 50 MG tablet Take 50 mg by mouth at bedtime. 08/04/23   [provider]  Vitamin D , Cholecalciferol, 25 MCG (1000 UT) TABS Take 1,000 Units by mouth daily.  [provider]    Allergies: Aspirin , Yellow jacket venom [bee venom], Zolpidem , Codeine, Ibuprofen , and Morphine  and codeine    Review of Systems  Updated Vital Signs BP 121/75 (BP Location: Right Arm)   Pulse 78   Temp 97.8 F (36.6 C) (Oral)   Resp 18   SpO2 100%   Physical Exam Constitutional:      General: He is not in acute distress. HENT:     Head: Normocephalic and atraumatic.  Eyes:     Comments: Preseptal erythema of the right eye, without fluctuance or purulence; Conjunctiva the bilateral eyes are clear, pupils are equal and reactive bilaterally, no glassy, steamy and red eye Fluorescein  staining with no  abrasions or abnormal fluorescein  uptake or abnormal Seidel sign; patient does report relief with tetracaine  drops  Cardiovascular:     Rate and Rhythm: Normal rate and regular rhythm.  Pulmonary:     Effort: Pulmonary effort is normal. No respiratory distress.  Skin:    General: Skin is warm and dry.  Neurological:     General: No focal deficit present.     Mental Status: He is alert. Mental status is at baseline.  Psychiatric:        Mood and Affect: Mood normal.        Behavior: Behavior normal.     (all labs ordered are listed, but only abnormal results are displayed) Labs Reviewed - No data to display  EKG: None  Radiology: No results found.   Procedures   Medications Ordered in the ED  cephALEXin  (KEFLEX ) capsule 500 mg (has no administration in time range)  tetracaine  (PONTOCAINE) 0.5 % ophthalmic solution 2 drop (2 drops Both Eyes Given 09/15/24 0914)  fluorescein  ophthalmic strip 1 strip (1 strip Both Eyes Given 09/15/24 0915)                                    Medical Decision Making Risk Prescription drug management.   Patient presenting with redness and pain in bilateral eyes.  It does look like he may have developed a mild preseptal cellulitis of the right eye, possibly related initially to a stye.  I will put him on an antibiotic for this.  I do not see evidence of bacterial conjunctivitis.  Low suspicion for acute angle-closure glaucoma.  I do think with 2 to 3 months of worsening eye pain and intermittent blurred vision, a referral to ophthalmology and evaluation by ophthalmologist would be quite reasonable.     Final diagnoses:  Eye pain, bilateral  Preseptal cellulitis    ED Discharge Orders          Ordered    cephALEXin  (KEFLEX ) 500 MG capsule  2 times daily        09/15/24 0945               Cottie Donnice PARAS, MD 09/15/24 [phone]  "

## 2024-09-15 NOTE — ED Triage Notes (Signed)
 Reports bilateral eye irritation x  months Seen at PCP for same but no relief,

## 2024-09-15 NOTE — Discharge Instructions (Addendum)
 Eye Doctor - Call for Appointment  Martin Charlie Hamilton, MD [address] [place] [address] [place] [address]   [phone]

## 2024-10-04 NOTE — Progress Notes (Signed)
 "     Cardiology Office Note Date:  10/07/2024  ID:  Martin [name], DOB [birth year], MRN [id number] PCP:  Leontine Cramp, NP  Cardiologist:  Joelle VEAR Ren Donley, MD  Chief Complaint  Patient presents with   peripheral arterial disease     Problems AI s/p bioprosthetic AVR (23 mm Edwards Inspiris Resilia) + root patch enlargement (2/25) Bradycardia post op Hfrecovered EF  TTE 5/25 40-45% pre AVR --> 60-65% CAD LHC 1/25: 20% prox LAD, 15% prox RCA PAD PCI left popliteal, left profunda, left SFA within previously placed stent, left peroneal 2/24 COPD Tobacco use 54 pack year Aspirin  allergy Recurrent anal cancer Chronic pain on methadone  M: AN40, CL3.125, CL75, SE25  Visits  1/26: LP, AAA screening, pulm, referral    Discussed the use of AI scribe software for clinical note transcription with the patient, who gave verbal consent to proceed.  History of Present Illness Martin [name] is a 63 year old male with COPD who presents with chest pain and shortness of breath. He experienced significant chest pain last night, which has since subsided and denies any baseline angina. He has a history of heart valve repair and has not been seen by a cardiologist recently due to insurance changes. He has chronic shortness of breath that worsens with physical activity. 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 He uses a rescue inhaler, which provides short-term relief. He has not seen a pulmonologist recently. He smokes about half a pack of cigarettes per day and has struggled to quit despite previous attempts using gum and patches, which were ineffective or unaffordable. He quit for a couple of months after heart surgery but resumed smoking. No leg swelling is present, and he currently uses one pillow at night, having previously used two. He used to experience calf pain while walking but does not currently. There is no family history  of heart disease. His mother was recently diagnosed with pneumonia.   ROS: Please see the history of present illness. All other systems are reviewed and negative.   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    Anxiety    Arthritis    DDD lumbar, arthritis knees   Complication of anesthesia    pt states he woke up during a vascular procedure   COPD (chronic obstructive pulmonary disease) (HCC)    DJD (degenerative joint disease) of lumbar spine    GERD (gastroesophageal reflux disease)    Hemorrhoid    internal   History of bowel resection 04/19/2017   Hyperlipidemia    Hypertension    Pt states he's never been told or treated for HTN   Illicit drug use    + UDS cocaine, THC, opiates 10/11/21   Inguinal hernia    MSSA bacteremia 10/11/2021   s/p IV cefazolin , po Linezolid ; 10/16/21 TEE w/o vegetations.   Peripheral vascular disease    Pneumonia    as child, cough at present with no fever   Radiation 11/19/11-01/08/12   5040 cGy 28 fx Pelvis and inguinal area   Stroke [facility])    TIA,  no weakness or paralysis   Substance abuse (HCC)    last use was Jan. 2024; patient states he has been clean since then    Past Surgical History:  Procedure Laterality Date   APPENDECTOMY  age 42 or 84   CARPAL TUNNEL RELEASE Right 02/04/2023   Procedure: RIGHT CARPAL TUNNEL RELEASE;  Surgeon: Jerri Kay HERO, MD;  Location: [facility];  Service: Orthopedics;  Laterality: Right;   EXAMINATION UNDER ANESTHESIA  10/14/2011   Procedure: EXAM UNDER ANESTHESIA;  Surgeon: Krystal HERO Spinner, MD;  Location: [facility];  Service: General;  Laterality: N/A;  exam under anethesia, Excision of mass anal canal, 1.5cm   INGUINAL HERNIA REPAIR  05/11/2012   Procedure: HERNIA REPAIR INGUINAL ADULT;  Surgeon: Krystal HERO Spinner, MD;  Location: [facility];  Service: General;  Laterality: Left;  left inguinal  hernia repair with mesh   INGUINAL HERNIA REPAIR N/A    with mesh   LOWER EXTREMITY ANGIOGRAPHY Left 07/02/2021   Procedure: LOWER EXTREMITY ANGIOGRAPHY;  Surgeon: Jama Cordella MATSU, MD;  Location: [facility];  Service: Cardiovascular;  Laterality: Left;   LOWER EXTREMITY ANGIOGRAPHY Right 07/16/2021   Procedure: LOWER EXTREMITY ANGIOGRAPHY;  Surgeon: Jama Cordella MATSU, MD;  Location: [facility];  Service: Cardiovascular;  Laterality: Right;   LOWER EXTREMITY ANGIOGRAPHY Left 08/27/2021   Procedure: LOWER EXTREMITY ANGIOGRAPHY;  Surgeon: Jama Cordella MATSU, MD;  Location: [facility];  Service: Cardiovascular;  Laterality: Left;   LOWER EXTREMITY ANGIOGRAPHY Left 01/14/2022   Procedure: Lower Extremity Angiography;  Surgeon: Jama Cordella MATSU, MD;  Location: [facility];  Service: Cardiovascular;  Laterality: Left;   LOWER EXTREMITY ANGIOGRAPHY Left 11/04/2022   Procedure: Lower Extremity Angiography;  Surgeon: Jama Cordella MATSU, MD;  Location: [facility];  Service: Cardiovascular;  Laterality: Left;   RECTAL BIOPSY N/A 01/14/2024   Procedure: CORE NEEDLE BIOPSY ANAL MASS;  Surgeon: Debby Hila, MD;  Location: [facility];  Service: General;  Laterality: N/A;   RECTAL EXAM UNDER ANESTHESIA N/A 01/14/2024   Procedure: EXAM UNDER ANESTHESIA, RECTUM;  Surgeon: Debby Hila, MD;  Location: [facility];  Service: General;  Laterality: N/A;   replace aortic value     SMALL INTESTINE SURGERY  04/19/2017   exploratory lap, partial small bowel resection for perforated ulcer/abcess, radiation enteritis   surgical pathology   10/14/2011   squamous cell ca of anus   TEE WITHOUT CARDIOVERSION N/A 10/16/2021   Procedure: TRANSESOPHAGEAL ECHOCARDIOGRAM (TEE);  Surgeon: Mona Vinie BROCKS, MD;  Location: [facility];  Service: Cardiovascular;  Laterality: N/A;   TOOTH EXTRACTION N/A 12/04/2021   Procedure: DENTAL RESTORATION/EXTRACTIONS;  Surgeon: Sheryle Hamilton,  DMD;  Location: [facility];  Service: Oral Surgery;  Laterality: N/A;   transanal excision  10/14/2011   Dr.Todd Gerkin    Current Outpatient Medications  Medication Sig Dispense Refill   albuterol  (VENTOLIN  HFA) 108 (90 Base) MCG/ACT inhaler Inhale 2 puffs into the lungs every 6 (six) hours as needed for wheezing or shortness of breath. 18 g 0   atorvastatin  (LIPITOR) 40 MG tablet Take 40 mg by mouth daily.     carvedilol (COREG) 3.125 MG tablet Take 3.125 mg by mouth 2 (two) times daily with a meal.     cloNIDine  (CATAPRES ) 0.2 MG tablet Take 0.2 mg by mouth at bedtime.     clopidogrel  (PLAVIX ) 75 MG tablet Take 1 tablet (75 mg total) by mouth daily. 30 tablet 11   ipratropium-albuterol  (DUONEB) 0.5-2.5 (3) MG/3ML SOLN Take 3 mLs by nebulization every 4 (four) hours as needed. 360 mL 2   losartan (COZAAR) 25 MG tablet Take 25 mg by mouth daily.  methadone  (DOLOPHINE ) 1 MG/1ML solution Take 36 mg by mouth daily.     methocarbamol  (ROBAXIN ) 500 MG tablet Take 500 mg by mouth every 8 (eight) hours as needed.     oxyCODONE -acetaminophen  (PERCOCET) 10-325 MG tablet Take 0.5 tablets by mouth daily as needed for pain. 5 tablet 0   Polyethylene Glycol 3350  (MIRALAX PO) Take 17 g by mouth daily.     prochlorperazine  (COMPAZINE ) 5 MG tablet Take 1 tablet (5 mg total) by mouth every 6 (six) hours as needed for nausea or vomiting. 30 tablet 2   SPIRIVA  RESPIMAT 2.5 MCG/ACT AERS Inhale 2 puffs into the lungs daily.     tamsulosin  (FLOMAX ) 0.4 MG CAPS capsule Take 1 capsule (0.4 mg total) by mouth at bedtime. 90 capsule 1   traZODone  (DESYREL ) 50 MG tablet Take 50 mg by mouth at bedtime.     Vitamin D , Cholecalciferol, 25 MCG (1000 UT) TABS Take 1,000 Units by mouth daily.     No current facility-administered medications for this visit.    Allergies:   Aspirin , Yellow jacket venom [bee venom], Zolpidem , Codeine, Ibuprofen , and Morphine  and codeine   Social History:  see above  Family History:  see  above  PHYSICAL EXAM: VS:  BP 136/82 (BP Location: Left Arm, Patient Position: Sitting, Cuff Size: Normal)   Pulse (!) 57   Ht 5' 11 (1.803 m)   Wt 123 lb 11.2 oz (56.1 kg)   SpO2 99%   BMI 17.25 kg/m  , BMI Body mass index is 17.25 kg/m. GEN: Well nourished, well developed, in no acute distress HEENT: normal Neck: no JVD, carotid bruits, or masses Cardiac: RRR; no murmurs, rubs, or gallops,no edema  Respiratory:  CTAB bilaterally, normal work of breathing GI: soft, nontender, nondistended, + BS Extremities: No LE edema Skin: warm and dry, no rash Neuro:  Strength and sensation are intact  EKG: Sinus bradycardia  Recent Labs: Reviewed  Studies: Reviewed Assessment & Plan  Chronic obstructive pulmonary disease with emphysema Chronic dyspnea exacerbated by activity, possibly worsened by environmental factors and potential pneumonia exposure. Rescue inhaler provides short-term relief. No recent pulmonology follow-up. - Referred to pulmonologist for evaluation and potential adjustment of inhaler therapy. - Ordered ultrasound of the aorta due to smoking history.  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 Previous stent placement. On Plavix  due to bleeding issues with Eliquis  and aspirin  allergy. - Continue Plavix  due to bleeding issues with Eliquis  and aspirin  allergy.  Hyperlipidemia Cholesterol management needed to reduce cardiovascular risk. - Ordered cholesterol test.  Tobacco use disorder Continues to smoke half a pack or more per day. Previous cessation attempts with gum and patches failed due to cost and dental issues. Cessation crucial for health improvement. - Encouraged smoking cessation and discussed potential medication options for next visit.  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   Signed, Joelle VEAR Ren Donley, MD  10/07/2024 10:24 AM    [facility] "

## 2024-10-05 DIAGNOSIS — I509 Heart failure, unspecified: Secondary | ICD-10-CM | POA: Insufficient documentation

## 2024-10-05 DIAGNOSIS — M546 Pain in thoracic spine: Secondary | ICD-10-CM | POA: Insufficient documentation

## 2024-10-05 DIAGNOSIS — F17210 Nicotine dependence, cigarettes, uncomplicated: Secondary | ICD-10-CM | POA: Insufficient documentation

## 2024-10-05 DIAGNOSIS — R0602 Shortness of breath: Secondary | ICD-10-CM | POA: Insufficient documentation

## 2024-10-07 ENCOUNTER — Ambulatory Visit

## 2024-10-07 VITALS — BP 136/82 | HR 57 | Ht 71.0 in | Wt 123.7 lb

## 2024-10-07 DIAGNOSIS — Z720 Tobacco use: Secondary | ICD-10-CM

## 2024-10-07 DIAGNOSIS — E785 Hyperlipidemia, unspecified: Secondary | ICD-10-CM

## 2024-10-07 DIAGNOSIS — I739 Peripheral vascular disease, unspecified: Secondary | ICD-10-CM

## 2024-10-07 DIAGNOSIS — J439 Emphysema, unspecified: Secondary | ICD-10-CM

## 2024-10-07 DIAGNOSIS — I70213 Atherosclerosis of native arteries of extremities with intermittent claudication, bilateral legs: Secondary | ICD-10-CM | POA: Diagnosis not present

## 2024-10-07 DIAGNOSIS — Z952 Presence of prosthetic heart valve: Secondary | ICD-10-CM

## 2024-10-07 DIAGNOSIS — C210 Malignant neoplasm of anus, unspecified: Secondary | ICD-10-CM | POA: Diagnosis not present

## 2024-10-07 LAB — LIPID PANEL
Chol/HDL Ratio: 2.4 ratio (ref 0.0–5.0)
Cholesterol, Total: 111 mg/dL (ref 100–199)
HDL: 47 mg/dL
LDL Chol Calc (NIH): 49 mg/dL (ref 0–99)
Triglycerides: 72 mg/dL (ref 0–149)
VLDL Cholesterol Cal: 15 mg/dL (ref 5–40)

## 2024-10-07 NOTE — Patient Instructions (Addendum)
 Medication Instructions:  NO CHANGES *If you need a refill on your cardiac medications before your next appointment, please call your pharmacy*  Lab Work: FASTING LIPID PANEL TODAY If you have labs (blood work) drawn today and your tests are completely normal, you will receive your results only by: MyChart Message (if you have MyChart) OR A paper copy in the mail If you have any lab test that is abnormal or we need to change your treatment, we will call you to review the results.  Testing/Procedures: Your physician has requested that you have an AAA duplex. Ultrasound will be performed at [address]  Follow-Up: At [facility], you and your health needs are our priority.  As part of our continuing mission to provide you with exceptional heart care, our providers are all part of one team.  This team includes your primary Cardiologist (physician) and Advanced Practice Providers or APPs (Physician Assistants and Nurse Practitioners) who all work together to provide you with the care you need, when you need it.  Your next appointment:   6 month(s)  Provider:   Joelle VEAR Ren Donley, MD   We recommend signing up for the patient portal called MyChart.  Sign up information is provided on this After Visit Summary.  MyChart is used to connect with patients for Virtual Visits (Telemedicine).  Patients are able to view lab/test results, encounter notes, upcoming appointments, etc.  Non-urgent messages can be sent to your provider as well.   To learn more about what you can do with MyChart, go to [email or web address].   Other Instructions You have been referred to Pulmonology for COPD.

## 2024-10-08 ENCOUNTER — Ambulatory Visit: Payer: Self-pay

## 2024-10-11 NOTE — Progress Notes (Signed)
 "  New Patient Pulmonology Office Visit   Subjective:  Patient ID: Martin [name], male    DOB: April 17, 1961  MRN: [id number]  Referred by: Ren Martin Joelle VEAR, MD  CC: No chief complaint on file.   HPI Martin [name] is a 64 y.o. 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    Anxiety    Arthritis    DDD lumbar, arthritis knees   Complication of anesthesia    pt states he woke up during a vascular procedure   COPD (chronic obstructive pulmonary disease) (HCC)    DJD (degenerative joint disease) of lumbar spine    GERD (gastroesophageal reflux disease)    Hemorrhoid    internal   History of bowel resection 04/19/2017   Hyperlipidemia    Hypertension    Pt states he's never been told or treated for HTN   Illicit drug use    + UDS cocaine, THC, opiates 10/11/21   Inguinal hernia    MSSA bacteremia 10/11/2021   s/p IV cefazolin , po Linezolid ; 10/16/21 TEE w/o vegetations.   Peripheral vascular disease    Pneumonia    as child, cough at present with no fever   Radiation 11/19/11-01/08/12   5040 cGy 28 fx Pelvis and inguinal area   Stroke [facility])    TIA,  no weakness or paralysis   Substance abuse (HCC)    last use was Jan. 2024; patient states he has been clean since then   Past Surgical History:  Procedure Laterality Date   APPENDECTOMY     age 43 or 89   CARPAL TUNNEL RELEASE Right 02/04/2023   Procedure: RIGHT CARPAL TUNNEL RELEASE;  Surgeon: Martin Kay HERO, MD;  Location: [facility];  Service: Orthopedics;  Laterality: Right;   EXAMINATION UNDER ANESTHESIA   10/14/2011   Procedure: EXAM UNDER ANESTHESIA;  Surgeon: Martin [name] Spinner, MD;  Location: [facility];  Service: General;  Laterality: N/A;  exam under anethesia, Excision of mass anal canal, 1.5cm   INGUINAL HERNIA REPAIR  05/11/2012   Procedure: HERNIA REPAIR INGUINAL ADULT;  Surgeon: Martin [name] Spinner, MD;  Location: [facility];  Service: General;  Laterality: Left;  left inguinal hernia repair with mesh   INGUINAL HERNIA REPAIR N/A    with mesh   LOWER EXTREMITY ANGIOGRAPHY Left 07/02/2021   Procedure: LOWER EXTREMITY ANGIOGRAPHY;  Surgeon: Martin Cordella MATSU, MD;  Location: [facility];  Service: Cardiovascular;  Laterality: Left;   LOWER EXTREMITY ANGIOGRAPHY Right 07/16/2021   Procedure: LOWER EXTREMITY ANGIOGRAPHY;  Surgeon: Martin Cordella MATSU, MD;  Location: [facility];  Service: Cardiovascular;  Laterality: Right;   LOWER EXTREMITY ANGIOGRAPHY Left 08/27/2021   Procedure: LOWER EXTREMITY ANGIOGRAPHY;  Surgeon: Martin Cordella MATSU, MD;  Location: [facility];  Service: Cardiovascular;  Laterality: Left;   LOWER EXTREMITY ANGIOGRAPHY Left 01/14/2022   Procedure: Lower Extremity Angiography;  Surgeon: Martin Cordella MATSU, MD;  Location: [facility];  Service: Cardiovascular;  Laterality: Left;   LOWER EXTREMITY ANGIOGRAPHY Left 11/04/2022   Procedure: Lower Extremity Angiography;  Surgeon: Martin,  Cordella MATSU, MD;  Location: [facility];  Service: Cardiovascular;  Laterality: Left;   RECTAL BIOPSY N/A 01/14/2024   Procedure: CORE NEEDLE BIOPSY ANAL MASS;  Surgeon: Martin Hila, MD;  Location: [facility];  Service: General;  Laterality: N/A;   RECTAL EXAM UNDER ANESTHESIA N/A 01/14/2024   Procedure: EXAM UNDER ANESTHESIA, RECTUM;  Surgeon: Martin Hila, MD;  Location: [facility];  Service: General;  Laterality: N/A;   replace aortic value     SMALL INTESTINE SURGERY  04/19/2017   exploratory lap, partial small bowel resection for perforated  ulcer/abcess, radiation enteritis   surgical pathology   10/14/2011   squamous cell ca of anus   TEE WITHOUT CARDIOVERSION N/A 10/16/2021   Procedure: TRANSESOPHAGEAL ECHOCARDIOGRAM (TEE);  Surgeon: Martin Vinie BROCKS, MD;  Location: [facility];  Service: Cardiovascular;  Laterality: N/A;   TOOTH EXTRACTION N/A 12/04/2021   Procedure: DENTAL RESTORATION/EXTRACTIONS;  Surgeon: Martin [name], DMD;  Location: [facility];  Service: Oral Surgery;  Laterality: N/A;   transanal excision  10/14/2011   Dr.Todd [name]   Family History  Problem Relation Age of Onset   Colon cancer Father    Asthma Father    Cancer Father        prostate   Hypertension Father    Asthma Mother    Hypertension Mother    Stomach cancer Neg Hx    Esophageal cancer Neg Hx    Social History   Socioeconomic History   Marital status: Widowed    Spouse name: Not on file   Number of children: 3   Years of education: Not on file   Highest education level: Not on file  Occupational History   Occupation: unemployed  Tobacco Use   Smoking status: Every Day    Current packs/day: 1.00    Average packs/day: 1 pack/day for 47.0 years (47.0 ttl pk-yrs)    Types: Cigarettes   Smokeless tobacco: Never   Tobacco comments:    Cutting back>> Quit Smart card supplied  Vaping Use   Vaping status: Never Used  Substance and Sexual Activity   Alcohol use: Yes    Alcohol/week: 1.0 standard drink of alcohol    Types: 1 Cans of beer per week    Comment: occasional   Drug use: Not Currently    Types: Marijuana, Crack cocaine    Comment: hx cocaine use- states stopped 6 months ago but last positive drug screen 09/2022   Sexual activity: Yes    Birth control/protection: Condom  Other Topics Concern   Not on file  Social History Narrative   Not on file   Social Drivers of Health   Tobacco Use: High Risk (10/07/2024)   Patient History    Smoking Tobacco Use: Every Day    Smokeless Tobacco Use: Never    Passive Exposure:  Not on file  Financial Resource Strain: Low Risk (03/04/2024)   Received from [facility]   Overall Financial Resource Strain (CARDIA)    How hard is it for you to pay for the very basics like food, housing, medical care, and heating?: Not very hard  Food Insecurity: No Food Insecurity (02/22/2024)   Received from [facility]   Epic    Within the past 12 months, you worried that your food would run out before you got the money to buy more.: Never true    Within the past 12 months, the food you bought just didn't last and you didn't have money to get  more.: Never true  Recent Concern: Food Insecurity - Food Insecurity Present (02/03/2024)   Hunger Vital Sign    Worried About Running Out of Food in the Last Year: Sometimes true    Ran Out of Food in the Last Year: Sometimes true  Transportation Needs: No Transportation Needs (02/22/2024)   Received from [facility]   PRAPARE - Transportation    Lack of Transportation (Medical): No    Lack of Transportation (Non-Medical): No  Physical Activity: Not on file  Stress: No Stress Concern Present (10/18/2023)   Received from [facility] Occupational [facility] - Occupational Stress Questionnaire    Feeling of Stress : Not at all  Recent Concern: Stress - Stress Concern Present (09/22/2023)   Received from [facility] of Occupational Health - Occupational Stress Questionnaire    Feeling of Stress : To some extent  Social Connections: Not on file  Intimate Partner Violence: Not At Risk (03/29/2024)   Received from [facility]   Epic    Within the last year, have you been afraid of your partner or ex-partner?: No    Within the last year, have you been humiliated or emotionally abused in other ways by your partner or ex-partner?: No    Within the last year, have you been kicked, hit, slapped, or otherwise physically hurt by your partner or ex-partner?: No    Within the last year, have you been  raped or forced to have any kind of sexual activity by your partner or ex-partner?: No  Depression (PHQ2-9): Low Risk (08/25/2024)   Depression (PHQ2-9)    PHQ-2 Score: 0  Alcohol Screen: Not on file  Housing: High Risk (12/14/2023)   Housing Stability Vital Sign    Unable to Pay for Housing in the Last Year: No    Number of Times Moved in the Last Year: Not on file    Homeless in the Last Year: Yes  Utilities: Low Risk (02/22/2024)   Received from [facility]   Utilities    Within the past 12 months, have you been unable to get utilities(heat, electricity) when it was really needed?: No  Health Literacy: Not on file       Objective:  There were no vitals taken for this visit. {Pulm Vitals (Optional):32837}  Physical Exam  Diagnostic Review:  {Labs (Optional):32838}    CT chest 08/22/2024 1. Status post abdominoperineal resection with left lower quadrant end colostomy. 2. No evidence of recurrent or metastatic disease in the chest, abdomen, or pelvis. 3. Interval resolution of previously seen multifocal acute consolidative airspace disease of both upper lobes. 4. Diffuse bilateral bronchial wall thickening with bronchiolar plugging scattered throughout the lower lobes. Scattered centrilobular ground-glass nodularity in the left lung base. Findings are most consistent with minimal aspiration. 5. Emphysema. 6. Coronary artery disease.  Echo 10/2021 EF 55-60%. LV no RWMA.  RV is normal. LA moderate dilated Mild MR, AR moderate  Assessment & Plan:   Assessment & Plan   Lung cancer screening pft No follow-ups on file.    Marny Patch, MD Pulmonary and Critical Care Medicine [facility]    [1]  Current Outpatient Medications:    albuterol  (VENTOLIN  HFA) 108 (90 Base) MCG/ACT inhaler, Inhale 2 puffs into the lungs every 6 (six) hours as needed for wheezing or shortness of breath., Disp: 18 g, Rfl: 0   atorvastatin  (LIPITOR) 40 MG tablet, Take  40 mg  by mouth daily., Disp: , Rfl:    carvedilol (COREG) 3.125 MG tablet, Take 3.125 mg by mouth 2 (two) times daily with a meal., Disp: , Rfl:    cloNIDine  (CATAPRES ) 0.2 MG tablet, Take 0.2 mg by mouth at bedtime., Disp: , Rfl:    clopidogrel  (PLAVIX ) 75 MG tablet, Take 1 tablet (75 mg total) by mouth daily., Disp: 30 tablet, Rfl: 11   ipratropium-albuterol  (DUONEB) 0.5-2.5 (3) MG/3ML SOLN, Take 3 mLs by nebulization every 4 (four) hours as needed., Disp: 360 mL, Rfl: 2   losartan (COZAAR) 25 MG tablet, Take 25 mg by mouth daily., Disp: , Rfl:    methadone  (DOLOPHINE ) 1 MG/1ML solution, Take 36 mg by mouth daily., Disp: , Rfl:    methocarbamol  (ROBAXIN ) 500 MG tablet, Take 500 mg by mouth every 8 (eight) hours as needed., Disp: , Rfl:    oxyCODONE -acetaminophen  (PERCOCET) 10-325 MG tablet, Take 0.5 tablets by mouth daily as needed for pain., Disp: 5 tablet, Rfl: 0   Polyethylene Glycol 3350  (MIRALAX PO), Take 17 g by mouth daily., Disp: , Rfl:    prochlorperazine  (COMPAZINE ) 5 MG tablet, Take 1 tablet (5 mg total) by mouth every 6 (six) hours as needed for nausea or vomiting., Disp: 30 tablet, Rfl: 2   SPIRIVA  RESPIMAT 2.5 MCG/ACT AERS, Inhale 2 puffs into the lungs daily., Disp: , Rfl:    tamsulosin  (FLOMAX ) 0.4 MG CAPS capsule, Take 1 capsule (0.4 mg total) by mouth at bedtime., Disp: 90 capsule, Rfl: 1   traZODone  (DESYREL ) 50 MG tablet, Take 50 mg by mouth at bedtime., Disp: , Rfl:    Vitamin D , Cholecalciferol, 25 MCG (1000 UT) TABS, Take 1,000 Units by mouth daily., Disp: , Rfl:   "

## 2024-10-12 ENCOUNTER — Ambulatory Visit

## 2024-10-12 VITALS — BP 136/83 | HR 67 | Temp 97.8°F | Ht 71.0 in | Wt 125.2 lb

## 2024-10-12 DIAGNOSIS — F17210 Nicotine dependence, cigarettes, uncomplicated: Secondary | ICD-10-CM

## 2024-10-12 DIAGNOSIS — J449 Chronic obstructive pulmonary disease, unspecified: Secondary | ICD-10-CM

## 2024-10-12 MED ORDER — SPIRIVA RESPIMAT 2.5 MCG/ACT IN AERS: 2.0000 | INHALATION_SPRAY | Freq: Every day | RESPIRATORY_TRACT | 6 refills | Status: AC

## 2024-10-12 MED ORDER — NICOTINE 14 MG/24HR TD PT24: 14.0000 mg | MEDICATED_PATCH | Freq: Every day | TRANSDERMAL | 1 refills | Status: AC

## 2024-10-12 MED ORDER — FLUTICASONE-SALMETEROL 250-50 MCG/ACT IN AEPB: 1.0000 | INHALATION_SPRAY | Freq: Two times a day (BID) | RESPIRATORY_TRACT | 6 refills | Status: AC

## 2024-10-12 MED ORDER — NICOTINE POLACRILEX 2 MG MT GUM: 2.0000 mg | CHEWING_GUM | OROMUCOSAL | 1 refills | Status: AC

## 2024-10-12 NOTE — Patient Instructions (Addendum)
 Dear Mr. Derden;  I will recommend the following for your COPD:  -Inhalers: Daily inhaler: Advair 1 puff twice a day, Rinse your mouth after each use.            Spiriva : 2 puffs in the morning daily.  -For mucus clearance: Exercise is really important. I am glad you keep yourself active. Use Duoneb nebulizer twice a day to help to clear the mucus or as need it.  -For quitting smoking: Call this number of obtain nicotine  patch, gum to help with craving. [facility]: Call [phone] or visit the [facility] website to register for, coaching and, receive, free, starter kits of nicotine  replacement therapy. Use nicotine  patch 14g, and gum/lozenges 2g every 2-4 hours as need it for craving. I highly recommend you to quit smoking over time by decreasing the number of cigarettes over time.  [facility] INSTRUCTIONS: To use the inhaler you follow these steps: Prime the inhaler according to instructions (which means waste between 1-4 doses before next use).  Follow package insert Shake the inhaler before each use Exhale completely by blowing all the air out of your lungs Seal your mouth around the inhaler Press down on the canister then inhale SLOW and STEADY until your fill your lungs completely. Hold the breath for 6-10 seconds Gently exhale Wait 60 seconds then repeat steps 2-8. Rinse the mouth after each use to prevent thrush  Your pharmacist can also review proper technique if you have any remaining questions. Let me know if the cost is too high, your insurance may be able to recommend a more affordable option for you.

## 2024-10-19 ENCOUNTER — Ambulatory Visit (HOSPITAL_COMMUNITY)

## 2024-11-03 ENCOUNTER — Ambulatory Visit: Admitting: Nurse Practitioner

## 2024-11-23 ENCOUNTER — Inpatient Hospital Stay: Admitting: Oncology

## 2025-01-09 ENCOUNTER — Encounter

## 2025-01-09 ENCOUNTER — Ambulatory Visit
